# Patient Record
Sex: Male | Born: 1960
Health system: Southern US, Community
[De-identification: ages and names within clinical notes are randomized; demographics above are authoritative.]

## PROBLEM LIST (undated history)

## (undated) DIAGNOSIS — I459 Conduction disorder, unspecified: Secondary | ICD-10-CM

## (undated) DIAGNOSIS — H269 Unspecified cataract: Secondary | ICD-10-CM

## (undated) DIAGNOSIS — J4520 Mild intermittent asthma, uncomplicated: Secondary | ICD-10-CM

## (undated) DIAGNOSIS — N419 Inflammatory disease of prostate, unspecified: Secondary | ICD-10-CM

## (undated) DIAGNOSIS — R112 Nausea with vomiting, unspecified: Secondary | ICD-10-CM

## (undated) DIAGNOSIS — Z950 Presence of cardiac pacemaker: Secondary | ICD-10-CM

## (undated) DIAGNOSIS — E119 Type 2 diabetes mellitus without complications: Secondary | ICD-10-CM

## (undated) DIAGNOSIS — Z8719 Personal history of other diseases of the digestive system: Secondary | ICD-10-CM

## (undated) DIAGNOSIS — T7840XA Allergy, unspecified, initial encounter: Secondary | ICD-10-CM

## (undated) DIAGNOSIS — Z87442 Personal history of urinary calculi: Secondary | ICD-10-CM

## (undated) DIAGNOSIS — N189 Chronic kidney disease, unspecified: Secondary | ICD-10-CM

## (undated) DIAGNOSIS — N184 Chronic kidney disease, stage 4 (severe): Secondary | ICD-10-CM

## (undated) DIAGNOSIS — F209 Schizophrenia, unspecified: Secondary | ICD-10-CM

## (undated) DIAGNOSIS — R0989 Other specified symptoms and signs involving the circulatory and respiratory systems: Secondary | ICD-10-CM

## (undated) DIAGNOSIS — I2510 Atherosclerotic heart disease of native coronary artery without angina pectoris: Secondary | ICD-10-CM

## (undated) DIAGNOSIS — G629 Polyneuropathy, unspecified: Secondary | ICD-10-CM

## (undated) DIAGNOSIS — E782 Mixed hyperlipidemia: Secondary | ICD-10-CM

## (undated) DIAGNOSIS — R600 Localized edema: Secondary | ICD-10-CM

## (undated) DIAGNOSIS — F419 Anxiety disorder, unspecified: Secondary | ICD-10-CM

## (undated) DIAGNOSIS — I4892 Unspecified atrial flutter: Secondary | ICD-10-CM

## (undated) DIAGNOSIS — I219 Acute myocardial infarction, unspecified: Secondary | ICD-10-CM

## (undated) DIAGNOSIS — D649 Anemia, unspecified: Secondary | ICD-10-CM

## (undated) DIAGNOSIS — I495 Sick sinus syndrome: Secondary | ICD-10-CM

## (undated) DIAGNOSIS — I447 Left bundle-branch block, unspecified: Secondary | ICD-10-CM

## (undated) DIAGNOSIS — J449 Chronic obstructive pulmonary disease, unspecified: Secondary | ICD-10-CM

## (undated) DIAGNOSIS — F329 Major depressive disorder, single episode, unspecified: Secondary | ICD-10-CM

## (undated) DIAGNOSIS — F32A Depression, unspecified: Secondary | ICD-10-CM

## (undated) DIAGNOSIS — Z9889 Other specified postprocedural states: Secondary | ICD-10-CM

## (undated) DIAGNOSIS — K219 Gastro-esophageal reflux disease without esophagitis: Secondary | ICD-10-CM

## (undated) DIAGNOSIS — L509 Urticaria, unspecified: Secondary | ICD-10-CM

## (undated) DIAGNOSIS — J069 Acute upper respiratory infection, unspecified: Secondary | ICD-10-CM

## (undated) DIAGNOSIS — I10 Essential (primary) hypertension: Secondary | ICD-10-CM

## (undated) DIAGNOSIS — Z45018 Encounter for adjustment and management of other part of cardiac pacemaker: Secondary | ICD-10-CM

## (undated) HISTORY — DX: Unspecified atrial flutter: I48.92

## (undated) HISTORY — DX: Chronic obstructive pulmonary disease, unspecified: J44.9

## (undated) HISTORY — DX: Personal history of other diseases of the digestive system: Z87.19

## (undated) HISTORY — DX: Mixed hyperlipidemia: E78.2

## (undated) HISTORY — DX: Allergy, unspecified, initial encounter: T78.40XA

## (undated) HISTORY — PX: TONSILLECTOMY: SUR1361

## (undated) HISTORY — DX: Unspecified cataract: H26.9

## (undated) HISTORY — DX: Acute upper respiratory infection, unspecified: J06.9

## (undated) HISTORY — PX: UPPER GASTROINTESTINAL ENDOSCOPY: SHX188

## (undated) HISTORY — DX: Chronic kidney disease, stage 4 (severe): N18.4

## (undated) HISTORY — PX: COLONOSCOPY: SHX174

## (undated) HISTORY — DX: Schizophrenia, unspecified: F20.9

## (undated) HISTORY — PX: ADENOIDECTOMY: SUR15

## (undated) HISTORY — DX: Anxiety disorder, unspecified: F41.9

## (undated) HISTORY — DX: Inflammatory disease of prostate, unspecified: N41.9

## (undated) HISTORY — PX: APPENDECTOMY: SHX54

## (undated) HISTORY — DX: Other specified symptoms and signs involving the circulatory and respiratory systems: R09.89

## (undated) HISTORY — DX: Localized edema: R60.0

## (undated) HISTORY — DX: Urticaria, unspecified: L50.9

## (undated) HISTORY — DX: Mild intermittent asthma, uncomplicated: J45.20

## (undated) HISTORY — DX: Left bundle-branch block, unspecified: I44.7

## (undated) HISTORY — DX: Polyneuropathy, unspecified: G62.9

## (undated) NOTE — Telephone Encounter (Signed)
 Formatting of this note might be different from the original. Call to Clerence to let him know inhaler refill was sent in. He appreciated call, denied any questions  Electronically signed by Fizell, Allison T, RN at 10/16/2023 10:07 AM CDT

## (undated) NOTE — Telephone Encounter (Signed)
 Formatting of this note might be different from the original. Interventional Radiology Procedure Review/Workup  Procedure: Renal Biopsy  Indication: Need left renal biopsy, concerning for renal cell carcinoma   Requesting Provider: Willy Menghini, DO   Pertinent History: Hepatitis C s/p treatment with IFN 2002, chronic pancreatitis, CKD, CAD on ASA, GERD, barrett's esophagus, Asthma, Infrarenal abdominal aortic aneurysm (AAA) without rupture.   Labs: 10/18/23 - CBC and CMP   Imaging: 10/29/23 - MRI abdomen done at [facility] and is in PACS.    Blood thinners and indication: None found on chart review  Patient screening via chart review/contact patient if clarification needed Consent: Self Able to lay/positioning for procedure: Unknown  Blood Thinners: None found on chart review Sleep apnea: None found on chart review Contrast allergy: None found on chart review  Any other special considerations: Referring provider placed order Interventional Radiology Consult, writer is not sure this is correct order.   Will review with IR APP  Electronically signed by Emmaline Almarie LABOR, RN at 11/07/2023 10:04 AM CDT

## (undated) NOTE — Progress Notes (Signed)
 Formatting of this note might be different from the original. During patient report with MA, writer was told that patient had a bad experience during our last visit. Stating that he was told that the provider was happy that he had cancer or wished that he had cancer and that he should go back to [place]  to die. This was addressed in the office and patient stated that this writer was indeed the person that said those statements to him. Pt was advised that this did not happen and his recollection is not accurate. We reviewed the notes from previous visit together. He is believing someone impersonated the doctor and made inappropriate comments. It was discussed that if he is feeling uncomfortable then it would be advised that he follow up with a different physician. Pt stated that I think I might have gotten it wrong and I apologize He stated you have always been nice to me that why I was confused.   Writer raised concerns of pt mental health. Pt stated that he is planning on seeing a [facility] psychiatrist , but has not scheduled appointment. He stated that he is taking wellbutrin  and invega .  Electronically signed by Arnita Mage, DO at 12/19/2023 11:27 AM CDT

## (undated) NOTE — Anesthesia Postprocedure Evaluation (Signed)
 Formatting of this note is different from the original.                                                 Post Anesthesia Evaluation   Patient: Peter Marsh   Procedure(s): Endo - Esophagogastroduodenoscopy Endo - Colonoscopy   Anesthesia Type: general, MAC  Last Vitals:  Vitals Value Taken Time  BP 146/105 01/01/24 08:56  Temp wnl 01/01/24 08:59  Pulse 60 01/01/24 08:56  Resp 17 01/01/24 08:56  SpO2 98 % 01/01/24 08:56  Vitals shown include unfiled device data.    Anesthesia Post Evaluation: Patient location during evaluation: Endoscopy Handoff Report Given To:  Floor nurse Patient participation: patient participated (w/n) Level of consciousness: alert and awake Pain management: adequate Airway patency: patent Anesthetic complications: no PONV present: no Cardiovascular status: hemodynamically stable Respiratory status: spontaneous ventilation and nasal cannula Hydration status: euvolemic  Discharge(d) when criteria met    Electronically signed by Rulon Delon LABOR, MD at 01/01/2024  9:00 AM CDT

## (undated) NOTE — Telephone Encounter (Signed)
 Formatting of this note might be different from the original. Peter Marsh confirmed he is staying in [place] and was not sure why he has apts scheduled with an [place] cardiologist. Reviewed upcoming apt date and times with him for cardiology and nephrology. Denied any questions.  Spoke with Damian at [facility]. She is asking if Meer could be released to a [place]  [facility]. She asked if we could have has new device clinic use [facility] to update his location. She will keep him at current location until our team follows up with him.   Electronically signed by Sherral Isaiah DASEN, RN at 01/04/2024 11:03 AM CDT

## (undated) NOTE — Telephone Encounter (Signed)
 Formatting of this note is different from the original. Situation Ongoing nausea   Background AAA measuring 4.2 cm Established with [facility] for Barrett's esophagus. Has endoscopy scheduled 01/01/24  Assessment Worsening, intermittent nausea over past week, worse when he first wakes up. Feels like he has to vomit but doesn't.  Feels like he has a knot in top of stomach. Started having tan, almost white, bowel movements on Friday. Has not had these before. Using imodium for ongoing diarrhea. Had a fever of 100-100F on Friday with fatigue. No fever today. Fatigue improving from weekend  Denied chills, abd tenderness, vomiting, dark/bright red stools, weakness, dizziness, weight loss  Recommendations Offered visit today with Dr. Arnita, Unable to come sooner due to medical rides. Encouraged to call back if symptoms change or worsen before tomorrow   Future Appointments  Date Time Provider Department Center  11/27/2023 10:40 AM Arnita Mage, DO [facility]  12/19/2023  8:40 AM Arnita Mage, DO [facility]  01/08/2024 10:00 AM Arnita Mage, DO [facility]    Electronically signed by Sherral Isaiah DASEN, RN at 11/26/2023  9:44 AM CDT

## (undated) NOTE — Telephone Encounter (Signed)
 Formatting of this note might be different from the original. Chart reviewed, refilled. Electronically signed by Lucious Dawna LABOR, MD at 10/16/2023  9:40 AM CDT

## (undated) NOTE — H&P (Signed)
 Formatting of this note is different from the original. Pre-operative Evaluation Date of Evaluation: 12/19/2023  Requesting Surgeon: Dr. Rexford ref. provider found Reason for Surgery:    colonoscopy  Planned Procedure: Barrett's Esophagus  Planned Anesthesia:  unassigned Date of Surgery: 01/01/2024 Location of Surgery: [facility]   Subjective  Peter Marsh is a 89 y.o. male seen today for a pre-operative evaluation.   Peter Marsh's problem list contains:  Problem List[1] In preparation for surgery, the following problems were reviewed in further detail: Hypertension BP Readings from Last 3 Encounters:  12/19/23 134/72  11/27/23 132/70  11/20/23 120/71   Status:  Adequately controlled on current treatment for pre-op purposes   Plan:  No changes needed   Chronic Obstructive Pulmonary Disease On chronic oxygen  therapy: No  There is no [employer] on File  Status  Adequately controlled on current treatment for pre-op purposes   Plan:  No changes needed   Patient denies chest pain or shortness of breath at rest or on exertion; denies orthopnea, paroxysmal nocturnal dyspnea, or marked edema; denies fever, chills, or cough   Pre-Surgical Risk Assessment Tools  Anesthesia concerns in general:  Personal h/o problems with anesthesia:  none known   Family h/o problems with anesthesia:  none known   Intubation issues:   none known   Code Status:    Full Code  Refusal of blood products -   even if needed to save life.   no refusal    Per the Revised Cardiac Risk Index (Circ. 100:1043, 1999), the patient's risk factors for cardiac complications include: none, placing him in: RCI RISK CLASS III (2 risk factors, risk of major cardiac compl. appr. 3.6%).  Functional Capacity   Functional capacity is sufficient for elective surgery, estimated to be at least 4 METS, (e.g.: climb flight a stairs or do yard work).  Sleep Apnea - Risk Assessment tool Low risk for Obstructive Sleep Apnea   Medications:   Outpatient Medications Marked as Taking for the 12/19/23 encounter (Office Visit) with Iseghohi, Eboselumhen, DO:  ?  aspirin  81 MG EC tablet, 81 mg, Oral, Daily ?  Breztri  Aerosphere 160-9-4.8 MCG/ACT inhaler, 2 puff, Inhalation, BID ?  buPROPion  (WELLBUTRIN  SR) 150 MG 12 hr tablet, 150 mg, Oral, BID ?  cloNIDine  (CATAPRES ) 0.1 MG tablet, 0.2 mg, Oral, TID ?  ezetimibe  (ZETIA ) 10 MG tablet, 10 mg, Oral, Daily ?  gabapentin  (NEURONTIN ) 100 MG capsule, 100 mg, Oral, BID ?  gabapentin  (NEURONTIN ) 300 MG capsule, 300 mg, Oral, TID ?  hydrALAZINE  (APRESOLINE ) 50 MG tablet, 50 mg, Oral, TID ?  isosorbide  dinitrate (ISORDIL ) 30 MG tablet, 30 mg, Oral, 4 Times Daily ?  metoclopramide (Reglan) 5 MG tablet, 5 mg, Oral, TID PRN ?  metoprolol  succinate (TOPROL -XL) 100 MG 24 hr tablet, 100 mg, Oral, Daily ?  nitroGLYCERIN  (NITROSTAT ) 0.4 MG sublingual tablet, Place 0.4 mg under the tongue. ?  paliperidone  (INVEGA ) 6 MG 24 hr tablet, Take 6 mg by mouth. 1 tablet in the Am and 1 at Bedtime ?  pantoprazole  (PROTONIX ) 40 MG tablet, 40 mg, Oral, BID  Allergies  Allergen Reactions  ? Amlodipine  Edema and Unknown    Patient reported  ? Methylpyrrolidone Hives    froze the intestine  ? Oxybutynin Chloride Other (See Comments)    Bowel obstruction- frozen intestines  ? Solifenacin Succinate Other (See Comments) and Rash    Bowel obstruction- frozen intestines  Froze the intestine  ? Tamsulosin  Swelling    ankles  ?  Ace Inhibitors Other (See Comments)    Other reaction(s): CKD 4  ? Ciprofloxacin Other (See Comments) and Rash    Unknown   Felt flushed  ? Niacin  Itching, Nausea And Vomiting, Other (See Comments) and Rash    Flushing, itching, tingling   Flushing, itching, tingling  ? Nsaids Other (See Comments)    Other Reaction(s): CKD 4  ? Statins Other (See Comments)    Reports severe reaction but cannot remember exactly what it was  ? Amlodipine  Besylate Other (See Comments)    Swollen Feet   ? Angiotensin Other (See Comments)  ? Oxybutynin Other (See Comments) and Rash    unknown  bowel obst  ? Oxybutynin Chloride Er Other (See Comments)    froze the intestine  ? Solifenacin Other (See Comments)    Froze the intestine  unknown   Histories Past Medical History[1] Past Surgical History[1]  Family History[1]  Social History[1] Review of Systems  Objective  BP 134/72 (BP Location: RUE, BP Position: sitting, BP Cuff Size: Reg)   Pulse 68   Resp 16   Ht 185.4 cm (6' 1)   Wt 90.7 kg (200 lb)   BMI 26.39 kg/m   Physical Exam Vitals and nursing note reviewed.  Constitutional:      General: He is not in acute distress.    Appearance: He is well-developed.  HENT:     Head: Normocephalic and atraumatic.  Eyes:     Conjunctiva/sclera: Conjunctivae normal.  Cardiovascular:     Rate and Rhythm: Normal rate and regular rhythm.     Heart sounds: No murmur heard. Pulmonary:     Effort: Pulmonary effort is normal. No respiratory distress.     Breath sounds: Normal breath sounds.  Abdominal:     Palpations: Abdomen is soft.     Tenderness: There is no abdominal tenderness.  Musculoskeletal:        General: No swelling.     Cervical back: Neck supple.     Right lower leg: Edema (trace) present.     Left lower leg: Edema (trace) present.  Skin:    General: Skin is warm and dry.     Capillary Refill: Capillary refill takes less than 2 seconds.  Neurological:     Mental Status: He is alert.  Psychiatric:        Mood and Affect: Mood normal.   Testing Lab Results  Component Value Date   GLU 95 10/24/2023   CA 8.9 10/24/2023   NA 137 10/24/2023   K 5.5 (H) 10/24/2023   CO2 26 10/24/2023   CL 103 10/24/2023   BUN 20 10/24/2023   CREATININE 2.22 (H) 10/24/2023   ALT 16 10/24/2023   BILITOT 0.3 10/24/2023   AST 21 10/24/2023   PROT 6.8 10/24/2023   ALBUMIN 4.2 10/24/2023   ALKPHOS 93 10/24/2023   Assessment & Plan   1. Barrett's esophagus with  dysplasia   2. Essential hypertension    Peter Marsh is a 41 y.o. male with planned surgery as noted above.    He is of average preoperative risk.  In preparation for surgery the following changes/orders were made:  Medication orders today: No orders of the defined types were placed in this encounter.   Other orders (labs, imaging, referrals): Orders Placed This Encounter  Procedures  ? Comprehensive metabolic panel   Pre-operative Instructions were discussed with the patient & a written copy provided within the After Visit Summary  Medication Instructions  Pre-operative recommendations for  holding medications: Hold Aspirin  and NSAID medication (ibuprofen, Motrin, Aleve, naproxen, diclofenac, etc..) for at least 7 days prior to surgery  Resuming held medications: May resume your normal (pre-surgical) medications unless instructed otherwise    He is directed to follow pre-procedure instructions. He is instructed to notify the office if concerns of significant acute illness.  A copy of this visit note will be sent to the requesting provider:  No ref. provider found    [1] Patient Active Problem List Diagnosis  ? Acute cholecystitis  ? Acute kidney injury superimposed on chronic kidney disease  ? Allergic rhinitis due to pollen  ? Anemia  ? Aneurysm of infrarenal abdominal aorta  ? Asthma-COPD overlap syndrome  ? BPH (benign prostatic hyperplasia)  ? Barrett's esophagus  ? Asthma  ? Chronic diastolic CHF (congestive heart failure)  ? Chronic interstitial cystitis  ? Chronic kidney disease due to hypertension  ? Chronic kidney disease, stage 3b  ? Chronic pancreatitis  ? Closed fracture of triquetral bone of right wrist  ? Coronary artery disease involving native coronary artery of native heart without angina pectoris  ? Gastric ulcer, unspecified as acute or chronic, without hemorrhage or perforation  ? Gait abnormality  ? Essential hypertension  ? History of  adenomatous polyp of colon  ? Herniated lumbar intervertebral disc  ? Hepatitis C antibody positive in blood  ? GERD (gastroesophageal reflux disease)  ? Gastroparesis  ? Hypertensive urgency  ? Hyperlipidemia  ? Hypokalemia  ? Hyperkalemia  ? Hyperglycemia  ? Schizophrenia  [1] No past medical history on file.[1] Past Surgical History: Procedure Laterality Date  ? Cholecystectomy  2025  ? Coronary stent placement    ? Ir biopsy renal  11/20/2023  ? Pacemaker insertion  2018  [1] History reviewed. No pertinent family history.[1] Social History Tobacco Use  ? Smoking status: Former    Types: Cigarettes  ? Smokeless tobacco: Never  Substance Use Topics  ? Drug use: Not Currently   Electronically signed by Arnita Mage, DO at 12/19/2023 11:27 AM CDT

## (undated) NOTE — Telephone Encounter (Signed)
 Formatting of this note might be different from the original. ----- Message from Thom GAILS sent at 11/26/2023  9:12 AM CDT ----- Regarding: nausea Provider: iseghohi, RE: nausea Who Called? self  Is the patient active on MyChart?: No Patient call back number: Preferred Phone: [phone]  Pt has been nausea since last week. Not getting any better.  Please call back. Electronically signed by Sherral Isaiah DASEN, RN at 11/26/2023  9:16 AM CDT

## (undated) NOTE — Telephone Encounter (Signed)
 Formatting of this note is different from the original. Breztri  Aerosphere 160 mcg-42mcg-4.8mcg/actuation HFA aerosol inhaler INHALE 2 PUFFS BY MOUTH IN THE MORNING AND AT BEDTIME     active   Not Available Not Available Not Available  Confirmed dose with patient   [facility] Medication Refill Protocols    Patient:  Requested Prescriptions   Pending Prescriptions Disp Refills   Breztri  Aerosphere 160-9-4.8 MCG/ACT inhaler      Sig: Inhale 2 (two) puffs into the lungs 2 (two) times daily. INTO THE LUNGS   Date(s) of last Refill: 07/12/2023 with previous provider   Last Office Visit with this provider: 10/03/2023 Arnita Mage, DO  Last Preventative Wellness Exam:  10/03/23  Next Appointment with this Provider: 01/08/2024 Arnita Mage, DO  All future appointments: Future Appointments  Date Time Provider Department Center  10/29/2023  2:00 PM [facility]2 [facility]  01/08/2024 10:00 AM Iseghohi, Eboselumhen, DO [facility]   Last 3 BP:  BP Readings from Last 3 Encounters:  10/05/23 115/70  10/03/23 (!) 170/110   Last Monitor Labs for medication(s): N/A   Refill status:    Current protocol NOT met due to Rx not ordered in past by this provider. Refill order pended & routed for provider review/approval      Refill message by Allison T Fizell, RN 10/16/2023  Electronically signed by Sherral Isaiah DASEN, RN at 10/16/2023  9:25 AM CDT Electronically signed by Sherral Isaiah DASEN, RN at 10/16/2023  9:26 AM CDT

## (undated) NOTE — Telephone Encounter (Signed)
 Formatting of this note might be different from the original. ----- Message from Thom GAILS sent at 10/16/2023  9:05 AM CDT ----- Regarding: refill -urgent Provider: Iseghohi, RE: refill -urgent Who Called? self  Is the patient active on MyChart?: No Patient call back number: Preferred Phone: [phone]  Need refill urgent. Pt is all out.  Refill to [facility]: 1. Breztri  inhaler for COPD (can't find on medication list)  Please call to confirm correct spelling of inhaler, last refill by outside provider. Electronically signed by Sherral Isaiah DASEN, RN at 10/16/2023  9:19 AM CDT

## (undated) NOTE — Telephone Encounter (Signed)
 Formatting of this note might be different from the original. UPDATE: on chart review writer noted pt on ASA 81 mg daily, and per renal biopsy guidelines : 7 day HOLD is indicated as double verified with Pun,MD .  Writer reached out to [facility] [phone] to be routed to E. Iseghohi,MD to request OKAY to HOLD 7 days/doses of ASA per IR guidelines.  UPDATE: 7/3 @ 1057: Message spoke with Ashley,receptionist whom is routing a message to the RN for Iseghohi,MD to request HOLD - awaiting callback.  Also: Clinical research associate requested Izetta, IR/CT tech to change the internal Interventional Radiology Consult over to a IR renal biopsy order - completed, awaiting ASA callback hold request prior to sending to scheduling.  UPDATE: 7/3 @ 1210: message received from Allison,RN for PCP - Iseghohi,MD - OKAY to HOLD 7 day's of ASA 81 mg for left renal biopsy.  Electronically signed by Glennette Clive CROME, RN at 11/08/2023 10:58 AM CDT Electronically signed by Glennette Clive CROME, RN at 11/08/2023 11:01 AM CDT Electronically signed by Glennette Clive CROME, RN at 11/08/2023 12:29 PM CDT

## (undated) NOTE — Progress Notes (Signed)
 Formatting of this note is different from the original. Situation: Patient present for follow up blood pressure check due to as directed by provider.   Background:  Hx: BP Readings from Last 3 Encounters:  10/05/23 115/70  10/03/23 (!) 170/110   Assessment:  Vitals:   10/05/23 0902 10/05/23 0903  BP: 110/65 115/70   Patient checks blood pressure at home:  no  Readings have been: n/a Symptoms related to hypertension as reported by patient:  none  Has patient used caffeine, alcohol, or tobacco or worked out in the last 60 minutes: no Patient is agreeable to treatment plan change: yes Patient taking medications as prescribed: yes Current blood pressure medications:  -clonidine  0.1 mg tabs. 2 tabs by mouth three times daily  -hydralazine  50 mg tabs. Take 1 tab by mouth three times daily  -metoprolol  ER 100 mg tabs. Take 1 tab by mouth daily   Recommendation:  Patient was advised the following:  See TE from today. Treyvion and brother confirmed heart stent placements in 05/2022 and he stopped Plavix  and does take a baby Asprin. He will pick up baby Asprin script from pharmacy today and start taking it as he ran out of previous supply. Voiced understanding to await call from [facility] cardiology to schedule follow up  Will await call from [facility] to scheduled MRI  Reviewed Dr. Arnita placed repeat blood work- sent to lab after visit  Feeling much better today. Eating and drinking well. Encouraged to call clinic with any concerns. Denied any other questions.    Electronically signed by Sherral Isaiah DASEN, RN at 10/05/2023  9:16 AM CDT

## (undated) NOTE — Telephone Encounter (Signed)
 Formatting of this note might be different from the original. Left detailed message for [facility] reviewing provider recommendations not to release patient until patient establishes with [facility] cardiology on 10/1. Contact information provided if there were any follow up questions  Electronically signed by Fizell, Allison T, RN at 01/11/2024 11:51 AM CDT

## (undated) NOTE — Telephone Encounter (Signed)
 Formatting of this note might be different from the original. Referral to Interventional Radiology   Received request from Iseghohi,MD with the [facility] clinic for a LEFT renal biopsy in this patient with a history of suspected RCC (renal cell carcinoma).  Preliminary review of clinical history, labs, and images with Pun,MD  We will proceed with arranging: LEFT renal mass biopsy   Dx/symptoms: left renal mass  Pre/post procedure location : 10T  NPO: YES  Modality: US  in CT ROOM  If biopsy: CORE  Anticoagulation: ASA 81 mg - 7 DAY HOLD (as he takes once daily) - okay to HOLD per IR HIGH risk specific to renal biopsy guidelines -  okay per Iseghohi,MD  Additional considerations:**internal IR consult order transcribed over to renal biopsy order by KW,IR/CT tech **pt ready to schedule - he needs to HOLD 7 days of Aspiring 81 mg prior to biopsy (let IR RN Coordinator know once scheduled)  Patient will be contacted by scheduling in the next 2-4 business days to arrange procedure.  In the interim, questions can be directed to Interventional Radiology Scheduling at [phone]. Electronically signed by Glennette Clive CROME, RN at 11/08/2023 12:28 PM CDT

## (undated) NOTE — Progress Notes (Signed)
 Formatting of this note might be different from the original. Pre -Op paperwork faxed to [facility] for GI procedure on 01/01/24 [phone] Electronically signed by Timoteo Eleanor SAILOR, CMA at 12/20/2023  3:31 PM CDT

## (undated) NOTE — Progress Notes (Signed)
 Formatting of this note is different from the original. Peter Marsh was given immunization(s) per orders today. *See Immunization Documentation for full details. Peter Marsh tolerated the procedure well.  patient were instructed on monitoring for signs and symptoms of adverse effects and when to seek medical care for any adverse effects.  patient verbalized understanding.  Immunizations Administered on Date of Encounter - 02/15/2024     Name Date Dose Route   COVID-19 02/15/2024 0.5 mL Intramuscular   Influenza (FLUAD) aIIV3 02/15/2024 0.5 mL Intramuscular     Electronically signed by Tharon Evan PARAS, CMA at 02/15/2024  8:51 PM CDT

## (undated) NOTE — Progress Notes (Signed)
 Formatting of this note is different from the original. Subjective:    Peter Marsh is a 17 y.o. male here to discuss his colonoscopy results.  He was previously followed by Arnita Mage, DO.  States he is not sure why he is here today--states someone told him to come in and discuss his colonoscopy results.  He denies questions.   Colonoscopy results reviewed--repeat in 3 months recommended due to incomplete prep.  He has been called to schedule, but he declined.  States he is planning to call back to schedule.  He also had EGD for Barrett's esophagus done--biopsies negative for dysplasia.  States he has history of schizophrenia and bipolar.  He has therapist and psychiatrist locally--appt in November.  Moved from [place]  to be closer to his brother.  Healthcare maintenance:     Tetanus booster: 2012--recommend he get at the pharmacy Pneumovax: 2013 PCV 20: 2023 Shingrix: recommend he get at the pharmacy RSV: recommend he get at the pharmacy COVID vaccine: today Flu shot: today Fasting labs: see below,  Cholesterol  Date Value Ref Range Status  10/03/2023 220 (H) 0 - 200 mg/dL Final   HDL Cholesterol  Date Value Ref Range Status  10/03/2023 34 (L) >40 mg/dL Final    Comment:    RESULTS MAY BE AFFECTED BY PLUS OR MINUS 10 PERCENT IF TRIGLYCERIDE VALUE IS GREATER THAN 1199 mg/dL.   LDL, Calculated  Date Value Ref Range Status  10/03/2023 162 (H) <100 mg/dL Final    Comment:    Optimal         <100 mg/dL Near Optimal    899-870 mg/dL Borderline High 869-840 mg/dL High            839-810 mg/dL Very High       >809 mg/dL   VLDL Cholesterol  Date Value Ref Range Status  10/03/2023 24 mg/dL Final   Triglycerides  Date Value Ref Range Status  10/03/2023 118 <150 mg/dL Final   Cholesterol/HDL Ratio  Date Value Ref Range Status  10/03/2023 6.5 (H) 1.5 - 6.3 Final    Comment:    Testing performed at [facility], [address] [place], [place] [address],  unless otherwise stated in result.   Glucose  Date Value Ref Range Status  12/19/2023 100 (H) 70 - 99 mg/dL Final   PSA: no recent results available. AAA screening: CT 09/2023: Abdominal aortic aneurysm with intramural thrombus measuring up to 4.2 cm.  Seen by Vascular Surgery 11/2023--follow up in 18 months recommended.  Colon cancer screening: As above  PMH/PSH/SH/FH are reviewed and updated in Epic.  Medications/allergies are reviewed and updated in Epic.  Objective:    Vitals:   02/15/24 0837  BP: (!) 151/94  BP Location: right upper extremity  BP Position: sitting  BP Cuff Size: Regular (Adult)  Pulse: 72  Weight: 87.5 kg (193 lb)    Wt Readings from Last 3 Encounters:  02/15/24 87.5 kg (193 lb)  12/25/23 87.1 kg (192 lb 0.3 oz)  12/19/23 90.7 kg (200 lb)   General:  Alert, well appearing and in no distress.  CV: Regular rate and rhythm.  Lungs:  Clear to auscultation bilaterally.  Extremities:  No CCE.  Assessment/Plan:  1.  HCM.  COVID booster and flu shot today.  Immunizations reviewed, recommend he get Shingrix and RSV at the pharmacy.  Colonoscopy reviewed with patient, encouraged to schedule repeat due to incomplete prep--contact information provided.  MCW exam scheduled. 2.  Barrett's esophagus,  without dysplasis.  He had EGD completed with colonoscopy,no dysplasia.  He is on pantoprazole  twice daily 3.  Follow up yearly for [facility], sooner with acute concerns.  I spent a total of 35 minutes on 02/15/2024 reviewing the record, performing a face to face visit, preparing, discussing the treatment, and creating documentation of the encounter. This time was separate from that spent performing other billable services.   Electronically signed by Lucious Dawna LABOR, MD at 02/15/2024  8:51 PM CDT

## (undated) NOTE — Telephone Encounter (Signed)
 Formatting of this note might be different from the original. IR Procedure Request Ordering Provider name: Eboselumhen Iseghohi, DO Type of Procedure: Interventional Radiology Consult for renal biopsy Body Region:  DX Code with wording: N28.89  Renal Mass  Order comment:  Need left renal biopsy, concerning for renal cell carcinoma.   MRI abdomen done at [facility] 10/29/23 and is on PACS.   Please review and provide scheduling instructions.  Thank you,  Erin Electronically signed by Donney Longs, Tech at 11/07/2023  9:05 AM CDT

## (undated) NOTE — Telephone Encounter (Signed)
 Formatting of this note might be different from the original. ----- Message from Kate HERO sent at 01/02/2024  1:40 PM CDT ----- Regarding: pacemaker information Provider: Iseghohi, RE: pacemaker info Who Called? Leigh, (from clinic in [place])  Is the patient active on MyChart?: No Patient call back number: [phone]  Patient used to live in [place] and Anette said that they are getting his pacemaker information.   She said it needs to get switched to whoever here will be monitoring it.  You can call her back today, but if it's tomorrow, anyone there can help you.  Please follow up with caller, thank you Electronically signed by Fizell, Allison T, RN at 01/02/2024  1:49 PM CDT

## (undated) NOTE — Telephone Encounter (Signed)
 Formatting of this note might be different from the original. Call to [place] to confirm he is moving back to  - mentioned this at a previous visit and has a cardiology apt on 9/1 with [facility] [employer]  LVM with request to call back  Electronically signed by Sherral Isaiah DASEN, RN at 01/02/2024  1:50 PM CDT

## (undated) NOTE — Anesthesia Preprocedure Evaluation (Signed)
 Formatting of this note is different from the original. Images from the original note were not included.                        Anesthesia Preoperative Evaluation   History of Present Illness  Peter Marsh is a 72 y.o. male scheduled for Endo - Esophagogastroduodenoscopy Endo - Colonoscopy with Rico Maude MATSU, MD.    Allergy  Allergies Allergen Reactions  ? Amlodipine  Edema and Unknown    Patient reported  ? Methylpyrrolidone Hives    froze the intestine  ? Oxybutynin Chloride Other (See Comments)    Bowel obstruction- frozen intestines  ? Solifenacin Succinate Other (See Comments) and Rash    Bowel obstruction- frozen intestines  Froze the intestine  ? Tamsulosin  Swelling    ankles  ? Ace Inhibitors Other (See Comments)    Other reaction(s): CKD 4  ? Ciprofloxacin Other (See Comments) and Rash    Unknown   Felt flushed  ? Niacin  Itching, Nausea And Vomiting, Other (See Comments) and Rash    Flushing, itching, tingling   Flushing, itching, tingling  ? Nsaids Other (See Comments)    Other Reaction(s): CKD 4  ? Statins Other (See Comments)    Reports severe reaction but cannot remember exactly what it was  ? Amlodipine  Besylate Other (See Comments)    Swollen Feet  ? Angiotensin Other (See Comments)  ? Oxybutynin Other (See Comments) and Rash    unknown  bowel obst  ? Oxybutynin Chloride Er Other (See Comments)    froze the intestine  ? Solifenacin Other (See Comments)    Froze the intestine  unknown    Medications  Prior to Admission Medications         Last Dose    albuterol  108 (90 Base) MCG/ACT inhaler  --    aspirin  81 MG EC tablet  --    Take 1 (one) tablet (81 mg total) by mouth daily.    Breztri  Aerosphere 160-9-4.8 MCG/ACT inhaler  --    INHALE 2 PUFFS BY MOUTH INTO THE LUNGS TWICE DAILY    buPROPion  (WELLBUTRIN  SR) 150 MG 12 hr tablet  --    Take 1 (one) tablet (150 mg total) by mouth 2 (two) times daily.    cloNIDine  (CATAPRES ) 0.1 MG  tablet  --    Take 2 (two) tablets (0.2 mg total) by mouth 3 (three) times daily.    ezetimibe  (ZETIA ) 10 MG tablet  --    Take 1 (one) tablet (10 mg total) by mouth daily.    gabapentin  (NEURONTIN ) 100 MG capsule  --    gabapentin  (NEURONTIN ) 300 MG capsule  --    hydrALAZINE  (APRESOLINE ) 50 MG tablet  --    Take 1 (one) tablet (50 mg total) by mouth 3 (three) times daily.    isosorbide  dinitrate (ISORDIL ) 30 MG tablet  --    Take 1 (one) tablet (30 mg total) by mouth 4 (four) times daily.    metoclopramide (Reglan) 5 MG tablet  --    Take 1 (one) tablet (5 mg total) by mouth 3 (three) times daily as needed (nausea).    metoprolol  succinate (TOPROL -XL) 100 MG 24 hr tablet  --    Take 1 (one) tablet (100 mg total) by mouth daily.    nitroGLYCERIN  (NITROSTAT ) 0.4 MG sublingual tablet  --    paliperidone  (INVEGA ) 6 MG 24 hr tablet  --    pantoprazole  (PROTONIX ) 40  MG tablet  --    Take 1 (one) tablet (40 mg total) by mouth 2 (two) times daily.     Current Medications[1]   Inpatient Beta Blocker Administrations (last 72 hours)     None      Anesthesia ROS/Medical History  Past medical history was reviewed. Patient/Family Anesthetic History: No history of anesthetic complications. No family history of anesthesia complications. Pulmonary: Positive for former tobacco use. PAST MEDICAL HISTORY  Cardiovascular: CHF, NSTEMI (stent), second degree AV block, LBBB, paroxysmal atrial flutter, pacemaker (note reviewed), HTN, no recent nitroglycerin  use. Physical activity level predicts >4 MET without symptoms Pulmonary: Asthma-COPD with 1-2 x/weekly albuterol  inhaler use. No recent URI. Gastrointestinal: GERD well controlled Renal/Urological: hx kidney stones, CKD Neurological: neuropathy, stroke (2018, no residual symptoms) Endocrine: DM Hematological/Anticoagulant Therapy: Not currently taking anticoagulants or known coagulopathy  Psychiatric/Mental Health: depression Other Significant  Hx: Body mass index is 26.04 kg/m. NPO: NPO per protocol (8hrs for solids, 2 hrs for clear liquids).  Objections to transfusion: No    Surgical History  Past Surgical History: Procedure Laterality Date  ? Adenoidectomy    ? Appendectomy    ? Cholecystectomy  2025  ? Coronary stent placement    ? Ir biopsy renal  11/20/2023  ? Lumbar laminectomy    ? Pacemaker insertion  2018  ? Renal biopsy    ? Tonsillectomy      Physical Exam    Body mass index is 26.04 kg/m.  Airway Exam:  Mallampati Class is II Thyromental distance is >3 FB Neck range of motion is full  Dental Exam:        Cardiovascular Exam: Cardiovascular exam is normal. Rhythm is regular. Rate is normal.   Pulmonary Exam:  Breath sounds clear to ascultation.       NPO Status       Social History  Social History[2]    Substance and Sexual Activity  Drug Use Not Currently    Most Recent Lab Results  CBC (last 30 days)  No results for input(s): WBC, RBC, HGB, HCT, MCV, MCH, MCHC, RDW, PLT, NRBCABS in the last 720 hours.  Basic Metabolic Panel (last 30 days)  Recent Labs    12/19/23 0910  NA 138  K 4.7  CL 104  CO2 23  GLU 100*  BUN 24*  CREATININE 1.97*  CA 9.3  ANIONGAP 11  BCR 12.2  OSMOLALITY 290  EGFRNAA 37*   Type & Screen (last 72 hours)  No results for input(s): ABORH, LABANTI, ABSCREENGEL in the last 72 hours.  Pregnancy (if applicable) (last 72 hours)  No results for input(s): HCGSCREEN, URHCG, POCURHCG in the last 72 hours.  POC Glucose (last 24 hours)  No results for input(s): POCGMD, POCGLU in the last 24 hours.    Diagnostic/Imaging Results:  EKG (2025): Atrial-paced rhythm with prolonged AV conduction  Left bundle branch block  Abnormal ECG  When compared with ECG of 03-Oct-2023 15:51,  T wave inversion less evident in Lateral leads  Confirmed by January, Craig ([id number]) on 12/25/2023 11:03:29 AM      Anesthesia Plan:  ASA  Score: 3   Plan: General and MAC. Induction: Intravenous.  Informed Consent: Anesthetic plan and postop analgesia plan discussed with patient. Anesthesia options and risks explained to patient, All questions answered and Patient (or parent/guardian) appears to have understood our discussion and informed consent obtained.  Risks Discussed: Risks, benefits, and alternatives of the general anesthetic and/or monitored anesthesia care (MAC) plan were  discussed with the patient. The risks of general anesthesia and/or MAC were explained including, but not limited to - cardiopulmonary events, PONV, aspiration, dental trauma, sore throat, neuropathies and visual changes.  Medical Necessities: Patient is deemed to be ASA status 3 or greater due to following medical reasons: pacemaker. Monitored anesthesia care is being requested by Rico, MD due to following medical reasons:  Painful proceedure, poorly tolerated.      [1] No current facility-administered medications for this encounter.  [2]  Tobacco Use  ? Smoking status: Former    Types: Cigarettes  ? Smokeless tobacco: Never  Substance Use Topics  ? Alcohol use: Never  ? Drug use: Not Currently  Electronically signed by Rulon Delon LABOR, MD at 01/01/2024  7:48 AM CDT

---
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
## 1999-05-09 DIAGNOSIS — Z8711 Personal history of peptic ulcer disease: Secondary | ICD-10-CM

## 1999-05-09 HISTORY — DX: Personal history of peptic ulcer disease: Z87.11

## 2004-12-23 ENCOUNTER — Ambulatory Visit: Payer: Self-pay | Admitting: Nurse Practitioner

## 2005-01-09 ENCOUNTER — Ambulatory Visit: Payer: Self-pay | Admitting: Gastroenterology

## 2005-01-15 ENCOUNTER — Emergency Department (HOSPITAL_COMMUNITY): Admission: EM | Admit: 2005-01-15 | Discharge: 2005-01-15 | Payer: Self-pay | Admitting: Emergency Medicine

## 2005-03-01 ENCOUNTER — Ambulatory Visit: Payer: Self-pay | Admitting: Nurse Practitioner

## 2005-03-15 ENCOUNTER — Ambulatory Visit: Payer: Self-pay | Admitting: Nurse Practitioner

## 2005-04-13 ENCOUNTER — Ambulatory Visit (HOSPITAL_COMMUNITY): Admission: RE | Admit: 2005-04-13 | Discharge: 2005-04-13 | Payer: Self-pay | Admitting: *Deleted

## 2005-05-15 ENCOUNTER — Ambulatory Visit: Payer: Self-pay | Admitting: Nurse Practitioner

## 2005-05-16 ENCOUNTER — Ambulatory Visit (HOSPITAL_COMMUNITY): Admission: RE | Admit: 2005-05-16 | Discharge: 2005-05-16 | Payer: Self-pay | Admitting: Nurse Practitioner

## 2005-05-17 ENCOUNTER — Ambulatory Visit: Payer: Self-pay | Admitting: Nurse Practitioner

## 2005-09-19 ENCOUNTER — Ambulatory Visit: Payer: Self-pay | Admitting: Nurse Practitioner

## 2005-09-20 ENCOUNTER — Ambulatory Visit (HOSPITAL_COMMUNITY): Admission: RE | Admit: 2005-09-20 | Discharge: 2005-09-20 | Payer: Self-pay | Admitting: Nurse Practitioner

## 2005-10-09 ENCOUNTER — Emergency Department (HOSPITAL_COMMUNITY): Admission: EM | Admit: 2005-10-09 | Discharge: 2005-10-09 | Payer: Self-pay | Admitting: Emergency Medicine

## 2007-11-15 DIAGNOSIS — M545 Low back pain, unspecified: Secondary | ICD-10-CM | POA: Insufficient documentation

## 2008-03-06 ENCOUNTER — Emergency Department (HOSPITAL_COMMUNITY): Admission: EM | Admit: 2008-03-06 | Discharge: 2008-03-06 | Payer: Self-pay | Admitting: Emergency Medicine

## 2008-03-07 ENCOUNTER — Emergency Department (HOSPITAL_COMMUNITY): Admission: EM | Admit: 2008-03-07 | Discharge: 2008-03-07 | Payer: Self-pay | Admitting: Emergency Medicine

## 2008-08-14 DIAGNOSIS — Z87891 Personal history of nicotine dependence: Secondary | ICD-10-CM | POA: Insufficient documentation

## 2008-11-20 ENCOUNTER — Ambulatory Visit (HOSPITAL_COMMUNITY): Admission: RE | Admit: 2008-11-20 | Discharge: 2008-11-20 | Payer: Self-pay | Admitting: Gastroenterology

## 2008-11-24 DIAGNOSIS — K3184 Gastroparesis: Secondary | ICD-10-CM | POA: Insufficient documentation

## 2009-04-20 DIAGNOSIS — E785 Hyperlipidemia, unspecified: Secondary | ICD-10-CM | POA: Insufficient documentation

## 2010-01-10 ENCOUNTER — Ambulatory Visit: Payer: Self-pay | Admitting: Gastroenterology

## 2010-01-10 ENCOUNTER — Inpatient Hospital Stay (HOSPITAL_COMMUNITY)
Admission: EM | Admit: 2010-01-10 | Discharge: 2010-01-13 | Payer: Self-pay | Source: Home / Self Care | Admitting: Emergency Medicine

## 2010-07-21 LAB — BASIC METABOLIC PANEL
BUN: 3 mg/dL — ABNORMAL LOW (ref 6–23)
BUN: 4 mg/dL — ABNORMAL LOW (ref 6–23)
CO2: 22 mEq/L (ref 19–32)
Calcium: 8.4 mg/dL (ref 8.4–10.5)
Calcium: 8.5 mg/dL (ref 8.4–10.5)
Chloride: 111 mEq/L (ref 96–112)
Creatinine, Ser: 1.06 mg/dL (ref 0.4–1.5)
Creatinine, Ser: 1.21 mg/dL (ref 0.4–1.5)
Creatinine, Ser: 1.32 mg/dL (ref 0.4–1.5)
GFR calc Af Amer: >60 mL/min (ref >60)
GFR calc Af Amer: >60 mL/min (ref >60)
GFR calc Af Amer: >60 mL/min (ref >60)
GFR calc non Af Amer: >60 mL/min (ref >60)
Potassium: 4.4 mEq/L (ref 3.5–5.1)

## 2010-07-21 LAB — CK TOTAL AND CKMB (NOT AT ARMC)
CK, MB: 1 ng/mL (ref 0.3–4.0)
Total CK: 73 U/L (ref 7–232)

## 2010-07-21 LAB — COMPREHENSIVE METABOLIC PANEL
ALT: 15 U/L (ref 0–53)
Albumin: 4.2 g/dL (ref 3.5–5.2)
Alkaline Phosphatase: 65 U/L (ref 39–117)
Glucose, Bld: 97 mg/dL (ref 70–99)
Potassium: 3.5 mEq/L (ref 3.5–5.1)
Sodium: 140 mEq/L (ref 135–145)
Total Protein: 7.2 g/dL (ref 6.0–8.3)

## 2010-07-21 LAB — CBC
HCT: 38.1 % — ABNORMAL LOW (ref 39.0–52.0)
HCT: 41.7 % (ref 39.0–52.0)
Hemoglobin: 14.3 g/dL (ref 13.0–17.0)
MCV: 88.3 fL (ref 78.0–100.0)
Platelets: 158 10*3/uL (ref 150–400)
Platelets: 176 10*3/uL (ref 150–400)
RBC: 4.22 MIL/uL (ref 4.22–5.81)
RBC: 4.3 MIL/uL (ref 4.22–5.81)
RBC: 4.69 MIL/uL (ref 4.22–5.81)
RDW: 12.5 % (ref 11.5–15.5)
RDW: 12.7 % (ref 11.5–15.5)
RDW: 12.8 % (ref 11.5–15.5)
WBC: 6.2 10*3/uL (ref 4.0–10.5)
WBC: 6.6 10*3/uL (ref 4.0–10.5)
WBC: 6.7 10*3/uL (ref 4.0–10.5)
WBC: 6.9 10*3/uL (ref 4.0–10.5)

## 2010-07-21 LAB — DIFFERENTIAL
Basophils Absolute: 0 10*3/uL (ref 0.0–0.1)
Basophils Relative: 0 % (ref 0–1)
Eosinophils Absolute: 0.7 10*3/uL (ref 0.0–0.7)
Eosinophils Relative: 7 % — ABNORMAL HIGH (ref 0–5)
Lymphocytes Relative: 27 % (ref 12–46)
Monocytes Absolute: 0.7 10*3/uL (ref 0.1–1.0)
Monocytes Relative: 11 % (ref 3–12)
Neutro Abs: 3.5 10*3/uL (ref 1.7–7.7)
Neutrophils Relative %: 46 % (ref 43–77)
Neutrophils Relative %: 54 % (ref 43–77)

## 2010-07-21 LAB — HEMOGLOBIN AND HEMATOCRIT, BLOOD
HCT: 35.3 % — ABNORMAL LOW (ref 39.0–52.0)
HCT: 37.6 % — ABNORMAL LOW (ref 39.0–52.0)
Hemoglobin: 12.7 g/dL — ABNORMAL LOW (ref 13.0–17.0)
Hemoglobin: 13 g/dL (ref 13.0–17.0)
Hemoglobin: 13.6 g/dL (ref 13.0–17.0)

## 2010-07-21 LAB — ABO/RH: ABO/RH(D): A NEG

## 2010-07-21 LAB — HEMOCCULT GUIAC POC 1CARD (OFFICE): Fecal Occult Bld: NEGATIVE

## 2010-07-21 LAB — MAGNESIUM: Magnesium: 1.8 mg/dL (ref 1.5–2.5)

## 2010-07-21 LAB — TROPONIN I: Troponin I: 0.02 ng/mL (ref 0.00–0.06)

## 2011-02-07 LAB — COMPREHENSIVE METABOLIC PANEL
ALT: 18
AST: 21
Alkaline Phosphatase: 67
CO2: 24
Chloride: 106
GFR calc Af Amer: >60
GFR calc non Af Amer: >60
Sodium: 136
Total Bilirubin: 0.6

## 2011-02-07 LAB — URINALYSIS, ROUTINE W REFLEX MICROSCOPIC
Hgb urine dipstick: NEGATIVE
Nitrite: NEGATIVE
Specific Gravity, Urine: 1.008
Urobilinogen, UA: 0.2

## 2011-02-07 LAB — CBC
MCV: 90.4
RBC: 4.72
WBC: 7

## 2011-02-07 LAB — LIPASE, BLOOD: Lipase: 19

## 2011-02-07 LAB — DIFFERENTIAL
Basophils Absolute: 0
Eosinophils Absolute: 0.3
Eosinophils Relative: 5

## 2011-04-13 ENCOUNTER — Encounter (HOSPITAL_COMMUNITY): Payer: Self-pay | Admitting: Pharmacy Technician

## 2011-04-17 ENCOUNTER — Encounter (HOSPITAL_COMMUNITY): Payer: Self-pay

## 2011-04-17 ENCOUNTER — Ambulatory Visit (HOSPITAL_COMMUNITY)
Admission: RE | Admit: 2011-04-17 | Discharge: 2011-04-17 | Disposition: A | Discharge status: Home / Self Care | Payer: No Typology Code available for payment source | Source: Ambulatory Visit | Attending: Orthopedic Surgery | Admitting: Orthopedic Surgery

## 2011-04-17 ENCOUNTER — Encounter (HOSPITAL_COMMUNITY)
Admission: RE | Admit: 2011-04-17 | Discharge: 2011-04-17 | Disposition: A | Discharge status: Home / Self Care | Payer: No Typology Code available for payment source | Source: Ambulatory Visit | Attending: Orthopedic Surgery | Admitting: Orthopedic Surgery

## 2011-04-17 DIAGNOSIS — M545 Low back pain, unspecified: Secondary | ICD-10-CM | POA: Insufficient documentation

## 2011-04-17 DIAGNOSIS — M5137 Other intervertebral disc degeneration, lumbosacral region: Secondary | ICD-10-CM | POA: Insufficient documentation

## 2011-04-17 DIAGNOSIS — M51379 Other intervertebral disc degeneration, lumbosacral region without mention of lumbar back pain or lower extremity pain: Secondary | ICD-10-CM | POA: Insufficient documentation

## 2011-04-17 DIAGNOSIS — Z01812 Encounter for preprocedural laboratory examination: Secondary | ICD-10-CM | POA: Insufficient documentation

## 2011-04-17 DIAGNOSIS — Z01818 Encounter for other preprocedural examination: Secondary | ICD-10-CM | POA: Insufficient documentation

## 2011-04-17 HISTORY — DX: Chronic kidney disease, unspecified: N18.9

## 2011-04-17 HISTORY — DX: Essential (primary) hypertension: I10

## 2011-04-17 HISTORY — DX: Depression, unspecified: F32.A

## 2011-04-17 HISTORY — DX: Major depressive disorder, single episode, unspecified: F32.9

## 2011-04-17 HISTORY — DX: Gastro-esophageal reflux disease without esophagitis: K21.9

## 2011-04-17 LAB — URINALYSIS, ROUTINE W REFLEX MICROSCOPIC
Bilirubin Urine: NEGATIVE
Ketones, ur: NEGATIVE mg/dL
Nitrite: NEGATIVE
Protein, ur: NEGATIVE mg/dL
Urobilinogen, UA: 0.2 mg/dL (ref 0.0–1.0)

## 2011-04-17 LAB — CBC
HCT: 35.9 % — ABNORMAL LOW (ref 39.0–52.0)
Hemoglobin: 12.2 g/dL — ABNORMAL LOW (ref 13.0–17.0)
MCHC: 34 g/dL (ref 30.0–36.0)
MCV: 87.1 fL (ref 78.0–100.0)

## 2011-04-17 LAB — APTT: aPTT: 33 seconds (ref 24–37)

## 2011-04-17 LAB — PROTIME-INR
INR: 1 (ref 0.00–1.49)
Prothrombin Time: 13.4 seconds (ref 11.6–15.2)

## 2011-04-17 LAB — DIFFERENTIAL
Basophils Relative: 0 % (ref 0–1)
Eosinophils Absolute: 0.3 10*3/uL (ref 0.0–0.7)
Neutro Abs: 4.2 10*3/uL (ref 1.7–7.7)
Neutrophils Relative %: 60 % (ref 43–77)

## 2011-04-17 LAB — COMPREHENSIVE METABOLIC PANEL
Alkaline Phosphatase: 83 U/L (ref 39–117)
BUN: 20 mg/dL (ref 6–23)
Chloride: 101 mEq/L (ref 96–112)
Creatinine, Ser: 1.59 mg/dL — ABNORMAL HIGH (ref 0.50–1.35)
GFR calc Af Amer: 56 mL/min — ABNORMAL LOW (ref >90)
Glucose, Bld: 112 mg/dL — ABNORMAL HIGH (ref 70–99)
Potassium: 4.1 mEq/L (ref 3.5–5.1)
Total Bilirubin: 0.2 mg/dL — ABNORMAL LOW (ref 0.3–1.2)
Total Protein: 7.7 g/dL (ref 6.0–8.3)

## 2011-04-17 MED ORDER — CEFAZOLIN SODIUM 1-5 GM-% IV SOLN: 1.0000 g | INTRAVENOUS | Status: DC

## 2011-04-17 NOTE — Pre-Procedure Instructions (Signed)
Office visit note of 03/17/11 from cardiovascular on chart   Echo done 03/23/11 on chart  03/17/11 EKG on chart  Stress test 03/10/2011 on chart

## 2011-04-17 NOTE — Patient Instructions (Signed)
[place]  04/17/2011   Your procedure is scheduled on:  12/12/120830 am-1030 am  Report to [facility] at [place] AM.  Call this number if you have problems the morning of surgery: [phone]   Remember:   Do not eat food:After Midnight.  May have clear liquids:until Midnight .  Clear liquids include soda, tea, black coffee, apple or grape juice, broth.  Take these medicines the morning of surgery with A SIP OF WATER:    Do not wear jewelry,   Do not wear lotions, powders, or perfumes    Do not bring valuables to the hospital.  Contacts, dentures or bridgework may not be worn into surgery.  Leave suitcase in the car. After surgery it may be brought to your room.  For patients admitted to the hospital, checkout time is 11:00 AM the day of discharge.      Special Instructions: CHG Shower Use Special Wash: 1/2 bottle night before surgery and 1/2 bottle morning of surgery. shower chin to toes with CHG.  Wash face and private parts with  Regular soap.    Please read over the following fact sheets that you were given: MRSA Information, Blood Transfusion Fact Sheet, Incentive Spirometry Fact Sheet , coughing and deep breathing exercises, leg exercises.

## 2011-04-19 ENCOUNTER — Ambulatory Visit (HOSPITAL_COMMUNITY)
Admission: RE | Admit: 2011-04-19 | Discharge: 2011-04-21 | Disposition: A | Discharge status: Home / Self Care | Payer: No Typology Code available for payment source | Source: Ambulatory Visit | Attending: Orthopedic Surgery | Admitting: Orthopedic Surgery

## 2011-04-19 ENCOUNTER — Ambulatory Visit (HOSPITAL_COMMUNITY): Payer: No Typology Code available for payment source | Admitting: Anesthesiology

## 2011-04-19 ENCOUNTER — Encounter (HOSPITAL_COMMUNITY): Payer: Self-pay | Admitting: Anesthesiology

## 2011-04-19 ENCOUNTER — Ambulatory Visit (HOSPITAL_COMMUNITY): Payer: No Typology Code available for payment source

## 2011-04-19 ENCOUNTER — Encounter (HOSPITAL_COMMUNITY): Payer: Self-pay

## 2011-04-19 ENCOUNTER — Encounter (HOSPITAL_COMMUNITY): Payer: Self-pay | Admitting: *Deleted

## 2011-04-19 ENCOUNTER — Other Ambulatory Visit: Payer: Self-pay | Admitting: Orthopedic Surgery

## 2011-04-19 ENCOUNTER — Encounter (HOSPITAL_COMMUNITY)
Admission: RE | Disposition: A | Discharge status: Home / Self Care | Payer: Self-pay | Source: Ambulatory Visit | Attending: Orthopedic Surgery

## 2011-04-19 DIAGNOSIS — M216X9 Other acquired deformities of unspecified foot: Secondary | ICD-10-CM | POA: Insufficient documentation

## 2011-04-19 DIAGNOSIS — R110 Nausea: Secondary | ICD-10-CM | POA: Insufficient documentation

## 2011-04-19 DIAGNOSIS — Z01812 Encounter for preprocedural laboratory examination: Secondary | ICD-10-CM | POA: Insufficient documentation

## 2011-04-19 DIAGNOSIS — M5126 Other intervertebral disc displacement, lumbar region: Secondary | ICD-10-CM

## 2011-04-19 DIAGNOSIS — M48061 Spinal stenosis, lumbar region without neurogenic claudication: Secondary | ICD-10-CM | POA: Insufficient documentation

## 2011-04-19 DIAGNOSIS — M549 Dorsalgia, unspecified: Secondary | ICD-10-CM | POA: Insufficient documentation

## 2011-04-19 DIAGNOSIS — N189 Chronic kidney disease, unspecified: Secondary | ICD-10-CM | POA: Insufficient documentation

## 2011-04-19 DIAGNOSIS — I129 Hypertensive chronic kidney disease with stage 1 through stage 4 chronic kidney disease, or unspecified chronic kidney disease: Secondary | ICD-10-CM | POA: Insufficient documentation

## 2011-04-19 DIAGNOSIS — Q7649 Other congenital malformations of spine, not associated with scoliosis: Secondary | ICD-10-CM | POA: Insufficient documentation

## 2011-04-19 DIAGNOSIS — Z79899 Other long term (current) drug therapy: Secondary | ICD-10-CM | POA: Insufficient documentation

## 2011-04-19 DIAGNOSIS — K219 Gastro-esophageal reflux disease without esophagitis: Secondary | ICD-10-CM | POA: Insufficient documentation

## 2011-04-19 DIAGNOSIS — J45909 Unspecified asthma, uncomplicated: Secondary | ICD-10-CM | POA: Insufficient documentation

## 2011-04-19 HISTORY — PX: LUMBAR LAMINECTOMY/DECOMPRESSION MICRODISCECTOMY: SHX5026

## 2011-04-19 LAB — TYPE AND SCREEN
ABO/RH(D): A NEG
Antibody Screen: NEGATIVE

## 2011-04-19 SURGERY — LUMBAR LAMINECTOMY/DECOMPRESSION MICRODISCECTOMY
Anesthesia: General | Site: Back | Laterality: Left | Wound class: Clean

## 2011-04-19 MED ORDER — NIACIN ER (ANTIHYPERLIPIDEMIC) 500 MG PO TBCR
500.0000 mg | EXTENDED_RELEASE_TABLET | Freq: Every day | ORAL | Status: DC
  Filled 2011-04-19: qty 1

## 2011-04-19 MED ORDER — THROMBIN 5000 UNITS EX KIT
PACK | CUTANEOUS | Status: DC | PRN
  Administered 2011-04-19: 10000 [IU] via TOPICAL

## 2011-04-19 MED ORDER — PROMETHAZINE HCL 25 MG/ML IJ SOLN: 6.2500 mg | INTRAMUSCULAR | Status: DC | PRN

## 2011-04-19 MED ORDER — LACTATED RINGERS IV SOLN
INTRAVENOUS | Status: DC
  Administered 2011-04-19: 1000 mL via INTRAVENOUS
  Administered 2011-04-20 – 2011-04-21 (×3): via INTRAVENOUS

## 2011-04-19 MED ORDER — BUDESONIDE-FORMOTEROL FUMARATE 160-4.5 MCG/ACT IN AERO
2.0000 | INHALATION_SPRAY | Freq: Two times a day (BID) | RESPIRATORY_TRACT | Status: DC
  Administered 2011-04-19 – 2011-04-21 (×4): 2 via RESPIRATORY_TRACT
  Filled 2011-04-19 (×2): qty 6

## 2011-04-19 MED ORDER — MIDAZOLAM HCL 5 MG/5ML IJ SOLN
INTRAMUSCULAR | Status: DC | PRN
  Administered 2011-04-19: 2 mg via INTRAVENOUS

## 2011-04-19 MED ORDER — LOSARTAN POTASSIUM 50 MG PO TABS
100.0000 mg | ORAL_TABLET | Freq: Every day | ORAL | Status: DC
  Administered 2011-04-20 – 2011-04-21 (×2): 100 mg via ORAL
  Filled 2011-04-19 (×3): qty 2

## 2011-04-19 MED ORDER — ROCURONIUM BROMIDE 100 MG/10ML IV SOLN
INTRAVENOUS | Status: DC | PRN
  Administered 2011-04-19: 10 mg via INTRAVENOUS
  Administered 2011-04-19: 50 mg via INTRAVENOUS

## 2011-04-19 MED ORDER — HYDROCHLOROTHIAZIDE 25 MG PO TABS
25.0000 mg | ORAL_TABLET | Freq: Every day | ORAL | Status: DC
  Administered 2011-04-20 – 2011-04-21 (×2): 25 mg via ORAL
  Filled 2011-04-19 (×3): qty 1

## 2011-04-19 MED ORDER — MENTHOL 3 MG MT LOZG
1.0000 | LOZENGE | OROMUCOSAL | Status: DC | PRN
  Filled 2011-04-19: qty 9

## 2011-04-19 MED ORDER — ACETAMINOPHEN 650 MG RE SUPP: 650.0000 mg | RECTAL | Status: DC | PRN

## 2011-04-19 MED ORDER — HYDROMORPHONE HCL PF 1 MG/ML IJ SOLN
0.2500 mg | INTRAMUSCULAR | Status: DC | PRN
  Administered 2011-04-19 (×2): 0.5 mg via INTRAVENOUS

## 2011-04-19 MED ORDER — ACETAMINOPHEN 10 MG/ML IV SOLN
INTRAVENOUS | Status: AC | PRN
  Administered 2011-04-19: 1000 mg via INTRAVENOUS

## 2011-04-19 MED ORDER — LACTATED RINGERS IV SOLN
INTRAVENOUS | Status: DC
  Administered 2011-04-19 (×2): via INTRAVENOUS

## 2011-04-19 MED ORDER — ALBUTEROL SULFATE HFA 108 (90 BASE) MCG/ACT IN AERS
2.0000 | INHALATION_SPRAY | Freq: Four times a day (QID) | RESPIRATORY_TRACT | Status: DC | PRN
  Filled 2011-04-19: qty 6.7

## 2011-04-19 MED ORDER — SUCCINYLCHOLINE CHLORIDE 20 MG/ML IJ SOLN
INTRAMUSCULAR | Status: DC | PRN
  Administered 2011-04-19: 100 mg via INTRAVENOUS

## 2011-04-19 MED ORDER — CEFAZOLIN SODIUM-DEXTROSE 2-3 GM-% IV SOLR
2.0000 g | Freq: Once | INTRAVENOUS | Status: AC
  Administered 2011-04-19: 2 g via INTRAVENOUS

## 2011-04-19 MED ORDER — ACETAMINOPHEN 325 MG PO TABS: 650.0000 mg | ORAL_TABLET | ORAL | Status: DC | PRN

## 2011-04-19 MED ORDER — ONDANSETRON HCL 4 MG/2ML IJ SOLN
4.0000 mg | INTRAMUSCULAR | Status: DC | PRN
  Administered 2011-04-20 (×2): 4 mg via INTRAVENOUS
  Filled 2011-04-19 (×2): qty 2

## 2011-04-19 MED ORDER — BACITRACIN-NEOMYCIN-POLYMYXIN 400-5-5000 EX OINT
TOPICAL_OINTMENT | CUTANEOUS | Status: DC | PRN
  Administered 2011-04-19: 1 via TOPICAL

## 2011-04-19 MED ORDER — EPHEDRINE SULFATE 50 MG/ML IJ SOLN
INTRAMUSCULAR | Status: DC | PRN
  Administered 2011-04-19: 5 mg via INTRAVENOUS

## 2011-04-19 MED ORDER — LOSARTAN POTASSIUM-HCTZ 100-25 MG PO TABS: 1.0000 | ORAL_TABLET | ORAL | Status: DC

## 2011-04-19 MED ORDER — GLYCOPYRROLATE 0.2 MG/ML IJ SOLN
INTRAMUSCULAR | Status: AC | PRN
  Administered 2011-04-19: .8 mg via INTRAVENOUS

## 2011-04-19 MED ORDER — ALUM & MAG HYDROXIDE-SIMETH 200-200-20 MG/5ML PO SUSP
30.0000 mL | Freq: Four times a day (QID) | ORAL | Status: DC | PRN
  Administered 2011-04-20: 30 mL via ORAL
  Filled 2011-04-19: qty 30

## 2011-04-19 MED ORDER — PANTOPRAZOLE SODIUM 40 MG PO TBEC
40.0000 mg | DELAYED_RELEASE_TABLET | Freq: Every day | ORAL | Status: DC
  Administered 2011-04-20 – 2011-04-21 (×2): 40 mg via ORAL
  Filled 2011-04-19 (×3): qty 1

## 2011-04-19 MED ORDER — SUFENTANIL CITRATE 50 MCG/ML IV SOLN
INTRAVENOUS | Status: DC | PRN
  Administered 2011-04-19: 10 ug via INTRAVENOUS
  Administered 2011-04-19: 5 ug via INTRAVENOUS
  Administered 2011-04-19 (×3): 10 ug via INTRAVENOUS
  Administered 2011-04-19: 5 ug via INTRAVENOUS

## 2011-04-19 MED ORDER — LIDOCAINE HCL (CARDIAC) 20 MG/ML IV SOLN
INTRAVENOUS | Status: DC | PRN
  Administered 2011-04-19: 100 mg via INTRAVENOUS

## 2011-04-19 MED ORDER — HYDROMORPHONE HCL PF 1 MG/ML IJ SOLN
0.5000 mg | INTRAMUSCULAR | Status: DC | PRN
  Administered 2011-04-19 – 2011-04-21 (×3): 1 mg via INTRAVENOUS
  Filled 2011-04-19 (×3): qty 1

## 2011-04-19 MED ORDER — SODIUM CHLORIDE 0.9 % IJ SOLN
INTRAMUSCULAR | Status: DC | PRN
  Administered 2011-04-19: 50 mL

## 2011-04-19 MED ORDER — BUPIVACAINE LIPOSOME 1.3 % IJ SUSP
INTRAMUSCULAR | Status: DC | PRN
  Administered 2011-04-19: 20 mL

## 2011-04-19 MED ORDER — BUPIVACAINE LIPOSOME 1.3 % IJ SUSP
20.0000 mL | Freq: Once | INTRAMUSCULAR | Status: DC
  Filled 2011-04-19: qty 20

## 2011-04-19 MED ORDER — PROPOFOL 10 MG/ML IV EMUL
INTRAVENOUS | Status: DC | PRN
  Administered 2011-04-19: 200 mg via INTRAVENOUS

## 2011-04-19 MED ORDER — CEFAZOLIN SODIUM 1-5 GM-% IV SOLN
1.0000 g | Freq: Three times a day (TID) | INTRAVENOUS | Status: AC
  Administered 2011-04-19 – 2011-04-20 (×3): 1 g via INTRAVENOUS
  Filled 2011-04-19 (×4): qty 50

## 2011-04-19 MED ORDER — METHOCARBAMOL 500 MG PO TABS: 500.0000 mg | ORAL_TABLET | Freq: Four times a day (QID) | ORAL | Status: DC | PRN

## 2011-04-19 MED ORDER — METHOCARBAMOL 100 MG/ML IJ SOLN
500.0000 mg | Freq: Four times a day (QID) | INTRAVENOUS | Status: DC | PRN
  Administered 2011-04-19: 500 mg via INTRAVENOUS
  Filled 2011-04-19: qty 5

## 2011-04-19 MED ORDER — NIACIN ER 500 MG PO TBCR
500.0000 mg | EXTENDED_RELEASE_TABLET | Freq: Every day | ORAL | Status: DC
  Administered 2011-04-19 – 2011-04-20 (×2): 500 mg via ORAL
  Filled 2011-04-19 (×4): qty 1

## 2011-04-19 MED ORDER — NEOSTIGMINE METHYLSULFATE 1 MG/ML IJ SOLN
INTRAMUSCULAR | Status: DC | PRN
  Administered 2011-04-19: 5 mg via INTRAVENOUS

## 2011-04-19 MED ORDER — LABETALOL HCL 5 MG/ML IV SOLN
INTRAVENOUS | Status: DC | PRN
  Administered 2011-04-19: 5 mg via INTRAVENOUS

## 2011-04-19 MED ORDER — ONDANSETRON HCL 4 MG/2ML IJ SOLN
INTRAMUSCULAR | Status: DC | PRN
  Administered 2011-04-19: 4 mg via INTRAVENOUS

## 2011-04-19 MED ORDER — OXYCODONE-ACETAMINOPHEN 5-325 MG PO TABS
1.0000 | ORAL_TABLET | ORAL | Status: DC | PRN
  Administered 2011-04-19: 1 via ORAL
  Filled 2011-04-19: qty 1

## 2011-04-19 MED ORDER — PHENOL 1.4 % MT LIQD
1.0000 | OROMUCOSAL | Status: DC | PRN
  Filled 2011-04-19: qty 177

## 2011-04-19 MED ORDER — POVIDONE-IODINE 7.5 % EX SOLN: Freq: Once | CUTANEOUS | Status: DC

## 2011-04-19 MED ORDER — RISPERIDONE MICROSPHERES 25 MG IM SUSR: 25.0000 mg | INTRAMUSCULAR | Status: DC

## 2011-04-19 MED ORDER — SODIUM CHLORIDE 0.9 % IR SOLN
Status: DC | PRN
  Administered 2011-04-19: 10:00:00

## 2011-04-19 MED ORDER — BUPROPION HCL ER (SR) 150 MG PO TB12
150.0000 mg | ORAL_TABLET | ORAL | Status: DC
  Administered 2011-04-20 – 2011-04-21 (×2): 150 mg via ORAL
  Filled 2011-04-19 (×5): qty 1

## 2011-04-19 SURGICAL SUPPLY — 46 items
APL SKNCLS STERI-STRIP NONHPOA (GAUZE/BANDAGES/DRESSINGS) ×1
BAG SPEC THK2 15X12 ZIP CLS (MISCELLANEOUS) ×1
BAG ZIPLOCK 12X15 (MISCELLANEOUS) ×2 IMPLANT
BENZOIN TINCTURE PRP APPL 2/3 (GAUZE/BANDAGES/DRESSINGS) ×2 IMPLANT
CLEANER TIP ELECTROSURG 2X2 (MISCELLANEOUS) ×2 IMPLANT
CLOTH BEACON ORANGE TIMEOUT ST (SAFETY) ×2 IMPLANT
CONT SPECI 4OZ STER CLIK (MISCELLANEOUS) ×3 IMPLANT
DRAIN PENROSE 18X1/4 LTX STRL (WOUND CARE) IMPLANT
DRAPE LG THREE QUARTER DISP (DRAPES) ×1 IMPLANT
DRAPE MICROSCOPE LEICA (MISCELLANEOUS) ×2 IMPLANT
DRAPE POUCH INSTRU U-SHP 10X18 (DRAPES) ×2 IMPLANT
DRAPE SURG 17X11 SM STRL (DRAPES) ×2 IMPLANT
DRSG ADAPTIC 3X8 NADH LF (GAUZE/BANDAGES/DRESSINGS) ×2 IMPLANT
DRSG PAD ABDOMINAL 8X10 ST (GAUZE/BANDAGES/DRESSINGS) ×2 IMPLANT
DURAPREP 26ML APPLICATOR (WOUND CARE) ×2 IMPLANT
ELECT REM PT RETURN 9FT ADLT (ELECTROSURGICAL) ×2
ELECTRODE REM PT RTRN 9FT ADLT (ELECTROSURGICAL) ×1 IMPLANT
GAUZE SPONGE 4X4 12PLY STRL LF (GAUZE/BANDAGES/DRESSINGS) ×1 IMPLANT
GLOVE BIOGEL PI IND STRL 8.5 (GLOVE) ×1 IMPLANT
GLOVE BIOGEL PI INDICATOR 8.5 (GLOVE) ×1
GLOVE ECLIPSE 8.0 STRL XLNG CF (GLOVE) ×2 IMPLANT
GOWN PREVENTION PLUS LG XLONG (DISPOSABLE) ×4 IMPLANT
GOWN STRL REIN XL XLG (GOWN DISPOSABLE) ×4 IMPLANT
KIT BASIN OR (CUSTOM PROCEDURE TRAY) ×2 IMPLANT
KIT POSITIONING SURG ANDREWS (MISCELLANEOUS) ×2 IMPLANT
MANIFOLD NEPTUNE II (INSTRUMENTS) ×2 IMPLANT
NDL SPNL 18GX3.5 QUINCKE PK (NEEDLE) ×2 IMPLANT
NEEDLE SPNL 18GX3.5 QUINCKE PK (NEEDLE) ×6 IMPLANT
NS IRRIG 1000ML POUR BTL (IV SOLUTION) ×2 IMPLANT
PATTIES SURGICAL .5 X.5 (GAUZE/BANDAGES/DRESSINGS) IMPLANT
PATTIES SURGICAL .75X.75 (GAUZE/BANDAGES/DRESSINGS) IMPLANT
PATTIES SURGICAL 1X1 (DISPOSABLE) IMPLANT
PIN SAFETY NICK PLATE  2 MED (MISCELLANEOUS)
PIN SAFETY NICK PLATE 2 MED (MISCELLANEOUS) IMPLANT
POSITIONER SURGICAL ARM (MISCELLANEOUS) ×2 IMPLANT
SPONGE LAP 4X18 X RAY DECT (DISPOSABLE) ×1 IMPLANT
SPONGE SURGIFOAM ABS GEL 100 (HEMOSTASIS) ×2 IMPLANT
STAPLER VISISTAT 35W (STAPLE) ×1 IMPLANT
SUT VIC AB 0 CT1 27 (SUTURE) ×2
SUT VIC AB 0 CT1 27XBRD ANTBC (SUTURE) ×1 IMPLANT
SUT VIC AB 1 CT1 27 (SUTURE) ×8
SUT VIC AB 1 CT1 27XBRD ANTBC (SUTURE) ×4 IMPLANT
TAPE CLOTH SURG 6X10 WHT LF (GAUZE/BANDAGES/DRESSINGS) ×1 IMPLANT
TOWEL OR 17X26 10 PK STRL BLUE (TOWEL DISPOSABLE) ×4 IMPLANT
TRAY LAMINECTOMY (CUSTOM PROCEDURE TRAY) ×2 IMPLANT
WATER STERILE IRR 1500ML POUR (IV SOLUTION) ×2 IMPLANT

## 2011-04-19 NOTE — Anesthesia Postprocedure Evaluation (Signed)
  Anesthesia Post-op Note  Patient: Peter Marsh  Procedure(s) Performed:  LUMBAR LAMINECTOMY/DECOMPRESSION MICRODISCECTOMY - Hemi LAminectomy/Microdiscectomy Lumbar four  - Lumbar five  on the Left (X-Ray)  Patient Location: PACU  Anesthesia Type: General  Level of Consciousness: awake and alert   Airway and Oxygen Therapy: Patient Spontanous Breathing  Post-op Pain: mild  Post-op Assessment: Post-op Vital signs reviewed, Patient's Cardiovascular Status Stable, Respiratory Function Stable, Patent Airway and No signs of Nausea or vomiting  Post-op Vital Signs: stable  Complications: No apparent anesthesia complications

## 2011-04-19 NOTE — Anesthesia Preprocedure Evaluation (Addendum)
Anesthesia Evaluation  Patient identified by MRN, date of birth, ID band Patient awake    Reviewed: Allergy & Precautions, H&P , NPO status , Patient's Chart, lab work & pertinent test results  Airway Mallampati: II TM Distance: >3 FB Neck ROM: Full    Dental No notable dental hx.    Pulmonary neg pulmonary ROS, asthma ,  CXR OK clear to auscultation  Pulmonary exam normal       Cardiovascular hypertension, Pt. on medications neg cardio ROS Regular Normal    Neuro/Psych PSYCHIATRIC DISORDERS Depression Negative Neurological ROS  Negative Psych ROS   GI/Hepatic negative GI ROS, Neg liver ROS, GERD-  Medicated,  Endo/Other  Negative Endocrine ROS  Renal/GU negative Renal ROS  Genitourinary negative   Musculoskeletal negative musculoskeletal ROS (+)   Abdominal   Peds negative pediatric ROS (+)  Hematology negative hematology ROS (+)   Anesthesia Other Findings   Reproductive/Obstetrics negative OB ROS                          Anesthesia Physical Anesthesia Plan  ASA: II  Anesthesia Plan: General   Post-op Pain Management:    Induction: Intravenous  Airway Management Planned: Oral ETT  Additional Equipment:   Intra-op Plan:   Post-operative Plan: Extubation in OR  Informed Consent: I have reviewed the patients History and Physical, chart, labs and discussed the procedure including the risks, benefits and alternatives for the proposed anesthesia with the patient or authorized representative who has indicated his/her understanding and acceptance.   Dental advisory given  Plan Discussed with: CRNA  Anesthesia Plan Comments: (No asthma symptoms)        Anesthesia Quick Evaluation

## 2011-04-19 NOTE — Transfer of Care (Signed)
Immediate Anesthesia Transfer of Care Note  Patient: Peter Marsh  Procedure(s) Performed:  LUMBAR LAMINECTOMY/DECOMPRESSION MICRODISCECTOMY - Hemi LAminectomy/Microdiscectomy Lumbar four  - Lumbar five  on the Left (X-Ray)  Patient Location: PACU  Anesthesia Type: General  Level of Consciousness: awake, alert , oriented and responds to stimulation  Airway & Oxygen Therapy: Patient Spontanous Breathing and Patient connected to face mask oxygen  Post-op Assessment: Report given to PACU RN and Post -op Vital signs reviewed and stable  Post vital signs: stable  Complications: No apparent anesthesia complications

## 2011-04-19 NOTE — Brief Op Note (Signed)
04/19/2011  10:55 AM  PATIENT:  Peter Marsh  50 y.o. male  PRE-OPERATIVE DIAGNOSIS:  Herniated disc  POST-OPERATIVE DIAGNOSIS:  Herniated Disc Lumbar four lumbar five  PROCEDURE:  Procedure(s): LUMBAR LAMINECTOMY/DECOMPRESSION MICRODISCECTOMY  SURGEON:  Surgeon(s): Jerolyn Flenniken A Eithen Castiglia James P Aplington     ASSISTANTS:Dr.James Aplington   ANESTHESIA:   local and general  EBL:  Total I/O In: 1000 [I.V.:1000] Out: 50 [Blood:50]  BLOOD ADMINISTERED:none  DRAINS: none   LOCAL MEDICATIONS USED:Bubivicaine 20CC diluted with 30cc Normal Saline  SPECIMEN:  Source of Specimen:  L-4--L-5 on the left.  DISPOSITION OF SPECIMEN:  PATHOLOGY  COUNTS:  YES  TOURNIQUET:  * No tourniquets in log *  DICTATION: .Other Dictation: Dictation Number [id number]  PLAN OF CARE: Admit for overnight observation  PATIENT DISPOSITION:  PACU - hemodynamically stable.   Delay start of Pharmacological VTE agent (>24hrs) due to surgical blood loss or risk of bleeding:  {YES/NO/NOT APPLICABLE:20182

## 2011-04-19 NOTE — Progress Notes (Signed)
Report received for lunch relief.

## 2011-04-19 NOTE — Op Note (Signed)
Peter Marsh, Peter Marsh               ACCOUNT NO.:  [id number]  MEDICAL RECORD NO.:  [id number]  LOCATION:  43                         FACILITY:  [facility]  PHYSICIAN:  Kipp Brood. Daymien Goth, M.D.DATE OF BIRTH:  [birth year]  DATE OF PROCEDURE:  04/19/2011 DATE OF DISCHARGE:                              OPERATIVE REPORT   SURGEON:  Kipp Brood. Gladstone Lighter, M.D.  ASSISTANT:  Tarri Glenn, M.D.  PREOPERATIVE DIAGNOSES: 1. Lateral recess stenosis at L4-L5 on the left. 2. Large herniated disk at L4-L5 on the left. 3. Partial footdrop on the left.  POSTOPERATIVE DIAGNOSES: 1. Lateral recess stenosis at L4-L5 on the left. 2. Large herniated disk at L4-L5 on the left. 3. Partial footdrop on the left.  OPERATIONS: 1. Decompression of the lateral recess with lateral recess stenosis at     L4-L5 on the left. 2. Microdiskectomy at L4-L5 on the left.  PROCEDURE:  Under general anesthesia, routine orthopedic prep and draping of the lower back was carried out with the patient on spinal frame.  He had 2 g of IV Ancef.  The appropriate time-out was carried out before any incisions were made or needles were placed in the back. Also I marked the appropriate left side of the back in the holding area. At this time, 2 needles were placed in the back for localization purposes and x-ray was taken.  Following that, incision was made over the L4-L5, L3-L4 area.  The incision was extended distally.  Note, we went through careful detail regards to the levels because of the transitional lamina on one side.  At this time, several x-rays were taken.  We finally located the L4-L5 space.  The muscle was stripped from the lamina bilaterally for purposes of inserting the [facility] retractor.  Following that, I went down and carried out my hemilaminectomy in the usual fashion, both proximally and distally.  We rocked the microscope in and gently removed the ligamentum flavum.  Dr. Shellia Carwin was carried out assistance  in regard to suction and hemostasis and he also participated in the hemilaminectomies.  Once we were down identified the dura, we carefully protected the dura with cottonoids.  I then cauterized the lateral recess veins.  We had to go further out laterally and decompressed the lateral recess, which was extremely tight.  At that particular time, the needle was placed into the disk space we thought was L4-L5.  X-ray, radiologist verified that we were at the proper space.  At this point, we then carried out our cruciate incision in the posterior longitudinal ligament, and we carried out our microdiskectomy.  There were several large fragments of disk removed. We went out laterally as well into the foramen.  We went proximally distally, median laterally and made sure there were no other subligamentous fragments.  The root now was totally free.  We did a nice foraminotomy for the L5 root.  We went up with a hockey-stick proximally and also distally and both foramina were now wide open.  Thoroughly irrigated out the area and loosely applied some thrombin-soaked Gelfoam. At this point, during the procedure, Dr. Shellia Carwin also assisted in the retraction of the dura with the [place] retractor.  At  this particular point, we closed the wound layers in usual fashion except I left a small distal deep and proximal part of the wound open for drainage purposes, and the subcutaneous was closed with 0 Vicryl, skin with metal staples and a sterile Neosporin dressing was applied.  ESTIMATED BLOOD LOSS:  Was about 50 mL.          ______________________________ Kipp Brood. Gladstone Lighter, M.D.     RAG/MEDQ  D:  04/19/2011  T:  04/19/2011  Job:  [id number]

## 2011-04-19 NOTE — Interval H&P Note (Signed)
History and Physical Interval Note:  04/19/2011 8:08 AM  Peter Marsh  has presented today for surgery, with the diagnosis of Herniated disc  The various methods of treatment have been discussed with the patient and family. After consideration of risks, benefits and other options for treatment, the patient has consented to  Procedure(s): LUMBAR LAMINECTOMY/DECOMPRESSION MICRODISCECTOMY as a surgical intervention .  The patients' history has been reviewed, patient examined, no change in status, stable for surgery.  I have reviewed the patients' chart and labs.  Questions were answered to the patient's satisfaction.     Marshelle Bilger A

## 2011-04-19 NOTE — H&P (Signed)
Peter Marsh is an 50 y.o. male.   Chief Complaint: Back pain  HPI: Patient presents with lumbar back pain with radicular pain into the left leg and an associated foot drop on the left. Patient reported the pain after lifting a heavy piano. MRI reveals herniated L4-L5 disc on the left.  Past Medical History  Diagnosis Date  . Hypertension   . Asthma     uses inhalers   . GERD (gastroesophageal reflux disease)   . Chronic kidney disease     bladder interstial cystitis   . Depression     Past Surgical History  Procedure Date  . Appendectomy   . Tonsillectomy     History reviewed. No pertinent family history. Social History:  reports that he quit smoking about 2 years ago. He has never used smokeless tobacco. He reports that he does not drink alcohol or use illicit drugs.  Allergies:  Allergies  Allergen Reactions  . Oxybutynin Chloride (Ditropan Xl) Other (See Comments)    froze the intestine  . Vesicare (Solifenacin Succinate) Other (See Comments)    Froze the intestine     Medications Prior to Admission  Medication Dose Route Frequency Provider Last Rate Last Dose  . ceFAZolin (ANCEF) IVPB 2 g/50 mL premix  2 g Intravenous Once Wayland Baik A Artis Buechele      . lactated ringers infusion   Intravenous Continuous Shayleen Eppinger A Dionicia Cerritos      . povidone-iodine (BETADINE) 7.5 % scrub   Topical Once [facility]      . DISCONTD: ceFAZolin (ANCEF) IVPB 1 g/50 mL premix  1 g Intravenous 60 min Pre-Op Kipp Brood Merilyn Pagan       No current outpatient prescriptions on file as of 04/19/2011.    Results for orders placed during the hospital encounter of 04/19/11 (from the past 48 hour(s))  TYPE AND SCREEN     Status: Normal   Collection Time   04/19/11  6:50 AM      Component Value Range Comment   ABO/RH(D) A NEG      Antibody Screen NEG      Sample Expiration 04/22/2011      Dg Chest 2 View  04/17/2011  *RADIOLOGY REPORT*  Clinical Data: Preoperative respiratory exam.  Lumbar disc  disease.  CHEST - 2 VIEW  Comparison: 03/06/2008  Findings: Heart size and vascularity are normal and the lungs are clear. No significant osseous abnormality.  IMPRESSION: Essentially normal exam.  Original Report Authenticated By: Larey Seat, M.D.   Dg Lumbar Spine 2-3 Views  04/17/2011  *RADIOLOGY REPORT*  Clinical Data: Back pain.  LUMBAR SPINE - 2-3 VIEW  Comparison: None.  Findings: Five lumbar type vertebral bodies are assumed. There appears to be partial sacralization of L5 on the left.  Please correlate with any available cross-sectional imaging prior to surgical intervention.  There is disc space narrowing L4-5 and L5-S1. There is no subluxation.  Mild vascular calcification is present. There is no osseous destructive lesion.  IMPRESSION: Partial sacralization of L5 on the left.  I have no correlative cross sectional imaging.  Please correlate with any such studies prior to surgical intervention.  Numbering scheme used today assumes five lumbar-type vertebral bodies with ribs on the lowest thoracic vertebrae (T12).  Original Report Authenticated By: Staci Righter, M.D.    Review of Systems  Constitutional: Negative for fever.  HENT: Negative for congestion.   Eyes: Negative.   Respiratory: Positive for shortness of breath. Negative for hemoptysis.  Cardiovascular: Negative.   Gastrointestinal: Positive for heartburn.  Genitourinary: Negative.   Musculoskeletal: Positive for back pain.  Skin: Negative for rash.  Neurological: Positive for focal weakness and weakness. Negative for headaches.  Endo/Heme/Allergies: Negative.   Psychiatric/Behavioral: Positive for depression.    Blood pressure 143/102, pulse 86, temperature 98 F (36.7 C), resp. rate 20, SpO2 100.00%. Physical Exam  Constitutional: He appears well-developed.  HENT:  Head: Normocephalic.  Eyes: Pupils are equal, round, and reactive to light.  Neck: Normal range of motion.  Cardiovascular: Normal rate, regular  rhythm and normal heart sounds.   Respiratory: Effort normal. No respiratory distress. He has no wheezes. He has no rales.  GI: Soft. Bowel sounds are normal.  Musculoskeletal: Normal range of motion. He exhibits tenderness.  Neurological: He is alert.       Foot Drop on Left.  Skin: Skin is warm.  Psychiatric: He has a normal mood and affect. Judgment normal.     Assessment/Plan Herniated disc L4-L5 on the left Proceed with laminectomy/microdiscectomy at L4-L5 disc on the left.  Patient has been cleared for surgery by his cardiologist Dr. Einar Gip.  Knox Cervi A 04/19/2011, 7:45 AM

## 2011-04-20 ENCOUNTER — Encounter (HOSPITAL_COMMUNITY): Payer: Self-pay | Admitting: Orthopedic Surgery

## 2011-04-20 DIAGNOSIS — M5126 Other intervertebral disc displacement, lumbar region: Secondary | ICD-10-CM | POA: Diagnosis present

## 2011-04-20 LAB — MRSA CULTURE

## 2011-04-20 MED ORDER — METHOCARBAMOL 500 MG PO TABS: 500.0000 mg | ORAL_TABLET | Freq: Four times a day (QID) | ORAL | Status: AC | PRN

## 2011-04-20 NOTE — Progress Notes (Signed)
Physical Therapy Treatment Patient Details Name: Peter Marsh MRN: [id number] DOB: 1960-09-23 Today's Date: 04/20/2011 14:15-14:30 G  PT Assessment/Plan  PT - Assessment/Plan Comments on Treatment Session: Mobility better than this AM, but still with safety concerns for back precautions and poor exercise tolerance with increased HR Feel patient would benefit from one more session of PT for stair training in AM, OT Consult and D/C to home in AM PT Plan: Discharge plan remains appropriate;Frequency remains appropriate PT Frequency: Min 5X/week Follow Up Recommendations: Home health PT Equipment Recommended: Rolling walker with 5" wheels;3 in 1 bedside comode PT Goals  Acute Rehab PT Goals PT Goal Formulation: With patient Time For Goal Achievement: 7 days Pt will go Supine/Side to Sit: Independently PT Goal: Supine/Side to Sit - Progress: Not met Pt will go Sit to Stand: Independently PT Goal: Sit to Stand - Progress: Progressing toward goal Pt will Ambulate: >150 feet;with modified independence;with least restrictive assistive device PT Goal: Ambulate - Progress: Progressing toward goal Pt will Go Up / Down Stairs: 1-2 stairs;with min assist;with least restrictive assistive device PT Goal: Up/Down Stairs - Progress: Not met Additional Goals Additional Goal #1: pt with state 3/3 back precautions  PT Treatment Precautions/Restrictions  Precautions Precautions: Back Precaution Booklet Issued: Yes (comment) (back handout) Precaution Comments: p consistentlyt needs  reinforcement about mobility with back precautions Required Braces or Orthoses: No Restrictions Weight Bearing Restrictions: No Other Position/Activity Restrictions: pt appears anxious about potential to D/C to home today.   Mobility (including Balance) Bed Mobility Rolling Left: 4: Min assist Supine to Sit: 4: Min assist Sit to Supine - Right: 4: Min assist Sit to Supine - Right Details (indicate cue type and  reason): back precautions reinforced for sit to supin  Transfers Sit to Stand: 5: Supervision Sit to Stand Details (indicate cue type and reason): cues to push up with arms Stand to Sit: 5: Supervision Stand to Sit Details: cues to reach back with arms Ambulation/Gait Ambulation/Gait Assistance: 4: Min assist Ambulation/Gait Assistance Details (indicate cue type and reason):  pt continues to keep back in flextion and pushes down through arms onto walker.  Pt with c/o back pain.  O2 sats 90% on room air and HR 145 with ambulation Ambulation Distance (Feet): 100 Feet Assistive device: Rolling walker Gait Pattern: Step-through pattern;Trunk flexed Gait velocity: slow  Posture/Postural Control Posture/Postural Control: Postural limitations Postural Limitations: flexed Exercise  Other Exercises Other Exercises: bilateral U/E hip to hip for core activation Other Exercises: abdominal and gluteal isometrics End of Session PT - End of Session Equipment Utilized During Treatment: Gait belt Activity Tolerance: Treatment limited secondary to medical complications (Comment) (increased HR with activity) Patient left: in bed Nurse Communication: Mobility status for transfers;Mobility status for ambulation (discused D/C plan with patient) General Behavior During Session: [facility] for tasks performed Cognition: [facility] for tasks performed  Norwood Levo 04/20/2011, 2:47 PM

## 2011-04-20 NOTE — Progress Notes (Signed)
Physical Therapy Evaluation Patient Details Name: Peter Marsh MRN: [id number] DOB: [birth year] Today's Date: 04/20/2011 9:50-10:15 EVII Recommend: HHPT, RW 3in1 at Discharge Please order acute OT consult  Problem List: There is no problem list on file for this patient.   Past Medical History:  Past Medical History  Diagnosis Date  . Hypertension   . Asthma     uses inhalers   . GERD (gastroesophageal reflux disease)   . Chronic kidney disease     bladder interstial cystitis   . Depression    Past Surgical History:  Past Surgical History  Procedure Date  . Appendectomy   . Tonsillectomy     PT Assessment/Plan/Recommendation PT Assessment Clinical Impression Statement: pt with lumbar discectomy who is initiating gait and mobility slowly post op.  He appears anxious and has increased HR to 144 with ambulation.  Expect he will continue to progress and be able to D/C to home with HHPT, RW and 3in1 as he will be alone during the day while wife works. Recommend acute OT consult prior to D/C PT Recommendation/Assessment: Patient will need skilled PT in the acute care venue PT Problem List: Decreased activity tolerance;Decreased mobility;Decreased knowledge of use of DME;Decreased knowledge of precautions;Pain Barriers to Discharge: Decreased caregiver support Barriers to Discharge Comments: wife works during the day PT Therapy Diagnosis : Acute pain;Difficulty walking (Increased HR response to activity) PT Plan PT Frequency: Min 5X/week PT Treatment/Interventions: DME instruction;Gait training;Stair training;Functional mobility training;Patient/family education PT Recommendation Recommendations for Other Services: OT consult Follow Up Recommendations: Home health PT Equipment Recommended: Rolling walker with 5" wheels;3 in 1 bedside comode PT Goals  Acute Rehab PT Goals PT Goal Formulation: With patient Time For Goal Achievement: 7 days Pt will go Supine/Side to Sit:  Independently PT Goal: Supine/Side to Sit - Progress: Not met Pt will go Sit to Stand: Independently PT Goal: Sit to Stand - Progress: Not met Pt will Ambulate: >150 feet;with modified independence;with least restrictive assistive device PT Goal: Ambulate - Progress: Not met Pt will Go Up / Down Stairs: 1-2 stairs;with min assist;with least restrictive assistive device PT Goal: Up/Down Stairs - Progress: Not met Additional Goals Additional Goal #1: pt with state 3/3 back precautions  PT Evaluation Precautions/Restrictions  Precautions Precautions: Back Precaution Booklet Issued: Yes (comment) (back handout) Precaution Comments: verbally reviewed back precautions with education to do core isometrics for stabalization and be conscious of back position in mobility Required Braces or Orthoses: No Restrictions Weight Bearing Restrictions: No Prior Functioning  Home Living Lives With: Significant other Receives Help From: Family Type of Home: Apartment Home Layout: One level Home Access: Stairs to enter Entrance Stairs-Rails: Left Entrance Stairs-Number of Steps: 2 Prior Function Level of Independence: Independent with gait (pt had increased pain in left leg while walking) Cognition Cognition Arousal/Alertness: Awake/alert (pt appears anxious) Overall Cognitive Status: Appears within functional limits for tasks assessed Orientation Level: Oriented X4 Sensation/Coordination Sensation Light Touch: Appears Intact Proprioception: Appears Intact Coordination Gross Motor Movements are Fluid and Coordinated: Yes Extremity Assessment RLE Assessment RLE Assessment: Exceptions to [facility] LLE Assessment LLE Assessment: Within Functional Limits Mobility (including Balance) Bed Mobility Rolling Left: 4: Min assist Rolling Left Details (indicate cue type and reason): instruction for back precautions Supine to Sit: 4: Min assist Supine to Sit Details (indicate cue type and reason): needs  assist to raise upper body off bed Transfers Sit to Stand: 4: Min assist Sit to Stand Details (indicate cue type and reason): verbal cues to push  up with arms Stand to Sit: 4: Min assist Stand to Sit Details: cues to reach back for bed Ambulation/Gait Ambulation/Gait Assistance: 4: Min assist Ambulation/Gait Assistance Details (indicate cue type and reason): pt instructed to use RW for initial post op stability Ambulation Distance (Feet): 50 Feet Assistive device: Rolling walker Gait Pattern: Step-through pattern;Trunk flexed Gait velocity: slow Stairs: No  Posture/Postural Control Posture/Postural Control: Postural limitations Postural Limitations: pt with large post op dressing on back. keeps trunk in slight flextion Balance Balance Assessed: No Exercise  Other Exercises Other Exercises: bilateral U/E hip to hip for core activation Other Exercises: abdominal and gluteal isometrics End of Session PT - End of Session Equipment Utilized During Treatment: Gait belt (RW) Activity Tolerance:  (pt had increased HR with activity) Patient left: in chair Nurse Communication: Mobility status for transfers;Mobility status for ambulation (requested OT consult) General Behavior During Session: [facility] for tasks performed Cognition: [facility] for tasks performed  Norwood Levo 04/20/2011, 11:03 AM

## 2011-04-20 NOTE — Progress Notes (Signed)
Subjective: Pt complains of nausea and slow in urination. Pt is still having back pain.    Objective: Vital signs in last 24 hours: Temp:  [97.5 F (36.4 C)-99.5 F (37.5 C)] 98.8 F (37.1 C) (12/13 0800) Pulse Rate:  [71-145] 145  (12/13 1433) Resp:  [12-18] 12  (12/13 0800) BP: (115-131)/(80-89) 115/80 mmHg (12/13 0800) SpO2:  [90 %-100 %] 90 % (12/13 1433) Weight:  [100.426 kg (221 lb 6.4 oz)] 221 lb 6.4 oz (100.426 kg) (12/12 1520)  Intake/Output from previous day: 12/12 0701 - 12/13 0700 In: 2195 [P.O.:540; I.V.:1600; IV Piggyback:55] Out: 2315 [Urine:2265; Blood:50] Intake/Output this shift: Total I/O In: 2391 [P.O.:360; I.V.:2031] Out: 1000 [Urine:1000]  No pertinent lab values in last 24 hours.  Neurologically intact Neurovascular intact Dorsiflexion/Plantar flexion intact  Assessment/Plan: Plan to discharge in the morning.   Geniya Fulgham A 04/20/2011, 3:12 PM

## 2011-04-20 NOTE — Progress Notes (Signed)
Chart reviewed and UR completed. 

## 2011-04-20 NOTE — Discharge Summary (Signed)
Physician Discharge Summary  Patient ID: Peter Marsh MRN: [id number] DOB/AGE: 12-22-1960 50 y.o.  Admit date: 04/19/2011 Discharge date: 04/20/2011  Admission Diagnoses: Herniated lumbar disc L4-L5, left. Spinal stenosis, L4-L5 left  Discharge Diagnoses: Herniated lumbar disc L4-L5 left. Spinal stenosis L4-L5 left  Discharged Condition: good  Hospital Course: Physical therapy and ambulation. Pt complained of post-op back pain and nausea.    Significant Diagnostic Studies: X-rays in OR.   Treatments: antibiotics: Ancef and surgery: Lumbar laminectomy and microdiscectomy L4-L5 left.   Discharge Exam: Blood pressure 128/80, pulse 112, temperature 99.8 F (37.7 C), temperature source Oral, resp. rate 19, height 6\' 1"  (1.854 m), weight 100.426 kg (221 lb 6.4 oz), SpO2 94.00%. Extremities: Homans sign is negative, no sign of DVT Pulses: 2+ and symmetric Neurologic: Sensory: Pre-op foot drop is improved. Sensory normal Motor: Pre-op foot drop improved  Disposition: Discharge to home   Current Discharge Medication List    CONTINUE these medications which have NOT CHANGED   Details  acetaminophen (TYLENOL) 500 MG tablet Take 500 mg by mouth every 6 (six) hours as needed. Pain     albuterol (PROVENTIL HFA;VENTOLIN HFA) 108 (90 BASE) MCG/ACT inhaler Inhale 2 puffs into the lungs every 6 (six) hours as needed. Wheezing     budesonide-formoterol (SYMBICORT) 160-4.5 MCG/ACT inhaler Inhale 2 puffs into the lungs 2 (two) times daily.     buPROPion (WELLBUTRIN SR) 150 MG 12 hr tablet Take 150 mg by mouth every morning.     Calcium Glycerophosphate (PRELIEF) 340 (65-50) MG (CA-P) TABS Take 1 tablet by mouth daily.     esomeprazole (NEXIUM) 40 MG capsule Take 40 mg by mouth daily before breakfast.     losartan-hydrochlorothiazide (HYZAAR) 100-25 MG per tablet Take 1 tablet by mouth every morning.     niacin (NIASPAN) 500 MG CR tablet Take 500 mg by mouth at bedtime.       risperiDONE microspheres (RISPERDAL CONSTA) 25 MG injection Inject 25 mg into the muscle every 14 (fourteen) days.          Signed: Cyris Maalouf A 04/20/2011, 5:59 PM

## 2011-04-21 MED ORDER — OXYCODONE-ACETAMINOPHEN 10-650 MG PO TABS: 1.0000 | ORAL_TABLET | ORAL | Status: AC | PRN

## 2011-04-21 NOTE — Progress Notes (Signed)
Pt to be discharged home with wife.  Given prescriptions for robaxin and percocet (per Dr. Gladstone Lighter phone order), walker delivered to room. Pt verbalized understanding of home medications and discharge instructions.

## 2011-04-21 NOTE — Progress Notes (Signed)
Physical Therapy Treatment Patient Details Name: Peter Marsh MRN: [id number] DOB: May 30, 1960 Today's Date: 04/21/2011  PT Assessment/Plan Pt improving in mobility: still with Increased HR (146) with activity with sustained increased HR for several minutes after resting.  Pt will need continued PT at home to monitor exercise progression and continue reinforecement of back precautions.  Recommend:  HHPT. 3in1, RW   PT Goals  Acute Rehab PT Goals PT Goal: Supine/Side to Sit - Progress: Progressing toward goal PT Goal: Sit to Stand - Progress: Progressing toward goal PT Goal: Ambulate - Progress: Progressing toward goal PT Goal: Up/Down Stairs - Progress: Progressing toward goal  PT Treatment Precautions/Restrictions  Precautions Precautions: Back Precaution Booklet Issued: Yes (comment) (back handout) Precaution Comments: pt able to state to not raise arms overhead(no extension)  and to keep his shoulders back(no flextion) reminded about no twisting Required Braces or Orthoses: No Restrictions Weight Bearing Restrictions: No Other Position/Activity Restrictions: pt appears anxious about potential to D/C to home today.   Mobility (including Balance) Bed Mobility Rolling Left: 5: Supervision Supine to Sit: 5: Supervision Supine to Sit Details (indicate cue type and reason): supervision for back precautions Transfers Sit to Stand: 5: Supervision Stand to Sit: 5: Supervision Stand to Sit Details: verbally reviewed car transfers with attentiont to back precautions Ambulation/Gait Ambulation/Gait Assistance: 5: Supervision Ambulation/Gait Assistance Details (indicate cue type and reason): extra time, cues for posture and to stand tall, step up into walker Assistive device: Rolling walker Gait Pattern: Step-through pattern Gait velocity: better speed today Stairs: Yes Stairs Assistance: 4: Min assist Stairs Assistance Details (indicate cue type and reason): cues or sequence and  reinforcement of back precautions Stair Management Technique: One rail Right;Sideways Number of Stairs: 2  Height of Stairs: 8   Posture/Postural Control Posture/Postural Control: Postural limitations Postural Limitations: still tends to keep flexed, but better effort with extension Exercise    End of Session PT - End of Session Equipment Utilized During Treatment:  (RW) Activity Tolerance: Patient tolerated treatment well Patient left: in chair Nurse Communication:  (increased HR with activity, need for home equipment and HHPT) General Behavior During Session: [facility] for tasks performed Cognition: [facility] for tasks performed  Norwood Levo 04/21/2011, 10:23 AM

## 2011-04-21 NOTE — Progress Notes (Signed)
04/21/11, Peter Marsh RNC-MNN, BSN, CM received referral for rolling walker.  [facility] fordel[facility] rolling walker to pt.  [phone].

## 2011-04-21 NOTE — Progress Notes (Signed)
Noticed PT recommendation for 3N1 as well as HH PT.  Spoke to bedside RN and she said that she had called Doctor and he did not want 3N1 or PT for pt.

## 2011-04-21 NOTE — Progress Notes (Signed)
Subjective: Doing much better today. Dressing changed and wound looks fine.   Objective: Vital signs in last 24 hours: Temp:  [98.8 F (37.1 C)-99.8 F (37.7 C)] 99.8 F (37.7 C) (12/14 0500) Pulse Rate:  [94-145] 94  (12/14 0500) Resp:  [12-20] 20  (12/14 0500) BP: (105-128)/(73-84) 105/73 mmHg (12/14 0500) SpO2:  [90 %-96 %] 93 % (12/14 0500)  Intake/Output from previous day: 12/13 0701 - 12/14 0700 In: 4141 [P.O.:710; I.V.:3431] Out: 1500 [Urine:1500] Intake/Output this shift: Total I/O In: 1400 [I.V.:1400] Out: -   No results found for this basename: HGB:5 in the last 72 hours No results found for this basename: WBC:2,RBC:2,HCT:2,PLT:2 in the last 72 hours No results found for this basename: NA:2,K:2,CL:2,CO2:2,BUN:2,CREATININE:2,GLUCOSE:2,CALCIUM:2 in the last 72 hours No results found for this basename: LABPT:2,INR:2 in the last 72 hours  Neurologically intact Dorsiflexion/Plantar flexion intact  Assessment/Plan: Will DC today.   Peter Marsh A 04/21/2011, 6:59 AM

## 2013-06-18 ENCOUNTER — Emergency Department (HOSPITAL_COMMUNITY): Payer: Medicare Other

## 2013-06-18 ENCOUNTER — Observation Stay (HOSPITAL_COMMUNITY)
Admission: EM | Admit: 2013-06-18 | Discharge: 2013-06-18 | Disposition: A | Discharge status: Home / Self Care | Payer: Medicare Other | Attending: Emergency Medicine | Admitting: Emergency Medicine

## 2013-06-18 ENCOUNTER — Encounter (HOSPITAL_COMMUNITY): Payer: Self-pay | Admitting: Emergency Medicine

## 2013-06-18 DIAGNOSIS — F3289 Other specified depressive episodes: Secondary | ICD-10-CM | POA: Insufficient documentation

## 2013-06-18 DIAGNOSIS — N189 Chronic kidney disease, unspecified: Secondary | ICD-10-CM | POA: Insufficient documentation

## 2013-06-18 DIAGNOSIS — R1084 Generalized abdominal pain: Secondary | ICD-10-CM | POA: Insufficient documentation

## 2013-06-18 DIAGNOSIS — Z87891 Personal history of nicotine dependence: Secondary | ICD-10-CM | POA: Insufficient documentation

## 2013-06-18 DIAGNOSIS — K654 Sclerosing mesenteritis: Secondary | ICD-10-CM | POA: Insufficient documentation

## 2013-06-18 DIAGNOSIS — K921 Melena: Secondary | ICD-10-CM

## 2013-06-18 DIAGNOSIS — Z79899 Other long term (current) drug therapy: Secondary | ICD-10-CM | POA: Insufficient documentation

## 2013-06-18 DIAGNOSIS — K5900 Constipation, unspecified: Secondary | ICD-10-CM | POA: Insufficient documentation

## 2013-06-18 DIAGNOSIS — K219 Gastro-esophageal reflux disease without esophagitis: Secondary | ICD-10-CM | POA: Insufficient documentation

## 2013-06-18 DIAGNOSIS — K861 Other chronic pancreatitis: Secondary | ICD-10-CM | POA: Insufficient documentation

## 2013-06-18 DIAGNOSIS — J45909 Unspecified asthma, uncomplicated: Secondary | ICD-10-CM | POA: Insufficient documentation

## 2013-06-18 DIAGNOSIS — K625 Hemorrhage of anus and rectum: Principal | ICD-10-CM | POA: Diagnosis present

## 2013-06-18 DIAGNOSIS — F329 Major depressive disorder, single episode, unspecified: Secondary | ICD-10-CM | POA: Insufficient documentation

## 2013-06-18 DIAGNOSIS — N1330 Unspecified hydronephrosis: Secondary | ICD-10-CM | POA: Insufficient documentation

## 2013-06-18 DIAGNOSIS — R000 Tachycardia, unspecified: Secondary | ICD-10-CM | POA: Insufficient documentation

## 2013-06-18 DIAGNOSIS — I129 Hypertensive chronic kidney disease with stage 1 through stage 4 chronic kidney disease, or unspecified chronic kidney disease: Secondary | ICD-10-CM | POA: Insufficient documentation

## 2013-06-18 HISTORY — DX: Nausea with vomiting, unspecified: R11.2

## 2013-06-18 HISTORY — DX: Other specified postprocedural states: Z98.890

## 2013-06-18 LAB — COMPREHENSIVE METABOLIC PANEL
ALBUMIN: 3.6 g/dL (ref 3.5–5.2)
ALT: 16 U/L (ref 0–53)
AST: 16 U/L (ref 0–37)
Alkaline Phosphatase: 76 U/L (ref 39–117)
BILIRUBIN TOTAL: 0.2 mg/dL — AB (ref 0.3–1.2)
BUN: 21 mg/dL (ref 6–23)
CHLORIDE: 98 meq/L (ref 96–112)
CO2: 25 mEq/L (ref 19–32)
CREATININE: 1.77 mg/dL — AB (ref 0.50–1.35)
Calcium: 9.1 mg/dL (ref 8.4–10.5)
GFR, EST AFRICAN AMERICAN: 48 mL/min — AB (ref >90)
GFR, EST NON AFRICAN AMERICAN: 42 mL/min — AB (ref >90)
GLUCOSE: 120 mg/dL — AB (ref 70–99)
Potassium: 4.4 mEq/L (ref 3.7–5.3)
Sodium: 134 mEq/L — ABNORMAL LOW (ref 137–147)
Total Protein: 6.8 g/dL (ref 6.0–8.3)

## 2013-06-18 LAB — CBC
HEMATOCRIT: 35.1 % — AB (ref 39.0–52.0)
HEMOGLOBIN: 11.9 g/dL — AB (ref 13.0–17.0)
MCH: 29.1 pg (ref 26.0–34.0)
MCHC: 33.9 g/dL (ref 30.0–36.0)
MCV: 85.8 fL (ref 78.0–100.0)
Platelets: 241 10*3/uL (ref 150–400)
RBC: 4.09 MIL/uL — ABNORMAL LOW (ref 4.22–5.81)
RDW: 12.7 % (ref 11.5–15.5)
WBC: 8.2 10*3/uL (ref 4.0–10.5)

## 2013-06-18 LAB — OCCULT BLOOD, POC DEVICE: Fecal Occult Bld: POSITIVE — AB

## 2013-06-18 LAB — LIPASE, BLOOD: LIPASE: 23 U/L (ref 11–59)

## 2013-06-18 LAB — CG4 I-STAT (LACTIC ACID): Lactic Acid, Venous: 1 mmol/L (ref 0.5–2.2)

## 2013-06-18 MED ORDER — IOHEXOL 300 MG/ML  SOLN
50.0000 mL | Freq: Once | INTRAMUSCULAR | Status: AC | PRN
  Administered 2013-06-18: 50 mL via ORAL

## 2013-06-18 MED ORDER — HYDROCHLOROTHIAZIDE 25 MG PO TABS
25.0000 mg | ORAL_TABLET | Freq: Every day | ORAL | Status: DC
  Filled 2013-06-18: qty 1

## 2013-06-18 MED ORDER — LOSARTAN POTASSIUM-HCTZ 100-25 MG PO TABS: 1.0000 | ORAL_TABLET | ORAL | Status: DC

## 2013-06-18 MED ORDER — BUPROPION HCL ER (SR) 150 MG PO TB12
150.0000 mg | ORAL_TABLET | Freq: Every day | ORAL | Status: DC
  Filled 2013-06-18: qty 1

## 2013-06-18 MED ORDER — LOSARTAN POTASSIUM 50 MG PO TABS
100.0000 mg | ORAL_TABLET | Freq: Every day | ORAL | Status: DC
  Filled 2013-06-18: qty 2

## 2013-06-18 MED ORDER — SODIUM CHLORIDE 0.9 % IV SOLN
INTRAVENOUS | Status: DC
  Administered 2013-06-18: 100 mL/h via INTRAVENOUS

## 2013-06-18 MED ORDER — BUDESONIDE-FORMOTEROL FUMARATE 160-4.5 MCG/ACT IN AERO
2.0000 | INHALATION_SPRAY | Freq: Two times a day (BID) | RESPIRATORY_TRACT | Status: DC
  Filled 2013-06-18: qty 6

## 2013-06-18 MED ORDER — RISPERIDONE MICROSPHERES 25 MG IM SUSR: 25.0000 mg | INTRAMUSCULAR | Status: DC

## 2013-06-18 MED ORDER — ALBUTEROL SULFATE HFA 108 (90 BASE) MCG/ACT IN AERS: 2.0000 | INHALATION_SPRAY | Freq: Four times a day (QID) | RESPIRATORY_TRACT | Status: DC | PRN

## 2013-06-18 MED ORDER — ADULT MULTIVITAMIN W/MINERALS CH: 1.0000 | ORAL_TABLET | Freq: Every day | ORAL | Status: DC

## 2013-06-18 MED ORDER — VITAMIN D3 25 MCG (1000 UNIT) PO TABS
1000.0000 [IU] | ORAL_TABLET | Freq: Every day | ORAL | Status: DC
  Filled 2013-06-18: qty 1

## 2013-06-18 MED ORDER — PANTOPRAZOLE SODIUM 40 MG PO TBEC
40.0000 mg | DELAYED_RELEASE_TABLET | Freq: Every day | ORAL | Status: DC
  Administered 2013-06-18: 40 mg via ORAL
  Filled 2013-06-18: qty 1

## 2013-06-18 NOTE — ED Provider Notes (Signed)
CSN: [id number]     Arrival date & time 06/18/13  0847 History   First MD Initiated Contact with Patient 06/18/13 [phone]     Chief Complaint  Patient presents with  . Rectal Bleeding  . Constipation     (Consider location/radiation/quality/duration/timing/severity/associated sxs/prior Treatment) Patient is a 53 y.o. male presenting with hematochezia and constipation. The history is provided by the patient.  Rectal Bleeding Quality:  Bright red Amount:  Moderate Timing: once. Progression:  Unchanged Chronicity:  New Context: defecation   Context: not anal fissures, not anal penetration, not diarrhea and not hemorrhoids   Similar prior episodes: no   Relieved by:  Nothing Worsened by:  Nothing tried Ineffective treatments:  None tried Associated symptoms: abdominal pain and light-headedness (mild)   Associated symptoms: no dizziness, no epistaxis, no fever, no loss of consciousness and no vomiting   Constipation Associated symptoms: abdominal pain, hematochezia and nausea   Associated symptoms: no diarrhea, no fever and no vomiting     Past Medical History  Diagnosis Date  . Hypertension   . Asthma     uses inhalers   . GERD (gastroesophageal reflux disease)   . Chronic kidney disease     bladder interstial cystitis   . Depression    Past Surgical History  Procedure Laterality Date  . Appendectomy    . Tonsillectomy    . Lumbar laminectomy/decompression microdiscectomy  04/19/2011    Procedure: LUMBAR LAMINECTOMY/DECOMPRESSION MICRODISCECTOMY;  Surgeon: Tobi Bastos;  Location: [facility];  Service: Orthopedics;  Laterality: Left;  Hemi LAminectomy/Microdiscectomy Lumbar four  - Lumbar five  on the Left (X-Ray)   History reviewed. No pertinent family history. History  Substance Use Topics  . Smoking status: Former Smoker -- 30 years    Quit date: 05/08/2008  . Smokeless tobacco: Never Used  . Alcohol Use: No    Review of Systems  Constitutional: Negative for  fever.  HENT: Negative for nosebleeds.   Gastrointestinal: Positive for nausea, abdominal pain, constipation, hematochezia and anal bleeding. Negative for vomiting and diarrhea.  Neurological: Positive for light-headedness (mild). Negative for dizziness and loss of consciousness.  All other systems reviewed and are negative.      Allergies  Oxybutynin chloride and Vesicare  Home Medications   Current Outpatient Rx  Name  Route  Sig  Dispense  Refill  . albuterol (PROVENTIL HFA;VENTOLIN HFA) 108 (90 BASE) MCG/ACT inhaler   Inhalation   Inhale 2 puffs into the lungs every 6 (six) hours as needed. Wheezing          . budesonide-formoterol (SYMBICORT) 160-4.5 MCG/ACT inhaler   Inhalation   Inhale 2 puffs into the lungs 2 (two) times daily.          [name] buPROPion (WELLBUTRIN SR) 150 MG 12 hr tablet   Oral   Take 150 mg by mouth every morning.          . cholecalciferol (VITAMIN D) 1000 UNITS tablet   Oral   Take 1,000 Units by mouth daily.         [name] Dexlansoprazole (DEXILANT) 30 MG capsule   Oral   Take 30 mg by mouth daily.         [name] losartan-hydrochlorothiazide (HYZAAR) 100-25 MG per tablet   Oral   Take 1 tablet by mouth every morning.          . Multiple Vitamin (MULTIVITAMIN WITH MINERALS) TABS tablet   Oral   Take 1 tablet by mouth daily.         [name]  risperiDONE microspheres (RISPERDAL CONSTA) 25 MG injection   Intramuscular   Inject 25 mg into the muscle every 14 (fourteen) days.          . vitamin B-12 (CYANOCOBALAMIN) 100 MCG tablet   Oral   Take 100 mcg by mouth daily.          BP 141/90  Pulse 110  Temp(Src) 98 F (36.7 C) (Oral)  Resp 18  SpO2 97% Physical Exam  Nursing note and vitals reviewed. Constitutional: He is oriented to person, place, and time. He appears well-developed and well-nourished. No distress.  HENT:  Head: Normocephalic and atraumatic.  Mouth/Throat: No oropharyngeal exudate.  Eyes: EOM are normal. Pupils are  equal, round, and reactive to light.  Neck: Normal range of motion. Neck supple.  Cardiovascular: Normal rate and regular rhythm.  Exam reveals no friction rub.   No murmur heard. Pulmonary/Chest: Effort normal and breath sounds normal. No respiratory distress. He has no wheezes. He has no rales.  Abdominal: Soft. He exhibits no distension. There is tenderness (mild, tenderness). There is no rebound.  Genitourinary: Rectal exam shows no internal hemorrhoid, no mass, no tenderness and anal tone normal. Guaiac positive stool (brown stool, no gross blood).  Musculoskeletal: Normal range of motion. He exhibits no edema.  Neurological: He is alert and oriented to person, place, and time.  Skin: No rash noted. He is not diaphoretic.    ED Course  Procedures (including critical care time) Labs Review Labs Reviewed  CBC  COMPREHENSIVE METABOLIC PANEL  LIPASE, BLOOD   Imaging Review No results found.  EKG Interpretation    Date/Time:  Wednesday June 18 2013 09:11:14 EST Ventricular Rate:  100 PR Interval:  139 QRS Duration: 156 QT Interval:  371 QTC Calculation: 478 R Axis:   61 Text Interpretation:  Sinus tachycardia Left bundle branch block Similar to prior Confirmed by Mingo Amber  MD, [place] ([id number]) on 06/18/2013 9:16:58 AM            MDM   Final diagnoses:  Blood in stool    53M presents with rectal bleeding. Had one episode earlier this morning of BRBPR on the paper. No pain at that time. Patient had a painless bowel movement full of blood without pain. No hx of GI bleeding. Has a GI doctor, Dr. Benson Norway.  Here afebrile, mild tachycardia. Patient scanned with his mild diffuse abdominal pain, showed mild enteritis. Patient hemoccult positive with brown stool. Will consult for admission, patient's GI doctor paged. I saw patient after admission completed, he was still in the ED. I was unaware admission had been completed. He states resolution of his pain, no more bloody bowel  movements, he just had one with brown stool. I asked patient if he would like to go home, patient stated he would. He can f/u with his PCP and GI soon. I spoke to his GI doctor about this, they will f/u with him. I informed hospitalist, who asked patient be made AMA. I asked nurse to perform AMA paperwork and I discussed these options with the patient. He is aware of the risk. I do not think AMA paperwork was filled out. Patient looking well, stable vitals upon discharge.    Osvaldo Shipper, MD 06/19/13 [phone]

## 2013-06-18 NOTE — Progress Notes (Signed)
   CARE MANAGEMENT ED NOTE 06/18/2013  Patient:  Peter Marsh, Peter Marsh   Account Number:  [id number]  Date Initiated:  06/18/2013  Documentation initiated by:  Jackelyn Poling  Subjective/Objective Assessment:   53 yr old male medicaid of [place] pt states he has medicare coverage also and stephanie powers is pcp Pt requested d/c home     Subjective/Objective Assessment Detail:     Action/Plan:   Epic updated   Action/Plan Detail:   Anticipated DC Date:  06/18/2013     Status Recommendation to Physician:   Result of Recommendation:    Other ED [place]  Other  PCP issues  Outpatient Services - Pt will follow up    Choice offered to / List presented to:            Status of service:  Completed, signed off  ED Comments:   ED Comments Detail:

## 2013-06-18 NOTE — ED Notes (Addendum)
Pt c/o rectal bleeding and constipation x 1 day.  Pt reports that blood was bright red.  Pt sts "I sat down to use the restroom and blood poured out."  Pt c/o dizziness to NT, but did not report that to me.

## 2013-06-18 NOTE — Discharge Instructions (Signed)
Bloody Stools Bloody stools often mean that there is a problem in the digestive tract. Your caregiver may use the term "melena" to describe black, tarry, and bad smelling stools or "hematochezia" to describe red or maroon-colored stools. Blood seen in the stool can be caused by bleeding anywhere along the intestinal tract.  A black stool usually means that blood is coming from the upper part of the gastrointestinal tract (esophagus, stomach, or small bowel). Passing maroon-colored stools or bright red blood usually means that blood is coming from lower down in the large bowel or the rectum. However, sometimes massive bleeding in the stomach or small intestine can cause bright red bloody stools.  Consuming black licorice, lead, iron pills, medicines containing bismuth subsalicylate, or blueberries can also cause black stools. Your caregiver can test black stools to see if blood is present. It is important that the cause of the bleeding be found. Treatment can then be started, and the problem can be corrected. Rectal bleeding may not be serious, but you should not assume everything is okay until you know the cause.It is very important to follow up with your caregiver or a specialist in gastrointestinal problems. CAUSES  Blood in the stools can come from various underlying causes.Often, the cause is not found during your first visit. Testing is often needed to discover the cause of bleeding in the gastrointestinal tract. Causes range from simple to serious or even life-threatening.Possible causes include:  Hemorrhoids.These are veins that are full of blood (engorged) in the rectum. They cause pain, inflammation, and may bleed.  Anal fissures.These are areas of painful tearing which may bleed. They are often caused by passing hard stool.  Diverticulosis.These are pouches that form on the colon over time, with age, and may bleed significantly.  Diverticulitis.This is inflammation in areas with  diverticulosis. It can cause pain, fever, and bloody stools, although bleeding is rare.  Proctitis and colitis. These are inflamed areas of the rectum or colon. They may cause pain, fever, and bloody stools.  Polyps and cancer. Colon cancer is a leading cause of preventable cancer death.It often starts out as precancerous polyps that can be removed during a colonoscopy, preventing progression into cancer. Sometimes, polyps and cancer may cause rectal bleeding.  Gastritis and ulcers.Bleeding from the upper gastrointestinal tract (near the stomach) may travel through the intestines and produce black, sometimes tarry, often bad smelling stools. In certain cases, if the bleeding is fast enough, the stools may not be black, but red and the condition may be life-threatening. SYMPTOMS  You may have stools that are bright red and bloody, that are normal color with blood on them, or that are dark black and tarry. In some cases, you may only have blood in the toilet bowl. Any of these cases need medical care. You may also have:  Pain at the anus or anywhere in the rectum.  Lightheadedness or feeling faint.  Extreme weakness.  Nausea or vomiting.  Fever. DIAGNOSIS Your caregiver may use the following methods to find the cause of your bleeding:  Taking a medical history. Age is important. Older people tend to develop polyps and cancer more often. If there is anal pain and a hard, large stool associated with bleeding, a tear of the anus may be the cause. If blood drips into the toilet after a bowel movement, bleeding hemorrhoids may be the problem. The color and frequency of the bleeding are additional considerations. In most cases, the medical history provides clues, but seldom the final  answer.  A visual and finger (digital) exam. Your caregiver will inspect the anal area, looking for tears and hemorrhoids. A finger exam can provide information when there is tenderness or a growth inside. In men, the  prostate is also examined.  Endoscopy. Several types of small, long scopes (endoscopes) are used to view the colon.  In the office, your caregiver may use a rigid, or more commonly, a flexible viewing sigmoidoscope. This exam is called flexible sigmoidoscopy. It is performed in 5 to 10 minutes.  A more thorough exam is accomplished with a colonoscope. It allows your caregiver to view the entire 5 to 6 foot long colon. Medicine to help you relax (sedative) is usually given for this exam. Frequently, a bleeding lesion may be present beyond the reach of the sigmoidoscope. So, a colonoscopy may be the best exam to start with. Both exams are usually done on an outpatient basis. This means the patient does not stay overnight in the hospital or surgery center.  An upper endoscopy may be needed to examine your stomach. Sedation is used and a flexible endoscope is put in your mouth, down to your stomach.  A barium enema X-ray. This is an X-ray exam. It uses liquid barium inserted by enema into the rectum. This test alone may not identify an actual bleeding point. X-rays highlight abnormal shadows, such as those made by lumps (tumors), diverticuli, or colitis. TREATMENT  Treatment depends on the cause of your bleeding.   For bleeding from the stomach or colon, the caregiver doing your endoscopy or colonoscopy may be able to stop the bleeding as part of the procedure.  Inflammation or infection of the colon can be treated with medicines.  Many rectal problems can be treated with creams, suppositories, or warm baths.  Surgery is sometimes needed.  Blood transfusions are sometimes needed if you have lost a lot of blood.  For any bleeding problem, let your caregiver know if you take aspirin or other blood thinners regularly. HOME CARE INSTRUCTIONS   Take any medicines exactly as prescribed.  Keep your stools soft by eating a diet high in fiber. Prunes (1 to 3 a day) work well for many people.  Drink  enough water and fluids to keep your urine clear or pale yellow.  Take sitz baths if advised. A sitz bath is when you sit in a bathtub with warm water for 10 to 15 minutes to soak, soothe, and cleanse the rectal area.  If enemas or suppositories are advised, be sure you know how to use them. Tell your caregiver if you have problems with this.  Monitor your bowel movements to look for signs of improvement or worsening. SEEK MEDICAL CARE IF:   You do not improve in the time expected.  Your condition worsens after initial improvement.  You develop any new symptoms. SEEK IMMEDIATE MEDICAL CARE IF:   You develop severe or prolonged rectal bleeding.  You vomit blood.  You feel weak or faint.  You have a fever. MAKE SURE YOU:  Understand these instructions.  Will watch your condition.  Will get help right away if you are not doing well or get worse. Document Released: 04/14/2002 Document Revised: 07/17/2011 Document Reviewed: 09/09/2010 [facility] Patient Information 2014 [place], [place].   Emergency Department Resource Guide 1) Find a Doctor and Pay Out of Pocket Although you won't have to find out who is covered by your insurance plan, it is a good idea to ask around and get recommendations. You will  then need to call the office and see if the doctor you have chosen will accept you as a new patient and what types of options they offer for patients who are self-pay. Some doctors offer discounts or will set up payment plans for their patients who do not have insurance, but you will need to ask so you aren't surprised when you get to your appointment.  2) Contact Your Local Health Department Not all health departments have doctors that can see patients for sick visits, but many do, so it is worth a call to see if yours does. If you don't know where your local health department is, you can check in your phone book. The CDC also has a tool to help you locate your state's health department,  and many state websites also have listings of all of their local health departments.  3) Find a [facility] If your illness is not likely to be very severe or complicated, you may want to try a walk in clinic. These are popping up all over the country in pharmacies, drugstores, and shopping centers. They're usually staffed by nurse practitioners or physician assistants that have been trained to treat common illnesses and complaints. They're usually fairly quick and inexpensive. However, if you have serious medical issues or chronic medical problems, these are probably not your best option.  No Primary Care Doctor: - Call Health Connect at  [phone] - they can help you locate a primary care doctor that  accepts your insurance, provides certain services, etc. - Physician Referral Service- [phone]  Chronic Pain Problems: Organization         Address  Phone   Notes  [facility]  [phone] Patients need to be referred by their primary care doctor.   Medication Assistance: Organization         Address  Phone   Notes  [facility] Medication [facility] [address] [place], [place] [address] [phone] --Must be a resident of [facility] -- Must have NO insurance coverage whatsoever (no Medicaid/ Medicare, etc.) -- The pt. MUST have a primary care doctor that directs their care regularly and follows them in the community   MedAssist  [phone]   [employer]  [phone]    Agencies that provide inexpensive medical care: Organization         Address  Phone   Notes  [place]  [phone]   [facility]    [phone]   [facility] [place], [place] [address] [phone]   [place] [address], [place] [phone]   Planned Parenthood    [phone]   [facility]    [phone]   [place] [address] [place], [place] Phone:  [phone], Fax:  [phone] Hours of Operation:  9 am - 6 pm, M-F.  Also accepts Medicaid/Medicare and self-pay.  [facility] for [place], [address], [place] Phone: [phone], Fax: [phone]. Hours of Operation:  8:30 am - 5:30 pm, M-F.  Also accepts Medicaid and self-pay.  [facility] [address], [place] Phone: [phone]   [place], [place], [place] [phone], Ext. [phone] Mondays & Thursdays: 7-9 AM.  First 15 patients are seen on a first come, first serve  basis.    [place] Providers:  Organization         Address  Phone   Notes  [facility] [address], [place] [phone] Also accepts self-pay patients.  [facility] [id number] [place], [place]  [phone]   [place], [address], [place] [phone]   [facility] [address], [place] [phone]   Lucianne Lei [address], [place]   [phone] Only accepts [place] Access [place] patients after they have their name applied to their card.   Self-Pay (no insurance) in [facility]:  Organization         Address  Phone   Notes  Sickle Cell Patients, [facility] Agency [phone]   [facility] [place] [phone]   [facility] [place]  [place], [place], [place] [phone]   [facility]/Dr. Osei-Bonsu  [address], [place] or [address], [place] [phone] Phone number for both [place] and [place] locations is the same.  [facility]gent [facility] and [facility] [address] [place] [phone]   [facility] [address] [place] or [address] [phone]   [facility] [address], [place] [phone], phone; [phone], fax Sees patients 1st and 3rd Saturday of every month.  Must not qualify for public or private insurance (i.e. Medicaid, Medicare, [place] Health Choice, Veterans' Benefits)  Household income should be no more than 200% of the poverty level The clinic cannot treat you if you are pregnant or think you are pregnant  Sexually transmitted diseases are not treated at the clinic.    Dental Care: Organization         Address  Phone  Notes  [facility] Department of [facility] [phone] Accepts children up to age 38 who are enrolled in [place] or [place]; pregnant women with a Medicaid card; and children who have applied for Medicaid or [place] Health Choice, but were declined, whose parents can pay a reduced fee at time of service.  [facility] Department of [facility]  [address], [place] [phone] Accepts children up to age 10 who are enrolled in [place] or [place]; pregnant women with a Medicaid card; and children who have applied for Medicaid or [place] Health Choice, but were declined, whose parents can pay a reduced fee at time of service.  [facility] Adult Dental Access PROGRAM  [place] [phone] Patients are seen by appointment only. Walk-ins are not accepted. [place] will see patients 43 years of age and older. Monday - Tuesday (8am-5pm) Most Wednesdays (8:30-5pm) $30 per visit, cash only  [facility] Adult Dental Access PROGRAM  [address], [facility] [phone] Patients are seen by appointment only. Walk-ins are not accepted. [place] will see patients 52 years of age and older. One Wednesday Evening (Monthly: Volunteer Based).  $30 per visit,  cash only  [place]  [phone] for adults; Children under age 65, call Graduate Pediatric Dentistry at [phone]. Children aged 24-14, please call [phone] to request a pediatric application.  Dental services are provided in all areas of dental care including fillings, crowns and bridges, complete and partial dentures, implants, gum treatment, root canals, and extractions. Preventive care is also provided. Treatment is provided to both adults and children. Patients are selected via a lottery and there is often a waiting list.   [facility] [address] [place]  [phone] [email or web address]   [facility] [address] [place], [place] [phone], Ext. [phone] Second and Fourth Thursday of each month, opens at 6:30 AM; Clinic ends at 9 AM.  Patients are seen on a first-come first-served basis, and a limited number are seen during each clinic.   [facility]  [address] Hillard Danker [place], [place] [phone]   Eligibility Requirements You must have lived in [place], [place], or [place] counties for at least the last three months.   You cannot be eligible for state or federal sponsored [employer], including [employer], [place], or [employer].   You generally cannot be eligible for healthcare insurance through your employer.    How to apply: Eligibility screenings are held every Tuesday and Wednesday afternoon from 1:00 pm until 4:00 pm. You do not need an appointment for the interview!  [facility] [address] [place], [place]   [place]  [place] Department  [place]  [phone]    Behavioral Health Resources in the Community: Intensive Outpatient Programs Organization         Address  Phone  Notes  [place]. [address]  [place], [place] [phone]   [facility] Outpatient [address], [place], [place]   ADS: Alcohol & Drug Svcs [address] [place], [place]   [place] [address]  [place], [place] or [phone]   Substance Abuse Resources Organization         Address  Phone  Notes  Alcohol and Drug Services  [phone]   [place]  [phone]   [facility] [place]   Chinita Pester  [phone]   Residential & Outpatient Substance Abuse Program  [phone]   Psychological Services Organization         Address  Phone  Notes  [facility] [place]  [place]  [phone]   [place] [address], [place] or [phone]    Mobile Crisis Teams Organization         Address  Phone  Notes  [facility], Mobile Crisis Care Unit  [phone]   Assertive Psychotherapeutic Services  [address] [place], [place]   [place] [address] [place] [phone]    Self-Help/Support Groups Organization         Address  Phone             Notes  [place]. of [place] - variety of support groups  [place] Call for more information  Narcotics Anonymous (NA), [facility] [address], [employer] [place]  2 meetings at this location   Special educational needs teacher         Address  Phone  Notes  [facility]AP Residential Treatment [place],    [place]  [phone]   [place]  [address] [place] [id number], [place], [place]   [place],  [phone] Admissions: 8am-3pm M-F  Incentives Substance [place] [address],    [place], [place] [phone]   [facility] [address] [place], [place], [place]   [facility] [address],  [place], [place]   [facility]ight Programs - Intensive Outpatient [address], [place], [place]   [facility] ([place].,  [place], [place] [phone] or [phone]   [facility]idential Treatment Services (RTS) [address] [place], [place] Accepts Medicaid  [facility] [place] [address]  [place] [phone] Substance Abuse/Addiction Treatment   [facility] Organization         Address  Phone  Notes  [facility]  [phone]   Domenic Schwab, PhD [address] Arlis [place], [place]   [phone] or [phone]   [place], [place] [phone]   [facility] [address] [place], [place] [phone] Insurance/Medicaid/sponsorship through [facility] and Families [address], [address]                                    [place], [place] [phone] [place] [address], [place] [phone]    Dr. Adele Schilder  [phone]   [facility] Dept. 1) [address], [place] 2) [place] 3)  [place] [address], [place] [phone]  [phone]   [place] [phone] or [phone] (After Hours)

## 2013-06-18 NOTE — H&P (Addendum)
Hospitalist Admission History and Physical  Patient name: Peter Marsh Medical record number: [id number] Date of birth: 1961/04/04 Age: 53 y.o. Gender: male  Primary Care Provider: No primary provider on file.  Chief Complaint: BRBPR   History of Present Illness:This is a 53 y.o. year old male with prior hx/o GERD, HTN, CKD stage 2-3, external hemorrhoids presenting with BRBPR x2. Pt states that he noticed a large amount of blood during a bowel movement last night. The patient went to bed. Woke up this morning had another bowel movement that had a large amount of blood in the toilet. Will was bright red burn both instances. Patient denies any abdominal pain, fever, nausea prior to onset. Denies any diarrhea. Denies any NSAID or aspirin use. Does have external hemorrhoids. Denies any anal pruritus or irritation. Patient is followed by Dr. Benson Norway. Had colonoscopy about one year ago that showed benign polyps, otherwise normal per patient and wife. Pt and wife state that this has never happened before. No unintentional weight loss.   In the ER, patient noted to be hemodynamically stable. Hemoglobin at 11.9. MCV 85.8. White blood cell count 8.2. Noted creatinine 1.77. BUN within normal limits at 21. Hemoccult positive. CT of the abdomen with chronic calcific pancreatitis, right hydronephrosis with questionable UPJ obstruction, and a right inferior pole renal cysts, and questionable fibrosing mesenteritis.   Patient Active Problem List   Diagnosis Date Noted  . BRBPR (bright red blood per rectum) 06/18/2013  . Herniated lumbar intervertebral disc 04/20/2011   Past Medical History: Past Medical History  Diagnosis Date  . Hypertension   . Asthma     uses inhalers   . GERD (gastroesophageal reflux disease)   . Chronic kidney disease     bladder interstial cystitis   . Depression     Past Surgical History: Past Surgical History  Procedure Laterality Date  . Appendectomy    . Tonsillectomy     . Lumbar laminectomy/decompression microdiscectomy  04/19/2011    Procedure: LUMBAR LAMINECTOMY/DECOMPRESSION MICRODISCECTOMY;  Surgeon: Tobi Bastos;  Location: [facility];  Service: Orthopedics;  Laterality: Left;  Hemi LAminectomy/Microdiscectomy Lumbar four  - Lumbar five  on the Left (X-Ray)    Social History: History   Social History  . Marital Status: Married    Spouse Name: N/A    Number of Children: N/A  . Years of Education: N/A   Social History Main Topics  . Smoking status: Former Smoker -- 30 years    Quit date: 05/08/2008  . Smokeless tobacco: Never Used  . Alcohol Use: No  . Drug Use: No  . Sexual Activity:    Other Topics Concern  . None   Social History Narrative  . None    Family History: History reviewed. No pertinent family history.  Allergies: Allergies  Allergen Reactions  . Oxybutynin Chloride [Oxybutynin Chloride Er] Other (See Comments)    froze the intestine  . Vesicare [Solifenacin Succinate] Other (See Comments)    Froze the intestine     Current Facility-Administered Medications  Medication Dose Route Frequency Provider Last Rate Last Dose  . 0.9 %  sodium chloride infusion   Intravenous Continuous Shanda Howells, MD      . albuterol (PROVENTIL HFA;VENTOLIN HFA) 108 (90 BASE) MCG/ACT inhaler 2 puff  2 puff Inhalation Q6H PRN Shanda Howells, MD      . budesonide-formoterol [facility]) 160-4.5 MCG/ACT inhaler 2 puff  2 puff Inhalation BID Shanda Howells, MD      . Derrill Memo  ON 06/19/2013] buPROPion (WELLBUTRIN SR) 12 hr tablet 150 mg  150 mg Oral Aaron Mose, MD      . cholecalciferol (VITAMIN D) tablet 1,000 Units  1,000 Units Oral Daily Shanda Howells, MD      . Derrill Memo ON 06/19/2013] losartan-hydrochlorothiazide (HYZAAR) 100-25 MG per tablet 1 tablet  1 tablet Oral [id number] Shanda Howells, MD      . multivitamin with minerals tablet 1 tablet  1 tablet Oral Daily Shanda Howells, MD      . pantoprazole (PROTONIX) EC tablet 40 mg  40 mg Oral  Daily Shanda Howells, MD      . risperiDONE microspheres (RISPERDAL CONSTA) injection 25 mg  25 mg Intramuscular Q14 Days Shanda Howells, MD       Current Outpatient Prescriptions  Medication Sig Dispense Refill  . albuterol (PROVENTIL HFA;VENTOLIN HFA) 108 (90 BASE) MCG/ACT inhaler Inhale 2 puffs into the lungs every 6 (six) hours as needed. Wheezing       . budesonide-formoterol (SYMBICORT) 160-4.5 MCG/ACT inhaler Inhale 2 puffs into the lungs 2 (two) times daily.       [name] buPROPion (WELLBUTRIN SR) 150 MG 12 hr tablet Take 150 mg by mouth every morning.       . cholecalciferol (VITAMIN D) 1000 UNITS tablet Take 1,000 Units by mouth daily.      [name] Dexlansoprazole (DEXILANT) 30 MG capsule Take 30 mg by mouth daily.      [name] losartan-hydrochlorothiazide (HYZAAR) 100-25 MG per tablet Take 1 tablet by mouth every morning.       . Multiple Vitamin (MULTIVITAMIN WITH MINERALS) TABS tablet Take 1 tablet by mouth daily.      . risperiDONE microspheres (RISPERDAL CONSTA) 25 MG injection Inject 25 mg into the muscle every 14 (fourteen) days.       . vitamin B-12 (CYANOCOBALAMIN) 100 MCG tablet Take 100 mcg by mouth daily.       Facility-Administered Medications Ordered in Other Encounters  Medication Dose Route Frequency Provider Last Rate Last Dose  . acetaminophen (OFIRMEV) IVPB    PRN Lissa Morales, CRNA   1,000 mg at 04/19/11 0910  . ePHEDrine injection    PRN Lissa Morales, CRNA   5 mg at 04/19/11 1018  . glycopyrrolate (ROBINUL) injection    PRN Lissa Morales, CRNA   0.8 mg at 04/19/11 1037  . labetalol (NORMODYNE,TRANDATE) injection    PRN Lissa Morales, CRNA   5 mg at 04/19/11 1050  . lidocaine (cardiac) 100 mg/55ml (XYLOCAINE) 20 MG/ML injection 2%    PRN Lissa Morales, CRNA   100 mg at 04/19/11 0840  . midazolam (VERSED) 5 MG/5ML injection    PRN Lissa Morales, CRNA   2 mg at 04/19/11 [id number]  . neostigmine (PROSTIGMINE) injection   Intravenous PRN Lissa Morales, CRNA   5 mg at 04/19/11 1037  .  ondansetron (ZOFRAN) injection    PRN Lissa Morales, CRNA   4 mg at 04/19/11 1029  . propofol (DIPRIVAN) 10 MG/ML infusion    PRN Lissa Morales, CRNA   200 mg at 04/19/11 [id number]  . rocuronium (ZEMURON) injection    PRN Lissa Morales, CRNA   10 mg at 04/19/11 0948  . succinylcholine (ANECTINE) injection    PRN Lissa Morales, CRNA   100 mg at 04/19/11 0844  . SUFentanil (SUFENTA) injection    PRN Lissa Morales, CRNA   10 mcg at 04/19/11 1008   Review  Of Systems: 12 point ROS negative except as noted above in HPI.  Physical Exam: Filed Vitals:   06/18/13 1141  BP: 142/82  Pulse: 83  Temp: 98.3 F (36.8 C)  Resp: 20    General: alert and cooperative HEENT: PERRLA and extra ocular movement intact Heart: S1, S2 normal, no murmur, rub or gallop, regular rate and rhythm Lungs: clear to auscultation, no wheezes or rales and unlabored breathing Abdomen: abdomen is soft without significant tenderness, masses, organomegaly or guarding, noted external hemorrhoids noted on rectal exam.  Extremities: extremities normal, atraumatic, no cyanosis or edema Skin:no rashes, no ecchymoses Neurology: normal without focal findings  Labs and Imaging: Lab Results  Component Value Date/Time   NA 134* 06/18/2013  9:56 AM   K 4.4 06/18/2013  9:56 AM   CL 98 06/18/2013  9:56 AM   CO2 25 06/18/2013  9:56 AM   BUN 21 06/18/2013  9:56 AM   CREATININE 1.77* 06/18/2013  9:56 AM   GLUCOSE 120* 06/18/2013  9:56 AM   Lab Results  Component Value Date   WBC 8.2 06/18/2013   HGB 11.9* 06/18/2013   HCT 35.1* 06/18/2013   MCV 85.8 06/18/2013   PLT 241 06/18/2013   Ct Abdomen Pelvis Wo Contrast  06/18/2013   CLINICAL DATA:  Rectal bleeding, due these abdominal pain, nausea, constipation, history hypertension, asthma, chronic kidney disease  EXAM: CT ABDOMEN AND PELVIS WITHOUT CONTRAST  TECHNIQUE: Multidetector CT imaging of the abdomen and pelvis was performed following the standard protocol without intravenous contrast.  Patient drank dilute oral contrast for exam. Sagittal and coronal MPR images reconstructed from axial data set.  COMPARISON:  None  FINDINGS: Lung bases clear.  Tiny calcifications throughout pancreas consistent with chronic calcific pancreatitis.  High attenuation lesion at inferior pole right kidney 2.2 x 1.8 x 1.8 cm question high density cyst.  Tiny bilateral nonobstructing renal calculi.  Right hydronephrosis identified with either significant dilatation of the right renal pelvis or less likely a coexistent peripelvic right renal cyst.  No ureteral calcification or dilatation.  Unremarkable bladder.  Liver, spleen, and adrenal glands normal appearance.  Appendix not visualized.  Stomach and bowel loops normal appearance.  Minimal nonspecific stranding of the small bowel mesentery.  Scattered atherosclerotic calcifications aorta.  No mass, adenopathy, free fluid, or free air.  Bones unremarkable.  IMPRESSION: Chronic calcific pancreatitis.  Right hydronephrosis question due to UPJ obstruction, without definite ureteral calcification or dilatation identified.  Cannot exclude a right peripelvic renal cyst.  Probable high attenuation cyst at inferior pole right kidney, consider nonemergent follow-up ultrasound characterization.  Minimal nonspecific stranding of the smaller mesentery of, can be seen with fibrosing mesenteritis.   Electronically Signed   By: Lavonia Dana M.D.   On: 06/18/2013 11:53     Assessment and Plan: JAKELL DELSORDO is a 53 y.o. year old male presenting with BRBPR   GI: Broad ddx for BRBPR. Hemorrhoidal disease higher up on differential. Family did not mention any baseline diverticular disease. This was not noted on imaging. No diarrhea, fever, abd pain. Will hold on abx for now. GI consult pending. Hold NSAIDs, ASA, anticoagulation.  Noted chronic calcific pancreatitis and ? Mesenteritis. Unsure if these are related to overall presentation. LFTs WNL. Hgb stable. Will trend. No gross  blood on rectal exam today. Follow up pending GI recs.   Heme:  Hgb fairly stable in comparison to recent values. ? Anemia of renal disease. Check anemia panel to coorelate.  CV:  hemodynamically stable. Continue home regimen.  Pulm: resp status stable in setting of asthma. Continue home regimen. Renal : baseline stage 2-3 CKD. Cr at baseline. Unclear if UP obstruction is contribution to renal disease. Consult urology for possible recs.  FEN/GI: NS. PPI. NPO.  Prophylaxis: SCDs  Disposition: pending further evaluation  Code Status:Full Code        Shanda Howells MD  Pager: [phone]

## 2014-08-13 ENCOUNTER — Other Ambulatory Visit: Payer: Self-pay | Admitting: Nephrology

## 2014-08-13 DIAGNOSIS — N183 Chronic kidney disease, stage 3 unspecified: Secondary | ICD-10-CM

## 2014-08-13 DIAGNOSIS — I159 Secondary hypertension, unspecified: Secondary | ICD-10-CM

## 2014-08-18 ENCOUNTER — Ambulatory Visit
Admission: RE | Admit: 2014-08-18 | Discharge: 2014-08-18 | Disposition: A | Discharge status: Home / Self Care | Payer: Medicare Other | Source: Ambulatory Visit | Attending: Nephrology | Admitting: Nephrology

## 2014-08-18 DIAGNOSIS — N183 Chronic kidney disease, stage 3 unspecified: Secondary | ICD-10-CM

## 2014-08-18 DIAGNOSIS — I159 Secondary hypertension, unspecified: Secondary | ICD-10-CM

## 2014-09-21 ENCOUNTER — Other Ambulatory Visit: Payer: Self-pay | Admitting: Urology

## 2014-09-21 DIAGNOSIS — N2889 Other specified disorders of kidney and ureter: Secondary | ICD-10-CM

## 2014-09-25 ENCOUNTER — Ambulatory Visit
Admission: RE | Admit: 2014-09-25 | Discharge: 2014-09-25 | Disposition: A | Discharge status: Home / Self Care | Payer: Medicare Other | Source: Ambulatory Visit | Attending: Urology | Admitting: Urology

## 2014-09-25 DIAGNOSIS — N2889 Other specified disorders of kidney and ureter: Secondary | ICD-10-CM

## 2014-09-25 MED ORDER — IOPAMIDOL (ISOVUE-300) INJECTION 61%
125.0000 mL | Freq: Once | INTRAVENOUS | Status: AC | PRN
  Administered 2014-09-25: 125 mL via INTRAVENOUS

## 2015-10-12 DIAGNOSIS — F250 Schizoaffective disorder, bipolar type: Secondary | ICD-10-CM | POA: Diagnosis not present

## 2015-10-26 DIAGNOSIS — F250 Schizoaffective disorder, bipolar type: Secondary | ICD-10-CM | POA: Diagnosis not present

## 2015-11-02 DIAGNOSIS — N184 Chronic kidney disease, stage 4 (severe): Secondary | ICD-10-CM | POA: Diagnosis not present

## 2015-11-02 DIAGNOSIS — I10 Essential (primary) hypertension: Secondary | ICD-10-CM | POA: Diagnosis not present

## 2015-11-10 DIAGNOSIS — F200 Paranoid schizophrenia: Secondary | ICD-10-CM | POA: Diagnosis not present

## 2015-11-16 DIAGNOSIS — K219 Gastro-esophageal reflux disease without esophagitis: Secondary | ICD-10-CM | POA: Diagnosis not present

## 2015-11-16 DIAGNOSIS — Z1159 Encounter for screening for other viral diseases: Secondary | ICD-10-CM | POA: Diagnosis not present

## 2015-11-16 DIAGNOSIS — I10 Essential (primary) hypertension: Secondary | ICD-10-CM | POA: Diagnosis not present

## 2015-11-24 DIAGNOSIS — F200 Paranoid schizophrenia: Secondary | ICD-10-CM | POA: Diagnosis not present

## 2015-12-08 DIAGNOSIS — F250 Schizoaffective disorder, bipolar type: Secondary | ICD-10-CM | POA: Diagnosis not present

## 2015-12-22 DIAGNOSIS — F200 Paranoid schizophrenia: Secondary | ICD-10-CM | POA: Diagnosis not present

## 2016-01-06 DIAGNOSIS — N3010 Interstitial cystitis (chronic) without hematuria: Secondary | ICD-10-CM | POA: Diagnosis not present

## 2016-01-06 DIAGNOSIS — F200 Paranoid schizophrenia: Secondary | ICD-10-CM | POA: Diagnosis not present

## 2016-01-17 DIAGNOSIS — I10 Essential (primary) hypertension: Secondary | ICD-10-CM | POA: Diagnosis not present

## 2016-01-17 DIAGNOSIS — F29 Unspecified psychosis not due to a substance or known physiological condition: Secondary | ICD-10-CM | POA: Diagnosis not present

## 2016-01-17 DIAGNOSIS — K219 Gastro-esophageal reflux disease without esophagitis: Secondary | ICD-10-CM | POA: Diagnosis not present

## 2016-01-17 DIAGNOSIS — J449 Chronic obstructive pulmonary disease, unspecified: Secondary | ICD-10-CM | POA: Diagnosis not present

## 2016-01-17 DIAGNOSIS — I129 Hypertensive chronic kidney disease with stage 1 through stage 4 chronic kidney disease, or unspecified chronic kidney disease: Secondary | ICD-10-CM | POA: Diagnosis not present

## 2016-01-18 DIAGNOSIS — F200 Paranoid schizophrenia: Secondary | ICD-10-CM | POA: Diagnosis not present

## 2016-01-20 DIAGNOSIS — N184 Chronic kidney disease, stage 4 (severe): Secondary | ICD-10-CM | POA: Diagnosis not present

## 2016-01-20 DIAGNOSIS — E559 Vitamin D deficiency, unspecified: Secondary | ICD-10-CM | POA: Diagnosis not present

## 2016-01-24 DIAGNOSIS — N1330 Unspecified hydronephrosis: Secondary | ICD-10-CM | POA: Diagnosis not present

## 2016-01-24 DIAGNOSIS — N184 Chronic kidney disease, stage 4 (severe): Secondary | ICD-10-CM | POA: Diagnosis not present

## 2016-01-24 DIAGNOSIS — I10 Essential (primary) hypertension: Secondary | ICD-10-CM | POA: Diagnosis not present

## 2016-01-24 DIAGNOSIS — E559 Vitamin D deficiency, unspecified: Secondary | ICD-10-CM | POA: Diagnosis not present

## 2016-01-27 ENCOUNTER — Emergency Department (HOSPITAL_COMMUNITY)
Admission: EM | Admit: 2016-01-27 | Discharge: 2016-01-27 | Disposition: A | Discharge status: Home / Self Care | Payer: PPO | Attending: Emergency Medicine | Admitting: Emergency Medicine

## 2016-01-27 ENCOUNTER — Encounter (HOSPITAL_COMMUNITY): Payer: Self-pay

## 2016-01-27 ENCOUNTER — Emergency Department (HOSPITAL_COMMUNITY): Payer: PPO

## 2016-01-27 DIAGNOSIS — R0602 Shortness of breath: Secondary | ICD-10-CM | POA: Diagnosis not present

## 2016-01-27 DIAGNOSIS — Z79899 Other long term (current) drug therapy: Secondary | ICD-10-CM | POA: Diagnosis not present

## 2016-01-27 DIAGNOSIS — N189 Chronic kidney disease, unspecified: Secondary | ICD-10-CM | POA: Insufficient documentation

## 2016-01-27 DIAGNOSIS — Z87891 Personal history of nicotine dependence: Secondary | ICD-10-CM | POA: Insufficient documentation

## 2016-01-27 DIAGNOSIS — J45909 Unspecified asthma, uncomplicated: Secondary | ICD-10-CM | POA: Diagnosis not present

## 2016-01-27 DIAGNOSIS — R609 Edema, unspecified: Secondary | ICD-10-CM

## 2016-01-27 DIAGNOSIS — R109 Unspecified abdominal pain: Secondary | ICD-10-CM | POA: Diagnosis not present

## 2016-01-27 DIAGNOSIS — R600 Localized edema: Secondary | ICD-10-CM | POA: Diagnosis not present

## 2016-01-27 DIAGNOSIS — R079 Chest pain, unspecified: Secondary | ICD-10-CM | POA: Diagnosis not present

## 2016-01-27 DIAGNOSIS — I129 Hypertensive chronic kidney disease with stage 1 through stage 4 chronic kidney disease, or unspecified chronic kidney disease: Secondary | ICD-10-CM | POA: Insufficient documentation

## 2016-01-27 DIAGNOSIS — I447 Left bundle-branch block, unspecified: Secondary | ICD-10-CM

## 2016-01-27 LAB — URINALYSIS, ROUTINE W REFLEX MICROSCOPIC
BILIRUBIN URINE: NEGATIVE
Glucose, UA: NEGATIVE mg/dL
Hgb urine dipstick: NEGATIVE
Ketones, ur: NEGATIVE mg/dL
Leukocytes, UA: NEGATIVE
Nitrite: NEGATIVE
Protein, ur: 30 mg/dL — AB
SPECIFIC GRAVITY, URINE: 1.016 (ref 1.005–1.030)
pH: 6.5 (ref 5.0–8.0)

## 2016-01-27 LAB — COMPREHENSIVE METABOLIC PANEL
ALT: 14 U/L — ABNORMAL LOW (ref 17–63)
ANION GAP: 7 (ref 5–15)
AST: 18 U/L (ref 15–41)
Albumin: 4.3 g/dL (ref 3.5–5.0)
Alkaline Phosphatase: 75 U/L (ref 38–126)
BILIRUBIN TOTAL: 0.6 mg/dL (ref 0.3–1.2)
BUN: 16 mg/dL (ref 6–20)
CO2: 25 mmol/L (ref 22–32)
Calcium: 9.4 mg/dL (ref 8.9–10.3)
Chloride: 106 mmol/L (ref 101–111)
Creatinine, Ser: 1.97 mg/dL — ABNORMAL HIGH (ref 0.61–1.24)
GFR calc non Af Amer: 36 mL/min — ABNORMAL LOW (ref >60)
GFR, EST AFRICAN AMERICAN: 42 mL/min — AB (ref >60)
Glucose, Bld: 108 mg/dL — ABNORMAL HIGH (ref 65–99)
Potassium: 4.4 mmol/L (ref 3.5–5.1)
Sodium: 138 mmol/L (ref 135–145)
TOTAL PROTEIN: 7.6 g/dL (ref 6.5–8.1)

## 2016-01-27 LAB — URINE MICROSCOPIC-ADD ON

## 2016-01-27 LAB — CBC
HCT: 38.1 % — ABNORMAL LOW (ref 39.0–52.0)
HEMOGLOBIN: 12.8 g/dL — AB (ref 13.0–17.0)
MCH: 27.7 pg (ref 26.0–34.0)
MCHC: 33.6 g/dL (ref 30.0–36.0)
MCV: 82.5 fL (ref 78.0–100.0)
Platelets: 252 10*3/uL (ref 150–400)
RBC: 4.62 MIL/uL (ref 4.22–5.81)
RDW: 12.8 % (ref 11.5–15.5)
WBC: 8.7 10*3/uL (ref 4.0–10.5)

## 2016-01-27 LAB — LIPASE, BLOOD: Lipase: 17 U/L (ref 11–51)

## 2016-01-27 NOTE — ED Notes (Signed)
Pt stated "went to the Dr. Sadie Haber, they did an EKG & sent me here.  They also were concerned about my feet swelling.  I have Stage IV kidney disease."

## 2016-01-27 NOTE — Discharge Instructions (Signed)
Follow up with your family doc and discuss likely needing an [place] of your heart.

## 2016-01-27 NOTE — ED Triage Notes (Addendum)
Per EMS, Pt, being sent by [facility], c/o abdominal pain/distention, nausea, and BLE swelling x 4 days.  Pain score 5/10.  Pt denies having any similar symptoms previously.  Hx of Stage 4 Chronic Kidney Disease.   Pt is followed by Joelyn Oms MD at [facility].

## 2016-01-27 NOTE — ED Notes (Signed)
IV attempt x 1 without success.

## 2016-01-27 NOTE — ED Provider Notes (Signed)
[facility] DEPT Provider Note   CSN: [id number] Arrival date & time: 01/27/16  1428     History   Chief Complaint Chief Complaint  Patient presents with  . Abdominal Pain  . Nausea  . Leg Swelling    HPI Peter Marsh is a 55 y.o. male.  56 yo M with a chief complaint of bilateral lower extremity edema. This got worse since he has been taken off of his diuretic by his nephrologist. They stated that his kidney function was getting to the point where he can no longer take that medication. Patient saw his family physician today who is concerned about an EKG finding and that the swelling sent the patient here for evaluation. Patient denies chest pain has had some mild shortness of breath that is not exertional. Has some chronic distention of his abdomen over the past 3 or 4 months.   The history is provided by the patient.  Abdominal Pain   Pertinent negatives include fever, diarrhea, vomiting, headaches, arthralgias and myalgias.  Illness  This is a new problem. The current episode started more than 2 days ago. The problem occurs constantly. The problem has not changed since onset.Associated symptoms include shortness of breath (mild, non exertional). Pertinent negatives include no chest pain, no abdominal pain and no headaches. Nothing aggravates the symptoms. Nothing relieves the symptoms. He has tried nothing for the symptoms. The treatment provided no relief.    Past Medical History:  Diagnosis Date  . Asthma    uses inhalers   . Chronic kidney disease    bladder interstial cystitis   . Depression   . GERD (gastroesophageal reflux disease)   . Hypertension   . PONV (postoperative nausea and vomiting)     Patient Active Problem List   Diagnosis Date Noted  . BRBPR (bright red blood per rectum) 06/18/2013  . Herniated lumbar intervertebral disc 04/20/2011    Past Surgical History:  Procedure Laterality Date  . APPENDECTOMY    . LUMBAR LAMINECTOMY/DECOMPRESSION  MICRODISCECTOMY  04/19/2011   Procedure: LUMBAR LAMINECTOMY/DECOMPRESSION MICRODISCECTOMY;  Surgeon: Tobi Bastos;  Location: [facility];  Service: Orthopedics;  Laterality: Left;  Hemi LAminectomy/Microdiscectomy Lumbar four  - Lumbar five  on the Left (X-Ray)  . TONSILLECTOMY         Home Medications    Prior to Admission medications   Medication Sig Start Date End Date Taking? Authorizing Provider  albuterol (PROVENTIL HFA;VENTOLIN HFA) 108 (90 BASE) MCG/ACT inhaler Inhale 2 puffs into the lungs every 6 (six) hours as needed. Wheezing    Yes Historical Provider, MD  amLODipine (NORVASC) 10 MG tablet Take 10 mg by mouth every morning. 01/17/16  Yes Historical Provider, MD  budesonide-formoterol (SYMBICORT) 160-4.5 MCG/ACT inhaler Inhale 2 puffs into the lungs 2 (two) times daily.    Yes Historical Provider, MD  buPROPion (WELLBUTRIN SR) 150 MG 12 hr tablet Take 150 mg by mouth every morning.    Yes Historical Provider, MD  cholecalciferol (VITAMIN D) 1000 UNITS tablet Take 1,000 Units by mouth every morning.    Yes Historical Provider, MD  losartan-hydrochlorothiazide (HYZAAR) 100-25 MG per tablet Take 1 tablet by mouth every morning.    Yes Historical Provider, MD  metoprolol tartrate (LOPRESSOR) 25 MG tablet Take 25 mg by mouth 2 (two) times daily. 01/17/16  Yes Historical Provider, MD  montelukast (SINGULAIR) 10 MG tablet Take 10 mg by mouth at bedtime. 01/17/16  Yes Historical Provider, MD  ranitidine (ZANTAC) 150 MG tablet Take 150  mg by mouth 2 (two) times daily. 01/11/16  Yes Historical Provider, MD  risperiDONE (RISPERDAL) 1 MG tablet Take 1 mg by mouth at bedtime. 01/06/16  Yes Historical Provider, MD  risperiDONE microspheres (RISPERDAL CONSTA) 25 MG injection Inject 25 mg into the muscle every 14 (fourteen) days.    Yes Historical Provider, MD  vitamin B-12 (CYANOCOBALAMIN) 100 MCG tablet Take 100 mcg by mouth daily.   Yes Historical Provider, MD    Family History Family History    Problem Relation Age of Onset  . High blood pressure Mother   . Alzheimer's disease Father     Social History Social History  Substance Use Topics  . Smoking status: Former Smoker    Years: 30.00    Quit date: 05/08/2008  . Smokeless tobacco: Never Used  . Alcohol use No     Allergies   Methylpyrrolidone; Niacin; Ciprofloxacin; Oxybutynin chloride [oxybutynin chloride er]; and Vesicare [solifenacin succinate]   Review of Systems Review of Systems  Constitutional: Negative for chills and fever.  HENT: Negative for congestion and facial swelling.   Eyes: Negative for discharge and visual disturbance.  Respiratory: Positive for shortness of breath (mild, non exertional).   Cardiovascular: Positive for leg swelling. Negative for chest pain and palpitations.  Gastrointestinal: Negative for abdominal pain, diarrhea and vomiting.  Musculoskeletal: Negative for arthralgias and myalgias.  Skin: Negative for color change and rash.  Neurological: Negative for tremors, syncope and headaches.  Psychiatric/Behavioral: Negative for confusion and dysphoric mood.     Physical Exam Updated Vital Signs BP 129/78 (BP Location: Left Arm)   Pulse 74   Temp 98.4 F (36.9 C) (Oral)   Resp 18   SpO2 98%   Physical Exam  Constitutional: He is oriented to person, place, and time. He appears well-developed and well-nourished.  HENT:  Head: Normocephalic and atraumatic.  Eyes: EOM are normal. Pupils are equal, round, and reactive to light.  Neck: Normal range of motion. Neck supple. No JVD present.  Cardiovascular: Normal rate and regular rhythm.  Exam reveals no gallop and no friction rub.   No murmur heard. No s3, no jvd  Pulmonary/Chest: No respiratory distress. He has no wheezes.  Abdominal: He exhibits no distension. There is no rebound and no guarding.  Musculoskeletal: Normal range of motion. He exhibits edema (2+ up to the shins).  Neurological: He is alert and oriented to person,  place, and time.  Skin: No rash noted. No pallor.  Psychiatric: He has a normal mood and affect. His behavior is normal.  Nursing note and vitals reviewed.    ED Treatments / Results  Labs (all labs ordered are listed, but only abnormal results are displayed) Labs Reviewed  COMPREHENSIVE METABOLIC PANEL - Abnormal; Notable for the following:       Result Value   Glucose, Bld 108 (*)    Creatinine, Ser 1.97 (*)    ALT 14 (*)    GFR calc non Af Amer 36 (*)    GFR calc Af Amer 42 (*)    All other components within normal limits  CBC - Abnormal; Notable for the following:    Hemoglobin 12.8 (*)    HCT 38.1 (*)    All other components within normal limits  URINALYSIS, ROUTINE W REFLEX MICROSCOPIC (NOT AT [facility]) - Abnormal; Notable for the following:    Protein, ur 30 (*)    All other components within normal limits  URINE MICROSCOPIC-ADD ON - Abnormal; Notable for the following:  Squamous Epithelial / LPF 0-5 (*)    Bacteria, UA RARE (*)    All other components within normal limits  LIPASE, BLOOD    EKG  EKG Interpretation None       Radiology Dg Chest 2 View  Result Date: 01/27/2016 CLINICAL DATA:  Chest pain and bilateral lower extremity swelling for 4 days. History of chronic kidney disease. EXAM: CHEST  2 VIEW COMPARISON:  Chest x-ray 04/17/2011. FINDINGS: The cardiac silhouette, mediastinal and hilar contours are within normal limits and stable. Low lung volumes with vascular crowding and streaky basilar atelectasis but no infiltrates, edema or effusions. The bony thorax is intact. Mild degenerative changes involving the thoracic spine. Moderate air noted in the transverse colon. Colonic interposition is noted. A few air-filled small bowel loops are also noted in the upper abdomen. IMPRESSION: No acute cardiopulmonary findings.  Low lung volumes. Electronically Signed   By: Marijo Sanes M.D.   On: 01/27/2016 20:54    Procedures Procedures (including critical care  time)  Medications Ordered in ED Medications - No data to display   Initial Impression / Assessment and Plan / ED Course  I have reviewed the triage vital signs and the nursing notes.  Pertinent labs & imaging results that were available during my care of the patient were reviewed by me and considered in my medical decision making (see chart for details).  Clinical Course    55 yo M With a chief complaint of bilateral lower extremity edema. Sent from the family 74 office for concerning EKG. Will obtain an EKG, chest x-ray. Patient had basic labs performed in triage kidney function is only mildly worse from 2 years ago. No LFT elevation.   Chest x-ray clear. EKG with a left bundle branch block. No scarbossa criteria. Patient has chronic shortness of breath noted chest pain. See no reason for further workup at this time. ECG appears similar to 2015.     9:43 PM:  I have discussed the diagnosis/risks/treatment options with the patient and family and believe the pt to be eligible for discharge home to follow-up with PCP. We also discussed returning to the ED immediately if new or worsening sx occur. We discussed the sx which are most concerning (e.g., sudden worsening pain, fever, inability to tolerate by mouth) that necessitate immediate return. Medications administered to the patient during their visit and any new prescriptions provided to the patient are listed below.  Medications given during this visit Medications - No data to display   The patient appears reasonably screen and/or stabilized for discharge and I doubt any other medical condition or other [facility] requiring further screening, evaluation, or treatment in the ED at this time prior to discharge.    Final Clinical Impressions(s) / ED Diagnoses   Final diagnoses:  LBBB (left bundle branch block)  Peripheral edema    New Prescriptions Discharge Medication List as of 01/27/2016  9:07 PM       Deno Etienne,  DO 01/27/16 2144

## 2016-02-02 DIAGNOSIS — I447 Left bundle-branch block, unspecified: Secondary | ICD-10-CM | POA: Diagnosis not present

## 2016-02-02 DIAGNOSIS — I10 Essential (primary) hypertension: Secondary | ICD-10-CM | POA: Diagnosis not present

## 2016-02-07 DIAGNOSIS — F200 Paranoid schizophrenia: Secondary | ICD-10-CM | POA: Diagnosis not present

## 2016-02-09 ENCOUNTER — Encounter: Payer: Self-pay | Admitting: Internal Medicine

## 2016-02-09 ENCOUNTER — Ambulatory Visit (INDEPENDENT_AMBULATORY_CARE_PROVIDER_SITE_OTHER): Payer: PPO | Admitting: Internal Medicine

## 2016-02-09 VITALS — BP 126/80 | HR 74 | Ht 74.0 in | Wt 228.0 lb

## 2016-02-09 DIAGNOSIS — J45909 Unspecified asthma, uncomplicated: Secondary | ICD-10-CM | POA: Insufficient documentation

## 2016-02-09 DIAGNOSIS — J45991 Cough variant asthma: Secondary | ICD-10-CM

## 2016-02-09 DIAGNOSIS — R0600 Dyspnea, unspecified: Secondary | ICD-10-CM | POA: Diagnosis not present

## 2016-02-09 LAB — NITRIC OXIDE: Nitric Oxide: 6

## 2016-02-09 MED ORDER — BUDESONIDE-FORMOTEROL FUMARATE 80-4.5 MCG/ACT IN AERO: 2.0000 | INHALATION_SPRAY | Freq: Two times a day (BID) | RESPIRATORY_TRACT | 11 refills | Status: DC

## 2016-02-09 NOTE — Progress Notes (Signed)
Subjective:    Patient ID: Peter Marsh, male    DOB: 1961/01/29,     MRN: [id number]  HPI  58 yowm quit smoking 2009  healthy as child but dx as asthma around 2000 rx  symbicort but breathing gradually worse even p quit smoking and referred to pulmonary clinic 02/09/2016 by Tempie Donning PA for copd eval but only had restrictive changes on initial spirometry     02/09/2016 1st [facility] office visit/ Khyrie Masi  On symbicort 160 2bid / singulair  Chief Complaint  Patient presents with  . pulmonary consult    per Marda Stalker. pt states he was dx with copd & asthma aound 2009. pt currenly has sob with exertion, occ non prod cough, wheezing & occ chest discomfort.  dx as cri about the same time as copd  > Dr Joelyn Oms following  Doe x   [facility] = can't walk 100 yards even at a slow pace at a flat grade s stopping due to sob     In past has noted episodes  smothering supine assoc with increased leg swelling but denies now  Presently ventolin 2 x daily  Better in cooler weather, worse in hot   No  Other obvious day to day or daytime variability or assoc excess/ purulent sputum or mucus plugs or hemoptysis or cp or chest tightness, subjective wheeze or overt sinus or hb symptoms. No unusual exp hx or h/o childhood pna/ asthma or knowledge of premature birth.  Sleeping ok without nocturnal  or early am exacerbation  of respiratory  c/o's or need for noct saba. Also denies any obvious fluctuation of symptoms with weather or environmental changes or other aggravating or alleviating factors except as outlined above   Current Medications, Allergies, Complete Past Medical History, Past Surgical History, Family History, and Social History were reviewed in [employer] record.    Review of Systems  Constitutional: Positive for unexpected weight change. Negative for fever.  HENT: Negative for congestion, dental problem, ear pain, nosebleeds, postnasal drip, rhinorrhea,  sinus pressure, sneezing, sore throat and trouble swallowing.   Eyes: Negative for redness and itching.  Respiratory: Positive for cough, chest tightness, shortness of breath and wheezing.   Cardiovascular: Positive for leg swelling. Negative for palpitations.  Gastrointestinal: Positive for nausea and vomiting.  Genitourinary: Negative for dysuria.  Musculoskeletal: Negative for joint swelling.  Skin: Negative for rash.  Neurological: Negative for headaches.  Hematological: Does not bruise/bleed easily.  Psychiatric/Behavioral: Negative for dysphoric mood. The patient is not nervous/anxious.        Objective:   Physical Exam   amb obese wm nad unusual affect  Wt Readings from Last 3 Encounters:  02/09/16 228 lb (103.4 kg)  04/19/11 221 lb 6.4 oz (100.4 kg)  04/17/11 210 lb 8 oz (95.5 kg)    Vital signs reviewed    HEENT: nl dentition, turbinates, and oropharynx. Nl external ear canals without cough reflex   NECK :  without JVD/Nodes/TM/ nl carotid upstrokes bilaterally   LUNGS: no acc muscle use,  Nl contour chest with minimal insp and exp rhonchi    CV:  RRR  no s3 or murmur or increase in P2, no edema   ABD:  soft and nontender with nl inspiratory excursion in the supine position. No bruits or organomegaly, bowel sounds nl  MS:  Nl gait/ ext warm without deformities, calf tenderness, cyanosis or clubbing No obvious joint restrictions   SKIN: warm and dry without lesions  NEURO:  alert, approp, nl sensorium with  no motor deficits      I personally reviewed images and agree with radiology impression as follows:  CXR:   01/27/16 No acute cardiopulmonary findings.  Low lung volumes.  Labs ordered/ reviewed:      Chemistry      Component Value Date/Time   NA 138 01/27/2016 1625   K 4.4 01/27/2016 1625   CL 106 01/27/2016 1625   CO2 25 01/27/2016 1625   BUN 16 01/27/2016 1625   CREATININE 1.97 (H) 01/27/2016 1625      Component Value Date/Time    CALCIUM 9.4 01/27/2016 1625   ALKPHOS 75 01/27/2016 1625   AST 18 01/27/2016 1625   ALT 14 (L) 01/27/2016 1625   BILITOT 0.6 01/27/2016 1625        Lab Results  Component Value Date   WBC 8.7 01/27/2016   HGB 12.8 (L) 01/27/2016   HCT 38.1 (L) 01/27/2016   MCV 82.5 01/27/2016   PLT 252 01/27/2016          Assessment & Plan:

## 2016-02-09 NOTE — Assessment & Plan Note (Addendum)
Spirometry 02/09/2016  Restrictive only   Symptoms are markedly disproportionate to objective findings and not clear this is a lung problem but pt does appear to have difficult airway management issues. DDX of  difficult airways management almost all start with A and  include Adherence, Ace Inhibitors, Acid Reflux, Active Sinus Disease, Alpha 1 Antitripsin deficiency, Anxiety masquerading as Airways dz,  ABPA,  Allergy(esp in young), Aspiration (esp in elderly), Adverse effects of meds,  Active smokers, A bunch of PE's (a small clot burden can't cause this syndrome unless there is already severe underlying pulm or vascular dz with poor reserve) plus two Bs  = Bronchiectasis and Beta blocker use..and one C= CHF  Adherence is always the initial "prime suspect" and is a multilayered concern that requires a "trust but verify" approach in every patient - starting with knowing how to use medications, especially inhalers, correctly, keeping up with refills and understanding the fundamental difference between maintenance and prns vs those medications only taken for a very short course and then stopped and not refilled.  - see asthma re mdi misuse  ? Acid (or non-acid) GERD > always difficult to exclude as up to 75% of pts in some series report no assoc GI/ Heartburn symptoms> rec  diet restrictions/ reviewed and instructions given I   ? Allergy/asthm >  (see separate a/p)   ? Anxiety > usually at the bottom of this list of usual suspects but should be much higher on this pt's based on H and P and note already on psychotropics .    ? Chf/ vol overload : Note much better s orthopnea since peripheral edema resolved so needs to be kept euvolemic if possible given issue of CRI to balance    For now will focus on rx for possible asthma and f/u in 6 weeks  Total time devoted to counseling  = 35/51m review case with pt/wife  discussion of options/alternatives/ personally creating written instructions  in presence  of pt  then going over those specific  Instructions directly with the pt including how to use all of the meds but in particular covering each new medication in detail and the difference between the maintenance/automatic meds and the prns using an action plan format for the latter.

## 2016-02-09 NOTE — Patient Instructions (Addendum)
Plan A = Automatic = Symbicort 80 Take 2 puffs first thing in am and then another 2 puffs about 12 hours later.    Work on inhaler technique:  relax and gently blow all the way out then take a nice smooth deep breath back in, triggering the inhaler at same time you start breathing in.  Hold for up to 5 seconds if you can. Blow out thru nose. Rinse and gargle with water when done       Plan B = Backup Only use your albuterol (ventolin) as a rescue medication to be used if you can't catch your breath by resting or doing a relaxed purse lip breathing pattern.  - The less you use it, the better it will work when you need it. - Ok to use the inhaler up to 2 puffs  every 4 hours if you must but call for appointment if use goes up over your usual need - Don't leave home without it !!  (think of it like the spare tire for your car)    Please schedule a follow up office visit in 6 weeks, call sooner if needed

## 2016-02-09 NOTE — Assessment & Plan Note (Signed)
FENO 02/09/2016  =   6 p am symbicort 160 - Spirometry 02/09/2016  Restrictive only p am symb 160  02/09/2016  After extensive coaching HFA effectiveness =    75% > try symb 80 2bid    Clearly this is not copd and paradoxically may do better on a lower or no dose of symbicort so rec try the 80 2bid and f/u in 6 weeks

## 2016-02-18 DIAGNOSIS — I447 Left bundle-branch block, unspecified: Secondary | ICD-10-CM | POA: Diagnosis not present

## 2016-02-18 DIAGNOSIS — R600 Localized edema: Secondary | ICD-10-CM | POA: Diagnosis not present

## 2016-02-18 DIAGNOSIS — N184 Chronic kidney disease, stage 4 (severe): Secondary | ICD-10-CM | POA: Diagnosis not present

## 2016-02-18 DIAGNOSIS — R0602 Shortness of breath: Secondary | ICD-10-CM | POA: Diagnosis not present

## 2016-02-22 DIAGNOSIS — F250 Schizoaffective disorder, bipolar type: Secondary | ICD-10-CM | POA: Diagnosis not present

## 2016-03-13 DIAGNOSIS — I447 Left bundle-branch block, unspecified: Secondary | ICD-10-CM | POA: Diagnosis not present

## 2016-03-13 DIAGNOSIS — R0602 Shortness of breath: Secondary | ICD-10-CM | POA: Diagnosis not present

## 2016-03-22 ENCOUNTER — Ambulatory Visit: Payer: Medicare Other | Admitting: Internal Medicine

## 2016-03-22 DIAGNOSIS — F250 Schizoaffective disorder, bipolar type: Secondary | ICD-10-CM | POA: Diagnosis not present

## 2016-03-28 DIAGNOSIS — I447 Left bundle-branch block, unspecified: Secondary | ICD-10-CM | POA: Diagnosis not present

## 2016-03-28 DIAGNOSIS — R0989 Other specified symptoms and signs involving the circulatory and respiratory systems: Secondary | ICD-10-CM | POA: Diagnosis not present

## 2016-03-28 DIAGNOSIS — R0602 Shortness of breath: Secondary | ICD-10-CM | POA: Diagnosis not present

## 2016-04-05 DIAGNOSIS — N184 Chronic kidney disease, stage 4 (severe): Secondary | ICD-10-CM | POA: Diagnosis not present

## 2016-04-05 DIAGNOSIS — R0602 Shortness of breath: Secondary | ICD-10-CM | POA: Diagnosis not present

## 2016-04-05 DIAGNOSIS — I4892 Unspecified atrial flutter: Secondary | ICD-10-CM | POA: Diagnosis not present

## 2016-04-05 DIAGNOSIS — I447 Left bundle-branch block, unspecified: Secondary | ICD-10-CM | POA: Diagnosis not present

## 2016-04-07 ENCOUNTER — Ambulatory Visit: Payer: Medicare Other | Admitting: Internal Medicine

## 2016-04-07 DIAGNOSIS — I483 Typical atrial flutter: Secondary | ICD-10-CM | POA: Diagnosis not present

## 2016-04-14 ENCOUNTER — Ambulatory Visit: Payer: Medicare Other | Admitting: Internal Medicine

## 2016-04-19 DIAGNOSIS — F200 Paranoid schizophrenia: Secondary | ICD-10-CM | POA: Diagnosis not present

## 2016-04-28 ENCOUNTER — Ambulatory Visit: Payer: Medicare Other | Admitting: Internal Medicine

## 2016-05-06 DIAGNOSIS — I483 Typical atrial flutter: Secondary | ICD-10-CM | POA: Diagnosis not present

## 2016-05-08 DIAGNOSIS — I459 Conduction disorder, unspecified: Secondary | ICD-10-CM

## 2016-05-08 HISTORY — DX: Conduction disorder, unspecified: I45.9

## 2016-05-17 DIAGNOSIS — R0602 Shortness of breath: Secondary | ICD-10-CM | POA: Diagnosis not present

## 2016-05-17 DIAGNOSIS — I471 Supraventricular tachycardia: Secondary | ICD-10-CM | POA: Diagnosis not present

## 2016-05-17 DIAGNOSIS — I447 Left bundle-branch block, unspecified: Secondary | ICD-10-CM | POA: Diagnosis not present

## 2016-05-17 DIAGNOSIS — F200 Paranoid schizophrenia: Secondary | ICD-10-CM | POA: Diagnosis not present

## 2016-05-17 DIAGNOSIS — R600 Localized edema: Secondary | ICD-10-CM | POA: Diagnosis not present

## 2016-06-07 ENCOUNTER — Institutional Professional Consult (permissible substitution): Payer: Medicare Other | Admitting: Internal Medicine

## 2016-06-14 ENCOUNTER — Institutional Professional Consult (permissible substitution): Payer: Medicare Other | Admitting: Internal Medicine

## 2016-06-16 ENCOUNTER — Emergency Department (HOSPITAL_COMMUNITY)
Admission: EM | Admit: 2016-06-16 | Discharge: 2016-06-17 | Disposition: A | Discharge status: Home / Self Care | Payer: PPO | Attending: Emergency Medicine | Admitting: Emergency Medicine

## 2016-06-16 ENCOUNTER — Encounter: Payer: Self-pay | Admitting: Internal Medicine

## 2016-06-16 DIAGNOSIS — Z79899 Other long term (current) drug therapy: Secondary | ICD-10-CM | POA: Insufficient documentation

## 2016-06-16 DIAGNOSIS — I129 Hypertensive chronic kidney disease with stage 1 through stage 4 chronic kidney disease, or unspecified chronic kidney disease: Secondary | ICD-10-CM | POA: Insufficient documentation

## 2016-06-16 DIAGNOSIS — N200 Calculus of kidney: Secondary | ICD-10-CM

## 2016-06-16 DIAGNOSIS — J45909 Unspecified asthma, uncomplicated: Secondary | ICD-10-CM | POA: Insufficient documentation

## 2016-06-16 DIAGNOSIS — R319 Hematuria, unspecified: Secondary | ICD-10-CM | POA: Diagnosis not present

## 2016-06-16 DIAGNOSIS — N189 Chronic kidney disease, unspecified: Secondary | ICD-10-CM | POA: Insufficient documentation

## 2016-06-16 DIAGNOSIS — Z87891 Personal history of nicotine dependence: Secondary | ICD-10-CM | POA: Diagnosis not present

## 2016-06-16 DIAGNOSIS — N132 Hydronephrosis with renal and ureteral calculous obstruction: Secondary | ICD-10-CM | POA: Diagnosis not present

## 2016-06-16 LAB — CBC
HCT: 38.8 % — ABNORMAL LOW (ref 39.0–52.0)
Hemoglobin: 12.9 g/dL — ABNORMAL LOW (ref 13.0–17.0)
MCH: 28.4 pg (ref 26.0–34.0)
MCHC: 33.2 g/dL (ref 30.0–36.0)
MCV: 85.3 fL (ref 78.0–100.0)
Platelets: 248 10*3/uL (ref 150–400)
RBC: 4.55 MIL/uL (ref 4.22–5.81)
RDW: 13 % (ref 11.5–15.5)
WBC: 8.7 10*3/uL (ref 4.0–10.5)

## 2016-06-16 LAB — URINALYSIS, MICROSCOPIC (REFLEX): Bacteria, UA: NONE SEEN

## 2016-06-16 NOTE — ED Triage Notes (Signed)
Pt states he has chronic kidney disease and today developed hematuria.  Pt also c/o b/l flank pain x 1 week. +fever/emesis.  Pt rates pain 10/10.

## 2016-06-17 ENCOUNTER — Emergency Department (HOSPITAL_COMMUNITY): Payer: PPO

## 2016-06-17 DIAGNOSIS — R319 Hematuria, unspecified: Secondary | ICD-10-CM | POA: Diagnosis not present

## 2016-06-17 LAB — BASIC METABOLIC PANEL
Anion gap: 6 (ref 5–15)
BUN: 17 mg/dL (ref 6–20)
CALCIUM: 9.3 mg/dL (ref 8.9–10.3)
CO2: 25 mmol/L (ref 22–32)
CREATININE: 2 mg/dL — AB (ref 0.61–1.24)
Chloride: 105 mmol/L (ref 101–111)
GFR calc Af Amer: 41 mL/min — ABNORMAL LOW (ref >60)
GFR calc non Af Amer: 35 mL/min — ABNORMAL LOW (ref >60)
Glucose, Bld: 105 mg/dL — ABNORMAL HIGH (ref 65–99)
Potassium: 4.7 mmol/L (ref 3.5–5.1)
SODIUM: 136 mmol/L (ref 135–145)

## 2016-06-17 LAB — URINALYSIS, ROUTINE W REFLEX MICROSCOPIC

## 2016-06-17 MED ORDER — FENTANYL CITRATE (PF) 100 MCG/2ML IJ SOLN
25.0000 ug | Freq: Once | INTRAMUSCULAR | Status: AC
  Administered 2016-06-17: 25 ug via INTRAVENOUS
  Filled 2016-06-17: qty 2

## 2016-06-17 MED ORDER — TAMSULOSIN HCL 0.4 MG PO CAPS: 0.4000 mg | ORAL_CAPSULE | Freq: Every day | ORAL | 0 refills | Status: DC

## 2016-06-17 MED ORDER — ONDANSETRON 4 MG PO TBDP: 4.0000 mg | ORAL_TABLET | Freq: Three times a day (TID) | ORAL | 0 refills | Status: DC | PRN

## 2016-06-17 MED ORDER — OXYCODONE-ACETAMINOPHEN 5-325 MG PO TABS: 1.0000 | ORAL_TABLET | Freq: Four times a day (QID) | ORAL | 0 refills | Status: DC | PRN

## 2016-06-17 MED ORDER — ONDANSETRON HCL 4 MG/2ML IJ SOLN
4.0000 mg | Freq: Once | INTRAMUSCULAR | Status: AC
  Administered 2016-06-17: 4 mg via INTRAVENOUS
  Filled 2016-06-17: qty 2

## 2016-06-17 NOTE — ED Provider Notes (Signed)
[facility] DEPT Provider Note   CSN: [id number] Arrival date & time: 06/16/16  2044  By signing my name below, I, [name], attest that this documentation has been prepared under the direction and in the presence of non-physician practitioner, Antonietta Breach, PA-C. Electronically Signed: [name], Scribe. 06/17/2016. 12:39 AM.   History   Chief Complaint Chief Complaint  Patient presents with  . Hematuria    The history is provided by the patient. No language interpreter was used.   HPI Comments: Peter Marsh is a 56 y.o. male with a PMHx of chronic kidney disease who presents to the Emergency Department complaining of hematuria that started today. He states he noticed blood in his urine 2-3 times with clots. No modifying factors. He reports associated lightheadedness, SOB, back pain, dysuria, nausea, and vomiting. He denies any hx of kidney stones or other complaints.   PCP: Marda Stalker, PA-C Nephrologist: [facility]    Past Medical History:  Diagnosis Date  . Asthma    uses inhalers   . Chronic kidney disease    bladder interstial cystitis   . Depression   . GERD (gastroesophageal reflux disease)   . Hypertension   . PONV (postoperative nausea and vomiting)     Patient Active Problem List   Diagnosis Date Noted  . Cough variant asthma 02/09/2016  . Dyspnea 02/09/2016  . BRBPR (bright red blood per rectum) 06/18/2013  . Herniated lumbar intervertebral disc 04/20/2011    Past Surgical History:  Procedure Laterality Date  . APPENDECTOMY    . LUMBAR LAMINECTOMY/DECOMPRESSION MICRODISCECTOMY  04/19/2011   Procedure: LUMBAR LAMINECTOMY/DECOMPRESSION MICRODISCECTOMY;  Surgeon: Tobi Bastos;  Location: [facility] ORS;  Service: Orthopedics;  Laterality: Left;  Hemi LAminectomy/Microdiscectomy Lumbar four  - Lumbar five  on the Left (X-Ray)  . TONSILLECTOMY         Home Medications    Prior to Admission medications   Medication Sig Start Date End  Date Taking? Authorizing Provider  albuterol (PROVENTIL HFA;VENTOLIN HFA) 108 (90 BASE) MCG/ACT inhaler Inhale 2 puffs into the lungs every 6 (six) hours as needed. Wheezing    Yes Historical Provider, MD  budesonide-formoterol (SYMBICORT) 80-4.5 MCG/ACT inhaler Inhale 2 puffs into the lungs 2 (two) times daily. Patient taking differently: Inhale 2 puffs into the lungs 2 (two) times daily as needed (SOB, wheezing).  02/09/16  Yes Tanda Rockers, MD  buPROPion [facility] SR) 150 MG 12 hr tablet Take 150 mg by mouth every morning.    Yes Historical Provider, MD  ELIQUIS 5 MG TABS tablet Take 5 mg by mouth 2 (two) times daily. 06/01/16  Yes Historical Provider, MD  hydrALAZINE (APRESOLINE) 50 MG tablet Take 50 mg by mouth 3 (three) times daily. 04/11/16  Yes Historical Provider, MD  INVEGA SUSTENNA 156 MG/ML SUSP injection Inject 156 mg as directed every 30 (thirty) days. 05/20/16  Yes Historical Provider, MD  montelukast (SINGULAIR) 10 MG tablet Take 10 mg by mouth at bedtime. 01/17/16  Yes Historical Provider, MD  ranitidine (ZANTAC) 150 MG tablet Take 150 mg by mouth 2 (two) times daily. 01/11/16  Yes Historical Provider, MD  risperiDONE (RISPERDAL) 1 MG tablet Take 1 mg by mouth at bedtime. 01/06/16  Yes Historical Provider, MD  ondansetron (ZOFRAN ODT) 4 MG disintegrating tablet Take 1 tablet (4 mg total) by mouth every 8 (eight) hours as needed for nausea or vomiting. 06/17/16   Antonietta Breach, PA-C  oxyCODONE-acetaminophen (PERCOCET/ROXICET) 5-325 MG tablet Take 1-2 tablets by mouth every  6 (six) hours as needed for severe pain. 06/17/16   Antonietta Breach, PA-C  tamsulosin (FLOMAX) 0.4 MG CAPS capsule Take 1 capsule (0.4 mg total) by mouth daily. 06/17/16   Antonietta Breach, PA-C    Family History Family History  Problem Relation Age of Onset  . High blood pressure Mother   . Alzheimer's disease Father     Social History Social History  Substance Use Topics  . Smoking status: Former Smoker    Packs/day:  1.00    Years: 30.00    Quit date: 05/08/2008  . Smokeless tobacco: Never Used  . Alcohol use No     Allergies   Methylpyrrolidone; Niacin; Ciprofloxacin; Oxybutynin chloride [oxybutynin chloride er]; and Vesicare [solifenacin succinate]   Review of Systems Review of Systems A complete 10 system review of systems was obtained and all systems are negative except as noted in the HPI and PMH.     Physical Exam Updated Vital Signs BP 131/88   Pulse 64   Temp 97.7 F (36.5 C) (Oral)   Resp 18   Ht 6' (1.829 m)   Wt 106.6 kg   SpO2 94%   BMI 31.87 kg/m   Physical Exam  Constitutional: He is oriented to person, place, and time. He appears well-developed and well-nourished. No distress.  Patient calm, cooperative  HENT:  Head: Normocephalic and atraumatic.  Eyes: Conjunctivae and EOM are normal. No scleral icterus.  Neck: Normal range of motion.  Cardiovascular: Normal rate, regular rhythm and intact distal pulses.   Pulmonary/Chest: Effort normal. No respiratory distress. He has no wheezes.  Respirations even and unlabored. Lungs clear to auscultation bilaterally.  Abdominal:  No significant, focal tenderness to the abdomen. No rigidity or peritoneal signs.  Musculoskeletal: Normal range of motion.  Neurological: He is alert and oriented to person, place, and time. He exhibits normal muscle tone. Coordination normal.  Skin: Skin is warm and dry. No rash noted. He is not diaphoretic. No erythema. No pallor.  Psychiatric: He has a normal mood and affect. His behavior is normal.  Nursing note and vitals reviewed.    ED Treatments / Results  DIAGNOSTIC STUDIES: Oxygen Saturation is 97% on RA, normal by my interpretation.    COORDINATION OF CARE: 12:43 AM- Pt advised of plan for treatment and pt agrees.  Labs (all labs ordered are listed, but only abnormal results are displayed) Labs Reviewed  URINALYSIS, ROUTINE W REFLEX MICROSCOPIC - Abnormal; Notable for the  following:       Result Value   Color, Urine RED (*)    APPearance TURBID (*)    Glucose, UA   (*)    Value: TEST NOT REPORTED DUE TO COLOR INTERFERENCE OF URINE PIGMENT   Hgb urine dipstick   (*)    Value: TEST NOT REPORTED DUE TO COLOR INTERFERENCE OF URINE PIGMENT   Bilirubin Urine   (*)    Value: TEST NOT REPORTED DUE TO COLOR INTERFERENCE OF URINE PIGMENT   Ketones, ur   (*)    Value: TEST NOT REPORTED DUE TO COLOR INTERFERENCE OF URINE PIGMENT   Protein, ur   (*)    Value: TEST NOT REPORTED DUE TO COLOR INTERFERENCE OF URINE PIGMENT   Nitrite   (*)    Value: TEST NOT REPORTED DUE TO COLOR INTERFERENCE OF URINE PIGMENT   Leukocytes, UA   (*)    Value: TEST NOT REPORTED DUE TO COLOR INTERFERENCE OF URINE PIGMENT   All other components within normal  limits  CBC - Abnormal; Notable for the following:    Hemoglobin 12.9 (*)    HCT 38.8 (*)    All other components within normal limits  URINALYSIS, MICROSCOPIC (REFLEX) - Abnormal; Notable for the following:    Squamous Epithelial / LPF 0-5 (*)    All other components within normal limits  BASIC METABOLIC PANEL - Abnormal; Notable for the following:    Glucose, Bld 105 (*)    Creatinine, Ser 2.00 (*)    GFR calc non Af Amer 35 (*)    GFR calc Af Amer 41 (*)    All other components within normal limits  URINE CULTURE    EKG  EKG Interpretation None       Radiology Ct Renal Stone Study  Result Date: 06/17/2016 CLINICAL DATA:  Back pain and hematuria. History of chronic kidney disease. EXAM: CT ABDOMEN AND PELVIS WITHOUT CONTRAST TECHNIQUE: Multidetector CT imaging of the abdomen and pelvis was performed following the standard protocol without IV contrast. COMPARISON:  09/25/2014 FINDINGS: Lower chest: Lung bases are clear. Hepatobiliary: No focal liver abnormality is seen. No gallstones, gallbladder wall thickening, or biliary dilatation. Pancreas: Diffuse pancreatic calcification consistent with chronic pancreatitis. No  evidence of acute inflammation or ductal dilatation. Spleen: Normal in size without focal abnormality. Adrenals/Urinary Tract: No adrenal gland nodules. Prominent right renal hydronephrosis with decompressed right ureter suggesting chronic UPJ obstruction. Appearance is unchanged since prior study. Multiple stones are demonstrated in the right kidney and collecting system, including 5 mm stones 1 of the larger stones is at the ureteropelvic junction and may be contributing to the hydronephrosis but the degree of distention and stranding around the right kidney is not changed since the prior study. 3.2 cm isodense nodule arising from the lower pole of the right kidney has been present previously and was characterized on the previous contrast-enhanced scan as a Bosniak type IIF lesion. MRI in 3-6 months was recommended. Small stones in the left kidney without hydronephrosis. Small left renal cyst. Bladder is decompressed. In the right renal pelvis. Stomach/Bowel: Stomach, small bowel, and colon are not abnormally distended. No wall thickening is appreciated. Appendix is surgically absent. Vascular/Lymphatic: Aortic atherosclerosis. No enlarged abdominal or pelvic lymph nodes. Reproductive: Prostate is unremarkable. Other: No abdominal wall hernia or abnormality. No abdominopelvic ascites. Musculoskeletal: Degenerative changes in the spine. No destructive bone lesions IMPRESSION: Multiple bilateral renal stones. Right-sided hydronephrosis is unchanged since prior study suggesting chronic ureteropelvic junction obstruction. There is a stone at the ureteropelvic junction today which may contribute to the obstruction but the degree of obstruction is unchanged since prior study. 3.2 cm isodense lesion off the lower pole of the right kidney is unchanged since previous study and was previously characterized as a Bosniak type IIF lesion. MRI in 3-6 months was recommended. Pancreatic calcification consistent with chronic  pancreatitis. Electronically Signed   By: Lucienne Capers M.D.   On: 06/17/2016 02:25    Procedures Procedures (including critical care time)  Medications Ordered in ED Medications  ondansetron (ZOFRAN) injection 4 mg (4 mg Intravenous Given 06/17/16 0058)  fentaNYL (SUBLIMAZE) injection 25 mcg (25 mcg Intravenous Given 06/17/16 0058)     Initial Impression / Assessment and Plan / ED Course  I have reviewed the triage vital signs and the nursing notes.  Pertinent labs & imaging results that were available during my care of the patient were reviewed by me and considered in my medical decision making (see chart for details).  Pt has been diagnosed with a right UPJ kidney stone via CT. There is no evidence of significant hydronephrosis, serum creatine at baseline, vitals sign stable and the pt does not have irratractable vomiting. He states that his hematuria resolved on his most recent void. Calm and comfortable after medications. Pt will be discharged home with pain medications and has been advised to follow up with his urologist. Return precautions discussed and provided. Patient discharged in stable condition with no unaddressed concerns.   Final Clinical Impressions(s) / ED Diagnoses   Final diagnoses:  Kidney stone    New Prescriptions New Prescriptions   ONDANSETRON (ZOFRAN ODT) 4 MG DISINTEGRATING TABLET    Take 1 tablet (4 mg total) by mouth every 8 (eight) hours as needed for nausea or vomiting.   OXYCODONE-ACETAMINOPHEN (PERCOCET/ROXICET) 5-325 MG TABLET    Take 1-2 tablets by mouth every 6 (six) hours as needed for severe pain.   TAMSULOSIN (FLOMAX) 0.4 MG CAPS CAPSULE    Take 1 capsule (0.4 mg total) by mouth daily.   I personally performed the services described in this documentation, which was scribed in my presence. The recorded information has been reviewed and is accurate.       Antonietta Breach, PA-C 06/17/16 [id number]    April Palumbo, MD 06/17/16 [phone]

## 2016-06-18 LAB — URINE CULTURE

## 2016-06-21 ENCOUNTER — Institutional Professional Consult (permissible substitution): Payer: Medicare Other | Admitting: Internal Medicine

## 2016-06-30 DIAGNOSIS — N183 Chronic kidney disease, stage 3 (moderate): Secondary | ICD-10-CM | POA: Diagnosis not present

## 2016-07-03 DIAGNOSIS — F200 Paranoid schizophrenia: Secondary | ICD-10-CM | POA: Diagnosis not present

## 2016-07-05 DIAGNOSIS — N184 Chronic kidney disease, stage 4 (severe): Secondary | ICD-10-CM | POA: Diagnosis not present

## 2016-07-05 DIAGNOSIS — E559 Vitamin D deficiency, unspecified: Secondary | ICD-10-CM | POA: Diagnosis not present

## 2016-07-05 DIAGNOSIS — I10 Essential (primary) hypertension: Secondary | ICD-10-CM | POA: Diagnosis not present

## 2016-07-05 DIAGNOSIS — N1330 Unspecified hydronephrosis: Secondary | ICD-10-CM | POA: Diagnosis not present

## 2016-07-05 DIAGNOSIS — Z6832 Body mass index (BMI) 32.0-32.9, adult: Secondary | ICD-10-CM | POA: Diagnosis not present

## 2016-07-10 ENCOUNTER — Institutional Professional Consult (permissible substitution): Payer: PPO | Admitting: Internal Medicine

## 2016-07-20 DIAGNOSIS — K219 Gastro-esophageal reflux disease without esophagitis: Secondary | ICD-10-CM | POA: Diagnosis not present

## 2016-07-20 DIAGNOSIS — I129 Hypertensive chronic kidney disease with stage 1 through stage 4 chronic kidney disease, or unspecified chronic kidney disease: Secondary | ICD-10-CM | POA: Diagnosis not present

## 2016-07-20 DIAGNOSIS — R10817 Generalized abdominal tenderness: Secondary | ICD-10-CM | POA: Diagnosis not present

## 2016-07-20 DIAGNOSIS — R112 Nausea with vomiting, unspecified: Secondary | ICD-10-CM | POA: Diagnosis not present

## 2016-07-25 ENCOUNTER — Institutional Professional Consult (permissible substitution): Payer: PPO | Admitting: Internal Medicine

## 2016-08-01 DIAGNOSIS — F200 Paranoid schizophrenia: Secondary | ICD-10-CM | POA: Diagnosis not present

## 2016-08-07 DIAGNOSIS — K861 Other chronic pancreatitis: Secondary | ICD-10-CM | POA: Diagnosis not present

## 2016-08-07 DIAGNOSIS — K219 Gastro-esophageal reflux disease without esophagitis: Secondary | ICD-10-CM | POA: Diagnosis not present

## 2016-08-07 DIAGNOSIS — N189 Chronic kidney disease, unspecified: Secondary | ICD-10-CM | POA: Diagnosis not present

## 2016-08-07 DIAGNOSIS — F250 Schizoaffective disorder, bipolar type: Secondary | ICD-10-CM | POA: Diagnosis not present

## 2016-08-07 DIAGNOSIS — I4892 Unspecified atrial flutter: Secondary | ICD-10-CM | POA: Diagnosis not present

## 2016-08-14 ENCOUNTER — Institutional Professional Consult (permissible substitution): Payer: PPO | Admitting: Internal Medicine

## 2016-08-28 DIAGNOSIS — F200 Paranoid schizophrenia: Secondary | ICD-10-CM | POA: Diagnosis not present

## 2016-09-16 ENCOUNTER — Encounter (HOSPITAL_COMMUNITY): Payer: Self-pay | Admitting: Emergency Medicine

## 2016-09-16 ENCOUNTER — Inpatient Hospital Stay (HOSPITAL_COMMUNITY)
Admission: EM | Admit: 2016-09-16 | Discharge: 2016-09-19 | DRG: 381 | Disposition: A | Discharge status: Home / Self Care | Payer: PPO | Attending: Internal Medicine | Admitting: Internal Medicine

## 2016-09-16 ENCOUNTER — Inpatient Hospital Stay (HOSPITAL_COMMUNITY): Payer: PPO

## 2016-09-16 DIAGNOSIS — Z7901 Long term (current) use of anticoagulants: Secondary | ICD-10-CM | POA: Diagnosis not present

## 2016-09-16 DIAGNOSIS — K2210 Ulcer of esophagus without bleeding: Secondary | ICD-10-CM | POA: Diagnosis not present

## 2016-09-16 DIAGNOSIS — N184 Chronic kidney disease, stage 4 (severe): Secondary | ICD-10-CM | POA: Diagnosis present

## 2016-09-16 DIAGNOSIS — K920 Hematemesis: Secondary | ICD-10-CM

## 2016-09-16 DIAGNOSIS — K2270 Barrett's esophagus without dysplasia: Secondary | ICD-10-CM | POA: Diagnosis not present

## 2016-09-16 DIAGNOSIS — E876 Hypokalemia: Secondary | ICD-10-CM | POA: Diagnosis present

## 2016-09-16 DIAGNOSIS — K922 Gastrointestinal hemorrhage, unspecified: Secondary | ICD-10-CM | POA: Diagnosis not present

## 2016-09-16 DIAGNOSIS — Z888 Allergy status to other drugs, medicaments and biological substances status: Secondary | ICD-10-CM

## 2016-09-16 DIAGNOSIS — N1832 Chronic kidney disease, stage 3b: Secondary | ICD-10-CM | POA: Diagnosis present

## 2016-09-16 DIAGNOSIS — Z881 Allergy status to other antibiotic agents status: Secondary | ICD-10-CM

## 2016-09-16 DIAGNOSIS — J45909 Unspecified asthma, uncomplicated: Secondary | ICD-10-CM | POA: Diagnosis present

## 2016-09-16 DIAGNOSIS — I480 Paroxysmal atrial fibrillation: Secondary | ICD-10-CM | POA: Diagnosis present

## 2016-09-16 DIAGNOSIS — Z820 Family history of epilepsy and other diseases of the nervous system: Secondary | ICD-10-CM

## 2016-09-16 DIAGNOSIS — K2951 Unspecified chronic gastritis with bleeding: Secondary | ICD-10-CM | POA: Diagnosis not present

## 2016-09-16 DIAGNOSIS — K2970 Gastritis, unspecified, without bleeding: Secondary | ICD-10-CM | POA: Diagnosis not present

## 2016-09-16 DIAGNOSIS — I447 Left bundle-branch block, unspecified: Secondary | ICD-10-CM | POA: Diagnosis not present

## 2016-09-16 DIAGNOSIS — R110 Nausea: Secondary | ICD-10-CM

## 2016-09-16 DIAGNOSIS — Z8711 Personal history of peptic ulcer disease: Secondary | ICD-10-CM

## 2016-09-16 DIAGNOSIS — K228 Other specified diseases of esophagus: Secondary | ICD-10-CM | POA: Diagnosis not present

## 2016-09-16 DIAGNOSIS — B182 Chronic viral hepatitis C: Secondary | ICD-10-CM | POA: Diagnosis present

## 2016-09-16 DIAGNOSIS — D62 Acute posthemorrhagic anemia: Secondary | ICD-10-CM | POA: Diagnosis not present

## 2016-09-16 DIAGNOSIS — Z7951 Long term (current) use of inhaled steroids: Secondary | ICD-10-CM

## 2016-09-16 DIAGNOSIS — Z87891 Personal history of nicotine dependence: Secondary | ICD-10-CM

## 2016-09-16 DIAGNOSIS — R1110 Vomiting, unspecified: Secondary | ICD-10-CM

## 2016-09-16 DIAGNOSIS — N183 Chronic kidney disease, stage 3 unspecified: Secondary | ICD-10-CM | POA: Diagnosis present

## 2016-09-16 DIAGNOSIS — I4892 Unspecified atrial flutter: Secondary | ICD-10-CM | POA: Diagnosis not present

## 2016-09-16 DIAGNOSIS — I129 Hypertensive chronic kidney disease with stage 1 through stage 4 chronic kidney disease, or unspecified chronic kidney disease: Secondary | ICD-10-CM | POA: Diagnosis not present

## 2016-09-16 DIAGNOSIS — K861 Other chronic pancreatitis: Secondary | ICD-10-CM | POA: Diagnosis present

## 2016-09-16 DIAGNOSIS — F209 Schizophrenia, unspecified: Secondary | ICD-10-CM | POA: Diagnosis not present

## 2016-09-16 DIAGNOSIS — K219 Gastro-esophageal reflux disease without esophagitis: Secondary | ICD-10-CM | POA: Diagnosis present

## 2016-09-16 DIAGNOSIS — R112 Nausea with vomiting, unspecified: Secondary | ICD-10-CM | POA: Diagnosis not present

## 2016-09-16 DIAGNOSIS — J45991 Cough variant asthma: Secondary | ICD-10-CM | POA: Diagnosis not present

## 2016-09-16 DIAGNOSIS — K2211 Ulcer of esophagus with bleeding: Secondary | ICD-10-CM | POA: Diagnosis not present

## 2016-09-16 DIAGNOSIS — Z8249 Family history of ischemic heart disease and other diseases of the circulatory system: Secondary | ICD-10-CM

## 2016-09-16 HISTORY — DX: Hematemesis: K92.0

## 2016-09-16 LAB — TYPE AND SCREEN
ABO/RH(D): A NEG
ANTIBODY SCREEN: NEGATIVE

## 2016-09-16 LAB — CBC WITH DIFFERENTIAL/PLATELET
BASOS PCT: 0 %
Basophils Absolute: 0 10*3/uL (ref 0.0–0.1)
EOS ABS: 0.3 10*3/uL (ref 0.0–0.7)
Eosinophils Relative: 2 %
HEMATOCRIT: 41.6 % (ref 39.0–52.0)
Hemoglobin: 13.5 g/dL (ref 13.0–17.0)
Lymphocytes Relative: 20 %
Lymphs Abs: 2.2 10*3/uL (ref 0.7–4.0)
MCH: 27.8 pg (ref 26.0–34.0)
MCHC: 32.5 g/dL (ref 30.0–36.0)
MCV: 85.6 fL (ref 78.0–100.0)
Monocytes Absolute: 0.8 10*3/uL (ref 0.1–1.0)
Monocytes Relative: 7 %
NEUTROS ABS: 7.6 10*3/uL (ref 1.7–7.7)
NEUTROS PCT: 71 %
Platelets: 223 10*3/uL (ref 150–400)
RBC: 4.86 MIL/uL (ref 4.22–5.81)
RDW: 12.6 % (ref 11.5–15.5)
WBC: 10.8 10*3/uL — AB (ref 4.0–10.5)

## 2016-09-16 LAB — I-STAT CHEM 8, ED
BUN: 18 mg/dL (ref 6–20)
CHLORIDE: 95 mmol/L — AB (ref 101–111)
CREATININE: 2.1 mg/dL — AB (ref 0.61–1.24)
Calcium, Ion: 1.03 mmol/L — ABNORMAL LOW (ref 1.15–1.40)
Glucose, Bld: 136 mg/dL — ABNORMAL HIGH (ref 65–99)
HEMATOCRIT: 40 % (ref 39.0–52.0)
Hemoglobin: 13.6 g/dL (ref 13.0–17.0)
Potassium: 3.1 mmol/L — ABNORMAL LOW (ref 3.5–5.1)
SODIUM: 137 mmol/L (ref 135–145)
TCO2: 28 mmol/L (ref 0–100)

## 2016-09-16 LAB — MRSA PCR SCREENING: MRSA BY PCR: NEGATIVE

## 2016-09-16 LAB — HEMOGLOBIN AND HEMATOCRIT, BLOOD
HEMATOCRIT: 42.4 % (ref 39.0–52.0)
HEMATOCRIT: 36.2 % — AB (ref 39.0–52.0)
Hemoglobin: 13.4 g/dL (ref 13.0–17.0)
Hemoglobin: 11.5 g/dL — ABNORMAL LOW (ref 13.0–17.0)

## 2016-09-16 LAB — COMPREHENSIVE METABOLIC PANEL
ALBUMIN: 3.5 g/dL (ref 3.5–5.0)
ALT: 11 U/L — ABNORMAL LOW (ref 17–63)
AST: 16 U/L (ref 15–41)
Alkaline Phosphatase: 76 U/L (ref 38–126)
Anion gap: 11 (ref 5–15)
BUN: 16 mg/dL (ref 6–20)
CHLORIDE: 97 mmol/L — AB (ref 101–111)
CO2: 27 mmol/L (ref 22–32)
Calcium: 8.8 mg/dL — ABNORMAL LOW (ref 8.9–10.3)
Creatinine, Ser: 1.99 mg/dL — ABNORMAL HIGH (ref 0.61–1.24)
GFR calc Af Amer: 41 mL/min — ABNORMAL LOW (ref >60)
GFR calc non Af Amer: 36 mL/min — ABNORMAL LOW (ref >60)
GLUCOSE: 135 mg/dL — AB (ref 65–99)
Potassium: 3.1 mmol/L — ABNORMAL LOW (ref 3.5–5.1)
SODIUM: 135 mmol/L (ref 135–145)
Total Bilirubin: 0.7 mg/dL (ref 0.3–1.2)
Total Protein: 6.2 g/dL — ABNORMAL LOW (ref 6.5–8.1)

## 2016-09-16 LAB — CBG MONITORING, ED: Glucose-Capillary: 124 mg/dL — ABNORMAL HIGH (ref 65–99)

## 2016-09-16 LAB — LIPASE, BLOOD: LIPASE: 17 U/L (ref 11–51)

## 2016-09-16 LAB — HIV ANTIBODY (ROUTINE TESTING W REFLEX): HIV Screen 4th Generation wRfx: NONREACTIVE

## 2016-09-16 LAB — ABO/RH: ABO/RH(D): A NEG

## 2016-09-16 MED ORDER — ACETAMINOPHEN 650 MG RE SUPP: 650.0000 mg | Freq: Four times a day (QID) | RECTAL | Status: DC | PRN

## 2016-09-16 MED ORDER — SODIUM CHLORIDE 0.9% FLUSH
3.0000 mL | Freq: Two times a day (BID) | INTRAVENOUS | Status: DC
  Administered 2016-09-16 – 2016-09-19 (×5): 3 mL via INTRAVENOUS

## 2016-09-16 MED ORDER — SODIUM CHLORIDE 0.9 % IV SOLN
8.0000 mg/h | INTRAVENOUS | Status: DC
  Administered 2016-09-16: 8 mg/h via INTRAVENOUS
  Filled 2016-09-16 (×2): qty 80

## 2016-09-16 MED ORDER — SODIUM CHLORIDE 0.9 % IV SOLN
INTRAVENOUS | Status: AC
  Administered 2016-09-16: 07:00:00 via INTRAVENOUS

## 2016-09-16 MED ORDER — METOPROLOL TARTRATE 5 MG/5ML IV SOLN
5.0000 mg | Freq: Four times a day (QID) | INTRAVENOUS | Status: DC | PRN
  Administered 2016-09-16 – 2016-09-17 (×2): 5 mg via INTRAVENOUS
  Filled 2016-09-16: qty 5

## 2016-09-16 MED ORDER — MAGNESIUM OXIDE 400 (241.3 MG) MG PO TABS
800.0000 mg | ORAL_TABLET | Freq: Once | ORAL | Status: AC
  Administered 2016-09-16: 800 mg via ORAL
  Filled 2016-09-16: qty 2

## 2016-09-16 MED ORDER — MOMETASONE FURO-FORMOTEROL FUM 100-5 MCG/ACT IN AERO
2.0000 | INHALATION_SPRAY | Freq: Two times a day (BID) | RESPIRATORY_TRACT | Status: DC
Start: 2016-09-16 — End: 2016-09-19
  Administered 2016-09-16 – 2016-09-19 (×6): 2 via RESPIRATORY_TRACT
  Filled 2016-09-16 (×2): qty 8.8

## 2016-09-16 MED ORDER — RISPERIDONE 1 MG PO TABS
1.0000 mg | ORAL_TABLET | Freq: Every day | ORAL | Status: DC
  Administered 2016-09-16 – 2016-09-18 (×3): 1 mg via ORAL
  Filled 2016-09-16: qty 2
  Filled 2016-09-16 (×2): qty 1
  Filled 2016-09-16: qty 2
  Filled 2016-09-16: qty 1

## 2016-09-16 MED ORDER — SODIUM CHLORIDE 0.9 % IV BOLUS (SEPSIS)
1000.0000 mL | Freq: Once | INTRAVENOUS | Status: AC
  Administered 2016-09-16: 1000 mL via INTRAVENOUS

## 2016-09-16 MED ORDER — HYDRALAZINE HCL 50 MG PO TABS
50.0000 mg | ORAL_TABLET | Freq: Three times a day (TID) | ORAL | Status: DC
  Administered 2016-09-16 – 2016-09-19 (×9): 50 mg via ORAL
  Filled 2016-09-16: qty 1
  Filled 2016-09-16: qty 2
  Filled 2016-09-16 (×7): qty 1

## 2016-09-16 MED ORDER — ONDANSETRON HCL 4 MG PO TABS: 4.0000 mg | ORAL_TABLET | Freq: Four times a day (QID) | ORAL | Status: DC | PRN

## 2016-09-16 MED ORDER — MONTELUKAST SODIUM 10 MG PO TABS
10.0000 mg | ORAL_TABLET | Freq: Every day | ORAL | Status: DC
  Administered 2016-09-16 – 2016-09-18 (×3): 10 mg via ORAL
  Filled 2016-09-16 (×3): qty 1

## 2016-09-16 MED ORDER — PANTOPRAZOLE SODIUM 40 MG IV SOLR: 40.0000 mg | Freq: Two times a day (BID) | INTRAVENOUS | Status: DC

## 2016-09-16 MED ORDER — PANTOPRAZOLE SODIUM 40 MG IV SOLR
40.0000 mg | Freq: Two times a day (BID) | INTRAVENOUS | Status: DC
  Administered 2016-09-16 – 2016-09-17 (×4): 40 mg via INTRAVENOUS
  Filled 2016-09-16 (×4): qty 40

## 2016-09-16 MED ORDER — ONDANSETRON HCL 4 MG/2ML IJ SOLN
4.0000 mg | Freq: Once | INTRAMUSCULAR | Status: AC
  Administered 2016-09-16: 4 mg via INTRAVENOUS
  Filled 2016-09-16: qty 2

## 2016-09-16 MED ORDER — BUPROPION HCL ER (SR) 150 MG PO TB12
150.0000 mg | ORAL_TABLET | ORAL | Status: DC
  Administered 2016-09-16 – 2016-09-19 (×3): 150 mg via ORAL
  Filled 2016-09-16 (×4): qty 1

## 2016-09-16 MED ORDER — ONDANSETRON HCL 4 MG/2ML IJ SOLN: 4.0000 mg | Freq: Four times a day (QID) | INTRAMUSCULAR | Status: DC | PRN

## 2016-09-16 MED ORDER — TAMSULOSIN HCL 0.4 MG PO CAPS
0.4000 mg | ORAL_CAPSULE | Freq: Every day | ORAL | Status: DC
  Administered 2016-09-16 – 2016-09-19 (×3): 0.4 mg via ORAL
  Filled 2016-09-16 (×3): qty 1

## 2016-09-16 MED ORDER — OXYCODONE-ACETAMINOPHEN 5-325 MG PO TABS: 1.0000 | ORAL_TABLET | Freq: Four times a day (QID) | ORAL | Status: DC | PRN

## 2016-09-16 MED ORDER — ACETAMINOPHEN 325 MG PO TABS: 650.0000 mg | ORAL_TABLET | Freq: Four times a day (QID) | ORAL | Status: DC | PRN

## 2016-09-16 MED ORDER — ALBUTEROL SULFATE (2.5 MG/3ML) 0.083% IN NEBU: 2.5000 mg | INHALATION_SOLUTION | Freq: Four times a day (QID) | RESPIRATORY_TRACT | Status: DC | PRN

## 2016-09-16 MED ORDER — POTASSIUM CHLORIDE CRYS ER 20 MEQ PO TBCR
40.0000 meq | EXTENDED_RELEASE_TABLET | Freq: Once | ORAL | Status: AC
  Administered 2016-09-16: 40 meq via ORAL
  Filled 2016-09-16: qty 2

## 2016-09-16 MED ORDER — PANTOPRAZOLE SODIUM 40 MG IV SOLR
40.0000 mg | Freq: Two times a day (BID) | INTRAVENOUS | Status: DC
  Administered 2016-09-16: 40 mg via INTRAVENOUS
  Filled 2016-09-16: qty 40

## 2016-09-16 MED ORDER — METOPROLOL TARTRATE 5 MG/5ML IV SOLN
INTRAVENOUS | Status: AC
  Filled 2016-09-16: qty 5

## 2016-09-16 NOTE — H&P (Signed)
History and Physical    Peter Marsh EVO:[id number] DOB: 10/03/1960 DOA: 09/16/2016  PCP: Marda Stalker, PA-C   Patient coming from: Home  Chief Complaint: Coffee-ground emesis   HPI: Peter Marsh is a 55 y.o. male with medical history significant for schizophrenia, chronic kidney disease stage 3-4, asthma, paroxysmal atrial flutter on Eliquis, hypertension, GERD, remote history of peptic ulcer disease, now presenting to the emergency department for evaluation of abdominal pain, nausea, vomiting, and coffee ground emesis. Patient reports that he been in his usual state of health until 09/13/2016 when he developed the insidious onset of periumbilical abdominal pain and nausea with vomiting. He describes the vomitus is having a coffee-ground appearance. He reports that his stools have been dark, but not particularly blacker greasy, and there has been no hematochezia or bright red blood per rectum. There has been no bright red blood in his vomitus. He denies fevers or chills, denies chest pain or palpitations, and denies dyspnea or cough. Patient called EMS for transport to the hospital and 300 mL of normal saline was administered en route. Patient denies use of alcohol or NSAIDs, endorses continued use of Eliquis.   ED Course: Upon arrival to the ED, patient is found to be afebrile, saturating well on room air, mildly tachycardic, and with stable blood pressure. Chemistry panels notable for a potassium of 3.1 and serum creatinine 1.99 which appears consistent with his baseline. CBC is notable for a slight leukocytosis to 10,800 but normal H&H. Patient was given a liter of normal saline and 40 mEq oral potassium. He is also given a dose of Zofran and 40 mg IV Protonix. Tachycardia resolved with the IV fluid and he remained hemodynamically stable. There had been 2 episodes of unwitnessed vomiting in the emergency department that the patient reported to have coffee-ground appearance. He will be  admitted to stepdown unit for ongoing evaluation and management of abdominal pain with nausea and coffee-ground emesis concerning for upper GI bleeding.  Review of Systems:  All other systems reviewed and apart from HPI, are negative.  Past Medical History:  Diagnosis Date  . Asthma    uses inhalers   . Bilateral carotid bruits   . Chronic kidney disease    bladder interstial cystitis   . Chronic kidney disease (CKD), stage IV (severe) ([name])    follow[facility] by Dr. Jo[facility]ms at [facility]  . Depression   . GERD (gastroesophageal reflux disease)   . History of stomach ulcers 2001  . Hypertension   . LBBB (left bundle branch block)   . Lower extremity edema   . Mild intermittent asthma without complication   . Mixed hyperlipidemia   . Paroxysmal atrial flutter ([place])   . PONV (postoperative nausea and vomiting)   . Schizophrenia [facility])     Past Surgical History:  Procedure Laterality Date  . APPENDECTOMY    . LUMBAR LAMINECTOMY/DECOMPRESSION MICRODISCECTOMY  04/19/2011   Procedure: LUMBAR LAMINECTOMY/DECOMPRESSION MICRODISCECTOMY;  Surgeon: Tobi Bastos;  Location: [facility] ORS;  Service: Orthopedics;  Laterality: Left;  Hemi LAminectomy/Microdiscectomy Lumbar four  - Lumbar five  on the Left (X-Ray)  . TONSILLECTOMY       reports that he quit smoking about 8 years ago. He has a 30.00 pack-year smoking history. He has never used smokeless tobacco. He reports that he does not drink alcohol or use drugs.  Allergies  Allergen Reactions  . Methylpyrrolidone Hives    froze the intestine  . Niacin Other (See Comments)  Flushing, itching, tingling   . Ciprofloxacin Other (See Comments)    Felt flushed  . Oxybutynin Chloride [Oxybutynin Chloride Er] Other (See Comments)    froze the intestine  . Vesicare [Solifenacin Succinate] Other (See Comments)    Froze the intestine     Family History  Problem Relation Age of Onset  . High blood pressure Mother   . Alzheimer's disease  Father      Prior to Admission medications   Medication Sig Start Date End Date Taking? Authorizing Provider  albuterol (PROVENTIL HFA;VENTOLIN HFA) 108 (90 BASE) MCG/ACT inhaler Inhale 2 puffs into the lungs every 6 (six) hours as needed. Wheezing     [provider]  budesonide-formoterol (SYMBICORT) 80-4.5 MCG/ACT inhaler Inhale 2 puffs into the lungs 2 (two) times daily. Patient taking differently: Inhale 2 puffs into the lungs 2 (two) times daily as needed (SOB, wheezing).  02/09/16   Tanda Rockers, MD  buPROPion (WELLBUTRIN SR) 150 MG 12 hr tablet Take 150 mg by mouth every morning.     [provider]  ELIQUIS 5 MG TABS tablet Take 5 mg by mouth 2 (two) times daily. 06/01/16   [provider]  hydrALAZINE (APRESOLINE) 50 MG tablet Take 50 mg by mouth 3 (three) times daily. 04/11/16   [provider]  INVEGA SUSTENNA 156 MG/ML SUSP injection Inject 156 mg as directed every 30 (thirty) days. 05/20/16   [provider]  montelukast (SINGULAIR) 10 MG tablet Take 10 mg by mouth at bedtime. 01/17/16   [provider]  ondansetron (ZOFRAN ODT) 4 MG disintegrating tablet Take 1 tablet (4 mg total) by mouth every 8 (eight) hours as needed for nausea or vomiting. 06/17/16   Antonietta Breach, PA-C  oxyCODONE-acetaminophen (PERCOCET/ROXICET) 5-325 MG tablet Take 1-2 tablets by mouth every 6 (six) hours as needed for severe pain. 06/17/16   Antonietta Breach, PA-C  ranitidine (ZANTAC) 150 MG tablet Take 150 mg by mouth 2 (two) times daily. 01/11/16   [provider]  risperiDONE (RISPERDAL) 1 MG tablet Take 1 mg by mouth at bedtime. 01/06/16   [provider]  tamsulosin (FLOMAX) 0.4 MG CAPS capsule Take 1 capsule (0.4 mg total) by mouth daily. 06/17/16   Antonietta Breach, PA-C    Physical Exam: Vitals:   09/16/16 0445 09/16/16 0500 09/16/16 0515 09/16/16 0530  BP: 137/90 (!) 141/90 (!) 134/91 (!) 146/91  Pulse: 95 95 87 96  Resp: (!) 22 18 18 15    Temp:      TempSrc:      SpO2: 93% 93% 92%       Constitutional: No respiratory distress, appears uncomfortable, pale.  Eyes: PERTLA, lids and conjunctivae normal ENMT: Mucous membranes are moist. Posterior pharynx clear of any exudate or lesions.   Neck: normal, supple, no masses, no thyromegaly Respiratory: clear to auscultation bilaterally, no wheezing, no crackles. Normal respiratory effort.  Cardiovascular: Rate ~100 and regular. No extremity edema. No significant JVD. Abdomen: No distension, soft, tender periumbilically and in epigastrium, no masses palpated, no rebound pain or guarding. Bowel sounds active.  Musculoskeletal: no clubbing / cyanosis. No joint deformity upper and lower extremities.  Skin: no significant rashes, lesions, ulcers. Warm, dry, well-perfused. Pale.  Neurologic: CN 2-12 grossly intact. Sensation intact, DTR normal. Strength 5/5 in all 4 limbs.  Psychiatric: Alert and oriented x 3. Blunted affect. Pleasant and cooperative.    Labs on Admission: I have personally reviewed following labs and imaging studies  CBC:  Recent  Labs Lab 09/16/16 0339 09/16/16 0353  WBC 10.8*  --   NEUTROABS 7.6  --   HGB 13.5 13.6  HCT 41.6 40.0  MCV 85.6  --   PLT 223  --    Basic Metabolic Panel:  Recent Labs Lab 09/16/16 0339 09/16/16 0353  NA 135 137  K 3.1* 3.1*  CL 97* 95*  CO2 27  --   GLUCOSE 135* 136*  BUN 16 18  CREATININE 1.99* 2.10*  CALCIUM 8.8*  --    GFR: CrCl cannot be calculated (Unknown ideal weight.). Liver Function Tests:  Recent Labs Lab 09/16/16 0339  AST 16  ALT 11*  ALKPHOS 76  BILITOT 0.7  PROT 6.2*  ALBUMIN 3.5    Recent Labs Lab 09/16/16 0339  LIPASE 17   No results for input(s): AMMONIA in the last 168 hours. Coagulation Profile: No results for input(s): INR, PROTIME in the last 168 hours. Cardiac Enzymes: No results for input(s): CKTOTAL, CKMB, CKMBINDEX, TROPONINI in the last 168 hours. BNP (last 3  results) No results for input(s): PROBNP in the last 8760 hours. HbA1C: No results for input(s): HGBA1C in the last 72 hours. CBG: No results for input(s): GLUCAP in the last 168 hours. Lipid Profile: No results for input(s): CHOL, HDL, LDLCALC, TRIG, CHOLHDL, LDLDIRECT in the last 72 hours. Thyroid Function Tests: No results for input(s): TSH, T4TOTAL, FREET4, T3FREE, THYROIDAB in the last 72 hours. Anemia Panel: No results for input(s): VITAMINB12, FOLATE, FERRITIN, TIBC, IRON, RETICCTPCT in the last 72 hours. Urine analysis:    Component Value Date/Time   COLORURINE RED (A) 06/16/2016 2130   APPEARANCEUR TURBID (A) 06/16/2016 2130   LABSPEC  06/16/2016 2130    TEST NOT REPORTED DUE TO COLOR INTERFERENCE OF URINE PIGMENT   PHURINE  06/16/2016 2130    TEST NOT REPORTED DUE TO COLOR INTERFERENCE OF URINE PIGMENT   GLUCOSEU (A) 06/16/2016 2130    TEST NOT REPORTED DUE TO COLOR INTERFERENCE OF URINE PIGMENT   HGBUR (A) 06/16/2016 2130    TEST NOT REPORTED DUE TO COLOR INTERFERENCE OF URINE PIGMENT   BILIRUBINUR (A) 06/16/2016 2130    TEST NOT REPORTED DUE TO COLOR INTERFERENCE OF URINE PIGMENT   KETONESUR (A) 06/16/2016 2130    TEST NOT REPORTED DUE TO COLOR INTERFERENCE OF URINE PIGMENT   PROTEINUR (A) 06/16/2016 2130    TEST NOT REPORTED DUE TO COLOR INTERFERENCE OF URINE PIGMENT   UROBILINOGEN 0.2 04/17/2011 1030   NITRITE (A) 06/16/2016 2130    TEST NOT REPORTED DUE TO COLOR INTERFERENCE OF URINE PIGMENT   LEUKOCYTESUR (A) 06/16/2016 2130    TEST NOT REPORTED DUE TO COLOR INTERFERENCE OF URINE PIGMENT   Sepsis Labs: @LABRCNTIP (procalcitonin:4,lacticidven:4) )No results found for this or any previous visit (from the past 240 hour(s)).   Radiological Exams on Admission: No results found.  EKG: Not performed; sinus rhythm with rate 105 noted on cardiac monitor.  Assessment/Plan  1. Upper GI bleed - Pt presents with 2 days of abdominal pain, nausea, and coffee-ground  emesis; reports a remote hx of PUD; takes H2-blocker daily; denies EtOH or NSAID use; is anticoagulated with Eliquis  - Hgb is 13.5 on admission; he was mildly tachycardic prior to fluid bolus; never hypotensive  - Type and screen was performed and 40 mg IV Protonix given in ED  - Plan to monitor in SDU initially, start Protonix infusion, hold Eliquis, follow serial H&H    2. Paroxysmal atrial flutter  - In  a sinus rhythm on presentation  - CHADS-VASc at least 1 (HTN)  - He is anticoagulated with Eliquis; this is held on admission  - He is monitored on telemetry  3. CKD stage III  - SCr is 1.99 on admission, consistent with baseline  - Avoid nephrotoxins where possible, renally-dose medications   4. Asthma  - Mild intermittent, stable  - Continue prn albuterol   5. Schizophrenia  - Appears to be stable  - Continue Risperdal, Wellbutrin    6. Hypertension - BP at goal  - Continue hydralazine as tolerated   7. Hypokalemia  - Serum potassium is 3.1 on admission, likely secondary to GI-losses  - He was treated with oral magnesium and 40 mEq PO potassium in ED  - Continue telemetry monitoring and repeat chem panel    DVT prophylaxis: SCD's Code Status: Full  Family Communication: Wife updated at bedside Disposition Plan: Admit to SDU Consults called: None Admission status: Inpatient    Vianne Bulls, MD [facility] Pager [phone]  If 7PM-7AM, please contact night-coverage [email or web address] Password [facility]  09/16/2016, 6:05 AM

## 2016-09-16 NOTE — ED Triage Notes (Signed)
Per EMS pt has been vomiting coffee-ground emesis since yesterday.  Upon arrival BP was 162/104 with pt lying down. Pt sat up and became diaphoretic and pulse was barely palpable.  Pt does have a significant GI history.  300 NS given en route.

## 2016-09-16 NOTE — ED Notes (Signed)
Diet ordered 

## 2016-09-16 NOTE — ED Provider Notes (Signed)
[facility] DEPT Provider Note   CSN: [id number] Arrival date & time: 09/16/16  0335  By signing my name below, I, [name], attest that this documentation has been prepared under the direction and in the presence of Deno Etienne, DO. Electronically Signed: [name], Scribe. 09/16/2016. 3:39 AM.  History   Chief Complaint Chief Complaint  Patient presents with  . GI Bleeding   The history is provided by the patient and the EMS personnel. No language interpreter was used.   HPI Comments: Peter Marsh is a 56 y.o. male who presents to the Emergency Department complaining of intermittent vomiting that started yesterday. He came to the ED via EMS. He states his emesis contained coffee-ground looking material. No modifying factors. He reports associated melana, generalized weakness, dizziness, and RLQ abdominal pain. Denies any other complaints at this time.    PCP: Marda Stalker, PA-C  Past Medical History:  Diagnosis Date  . Asthma    uses inhalers   . Bilateral carotid bruits   . Chronic kidney disease    bladder interstial cystitis   . Chronic kidney disease (CKD), stage IV (severe) ([name])    [facility] Dr[facility] at [facility]  . Depression   . GERD (gastroesophageal reflux disease)   . History of stomach ulcers 2001  . Hypertension   . LBBB (left bundle branch block)   . Lower extremity edema   . Mild intermittent asthma without complication   . Mixed hyperlipidemia   . Paroxysmal atrial flutter ([place])   . PONV (postoperative nausea and vomiting)   . Schizophrenia [facility])     Patient Active Problem List   Diagnosis Date Noted  . Cough variant asthma 02/09/2016  . Dyspnea 02/09/2016  . BRBPR (bright red blood per rectum) 06/18/2013  . Herniated lumbar intervertebral disc 04/20/2011    Past Surgical History:  Procedure Laterality Date  . APPENDECTOMY    . LUMBAR LAMINECTOMY/DECOMPRESSION MICRODISCECTOMY  04/19/2011   Procedure: LUMBAR  LAMINECTOMY/DECOMPRESSION MICRODISCECTOMY;  Surgeon: Tobi Bastos;  Location: [facility] ORS;  Service: Orthopedics;  Laterality: Left;  Hemi LAminectomy/Microdiscectomy Lumbar four  - Lumbar five  on the Left (X-Ray)  . TONSILLECTOMY         Home Medications    Prior to Admission medications   Medication Sig Start Date End Date Taking? Authorizing Provider  albuterol (PROVENTIL HFA;VENTOLIN HFA) 108 (90 BASE) MCG/ACT inhaler Inhale 2 puffs into the lungs every 6 (six) hours as needed. Wheezing     [provider]  budesonide-formoterol (SYMBICORT) 80-4.5 MCG/ACT inhaler Inhale 2 puffs into the lungs 2 (two) times daily. Patient taking differently: Inhale 2 puffs into the lungs 2 (two) times daily as needed (SOB, wheezing).  02/09/16   Tanda Rockers, MD  buPROPion (WELLBUTRIN SR) 150 MG 12 hr tablet Take 150 mg by mouth every morning.     [provider]  ELIQUIS 5 MG TABS tablet Take 5 mg by mouth 2 (two) times daily. 06/01/16   [provider]  hydrALAZINE (APRESOLINE) 50 MG tablet Take 50 mg by mouth 3 (three) times daily. 04/11/16   [provider]  INVEGA SUSTENNA 156 MG/ML SUSP injection Inject 156 mg as directed every 30 (thirty) days. 05/20/16   [provider]  montelukast (SINGULAIR) 10 MG tablet Take 10 mg by mouth at bedtime. 01/17/16   [provider]  ondansetron (ZOFRAN ODT) 4 MG disintegrating tablet Take 1 tablet (4 mg total) by mouth every 8 (eight) hours as  needed for nausea or vomiting. 06/17/16   Antonietta Breach, PA-C  oxyCODONE-acetaminophen (PERCOCET/ROXICET) 5-325 MG tablet Take 1-2 tablets by mouth every 6 (six) hours as needed for severe pain. 06/17/16   Antonietta Breach, PA-C  ranitidine (ZANTAC) 150 MG tablet Take 150 mg by mouth 2 (two) times daily. 01/11/16   [provider]  risperiDONE (RISPERDAL) 1 MG tablet Take 1 mg by mouth at bedtime. 01/06/16   [provider]  tamsulosin (FLOMAX) 0.4 MG CAPS capsule  Take 1 capsule (0.4 mg total) by mouth daily. 06/17/16   Antonietta Breach, PA-C    Family History Family History  Problem Relation Age of Onset  . High blood pressure Mother   . Alzheimer's disease Father     Social History Social History  Substance Use Topics  . Smoking status: Former Smoker    Packs/day: 1.00    Years: 30.00    Quit date: 05/08/2008  . Smokeless tobacco: Never Used  . Alcohol use No     Allergies   Methylpyrrolidone; Niacin; Ciprofloxacin; Oxybutynin chloride [oxybutynin chloride er]; and Vesicare [solifenacin succinate]   Review of Systems Review of Systems  Constitutional: Negative for chills and fever.  HENT: Negative for congestion and facial swelling.   Eyes: Negative for discharge and visual disturbance.  Respiratory: Negative for shortness of breath.   Cardiovascular: Negative for chest pain and palpitations.  Gastrointestinal: Positive for abdominal pain (RLQ) and blood in stool. Negative for diarrhea and vomiting.  Musculoskeletal: Negative for arthralgias and myalgias.  Skin: Negative for color change and rash.  Neurological: Positive for dizziness and weakness (Generalized). Negative for tremors, syncope and headaches.  Psychiatric/Behavioral: Negative for confusion and dysphoric mood.     Physical Exam Updated Vital Signs BP (!) 128/95   Pulse (!) 101   Temp 98.8 F (37.1 C) (Oral)   Resp (!) 23   SpO2 97%   Physical Exam  Constitutional: He is oriented to person, place, and time. He appears well-developed and well-nourished.  Obese.   HENT:  Head: Normocephalic and atraumatic.  Eyes: EOM are normal. Pupils are equal, round, and reactive to light.  Neck: Normal range of motion. Neck supple. No JVD present.  Cardiovascular: Normal rate and regular rhythm.  Exam reveals no gallop and no friction rub.   No murmur heard. Pulmonary/Chest: No respiratory distress. He has no wheezes.  Abdominal: He exhibits no distension and no mass. There  is no tenderness. There is no rebound and no guarding.  No focal abdominal tenderness.   Musculoskeletal: Normal range of motion.  Neurological: He is alert and oriented to person, place, and time.  Skin: No rash noted. No pallor.  Psychiatric: He has a normal mood and affect. His behavior is normal.  Nursing note and vitals reviewed.    ED Treatments / Results  DIAGNOSTIC STUDIES: Oxygen Saturation is 97% on RA, normal by my interpretation.    COORDINATION OF CARE: 3:43 AM- Pt advised of plan for treatment and pt agrees.  Labs (all labs ordered are listed, but only abnormal results are displayed) Labs Reviewed  CBC WITH DIFFERENTIAL/PLATELET - Abnormal; Notable for the following:       Result Value   WBC 10.8 (*)    All other components within normal limits  COMPREHENSIVE METABOLIC PANEL - Abnormal; Notable for the following:    Potassium 3.1 (*)    Chloride 97 (*)    Glucose, Bld 135 (*)    Creatinine, Ser 1.99 (*)  Calcium 8.8 (*)    Total Protein 6.2 (*)    ALT 11 (*)    GFR calc non Af Amer 36 (*)    GFR calc Af Amer 41 (*)    All other components within normal limits  I-STAT CHEM 8, ED - Abnormal; Notable for the following:    Potassium 3.1 (*)    Chloride 95 (*)    Creatinine, Ser 2.10 (*)    Glucose, Bld 136 (*)    Calcium, Ion 1.03 (*)    All other components within normal limits  LIPASE, BLOOD  OCCULT BLOOD GASTRIC / DUODENUM (SPECIMEN CUP)  TYPE AND SCREEN  ABO/RH    EKG  EKG Interpretation None       Radiology No results found.  Procedures Procedures (including critical care time)  Medications Ordered in ED Medications  ondansetron (ZOFRAN) injection 4 mg (not administered)  pantoprazole (PROTONIX) injection 40 mg (not administered)  sodium chloride 0.9 % bolus 1,000 mL (0 mLs Intravenous Stopped 09/16/16 0449)  ondansetron (ZOFRAN) injection 4 mg (4 mg Intravenous Given 09/16/16 0346)  potassium chloride SA (K-DUR,KLOR-CON) CR tablet 40  mEq (40 mEq Oral Given 09/16/16 0542)  magnesium oxide (MAG-OX) tablet 800 mg (800 mg Oral Given 09/16/16 0541)     Initial Impression / Assessment and Plan / ED Course  I have reviewed the triage vital signs and the nursing notes.  Pertinent labs & imaging results that were available during my care of the patient were reviewed by me and considered in my medical decision making (see chart for details).     56 yo M With a chief complaint of multiple episodes of coffee-ground emesis. Starting this afternoon. Patient has had at least 10 of them. Had a couple more while in the ED.  patient was recently diagnosed with hepatitis C as well as chronic pancreatitis. No noted varices were noted on his last CAT scan. As the patient continues to have symptoms we'll place in the hospital.  The patients results and plan were reviewed and discussed.   Any x-rays performed were independently reviewed by myself.   Differential diagnosis were considered with the presenting HPI.  Medications  ondansetron (ZOFRAN) injection 4 mg (not administered)  pantoprazole (PROTONIX) injection 40 mg (not administered)  sodium chloride 0.9 % bolus 1,000 mL (0 mLs Intravenous Stopped 09/16/16 0449)  ondansetron (ZOFRAN) injection 4 mg (4 mg Intravenous Given 09/16/16 0346)  potassium chloride SA (K-DUR,KLOR-CON) CR tablet 40 mEq (40 mEq Oral Given 09/16/16 0542)  magnesium oxide (MAG-OX) tablet 800 mg (800 mg Oral Given 09/16/16 0541)    Vitals:   09/16/16 0445 09/16/16 0500 09/16/16 0515 09/16/16 0530  BP: 137/90 (!) 141/90 (!) 134/91 (!) 146/91  Pulse: 95 95 87 96  Resp: (!) 22 18 18 15   Temp:      TempSrc:      SpO2: 93% 93% 92%     Final diagnoses:  Upper GI bleed    Admission/ observation were discussed with the admitting physician, patient and/or family and they are comfortable with the plan.    Final Clinical Impressions(s) / ED Diagnoses   Final diagnoses:  Upper GI bleed    New Prescriptions New  Prescriptions   No medications on file   I personally performed the services described in this documentation, which was scribed in my presence. The recorded information has been reviewed and is accurate.      Deno Etienne, DO 09/16/16 [phone]

## 2016-09-16 NOTE — ED Notes (Signed)
Placed patient back on the monitor after he returned from the bathroom.

## 2016-09-16 NOTE — Progress Notes (Signed)
Patient ID: Peter Marsh, male   DOB: 09/05/1960, 56 y.o.   MRN: [id number]  PROGRESS NOTE    Peter Marsh  OYD:[id number] DOB: [birth year] DOA: 09/16/2016 PCP: Marda Stalker, PA-C   Brief Narrative:  56 y.o. male with medical history significant for schizophrenia, chronic kidney disease stage 3-4, asthma, paroxysmal atrial flutter on Eliquis, hypertension, GERD, remote history of peptic ulcer disease, presented to the emergency department for evaluation of abdominal pain, nausea, vomiting, and coffee ground emesis. He was started on intravenous Protonix.  Assessment & Plan:   Principal Problem:   Coffee ground emesis Active Problems:   Cough variant asthma   CKD (chronic kidney disease), stage III   Schizophrenia ([name])   Paroxysmal atrial fibrillation ([name])   Upper GI bleed  1. Probable Upper GI bleed: - Spoke to Dr. Alessandra Bevels from GI who will see the patient in consultation - Continue Protonix. Monitor H&H. - hold Eliquis   2. Paroxysmal atrial flutter  - Monitor heart rate -Hold Eliquis for now  3. CKD stage III  - Stable - Monitor creatinine - Avoid nephrotoxins where possible, renally-dose medications   4. Asthma  - Mild intermittent, stable  - Continue prn albuterol   5. Schizophrenia  - Appears to be stable  - Continue Risperdal, Wellbutrin    6. Hypertension - BP at goal  - Continue hydralazine as tolerated   7. Hypokalemia  - Repeat a.m. labs    DVT prophylaxis: SCDs Code Status:  Full Family Communication: None at bedside Disposition Plan: Home in 2-3 days  Consultants: GI  Procedures: None  Antimicrobials: None Subjective: Patient seen and examined at bedside. He denies any current nausea, vomiting.  Objective: Vitals:   09/16/16 1400 09/16/16 1412 09/16/16 1415 09/16/16 1500  BP: (!) 156/91  (!) 148/99 (!) 144/97  Pulse:  (!) 108 (!) 104 (!) 119  Resp:  20 16 19   Temp:    98.2 F (36.8 C)  TempSrc:    Oral  SpO2:   97% 97% 96%  Weight:    101.1 kg (222 lb 14.4 oz)  Height:    6' (1.829 m)    Intake/Output Summary (Last 24 hours) at 09/16/16 1528 Last data filed at 09/16/16 1500  Gross per 24 hour  Intake           169.17 ml  Output                0 ml  Net           169.17 ml   Filed Weights   09/16/16 1500  Weight: 101.1 kg (222 lb 14.4 oz)    Examination:  General exam:Alert, awake. Appears comfortable  Respiratory system: Bilateral decreased breath sound at bases  Cardiovascular system: S1-S2 positive, tachycardic  Gastrointestinal system: Soft, slightly distended, mild epigastric tenderness, bowel sounds positive  Extremities: No cyanosis, clubbing, edema     Data Reviewed: I have personally reviewed following labs and imaging studies  CBC:  Recent Labs Lab 09/16/16 0339 09/16/16 0353 09/16/16 1147  WBC 10.8*  --   --   NEUTROABS 7.6  --   --   HGB 13.5 13.6 13.4  HCT 41.6 40.0 42.4  MCV 85.6  --   --   PLT 223  --   --    Basic Metabolic Panel:  Recent Labs Lab 09/16/16 0339 09/16/16 0353  NA 135 137  K 3.1* 3.1*  CL 97* 95*  CO2 27  --  GLUCOSE 135* 136*  BUN 16 18  CREATININE 1.99* 2.10*  CALCIUM 8.8*  --    GFR: Estimated Creatinine Clearance: 47.8 mL/min (A) (by C-G formula based on SCr of 2.1 mg/dL (H)). Liver Function Tests:  Recent Labs Lab 09/16/16 0339  AST 16  ALT 11*  ALKPHOS 76  BILITOT 0.7  PROT 6.2*  ALBUMIN 3.5    Recent Labs Lab 09/16/16 0339  LIPASE 17   No results for input(s): AMMONIA in the last 168 hours. Coagulation Profile: No results for input(s): INR, PROTIME in the last 168 hours. Cardiac Enzymes: No results for input(s): CKTOTAL, CKMB, CKMBINDEX, TROPONINI in the last 168 hours. BNP (last 3 results) No results for input(s): PROBNP in the last 8760 hours. HbA1C: No results for input(s): HGBA1C in the last 72 hours. CBG:  Recent Labs Lab 09/16/16 0830  GLUCAP 124*   Lipid Profile: No results for  input(s): CHOL, HDL, LDLCALC, TRIG, CHOLHDL, LDLDIRECT in the last 72 hours. Thyroid Function Tests: No results for input(s): TSH, T4TOTAL, FREET4, T3FREE, THYROIDAB in the last 72 hours. Anemia Panel: No results for input(s): VITAMINB12, FOLATE, FERRITIN, TIBC, IRON, RETICCTPCT in the last 72 hours. Sepsis Labs: No results for input(s): PROCALCITON, LATICACIDVEN in the last 168 hours.  No results found for this or any previous visit (from the past 240 hour(s)).       Radiology Studies: US Abdomen Limited Ruq  Result Date: 09/16/2016 CLINICAL DATA:  Vomiting and nausea. EXAM: US ABDOMEN LIMITED - RIGHT UPPER QUADRANT COMPARISON:  CT scan June 17, 2016 FINDINGS: Gallbladder: No gallstones or wall thickening visualized. No sonographic Murphy sign noted by sonographer. Common bile duct: Diameter: 3.2 mm. The common bile duct was difficult to visualize well however. Liver: The left hepatic lobe was not seen due to shadowing bowel gas. Other: The patient's known prominent right renal pelvis is identified, measuring up to 6.2 cm. IMPRESSION: 1. Limited study. No abnormality identified in the gallbladder, common bile duct, or liver. 2. Large cystic mass in the right kidney appears to correlate with the dilated right renal pelvis seen in February of 2018. Electronically Signed   By: Dorise Bullion III M.D   On: 09/16/2016 13:54        Scheduled Meds: . buPROPion  150 mg Oral BH-q7a  . hydrALAZINE  50 mg Oral TID  . mometasone-formoterol  2 puff Inhalation BID  . montelukast  10 mg Oral QHS  . pantoprazole (PROTONIX) IV  40 mg Intravenous Q12H  . risperiDONE  1 mg Oral QHS  . sodium chloride flush  3 mL Intravenous Q12H  . tamsulosin  0.4 mg Oral Daily   Continuous Infusions: . sodium chloride 100 mL/hr at 09/16/16 0729     LOS: 0 days        Aline August, MD [facility] Pager [phone]  If 7PM-7AM, please contact night-coverage [email or web address] Password  [facility] 09/16/2016, 3:28 PM

## 2016-09-16 NOTE — ED Notes (Signed)
Hospitalist at bedside at this time 

## 2016-09-16 NOTE — Consult Note (Signed)
Referring Provider: TH/  Primary Care Physician:  Marda Stalker, PA-C Primary Gastroenterologist:  Dr. Alessandra Bevels  Reason for Consultation:  GI bleed, nausea and vomiting  HPI: Peter Marsh is a 56 y.o. male came into the hospital for further evaluation of nausea and vomiting. Patient mentioned some coffee-ground material in the vomiting. GI is consulted for further evaluation. Patient was recently seen by me in the office on 08/09/2016 for evaluation of chronic pancreatitis along with nausea and vomiting... Patient with history of chronic pancreatitis dated back to 2016. Peter Marsh Patient was also recently diagnosed with atrial flutter and currently on Eliquis.  Patient was prescribed Zenpep for chronic pancreatitis. Patient was also offered PPI for acid reflux which patient declined secondary to concern for chronic kidney disease. According to patient he had some improvement with Zenpep but continued to have nausea and vomiting. Patient with on and off nausea since last 2 months but according to him started having worsening symptoms around 2 days ago. Denied any black stool or bright red blood per rectum. Complaining of generalized abdominal discomfort but no specific pain. No bowel movement today. Denied any fever or chills.    Had EGD and colonoscopy around 5 years ago by Dr. Carol Ada which was unremarkable per patient. No reports available to review. Had borderline gastric empty study in 2010.   Past Medical History:  Diagnosis Date  . Asthma    uses inhalers   . Bilateral carotid bruits   . Chronic kidney disease    bladder interstial cystitis   . Chronic kidney disease (CKD), stage IV (severe) ([name])    follow[facility] by [facility]. Joelyn Oms at [facility]  . Depression   . GERD (gastroesophageal reflux disease)   . History of stomach ulcers 2001  . Hypertension   . LBBB (left bundle branch block)   . Lower extremity edema   . Mild intermittent asthma without complication   . Mixed  hyperlipidemia   . Paroxysmal atrial flutter ([place])   . PONV (postoperative nausea and vomiting)   . Schizophrenia [facility])     Past Surgical History:  Procedure Laterality Date  . APPENDECTOMY    . LUMBAR LAMINECTOMY/DECOMPRESSION MICRODISCECTOMY  04/19/2011   Procedure: LUMBAR LAMINECTOMY/DECOMPRESSION MICRODISCECTOMY;  Surgeon: Tobi Bastos;  Location: [facility];  Service: Orthopedics;  Laterality: Left;  Hemi LAminectomy/Microdiscectomy Lumbar four  - Lumbar five  on the Left (X-Ray)  . TONSILLECTOMY      Prior to Admission medications   Medication Sig Start Date End Date Taking? Authorizing Provider  albuterol (PROVENTIL HFA;VENTOLIN HFA) 108 (90 BASE) MCG/ACT inhaler Inhale 2 puffs into the lungs every 6 (six) hours as needed for wheezing or shortness of breath.    Yes [provider]  budesonide-formoterol (SYMBICORT) 80-4.5 MCG/ACT inhaler Inhale 2 puffs into the lungs 2 (two) times daily. Patient taking differently: Inhale 2 puffs into the lungs 2 (two) times daily as needed (SOB, wheezing).  02/09/16  Yes Tanda Rockers, MD  buPROPion [facility] SR) 150 MG 12 hr tablet Take 150 mg by mouth every morning.    Yes [provider]  cholecalciferol (VITAMIN D) 1000 units tablet Take 1,000 Units by mouth daily.   Yes [provider]  ELIQUIS 5 MG TABS tablet Take 5 mg by mouth 2 (two) times daily. 06/01/16  Yes [provider]  hydrALAZINE (APRESOLINE) 50 MG tablet Take 50 mg by mouth 3 (three) times daily. 04/11/16  Yes [provider]  INVEGA SUSTENNA 156 MG/ML SUSP  injection Inject 156 mg as directed every 30 (thirty) days. 05/20/16  Yes [provider]  montelukast (SINGULAIR) 10 MG tablet Take 10 mg by mouth at bedtime. 01/17/16  Yes [provider]  ondansetron (ZOFRAN ODT) 4 MG disintegrating tablet Take 1 tablet (4 mg total) by mouth every 8 (eight) hours as needed for nausea or vomiting. 06/17/16  Yes Antonietta Breach, PA-C   oxyCODONE-acetaminophen (PERCOCET/ROXICET) 5-325 MG tablet Take 1-2 tablets by mouth every 6 (six) hours as needed for severe pain. 06/17/16  Yes Antonietta Breach, PA-C  ranitidine (ZANTAC) 150 MG tablet Take 150 mg by mouth 2 (two) times daily. 01/11/16  Yes [provider]  risperiDONE (RISPERDAL) 1 MG tablet Take 1 mg by mouth at bedtime. 01/06/16  Yes [provider]  tamsulosin (FLOMAX) 0.4 MG CAPS capsule Take 1 capsule (0.4 mg total) by mouth daily. 06/17/16  Yes Antonietta Breach, PA-C    Scheduled Meds: . buPROPion  150 mg Oral BH-q7a  . hydrALAZINE  50 mg Oral TID  . mometasone-formoterol  2 puff Inhalation BID  . montelukast  10 mg Oral QHS  . [START ON 09/19/2016] pantoprazole  40 mg Intravenous Q12H  . risperiDONE  1 mg Oral QHS  . sodium chloride flush  3 mL Intravenous Q12H  . tamsulosin  0.4 mg Oral Daily   Continuous Infusions: . sodium chloride 100 mL/hr at 09/16/16 0729  . pantoprozole (PROTONIX) infusion 8 mg/hr (09/16/16 0729)   PRN Meds:.acetaminophen **OR** acetaminophen, albuterol, ondansetron **OR** ondansetron (ZOFRAN) IV, oxyCODONE-acetaminophen  Allergies as of 09/16/2016 - Review Complete 09/16/2016  Allergen Reaction Noted  . Methylpyrrolidone Hives 07/21/2014  . Niacin Other (See Comments) 08/30/2012  . Ciprofloxacin Other (See Comments) 10/26/2010  . Oxybutynin chloride [oxybutynin chloride er] Other (See Comments) 04/13/2011  . Vesicare [solifenacin succinate] Other (See Comments) 04/13/2011    Family History  Problem Relation Age of Onset  . High blood pressure Mother   . Alzheimer's disease Father     Social History   Social History  . Marital status: Married    Spouse name: N/A  . Number of children: N/A  . Years of education: N/A   Occupational History  . Not on file.   Social History Main Topics  . Smoking status: Former Smoker    Packs/day: 1.00    Years: 30.00    Quit date: 05/08/2008  . Smokeless tobacco: Never Used  .  Alcohol use No  . Drug use: No  . Sexual activity: No   Other Topics Concern  . Not on file   Social History Narrative  . No narrative on file    Review of Systems:  Review of Systems  Constitutional: Positive for malaise/fatigue. Negative for chills and fever.  HENT: Negative for congestion, ear discharge, ear pain and nosebleeds.   Eyes: Negative for blurred vision and double vision.  Respiratory: Negative for cough, sputum production and shortness of breath.   Cardiovascular: Negative for chest pain, palpitations and orthopnea.  Gastrointestinal: Positive for abdominal pain, heartburn, nausea and vomiting. Negative for blood in stool.  Genitourinary: Negative for dysuria and urgency.  Musculoskeletal: Positive for joint pain and myalgias.  Skin: Negative for itching and rash.  Neurological: Negative for seizures and loss of consciousness.  Endo/Heme/Allergies: Does not bruise/bleed easily.  Psychiatric/Behavioral: Negative for hallucinations and suicidal ideas.    Physical Exam: Vital signs: Vitals:   09/16/16 1130 09/16/16 1145  BP:    Pulse: (!) 104 (!) 107  Resp: Peter Marsh)  22 (!) 26  Temp:       General:   Alert,  Well-developed, well-nourished, pleasant and cooperative in NAD HEENT : Extraocular movement intact. Normocephalic/atraumatic. No oral lesions. No scleral icterus. Lungs:  Clear throughout to auscultation.   No wheezes, crackles, or rhonchi. No acute distress. Heart:  Regular rate and rhythm; no murmurs, clicks, rubs,  or gallops. Abdomen: Mildly distended without any tenderness. No peritoneal signs. Bowel sounds present. Not able to palpate hepatosplenomegaly. Psych : Mood and affect normal. Alert and oriented 3 LE: no edema . Pulses present Skin : No ulcer on a visible skin. No rash noted. Rectal:  Deferred  GI:  Lab Results:  Recent Labs  09/16/16 0339 09/16/16 0353 09/16/16 1147  WBC 10.8*  --   --   HGB 13.5 13.6 13.4  HCT 41.6 40.0 42.4  PLT  223  --   --    BMET  Recent Labs  09/16/16 0339 09/16/16 0353  NA 135 137  K 3.1* 3.1*  CL 97* 95*  CO2 27  --   GLUCOSE 135* 136*  BUN 16 18  CREATININE 1.99* 2.10*  CALCIUM 8.8*  --    LFT  Recent Labs  09/16/16 0339  PROT 6.2*  ALBUMIN 3.5  AST 16  ALT 11*  ALKPHOS 76  BILITOT 0.7   PT/INR No results for input(s): LABPROT, INR in the last 72 hours.   Studies/Results: No results found.  Impression/Plan: ?? Coffee-ground emesis. Patient with normal hemoglobin. Normal BUN despite of having chronic kidney disease.  - Acute on chronic nausea and vomiting. ?? Gastroparesis. / ?? Multifactorial - Chronic pancreatitis based on CT scan of February 2018. - Paroxysmal atrial fibrillation. On Eliquis .  Last dose yesterday. - History of schizophrenia  Recommendations ------------------------- - I do not think patient has any evidence of active bleeding at this time. Small amount of darker stuff in the vomiting could be old food or minimal gastritis. Patient with recurrent nausea and vomiting. CT scan in February 2018 showed no acute changes in the GI system except for changes consistent with chronic pancreatitis. Patient has a normal CBC. Normal LFTs. Normal BUN. - We'll DC Protonix drip. Start IV twice a day PPI. Ultrasound right upper quadrant for further evaluation of recurrent nausea and vomiting. He may need endoscopic evaluation for ongoing nausea and vomiting probably on Monday or Tuesday(last dose of Eliquis  was yesterday,it may need 2-3 days for clearance given his underlying chronic kidney disease.) Consider repeating gastric emptying study as he had a borderline gastric emptying  in 2010. GI will follow    LOS: 0 days   Otis Brace  MD, FACP 09/16/2016, 12:28 PM  Pager [phone] If no answer or after 5 PM call [phone]

## 2016-09-16 NOTE — Progress Notes (Signed)
Patients HR sustaining in the 120-130s.  Dr. Starla Link text paged, received an order Metoprolol 5mg .

## 2016-09-17 LAB — HEMOGLOBIN AND HEMATOCRIT, BLOOD
HCT: 34.5 % — ABNORMAL LOW (ref 39.0–52.0)
HCT: 33.3 % — ABNORMAL LOW (ref 39.0–52.0)
HCT: 33.5 % — ABNORMAL LOW (ref 39.0–52.0)
Hemoglobin: 11.2 g/dL — ABNORMAL LOW (ref 13.0–17.0)
Hemoglobin: 10.8 g/dL — ABNORMAL LOW (ref 13.0–17.0)
Hemoglobin: 10.9 g/dL — ABNORMAL LOW (ref 13.0–17.0)

## 2016-09-17 LAB — CBC WITH DIFFERENTIAL/PLATELET
BASOS ABS: 0 10*3/uL (ref 0.0–0.1)
Basophils Relative: 0 %
EOS PCT: 5 %
Eosinophils Absolute: 0.3 10*3/uL (ref 0.0–0.7)
HCT: 34.8 % — ABNORMAL LOW (ref 39.0–52.0)
Hemoglobin: 11.2 g/dL — ABNORMAL LOW (ref 13.0–17.0)
LYMPHS PCT: 26 %
Lymphs Abs: 1.8 10*3/uL (ref 0.7–4.0)
MCH: 28 pg (ref 26.0–34.0)
MCHC: 32.2 g/dL (ref 30.0–36.0)
MCV: 87 fL (ref 78.0–100.0)
MONO ABS: 0.8 10*3/uL (ref 0.1–1.0)
MONOS PCT: 11 %
Neutro Abs: 4 10*3/uL (ref 1.7–7.7)
Neutrophils Relative %: 58 %
PLATELETS: 195 10*3/uL (ref 150–400)
RBC: 4 MIL/uL — ABNORMAL LOW (ref 4.22–5.81)
RDW: 12.9 % (ref 11.5–15.5)
WBC: 6.8 10*3/uL (ref 4.0–10.5)

## 2016-09-17 LAB — BASIC METABOLIC PANEL WITH GFR
Anion gap: 8 (ref 5–15)
BUN: 14 mg/dL (ref 6–20)
CO2: 25 mmol/L (ref 22–32)
Calcium: 8.5 mg/dL — ABNORMAL LOW (ref 8.9–10.3)
Chloride: 102 mmol/L (ref 101–111)
Creatinine, Ser: 1.87 mg/dL — ABNORMAL HIGH (ref 0.61–1.24)
GFR calc Af Amer: 44 mL/min — ABNORMAL LOW (ref >60)
GFR calc non Af Amer: 38 mL/min — ABNORMAL LOW (ref >60)
Glucose, Bld: 103 mg/dL — ABNORMAL HIGH (ref 65–99)
Potassium: 3.8 mmol/L (ref 3.5–5.1)
Sodium: 135 mmol/L (ref 135–145)

## 2016-09-17 LAB — GLUCOSE, CAPILLARY: Glucose-Capillary: 97 mg/dL (ref 65–99)

## 2016-09-17 MED ORDER — METOPROLOL TARTRATE 25 MG PO TABS
25.0000 mg | ORAL_TABLET | Freq: Two times a day (BID) | ORAL | Status: DC
  Administered 2016-09-17 – 2016-09-19 (×4): 25 mg via ORAL
  Filled 2016-09-17 (×4): qty 1

## 2016-09-17 MED ORDER — SODIUM CHLORIDE 0.9 % IV SOLN: INTRAVENOUS | Status: DC

## 2016-09-17 NOTE — Progress Notes (Signed)
[facility] Progress Note  HUTCH RHETT 56 y.o. August 07, 1960  CC:  GI bleed, nausea, vomiting   Subjective: No further episodes of bleeding. No bowel movement today. Nausea and vomiting improving. Tolerating diet.  ROS : Denied chest pain or shortness of breath.   Objective: Vital signs in last 24 hours: Vitals:   09/17/16 0309 09/17/16 0817  BP: (!) 152/84 (!) 142/94  Pulse: (!) 104   Resp: (!) 21   Temp: 98.2 F (36.8 C) 98.4 F (36.9 C)    Physical Exam:  General:   Alert,  Well-developed, well-nourished, pleasant and cooperative in NAD HEENT : Extraocular movement intact. Normocephalic/atraumatic. No oral lesions. No scleral icterus. Lungs:  Clear throughout to auscultation.   No wheezes, crackles, or rhonchi. No acute distress. Heart:  Regular rate and rhythm; no murmurs, clicks, rubs,  or gallops. Abdomen: Mildly distended without any tenderness. No peritoneal signs. Bowel sounds present. Not able to palpate hepatosplenomegaly. Psych : Mood and affect normal. Alert and oriented 3 LE: no edema . Pulses present   Lab Results:  Recent Labs  09/16/16 0339 09/16/16 0353 09/17/16 0151  NA 135 137 135  K 3.1* 3.1* 3.8  CL 97* 95* 102  CO2 27  --  25  GLUCOSE 135* 136* 103*  BUN 16 18 14   CREATININE 1.99* 2.10* 1.87*  CALCIUM 8.8*  --  8.5*    Recent Labs  09/16/16 0339  AST 16  ALT 11*  ALKPHOS 76  BILITOT 0.7  PROT 6.2*  ALBUMIN 3.5    Recent Labs  09/16/16 0339  09/16/16 1806 09/17/16 0151  WBC 10.8*  --   --  6.8  NEUTROABS 7.6  --   --  4.0  HGB 13.5  < > 11.5* 11.2*  11.2*  HCT 41.6  < > 36.2* 34.8*  34.5*  MCV 85.6  --   --  87.0  PLT 223  --   --  195  < > = values in this interval not displayed. No results for input(s): LABPROT, INR in the last 72 hours.    Assessment/Plan:  Coffee-ground emesis With some drop in hemoglobin. Normal BUN despite of having chronic kidney disease.  - Acute on chronic nausea and  vomiting. ?? Gastroparesis. / ?? Multifactorial - Chronic pancreatitis based on CT scan of February 2018. - Paroxysmal atrial fibrillation. On Eliquis .  Last dose 09/15/2016  - History of schizophrenia  Recommendations ------------------------- - Patient with small amount of coffee-ground emesis  with recurrent nausea and vomiting. Drop in hemoglobin noted , part of it could be dilutional effect. Continue IV twice a day PPI. Ultrasound right upper quadrant essentially normal.  - EGD tomorrow for further evaluation. Last dose of Eliquis  on 09/15/2016. Risk benefits alternatives discussed with the patient. Verbalized understanding.  - Consider repeating gastric emptying study for recurrent nausea and vomiting as he had a borderline gastric emptying  in 2010. -  GI will follow   Otis Brace MD, FACP 09/17/2016, 10:44 AM  Pager [phone]  If no answer or after 5 PM call [phone]

## 2016-09-17 NOTE — Progress Notes (Signed)
Patient ID: Peter Marsh, male   DOB: Sep 14, 1960, 56 y.o.   MRN: [id number]  PROGRESS NOTE    Peter Marsh  EHU:[id number] DOB: 1960-05-29 DOA: 09/16/2016 PCP: Marda Stalker, PA-C   Brief Narrative:  56 y.o.malewith medical history significant for schizophrenia, chronic kidney disease stage 3-4, asthma, paroxysmal atrial flutter on Eliquis, hypertension, GERD, remote history of peptic ulcer disease, presented to the emergency department for evaluation of abdominal pain, nausea, vomiting, and coffee ground emesis. He was started on intravenous Protonix. GI is following.   Assessment & Plan:   Principal Problem:   Coffee ground emesis Active Problems:   Cough variant asthma   CKD (chronic kidney disease), stage III   Schizophrenia ([name])   Paroxysmal atrial fibrillation ([name])   Upper GI bleed  1. Probable Upper GI bleed: - No further episodes of hematemesis. Hemoglobin stable. Continue with Protonix. - Probable plan for EGD tomorrow by GI -  Monitor H&H. - hold Eliquis   2. Paroxysmal atrial flutter  - Start oral metoprolol for heart rate controlled -Hold Eliquis for now  3. CKD stage III  - Stable - Monitor creatinine - Avoid nephrotoxins where possible, renally-dose medications   4. Asthma  - Mild intermittent, stable  - Continue prn albuterol   5. Schizophrenia  - Appears to be stable  - Continue Risperdal, Wellbutrin   6. Hypertension - BP at goal  - Continue hydralazine as tolerated   7. Hypokalemia  - Improved    DVT prophylaxis: SCDs Code Status:  Full Family Communication: None at bedside Disposition Plan: Home in 2-3 days  Consultants: GI  Procedures: None  Antimicrobials: None   Subjective: Patient seen and examined at bedside. He denies any current nausea or vomiting  Objective: Vitals:   09/16/16 2353 09/17/16 0309 09/17/16 0730 09/17/16 0817  BP: 132/85 (!) 152/84  (!) 142/94  Pulse: (!) 107 (!) 104    Resp: 18  (!) 21    Temp: 99.1 F (37.3 C) 98.2 F (36.8 C)  98.4 F (36.9 C)  TempSrc: Oral Oral  Oral  SpO2: 94% 94% 92%   Weight:  102.1 kg (225 lb)    Height:        Intake/Output Summary (Last 24 hours) at 09/17/16 1129 Last data filed at 09/17/16 0946  Gross per 24 hour  Intake          1525.84 ml  Output              950 ml  Net           575.84 ml   Filed Weights   09/16/16 1500 09/17/16 0309  Weight: 101.1 kg (222 lb 14.4 oz) 102.1 kg (225 lb)    Examination:  General exam: Appears calm and comfortable  Respiratory system: Bilateral decreased breath sound at bases Cardiovascular system:S1-S2 positive, intermittently tachycardic  Gastrointestinal system: Abdomen is nondistended, soft and nontender. No organomegaly or masses felt. Normal bowel sounds heard. Extremities: No cyanosis, clubbing, edema    Data Reviewed: I have personally reviewed following labs and imaging studies  CBC:  Recent Labs Lab 09/16/16 0339 09/16/16 0353 09/16/16 1147 09/16/16 1806 09/17/16 0151  WBC 10.8*  --   --   --  6.8  NEUTROABS 7.6  --   --   --  4.0  HGB 13.5 13.6 13.4 11.5* 11.2*  11.2*  HCT 41.6 40.0 42.4 36.2* 34.8*  34.5*  MCV 85.6  --   --   --  87.0  PLT 223  --   --   --  951   Basic Metabolic Panel:  Recent Labs Lab 09/16/16 0339 09/16/16 0353 09/17/16 0151  NA 135 137 135  K 3.1* 3.1* 3.8  CL 97* 95* 102  CO2 27  --  25  GLUCOSE 135* 136* 103*  BUN 16 18 14   CREATININE 1.99* 2.10* 1.87*  CALCIUM 8.8*  --  8.5*   GFR: Estimated Creatinine Clearance: 53.9 mL/min (A) (by C-G formula based on SCr of 1.87 mg/dL (H)). Liver Function Tests:  Recent Labs Lab 09/16/16 0339  AST 16  ALT 11*  ALKPHOS 76  BILITOT 0.7  PROT 6.2*  ALBUMIN 3.5    Recent Labs Lab 09/16/16 0339  LIPASE 17   No results for input(s): AMMONIA in the last 168 hours. Coagulation Profile: No results for input(s): INR, PROTIME in the last 168 hours. Cardiac Enzymes: No results  for input(s): CKTOTAL, CKMB, CKMBINDEX, TROPONINI in the last 168 hours. BNP (last 3 results) No results for input(s): PROBNP in the last 8760 hours. HbA1C: No results for input(s): HGBA1C in the last 72 hours. CBG:  Recent Labs Lab 09/16/16 0830 09/17/16 0847  GLUCAP 124* 97   Lipid Profile: No results for input(s): CHOL, HDL, LDLCALC, TRIG, CHOLHDL, LDLDIRECT in the last 72 hours. Thyroid Function Tests: No results for input(s): TSH, T4TOTAL, FREET4, T3FREE, THYROIDAB in the last 72 hours. Anemia Panel: No results for input(s): VITAMINB12, FOLATE, FERRITIN, TIBC, IRON, RETICCTPCT in the last 72 hours. Sepsis Labs: No results for input(s): PROCALCITON, LATICACIDVEN in the last 168 hours.  Recent Results (from the past 240 hour(s))  MRSA PCR Screening     Status: None   Collection Time: 09/16/16  3:06 PM  Result Value Ref Range Status   MRSA by PCR NEGATIVE NEGATIVE Final    Comment:        The GeneXpert MRSA Assay (FDA approved for NASAL specimens only), is one component of a comprehensive MRSA colonization surveillance program. It is not intended to diagnose MRSA infection nor to guide or monitor treatment for MRSA infections.          Radiology Studies: US Abdomen Limited Ruq  Result Date: 09/16/2016 CLINICAL DATA:  Vomiting and nausea. EXAM: US ABDOMEN LIMITED - RIGHT UPPER QUADRANT COMPARISON:  CT scan June 17, 2016 FINDINGS: Gallbladder: No gallstones or wall thickening visualized. No sonographic Murphy sign noted by sonographer. Common bile duct: Diameter: 3.2 mm. The common bile duct was difficult to visualize well however. Liver: The left hepatic lobe was not seen due to shadowing bowel gas. Other: The patient's known prominent right renal pelvis is identified, measuring up to 6.2 cm. IMPRESSION: 1. Limited study. No abnormality identified in the gallbladder, common bile duct, or liver. 2. Large cystic mass in the right kidney appears to correlate with the  dilated right renal pelvis seen in February of 2018. Electronically Signed   By: Dorise Bullion III M.D   On: 09/16/2016 13:54        Scheduled Meds: . buPROPion  150 mg Oral BH-q7a  . hydrALAZINE  50 mg Oral TID  . mometasone-formoterol  2 puff Inhalation BID  . montelukast  10 mg Oral QHS  . pantoprazole (PROTONIX) IV  40 mg Intravenous Q12H  . risperiDONE  1 mg Oral QHS  . sodium chloride flush  3 mL Intravenous Q12H  . tamsulosin  0.4 mg Oral Daily   Continuous Infusions:   LOS: 1 day  Aline August, MD [facility] Pager [phone]  If 7PM-7AM, please contact night-coverage [email or web address] Password [facility] 09/17/2016, 11:29 AM

## 2016-09-18 ENCOUNTER — Inpatient Hospital Stay (HOSPITAL_COMMUNITY): Payer: PPO | Admitting: Certified Registered Nurse Anesthetist

## 2016-09-18 ENCOUNTER — Encounter (HOSPITAL_COMMUNITY): Payer: Self-pay | Admitting: Anesthesiology

## 2016-09-18 ENCOUNTER — Encounter (HOSPITAL_COMMUNITY)
Admission: EM | Disposition: A | Discharge status: Home / Self Care | Payer: Self-pay | Source: Home / Self Care | Attending: Internal Medicine

## 2016-09-18 HISTORY — PX: ESOPHAGOGASTRODUODENOSCOPY (EGD) WITH PROPOFOL: SHX5813

## 2016-09-18 LAB — CBC WITH DIFFERENTIAL/PLATELET
Basophils Absolute: 0 10*3/uL (ref 0.0–0.1)
Basophils Relative: 0 %
EOS ABS: 0.5 10*3/uL (ref 0.0–0.7)
Eosinophils Relative: 6 %
HEMATOCRIT: 33.6 % — AB (ref 39.0–52.0)
HEMOGLOBIN: 10.9 g/dL — AB (ref 13.0–17.0)
LYMPHS ABS: 1.9 10*3/uL (ref 0.7–4.0)
Lymphocytes Relative: 25 %
MCH: 28.2 pg (ref 26.0–34.0)
MCHC: 32.4 g/dL (ref 30.0–36.0)
MCV: 86.8 fL (ref 78.0–100.0)
MONO ABS: 0.6 10*3/uL (ref 0.1–1.0)
Monocytes Relative: 8 %
NEUTROS PCT: 61 %
Neutro Abs: 4.6 10*3/uL (ref 1.7–7.7)
Platelets: 208 10*3/uL (ref 150–400)
RBC: 3.87 MIL/uL — ABNORMAL LOW (ref 4.22–5.81)
RDW: 12.9 % (ref 11.5–15.5)
WBC: 7.6 10*3/uL (ref 4.0–10.5)

## 2016-09-18 LAB — BASIC METABOLIC PANEL
Anion gap: 7 (ref 5–15)
BUN: 11 mg/dL (ref 6–20)
CALCIUM: 8.8 mg/dL — AB (ref 8.9–10.3)
CHLORIDE: 102 mmol/L (ref 101–111)
CO2: 27 mmol/L (ref 22–32)
Creatinine, Ser: 1.77 mg/dL — ABNORMAL HIGH (ref 0.61–1.24)
GFR calc Af Amer: 47 mL/min — ABNORMAL LOW (ref >60)
GFR calc non Af Amer: 41 mL/min — ABNORMAL LOW (ref >60)
Glucose, Bld: 99 mg/dL (ref 65–99)
Potassium: 3.9 mmol/L (ref 3.5–5.1)
SODIUM: 136 mmol/L (ref 135–145)

## 2016-09-18 LAB — GLUCOSE, CAPILLARY: Glucose-Capillary: 96 mg/dL (ref 65–99)

## 2016-09-18 SURGERY — ESOPHAGOGASTRODUODENOSCOPY (EGD) WITH PROPOFOL
Anesthesia: Monitor Anesthesia Care

## 2016-09-18 MED ORDER — ONDANSETRON HCL 4 MG/2ML IJ SOLN: 4.0000 mg | Freq: Once | INTRAMUSCULAR | Status: DC | PRN

## 2016-09-18 MED ORDER — LACTATED RINGERS IV SOLN
INTRAVENOUS | Status: DC
  Administered 2016-09-18 – 2016-09-19 (×3): via INTRAVENOUS

## 2016-09-18 MED ORDER — SUCRALFATE 1 G PO TABS: 1.0000 g | ORAL_TABLET | Freq: Two times a day (BID) | ORAL | Status: DC

## 2016-09-18 MED ORDER — PANTOPRAZOLE SODIUM 40 MG IV SOLR
40.0000 mg | Freq: Two times a day (BID) | INTRAVENOUS | Status: DC
  Administered 2016-09-18: 40 mg via INTRAVENOUS
  Filled 2016-09-18: qty 40

## 2016-09-18 MED ORDER — SUCRALFATE 1 G PO TABS
1.0000 g | ORAL_TABLET | Freq: Two times a day (BID) | ORAL | Status: DC
  Administered 2016-09-18 – 2016-09-19 (×3): 1 g via ORAL
  Filled 2016-09-18 (×4): qty 1

## 2016-09-18 MED ORDER — PANTOPRAZOLE SODIUM 40 MG PO TBEC
40.0000 mg | DELAYED_RELEASE_TABLET | Freq: Two times a day (BID) | ORAL | Status: DC
  Administered 2016-09-18: 40 mg via ORAL
  Filled 2016-09-18 (×2): qty 1

## 2016-09-18 MED ORDER — HYDROMORPHONE HCL 1 MG/ML IJ SOLN: 0.2500 mg | INTRAMUSCULAR | Status: DC | PRN

## 2016-09-18 MED ORDER — PROPOFOL 10 MG/ML IV BOLUS
INTRAVENOUS | Status: DC | PRN
  Administered 2016-09-18: 20 mg via INTRAVENOUS

## 2016-09-18 MED ORDER — MEPERIDINE HCL 25 MG/ML IJ SOLN: 6.2500 mg | INTRAMUSCULAR | Status: DC | PRN

## 2016-09-18 MED ORDER — PROPOFOL 500 MG/50ML IV EMUL
INTRAVENOUS | Status: DC | PRN
  Administered 2016-09-18: 100 ug/kg/min via INTRAVENOUS

## 2016-09-18 NOTE — Transfer of Care (Signed)
Immediate Anesthesia Transfer of Care Note  Patient: Peter Marsh  Procedure(s) Performed: Procedure(s): ESOPHAGOGASTRODUODENOSCOPY (EGD) WITH PROPOFOL (N/A)  Patient Location: Endoscopy Unit  Anesthesia Type:MAC  Level of Consciousness: awake, alert  and oriented  Airway & Oxygen Therapy: Patient Spontanous Breathing and Patient connected to nasal cannula oxygen  Post-op Assessment: Report given to RN and Post -op Vital signs reviewed and stable  Post vital signs: Reviewed and stable  Last Vitals:  Vitals:   09/18/16 0758 09/18/16 0947  BP: (!) 155/94 (!) 188/98  Pulse: 73   Resp: 19 16  Temp: 36.9 C 37.2 C    Last Pain:  Vitals:   09/18/16 0947  TempSrc: Oral  PainSc:          Complications: No apparent anesthesia complications

## 2016-09-18 NOTE — Evaluation (Signed)
Physical Therapy Evaluation & Discharge Patient Details Name: Peter Marsh MRN: [id number] DOB: 14-Jun-1960 Today's Date: 09/18/2016   History of Present Illness  56 y.o. male with medical history significant for schizophrenia, chronic kidney disease stage 3-4, asthma, paroxysmal atrial flutter on Eliquis, hypertension, GERD, remote history of peptic ulcer disease, presented to the emergency department for evaluation of abdominal pain, nausea, vomiting, and coffee ground emesis.  Clinical Impression  Patient presents close to functional baseline  Without current need for skilled PT.  Will sign off.    Follow Up Recommendations No PT follow up    Equipment Recommendations  None recommended by PT    Recommendations for Other Services       Precautions / Restrictions Precautions Precautions: None      Mobility  Bed Mobility Overal bed mobility: Modified Independent                Transfers Overall transfer level: Modified independent                  Ambulation/Gait Ambulation/Gait assistance: Independent Ambulation Distance (Feet): 400 Feet Assistive device: None Gait Pattern/deviations: Step-through pattern;Wide base of support     General Gait Details: heavy steps, but no LOB even with head turns to look at me during ambulation  Stairs            Wheelchair Mobility    Modified Rankin (Stroke Patients Only)       Balance Overall balance assessment: Independent                                           Pertinent Vitals/Pain Pain Assessment: No/denies pain    Home Living Family/patient expects to be discharged to:: Private residence Living Arrangements: Spouse/significant other Available Help at Discharge: Family Type of Home: Apartment       Home Layout: One level Home Equipment: None      Prior Function Level of Independence: Independent               Hand Dominance        Extremity/Trunk  Assessment        Lower Extremity Assessment Lower Extremity Assessment: Overall WFL for tasks assessed       Communication   Communication: No difficulties  Cognition Arousal/Alertness: Awake/alert Behavior During Therapy: WFL for tasks assessed/performed Overall Cognitive Status: Within Functional Limits for tasks assessed                                        General Comments      Exercises     Assessment/Plan    PT Assessment Patent does not need any further PT services  PT Problem List         PT Treatment Interventions      PT Goals (Current goals can be found in the Care Plan section)  Acute Rehab PT Goals PT Goal Formulation: All assessment and education complete, DC therapy    Frequency     Barriers to discharge        Co-evaluation               AM-PAC PT "6 Clicks" Daily Activity  Outcome Measure Difficulty turning over in bed (including adjusting bedclothes, sheets and blankets)?: None Difficulty moving from lying  on back to sitting on the side of the bed? : None Difficulty sitting down on and standing up from a chair with arms (e.g., wheelchair, bedside commode, etc,.)?: None Help needed moving to and from a bed to chair (including a wheelchair)?: None Help needed walking in hospital room?: None Help needed climbing 3-5 steps with a railing? : A Little 6 Click Score: 23    End of Session   Activity Tolerance: Patient tolerated treatment well Patient left: in chair;with call bell/phone within reach;with family/visitor present        Time: 1300-1318 PT Time Calculation (min) (ACUTE ONLY): 18 min   Charges:   PT Evaluation $PT Eval Low Complexity: 1 Procedure     PT G CodesMagda Kiel, PT [phone] 09/18/2016   Reginia Naas 09/18/2016, 2:55 PM

## 2016-09-18 NOTE — Progress Notes (Signed)
Patient to transfer to [phone] report given to receiving nurse Abbie, all questions answered at this time.  Pt. VSS with no s/s of distress noted.  Patient stable for transfer.

## 2016-09-18 NOTE — Care Management Note (Signed)
Case Management Note  Patient Details  Name: Peter Marsh MRN: [id number] Date of Birth: Aug 14, 1960  Subjective/Objective:   From home , presents with  Coffee ground emesis, has ckd, schizophrenia, pafib, cought variant asthma.  Per pt eval no pt f/u needed.               Action/Plan: NCM will follow for dc needs.   Expected Discharge Date:                  Expected Discharge Plan:  Home/Self Care  In-House Referral:     Discharge planning Services  CM Consult  Post Acute Care Choice:    Choice offered to:     DME Arranged:    DME Agency:     HH Arranged:    HH Agency:     Status of Service:  In process, will continue to follow  If discussed at Long Length of Stay Meetings, dates discussed:    Additional Comments:  Zenon Mayo, RN 09/18/2016, 3:21 PM

## 2016-09-18 NOTE — Progress Notes (Signed)
PT Cancellation Note  Patient Details Name: Peter Marsh MRN: [id number] DOB: [birth year]   Cancelled Treatment:    Reason Eval/Treat Not Completed: Patient at procedure or test/unavailable; patient out of room for EGD.  Will attempt later as time permits.   Reginia Naas 09/18/2016, 10:36 AM  Magda Kiel, [place] 09/18/2016

## 2016-09-18 NOTE — Anesthesia Preprocedure Evaluation (Addendum)
Anesthesia Evaluation  Patient identified by MRN, date of birth, ID band Patient awake    Reviewed: Allergy & Precautions, NPO status , Patient's Chart, lab work & pertinent test results  History of Anesthesia Complications (+) PONV and history of anesthetic complications  Airway Mallampati: I  TM Distance: >3 FB Neck ROM: Full    Dental   Pulmonary asthma , former smoker,    Pulmonary exam normal        Cardiovascular hypertension, Pt. on medications Normal cardiovascular exam+ dysrhythmias Atrial Fibrillation      Neuro/Psych PSYCHIATRIC DISORDERS Depression Schizophrenia negative neurological ROS     GI/Hepatic Neg liver ROS, GERD  Medicated and Controlled,  Endo/Other  negative endocrine ROS  Renal/GU CRFRenal disease  negative genitourinary   Musculoskeletal negative musculoskeletal ROS (+)   Abdominal   Peds negative pediatric ROS (+)  Hematology negative hematology ROS (+)   Anesthesia Other Findings Day of surgery medications reviewed with the patient.  Reproductive/Obstetrics negative OB ROS                            Lab Results  Component Value Date   WBC 7.6 09/18/2016   HGB 10.9 (L) 09/18/2016   HCT 33.6 (L) 09/18/2016   MCV 86.8 09/18/2016   PLT 208 09/18/2016   Lab Results  Component Value Date   CREATININE 1.77 (H) 09/18/2016   BUN 11 09/18/2016   NA 136 09/18/2016   K 3.9 09/18/2016   CL 102 09/18/2016   CO2 27 09/18/2016   Lab Results  Component Value Date   INR 1.00 04/17/2011   INR 0.94 01/10/2010   05/2016 EKG: normal sinus rhythm, LBBB.    Anesthesia Physical Anesthesia Plan  ASA: III  Anesthesia Plan: MAC   Post-op Pain Management:    Induction: Intravenous  Airway Management Planned: Simple Face Mask  Additional Equipment:   Intra-op Plan:   Post-operative Plan:   Informed Consent: I have reviewed the patients History and Physical,  chart, labs and discussed the procedure including the risks, benefits and alternatives for the proposed anesthesia with the patient or authorized representative who has indicated his/her understanding and acceptance.     Plan Discussed with: CRNA and Surgeon  Anesthesia Plan Comments:        Anesthesia Quick Evaluation

## 2016-09-18 NOTE — Anesthesia Postprocedure Evaluation (Signed)
Anesthesia Post Note  Patient: Peter Marsh  Procedure(s) Performed: Procedure(s) (LRB): ESOPHAGOGASTRODUODENOSCOPY (EGD) WITH PROPOFOL (N/A)  Anesthesia Type: MAC       Last Vitals:  Vitals:   09/18/16 1045 09/18/16 1106  BP: (!) 165/77 (!) 177/85  Pulse: 72 77  Resp: 19 13  Temp:  36.6 C    Last Pain:  Vitals:   09/18/16 1106  TempSrc: Oral  PainSc:                  Maggie Dworkin DAVID

## 2016-09-18 NOTE — Interval H&P Note (Signed)
History and Physical Interval Note: EGD being performed for coffee-ground emesis, mild drop in hemoglobin and a normal BUN. Eliquis on hold since 09/15/16.  09/18/2016 10:11 AM  Peter Marsh  has presented today for surgery, with the diagnosis of Coffee-ground emesis  The various methods of treatment have been discussed with the patient and family. After consideration of risks, benefits and other options for treatment, the patient has consented to  Procedure(s): ESOPHAGOGASTRODUODENOSCOPY (EGD) WITH PROPOFOL (N/A) as a surgical intervention .  The patient's history has been reviewed, patient examined, no change in status, stable for surgery.  I have reviewed the patient's chart and labs.  Questions were answered to the patient's satisfaction.     Ronnette Juniper

## 2016-09-18 NOTE — Op Note (Signed)
[facility] Patient Name: Peter Marsh Procedure Date : 09/18/2016 MRN: [id number] Attending MD: Ronnette Juniper , MD Date of Birth: 1961-01-25 CSN: [id number] Age: 56 Admit Type: Inpatient Procedure:                Upper GI endoscopy Indications:              Epigastric abdominal pain, Coffee-ground emesis,                            Nausea with vomiting Providers:                Ronnette Juniper, MD, Kingsley Plan, RN, Cletis Athens,                            Technician Referring MD:              Medicines:                Monitored Anesthesia Care Complications:            No immediate complications. Estimated Blood Loss:     Estimated blood loss: none. Procedure:                Pre-Anesthesia Assessment:                           - Prior to the procedure, a History and Physical                            was performed, and patient medications and                            allergies were reviewed. The patient's tolerance of                            previous anesthesia was also reviewed. The risks                            and benefits of the procedure and the sedation                            options and risks were discussed with the patient.                            All questions were answered, and informed consent                            was obtained. Prior Anticoagulants: The patient has                            taken Eliquis (apixaban), last dose was 2 days                            prior to procedure. ASA Grade Assessment: III - A  patient with severe systemic disease. After                            reviewing the risks and benefits, the patient was                            deemed in satisfactory condition to undergo the                            procedure.                           After obtaining informed consent, the endoscope was                            passed under direct vision. Throughout the                             procedure, the patient's blood pressure, pulse, and                            oxygen saturations were monitored continuously. The                            [id number]) scope was introduced through the                            mouth, and advanced to the second part of duodenum.                            The upper GI endoscopy was accomplished without                            difficulty. The patient tolerated the procedure                            well. Scope In: Scope Out: Findings:      Multiple, superficial esophageal ulcers with clean base, with no       bleeding and no stigmata of recent bleeding were found 25 to 35 cm from       the incisors. The largest lesion was 20 mm in largest dimension.       Biopsies were taken with a cold forceps for histology.      The Z-line was irregular and nodular and was found 35 cm from the       incisors.      The entire examined stomach was normal. Biopsies were taken with a cold       forceps for Helicobacter pylori testing.      The cardia and gastric fundus were normal on retroflexion.      The ampulla, duodenal bulb, first portion of the duodenum and second       portion of the duodenum were normal. Impression:               - Non-bleeding esophageal ulcers. Biopsied.                           -  Z-line irregular, 35 cm from the incisors.                           - Normal stomach. Biopsied.                           - Normal ampulla, duodenal bulb, first portion of                            the duodenum and second portion of the duodenum. Moderate Sedation:      Moderate (conscious) sedation was personally administered by an       anesthesia professional. The following parameters were monitored: oxygen       saturation, heart rate, blood pressure, and response to care. Recommendation:           - Mechanical soft diet.                           - Use Protonix (pantoprazole) 40 mg PO BID for 8                             weeks.                           - Use sucralfate tablets 1 gram PO BID for 2 weeks.                           - Resume Eliquis (apixaban) at prior dose tomorrow.                            Refer to primary physician for further adjustment                            of therapy.                           - Await pathology results.                           - Repeat upper endoscopy in 8 weeks to check                            healing. Procedure Code(s):        --- Professional ---                           [phone], Esophagogastroduodenoscopy, flexible,                            transoral; with biopsy, single or multiple Diagnosis Code(s):        --- Professional ---                           K22.10, Ulcer of esophagus without bleeding  K22.8, Other specified diseases of esophagus                           R10.13, Epigastric pain                           K92.0, Hematemesis                           R11.2, Nausea with vomiting, unspecified CPT copyright 2016 [employer]. All rights reserved. The codes documented in this report are preliminary and upon coder review may  be revised to meet current compliance requirements. Ronnette Juniper, MD 09/18/2016 10:37:30 AM This report has been signed electronically. Number of Addenda: 0

## 2016-09-18 NOTE — Brief Op Note (Signed)
09/16/2016 - 09/18/2016  10:37 AM  PATIENT:  Peter Marsh  56 y.o. male  PRE-OPERATIVE DIAGNOSIS:  Coffee-ground emesis  POST-OPERATIVE DIAGNOSIS:  esophageal ulcers, biopsies taken also biopsies og gastric antrum for H. Pylori  PROCEDURE:  Procedure(s): ESOPHAGOGASTRODUODENOSCOPY (EGD) WITH PROPOFOL (N/A)  SURGEON:  Surgeon(s) and Role:    Ronnette Juniper, MD - Primary  PHYSICIAN ASSISTANT:   ASSISTANTS: none   ANESTHESIA:   general  EBL:  Total I/O In: 150 [I.V.:150] Out: -   BLOOD ADMINISTERED:none  DRAINS: none   LOCAL MEDICATIONS USED:  NONE  SPECIMEN:  Biopsy / Limited Resection  DISPOSITION OF SPECIMEN:  PATHOLOGY  COUNTS:  YES  TOURNIQUET:  * No tourniquets in log *  DICTATION: .Dragon Dictation  PLAN OF CARE: Admit to inpatient   PATIENT DISPOSITION:  PACU - hemodynamically stable.   Delay start of Pharmacological VTE agent (>24hrs) due to surgical blood loss or risk of bleeding: yes

## 2016-09-18 NOTE — Progress Notes (Addendum)
Patient ID: Peter Marsh, male   DOB: 1960-05-11, 56 y.o.   MRN: [id number]  PROGRESS NOTE    ASCHER SCHROEPFER  OEU:[id number] DOB: 09-28-1960 DOA: 09/16/2016 PCP: Marda Stalker, PA-C   Brief Narrative:  56 y.o.malewith medical history significant for schizophrenia, chronic kidney disease stage 3-4, asthma, paroxysmal atrial flutter on Eliquis, hypertension, GERD, remote history of peptic ulcer disease, presented to the emergency department for evaluation of abdominal pain, nausea, vomiting, and coffee ground emesis. He was started on intravenous Protonix. He is planned for upper GI endoscopy today.    Assessment & Plan:   Principal Problem:   Coffee ground emesis Active Problems:   Cough variant asthma   CKD (chronic kidney disease), stage III   Schizophrenia ([name])   Paroxysmal atrial fibrillation ([name])   Upper GI bleed     1. Acute blood loss anemia duet to Probable Upper GI bleed: - No further episodes of hematemesis. Hemoglobin stable. Continue with Protonix. - EGD planned for today by GI -  Monitor H&H. -hold Eliquis   2. Paroxysmal atrial flutter  - Heart rate controlled. Continue with metoprolol -Hold Eliquis for now  3. CKD stage III  - Stable - Monitor creatinine - Avoid nephrotoxins where possible, renally-dose medications   4. Asthma  - Mild intermittent, stable  - Continue prn albuterol   5. Schizophrenia  - Appears to be stable  - Continue Risperdal, Wellbutrin   6. Hypertension - BP at goal  - Continue hydralazine as tolerated   7. Hypokalemia  - Improved    DVT prophylaxis:SCDs Code Status:Full Family Communication:None at bedside Disposition Plan:Home in 1-2 days  Consultants:GI  Procedures:None  Antimicrobials: None  Subjective: Patient seen and examined at bedside. She denies any overnight fever, nausea, vomiting.  Objective: Vitals:   09/18/16 1035 09/18/16 1045 09/18/16 1106 09/18/16 1112  BP:  (!) 125/54 (!) 165/77 (!) 177/85   Pulse: 71 72 77 74  Resp: 17 19 13 18   Temp:   97.9 F (36.6 C)   TempSrc:   Oral   SpO2: 98% 98% 99% 97%  Weight:      Height:        Intake/Output Summary (Last 24 hours) at 09/18/16 1209 Last data filed at 09/18/16 1027  Gross per 24 hour  Intake              150 ml  Output              550 ml  Net             -400 ml   Filed Weights   09/16/16 1500 09/17/16 0309 09/18/16 0947  Weight: 101.1 kg (222 lb 14.4 oz) 102.1 kg (225 lb) 102.1 kg (225 lb)    Examination:  General exam: Appears calm and comfortable  Respiratory system: Bilateral decreased breath sound at bases Cardiovascular system: S1 & S2 heard,Rate controlled  Gastrointestinal system: Abdomen is nondistended, soft and nontender. Normal bowel sounds heard. Extremities: No cyanosis, clubbing, edema   Data Reviewed: I have personally reviewed following labs and imaging studies  CBC:  Recent Labs Lab 09/16/16 0339  09/16/16 1806 09/17/16 0151 09/17/16 1050 09/17/16 1749 09/18/16 0227  WBC 10.8*  --   --  6.8  --   --  7.6  NEUTROABS 7.6  --   --  4.0  --   --  4.6  HGB 13.5  < > 11.5* 11.2*  11.2* 10.8* 10.9* 10.9*  HCT 41.6  < >  36.2* 34.8*  34.5* 33.3* 33.5* 33.6*  MCV 85.6  --   --  87.0  --   --  86.8  PLT 223  --   --  195  --   --  208  < > = values in this interval not displayed. Basic Metabolic Panel:  Recent Labs Lab 09/16/16 0339 09/16/16 0353 09/17/16 0151 09/18/16 0227  NA 135 137 135 136  K 3.1* 3.1* 3.8 3.9  CL 97* 95* 102 102  CO2 27  --  25 27  GLUCOSE 135* 136* 103* 99  BUN 16 18 14 11   CREATININE 1.99* 2.10* 1.87* 1.77*  CALCIUM 8.8*  --  8.5* 8.8*   GFR: Estimated Creatinine Clearance: 56.9 mL/min (A) (by C-G formula based on SCr of 1.77 mg/dL (H)). Liver Function Tests:  Recent Labs Lab 09/16/16 0339  AST 16  ALT 11*  ALKPHOS 76  BILITOT 0.7  PROT 6.2*  ALBUMIN 3.5    Recent Labs Lab 09/16/16 0339  LIPASE 17   No  results for input(s): AMMONIA in the last 168 hours. Coagulation Profile: No results for input(s): INR, PROTIME in the last 168 hours. Cardiac Enzymes: No results for input(s): CKTOTAL, CKMB, CKMBINDEX, TROPONINI in the last 168 hours. BNP (last 3 results) No results for input(s): PROBNP in the last 8760 hours. HbA1C: No results for input(s): HGBA1C in the last 72 hours. CBG:  Recent Labs Lab 09/16/16 0830 09/17/16 0847 09/18/16 0757  GLUCAP 124* 97 96   Lipid Profile: No results for input(s): CHOL, HDL, LDLCALC, TRIG, CHOLHDL, LDLDIRECT in the last 72 hours. Thyroid Function Tests: No results for input(s): TSH, T4TOTAL, FREET4, T3FREE, THYROIDAB in the last 72 hours. Anemia Panel: No results for input(s): VITAMINB12, FOLATE, FERRITIN, TIBC, IRON, RETICCTPCT in the last 72 hours. Sepsis Labs: No results for input(s): PROCALCITON, LATICACIDVEN in the last 168 hours.  Recent Results (from the past 240 hour(s))  MRSA PCR Screening     Status: None   Collection Time: 09/16/16  3:06 PM  Result Value Ref Range Status   MRSA by PCR NEGATIVE NEGATIVE Final    Comment:        The GeneXpert MRSA Assay (FDA approved for NASAL specimens only), is one component of a comprehensive MRSA colonization surveillance program. It is not intended to diagnose MRSA infection nor to guide or monitor treatment for MRSA infections.          Radiology Studies: US Abdomen Limited Ruq  Result Date: 09/16/2016 CLINICAL DATA:  Vomiting and nausea. EXAM: US ABDOMEN LIMITED - RIGHT UPPER QUADRANT COMPARISON:  CT scan June 17, 2016 FINDINGS: Gallbladder: No gallstones or wall thickening visualized. No sonographic Murphy sign noted by sonographer. Common bile duct: Diameter: 3.2 mm. The common bile duct was difficult to visualize well however. Liver: The left hepatic lobe was not seen due to shadowing bowel gas. Other: The patient's known prominent right renal pelvis is identified, measuring up  to 6.2 cm. IMPRESSION: 1. Limited study. No abnormality identified in the gallbladder, common bile duct, or liver. 2. Large cystic mass in the right kidney appears to correlate with the dilated right renal pelvis seen in February of 2018. Electronically Signed   By: Dorise Bullion III M.D   On: 09/16/2016 13:54        Scheduled Meds: . buPROPion  150 mg Oral [id number]  . hydrALAZINE  50 mg Oral TID  . metoprolol tartrate  25 mg Oral BID  . mometasone-formoterol  2 puff Inhalation BID  . montelukast  10 mg Oral QHS  . pantoprazole (PROTONIX) IV  40 mg Intravenous Q12H  . risperiDONE  1 mg Oral QHS  . sodium chloride flush  3 mL Intravenous Q12H  . tamsulosin  0.4 mg Oral Daily   Continuous Infusions: . lactated ringers 10 mL/hr at 09/18/16 0950     LOS: 2 days        Aline August, MD [facility] Pager [phone]  If 7PM-7AM, please contact night-coverage [email or web address] Password [facility] 09/18/2016, 12:09 PM

## 2016-09-18 NOTE — H&P (Signed)
Peter Marsh is an 56 y.o. male.   Chief Complaint: Coffee-ground emesis, nausea, vomiting and epigastric pain  HPI: 56 year old male, [facility] last dose on 09/15/2016, presents with coffee-ground emesi,s is being scheduled for a diagnostic EGD today.  Prior endoscopy. Past Medical History:  Diagnosis Date  . Asthma    uses inhalers   . Bilateral carotid bruits   . Chronic kidney disease    bladder interstial cystitis   . Chronic kidney disease (CKD), stage IV (severe) ([name])    follow[facility] at [facility]  . Depression   . GERD (gastroesophageal reflux disease)   . History of stomach ulcers 2001  . Hypertension   . LBBB (left bundle branch block)   . Lower extremity edema   . Mild intermittent asthma without complication   . Mixed hyperlipidemia   . Paroxysmal atrial flutter ([facility])   . PONV (postoperative nausea and vomiting)   . Schizophrenia [facility])     Past Surgical History:  Procedure Laterality Date  . APPENDECTOMY    . LUMBAR LAMINECTOMY/DECOMPRESSION MICRODISCECTOMY  04/19/2011   Procedure: LUMBAR LAMINECTOMY/DECOMPRESSION MICRODISCECTOMY;  Surgeon: Tobi Bastos;  Location: [facility];  Service: Orthopedics;  Laterality: Left;  Hemi LAminectomy/Microdiscectomy Lumbar four  - Lumbar five  on the Left (X-Ray)  . TONSILLECTOMY      Family History  Problem Relation Age of Onset  . High blood pressure Mother   . Alzheimer's disease Father    Social History:  reports that he quit smoking about 8 years ago. He has a 30.00 pack-year smoking history. He has never used smokeless tobacco. He reports that he does not drink alcohol or use drugs.  Allergies:  Allergies  Allergen Reactions  . Methylpyrrolidone Hives    froze the intestine  . Niacin Other (See Comments)    Flushing, itching, tingling   . Ciprofloxacin Other (See Comments)    Felt flushed  . Oxybutynin Chloride [Oxybutynin Chloride Er] Other (See Comments)    froze the intestine  . Vesicare  [Solifenacin Succinate] Other (See Comments)    Froze the intestine     Medications Prior to Admission  Medication Sig Dispense Refill  . albuterol (PROVENTIL HFA;VENTOLIN HFA) 108 (90 BASE) MCG/ACT inhaler Inhale 2 puffs into the lungs every 6 (six) hours as needed for wheezing or shortness of breath.     . budesonide-formoterol (SYMBICORT) 80-4.5 MCG/ACT inhaler Inhale 2 puffs into the lungs 2 (two) times daily. (Patient taking differently: Inhale 2 puffs into the lungs 2 (two) times daily as needed (SOB, wheezing). ) 1 Inhaler 11  . buPROPion (WELLBUTRIN SR) 150 MG 12 hr tablet Take 150 mg by mouth every morning.     . cholecalciferol (VITAMIN D) 1000 units tablet Take 1,000 Units by mouth daily.    [name] [facility] 5 MG TABS tablet Take 5 mg by mouth 2 (two) times daily.    . hydrALAZINE (APRESOLINE) 50 MG tablet Take 50 mg by mouth 3 (three) times daily.    Lorayne Bender SUSTENNA 156 MG/ML SUSP injection Inject 156 mg as directed every 30 (thirty) days.    . montelukast (SINGULAIR) 10 MG tablet Take 10 mg by mouth at bedtime.    . ondansetron (ZOFRAN ODT) 4 MG disintegrating tablet Take 1 tablet (4 mg total) by mouth every 8 (eight) hours as needed for nausea or vomiting. 10 tablet 0  . oxyCODONE-acetaminophen (PERCOCET/ROXICET) 5-325 MG tablet Take 1-2 tablets by mouth every 6 (six) hours as needed for  severe pain. 10 tablet 0  . ranitidine (ZANTAC) 150 MG tablet Take 150 mg by mouth 2 (two) times daily.    . risperiDONE (RISPERDAL) 1 MG tablet Take 1 mg by mouth at bedtime.    . tamsulosin (FLOMAX) 0.4 MG CAPS capsule Take 1 capsule (0.4 mg total) by mouth daily. 5 capsule 0    Results for orders placed or performed during the hospital encounter of 09/16/16 (from the past 48 hour(s))  Hemoglobin and hematocrit, blood     Status: None   Collection Time: 09/16/16 11:47 AM  Result Value Ref Range   Hemoglobin 13.4 13.0 - 17.0 g/dL   HCT 42.4 39.0 - 52.0 %  MRSA PCR Screening     Status: None    Collection Time: 09/16/16  3:06 PM  Result Value Ref Range   MRSA by PCR NEGATIVE NEGATIVE    Comment:        The GeneXpert MRSA Assay (FDA approved for NASAL specimens only), is one component of a comprehensive MRSA colonization surveillance program. It is not intended to diagnose MRSA infection nor to guide or monitor treatment for MRSA infections.   Hemoglobin and hematocrit, blood     Status: Abnormal   Collection Time: 09/16/16  6:06 PM  Result Value Ref Range   Hemoglobin 11.5 (L) 13.0 - 17.0 g/dL   HCT 36.2 (L) 39.0 - 52.0 %  Hemoglobin and hematocrit, blood     Status: Abnormal   Collection Time: 09/17/16  1:51 AM  Result Value Ref Range   Hemoglobin 11.2 (L) 13.0 - 17.0 g/dL   HCT 34.5 (L) 39.0 - 52.0 %  CBC with Differential/Platelet     Status: Abnormal   Collection Time: 09/17/16  1:51 AM  Result Value Ref Range   WBC 6.8 4.0 - 10.5 K/uL   RBC 4.00 (L) 4.22 - 5.81 MIL/uL   Hemoglobin 11.2 (L) 13.0 - 17.0 g/dL   HCT 34.8 (L) 39.0 - 52.0 %   MCV 87.0 78.0 - 100.0 fL   MCH 28.0 26.0 - 34.0 pg   MCHC 32.2 30.0 - 36.0 g/dL   RDW 12.9 11.5 - 15.5 %   Platelets 195 150 - 400 K/uL   Neutrophils Relative % 58 %   Neutro Abs 4.0 1.7 - 7.7 K/uL   Lymphocytes Relative 26 %   Lymphs Abs 1.8 0.7 - 4.0 K/uL   Monocytes Relative 11 %   Monocytes Absolute 0.8 0.1 - 1.0 K/uL   Eosinophils Relative 5 %   Eosinophils Absolute 0.3 0.0 - 0.7 K/uL   Basophils Relative 0 %   Basophils Absolute 0.0 0.0 - 0.1 K/uL  Basic metabolic panel     Status: Abnormal   Collection Time: 09/17/16  1:51 AM  Result Value Ref Range   Sodium 135 135 - 145 mmol/L   Potassium 3.8 3.5 - 5.1 mmol/L    Comment: DELTA CHECK NOTED   Chloride 102 101 - 111 mmol/L   CO2 25 22 - 32 mmol/L   Glucose, Bld 103 (H) 65 - 99 mg/dL   BUN 14 6 - 20 mg/dL   Creatinine, Ser 1.87 (H) 0.61 - 1.24 mg/dL   Calcium 8.5 (L) 8.9 - 10.3 mg/dL   GFR calc non Af Amer 38 (L) >60 mL/min   GFR calc Af Amer 44 (L) >60  mL/min    Comment: (NOTE) The eGFR has been calculated using the CKD EPI equation. This calculation has not been validated in all  clinical situations. eGFR's persistently <60 mL/min signify possible Chronic Kidney Disease.    Anion gap 8 5 - 15  Glucose, capillary     Status: None   Collection Time: 09/17/16  8:47 AM  Result Value Ref Range   Glucose-Capillary 97 65 - 99 mg/dL   Comment 1 Notify RN   Hemoglobin and hematocrit, blood     Status: Abnormal   Collection Time: 09/17/16 10:50 AM  Result Value Ref Range   Hemoglobin 10.8 (L) 13.0 - 17.0 g/dL   HCT 33.3 (L) 39.0 - 52.0 %  Hemoglobin and hematocrit, blood     Status: Abnormal   Collection Time: 09/17/16  5:49 PM  Result Value Ref Range   Hemoglobin 10.9 (L) 13.0 - 17.0 g/dL   HCT 33.5 (L) 39.0 - 52.0 %  CBC with Differential/Platelet     Status: Abnormal   Collection Time: 09/18/16  2:27 AM  Result Value Ref Range   WBC 7.6 4.0 - 10.5 K/uL   RBC 3.87 (L) 4.22 - 5.81 MIL/uL   Hemoglobin 10.9 (L) 13.0 - 17.0 g/dL   HCT 33.6 (L) 39.0 - 52.0 %   MCV 86.8 78.0 - 100.0 fL   MCH 28.2 26.0 - 34.0 pg   MCHC 32.4 30.0 - 36.0 g/dL   RDW 12.9 11.5 - 15.5 %   Platelets 208 150 - 400 K/uL   Neutrophils Relative % 61 %   Neutro Abs 4.6 1.7 - 7.7 K/uL   Lymphocytes Relative 25 %   Lymphs Abs 1.9 0.7 - 4.0 K/uL   Monocytes Relative 8 %   Monocytes Absolute 0.6 0.1 - 1.0 K/uL   Eosinophils Relative 6 %   Eosinophils Absolute 0.5 0.0 - 0.7 K/uL   Basophils Relative 0 %   Basophils Absolute 0.0 0.0 - 0.1 K/uL  Basic metabolic panel     Status: Abnormal   Collection Time: 09/18/16  2:27 AM  Result Value Ref Range   Sodium 136 135 - 145 mmol/L   Potassium 3.9 3.5 - 5.1 mmol/L   Chloride 102 101 - 111 mmol/L   CO2 27 22 - 32 mmol/L   Glucose, Bld 99 65 - 99 mg/dL   BUN 11 6 - 20 mg/dL   Creatinine, Ser 1.77 (H) 0.61 - 1.24 mg/dL   Calcium 8.8 (L) 8.9 - 10.3 mg/dL   GFR calc non Af Amer 41 (L) >60 mL/min   GFR calc Af Amer 47  (L) >60 mL/min    Comment: (NOTE) The eGFR has been calculated using the CKD EPI equation. This calculation has not been validated in all clinical situations. eGFR's persistently <60 mL/min signify possible Chronic Kidney Disease.    Anion gap 7 5 - 15  Glucose, capillary     Status: None   Collection Time: 09/18/16  7:57 AM  Result Value Ref Range   Glucose-Capillary 96 65 - 99 mg/dL   Comment 1 Notify RN    US Abdomen Limited Ruq  Result Date: 09/16/2016 CLINICAL DATA:  Vomiting and nausea. EXAM: US ABDOMEN LIMITED - RIGHT UPPER QUADRANT COMPARISON:  CT scan June 17, 2016 FINDINGS: Gallbladder: No gallstones or wall thickening visualized. No sonographic Murphy sign noted by sonographer. Common bile duct: Diameter: 3.2 mm. The common bile duct was difficult to visualize well however. Liver: The left hepatic lobe was not seen due to shadowing bowel gas. Other: The patient's known prominent right renal pelvis is identified, measuring up to 6.2 cm. IMPRESSION: 1. Limited  study. No abnormality identified in the gallbladder, common bile duct, or liver. 2. Large cystic mass in the right kidney appears to correlate with the dilated right renal pelvis seen in February of 2018. Electronically Signed   By: Dorise Bullion III M.D   On: 09/16/2016 13:54    ROS  Blood pressure (!) 188/98, pulse 73, temperature 99 F (37.2 C), temperature source Oral, resp. rate 16, height 6' (1.829 m), weight 102.1 kg (225 lb), SpO2 96 %. Physical Exam   Assessment/Plan Coffee-ground emesis Plan EGD today.  Ronnette Juniper, MD 09/18/2016, 10:13 AM

## 2016-09-18 NOTE — H&P (View-Only) (Signed)
[facility] Progress Note  Peter [facility] 56 y.o. 1961-01-12  CC:  GI bleed, nausea, vomiting   Subjective: No further episodes of bleeding. No bowel movement today. Nausea and vomiting improving. Tolerating diet.  ROS : Denied chest pain or shortness of breath.   Objective: Vital signs in last 24 hours: Vitals:   09/17/16 0309 09/17/16 0817  BP: (!) 152/84 (!) 142/94  Pulse: (!) 104   Resp: (!) 21   Temp: 98.2 F (36.8 C) 98.4 F (36.9 C)    Physical Exam:  General:   Alert,  Well-developed, well-nourished, pleasant and cooperative in NAD HEENT : Extraocular movement intact. Normocephalic/atraumatic. No oral lesions. No scleral icterus. Lungs:  Clear throughout to auscultation.   No wheezes, crackles, or rhonchi. No acute distress. Heart:  Regular rate and rhythm; no murmurs, clicks, rubs,  or gallops. Abdomen: Mildly distended without any tenderness. No peritoneal signs. Bowel sounds present. Not able to palpate hepatosplenomegaly. Psych : Mood and affect normal. Alert and oriented 3 LE: no edema . Pulses present   Lab Results:  Recent Labs  09/16/16 0339 09/16/16 0353 09/17/16 0151  NA 135 137 135  K 3.1* 3.1* 3.8  CL 97* 95* 102  CO2 27  --  25  GLUCOSE 135* 136* 103*  BUN 16 18 14   CREATININE 1.99* 2.10* 1.87*  CALCIUM 8.8*  --  8.5*    Recent Labs  09/16/16 0339  AST 16  ALT 11*  ALKPHOS 76  BILITOT 0.7  PROT 6.2*  ALBUMIN 3.5    Recent Labs  09/16/16 0339  09/16/16 1806 09/17/16 0151  WBC 10.8*  --   --  6.8  NEUTROABS 7.6  --   --  4.0  HGB 13.5  < > 11.5* 11.2*  11.2*  HCT 41.6  < > 36.2* 34.8*  34.5*  MCV 85.6  --   --  87.0  PLT 223  --   --  195  < > = values in this interval not displayed. No results for input(s): LABPROT, INR in the last 72 hours.    Assessment/Plan:  Coffee-ground emesis With some drop in hemoglobin. Normal BUN despite of having chronic kidney disease.  - Acute on chronic nausea and  vomiting. ?? Gastroparesis. / ?? Multifactorial - Chronic pancreatitis based on CT scan of February 2018. - Paroxysmal atrial fibrillation. On Eliquis .  Last dose 09/15/2016  - History of schizophrenia  Recommendations ------------------------- - Patient with small amount of coffee-ground emesis  with recurrent nausea and vomiting. Drop in hemoglobin noted , part of it could be dilutional effect. Continue IV twice a day PPI. Ultrasound right upper quadrant essentially normal.  - EGD tomorrow for further evaluation. Last dose of Eliquis  on 09/15/2016. Risk benefits alternatives discussed with the patient. Verbalized understanding.  - Consider repeating gastric emptying study for recurrent nausea and vomiting as he had a borderline gastric emptying  in 2010. -  GI will follow   Otis Brace MD, FACP 09/17/2016, 10:44 AM  Pager [phone]  If no answer or after 5 PM call [phone]

## 2016-09-18 NOTE — Op Note (Signed)
EGD showed multiple esophageal ulcers starting from 25 cm to 35 cm from insertion. Biopsies have been taken from esophageal ulcers as well as antrum to rule out H. Pylori. Recommend Protonix 40 mg twice a day for the next 8 weeks. Recommend sucralfate 1 tab by mouth twice a day for the next 2 weeks. Advised repeat endoscopy in 8 weeks to check for healing. We will follow up pathology as an outpatient. Ok to restart Eliquis in am. Will start the patient on mechanical soft regular diet.  Ronnette Juniper, MD [phone]

## 2016-09-19 ENCOUNTER — Encounter (HOSPITAL_COMMUNITY): Payer: Self-pay | Admitting: Gastroenterology

## 2016-09-19 LAB — CBC WITH DIFFERENTIAL/PLATELET
BASOS ABS: 0 10*3/uL (ref 0.0–0.1)
Basophils Relative: 0 %
EOS ABS: 0.3 10*3/uL (ref 0.0–0.7)
Eosinophils Relative: 5 %
HEMATOCRIT: 33.1 % — AB (ref 39.0–52.0)
HEMOGLOBIN: 10.5 g/dL — AB (ref 13.0–17.0)
Lymphocytes Relative: 20 %
Lymphs Abs: 1.4 10*3/uL (ref 0.7–4.0)
MCH: 27.3 pg (ref 26.0–34.0)
MCHC: 31.7 g/dL (ref 30.0–36.0)
MCV: 86 fL (ref 78.0–100.0)
MONO ABS: 0.7 10*3/uL (ref 0.1–1.0)
Monocytes Relative: 10 %
NEUTROS ABS: 4.6 10*3/uL (ref 1.7–7.7)
NEUTROS PCT: 65 %
Platelets: 220 10*3/uL (ref 150–400)
RBC: 3.85 MIL/uL — ABNORMAL LOW (ref 4.22–5.81)
RDW: 12.6 % (ref 11.5–15.5)
WBC: 7 10*3/uL (ref 4.0–10.5)

## 2016-09-19 LAB — BASIC METABOLIC PANEL
ANION GAP: 6 (ref 5–15)
BUN: 11 mg/dL (ref 6–20)
CALCIUM: 8.5 mg/dL — AB (ref 8.9–10.3)
CO2: 24 mmol/L (ref 22–32)
CREATININE: 1.68 mg/dL — AB (ref 0.61–1.24)
Chloride: 106 mmol/L (ref 101–111)
GFR, EST AFRICAN AMERICAN: 51 mL/min — AB (ref >60)
GFR, EST NON AFRICAN AMERICAN: 44 mL/min — AB (ref >60)
Glucose, Bld: 108 mg/dL — ABNORMAL HIGH (ref 65–99)
Potassium: 3.9 mmol/L (ref 3.5–5.1)
Sodium: 136 mmol/L (ref 135–145)

## 2016-09-19 LAB — GLUCOSE, CAPILLARY: Glucose-Capillary: 112 mg/dL — ABNORMAL HIGH (ref 65–99)

## 2016-09-19 MED ORDER — SUCRALFATE 1 G PO TABS: 1.0000 g | ORAL_TABLET | Freq: Two times a day (BID) | ORAL | 0 refills | Status: DC

## 2016-09-19 MED ORDER — PANTOPRAZOLE SODIUM 40 MG PO TBEC
40.0000 mg | DELAYED_RELEASE_TABLET | Freq: Two times a day (BID) | ORAL | Status: DC
  Administered 2016-09-19: 40 mg via ORAL
  Filled 2016-09-19: qty 1

## 2016-09-19 MED ORDER — PANTOPRAZOLE SODIUM 40 MG PO TBEC: 40.0000 mg | DELAYED_RELEASE_TABLET | Freq: Two times a day (BID) | ORAL | 0 refills | Status: DC

## 2016-09-19 MED ORDER — METOPROLOL TARTRATE 25 MG PO TABS: 25.0000 mg | ORAL_TABLET | Freq: Two times a day (BID) | ORAL | 0 refills | Status: DC

## 2016-09-19 MED ORDER — APIXABAN 5 MG PO TABS
5.0000 mg | ORAL_TABLET | Freq: Two times a day (BID) | ORAL | Status: DC
  Administered 2016-09-19: 5 mg via ORAL
  Filled 2016-09-19: qty 1

## 2016-09-19 NOTE — Discharge Instructions (Signed)
Comply with medications and followup

## 2016-09-19 NOTE — Care Management Note (Signed)
Case Management Note  Patient Details  Name: Peter [facility] MRN: [id number] Date of Birth: 1960/08/09  Subjective/Objective:       CM following for progression and d/c planning.              Action/Plan: 09/19/2016 No d/c needs identified.   Expected Discharge Date:  09/19/16               Expected Discharge Plan:  Home/Self Care  In-House Referral:     Discharge planning Services  CM Consult  Post Acute Care Choice:  NA Choice offered to:  NA  DME Arranged:    DME Agency:     HH Arranged:    HH Agency:  NA  Status of Service:  Completed, signed off  If discussed at [place] of Stay Meetings, dates discussed:    Additional Comments:  Adron Bene, RN 09/19/2016, 2:33 PM

## 2016-09-19 NOTE — Discharge Summary (Signed)
Pt given discharge instructions, prescriptions, and follow up info. Denies questions. Wife to drive pt home.

## 2016-09-19 NOTE — Discharge Summary (Signed)
Physician Discharge Summary  Peter Marsh WGN:[id number] DOB: 04-22-1961 DOA: 09/16/2016  PCP: Marda Stalker, PA-C  Admit date: 09/16/2016 Discharge date: 09/19/2016  Admitted From: Home Disposition:  Home  Recommendations for Outpatient Follow-up:  1. Follow up with PCP in 1-2 weeks 2. Please obtain BMP/CBC in one week 3. Follow-up with Dr. Therisa Doyne in 8 weeks' time  4. Resume Eliquis  Home Health: No Equipment/Devices: None  Discharge Condition: Stable  CODE STATUS: Full Diet recommendation: Heart Healthy   Brief/Interim Summary: 56 y.o.malewith medical history significant for schizophrenia, chronic kidney disease stage 3-4, asthma, paroxysmal atrial flutter on Eliquis, hypertension, GERD, remote history of peptic ulcer disease, presented to the emergency department for evaluation of abdominal pain, nausea, vomiting, and coffee ground emesis. He was started on intravenous Protonix. He had EGD done yesterday and was found to have multiple esophageal ulcers. He was placed on oral Protonix and oral sucralfate. Eliquis has been resumed from this morning as per GI recommendations.  Discharge Diagnoses:  Principal Problem:   Coffee ground emesis Active Problems:   Cough variant asthma   CKD (chronic kidney disease), stage III   Schizophrenia ([name])   Paroxysmal atrial fibrillation ([name])   Upper GI bleed  1. Acute blood loss anemia due to Probable Upper GI bleed from esophageal ulcers: - No further episodes of hematemesis. Hemoglobin stable.  - EGD showed multiple esophageal ulcers.  - Continue with Protonix 40 mg by mouth twice a day for 8 weeksand sucralfate 1 tab by mouth twice a day for 2 weeks. Restart Eliquis as per GI recommendations. Follow up with GI in 8 weeks' time for need for repeat EGD. - Discharge patient home  2. Paroxysmal atrial flutter  - Heart rate better controlled with metoprolol. Continue with metoprolol -Restart Eliquis  3. CKD stage III  -  Stable - Monitor creatinine as an outpatient -  4. Asthma  - Mild intermittent, stable   5. Schizophrenia  - Appears to be stable  - Continue Risperdal, Wellbutrin   6. Hypertension - Continue metoprolol  7. Hypokalemia  - Improved  Discharge Instructions  Discharge Instructions    Call MD for:  extreme fatigue    Complete by:  As directed    Call MD for:  persistant dizziness or light-headedness    Complete by:  As directed    Call MD for:  persistant nausea and vomiting    Complete by:  As directed    Call MD for:  severe uncontrolled pain    Complete by:  As directed    Call MD for:  temperature >100.4    Complete by:  As directed    Diet - low sodium heart healthy    Complete by:  As directed    Increase activity slowly    Complete by:  As directed      Allergies as of 09/19/2016      Reactions   Methylpyrrolidone Hives   froze the intestine   Niacin Other (See Comments)   Flushing, itching, tingling    Ciprofloxacin Other (See Comments)   Felt flushed   Oxybutynin Chloride [oxybutynin Chloride Er] Other (See Comments)   froze the intestine   Vesicare [solifenacin Succinate] Other (See Comments)   Froze the intestine      Medication List    STOP taking these medications   ranitidine 150 MG tablet Commonly known as:  ZANTAC     TAKE these medications   albuterol 108 (90 Base) MCG/ACT inhaler Commonly  known as:  PROVENTIL HFA;VENTOLIN HFA Inhale 2 puffs into the lungs every 6 (six) hours as needed for wheezing or shortness of breath.   budesonide-formoterol 80-4.5 MCG/ACT inhaler Commonly known as:  SYMBICORT Inhale 2 puffs into the lungs 2 (two) times daily. What changed:  when to take this  reasons to take this   buPROPion 150 MG 12 hr tablet Commonly known as:  WELLBUTRIN SR Take 150 mg by mouth every morning.   cholecalciferol 1000 units tablet Commonly known as:  VITAMIN D Take 1,000 Units by mouth daily.   ELIQUIS 5 MG Tabs  tablet Generic drug:  apixaban Take 5 mg by mouth 2 (two) times daily.   hydrALAZINE 50 MG tablet Commonly known as:  APRESOLINE Take 50 mg by mouth 3 (three) times daily.   INVEGA SUSTENNA 156 MG/ML Susp injection Generic drug:  paliperidone Inject 156 mg as directed every 30 (thirty) days.   metoprolol tartrate 25 MG tablet Commonly known as:  LOPRESSOR Take 1 tablet (25 mg total) by mouth 2 (two) times daily.   montelukast 10 MG tablet Commonly known as:  SINGULAIR Take 10 mg by mouth at bedtime.   ondansetron 4 MG disintegrating tablet Commonly known as:  ZOFRAN ODT Take 1 tablet (4 mg total) by mouth every 8 (eight) hours as needed for nausea or vomiting.   oxyCODONE-acetaminophen 5-325 MG tablet Commonly known as:  PERCOCET/ROXICET Take 1-2 tablets by mouth every 6 (six) hours as needed for severe pain.   pantoprazole 40 MG tablet Commonly known as:  PROTONIX Take 1 tablet (40 mg total) by mouth 2 (two) times daily.   risperiDONE 1 MG tablet Commonly known as:  RISPERDAL Take 1 mg by mouth at bedtime.   sucralfate 1 g tablet Commonly known as:  CARAFATE Take 1 tablet (1 g total) by mouth 2 (two) times daily.   tamsulosin 0.4 MG Caps capsule Commonly known as:  FLOMAX Take 1 capsule (0.4 mg total) by mouth daily.      Follow-up Information    Marda Stalker, PA-C Follow up in 1 week(s).   Specialty:  Family Medicine Contact information: [place] [address] [phone]        Ronnette Juniper, MD Follow up in 8 week(s).   Specialty:  Gastroenterology Contact information: [address] [place] [address] [phone]          Allergies  Allergen Reactions  . Methylpyrrolidone Hives    froze the intestine  . Niacin Other (See Comments)    Flushing, itching, tingling   . Ciprofloxacin Other (See Comments)    Felt flushed  . Oxybutynin Chloride [Oxybutynin Chloride Er] Other (See Comments)    froze the  intestine  . Vesicare [Solifenacin Succinate] Other (See Comments)    Froze the intestine     Consultations:  GI   Procedures/Studies: US Abdomen Limited Ruq  Result Date: 09/16/2016 CLINICAL DATA:  Vomiting and nausea. EXAM: US ABDOMEN LIMITED - RIGHT UPPER QUADRANT COMPARISON:  CT scan June 17, 2016 FINDINGS: Gallbladder: No gallstones or wall thickening visualized. No sonographic Murphy sign noted by sonographer. Common bile duct: Diameter: 3.2 mm. The common bile duct was difficult to visualize well however. Liver: The left hepatic lobe was not seen due to shadowing bowel gas. Other: The patient's known prominent right renal pelvis is identified, measuring up to 6.2 cm. IMPRESSION: 1. Limited study. No abnormality identified in the gallbladder, common bile duct, or liver. 2. Large  cystic mass in the right kidney appears to correlate with the dilated right renal pelvis seen in February of 2018. Electronically Signed   By: Dorise Bullion III M.D   On: 09/16/2016 13:54    EGD 09/18/16: Multiple esophageal ulcers; biopsy pending   Subjective: Patient seen and examined at bedside. He denies any overnight fever, nausea, vomiting. He wants to go home.  Discharge Exam: Vitals:   09/19/16 0900 09/19/16 0934  BP: (!) 155/83 (!) 155/83  Pulse: 96 96  Resp:    Temp: 97.8 F (36.6 C)    Vitals:   09/19/16 0427 09/19/16 0717 09/19/16 0900 09/19/16 0934  BP: (!) 152/90  (!) 155/83 (!) 155/83  Pulse: 72  96 96  Resp: 18     Temp: 98.3 F (36.8 C)  97.8 F (36.6 C)   TempSrc: Oral  Oral   SpO2: 97% 96% 99%   Weight:      Height:        General: Pt is alert, awake, not in acute distress Cardiovascular: Rate controlled, S1/S2 +, Respiratory: Bilaterally decreased breath sounds at bases  Abdominal: Soft, NT, ND, bowel sounds + Extremities: no edema, no cyanosis    The results of significant diagnostics from this hospitalization (including imaging, microbiology, ancillary and  laboratory) are listed below for reference.     Microbiology: Recent Results (from the past 240 hour(s))  MRSA PCR Screening     Status: None   Collection Time: 09/16/16  3:06 PM  Result Value Ref Range Status   MRSA by PCR NEGATIVE NEGATIVE Final    Comment:        The GeneXpert MRSA Assay (FDA approved for NASAL specimens only), is one component of a comprehensive MRSA colonization surveillance program. It is not intended to diagnose MRSA infection nor to guide or monitor treatment for MRSA infections.      Labs: BNP (last 3 results) No results for input(s): BNP in the last 8760 hours. Basic Metabolic Panel:  Recent Labs Lab 09/16/16 0339 09/16/16 0353 09/17/16 0151 09/18/16 0227 09/19/16 0620  NA 135 137 135 136 136  K 3.1* 3.1* 3.8 3.9 3.9  CL 97* 95* 102 102 106  CO2 27  --  25 27 24   GLUCOSE 135* 136* 103* 99 108*  BUN 16 18 14 11 11   CREATININE 1.99* 2.10* 1.87* 1.77* 1.68*  CALCIUM 8.8*  --  8.5* 8.8* 8.5*   Liver Function Tests:  Recent Labs Lab 09/16/16 0339  AST 16  ALT 11*  ALKPHOS 76  BILITOT 0.7  PROT 6.2*  ALBUMIN 3.5    Recent Labs Lab 09/16/16 0339  LIPASE 17   No results for input(s): AMMONIA in the last 168 hours. CBC:  Recent Labs Lab 09/16/16 0339  09/17/16 0151 09/17/16 1050 09/17/16 1749 09/18/16 0227 09/19/16 0620  WBC 10.8*  --  6.8  --   --  7.6 7.0  NEUTROABS 7.6  --  4.0  --   --  4.6 4.6  HGB 13.5  < > 11.2*  11.2* 10.8* 10.9* 10.9* 10.5*  HCT 41.6  < > 34.8*  34.5* 33.3* 33.5* 33.6* 33.1*  MCV 85.6  --  87.0  --   --  86.8 86.0  PLT 223  --  195  --   --  208 220  < > = values in this interval not displayed. Cardiac Enzymes: No results for input(s): CKTOTAL, CKMB, CKMBINDEX, TROPONINI in the last 168 hours. BNP: Invalid input(s):  POCBNP CBG:  Recent Labs Lab 09/16/16 0830 09/17/16 0847 09/18/16 0757 09/19/16 0740  GLUCAP 124* 97 96 112*   D-Dimer No results for input(s): DDIMER in the last 72  hours. Hgb A1c No results for input(s): HGBA1C in the last 72 hours. Lipid Profile No results for input(s): CHOL, HDL, LDLCALC, TRIG, CHOLHDL, LDLDIRECT in the last 72 hours. Thyroid function studies No results for input(s): TSH, T4TOTAL, T3FREE, THYROIDAB in the last 72 hours.  Invalid input(s): FREET3 Anemia work up No results for input(s): VITAMINB12, FOLATE, FERRITIN, TIBC, IRON, RETICCTPCT in the last 72 hours. Urinalysis    Component Value Date/Time   COLORURINE RED (A) 06/16/2016 2130   APPEARANCEUR TURBID (A) 06/16/2016 2130   LABSPEC  06/16/2016 2130    TEST NOT REPORTED DUE TO COLOR INTERFERENCE OF URINE PIGMENT   PHURINE  06/16/2016 2130    TEST NOT REPORTED DUE TO COLOR INTERFERENCE OF URINE PIGMENT   GLUCOSEU (A) 06/16/2016 2130    TEST NOT REPORTED DUE TO COLOR INTERFERENCE OF URINE PIGMENT   HGBUR (A) 06/16/2016 2130    TEST NOT REPORTED DUE TO COLOR INTERFERENCE OF URINE PIGMENT   BILIRUBINUR (A) 06/16/2016 2130    TEST NOT REPORTED DUE TO COLOR INTERFERENCE OF URINE PIGMENT   KETONESUR (A) 06/16/2016 2130    TEST NOT REPORTED DUE TO COLOR INTERFERENCE OF URINE PIGMENT   PROTEINUR (A) 06/16/2016 2130    TEST NOT REPORTED DUE TO COLOR INTERFERENCE OF URINE PIGMENT   UROBILINOGEN 0.2 04/17/2011 1030   NITRITE (A) 06/16/2016 2130    TEST NOT REPORTED DUE TO COLOR INTERFERENCE OF URINE PIGMENT   LEUKOCYTESUR (A) 06/16/2016 2130    TEST NOT REPORTED DUE TO COLOR INTERFERENCE OF URINE PIGMENT   Sepsis Labs Invalid input(s): PROCALCITONIN,  WBC,  LACTICIDVEN Microbiology Recent Results (from the past 240 hour(s))  MRSA PCR Screening     Status: None   Collection Time: 09/16/16  3:06 PM  Result Value Ref Range Status   MRSA by PCR NEGATIVE NEGATIVE Final    Comment:        The GeneXpert MRSA Assay (FDA approved for NASAL specimens only), is one component of a comprehensive MRSA colonization surveillance program. It is not intended to diagnose MRSA infection  nor to guide or monitor treatment for MRSA infections.      Time coordinating discharge: 35 minutes  SIGNED:   Aline August, MD  [facility] 09/19/2016, 10:46 AM Pager: [phone]  If 7PM-7AM, please contact night-coverage [email or web address] Password [email or web address]

## 2016-09-25 DIAGNOSIS — F200 Paranoid schizophrenia: Secondary | ICD-10-CM | POA: Diagnosis not present

## 2016-09-28 DIAGNOSIS — R251 Tremor, unspecified: Secondary | ICD-10-CM | POA: Diagnosis not present

## 2016-09-28 DIAGNOSIS — E538 Deficiency of other specified B group vitamins: Secondary | ICD-10-CM | POA: Diagnosis not present

## 2016-09-28 DIAGNOSIS — I129 Hypertensive chronic kidney disease with stage 1 through stage 4 chronic kidney disease, or unspecified chronic kidney disease: Secondary | ICD-10-CM | POA: Diagnosis not present

## 2016-09-28 DIAGNOSIS — K922 Gastrointestinal hemorrhage, unspecified: Secondary | ICD-10-CM | POA: Diagnosis not present

## 2016-09-28 DIAGNOSIS — I10 Essential (primary) hypertension: Secondary | ICD-10-CM | POA: Diagnosis not present

## 2016-10-23 DIAGNOSIS — F200 Paranoid schizophrenia: Secondary | ICD-10-CM | POA: Diagnosis not present

## 2016-11-17 NOTE — Addendum Note (Signed)
Addendum  created 11/17/16 1712 by Lillia Abed, MD   Sign clinical note

## 2016-11-17 NOTE — Anesthesia Postprocedure Evaluation (Signed)
Anesthesia Post Note  Patient: Peter Marsh  Procedure(s) Performed: Procedure(s) (LRB): ESOPHAGOGASTRODUODENOSCOPY (EGD) WITH PROPOFOL (N/A)     Anesthesia Post Evaluation  Last Vitals:  Vitals:   09/19/16 0900 09/19/16 0934  BP: (!) 155/83 (!) 155/83  Pulse: 96 96  Resp:    Temp: 36.6 C     Last Pain:  Vitals:   09/19/16 0900  TempSrc: Oral  PainSc:                  Chlora Mcbain DAVID

## 2016-11-27 DIAGNOSIS — F200 Paranoid schizophrenia: Secondary | ICD-10-CM | POA: Diagnosis not present

## 2016-12-15 DIAGNOSIS — F250 Schizoaffective disorder, bipolar type: Secondary | ICD-10-CM | POA: Diagnosis not present

## 2016-12-28 DIAGNOSIS — N184 Chronic kidney disease, stage 4 (severe): Secondary | ICD-10-CM | POA: Diagnosis not present

## 2017-01-10 DIAGNOSIS — Z6832 Body mass index (BMI) 32.0-32.9, adult: Secondary | ICD-10-CM | POA: Diagnosis not present

## 2017-01-10 DIAGNOSIS — N184 Chronic kidney disease, stage 4 (severe): Secondary | ICD-10-CM | POA: Diagnosis not present

## 2017-01-10 DIAGNOSIS — F209 Schizophrenia, unspecified: Secondary | ICD-10-CM | POA: Diagnosis not present

## 2017-01-10 DIAGNOSIS — N1330 Unspecified hydronephrosis: Secondary | ICD-10-CM | POA: Diagnosis not present

## 2017-01-10 DIAGNOSIS — E559 Vitamin D deficiency, unspecified: Secondary | ICD-10-CM | POA: Diagnosis not present

## 2017-01-10 DIAGNOSIS — I10 Essential (primary) hypertension: Secondary | ICD-10-CM | POA: Diagnosis not present

## 2017-01-18 DIAGNOSIS — I4892 Unspecified atrial flutter: Secondary | ICD-10-CM | POA: Diagnosis not present

## 2017-01-18 DIAGNOSIS — I10 Essential (primary) hypertension: Secondary | ICD-10-CM | POA: Diagnosis not present

## 2017-01-18 DIAGNOSIS — K2270 Barrett's esophagus without dysplasia: Secondary | ICD-10-CM | POA: Diagnosis not present

## 2017-01-18 DIAGNOSIS — Z8719 Personal history of other diseases of the digestive system: Secondary | ICD-10-CM | POA: Diagnosis not present

## 2017-01-18 DIAGNOSIS — R710 Precipitous drop in hematocrit: Secondary | ICD-10-CM | POA: Diagnosis not present

## 2017-01-18 DIAGNOSIS — K861 Other chronic pancreatitis: Secondary | ICD-10-CM | POA: Diagnosis not present

## 2017-01-25 DIAGNOSIS — I471 Supraventricular tachycardia: Secondary | ICD-10-CM | POA: Diagnosis not present

## 2017-01-25 DIAGNOSIS — I130 Hypertensive heart and chronic kidney disease with heart failure and stage 1 through stage 4 chronic kidney disease, or unspecified chronic kidney disease: Secondary | ICD-10-CM | POA: Diagnosis not present

## 2017-01-25 DIAGNOSIS — N183 Chronic kidney disease, stage 3 (moderate): Secondary | ICD-10-CM | POA: Diagnosis not present

## 2017-01-25 DIAGNOSIS — I483 Typical atrial flutter: Secondary | ICD-10-CM | POA: Diagnosis not present

## 2017-01-25 DIAGNOSIS — R0602 Shortness of breath: Secondary | ICD-10-CM | POA: Diagnosis not present

## 2017-01-30 DIAGNOSIS — I10 Essential (primary) hypertension: Secondary | ICD-10-CM | POA: Diagnosis not present

## 2017-02-07 ENCOUNTER — Encounter: Payer: Self-pay | Admitting: Internal Medicine

## 2017-02-07 ENCOUNTER — Ambulatory Visit (INDEPENDENT_AMBULATORY_CARE_PROVIDER_SITE_OTHER): Payer: PPO | Admitting: Internal Medicine

## 2017-02-07 VITALS — BP 110/82 | HR 98 | Ht 72.0 in | Wt 233.4 lb

## 2017-02-07 DIAGNOSIS — I4892 Unspecified atrial flutter: Secondary | ICD-10-CM

## 2017-02-07 NOTE — Progress Notes (Signed)
HPI [name] is referred today by Dr. Einar Gip for evaluation of atrial flutter. He is a pleasant 56 yo man with preserved LV function, obesity, HTN, and h/o GI bleeding. He was found by Dr. Einar Gip to have atrial flutter. He has chronic LBBB but no syncope. He has been placed on systemic anti-coagulation with an [place]. He has not had angina and stress testing is unrevealing. It appears that he may have had atrial flutter around the time of stress testing. No other ECGs available other than those that demonstrate NSR with LBBB. He is referred for additional evaluation.  Allergies  Allergen Reactions  . Methylpyrrolidone Hives    froze the intestine  . Niacin Other (See Comments)    Flushing, itching, tingling   . Ciprofloxacin Other (See Comments)    Felt flushed  . Oxybutynin Chloride [Oxybutynin Chloride Er] Other (See Comments)    froze the intestine  . Vesicare [Solifenacin Succinate] Other (See Comments)    Froze the intestine      Current Outpatient Prescriptions  Medication Sig Dispense Refill  . albuterol (PROVENTIL HFA;VENTOLIN HFA) 108 (90 BASE) MCG/ACT inhaler Inhale 2 puffs into the lungs every 6 (six) hours as needed for wheezing or shortness of breath.     . budesonide-formoterol (SYMBICORT) 80-4.5 MCG/ACT inhaler Inhale 2 puffs into the lungs 2 (two) times daily. (Patient taking differently: Inhale 2 puffs into the lungs 2 (two) times daily as needed (SOB, wheezing). ) 1 Inhaler 11  . buPROPion (WELLBUTRIN SR) 150 MG 12 hr tablet Take 150 mg by mouth every morning.     . cholecalciferol (VITAMIN D) 1000 units tablet Take 1,000 Units by mouth daily.    [name] ELIQUIS 5 MG TABS tablet Take 5 mg by mouth 2 (two) times daily.    . hydrALAZINE (APRESOLINE) 50 MG tablet Take 50 mg by mouth 3 (three) times daily.    Lorayne Bender SUSTENNA 156 MG/ML SUSP injection Inject 156 mg as directed every 30 (thirty) days.    . metoprolol tartrate (LOPRESSOR) 25 MG tablet Take 1 tablet (25 mg  total) by mouth 2 (two) times daily. 60 tablet 0  . montelukast (SINGULAIR) 10 MG tablet Take 10 mg by mouth at bedtime.    . ondansetron (ZOFRAN ODT) 4 MG disintegrating tablet Take 1 tablet (4 mg total) by mouth every 8 (eight) hours as needed for nausea or vomiting. 10 tablet 0  . oxyCODONE-acetaminophen (PERCOCET/ROXICET) 5-325 MG tablet Take 1-2 tablets by mouth every 6 (six) hours as needed for severe pain. 10 tablet 0  . pantoprazole (PROTONIX) 40 MG tablet Take 1 tablet (40 mg total) by mouth 2 (two) times daily. 60 tablet 0  . risperiDONE (RISPERDAL) 1 MG tablet Take 1 mg by mouth at bedtime.    . sucralfate (CARAFATE) 1 g tablet Take 1 tablet (1 g total) by mouth 2 (two) times daily. 30 tablet 0  . tamsulosin (FLOMAX) 0.4 MG CAPS capsule Take 1 capsule (0.4 mg total) by mouth daily. 5 capsule 0   No current facility-administered medications for this visit.    Facility-Administered Medications Ordered in Other Visits  Medication Dose Route Frequency Provider Last Rate Last Dose  . acetaminophen (OFIRMEV) IVPB    PRN Lissa Morales, CRNA   1,000 mg at 04/19/11 0910  . ePHEDrine injection    PRN Lissa Morales, CRNA   5 mg at 04/19/11 1018  . glycopyrrolate (ROBINUL) injection    PRN Enrigue Catena  E, CRNA   0.8 mg at 04/19/11 1037  . SUFentanil (SUFENTA) injection    PRN Lissa Morales, CRNA   10 mcg at 04/19/11 1008     Past Medical History:  Diagnosis Date  . Asthma    uses inhalers   . Bilateral carotid bruits   . Chronic kidney disease    bladder interstial cystitis   . Chronic kidney disease (CKD), stage IV (severe) ([name])    follow[facility] by Dr. Joelyn [facility]  . Depression   . GERD (gastroesophageal reflux disease)   . History of stomach ulcers 2001  . Hypertension   . LBBB (left bundle branch block)   . Lower extremity edema   . Mild intermittent asthma without complication   . Mixed hyperlipidemia   . Paroxysmal atrial flutter ([place])   . PONV (postoperative  nausea and vomiting)   . Schizophrenia ([place])     ROS:   All systems reviewed and negative except as noted in the HPI.   Past Surgical History:  Procedure Laterality Date  . APPENDECTOMY    . ESOPHAGOGASTRODUODENOSCOPY (EGD) WITH PROPOFOL N/A 09/18/2016   Procedure: ESOPHAGOGASTRODUODENOSCOPY (EGD) WITH PROPOFOL;  Surgeon: Ronnette Juniper, MD;  Location: [facility];  Service: Gastroenterology;  Laterality: N/A;  . LUMBAR LAMINECTOMY/DECOMPRESSION MICRODISCECTOMY  04/19/2011   Procedure: LUMBAR LAMINECTOMY/DECOMPRESSION MICRODISCECTOMY;  Surgeon: Tobi Bastos;  Location: [facility];  Service: Orthopedics;  Laterality: Left;  Hemi LAminectomy/Microdiscectomy Lumbar four  - Lumbar five  on the Left (X-Ray)  . TONSILLECTOMY       Family History  Problem Relation Age of Onset  . High blood pressure Mother   . Alzheimer's disease Father      Social History   Social History  . Marital status: Married    Spouse name: N/A  . Number of children: N/A  . Years of education: N/A   Occupational History  . Not on file.   Social History Main Topics  . Smoking status: Former Smoker    Packs/day: 1.00    Years: 30.00    Quit date: 05/08/2008  . Smokeless tobacco: Never Used  . Alcohol use No  . Drug use: No  . Sexual activity: No   Other Topics Concern  . Not on file   Social History Narrative  . No narrative on file     BP 110/82   Pulse 98   Ht 6' (1.829 m)   Wt 233 lb 6.4 oz (105.9 kg)   SpO2 97%   BMI 31.65 kg/m   Physical Exam:  Well appearing NAD HEENT: Unremarkable Neck:  No JVD, no thyromegally Lymphatics:  No adenopathy Back:  No CVA tenderness Lungs:  Clear HEART:  Regular rate rhythm, no murmurs, no rubs, no clicks Abd:  soft, positive bowel sounds, no organomegally, no rebound, no guarding Ext:  2 plus pulses, no edema, no cyanosis, no clubbing Skin:  No rashes no nodules Neuro:  CN II through XII intact, motor grossly intact  EKG - reviewed - nsr  with LBBB  DEVICE  Normal device function.  See PaceArt for details.   Assess/Plan: 1. Atrial flutter - I have only one ECG with what may be atrial flutter. I am not sure of diagnosis. I will attempt to corroborate the diagnosis of atrial flutter with Dr. Einar Gip. He is on Eliquis and would like to avoid this medication long term if possible. I have discussed the indications, risks/benefits/goals/expectations of EP study and catheter ablation with the patient  and he wishes to proceed. We will attempt to obtain any additional ECG which demonstrate his atrial flutter. 2. LBBB - he denies a h/o syncope. Watchful waiting. 3. HTN - his blood pressure controlled. No change in meds. He is encouraged to maintain a low sodium diet.  Mikle Bosworth.D.

## 2017-02-07 NOTE — Patient Instructions (Addendum)
Medication Instructions:  Your physician recommends that you continue on your current medications as directed. Please refer to the Current Medication list given to you today.  Labwork: None ordered.  Testing/Procedures: None ordered.  Follow-Up:  We will call you to set up an ablation when we have documentation of your atrial flutter.  The following are the available dates for an ablation: October 8, 10, 22, 29 November 12, 28  [place], [place] [phone]  Any Other Special Instructions Will Be Listed Below (If Applicable).   Cardiac Ablation Cardiac ablation is a procedure to disable (ablate) a small amount of heart tissue in very specific places. The heart has many electrical connections. Sometimes these connections are abnormal and can cause the heart to beat very fast or irregularly. Ablating some of the problem areas can improve the heart rhythm or return it to normal. Ablation may be done for people who:  Have Wolff-Parkinson-White syndrome.  Have fast heart rhythms (tachycardia).  Have taken medicines for an abnormal heart rhythm (arrhythmia) that were not effective or caused side effects.  Have a high-risk heartbeat that may be life-threatening.  During the procedure, a small incision is made in the neck or the groin, and a long, thin, flexible tube (catheter) is inserted into the incision and moved to the heart. Small devices (electrodes) on the tip of the catheter will send out electrical currents. A type of X-ray (fluoroscopy) will be used to help guide the catheter and to provide images of the heart. Tell a health care provider about:  Any allergies you have.  All medicines you are taking, including vitamins, herbs, eye drops, creams, and over-the-counter medicines.  Any problems you or family members have had with anesthetic medicines.  Any blood disorders you have.  Any surgeries you have had.  Any medical conditions you have, such as kidney  failure.  Whether you are pregnant or may be pregnant. What are the risks? Generally, this is a safe procedure. However, problems may occur, including:  Infection.  Bruising and bleeding at the catheter insertion site.  Bleeding into the chest, especially into the sac that surrounds the heart. This is a serious complication.  Stroke or blood clots.  Damage to other structures or organs.  Allergic reaction to medicines or dyes.  Need for a permanent pacemaker if the normal electrical system is damaged. A pacemaker is a small computer that sends electrical signals to the heart and helps your heart beat normally.  The procedure not being fully effective. This may not be recognized until months later. Repeat ablation procedures are sometimes required.  What happens before the procedure?  Follow instructions from your health care provider about eating or drinking restrictions.  Ask your health care provider about: ? Changing or stopping your regular medicines. This is especially important if you are taking diabetes medicines or blood thinners. ? Taking medicines such as aspirin and ibuprofen. These medicines can thin your blood. Do not take these medicines before your procedure if your health care provider instructs you not to.  Plan to have someone take you home from the hospital or clinic.  If you will be going home right after the procedure, plan to have someone with you for 24 hours. What happens during the procedure?  To lower your risk of infection: ? Your health care team will wash or sanitize their hands. ? Your skin will be washed with soap. ? Hair may be removed from the incision area.  An IV tube will be  inserted into one of your veins.  You will be given a medicine to help you relax (sedative).  The skin on your neck or groin will be numbed.  An incision will be made in your neck or your groin.  A needle will be inserted through the incision and into a large  vein in your neck or groin.  A catheter will be inserted into the needle and moved to your heart.  Dye may be injected through the catheter to help your surgeon see the area of the heart that needs treatment.  Electrical currents will be sent from the catheter to ablate heart tissue in desired areas. There are three types of energy that may be used to ablate heart tissue: ? Heat (radiofrequency energy). ? Laser energy. ? Extreme cold (cryoablation).  When the necessary tissue has been ablated, the catheter will be removed.  Pressure will be held on the catheter insertion area to prevent excessive bleeding.  A bandage (dressing) will be placed over the catheter insertion area. The procedure may vary among health care providers and hospitals. What happens after the procedure?  Your blood pressure, heart rate, breathing rate, and blood oxygen level will be monitored until the medicines you were given have worn off.  Your catheter insertion area will be monitored for bleeding. You will need to lie still for a few hours to ensure that you do not bleed from the catheter insertion area.  Do not drive for 24 hours or as long as directed by your health care provider. Summary  Cardiac ablation is a procedure to disable (ablate) a small amount of heart tissue in very specific places. Ablating some of the problem areas can improve the heart rhythm or return it to normal.  During the procedure, electrical currents will be sent from the catheter to ablate heart tissue in desired areas. This information is not intended to replace advice given to you by your health care provider. Make sure you discuss any questions you have with your health care provider. Document Released: 09/10/2008 Document Revised: 03/13/2016 Document Reviewed: 03/13/2016 Elsevier Interactive Patient Education  [employer].     If you need a refill on your cardiac medications before your next appointment, please call  your pharmacy.

## 2017-02-09 ENCOUNTER — Telehealth: Payer: Self-pay | Admitting: Internal Medicine

## 2017-02-09 DIAGNOSIS — Z23 Encounter for immunization: Secondary | ICD-10-CM | POA: Diagnosis not present

## 2017-02-09 DIAGNOSIS — N184 Chronic kidney disease, stage 4 (severe): Secondary | ICD-10-CM | POA: Diagnosis not present

## 2017-02-09 NOTE — Telephone Encounter (Signed)
Will forward to Dr. Tanna Furry nurse.

## 2017-02-09 NOTE — Telephone Encounter (Signed)
°  New Prob  Has chosen 02/14/17 for ablation. Requesting follow up to confirm scheduling.

## 2017-02-12 DIAGNOSIS — N1330 Unspecified hydronephrosis: Secondary | ICD-10-CM | POA: Diagnosis not present

## 2017-02-12 DIAGNOSIS — N184 Chronic kidney disease, stage 4 (severe): Secondary | ICD-10-CM | POA: Diagnosis not present

## 2017-02-12 DIAGNOSIS — Z6832 Body mass index (BMI) 32.0-32.9, adult: Secondary | ICD-10-CM | POA: Diagnosis not present

## 2017-02-12 DIAGNOSIS — E559 Vitamin D deficiency, unspecified: Secondary | ICD-10-CM | POA: Diagnosis not present

## 2017-02-12 DIAGNOSIS — I10 Essential (primary) hypertension: Secondary | ICD-10-CM | POA: Diagnosis not present

## 2017-02-12 NOTE — Telephone Encounter (Signed)
Call left for Pt's wife.  Notified wife that this office requested additional information from Dr. Irven Shelling office on Thursday 02/08/2017.  Have still not received documentation needed for procedure.  Will contact Dr. Einar Gip office again today when office open.  At this time cannot move forward with procedure until receive documentation from [facility] office.

## 2017-02-13 NOTE — Telephone Encounter (Signed)
Per Dr. Einar Gip office, all Pt clinical documentation was submitted for Dr. Tanna Furry review.  Suggested Dr. Lovena Le make outreach to Dr. Einar Gip for further clinical concerns.  Notified Dr. Lovena Le, will await feedback.

## 2017-02-13 NOTE — Telephone Encounter (Signed)
Follow up    Pt wife is calling to follow up on this. Please call.

## 2017-02-16 ENCOUNTER — Ambulatory Visit (INDEPENDENT_AMBULATORY_CARE_PROVIDER_SITE_OTHER): Payer: PPO | Admitting: Internal Medicine

## 2017-02-16 ENCOUNTER — Encounter: Payer: Self-pay | Admitting: Internal Medicine

## 2017-02-16 VITALS — BP 158/112 | HR 78 | Ht 72.0 in | Wt 240.4 lb

## 2017-02-16 DIAGNOSIS — I472 Ventricular tachycardia: Secondary | ICD-10-CM

## 2017-02-16 DIAGNOSIS — I4892 Unspecified atrial flutter: Secondary | ICD-10-CM | POA: Diagnosis not present

## 2017-02-16 NOTE — Patient Instructions (Addendum)
Medication Instructions:  Your physician recommends that you continue on your current medications as directed. Please refer to the Current Medication list given to you today.  Labwork: You will get bloodwork today:  CBC and BMP.  Testing/Procedures: Your physician has recommended that you have an ablation. Catheter ablation is a medical procedure used to treat some cardiac arrhythmias (irregular heartbeats). During catheter ablation, a long, thin, flexible tube is put into a blood vessel in your groin (upper thigh), or neck. This tube is called an ablation catheter. It is then guided to your heart through the blood vessel. Radio frequency waves destroy small areas of heart tissue where abnormal heartbeats may cause an arrhythmia to start. Please see the instruction sheet given to you today.   Follow-Up: Your physician wants you to follow-up in: 4 weeks with Dr. Lovena Le.    Any Other Special Instructions Will Be Listed Below (If Applicable).  Please arrive at the [facility] main entrance of [place] hospital at:  02/26/2017 @ 10:00 am. Do not eat or drink after midnight prior to procedure Do not take any medications the morning of the procedure: Hold your metoprolol for one day prior to your procedure (last dose will be Saturday evening).  Hold your Eliquis the day of your procedure. Use the CHG scrub as directed and given you today.   Plan for one night stay You will need someone to drive you home at discharge  Call me if you have any additional questions:  Sonia Baller [phone]    If you need a refill on your cardiac medications before your next appointment, please call your pharmacy.

## 2017-02-16 NOTE — Progress Notes (Signed)
HPI [name] returns today to discuss catheter ablation of Atrial flutter. He is a pleasant 56 yo man with a h/o palpitations and possible atrial flutter who has been placed on anti-coagulation and who I saw 10 days ago. In the interim, he has had no palpitations but notes sob. He has preserved LV function by echo and no evidence of CAD. He does have chronic LBBB. I have reviewed his ECG again today and suspect that his diagnosis is not Atrial fluttter but BBRVT. No recent syncope but he did have an episode of syncope over a year ago while driving.  Allergies  Allergen Reactions  . Methylpyrrolidone Hives    froze the intestine  . Niacin Other (See Comments)    Flushing, itching, tingling   . Ciprofloxacin Other (See Comments)    Felt flushed  . Oxybutynin Chloride [Oxybutynin Chloride Er] Other (See Comments)    froze the intestine  . Vesicare [Solifenacin Succinate] Other (See Comments)    Froze the intestine      Current Outpatient Prescriptions  Medication Sig Dispense Refill  . albuterol (PROVENTIL HFA;VENTOLIN HFA) 108 (90 BASE) MCG/ACT inhaler Inhale 2 puffs into the lungs every 6 (six) hours as needed for wheezing or shortness of breath.     . budesonide-formoterol (SYMBICORT) 80-4.5 MCG/ACT inhaler Inhale 2 puffs into the lungs 2 (two) times daily. (Patient taking differently: Inhale 2 puffs into the lungs 2 (two) times daily as needed (SOB, wheezing). ) 1 Inhaler 11  . buPROPion (WELLBUTRIN SR) 150 MG 12 hr tablet Take 150 mg by mouth every morning.     . cholecalciferol (VITAMIN D) 1000 units tablet Take 1,000 Units by mouth daily.    [name] ELIQUIS 5 MG TABS tablet Take 5 mg by mouth 2 (two) times daily.    Lorayne Bender SUSTENNA 156 MG/ML SUSP injection Inject 156 mg as directed every 30 (thirty) days.    . metoprolol tartrate (LOPRESSOR) 25 MG tablet Take 1 tablet (25 mg total) by mouth 2 (two) times daily. 60 tablet 0  . montelukast (SINGULAIR) 10 MG tablet Take 10 mg by  mouth at bedtime.    . ondansetron (ZOFRAN ODT) 4 MG disintegrating tablet Take 1 tablet (4 mg total) by mouth every 8 (eight) hours as needed for nausea or vomiting. 10 tablet 0  . pantoprazole (PROTONIX) 40 MG tablet Take 1 tablet (40 mg total) by mouth 2 (two) times daily. 60 tablet 0  . hydrALAZINE (APRESOLINE) 50 MG tablet Take 50 mg by mouth 3 (three) times daily.     No current facility-administered medications for this visit.    Facility-Administered Medications Ordered in Other Visits  Medication Dose Route Frequency Provider Last Rate Last Dose  . acetaminophen (OFIRMEV) IVPB    PRN Lissa Morales, CRNA   1,000 mg at 04/19/11 0910  . ePHEDrine injection    PRN Lissa Morales, CRNA   5 mg at 04/19/11 1018  . glycopyrrolate (ROBINUL) injection    PRN Lissa Morales, CRNA   0.8 mg at 04/19/11 1037  . SUFentanil (SUFENTA) injection    PRN Lissa Morales, CRNA   10 mcg at 04/19/11 1008     Past Medical History:  Diagnosis Date  . Asthma    uses inhalers   . Bilateral carotid bruits   . Chronic kidney disease    bladder interstial cystitis   . Chronic kidney disease (CKD), stage IV (severe) ([name])    [place]  [facility] [place]. Jo[facility] [facility] [place]  . Depression   . GERD (gastroesophageal reflux disease)   . History of stomach ulcers 2001  . Hypertension   . LBBB (left bundle branch block)   . Lower extremity edema   . Mild intermittent asthma without complication   . Mixed hyperlipidemia   . Paroxysmal atrial flutter ([place])   . PONV (postoperative nausea and vomiting)   . Schizophrenia ([place])     ROS:   All systems reviewed and negative except as noted in the HPI.   Past Surgical History:  Procedure Laterality Date  . APPENDECTOMY    . ESOPHAGOGASTRODUODENOSCOPY (EGD) WITH PROPOFOL N/A 09/18/2016   Procedure: ESOPHAGOGASTRODUODENOSCOPY (EGD) WITH PROPOFOL;  Surgeon: Ronnette Juniper, MD;  Location: [place];  Service: Gastroenterology;  Laterality: N/A;  . LUMBAR  LAMINECTOMY/DECOMPRESSION MICRODISCECTOMY  04/19/2011   Procedure: LUMBAR LAMINECTOMY/DECOMPRESSION MICRODISCECTOMY;  Surgeon: Tobi Bastos;  Location: [facility];  Service: Orthopedics;  Laterality: Left;  Hemi LAminectomy/Microdiscectomy Lumbar four  - Lumbar five  on the Left (X-Ray)  . TONSILLECTOMY       Family History  Problem Relation Age of Onset  . High blood pressure Mother   . Alzheimer's disease Father      Social History   Social History  . Marital status: Married    Spouse name: N/A  . Number of children: N/A  . Years of education: N/A   Occupational History  . Not on file.   Social History Main Topics  . Smoking status: Former Smoker    Packs/day: 1.00    Years: 30.00    Quit date: 05/08/2008  . Smokeless tobacco: Never Used  . Alcohol use No  . Drug use: No  . Sexual activity: No   Other Topics Concern  . Not on file   Social History Narrative  . No narrative on file     BP (!) 158/112   Pulse 78   Ht 6' (1.829 m)   Wt 240 lb 6.4 oz (109 kg)   SpO2 98%   BMI 32.60 kg/m   Physical Exam:  Well appearing 56 yo man NAD HEENT: Unremarkable Neck:  No JVD, no thyromegally Lymphatics:  No adenopathy Back:  No CVA tenderness Lungs:  Clear HEART:  Regular rate rhythm, no murmurs, no rubs, no clicks Abd:  soft, positive bowel sounds, no organomegally, no rebound, no guarding Ext:  2 plus pulses, no edema, no cyanosis, no clubbing Skin:  No rashes no nodules Neuro:  CN II through XII intact, motor grossly intact  EKG - NSR with LBBB   Assess/Plan: 1. 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 I have discussed this with the patient and his wife. I have offered him cessation of his anti-coagulation and watchful waiting vs EP study and catheter ablation if his VT can be induced. If his VT is inducible it is likely to be associated with CHB during a successful ablation and  would require PPM insertion. I have reviewed this with the patient and he would like to plan to proceed with ablation if inducible.  2. Syncope - the mechanism is unclear. I have discussed placement of an ILR if he does not end up with PPM. 3. Sob - I suspect that this is multifactorial. We will work on this as time goes on. Might end up needing to repeat his echo.  4. HTN - his blood pressure is  elevated today. He will reduce his salt intake.  Asti Mackley,M.D.  Mikle Bosworth.D.

## 2017-02-16 NOTE — Telephone Encounter (Signed)
Pt wife previously called and appt scheduled for today @ 1:45 pm.  Closing thread.

## 2017-02-20 DIAGNOSIS — F250 Schizoaffective disorder, bipolar type: Secondary | ICD-10-CM | POA: Diagnosis not present

## 2017-02-20 LAB — CBC
Hematocrit: 37.5 % (ref 37.5–51.0)
Hemoglobin: 12.6 g/dL — ABNORMAL LOW (ref 13.0–17.7)
MCH: 28.4 pg (ref 26.6–33.0)
MCHC: 33.6 g/dL (ref 31.5–35.7)
MCV: 85 fL (ref 79–97)
Platelets: 254 10*3/uL (ref 150–379)
RBC: 4.43 x10E6/uL (ref 4.14–5.80)
RDW: 14.3 % (ref 12.3–15.4)
WBC: 8.3 10*3/uL (ref 3.4–10.8)

## 2017-02-20 LAB — BASIC METABOLIC PANEL
BUN / CREAT RATIO: 9 (ref 9–20)
BUN: 15 mg/dL (ref 6–24)
CHLORIDE: 105 mmol/L (ref 96–106)
CO2: 21 mmol/L (ref 20–29)
Calcium: 9 mg/dL (ref 8.7–10.2)
Creatinine, Ser: 1.64 mg/dL — ABNORMAL HIGH (ref 0.76–1.27)
GFR calc Af Amer: 53 mL/min/{1.73_m2} — ABNORMAL LOW (ref >59)
GFR calc non Af Amer: 45 mL/min/{1.73_m2} — ABNORMAL LOW (ref >59)
Glucose: 91 mg/dL (ref 65–99)
POTASSIUM: 4.8 mmol/L (ref 3.5–5.2)
Sodium: 140 mmol/L (ref 134–144)

## 2017-02-26 ENCOUNTER — Encounter (HOSPITAL_COMMUNITY)
Admission: RE | Disposition: A | Discharge status: Home / Self Care | Payer: Self-pay | Source: Ambulatory Visit | Attending: Internal Medicine

## 2017-02-26 ENCOUNTER — Ambulatory Visit (HOSPITAL_COMMUNITY)
Admission: RE | Admit: 2017-02-26 | Discharge: 2017-02-27 | Disposition: A | Discharge status: Home / Self Care | Payer: PPO | Source: Ambulatory Visit | Attending: Internal Medicine | Admitting: Internal Medicine

## 2017-02-26 DIAGNOSIS — J45909 Unspecified asthma, uncomplicated: Secondary | ICD-10-CM | POA: Insufficient documentation

## 2017-02-26 DIAGNOSIS — F209 Schizophrenia, unspecified: Secondary | ICD-10-CM | POA: Diagnosis not present

## 2017-02-26 DIAGNOSIS — Z87891 Personal history of nicotine dependence: Secondary | ICD-10-CM | POA: Insufficient documentation

## 2017-02-26 DIAGNOSIS — Z950 Presence of cardiac pacemaker: Secondary | ICD-10-CM

## 2017-02-26 DIAGNOSIS — Z7901 Long term (current) use of anticoagulants: Secondary | ICD-10-CM | POA: Diagnosis not present

## 2017-02-26 DIAGNOSIS — Z8719 Personal history of other diseases of the digestive system: Secondary | ICD-10-CM | POA: Diagnosis not present

## 2017-02-26 DIAGNOSIS — I472 Ventricular tachycardia, unspecified: Secondary | ICD-10-CM

## 2017-02-26 DIAGNOSIS — F329 Major depressive disorder, single episode, unspecified: Secondary | ICD-10-CM | POA: Diagnosis not present

## 2017-02-26 DIAGNOSIS — N184 Chronic kidney disease, stage 4 (severe): Secondary | ICD-10-CM | POA: Diagnosis not present

## 2017-02-26 DIAGNOSIS — K219 Gastro-esophageal reflux disease without esophagitis: Secondary | ICD-10-CM | POA: Diagnosis not present

## 2017-02-26 DIAGNOSIS — R55 Syncope and collapse: Secondary | ICD-10-CM | POA: Insufficient documentation

## 2017-02-26 DIAGNOSIS — Z7951 Long term (current) use of inhaled steroids: Secondary | ICD-10-CM | POA: Insufficient documentation

## 2017-02-26 DIAGNOSIS — I129 Hypertensive chronic kidney disease with stage 1 through stage 4 chronic kidney disease, or unspecified chronic kidney disease: Secondary | ICD-10-CM | POA: Insufficient documentation

## 2017-02-26 DIAGNOSIS — I447 Left bundle-branch block, unspecified: Secondary | ICD-10-CM | POA: Diagnosis not present

## 2017-02-26 DIAGNOSIS — E782 Mixed hyperlipidemia: Secondary | ICD-10-CM | POA: Diagnosis not present

## 2017-02-26 HISTORY — PX: ELECTROPHYSIOLOGY STUDY: EP1205

## 2017-02-26 HISTORY — DX: Presence of cardiac pacemaker: Z95.0

## 2017-02-26 HISTORY — PX: PACEMAKER IMPLANT: EP1218

## 2017-02-26 LAB — SURGICAL PCR SCREEN
MRSA, PCR: NEGATIVE
STAPHYLOCOCCUS AUREUS: NEGATIVE

## 2017-02-26 SURGERY — ELECTROPHYSIOLOGY STUDY

## 2017-02-26 MED ORDER — SODIUM CHLORIDE 0.9 % IV SOLN: INTRAVENOUS | Status: DC

## 2017-02-26 MED ORDER — MONTELUKAST SODIUM 10 MG PO TABS
10.0000 mg | ORAL_TABLET | Freq: Every day | ORAL | Status: DC
  Administered 2017-02-26: 10 mg via ORAL
  Filled 2017-02-26: qty 1

## 2017-02-26 MED ORDER — MORPHINE SULFATE (PF) 2 MG/ML IV SOLN
2.0000 mg | Freq: Once | INTRAVENOUS | Status: AC
  Administered 2017-02-26: 2 mg via INTRAVENOUS
  Filled 2017-02-26: qty 1

## 2017-02-26 MED ORDER — MIDAZOLAM HCL 5 MG/5ML IJ SOLN
INTRAMUSCULAR | Status: DC | PRN
  Administered 2017-02-26 (×6): 1 mg via INTRAVENOUS
  Administered 2017-02-26: 2 mg via INTRAVENOUS
  Administered 2017-02-26 (×3): 1 mg via INTRAVENOUS

## 2017-02-26 MED ORDER — MIDAZOLAM HCL 5 MG/5ML IJ SOLN
INTRAMUSCULAR | Status: AC
  Filled 2017-02-26: qty 5

## 2017-02-26 MED ORDER — SODIUM CHLORIDE 0.9 % IR SOLN
80.0000 mg | Status: AC
  Administered 2017-02-26: 80 mg

## 2017-02-26 MED ORDER — HEPARIN (PORCINE) IN NACL 2-0.9 UNIT/ML-% IJ SOLN
INTRAMUSCULAR | Status: AC
  Filled 2017-02-26: qty 500

## 2017-02-26 MED ORDER — ISOPROTERENOL HCL 0.2 MG/ML IJ SOLN
INTRAMUSCULAR | Status: AC
  Filled 2017-02-26: qty 5

## 2017-02-26 MED ORDER — BUPIVACAINE HCL (PF) 0.25 % IJ SOLN
INTRAMUSCULAR | Status: AC
  Filled 2017-02-26: qty 60

## 2017-02-26 MED ORDER — CEFAZOLIN SODIUM-DEXTROSE 1-4 GM/50ML-% IV SOLN
1.0000 g | Freq: Four times a day (QID) | INTRAVENOUS | Status: AC
  Administered 2017-02-26 – 2017-02-27 (×3): 1 g via INTRAVENOUS
  Filled 2017-02-26 (×3): qty 50

## 2017-02-26 MED ORDER — PALIPERIDONE ER 3 MG PO TB24
6.0000 mg | ORAL_TABLET | Freq: Every day | ORAL | Status: DC
  Administered 2017-02-26: 6 mg via ORAL
  Filled 2017-02-26: qty 2

## 2017-02-26 MED ORDER — ONDANSETRON HCL 4 MG/2ML IJ SOLN: 4.0000 mg | Freq: Four times a day (QID) | INTRAMUSCULAR | Status: DC | PRN

## 2017-02-26 MED ORDER — CHLORHEXIDINE GLUCONATE 4 % EX LIQD: 60.0000 mL | Freq: Once | CUTANEOUS | Status: DC

## 2017-02-26 MED ORDER — VITAMIN D 1000 UNITS PO TABS
1000.0000 [IU] | ORAL_TABLET | Freq: Every day | ORAL | Status: DC
  Administered 2017-02-26 – 2017-02-27 (×2): 1000 [IU] via ORAL
  Filled 2017-02-26 (×2): qty 1

## 2017-02-26 MED ORDER — ALBUTEROL SULFATE HFA 108 (90 BASE) MCG/ACT IN AERS: 2.0000 | INHALATION_SPRAY | Freq: Four times a day (QID) | RESPIRATORY_TRACT | Status: DC | PRN

## 2017-02-26 MED ORDER — SODIUM CHLORIDE 0.9 % IV SOLN
INTRAVENOUS | Status: DC | PRN
  Administered 2017-02-26: .5 ug/min via INTRAVENOUS

## 2017-02-26 MED ORDER — BUPROPION HCL ER (SR) 150 MG PO TB12
150.0000 mg | ORAL_TABLET | Freq: Two times a day (BID) | ORAL | Status: DC
  Administered 2017-02-26 – 2017-02-27 (×2): 150 mg via ORAL
  Filled 2017-02-26 (×3): qty 1

## 2017-02-26 MED ORDER — METOPROLOL TARTRATE 25 MG PO TABS
25.0000 mg | ORAL_TABLET | Freq: Two times a day (BID) | ORAL | Status: DC
  Administered 2017-02-26 – 2017-02-27 (×3): 25 mg via ORAL
  Filled 2017-02-26 (×3): qty 1

## 2017-02-26 MED ORDER — SODIUM CHLORIDE 0.9 % IR SOLN
Status: AC
  Filled 2017-02-26: qty 2

## 2017-02-26 MED ORDER — CEFAZOLIN SODIUM-DEXTROSE 2-4 GM/100ML-% IV SOLN
2.0000 g | INTRAVENOUS | Status: AC
  Administered 2017-02-26: 2 g via INTRAVENOUS

## 2017-02-26 MED ORDER — FENTANYL CITRATE (PF) 100 MCG/2ML IJ SOLN
INTRAMUSCULAR | Status: AC
  Filled 2017-02-26: qty 2

## 2017-02-26 MED ORDER — FENTANYL CITRATE (PF) 100 MCG/2ML IJ SOLN
INTRAMUSCULAR | Status: DC | PRN
  Administered 2017-02-26 (×4): 12.5 ug via INTRAVENOUS
  Administered 2017-02-26: 25 ug via INTRAVENOUS
  Administered 2017-02-26 (×5): 12.5 ug via INTRAVENOUS

## 2017-02-26 MED ORDER — ALBUTEROL SULFATE (2.5 MG/3ML) 0.083% IN NEBU: 2.5000 mg | INHALATION_SOLUTION | Freq: Four times a day (QID) | RESPIRATORY_TRACT | Status: DC | PRN

## 2017-02-26 MED ORDER — BUPIVACAINE HCL (PF) 0.25 % IJ SOLN
INTRAMUSCULAR | Status: DC | PRN
  Administered 2017-02-26: 60 mL
  Administered 2017-02-26: 35 mL

## 2017-02-26 MED ORDER — MUPIROCIN 2 % EX OINT
TOPICAL_OINTMENT | CUTANEOUS | Status: AC
  Filled 2017-02-26: qty 22

## 2017-02-26 MED ORDER — ACETAMINOPHEN 325 MG PO TABS
325.0000 mg | ORAL_TABLET | ORAL | Status: DC | PRN
  Administered 2017-02-26: 650 mg via ORAL
  Filled 2017-02-26: qty 2

## 2017-02-26 MED ORDER — HEPARIN (PORCINE) IN NACL 2-0.9 UNIT/ML-% IJ SOLN
INTRAMUSCULAR | Status: AC | PRN
  Administered 2017-02-26: 500 mL

## 2017-02-26 MED ORDER — CEFAZOLIN SODIUM-DEXTROSE 2-4 GM/100ML-% IV SOLN
INTRAVENOUS | Status: AC
  Filled 2017-02-26: qty 100

## 2017-02-26 MED ORDER — HYDRALAZINE HCL 50 MG PO TABS
50.0000 mg | ORAL_TABLET | Freq: Three times a day (TID) | ORAL | Status: DC
  Administered 2017-02-26 – 2017-02-27 (×3): 50 mg via ORAL
  Filled 2017-02-26 (×4): qty 1

## 2017-02-26 SURGICAL SUPPLY — 21 items
BAG SNAP BAND KOVER 36X36 (MISCELLANEOUS) ×1 IMPLANT
CATH HEX JOSEPH 2-5-2 65CM 6F (CATHETERS) ×1 IMPLANT
CATH JOSEPHSON QUAD-ALLRED 6FR (CATHETERS) ×2 IMPLANT
CATH RIGHTSITE C315HIS02 (CATHETERS) ×1 IMPLANT
HOVERMATT SINGLE USE (MISCELLANEOUS) ×1 IMPLANT
IPG PACE AZUR XT DR MRI W1DR01 (Pacemaker) IMPLANT
LEAD CAPSURE NOVUS 5076-52CM (Lead) ×1 IMPLANT
LEAD SELECT SECURE 3830 383069 (Lead) IMPLANT
PACE AZURE XT DR MRI W1DR01 (Pacemaker) ×2 IMPLANT
PACK EP LATEX FREE (CUSTOM PROCEDURE TRAY) ×2
PACK EP LF (CUSTOM PROCEDURE TRAY) IMPLANT
PAD DEFIB LIFELINK (PAD) ×1 IMPLANT
PATCH CARTO3 (PAD) ×1 IMPLANT
SELECT SECURE 3830 383069 (Lead) ×2 IMPLANT
SHEATH CLASSIC 7F (SHEATH) ×2 IMPLANT
SHEATH PINNACLE 6F 10CM (SHEATH) ×2 IMPLANT
SHEATH PINNACLE 7F 10CM (SHEATH) ×1 IMPLANT
SHEATH PINNACLE 8F 10CM (SHEATH) ×1 IMPLANT
SHIELD RADPAD SCOOP 12X17 (MISCELLANEOUS) ×1 IMPLANT
SLITTER 6232ADJ (MISCELLANEOUS) ×1 IMPLANT
WIRE HI TORQ VERSACORE-J 145CM (WIRE) ×1 IMPLANT

## 2017-02-26 NOTE — Progress Notes (Signed)
Site area: rt ij venous sheath Site Prior to Removal:  Level 0 Pressure Applied For: 10 minutes Manual:   yes Patient Status During Pull:  stable Post Pull Site:  Level  0 Post Pull Instructions Given:  yes Post Pull Pulses Present:  na Dressing Applied:   Gauze and tegadem Bedrest begins @  Comments:

## 2017-02-26 NOTE — Progress Notes (Signed)
Orthopedic Tech Progress Note Patient Details:  Peter Marsh 06/03/1960 [id number]  Ortho Devices Type of Ortho Device: Arm sling Ortho Device/Splint Location: Pt has arm sling on left arm at this time.     Kristopher Oppenheim 02/26/2017, 5:55 PM

## 2017-02-26 NOTE — Interval H&P Note (Signed)
History and Physical Interval Note:  02/26/2017 11:09 AM  Peter Marsh  has presented today for surgery, with the diagnosis of aflutter vs Vtach  The various methods of treatment have been discussed with the patient and family. After consideration of risks, benefits and other options for treatment, the patient has consented to  Procedure(s): ELECTROPHYSIOLOGY STUDY (N/A) as a surgical intervention .  The patient's history has been reviewed, patient examined, no change in status, stable for surgery.  I have reviewed the patient's chart and labs.  Questions were answered to the patient's satisfaction.  He will undergo wide QRS tachycardia ablation and will possibly need PPM if he has bundle branch reentrant VT.    Cristopher Peru

## 2017-02-26 NOTE — H&P (View-Only) (Signed)
HPI [name] returns today to discuss catheter ablation of Atrial flutter. He is a pleasant 56 yo man with a h/o palpitations and possible atrial flutter who has been placed on anti-coagulation and who I saw 10 days ago. In the interim, he has had no palpitations but notes sob. He has preserved LV function by echo and no evidence of CAD. He does have chronic LBBB. I have reviewed his ECG again today and suspect that his diagnosis is not Atrial fluttter but BBRVT. No recent syncope but he did have an episode of syncope over a year ago while driving.  Allergies  Allergen Reactions  . Methylpyrrolidone Hives    froze the intestine  . Niacin Other (See Comments)    Flushing, itching, tingling   . Ciprofloxacin Other (See Comments)    Felt flushed  . Oxybutynin Chloride [Oxybutynin Chloride Er] Other (See Comments)    froze the intestine  . Vesicare [Solifenacin Succinate] Other (See Comments)    Froze the intestine      Current Outpatient Prescriptions  Medication Sig Dispense Refill  . albuterol (PROVENTIL HFA;VENTOLIN HFA) 108 (90 BASE) MCG/ACT inhaler Inhale 2 puffs into the lungs every 6 (six) hours as needed for wheezing or shortness of breath.     . budesonide-formoterol (SYMBICORT) 80-4.5 MCG/ACT inhaler Inhale 2 puffs into the lungs 2 (two) times daily. (Patient taking differently: Inhale 2 puffs into the lungs 2 (two) times daily as needed (SOB, wheezing). ) 1 Inhaler 11  . buPROPion (WELLBUTRIN SR) 150 MG 12 hr tablet Take 150 mg by mouth every morning.     . cholecalciferol (VITAMIN D) 1000 units tablet Take 1,000 Units by mouth daily.    [name] ELIQUIS 5 MG TABS tablet Take 5 mg by mouth 2 (two) times daily.    Lorayne Bender SUSTENNA 156 MG/ML SUSP injection Inject 156 mg as directed every 30 (thirty) days.    . metoprolol tartrate (LOPRESSOR) 25 MG tablet Take 1 tablet (25 mg total) by mouth 2 (two) times daily. 60 tablet 0  . montelukast (SINGULAIR) 10 MG tablet Take 10 mg by  mouth at bedtime.    . ondansetron (ZOFRAN ODT) 4 MG disintegrating tablet Take 1 tablet (4 mg total) by mouth every 8 (eight) hours as needed for nausea or vomiting. 10 tablet 0  . pantoprazole (PROTONIX) 40 MG tablet Take 1 tablet (40 mg total) by mouth 2 (two) times daily. 60 tablet 0  . hydrALAZINE (APRESOLINE) 50 MG tablet Take 50 mg by mouth 3 (three) times daily.     No current facility-administered medications for this visit.    Facility-Administered Medications Ordered in Other Visits  Medication Dose Route Frequency Provider Last Rate Last Dose  . acetaminophen (OFIRMEV) IVPB    PRN Lissa Morales, CRNA   1,000 mg at 04/19/11 0910  . ePHEDrine injection    PRN Lissa Morales, CRNA   5 mg at 04/19/11 1018  . glycopyrrolate (ROBINUL) injection    PRN Lissa Morales, CRNA   0.8 mg at 04/19/11 1037  . SUFentanil (SUFENTA) injection    PRN Lissa Morales, CRNA   10 mcg at 04/19/11 1008     Past Medical History:  Diagnosis Date  . Asthma    uses inhalers   . Bilateral carotid bruits   . Chronic kidney disease    bladder interstial cystitis   . Chronic kidney disease (CKD), stage IV (severe) ([name])    followed  by Dr. Joelyn Oms at [facility]  . Depression   . GERD (gastroesophageal reflux disease)   . History of stomach ulcers 2001  . Hypertension   . LBBB (left bundle branch block)   . Lower extremity edema   . Mild intermittent asthma without complication   . Mixed hyperlipidemia   . Paroxysmal atrial flutter ([place])   . PONV (postoperative nausea and vomiting)   . Schizophrenia ([place])     ROS:   All systems reviewed and negative except as noted in the HPI.   Past Surgical History:  Procedure Laterality Date  . APPENDECTOMY    . ESOPHAGOGASTRODUODENOSCOPY (EGD) WITH PROPOFOL N/A 09/18/2016   Procedure: ESOPHAGOGASTRODUODENOSCOPY (EGD) WITH PROPOFOL;  Surgeon: Ronnette Juniper, MD;  Location: [place];  Service: Gastroenterology;  Laterality: N/A;  . LUMBAR  LAMINECTOMY/DECOMPRESSION MICRODISCECTOMY  04/19/2011   Procedure: LUMBAR LAMINECTOMY/DECOMPRESSION MICRODISCECTOMY;  Surgeon: Tobi Bastos;  Location: [facility];  Service: Orthopedics;  Laterality: Left;  Hemi LAminectomy/Microdiscectomy Lumbar four  - Lumbar five  on the Left (X-Ray)  . TONSILLECTOMY       Family History  Problem Relation Age of Onset  . High blood pressure Mother   . Alzheimer's disease Father      Social History   Social History  . Marital status: Married    Spouse name: N/A  . Number of children: N/A  . Years of education: N/A   Occupational History  . Not on file.   Social History Main Topics  . Smoking status: Former Smoker    Packs/day: 1.00    Years: 30.00    Quit date: 05/08/2008  . Smokeless tobacco: Never Used  . Alcohol use No  . Drug use: No  . Sexual activity: No   Other Topics Concern  . Not on file   Social History Narrative  . No narrative on file     BP (!) 158/112   Pulse 78   Ht 6' (1.829 m)   Wt 240 lb 6.4 oz (109 kg)   SpO2 98%   BMI 32.60 kg/m   Physical Exam:  Well appearing 56 yo man NAD HEENT: Unremarkable Neck:  No JVD, no thyromegally Lymphatics:  No adenopathy Back:  No CVA tenderness Lungs:  Clear HEART:  Regular rate rhythm, no murmurs, no rubs, no clicks Abd:  soft, positive bowel sounds, no organomegally, no rebound, no guarding Ext:  2 plus pulses, no edema, no cyanosis, no clubbing Skin:  No rashes no nodules Neuro:  CN II through XII intact, motor grossly intact  EKG - NSR with LBBB   Assess/Plan: 1. 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 I have discussed this with the patient and his wife. I have offered him cessation of his anti-coagulation and watchful waiting vs EP study and catheter ablation if his VT can be induced. If his VT is inducible it is likely to be associated with CHB during a successful ablation and  would require PPM insertion. I have reviewed this with the patient and he would like to plan to proceed with ablation if inducible.  2. Syncope - the mechanism is unclear. I have discussed placement of an ILR if he does not end up with PPM. 3. Sob - I suspect that this is multifactorial. We will work on this as time goes on. Might end up needing to repeat his echo.  4. HTN - his blood pressure is  elevated today. He will reduce his salt intake.  Gregg Taylor,M.D.  Mikle Bosworth.D.

## 2017-02-26 NOTE — Progress Notes (Signed)
Site area: 3 rt fv sheaths Site Prior to Removal:  Level 0 Pressure Applied For:  15 minutes Manual:   yes Patient Status During Pull:  stable Post Pull Site:  Level 0 Post Pull Instructions Given:  yes Post Pull Pulses Present:  palpable Dressing Applied:  Gauze and tegaderm Bedrest begins @ 2330 Comments: IV saline locked

## 2017-02-27 ENCOUNTER — Ambulatory Visit (HOSPITAL_COMMUNITY): Payer: PPO

## 2017-02-27 ENCOUNTER — Encounter (HOSPITAL_COMMUNITY): Payer: Self-pay | Admitting: Internal Medicine

## 2017-02-27 DIAGNOSIS — Z7901 Long term (current) use of anticoagulants: Secondary | ICD-10-CM | POA: Diagnosis not present

## 2017-02-27 DIAGNOSIS — K219 Gastro-esophageal reflux disease without esophagitis: Secondary | ICD-10-CM | POA: Diagnosis not present

## 2017-02-27 DIAGNOSIS — Z7951 Long term (current) use of inhaled steroids: Secondary | ICD-10-CM | POA: Diagnosis not present

## 2017-02-27 DIAGNOSIS — E782 Mixed hyperlipidemia: Secondary | ICD-10-CM | POA: Diagnosis not present

## 2017-02-27 DIAGNOSIS — R55 Syncope and collapse: Secondary | ICD-10-CM | POA: Diagnosis not present

## 2017-02-27 DIAGNOSIS — I447 Left bundle-branch block, unspecified: Secondary | ICD-10-CM | POA: Diagnosis not present

## 2017-02-27 DIAGNOSIS — Z950 Presence of cardiac pacemaker: Secondary | ICD-10-CM | POA: Diagnosis not present

## 2017-02-27 DIAGNOSIS — Z8719 Personal history of other diseases of the digestive system: Secondary | ICD-10-CM | POA: Diagnosis not present

## 2017-02-27 DIAGNOSIS — J9811 Atelectasis: Secondary | ICD-10-CM | POA: Diagnosis not present

## 2017-02-27 DIAGNOSIS — J45909 Unspecified asthma, uncomplicated: Secondary | ICD-10-CM | POA: Diagnosis not present

## 2017-02-27 DIAGNOSIS — F209 Schizophrenia, unspecified: Secondary | ICD-10-CM | POA: Diagnosis not present

## 2017-02-27 DIAGNOSIS — I129 Hypertensive chronic kidney disease with stage 1 through stage 4 chronic kidney disease, or unspecified chronic kidney disease: Secondary | ICD-10-CM | POA: Diagnosis not present

## 2017-02-27 DIAGNOSIS — F329 Major depressive disorder, single episode, unspecified: Secondary | ICD-10-CM | POA: Diagnosis not present

## 2017-02-27 DIAGNOSIS — N184 Chronic kidney disease, stage 4 (severe): Secondary | ICD-10-CM | POA: Diagnosis not present

## 2017-02-27 MED ORDER — DIPHENHYDRAMINE HCL 25 MG PO CAPS
25.0000 mg | ORAL_CAPSULE | Freq: Every evening | ORAL | Status: DC | PRN
  Administered 2017-02-27: 25 mg via ORAL
  Filled 2017-02-27: qty 1

## 2017-02-27 MED ORDER — METOPROLOL TARTRATE 50 MG PO TABS: 50.0000 mg | ORAL_TABLET | Freq: Two times a day (BID) | ORAL | 6 refills | Status: DC

## 2017-02-27 NOTE — Discharge Instructions (Signed)
EPS procedure sites care (neck and right groin) No lifting over 5 lbs for 1 week. No sexual or vigorous activity for 1 week. Keep procedure site clean & dry. If you notice increased pain, swelling, bleeding or pus, call/return!  No soaking baths/hot tubs/pools for 1 week.        Supplemental Discharge Instructions for  Pacemaker/Defibrillator Patients  Activity No heavy lifting or vigorous activity with your left/right arm for 6 to 8 weeks.  Do not raise your left/right arm above your head for one week.  Gradually raise your affected arm as drawn below.             03/03/17                   03/04/17                  03/05/17                 03/06/17 __  NO DRIVING for 1 week  ; you may begin driving on  60/10/93   .  WOUND CARE - Keep the wound area clean and dry.  Do not get this area wet for one week. No showers for one week; you may shower on  03/06/17   . - The tape/steri-strips on your wound will fall off; do not pull them off.  No bandage is needed on the site.  DO  NOT apply any creams, oils, or ointments to the wound area. - If you notice any drainage or discharge from the wound, any swelling or bruising at the site, or you develop a fever > 101? F after you are discharged home, call the office at once.  Special Instructions - You are still able to use cellular telephones; use the ear opposite the side where you have your pacemaker/defibrillator.  Avoid carrying your cellular phone near your device. - When traveling through airports, show security personnel your identification card to avoid being screened in the metal detectors.  Ask the security personnel to use the hand wand. - Avoid arc welding equipment, MRI testing (magnetic resonance imaging), TENS units (transcutaneous nerve stimulators).  Call the office for questions about other devices. - Avoid electrical appliances that are in poor condition or are not properly grounded. - Microwave ovens are safe to be near or to  operate.

## 2017-02-27 NOTE — Discharge Summary (Signed)
ELECTROPHYSIOLOGY PROCEDURE DISCHARGE SUMMARY    Patient ID: Peter Marsh,  MRN: [id number], DOB/AGE: 58/05/1960 58 y.o.  Admit date: 02/26/2017 Discharge date: 02/27/2017  Primary Care Physician: Marda Stalker, PA-C Primary Cardiologist: Dr. Einar Gip Electrophysiologist: Dr. Lovena Le  Primary Discharge Diagnosis:  1. Syncope 2. Severe conduction system disease  Secondary Discharge Diagnosis:  1. HTN  Allergies  Allergen Reactions  . Methylpyrrolidone Hives    froze the intestine  . Niacin Other (See Comments)    Flushing, itching, tingling   . Ciprofloxacin Other (See Comments)    Felt flushed  . Oxybutynin Chloride [Oxybutynin Chloride Er] Other (See Comments)    froze the intestine  . Vesicare [Solifenacin Succinate] Other (See Comments)    Froze the intestine      Procedures This Admission:  1. 02/26/17 EPS, Dr. Lovena Le 1. EP study with isoproterenol demonstrating no inducible SVT, VT, atrial fibrillation, or atrial flutter, despite documented wide QRS tachycardia 2. Severe conduction system disease with left bundle branch block and an HV interval of 70 ms 3. History of syncope, unexplained, with baseline ECG and Baseline intervals suggestive of Stokes Adams attacks. 2.  Implantation of a MST dual chamber PPM on 02/26/17 by Dr Cheral Almas.  The patient received a Medtronic model 5076 active fixation pacing lead, serial number [id number], Medtronic RV/His bundle lead, #[id number], The Medtronic dual-chamber pacemaker, serial number [id number] There were no immediate post procedure complications. CXR on demonstrated no pneumothorax status post device implantation.   Brief HPI: Peter Marsh is a 56 y.o. male was referred to electrophysiology in the outpatient setting for evaluation of WCT and syncope. Known LBBB and prior hx of AFlutter  Dr. Lovena Le discussed EPS procedure, risks/beneofts as well as possibly need for PPM or loop pending the findings, the patient  wanted to proceed.  Hospital Course:  The patient was admitted and underwent EPS which disclosed no inducible arrhythmias, though severe conduction system disease and with syncope suggestove of stoke adams attacks, PPM was placed with details as outlined above.  The patient was monitored on telemetry overnight which demonstrated SR 70's.  Left chest was without hematoma or ecchymosis.  All EPS procedure sites (R IJ and R groin) were stable without bleeding or hematoma.  The device was interrogated and found to be functioning normally.  CXR was obtained and demonstrated no pneumothorax status post device implantation.  Wound care, arm mobility, and restrictions were reviewed with the patient.  There were no inducible arrhythmias  Including AFib or flutter, will stop his eliquis.  Will increase his lopressor for better BP control in d/w Dr. Einar Gip, and patient will follow up with him.  The patient was examined by Dr. Lovena Le and considered stable for discharge to home.    Physical Exam: Vitals:   02/26/17 2200 02/27/17 0018 02/27/17 0539 02/27/17 0742  BP: (!) 178/97 (!) 174/89 (!) 162/91 (!) 181/98  Pulse: 79 75 74 75  Resp: 18 (!) 22 18 18   Temp: 98.4 F (36.9 C) 98.1 F (36.7 C) 97.9 F (36.6 C) 97.9 F (36.6 C)  TempSrc: Oral Oral Oral Oral  SpO2: 97% 99% 96% 97%  Weight:   235 lb 9.6 oz (106.9 kg)   Height:        GEN- The patient is well appearing, alert and oriented x 3 today.   HEENT: normocephalic, atraumatic; sclera clear, conjunctiva pink; hearing intact; oropharynx clear; neck supple, no JVP Lungs- CTA b/l, normal work of breathing.  No wheezes, rales,  rhonchi Heart-  RRR, no murmurs, rubs or gallops, PMI not laterally displaced GI- soft, non-tender, non-distended Extremities- no clubbing, cyanosis, or edema; R IJ and R groin procedure sites are soft, non-tender, no hematoma no bleeding MS- no significant deformity or atrophy Skin- warm and dry, no rash or lesion, left chest  without hematoma/ecchymosis Psych- euthymic mood, full affect Neuro- no gross deficits   Labs:   Lab Results  Component Value Date   WBC 8.3 02/16/2017   HGB 12.6 (L) 02/16/2017   HCT 37.5 02/16/2017   MCV 85 02/16/2017   PLT 254 02/16/2017   No results for input(s): NA, K, CL, CO2, BUN, CREATININE, CALCIUM, PROT, BILITOT, ALKPHOS, ALT, AST, GLUCOSE in the last 168 hours.  Invalid input(s): LABALBU  Discharge Medications:  Allergies as of 02/27/2017      Reactions   Methylpyrrolidone Hives   froze the intestine   Niacin Other (See Comments)   Flushing, itching, tingling    Ciprofloxacin Other (See Comments)   Felt flushed   Oxybutynin Chloride [oxybutynin Chloride Er] Other (See Comments)   froze the intestine   Vesicare [solifenacin Succinate] Other (See Comments)   Froze the intestine      Medication List    STOP taking these medications   ELIQUIS 5 MG Tabs tablet Generic drug:  apixaban     TAKE these medications   albuterol 108 (90 Base) MCG/ACT inhaler Commonly known as:  PROVENTIL HFA;VENTOLIN HFA Inhale 2 puffs into the lungs every 6 (six) hours as needed for wheezing or shortness of breath.   budesonide-formoterol 80-4.5 MCG/ACT inhaler Commonly known as:  SYMBICORT Inhale 2 puffs into the lungs 2 (two) times daily.   buPROPion 150 MG 12 hr tablet Commonly known as:  WELLBUTRIN SR Take 150 mg by mouth 2 (two) times daily.   cholecalciferol 1000 units tablet Commonly known as:  VITAMIN D Take 1,000 Units by mouth daily.   hydrALAZINE 50 MG tablet Commonly known as:  APRESOLINE Take 50 mg by mouth 3 (three) times daily.   metoprolol tartrate 50 MG tablet Commonly known as:  LOPRESSOR Take 1 tablet (50 mg total) by mouth 2 (two) times daily. What changed:  medication strength  how much to take   montelukast 10 MG tablet Commonly known as:  SINGULAIR Take 10 mg by mouth at bedtime.   paliperidone 6 MG 24 hr tablet Commonly known as:   INVEGA Take 6 mg by mouth at bedtime.   pantoprazole 40 MG tablet Commonly known as:  PROTONIX Take 1 tablet (40 mg total) by mouth 2 (two) times daily. What changed:  when to take this       Disposition: Home Discharge Instructions    Diet - low sodium heart healthy    Complete by:  As directed    Increase activity slowly    Complete by:  As directed      Follow-up Information    [facility] Follow up on 03/13/2017.   Specialty:  Cardiology Why:  2;00PM, wound check visit Contact information: [address] [place]       Evans Lance, MD Follow up on 05/31/2017.   Specialty:  Cardiology Why:  9:30AM Contact information: [address] [place] [address] [phone]        Adrian Prows, MD Follow up.   Specialty:  Cardiology Why:  please call the office to make follow up appointment in  the next 3 weeks Contact information: [address] [place] [address] [phone]           Duration of Discharge Encounter: Greater than 30 minutes including physician time.  Venetia Night, PA-C 02/27/2017 8:55 AM  EP Attending  Patient seen and examined. PPM interogation under my direction demonstrates normal DDD PM function and CXR shows normal lead position with no pneumothorax. [place] for DC home. I think the risk of systemic anti-coagulation with no inducible atrial flutter (or fib) greater than any benefit as we have no clear cut evidence of atrial fib or flutter. For that reason will hold off on Eliquis. Usual device followup.  Mikle Bosworth.D.

## 2017-03-01 ENCOUNTER — Other Ambulatory Visit: Payer: Self-pay | Admitting: Internal Medicine

## 2017-03-13 ENCOUNTER — Ambulatory Visit: Payer: PPO

## 2017-03-14 ENCOUNTER — Ambulatory Visit (INDEPENDENT_AMBULATORY_CARE_PROVIDER_SITE_OTHER): Payer: PPO | Admitting: *Deleted

## 2017-03-14 DIAGNOSIS — R55 Syncope and collapse: Secondary | ICD-10-CM | POA: Diagnosis not present

## 2017-03-14 LAB — CUP PACEART INCLINIC DEVICE CHECK
Brady Statistic RA Percent Paced: 8.1 %
Brady Statistic RV Percent Paced: 3.4 %
Date Time Interrogation Session: 20181107104735
Implantable Lead Implant Date: 20181022
Implantable Lead Location: 753859
Implantable Lead Model: 5076
Lead Channel Impedance Value: 532 Ohm
Lead Channel Pacing Threshold Amplitude: 0.75 V
Lead Channel Sensing Intrinsic Amplitude: 10.6 mV
Lead Channel Sensing Intrinsic Amplitude: 4.8 mV
MDC IDC LEAD IMPLANT DT: 20181022
MDC IDC LEAD LOCATION: 753860
MDC IDC MSMT LEADCHNL RA PACING THRESHOLD PULSEWIDTH: 0.4 ms
MDC IDC MSMT LEADCHNL RV IMPEDANCE VALUE: 437 Ohm
MDC IDC MSMT LEADCHNL RV PACING THRESHOLD AMPLITUDE: 0.5 V
MDC IDC MSMT LEADCHNL RV PACING THRESHOLD PULSEWIDTH: 1 ms
MDC IDC PG IMPLANT DT: 20181022

## 2017-03-14 NOTE — Progress Notes (Signed)
Wound check appointment. Steri-strips removed. Wound without redness or edema. Incision edges approximated, wound well healed. Normal device function. Thresholds, sensing, and impedances consistent with implant measurements. Device programmed at 3.5V for extra safety margin until 3 month visit. 12 EKG lead appears Septal capture from 5.0V@1 .63ms until LOC at 0.25V. Histogram distribution appropriate for patient and level of activity. No mode switches or high ventricular rates noted. Patient educated about wound care, arm mobility, lifting restrictions. ROV 05/31/2017 w/ GT

## 2017-03-28 ENCOUNTER — Ambulatory Visit: Payer: PPO | Admitting: Internal Medicine

## 2017-04-10 DIAGNOSIS — Z950 Presence of cardiac pacemaker: Secondary | ICD-10-CM | POA: Diagnosis not present

## 2017-04-10 DIAGNOSIS — I4439 Other atrioventricular block: Secondary | ICD-10-CM | POA: Diagnosis not present

## 2017-04-10 DIAGNOSIS — I130 Hypertensive heart and chronic kidney disease with heart failure and stage 1 through stage 4 chronic kidney disease, or unspecified chronic kidney disease: Secondary | ICD-10-CM | POA: Diagnosis not present

## 2017-04-10 DIAGNOSIS — N183 Chronic kidney disease, stage 3 (moderate): Secondary | ICD-10-CM | POA: Diagnosis not present

## 2017-05-15 DIAGNOSIS — N184 Chronic kidney disease, stage 4 (severe): Secondary | ICD-10-CM | POA: Diagnosis not present

## 2017-05-15 DIAGNOSIS — Z6832 Body mass index (BMI) 32.0-32.9, adult: Secondary | ICD-10-CM | POA: Diagnosis not present

## 2017-05-15 DIAGNOSIS — E559 Vitamin D deficiency, unspecified: Secondary | ICD-10-CM | POA: Diagnosis not present

## 2017-05-15 DIAGNOSIS — N1330 Unspecified hydronephrosis: Secondary | ICD-10-CM | POA: Diagnosis not present

## 2017-05-15 DIAGNOSIS — I129 Hypertensive chronic kidney disease with stage 1 through stage 4 chronic kidney disease, or unspecified chronic kidney disease: Secondary | ICD-10-CM | POA: Diagnosis not present

## 2017-05-23 DIAGNOSIS — F250 Schizoaffective disorder, bipolar type: Secondary | ICD-10-CM | POA: Diagnosis not present

## 2017-05-31 ENCOUNTER — Encounter: Payer: PPO | Admitting: Internal Medicine

## 2017-07-20 ENCOUNTER — Ambulatory Visit (INDEPENDENT_AMBULATORY_CARE_PROVIDER_SITE_OTHER): Payer: Self-pay | Admitting: *Deleted

## 2017-07-20 DIAGNOSIS — R197 Diarrhea, unspecified: Secondary | ICD-10-CM | POA: Diagnosis not present

## 2017-07-20 DIAGNOSIS — R112 Nausea with vomiting, unspecified: Secondary | ICD-10-CM | POA: Diagnosis not present

## 2017-07-20 DIAGNOSIS — K259 Gastric ulcer, unspecified as acute or chronic, without hemorrhage or perforation: Secondary | ICD-10-CM | POA: Diagnosis not present

## 2017-07-20 DIAGNOSIS — I4892 Unspecified atrial flutter: Secondary | ICD-10-CM

## 2017-07-20 NOTE — Progress Notes (Signed)
Remote pacemaker transmission.   

## 2017-07-24 ENCOUNTER — Encounter: Payer: Self-pay | Admitting: Cardiology

## 2017-07-31 LAB — CUP PACEART REMOTE DEVICE CHECK
Battery Remaining Longevity: 157 mo
Battery Voltage: 3.17 V
Brady Statistic AP VS Percent: 2.53 %
Brady Statistic RA Percent Paced: 3.53 %
Date Time Interrogation Session: 20190315121718
Implantable Lead Implant Date: 20181022
Implantable Lead Implant Date: 20181022
Implantable Lead Location: 753859
Implantable Lead Model: 3830
Implantable Pulse Generator Implant Date: 20181022
Lead Channel Pacing Threshold Amplitude: 0.5 V
Lead Channel Pacing Threshold Pulse Width: 0.4 ms
Lead Channel Sensing Intrinsic Amplitude: 4.25 mV
Lead Channel Sensing Intrinsic Amplitude: 4.25 mV
Lead Channel Sensing Intrinsic Amplitude: 9.5 mV
Lead Channel Setting Pacing Amplitude: 3.5 V
MDC IDC LEAD LOCATION: 753860
MDC IDC MSMT LEADCHNL RA IMPEDANCE VALUE: 323 Ohm
MDC IDC MSMT LEADCHNL RA IMPEDANCE VALUE: 361 Ohm
MDC IDC MSMT LEADCHNL RV IMPEDANCE VALUE: 342 Ohm
MDC IDC MSMT LEADCHNL RV IMPEDANCE VALUE: 475 Ohm
MDC IDC MSMT LEADCHNL RV PACING THRESHOLD AMPLITUDE: 1 V
MDC IDC MSMT LEADCHNL RV PACING THRESHOLD PULSEWIDTH: 0.4 ms
MDC IDC MSMT LEADCHNL RV SENSING INTR AMPL: 9.5 mV
MDC IDC SET LEADCHNL RA PACING AMPLITUDE: 1.5 V
MDC IDC SET LEADCHNL RV PACING PULSEWIDTH: 1 ms
MDC IDC SET LEADCHNL RV SENSING SENSITIVITY: 1.2 mV
MDC IDC STAT BRADY AP VP PERCENT: 1.01 %
MDC IDC STAT BRADY AS VP PERCENT: 0.04 %
MDC IDC STAT BRADY AS VS PERCENT: 96.42 %
MDC IDC STAT BRADY RV PERCENT PACED: 1.06 %

## 2017-08-22 ENCOUNTER — Other Ambulatory Visit: Payer: Self-pay | Admitting: Physician Assistant

## 2017-09-05 DIAGNOSIS — K219 Gastro-esophageal reflux disease without esophagitis: Secondary | ICD-10-CM | POA: Diagnosis not present

## 2017-09-05 DIAGNOSIS — K861 Other chronic pancreatitis: Secondary | ICD-10-CM | POA: Diagnosis not present

## 2017-09-05 DIAGNOSIS — Z8719 Personal history of other diseases of the digestive system: Secondary | ICD-10-CM | POA: Diagnosis not present

## 2017-09-05 DIAGNOSIS — K2270 Barrett's esophagus without dysplasia: Secondary | ICD-10-CM | POA: Diagnosis not present

## 2017-09-05 DIAGNOSIS — Z86010 Personal history of colonic polyps: Secondary | ICD-10-CM | POA: Diagnosis not present

## 2017-09-05 DIAGNOSIS — I4892 Unspecified atrial flutter: Secondary | ICD-10-CM | POA: Diagnosis not present

## 2017-10-15 ENCOUNTER — Emergency Department (HOSPITAL_COMMUNITY): Payer: PPO

## 2017-10-15 ENCOUNTER — Other Ambulatory Visit: Payer: Self-pay | Admitting: Nephrology

## 2017-10-15 ENCOUNTER — Other Ambulatory Visit: Payer: Self-pay

## 2017-10-15 ENCOUNTER — Emergency Department (HOSPITAL_BASED_OUTPATIENT_CLINIC_OR_DEPARTMENT_OTHER)
Admit: 2017-10-15 | Discharge: 2017-10-15 | Disposition: A | Discharge status: Home / Self Care | Payer: PPO | Attending: Emergency Medicine | Admitting: Emergency Medicine

## 2017-10-15 ENCOUNTER — Emergency Department (HOSPITAL_COMMUNITY)
Admission: EM | Admit: 2017-10-15 | Discharge: 2017-10-15 | Disposition: A | Discharge status: Home / Self Care | Payer: PPO | Attending: Emergency Medicine | Admitting: Emergency Medicine

## 2017-10-15 ENCOUNTER — Encounter (HOSPITAL_COMMUNITY): Payer: Self-pay | Admitting: Emergency Medicine

## 2017-10-15 ENCOUNTER — Other Ambulatory Visit: Payer: PPO

## 2017-10-15 DIAGNOSIS — Z950 Presence of cardiac pacemaker: Secondary | ICD-10-CM | POA: Diagnosis not present

## 2017-10-15 DIAGNOSIS — R51 Headache: Secondary | ICD-10-CM | POA: Diagnosis not present

## 2017-10-15 DIAGNOSIS — M79672 Pain in left foot: Secondary | ICD-10-CM | POA: Diagnosis not present

## 2017-10-15 DIAGNOSIS — M79662 Pain in left lower leg: Secondary | ICD-10-CM | POA: Insufficient documentation

## 2017-10-15 DIAGNOSIS — N184 Chronic kidney disease, stage 4 (severe): Secondary | ICD-10-CM | POA: Insufficient documentation

## 2017-10-15 DIAGNOSIS — R5383 Other fatigue: Secondary | ICD-10-CM | POA: Insufficient documentation

## 2017-10-15 DIAGNOSIS — M79671 Pain in right foot: Secondary | ICD-10-CM | POA: Insufficient documentation

## 2017-10-15 DIAGNOSIS — R05 Cough: Secondary | ICD-10-CM | POA: Insufficient documentation

## 2017-10-15 DIAGNOSIS — I10 Essential (primary) hypertension: Secondary | ICD-10-CM

## 2017-10-15 DIAGNOSIS — M79661 Pain in right lower leg: Secondary | ICD-10-CM | POA: Diagnosis not present

## 2017-10-15 DIAGNOSIS — R600 Localized edema: Secondary | ICD-10-CM | POA: Diagnosis not present

## 2017-10-15 DIAGNOSIS — Z79899 Other long term (current) drug therapy: Secondary | ICD-10-CM | POA: Diagnosis not present

## 2017-10-15 DIAGNOSIS — R0789 Other chest pain: Secondary | ICD-10-CM | POA: Diagnosis not present

## 2017-10-15 DIAGNOSIS — I129 Hypertensive chronic kidney disease with stage 1 through stage 4 chronic kidney disease, or unspecified chronic kidney disease: Secondary | ICD-10-CM | POA: Insufficient documentation

## 2017-10-15 DIAGNOSIS — R0602 Shortness of breath: Secondary | ICD-10-CM | POA: Insufficient documentation

## 2017-10-15 DIAGNOSIS — R609 Edema, unspecified: Secondary | ICD-10-CM

## 2017-10-15 DIAGNOSIS — J45909 Unspecified asthma, uncomplicated: Secondary | ICD-10-CM | POA: Diagnosis not present

## 2017-10-15 DIAGNOSIS — Z87891 Personal history of nicotine dependence: Secondary | ICD-10-CM | POA: Insufficient documentation

## 2017-10-15 LAB — BASIC METABOLIC PANEL
Anion gap: 10 (ref 5–15)
BUN: 11 mg/dL (ref 6–20)
CHLORIDE: 104 mmol/L (ref 101–111)
CO2: 24 mmol/L (ref 22–32)
Calcium: 9 mg/dL (ref 8.9–10.3)
Creatinine, Ser: 1.76 mg/dL — ABNORMAL HIGH (ref 0.61–1.24)
GFR calc Af Amer: 47 mL/min — ABNORMAL LOW (ref >60)
GFR calc non Af Amer: 41 mL/min — ABNORMAL LOW (ref >60)
Glucose, Bld: 104 mg/dL — ABNORMAL HIGH (ref 65–99)
POTASSIUM: 3.7 mmol/L (ref 3.5–5.1)
SODIUM: 138 mmol/L (ref 135–145)

## 2017-10-15 LAB — CBC WITH DIFFERENTIAL/PLATELET
Basophils Absolute: 0 10*3/uL (ref 0.0–0.1)
Basophils Relative: 0 %
EOS ABS: 0.6 10*3/uL (ref 0.0–0.7)
Eosinophils Relative: 6 %
HEMATOCRIT: 39.3 % (ref 39.0–52.0)
HEMOGLOBIN: 12.9 g/dL — AB (ref 13.0–17.0)
LYMPHS ABS: 2.3 10*3/uL (ref 0.7–4.0)
Lymphocytes Relative: 26 %
MCH: 28.5 pg (ref 26.0–34.0)
MCHC: 32.8 g/dL (ref 30.0–36.0)
MCV: 86.9 fL (ref 78.0–100.0)
Monocytes Absolute: 0.7 10*3/uL (ref 0.1–1.0)
Monocytes Relative: 8 %
NEUTROS ABS: 5.3 10*3/uL (ref 1.7–7.7)
NEUTROS PCT: 60 %
Platelets: 300 10*3/uL (ref 150–400)
RBC: 4.52 MIL/uL (ref 4.22–5.81)
RDW: 12.9 % (ref 11.5–15.5)
WBC: 8.9 10*3/uL (ref 4.0–10.5)

## 2017-10-15 LAB — CBG MONITORING, ED: Glucose-Capillary: 96 mg/dL (ref 65–99)

## 2017-10-15 LAB — D-DIMER, QUANTITATIVE (NOT AT ARMC): D DIMER QUANT: 1.36 ug{FEU}/mL — AB (ref 0.00–0.50)

## 2017-10-15 LAB — I-STAT TROPONIN, ED
TROPONIN I, POC: 0 ng/mL (ref 0.00–0.08)
TROPONIN I, POC: 0.01 ng/mL (ref 0.00–0.08)

## 2017-10-15 LAB — BRAIN NATRIURETIC PEPTIDE: B Natriuretic Peptide: 81.2 pg/mL (ref 0.0–100.0)

## 2017-10-15 MED ORDER — IOPAMIDOL (ISOVUE-370) INJECTION 76%
INTRAVENOUS | Status: AC
  Filled 2017-10-15: qty 100

## 2017-10-15 MED ORDER — IOPAMIDOL (ISOVUE-370) INJECTION 76%
100.0000 mL | Freq: Once | INTRAVENOUS | Status: AC | PRN
  Administered 2017-10-15: 80 mL via INTRAVENOUS

## 2017-10-15 MED ORDER — ASPIRIN 81 MG PO CHEW
324.0000 mg | CHEWABLE_TABLET | Freq: Once | ORAL | Status: AC
  Administered 2017-10-15: 324 mg via ORAL
  Filled 2017-10-15: qty 4

## 2017-10-15 MED ORDER — HYDRALAZINE HCL 20 MG/ML IJ SOLN
5.0000 mg | Freq: Once | INTRAMUSCULAR | Status: AC
  Administered 2017-10-15: 5 mg via INTRAVENOUS
  Filled 2017-10-15: qty 1

## 2017-10-15 MED ORDER — METOPROLOL TARTRATE 5 MG/5ML IV SOLN
5.0000 mg | Freq: Once | INTRAVENOUS | Status: AC
  Administered 2017-10-15: 5 mg via INTRAVENOUS
  Filled 2017-10-15: qty 5

## 2017-10-15 MED ORDER — METOPROLOL TARTRATE 25 MG PO TABS
50.0000 mg | ORAL_TABLET | Freq: Once | ORAL | Status: AC
  Administered 2017-10-15: 50 mg via ORAL
  Filled 2017-10-15: qty 2

## 2017-10-15 NOTE — ED Notes (Signed)
Pt ambulated in hallway, tolerating well. Oxygen stats from 94-97%.

## 2017-10-15 NOTE — ED Triage Notes (Signed)
Pt verbalizes hypertension; reading 201/105.

## 2017-10-15 NOTE — Progress Notes (Signed)
Bilateral lower extremity venous duplex completed. There is no obvious evidence of DVT, superficial thrombosis, or Baker's cyst. Peter Marsh,RVS 10/15/2017 8:51 pm

## 2017-10-15 NOTE — Discharge Instructions (Signed)
Your work up today was reassuring.  The cause of your symptoms is unclear and may be multifactorial.   I spoke to Dr. Virgina Jock he recommends increasing dose of hydralazine to 75 mg three times a day.  Please follow up with cardiology as soon as able.

## 2017-10-15 NOTE — ED Provider Notes (Signed)
[place] DEPT Provider Note   CSN: [id number] Arrival date & time: 10/15/17  1314     History   Chief Complaint Chief Complaint  Patient presents with  . Hypertension    HPI Peter Marsh is a 57 y.o. male with history of hypertension, asthma, renal insufficiency, wide QRS tachycardia status post pacemaker is here for elevated blood pressure for the last month.  Reports baseline blood pressure usually 140/80s however today 185/104, 194/106, 201/105.  Called nephrologist at [facility] Dr. Madelon Lips  who advised he come to the ER.  He has been compliant with metoprolol and hydralazine and took them this morning.  In the last month he has had associated headache, intermittent chest pain, shortness of breath, fatigue, cough and increased leg swelling.  Headache is described as mild to moderate, intermittent, global relieved temporarily with Tylenol, he had a brief episode of blurred vision lasting 1 to 2 hours while driving several days ago and this resolved on its own.  His chest pain is described as central, sharp, intermittent usually notices it when he is laying down but not on exertion, nonradiating, nonpleuritic.  Endorses exertional shortness of breath with walking approximately 20 feet, rest makes it better.  Has had bilateral leg swelling for several weeks left greater than right however recently this has become worse typically at night and better with elevation, he does have mild soreness to bilateral feet and calves.  Has noticed his intermittently waking up in the morning coughing and feeling like he is choking.  Quit tobacco in 2010.  Denies EtOH use.  Uncle died from heart attack at age 27s.  HPI  Past Medical History:  Diagnosis Date  . Asthma    uses inhalers   . Bilateral carotid bruits   . Chronic kidney disease    bladder interstial cystitis   . Chronic kidney disease (CKD), stage IV (severe) ([name])    follow[facility] by Dr. Joelyn Oms at  [facility]  . Depression   . GERD (gastroesophageal reflux disease)   . History of stomach ulcers 2001  . Hypertension   . LBBB (left bundle branch block)   . Lower extremity edema   . Mild intermittent asthma without complication   . Mixed hyperlipidemia   . Paroxysmal atrial flutter ([place])   . PONV (postoperative nausea and vomiting)   . Schizophrenia [facility])     Patient Active Problem List   Diagnosis Date Noted  . Wide QRS ventricular tachycardia ([place]) 02/26/2017  . Coffee ground emesis 09/16/2016  . CKD (chronic kidney disease), stage III ([place]) 09/16/2016  . Schizophrenia ([place]) 09/16/2016  . Paroxysmal atrial fibrillation ([place]) 09/16/2016  . Upper GI bleed 09/16/2016  . Cough variant asthma 02/09/2016  . Dyspnea 02/09/2016  . BRBPR (bright red blood per rectum) 06/18/2013  . Herniated lumbar intervertebral disc 04/20/2011    Past Surgical History:  Procedure Laterality Date  . APPENDECTOMY    . ELECTROPHYSIOLOGY STUDY N/A 02/26/2017   Procedure: ELECTROPHYSIOLOGY STUDY;  Surgeon: Evans Lance, MD;  Location: [place] [facility];  Service: Cardiovascular;  Laterality: N/A;  . ESOPHAGOGASTRODUODENOSCOPY (EGD) WITH PROPOFOL N/A 09/18/2016   Procedure: ESOPHAGOGASTRODUODENOSCOPY (EGD) WITH PROPOFOL;  Surgeon: Ronnette Juniper, MD;  Location: [place];  Service: Gastroenterology;  Laterality: N/A;  . LUMBAR LAMINECTOMY/DECOMPRESSION MICRODISCECTOMY  04/19/2011   Procedure: LUMBAR LAMINECTOMY/DECOMPRESSION MICRODISCECTOMY;  Surgeon: Tobi Bastos;  Location: [facility];  Service: Orthopedics;  Laterality: Left;  Hemi LAminectomy/Microdiscectomy Lumbar four  - Lumbar  five  on the Left (X-Ray)  . PACEMAKER IMPLANT N/A 02/26/2017   Procedure: PACEMAKER IMPLANT;  Surgeon: Evans Lance, MD;  Location: [place] [facility];  Service: Cardiovascular;  Laterality: N/A;  . TONSILLECTOMY          Home Medications    Prior to Admission medications   Medication Sig Start Date End  Date Taking? Authorizing Provider  albuterol (PROVENTIL HFA;VENTOLIN HFA) 108 (90 BASE) MCG/ACT inhaler Inhale 2 puffs into the lungs every 6 (six) hours as needed for wheezing or shortness of breath.    Yes [provider]  buPROPion (WELLBUTRIN SR) 150 MG 12 hr tablet Take 150 mg by mouth 2 (two) times daily.    Yes [provider]  hydrALAZINE (APRESOLINE) 50 MG tablet Take 50 mg by mouth 3 (three) times daily. 04/11/16  Yes [provider]  metoprolol tartrate (LOPRESSOR) 50 MG tablet TAKE 1 TABLET(50 MG) BY MOUTH TWICE DAILY 08/22/17  Yes Baldwin Jamaica, PA-C  montelukast (SINGULAIR) 10 MG tablet Take 10 mg by mouth at bedtime. 01/17/16  Yes [provider]  paliperidone (INVEGA) 6 MG 24 hr tablet Take 6 mg by mouth at bedtime.   Yes [provider]  pantoprazole (PROTONIX) 40 MG tablet Take 1 tablet (40 mg total) by mouth 2 (two) times daily. 09/19/16  Yes Aline August, MD  sucralfate (CARAFATE) 1 g tablet TK 1 T PO QID OES 07/20/17  Yes [provider]  budesonide-formoterol (SYMBICORT) 80-4.5 MCG/ACT inhaler Inhale 2 puffs into the lungs 2 (two) times daily. Patient not taking: Reported on 10/15/2017 02/09/16   Tanda Rockers, MD    Family History Family History  Problem Relation Age of Onset  . High blood pressure Mother   . Alzheimer's disease Father     Social History Social History   Tobacco Use  . Smoking status: Former Smoker    Packs/day: 1.00    Years: 30.00    Pack years: 30.00    Last attempt to quit: 05/08/2008    Years since quitting: 9.4  . Smokeless tobacco: Never Used  Substance Use Topics  . Alcohol use: No  . Drug use: No     Allergies   Methylpyrrolidone; Niacin; Ciprofloxacin; Norvasc [amlodipine besylate]; Oxybutynin chloride [oxybutynin chloride er]; and Vesicare [solifenacin succinate]   Review of Systems Review of Systems  Constitutional: Positive for fatigue.  Eyes: Positive for visual  disturbance (resolved).  Respiratory: Positive for cough and shortness of breath.   Cardiovascular: Positive for chest pain and leg swelling.  Neurological: Positive for headaches.     Physical Exam Updated Vital Signs BP (!) 129/98 (BP Location: Right Arm)   Pulse 62   Temp 98.3 F (36.8 C) (Oral)   Resp 14   SpO2 96%   Physical Exam  Constitutional: He is oriented to person, place, and time. He appears well-developed and well-nourished.  No distress. Non toxic.   HENT:  Head: Normocephalic and atraumatic.  Nose: Nose normal.  Moist mucous membranes. Oropharynx and tonsils normal.   Eyes: Pupils are equal, round, and reactive to light. EOM are normal.  Neck: Neck supple.  Cardiovascular: Normal rate and regular rhythm.  RRR.  2+ DP, radial and femoral pulses bilaterally Trace LE edema from ankles to mid tib/fib slightly L>R.  Bilateral calf tenderness.  Pulmonary/Chest: Effort normal. No respiratory distress. He has wheezes.  Faint wheezing to lower lobes posteriorly. No crackles. No orthopnea with HOB flat   Abdominal: Soft.  Bowel sounds are normal. There is no tenderness.  No obvious distention.   Neurological: He is alert and oriented to person, place, and time.  Alert and oriented to self, place, time and event.  Speech is fluent without obvious dysarthria or dysphasia. Strength 5/5 with hand grip and ankle F/E.   Sensation to light touch intact in hands and feet. No truncal sway. No pronator drift. No leg drop.  Normal finger-to-nose and finger tapping.  CN I and VIII not tested. CN II-XII grossly intact bilaterally.   Skin: Skin is warm and dry. Capillary refill takes less than 2 seconds.  Psychiatric: He has a normal mood and affect. His behavior is normal. Judgment and thought content normal.     ED Treatments / Results  Labs (all labs ordered are listed, but only abnormal results are displayed) Labs Reviewed  BASIC METABOLIC PANEL - Abnormal; Notable for  the following components:      Result Value   Glucose, Bld 104 (*)    Creatinine, Ser 1.76 (*)    GFR calc non Af Amer 41 (*)    GFR calc Af Amer 47 (*)    All other components within normal limits  CBC WITH DIFFERENTIAL/PLATELET - Abnormal; Notable for the following components:   Hemoglobin 12.9 (*)    All other components within normal limits  D-DIMER, QUANTITATIVE (NOT AT [facility]) - Abnormal; Notable for the following components:   D-Dimer, Quant 1.36 (*)    All other components within normal limits  BRAIN NATRIURETIC PEPTIDE  CBG MONITORING, ED  I-STAT TROPONIN, ED  I-STAT TROPONIN, ED  I-STAT TROPONIN, ED    EKG None  Radiology Dg Chest 2 View  Result Date: 10/15/2017 CLINICAL DATA:  Shortness of breath for 1 month. EXAM: CHEST - 2 VIEW COMPARISON:  02/27/2017 FINDINGS: The cardiac silhouette, mediastinal and hilar contours are within normal limits and stable. The pacer wires are stable. Slightly low lung volumes with vascular crowding and streaky atelectasis but no infiltrates, edema or effusions. No pneumothorax. The bony thorax is intact. IMPRESSION: No acute cardiopulmonary findings. Electronically Signed   By: Marijo Sanes M.D.   On: 10/15/2017 16:45   Ct Head Wo Contrast  Result Date: 10/15/2017 CLINICAL DATA:  Hypertension and headache five months.  Refractory. EXAM: CT HEAD WITHOUT CONTRAST TECHNIQUE: Contiguous axial images were obtained from the base of the skull through the vertex without intravenous contrast. COMPARISON:  MRI 04/13/2005 FINDINGS: Brain: Ventricles and cisterns are within normal. Prominent CSF spaces for patient of this age most notable over the frontal region, although stable. Suggestion of mild chronic ischemic microvascular disease. No mass, mass effect, shift of midline structures or acute hemorrhage. No evidence of acute infarction. Vascular: No hyperdense vessel or unexpected calcification. Skull: Normal. Negative for fracture or focal lesion.  Sinuses/Orbits: No acute finding. Other: None. IMPRESSION: No acute findings. Stable atrophic changes most prominent over the frontal lobes. Mild chronic ischemic microvascular disease. Electronically Signed   By: Marin Olp M.D.   On: 10/15/2017 17:36   Ct Angio Chest Pe W And/or Wo Contrast  Result Date: 10/15/2017 CLINICAL DATA:  Shortness of breath and possible PE. Positive D-dimer. EXAM: CT ANGIOGRAPHY CHEST WITH CONTRAST TECHNIQUE: Multidetector CT imaging of the chest was performed using the standard protocol during bolus administration of intravenous contrast. Multiplanar CT image reconstructions and MIPs were obtained to evaluate the vascular anatomy. CONTRAST:  55mL ISOVUE-370 IOPAMIDOL (ISOVUE-370) INJECTION 76% COMPARISON:  None. FINDINGS: Cardiovascular: --Pulmonary arteries: Contrast injection  is sufficient to demonstrate satisfactory opacification of the pulmonary arteries to the segmental level. There is no pulmonary embolus. The main pulmonary artery is within normal limits for size. --Aorta: Limited opacification of the aorta due to bolus timing optimization for the pulmonary arteries. Conventional 3 vessel aortic branching pattern. The aortic course and caliber are normal. There is mild aortic atherosclerosis. --Heart: Normal size. No pericardial effusion. Mediastinum/Nodes: No mediastinal, hilar or axillary lymphadenopathy. The visualized thyroid and thoracic esophageal course are unremarkable. Lungs/Pleura: Biapical scarring. No pleural effusion or pneumothorax. Upper Abdomen: Contrast bolus timing is not optimized for evaluation of the abdominal organs. Numerous pancreatic parenchymal calcifications. Musculoskeletal: No chest wall abnormality. No acute or significant osseous findings. Review of the MIP images confirms the above findings. IMPRESSION: 1. No pulmonary embolus or other acute thoracic abnormality. 2. Numerous pancreatic parenchymal calcifications. This may indicate chronic  pancreatitis. No acute inflammation. Aortic Atherosclerosis (ICD10-I70.0). Electronically Signed   By: Ulyses Jarred M.D.   On: 10/15/2017 18:24    Procedures Procedures (including critical care time)  Medications Ordered in ED Medications  iopamidol (ISOVUE-370) 76 % injection (has no administration in time range)  aspirin chewable tablet 324 mg (324 mg Oral Given 10/15/17 1652)  hydrALAZINE (APRESOLINE) injection 5 mg (5 mg Intravenous Given 10/15/17 1755)  iopamidol (ISOVUE-370) 76 % injection 100 mL (80 mLs Intravenous Contrast Given 10/15/17 1742)  metoprolol tartrate (LOPRESSOR) tablet 50 mg (50 mg Oral Given 10/15/17 1844)  metoprolol tartrate (LOPRESSOR) injection 5 mg (5 mg Intravenous Given 10/15/17 1844)  hydrALAZINE (APRESOLINE) injection 5 mg (5 mg Intravenous Given 10/15/17 1845)     Initial Impression / Assessment and Plan / ED Course  I have reviewed the triage vital signs and the nursing notes.  Pertinent labs & imaging results that were available during my care of the patient were reviewed by me and considered in my medical decision making (see chart for details).  Clinical Course as of Oct 16 2314  Mon Oct 15, 2017  1700 Creatinine(!): 1.76 [CG]  1701 GFR, Est Non African American(!): 41 [CG]  1731 D-Dimer, Quant(!): 1.36 [CG]  1831 IMPRESSION: 1. No pulmonary embolus or other acute thoracic abnormality. 2. Numerous pancreatic parenchymal calcifications. This may indicate chronic pancreatitis. No acute inflammation.    CT Angio Chest PE W and/or Wo Contrast [CG]  1831 IMPRESSION: No acute findings.  Stable atrophic changes most prominent over the frontal lobes. Mild chronic ischemic microvascular disease.    CT Head Wo Contrast [CG]  2038 Vascular ultrasound in room    [CG]  2048 Vascular ultrasounds negative for DVT   [CG]  2134 Spoke to DR [facility] (cardiology) who has reviewed patient's chart. Pt had stress test 2018 that was normal, last echo ef 60%.  He is comfortable with patient going home from cardiac/CP.  Consider increasing hydralazine 75 mg TID.  Cardiology office will f/u with pt to be seen this week.    [CG]  2314 BP(!): 129/98 [CG]    Clinical Course User Index [CG] Kinnie Feil, PA-C   57 year old here with gradually worsening blood pressure readings despite compliance with antihypertensives.  Reports associated intermittent chest pain, shortness of breath, leg swelling, fatigue, PND, headache and one episode of vision changes over the last 1 month.  On exam he has trace pitting edema slightly worse on the left, otherwise reassuring exam.  Concern for hypertensive emergency, new onset CHF, ACS.  Given asymmetric lower extremity edema DVT/PE also on differential.  Will obtain  screening labs, IV hydralazine and reassess.  2050: Labs, imaging, EKG reviewed and remarkable for creatinine 1.76, GFR 41 not too different from his baseline.  D-dimer 1.36, CT angio and bilateral vascular US negative for DVT or PE.  Troponin 0 0.00 > 0.01. BNP WNL. CXR w/o edema, infiltrate. His BP has been persistently elevated but slightly better than initial.  He ambulated with normal SpO2.  Given age, risk, cardiac risk factors and reported symptoms in the last month, will consult cardiology for recommendations regarding disposition.  Final Clinical Impressions(s) / ED Diagnoses   2315: spoke to cardiology who deems pt appropriate for discharge with close f/u this week given recent work up as documented above, recommending hydralazine dose adjustment. Pt's BP now WNL. Will discharge. Pt aware he needs to confirm cardiology f/u within 2-3 days. Strict return precautions given.  Final diagnoses:  Elevated blood pressure reading in office with diagnosis of hypertension  Atypical chest pain    ED Discharge Orders    None       Arlean Hopping 10/15/17 2316    Milton Ferguson, MD 10/16/17 1110

## 2017-10-18 DIAGNOSIS — Z950 Presence of cardiac pacemaker: Secondary | ICD-10-CM | POA: Diagnosis not present

## 2017-10-18 DIAGNOSIS — I130 Hypertensive heart and chronic kidney disease with heart failure and stage 1 through stage 4 chronic kidney disease, or unspecified chronic kidney disease: Secondary | ICD-10-CM | POA: Diagnosis not present

## 2017-10-18 DIAGNOSIS — I10 Essential (primary) hypertension: Secondary | ICD-10-CM | POA: Diagnosis not present

## 2017-10-18 DIAGNOSIS — I4439 Other atrioventricular block: Secondary | ICD-10-CM | POA: Diagnosis not present

## 2017-10-22 ENCOUNTER — Ambulatory Visit (INDEPENDENT_AMBULATORY_CARE_PROVIDER_SITE_OTHER): Payer: PPO | Admitting: *Deleted

## 2017-10-22 DIAGNOSIS — R55 Syncope and collapse: Secondary | ICD-10-CM

## 2017-10-22 NOTE — Progress Notes (Signed)
Remote pacemaker transmission.   

## 2017-10-23 DIAGNOSIS — J449 Chronic obstructive pulmonary disease, unspecified: Secondary | ICD-10-CM | POA: Diagnosis not present

## 2017-10-23 DIAGNOSIS — E559 Vitamin D deficiency, unspecified: Secondary | ICD-10-CM | POA: Diagnosis not present

## 2017-10-23 DIAGNOSIS — I4892 Unspecified atrial flutter: Secondary | ICD-10-CM | POA: Diagnosis not present

## 2017-10-23 DIAGNOSIS — K2270 Barrett's esophagus without dysplasia: Secondary | ICD-10-CM | POA: Diagnosis not present

## 2017-10-23 DIAGNOSIS — F29 Unspecified psychosis not due to a substance or known physiological condition: Secondary | ICD-10-CM | POA: Diagnosis not present

## 2017-10-23 DIAGNOSIS — E538 Deficiency of other specified B group vitamins: Secondary | ICD-10-CM | POA: Diagnosis not present

## 2017-10-23 DIAGNOSIS — I129 Hypertensive chronic kidney disease with stage 1 through stage 4 chronic kidney disease, or unspecified chronic kidney disease: Secondary | ICD-10-CM | POA: Diagnosis not present

## 2017-10-23 LAB — CUP PACEART REMOTE DEVICE CHECK
Brady Statistic AP VP Percent: 29.06 %
Brady Statistic AP VS Percent: 10.05 %
Brady Statistic AS VS Percent: 60.87 %
Brady Statistic RV Percent Paced: 29.09 %
Implantable Lead Implant Date: 20181022
Implantable Lead Implant Date: 20181022
Implantable Lead Location: 753860
Implantable Pulse Generator Implant Date: 20181022
Lead Channel Impedance Value: 304 Ohm
Lead Channel Impedance Value: 323 Ohm
Lead Channel Impedance Value: 380 Ohm
Lead Channel Impedance Value: 437 Ohm
Lead Channel Pacing Threshold Amplitude: 1.125 V
Lead Channel Sensing Intrinsic Amplitude: 12.875 mV
Lead Channel Sensing Intrinsic Amplitude: 12.875 mV
Lead Channel Setting Pacing Amplitude: 1.5 V
Lead Channel Setting Pacing Amplitude: 3.5 V
Lead Channel Setting Sensing Sensitivity: 1.2 mV
MDC IDC LEAD LOCATION: 753859
MDC IDC MSMT BATTERY REMAINING LONGEVITY: 140 mo
MDC IDC MSMT BATTERY VOLTAGE: 3.11 V
MDC IDC MSMT LEADCHNL RA PACING THRESHOLD AMPLITUDE: 0.5 V
MDC IDC MSMT LEADCHNL RA PACING THRESHOLD PULSEWIDTH: 0.4 ms
MDC IDC MSMT LEADCHNL RA SENSING INTR AMPL: 4.375 mV
MDC IDC MSMT LEADCHNL RA SENSING INTR AMPL: 4.375 mV
MDC IDC MSMT LEADCHNL RV PACING THRESHOLD PULSEWIDTH: 0.4 ms
MDC IDC SESS DTM: 20190614201507
MDC IDC SET LEADCHNL RV PACING PULSEWIDTH: 1 ms
MDC IDC STAT BRADY AS VP PERCENT: 0.02 %
MDC IDC STAT BRADY RA PERCENT PACED: 39.06 %

## 2017-10-30 DIAGNOSIS — N183 Chronic kidney disease, stage 3 (moderate): Secondary | ICD-10-CM | POA: Diagnosis not present

## 2017-10-30 DIAGNOSIS — N1330 Unspecified hydronephrosis: Secondary | ICD-10-CM | POA: Diagnosis not present

## 2017-10-30 DIAGNOSIS — I129 Hypertensive chronic kidney disease with stage 1 through stage 4 chronic kidney disease, or unspecified chronic kidney disease: Secondary | ICD-10-CM | POA: Diagnosis not present

## 2017-10-30 DIAGNOSIS — N184 Chronic kidney disease, stage 4 (severe): Secondary | ICD-10-CM | POA: Diagnosis not present

## 2017-10-30 DIAGNOSIS — E785 Hyperlipidemia, unspecified: Secondary | ICD-10-CM | POA: Diagnosis not present

## 2017-11-07 DIAGNOSIS — Z45018 Encounter for adjustment and management of other part of cardiac pacemaker: Secondary | ICD-10-CM | POA: Diagnosis not present

## 2017-11-07 DIAGNOSIS — Z950 Presence of cardiac pacemaker: Secondary | ICD-10-CM | POA: Diagnosis not present

## 2017-11-07 DIAGNOSIS — I130 Hypertensive heart and chronic kidney disease with heart failure and stage 1 through stage 4 chronic kidney disease, or unspecified chronic kidney disease: Secondary | ICD-10-CM | POA: Diagnosis not present

## 2017-11-07 DIAGNOSIS — I4439 Other atrioventricular block: Secondary | ICD-10-CM | POA: Diagnosis not present

## 2017-11-14 DIAGNOSIS — H5213 Myopia, bilateral: Secondary | ICD-10-CM | POA: Diagnosis not present

## 2017-11-14 DIAGNOSIS — H35033 Hypertensive retinopathy, bilateral: Secondary | ICD-10-CM | POA: Diagnosis not present

## 2017-11-19 DIAGNOSIS — F250 Schizoaffective disorder, bipolar type: Secondary | ICD-10-CM | POA: Diagnosis not present

## 2017-11-22 DIAGNOSIS — I10 Essential (primary) hypertension: Secondary | ICD-10-CM | POA: Diagnosis not present

## 2018-01-09 DIAGNOSIS — I129 Hypertensive chronic kidney disease with stage 1 through stage 4 chronic kidney disease, or unspecified chronic kidney disease: Secondary | ICD-10-CM | POA: Diagnosis not present

## 2018-01-09 DIAGNOSIS — E785 Hyperlipidemia, unspecified: Secondary | ICD-10-CM | POA: Diagnosis not present

## 2018-01-09 DIAGNOSIS — N1330 Unspecified hydronephrosis: Secondary | ICD-10-CM | POA: Diagnosis not present

## 2018-01-09 DIAGNOSIS — N183 Chronic kidney disease, stage 3 (moderate): Secondary | ICD-10-CM | POA: Diagnosis not present

## 2018-01-16 DIAGNOSIS — N183 Chronic kidney disease, stage 3 (moderate): Secondary | ICD-10-CM | POA: Diagnosis not present

## 2018-01-16 DIAGNOSIS — I10 Essential (primary) hypertension: Secondary | ICD-10-CM | POA: Diagnosis not present

## 2018-01-21 ENCOUNTER — Ambulatory Visit: Payer: PPO | Admitting: *Deleted

## 2018-01-21 DIAGNOSIS — R55 Syncope and collapse: Secondary | ICD-10-CM

## 2018-01-21 NOTE — Progress Notes (Addendum)
error 

## 2018-02-06 DIAGNOSIS — I4439 Other atrioventricular block: Secondary | ICD-10-CM | POA: Diagnosis not present

## 2018-02-06 DIAGNOSIS — I130 Hypertensive heart and chronic kidney disease with heart failure and stage 1 through stage 4 chronic kidney disease, or unspecified chronic kidney disease: Secondary | ICD-10-CM | POA: Diagnosis not present

## 2018-02-06 DIAGNOSIS — R0602 Shortness of breath: Secondary | ICD-10-CM | POA: Diagnosis not present

## 2018-02-06 DIAGNOSIS — Z950 Presence of cardiac pacemaker: Secondary | ICD-10-CM | POA: Diagnosis not present

## 2018-02-12 DIAGNOSIS — Z950 Presence of cardiac pacemaker: Secondary | ICD-10-CM | POA: Diagnosis not present

## 2018-02-12 DIAGNOSIS — Z45018 Encounter for adjustment and management of other part of cardiac pacemaker: Secondary | ICD-10-CM | POA: Diagnosis not present

## 2018-02-12 DIAGNOSIS — I4439 Other atrioventricular block: Secondary | ICD-10-CM | POA: Diagnosis not present

## 2018-04-24 DIAGNOSIS — D631 Anemia in chronic kidney disease: Secondary | ICD-10-CM | POA: Diagnosis not present

## 2018-04-24 DIAGNOSIS — I129 Hypertensive chronic kidney disease with stage 1 through stage 4 chronic kidney disease, or unspecified chronic kidney disease: Secondary | ICD-10-CM | POA: Diagnosis not present

## 2018-04-24 DIAGNOSIS — N189 Chronic kidney disease, unspecified: Secondary | ICD-10-CM | POA: Diagnosis not present

## 2018-04-24 DIAGNOSIS — E559 Vitamin D deficiency, unspecified: Secondary | ICD-10-CM | POA: Diagnosis not present

## 2018-04-24 DIAGNOSIS — N1330 Unspecified hydronephrosis: Secondary | ICD-10-CM | POA: Diagnosis not present

## 2018-04-24 DIAGNOSIS — N183 Chronic kidney disease, stage 3 (moderate): Secondary | ICD-10-CM | POA: Diagnosis not present

## 2018-04-24 DIAGNOSIS — I10 Essential (primary) hypertension: Secondary | ICD-10-CM | POA: Diagnosis not present

## 2018-04-29 ENCOUNTER — Other Ambulatory Visit: Payer: Self-pay | Admitting: Nephrology

## 2018-04-29 DIAGNOSIS — N183 Chronic kidney disease, stage 3 unspecified: Secondary | ICD-10-CM

## 2018-04-29 DIAGNOSIS — N1330 Unspecified hydronephrosis: Secondary | ICD-10-CM

## 2018-04-29 DIAGNOSIS — N189 Chronic kidney disease, unspecified: Secondary | ICD-10-CM

## 2018-04-29 DIAGNOSIS — I10 Essential (primary) hypertension: Secondary | ICD-10-CM

## 2018-04-29 DIAGNOSIS — D631 Anemia in chronic kidney disease: Secondary | ICD-10-CM

## 2018-04-29 DIAGNOSIS — E559 Vitamin D deficiency, unspecified: Secondary | ICD-10-CM

## 2018-04-30 ENCOUNTER — Ambulatory Visit: Payer: PPO | Admitting: Internal Medicine

## 2018-05-06 ENCOUNTER — Ambulatory Visit: Payer: PPO | Admitting: Internal Medicine

## 2018-05-09 DIAGNOSIS — J449 Chronic obstructive pulmonary disease, unspecified: Secondary | ICD-10-CM | POA: Diagnosis not present

## 2018-05-09 DIAGNOSIS — F29 Unspecified psychosis not due to a substance or known physiological condition: Secondary | ICD-10-CM | POA: Diagnosis not present

## 2018-05-09 DIAGNOSIS — I129 Hypertensive chronic kidney disease with stage 1 through stage 4 chronic kidney disease, or unspecified chronic kidney disease: Secondary | ICD-10-CM | POA: Diagnosis not present

## 2018-05-09 DIAGNOSIS — E559 Vitamin D deficiency, unspecified: Secondary | ICD-10-CM | POA: Diagnosis not present

## 2018-05-09 DIAGNOSIS — E538 Deficiency of other specified B group vitamins: Secondary | ICD-10-CM | POA: Diagnosis not present

## 2018-05-09 DIAGNOSIS — I4892 Unspecified atrial flutter: Secondary | ICD-10-CM | POA: Diagnosis not present

## 2018-05-09 DIAGNOSIS — K2270 Barrett's esophagus without dysplasia: Secondary | ICD-10-CM | POA: Diagnosis not present

## 2018-05-13 ENCOUNTER — Ambulatory Visit
Admission: RE | Admit: 2018-05-13 | Discharge: 2018-05-13 | Disposition: A | Discharge status: Home / Self Care | Payer: PPO | Source: Ambulatory Visit | Attending: Nephrology | Admitting: Nephrology

## 2018-05-13 ENCOUNTER — Ambulatory Visit: Payer: PPO | Admitting: Internal Medicine

## 2018-05-13 DIAGNOSIS — N1330 Unspecified hydronephrosis: Secondary | ICD-10-CM

## 2018-05-13 DIAGNOSIS — I10 Essential (primary) hypertension: Secondary | ICD-10-CM

## 2018-05-13 DIAGNOSIS — N189 Chronic kidney disease, unspecified: Secondary | ICD-10-CM | POA: Diagnosis not present

## 2018-05-13 DIAGNOSIS — N183 Chronic kidney disease, stage 3 unspecified: Secondary | ICD-10-CM

## 2018-05-13 DIAGNOSIS — E559 Vitamin D deficiency, unspecified: Secondary | ICD-10-CM

## 2018-05-13 DIAGNOSIS — D631 Anemia in chronic kidney disease: Secondary | ICD-10-CM

## 2018-05-14 DIAGNOSIS — Z950 Presence of cardiac pacemaker: Secondary | ICD-10-CM | POA: Diagnosis not present

## 2018-05-14 DIAGNOSIS — Z45018 Encounter for adjustment and management of other part of cardiac pacemaker: Secondary | ICD-10-CM | POA: Diagnosis not present

## 2018-05-14 DIAGNOSIS — I4439 Other atrioventricular block: Secondary | ICD-10-CM | POA: Diagnosis not present

## 2018-05-21 ENCOUNTER — Other Ambulatory Visit: Payer: Self-pay | Admitting: Nephrology

## 2018-05-21 DIAGNOSIS — N281 Cyst of kidney, acquired: Secondary | ICD-10-CM

## 2018-05-22 ENCOUNTER — Telehealth: Payer: Self-pay | Admitting: Internal Medicine

## 2018-05-22 DIAGNOSIS — F200 Paranoid schizophrenia: Secondary | ICD-10-CM | POA: Diagnosis not present

## 2018-05-22 NOTE — Telephone Encounter (Addendum)
Patient does have an MRI-conditional system (pacemaker and leads). Patient will need to ensure he has his [employer] ID card. Discussed with industry--MRI with MRI-conditional device can typically be performed at any local hospital.  LMOV[facility] for [facility] requesting call back to the DC. Gave direct number for return call.

## 2018-05-22 NOTE — Telephone Encounter (Signed)
  Matt @ [facility] needs to know if Mr Guin is okay to have an MRI with the pacemaker that he has and what location should he go to have it

## 2018-05-23 NOTE — Telephone Encounter (Signed)
Spoke w/ Matt and informed him that pt has and MRI safe device. Informed him that pt will need to bring his [employer] ID w/ him to the appt. Matt verbalized understanding.

## 2018-05-28 ENCOUNTER — Telehealth: Payer: Self-pay | Admitting: Cardiology

## 2018-05-28 NOTE — Telephone Encounter (Signed)
Patient called requesting his device information. He is going for an MRI and the facility doing the MRI needed the information. Provided the patient w/ the information and the number to [employer] so he can order a pt ID card. Pt verbalized understanding.

## 2018-05-29 ENCOUNTER — Other Ambulatory Visit (HOSPITAL_COMMUNITY): Payer: Self-pay | Admitting: Nephrology

## 2018-05-29 DIAGNOSIS — N281 Cyst of kidney, acquired: Secondary | ICD-10-CM

## 2018-06-12 ENCOUNTER — Ambulatory Visit (HOSPITAL_COMMUNITY)
Admission: RE | Admit: 2018-06-12 | Discharge: 2018-06-12 | Disposition: A | Discharge status: Home / Self Care | Payer: PPO | Source: Ambulatory Visit | Attending: Nephrology | Admitting: Nephrology

## 2018-06-12 DIAGNOSIS — N289 Disorder of kidney and ureter, unspecified: Secondary | ICD-10-CM | POA: Diagnosis not present

## 2018-06-12 DIAGNOSIS — N281 Cyst of kidney, acquired: Secondary | ICD-10-CM | POA: Diagnosis not present

## 2018-06-24 ENCOUNTER — Encounter (HOSPITAL_COMMUNITY): Payer: Self-pay | Admitting: Emergency Medicine

## 2018-06-24 ENCOUNTER — Other Ambulatory Visit: Payer: Self-pay | Admitting: Cardiology

## 2018-06-24 ENCOUNTER — Emergency Department (HOSPITAL_COMMUNITY): Payer: PPO

## 2018-06-24 ENCOUNTER — Other Ambulatory Visit: Payer: Self-pay

## 2018-06-24 ENCOUNTER — Observation Stay (HOSPITAL_COMMUNITY)
Admission: EM | Admit: 2018-06-24 | Discharge: 2018-06-25 | Disposition: A | Discharge status: Home / Self Care | Payer: PPO | Attending: Internal Medicine | Admitting: Internal Medicine

## 2018-06-24 DIAGNOSIS — F209 Schizophrenia, unspecified: Secondary | ICD-10-CM | POA: Insufficient documentation

## 2018-06-24 DIAGNOSIS — K219 Gastro-esophageal reflux disease without esophagitis: Secondary | ICD-10-CM | POA: Insufficient documentation

## 2018-06-24 DIAGNOSIS — R5381 Other malaise: Secondary | ICD-10-CM | POA: Insufficient documentation

## 2018-06-24 DIAGNOSIS — R509 Fever, unspecified: Secondary | ICD-10-CM | POA: Diagnosis not present

## 2018-06-24 DIAGNOSIS — Z23 Encounter for immunization: Secondary | ICD-10-CM | POA: Diagnosis not present

## 2018-06-24 DIAGNOSIS — R0789 Other chest pain: Principal | ICD-10-CM | POA: Insufficient documentation

## 2018-06-24 DIAGNOSIS — J45991 Cough variant asthma: Secondary | ICD-10-CM

## 2018-06-24 DIAGNOSIS — R000 Tachycardia, unspecified: Secondary | ICD-10-CM | POA: Diagnosis not present

## 2018-06-24 DIAGNOSIS — R079 Chest pain, unspecified: Secondary | ICD-10-CM

## 2018-06-24 DIAGNOSIS — I10 Essential (primary) hypertension: Secondary | ICD-10-CM

## 2018-06-24 DIAGNOSIS — Z950 Presence of cardiac pacemaker: Secondary | ICD-10-CM | POA: Insufficient documentation

## 2018-06-24 DIAGNOSIS — R51 Headache: Secondary | ICD-10-CM | POA: Diagnosis not present

## 2018-06-24 DIAGNOSIS — I209 Angina pectoris, unspecified: Secondary | ICD-10-CM

## 2018-06-24 DIAGNOSIS — N1832 Chronic kidney disease, stage 3b: Secondary | ICD-10-CM | POA: Diagnosis present

## 2018-06-24 DIAGNOSIS — I4892 Unspecified atrial flutter: Secondary | ICD-10-CM | POA: Insufficient documentation

## 2018-06-24 DIAGNOSIS — R0600 Dyspnea, unspecified: Secondary | ICD-10-CM | POA: Insufficient documentation

## 2018-06-24 DIAGNOSIS — N183 Chronic kidney disease, stage 3 unspecified: Secondary | ICD-10-CM | POA: Diagnosis present

## 2018-06-24 DIAGNOSIS — I129 Hypertensive chronic kidney disease with stage 1 through stage 4 chronic kidney disease, or unspecified chronic kidney disease: Secondary | ICD-10-CM

## 2018-06-24 DIAGNOSIS — I480 Paroxysmal atrial fibrillation: Secondary | ICD-10-CM

## 2018-06-24 DIAGNOSIS — R0609 Other forms of dyspnea: Secondary | ICD-10-CM | POA: Diagnosis not present

## 2018-06-24 DIAGNOSIS — R61 Generalized hyperhidrosis: Secondary | ICD-10-CM | POA: Insufficient documentation

## 2018-06-24 DIAGNOSIS — J45909 Unspecified asthma, uncomplicated: Secondary | ICD-10-CM | POA: Diagnosis not present

## 2018-06-24 DIAGNOSIS — Z6832 Body mass index (BMI) 32.0-32.9, adult: Secondary | ICD-10-CM | POA: Diagnosis not present

## 2018-06-24 DIAGNOSIS — Z888 Allergy status to other drugs, medicaments and biological substances status: Secondary | ICD-10-CM | POA: Insufficient documentation

## 2018-06-24 DIAGNOSIS — Z7951 Long term (current) use of inhaled steroids: Secondary | ICD-10-CM | POA: Insufficient documentation

## 2018-06-24 DIAGNOSIS — Z87891 Personal history of nicotine dependence: Secondary | ICD-10-CM | POA: Insufficient documentation

## 2018-06-24 DIAGNOSIS — N184 Chronic kidney disease, stage 4 (severe): Secondary | ICD-10-CM | POA: Insufficient documentation

## 2018-06-24 DIAGNOSIS — Z79899 Other long term (current) drug therapy: Secondary | ICD-10-CM | POA: Insufficient documentation

## 2018-06-24 DIAGNOSIS — Z8249 Family history of ischemic heart disease and other diseases of the circulatory system: Secondary | ICD-10-CM | POA: Insufficient documentation

## 2018-06-24 DIAGNOSIS — R05 Cough: Secondary | ICD-10-CM | POA: Diagnosis not present

## 2018-06-24 DIAGNOSIS — I081 Rheumatic disorders of both mitral and tricuspid valves: Secondary | ICD-10-CM | POA: Diagnosis not present

## 2018-06-24 DIAGNOSIS — R42 Dizziness and giddiness: Secondary | ICD-10-CM | POA: Insufficient documentation

## 2018-06-24 DIAGNOSIS — Z881 Allergy status to other antibiotic agents status: Secondary | ICD-10-CM | POA: Insufficient documentation

## 2018-06-24 DIAGNOSIS — R0602 Shortness of breath: Secondary | ICD-10-CM | POA: Diagnosis not present

## 2018-06-24 DIAGNOSIS — I441 Atrioventricular block, second degree: Secondary | ICD-10-CM | POA: Insufficient documentation

## 2018-06-24 DIAGNOSIS — R809 Proteinuria, unspecified: Secondary | ICD-10-CM | POA: Insufficient documentation

## 2018-06-24 DIAGNOSIS — I447 Left bundle-branch block, unspecified: Secondary | ICD-10-CM | POA: Insufficient documentation

## 2018-06-24 DIAGNOSIS — E669 Obesity, unspecified: Secondary | ICD-10-CM | POA: Insufficient documentation

## 2018-06-24 DIAGNOSIS — M7989 Other specified soft tissue disorders: Secondary | ICD-10-CM | POA: Diagnosis not present

## 2018-06-24 DIAGNOSIS — I472 Ventricular tachycardia: Secondary | ICD-10-CM

## 2018-06-24 DIAGNOSIS — I208 Other forms of angina pectoris: Secondary | ICD-10-CM | POA: Diagnosis not present

## 2018-06-24 DIAGNOSIS — F329 Major depressive disorder, single episode, unspecified: Secondary | ICD-10-CM | POA: Insufficient documentation

## 2018-06-24 HISTORY — DX: Conduction disorder, unspecified: I45.9

## 2018-06-24 HISTORY — DX: Chest pain, unspecified: R07.9

## 2018-06-24 LAB — CBC
HCT: 43.1 % (ref 39.0–52.0)
HCT: 40.5 % (ref 39.0–52.0)
Hemoglobin: 13.3 g/dL (ref 13.0–17.0)
Hemoglobin: 12.5 g/dL — ABNORMAL LOW (ref 13.0–17.0)
MCH: 27.7 pg (ref 26.0–34.0)
MCH: 28.5 pg (ref 26.0–34.0)
MCHC: 30.9 g/dL (ref 30.0–36.0)
MCHC: 30.9 g/dL (ref 30.0–36.0)
MCV: 89.6 fL (ref 80.0–100.0)
MCV: 92.5 fL (ref 80.0–100.0)
Platelets: 242 10*3/uL (ref 150–400)
Platelets: 218 10*3/uL (ref 150–400)
RBC: 4.81 MIL/uL (ref 4.22–5.81)
RBC: 4.38 MIL/uL (ref 4.22–5.81)
RDW: 12.8 % (ref 11.5–15.5)
RDW: 12.9 % (ref 11.5–15.5)
WBC: 7.9 10*3/uL (ref 4.0–10.5)
WBC: 6.9 10*3/uL (ref 4.0–10.5)
nRBC: 0 % (ref 0.0–0.2)
nRBC: 0 % (ref 0.0–0.2)

## 2018-06-24 LAB — URINALYSIS, ROUTINE W REFLEX MICROSCOPIC
Bilirubin Urine: NEGATIVE
Glucose, UA: NEGATIVE mg/dL
Hgb urine dipstick: NEGATIVE
Ketones, ur: NEGATIVE mg/dL
LEUKOCYTE UA: NEGATIVE
Nitrite: NEGATIVE
Protein, ur: 100 mg/dL — AB
Specific Gravity, Urine: 1.018 (ref 1.005–1.030)
pH: 6 (ref 5.0–8.0)

## 2018-06-24 LAB — LIPID PANEL
Cholesterol: 168 mg/dL (ref 0–200)
HDL: 23 mg/dL — ABNORMAL LOW (ref >40)
LDL Cholesterol: 114 mg/dL — ABNORMAL HIGH (ref 0–99)
Total CHOL/HDL Ratio: 7.3 RATIO
Triglycerides: 157 mg/dL — ABNORMAL HIGH (ref <150)
VLDL: 31 mg/dL (ref 0–40)

## 2018-06-24 LAB — COMPREHENSIVE METABOLIC PANEL
ALT: 18 U/L (ref 0–44)
AST: 19 U/L (ref 15–41)
Albumin: 4.2 g/dL (ref 3.5–5.0)
Alkaline Phosphatase: 72 U/L (ref 38–126)
Anion gap: 7 (ref 5–15)
BUN: 18 mg/dL (ref 6–20)
CALCIUM: 9 mg/dL (ref 8.9–10.3)
CHLORIDE: 106 mmol/L (ref 98–111)
CO2: 24 mmol/L (ref 22–32)
Creatinine, Ser: 1.82 mg/dL — ABNORMAL HIGH (ref 0.61–1.24)
GFR calc Af Amer: 46 mL/min — ABNORMAL LOW (ref >60)
GFR calc non Af Amer: 40 mL/min — ABNORMAL LOW (ref >60)
Glucose, Bld: 111 mg/dL — ABNORMAL HIGH (ref 70–99)
Potassium: 4.7 mmol/L (ref 3.5–5.1)
Sodium: 137 mmol/L (ref 135–145)
Total Bilirubin: 0.3 mg/dL (ref 0.3–1.2)
Total Protein: 7.4 g/dL (ref 6.5–8.1)

## 2018-06-24 LAB — I-STAT TROPONIN, ED: Troponin i, poc: 0 ng/mL (ref 0.00–0.08)

## 2018-06-24 LAB — TSH: TSH: 1.673 u[IU]/mL (ref 0.350–4.500)

## 2018-06-24 LAB — CREATININE, SERUM
Creatinine, Ser: 1.8 mg/dL — ABNORMAL HIGH (ref 0.61–1.24)
GFR calc Af Amer: 47 mL/min — ABNORMAL LOW (ref >60)
GFR calc non Af Amer: 40 mL/min — ABNORMAL LOW (ref >60)

## 2018-06-24 LAB — BRAIN NATRIURETIC PEPTIDE: B Natriuretic Peptide: 89.4 pg/mL (ref 0.0–100.0)

## 2018-06-24 LAB — TROPONIN I: Troponin I: <0.03 ng/mL (ref <0.03)

## 2018-06-24 LAB — LIPASE, BLOOD: LIPASE: 32 U/L (ref 11–51)

## 2018-06-24 MED ORDER — SODIUM CHLORIDE 0.9% FLUSH: 3.0000 mL | Freq: Once | INTRAVENOUS | Status: DC

## 2018-06-24 MED ORDER — PALIPERIDONE ER 6 MG PO TB24
6.0000 mg | ORAL_TABLET | Freq: Every day | ORAL | Status: DC
  Administered 2018-06-24: 6 mg via ORAL
  Filled 2018-06-24: qty 1

## 2018-06-24 MED ORDER — FLUTICASONE FUROATE-VILANTEROL 100-25 MCG/INH IN AEPB
1.0000 | INHALATION_SPRAY | Freq: Every day | RESPIRATORY_TRACT | Status: DC
  Administered 2018-06-24 – 2018-06-25 (×2): 1 via RESPIRATORY_TRACT
  Filled 2018-06-24: qty 28

## 2018-06-24 MED ORDER — BUPROPION HCL ER (SR) 150 MG PO TB12
150.0000 mg | ORAL_TABLET | Freq: Two times a day (BID) | ORAL | Status: DC
  Administered 2018-06-24 – 2018-06-25 (×3): 150 mg via ORAL
  Filled 2018-06-24 (×3): qty 1

## 2018-06-24 MED ORDER — CLONIDINE HCL 0.2 MG PO TABS
0.2000 mg | ORAL_TABLET | Freq: Two times a day (BID) | ORAL | Status: DC
  Administered 2018-06-24 – 2018-06-25 (×2): 0.2 mg via ORAL
  Filled 2018-06-24 (×2): qty 1

## 2018-06-24 MED ORDER — INFLUENZA VAC SPLIT QUAD 0.5 ML IM SUSY
0.5000 mL | PREFILLED_SYRINGE | INTRAMUSCULAR | Status: AC
  Administered 2018-06-25: 0.5 mL via INTRAMUSCULAR
  Filled 2018-06-24: qty 0.5

## 2018-06-24 MED ORDER — SUCRALFATE 1 G PO TABS
1.0000 g | ORAL_TABLET | Freq: Four times a day (QID) | ORAL | Status: DC
  Administered 2018-06-24 – 2018-06-25 (×5): 1 g via ORAL
  Filled 2018-06-24 (×5): qty 1

## 2018-06-24 MED ORDER — ONDANSETRON HCL 4 MG/2ML IJ SOLN: 4.0000 mg | Freq: Four times a day (QID) | INTRAMUSCULAR | Status: DC | PRN

## 2018-06-24 MED ORDER — ONDANSETRON HCL 4 MG PO TABS: 4.0000 mg | ORAL_TABLET | Freq: Four times a day (QID) | ORAL | Status: DC | PRN

## 2018-06-24 MED ORDER — BENAZEPRIL HCL 10 MG PO TABS
10.0000 mg | ORAL_TABLET | Freq: Every day | ORAL | Status: DC
  Administered 2018-06-24 – 2018-06-25 (×2): 10 mg via ORAL
  Filled 2018-06-24: qty 1
  Filled 2018-06-24 (×2): qty 0.5

## 2018-06-24 MED ORDER — ALBUTEROL SULFATE (2.5 MG/3ML) 0.083% IN NEBU: 2.5000 mg | INHALATION_SOLUTION | RESPIRATORY_TRACT | Status: DC | PRN

## 2018-06-24 MED ORDER — PANTOPRAZOLE SODIUM 40 MG PO TBEC
40.0000 mg | DELAYED_RELEASE_TABLET | Freq: Two times a day (BID) | ORAL | Status: DC
  Administered 2018-06-24 – 2018-06-25 (×3): 40 mg via ORAL
  Filled 2018-06-24 (×3): qty 1

## 2018-06-24 MED ORDER — POLYETHYLENE GLYCOL 3350 17 G PO PACK
17.0000 g | PACK | Freq: Every day | ORAL | Status: DC
  Administered 2018-06-24: 17 g via ORAL
  Filled 2018-06-24 (×3): qty 1

## 2018-06-24 MED ORDER — ONDANSETRON 4 MG PO TBDP: 4.0000 mg | ORAL_TABLET | Freq: Once | ORAL | Status: DC | PRN

## 2018-06-24 MED ORDER — OXYCODONE HCL 5 MG PO TABS: 5.0000 mg | ORAL_TABLET | ORAL | Status: DC | PRN

## 2018-06-24 MED ORDER — ENOXAPARIN SODIUM 40 MG/0.4ML ~~LOC~~ SOLN
40.0000 mg | SUBCUTANEOUS | Status: DC
  Administered 2018-06-24: 40 mg via SUBCUTANEOUS
  Filled 2018-06-24 (×2): qty 0.4

## 2018-06-24 MED ORDER — ALBUTEROL SULFATE (2.5 MG/3ML) 0.083% IN NEBU
2.5000 mg | INHALATION_SOLUTION | Freq: Every day | RESPIRATORY_TRACT | Status: DC
  Administered 2018-06-25: 2.5 mg via RESPIRATORY_TRACT
  Filled 2018-06-24: qty 3

## 2018-06-24 MED ORDER — ACETAMINOPHEN 650 MG RE SUPP: 650.0000 mg | Freq: Four times a day (QID) | RECTAL | Status: DC | PRN

## 2018-06-24 MED ORDER — MONTELUKAST SODIUM 10 MG PO TABS
10.0000 mg | ORAL_TABLET | Freq: Every day | ORAL | Status: DC
  Administered 2018-06-24: 10 mg via ORAL
  Filled 2018-06-24: qty 1

## 2018-06-24 MED ORDER — HYDRALAZINE HCL 50 MG PO TABS
50.0000 mg | ORAL_TABLET | Freq: Three times a day (TID) | ORAL | Status: DC
  Administered 2018-06-24 – 2018-06-25 (×4): 50 mg via ORAL
  Filled 2018-06-24 (×4): qty 1

## 2018-06-24 MED ORDER — ALBUTEROL SULFATE (2.5 MG/3ML) 0.083% IN NEBU
2.5000 mg | INHALATION_SOLUTION | Freq: Four times a day (QID) | RESPIRATORY_TRACT | Status: DC
  Administered 2018-06-24: 2.5 mg via RESPIRATORY_TRACT
  Filled 2018-06-24: qty 3

## 2018-06-24 MED ORDER — ASPIRIN 81 MG PO CHEW
324.0000 mg | CHEWABLE_TABLET | Freq: Once | ORAL | Status: AC
  Administered 2018-06-24: 324 mg via ORAL
  Filled 2018-06-24: qty 4

## 2018-06-24 MED ORDER — METOPROLOL TARTRATE 50 MG PO TABS
100.0000 mg | ORAL_TABLET | Freq: Two times a day (BID) | ORAL | Status: DC
  Administered 2018-06-24 – 2018-06-25 (×3): 100 mg via ORAL
  Filled 2018-06-24 (×3): qty 2

## 2018-06-24 MED ORDER — ACETAMINOPHEN 325 MG PO TABS: 650.0000 mg | ORAL_TABLET | Freq: Four times a day (QID) | ORAL | Status: DC | PRN

## 2018-06-24 NOTE — ED Provider Notes (Signed)
TIME SEEN: 4:45 AM  CHIEF COMPLAINT: Chest pain, nausea, dizziness, shortness of breath  HPI: Patient is a 58 year old male with history of chronic kidney disease, hypertension, obesity, paroxysmal atrial flutter, [employer] dual-chamber pacemaker secondary to unexplained syncope, left bundle branch block, with intracardiac ECG evidence of severe conduction system disease who presents to the emergency department with complaints of chest discomfort diffusely across his chest but more on the right side of his chest that he describes as a pressure, shortness of breath, dizziness, palpitations where he feels his heart is beating fast, nausea and sweats.  States these have been ongoing since the 12th but progressively worsening.  Was initially very hypertensive in the emergency department but this is improving.  Reports compliance with his blood pressure medications.  Currently asymptomatic.  ROS: See HPI Constitutional: no fever  Eyes: no drainage  ENT: no runny nose   Cardiovascular:   chest pain  Resp:  SOB  GI: no vomiting GU: no dysuria Integumentary: no rash  Allergy: no hives  Musculoskeletal: no leg swelling  Neurological: no slurred speech ROS otherwise negative  PAST MEDICAL HISTORY/PAST SURGICAL HISTORY:  Past Medical History:  Diagnosis Date  . Asthma    uses inhalers   . Bilateral carotid bruits   . Chronic kidney disease    bladder interstial cystitis   . Chronic kidney disease (CKD), stage IV (severe) ([name])    [facility]  . Depression   . GERD (gastroesophageal reflux disease)   . History of stomach ulcers 2001  . Hypertension   . LBBB (left bundle branch block)   . Lower extremity edema   . Mild intermittent asthma without complication   . Mixed hyperlipidemia   . Paroxysmal atrial flutter ([place])   . PONV (postoperative nausea and vomiting)   . Schizophrenia ([place])     MEDICATIONS:  Prior to Admission medications   Medication Sig  Start Date End Date Taking? Authorizing Provider  albuterol (PROVENTIL HFA;VENTOLIN HFA) 108 (90 BASE) MCG/ACT inhaler Inhale 2 puffs into the lungs every 6 (six) hours as needed for wheezing or shortness of breath.     [provider]  budesonide-formoterol (SYMBICORT) 80-4.5 MCG/ACT inhaler Inhale 2 puffs into the lungs 2 (two) times daily. Patient not taking: Reported on 10/15/2017 02/09/16   Tanda Rockers, MD  buPROPion [facility] SR) 150 MG 12 hr tablet Take 150 mg by mouth 2 (two) times daily.     [provider]  hydrALAZINE (APRESOLINE) 50 MG tablet Take 50 mg by mouth 3 (three) times daily. 04/11/16   [provider]  metoprolol tartrate (LOPRESSOR) 50 MG tablet TAKE 1 TABLET(50 MG) BY MOUTH TWICE DAILY 08/22/17   Baldwin Jamaica, PA-C  montelukast (SINGULAIR) 10 MG tablet Take 10 mg by mouth at bedtime. 01/17/16   [provider]  paliperidone (INVEGA) 6 MG 24 hr tablet Take 6 mg by mouth at bedtime.    [provider]  pantoprazole (PROTONIX) 40 MG tablet Take 1 tablet (40 mg total) by mouth 2 (two) times daily. 09/19/16   Aline August, MD  sucralfate (CARAFATE) 1 g tablet TK 1 T PO QID OES 07/20/17   [provider]    ALLERGIES:  Allergies  Allergen Reactions  . Methylpyrrolidone Hives    froze the intestine  . Niacin Other (See Comments) and Nausea And Vomiting    Flushing, itching, tingling   . Norvasc [Amlodipine Besylate]     Swollen Feet  .  Oxybutynin Chloride [Oxybutynin Chloride Er] Other (See Comments)    froze the intestine  . Vesicare [Solifenacin Succinate] Other (See Comments)    Froze the intestine   . Ciprofloxacin Other (See Comments) and Rash    Felt flushed Unknown   . Oxybutynin Rash    unknown  . Solifenacin Rash    unknown    SOCIAL HISTORY:  Social History   Tobacco Use  . Smoking status: Former Smoker    Packs/day: 1.00    Years: 30.00    Pack years: 30.00    Last attempt to quit:  05/08/2008    Years since quitting: 10.1  . Smokeless tobacco: Never Used  Substance Use Topics  . Alcohol use: No    FAMILY HISTORY: Family History  Problem Relation Age of Onset  . High blood pressure Mother   . Alzheimer's disease Father     EXAM: BP (!) 208/105 (BP Location: Right Arm)   Pulse 63   Temp (!) 97.5 F (36.4 C) (Oral)   Resp 20   Ht 6' (1.829 m)   Wt 113.4 kg   SpO2 99%   BMI 33.91 kg/m  CONSTITUTIONAL: Alert and oriented and responds appropriately to questions.  Chronically ill-appearing, obese HEAD: Normocephalic EYES: Conjunctivae clear, pupils appear equal, EOMI ENT: normal nose; moist mucous membranes NECK: Supple, no meningismus, no nuchal rigidity, no LAD  CARD: RRR; S1 and S2 appreciated; no murmurs, no clicks, no rubs, no gallops RESP: Normal chest excursion without splinting or tachypnea; breath sounds clear and equal bilaterally; no wheezes, no rhonchi, no rales, no hypoxia or respiratory distress, speaking full sentences ABD/GI: Normal bowel sounds; non-distended; soft, non-tender, no rebound, no guarding, no peritoneal signs, no hepatosplenomegaly BACK:  The back appears normal and is non-tender to palpation, there is no CVA tenderness EXT: Normal ROM in all joints; non-tender to palpation; no edema; normal capillary refill; no cyanosis, no calf tenderness or swelling    SKIN: Normal color for age and race; warm; no rash NEURO: Moves all extremities equally PSYCH: The patient's mood and manner are appropriate. Grooming and personal hygiene are appropriate.  MEDICAL DECISION MAKING: Patient here with complaints of chest pain, shortness of breath, nausea, dizziness, diaphoresis.  Has multiple risk factors for ACS including obesity, hypertension, previous tobacco use.  Will obtain cardiac labs.  EKG shows paced rhythm.  Will interrogate pacemaker.  Anticipate admission.  Currently asymptomatic.  Will give aspirin.  ED PROGRESS:   5:10 AM  D/w  [place] from [employer].  No events since 06/12/18.  Battery life normal.   5:45 AM  Pt's blood pressure continues to improve.  Now in the 150s/80s.  Still asymptomatic.  Troponin negative.  Chest x-ray shows no acute abnormality.  Will discuss with medicine for admission for chest pain rule out.   6:19 AM Discussed patient's case with hospitalist, Dr. Hal Hope.  I have recommended admission and patient (and family if present) agree with this plan. Admitting physician will place admission orders.   I reviewed all nursing notes, vitals, pertinent previous records, EKGs, lab and urine results, imaging (as available).     EKG Interpretation  Date/Time:  Monday June 24 2018 04:31:24 EST Ventricular Rate:  65 PR Interval:    QRS Duration: 170 QT Interval:  449 QTC Calculation: 467 R Axis:   75 Text Interpretation:  Atrial-paced rhythm No significant change since last tracing Confirmed by Whitnee Orzel, Cyril Mourning [phone]) on 06/24/2018 4:41:18 AM  Janissa Bertram, Delice Bison, DO 06/24/18 [phone]

## 2018-06-24 NOTE — H&P (Signed)
History and Physical    Peter Marsh DOB: 1961/04/17 DOA: 06/24/2018  PCP: Marda Stalker, PA-C  Patient coming from: home  I have personally briefly reviewed patient's old medical records in [place]  Chief Complaint: lightheadedness, dyspnea, cp, diaphoresis  HPI: Peter Marsh is Peter Marsh 58 y.o. male with medical history significant of asthma, chronic kidney disease, GERD, hypertension, syncope status post pacemaker placement, left bundle branch block, Peter Marsh flutter presenting with about 1 week of lightheadedness, shortness of breath, sweating and exertional chest pain.    Patient notes that his symptoms started about 1 week ago.  He states that he was given an MRI and his pacemaker was "disconnected".  He notes that his symptoms started with abdominal comfort that he describes as being all over.  He describes it as achy, coming and going.  He notes that he has Peter Marsh history of interstitial cystitis.  He typically bloats when he eats.  He subsequently developed lightheadedness, he describes his feeling like he was going to pass out at times.  In addition he noticed sweating.  He describes night sweats for the past few nights.  He notes that he is noticed some exertional chest pain.  The first time was when he was walking across the parking lot.  He describes this as right-sided chest tightness.  The next time that he noticed this was when he was in the parking garage, again describes this as right-sided chest tightness.  He notes subjective fevers, chills, cough.  He notes shortness of breath in the morning and at night as well as with exertion.  He notes some swelling in his legs as well.  He notes that he has had frontal headaches as well.  He denies smoking or drinking.  He denies Peter Marsh history of heart attack or stroke.  He has Peter Marsh family history of heart attack in his uncle.    ED Course: Labs, EKG, CXR.  D/w [place] from [place] who noted no events.  Admitted for CP r/o.    Review of Systems: As per HPI otherwise 10 point review of systems negative.   Past Medical History:  Diagnosis Date  . Asthma    uses inhalers   . Bilateral carotid bruits   . Chronic kidney disease    bladder interstial cystitis   . Chronic kidney disease (CKD), stage IV (severe) ([name])    followed by Dr. Joelyn Oms at [facility]  . Depression   . GERD (gastroesophageal reflux disease)   . History of stomach ulcers 2001  . Hypertension   . LBBB (left bundle branch block)   . Lower extremity edema   . Mild intermittent asthma without complication   . Mixed hyperlipidemia   . Paroxysmal atrial flutter ([place])   . PONV (postoperative nausea and vomiting)   . Schizophrenia [facility])     Past Surgical History:  Procedure Laterality Date  . APPENDECTOMY    . ELECTROPHYSIOLOGY STUDY N/Nylani Michetti 02/26/2017   Procedure: ELECTROPHYSIOLOGY STUDY;  Surgeon: Evans Lance, MD;  Location: [place] [facility];  Service: Cardiovascular;  Laterality: N/Jamis Kryder;  . ESOPHAGOGASTRODUODENOSCOPY (EGD) WITH PROPOFOL N/Zoeya Gramajo 09/18/2016   Procedure: ESOPHAGOGASTRODUODENOSCOPY (EGD) WITH PROPOFOL;  Surgeon: Ronnette Juniper, MD;  Location: [place];  Service: Gastroenterology;  Laterality: N/Aowyn Rozeboom;  . LUMBAR LAMINECTOMY/DECOMPRESSION MICRODISCECTOMY  04/19/2011   Procedure: LUMBAR LAMINECTOMY/DECOMPRESSION MICRODISCECTOMY;  Surgeon: Tobi Bastos;  Location: [facility];  Service: Orthopedics;  Laterality: Left;  Hemi LAminectomy/Microdiscectomy Lumbar four  - Lumbar five  on  the Left (X-Ray)  . PACEMAKER IMPLANT N/Jhene Westmoreland 02/26/2017   Procedure: PACEMAKER IMPLANT;  Surgeon: Evans Lance, MD;  Location: [place] [facility];  Service: Cardiovascular;  Laterality: N/Meggen Spaziani;  . TONSILLECTOMY       reports that he quit smoking about 10 years ago. He has Peter Marsh 30.00 pack-year smoking history. He has never used smokeless tobacco. He reports that he does not drink alcohol or use drugs.  Allergies  Allergen Reactions  . Methylpyrrolidone Hives     froze the intestine  . Niacin Other (See Comments) and Nausea And Vomiting    Flushing, itching, tingling   . Norvasc [Amlodipine Besylate]     Swollen Feet  . Oxybutynin Chloride [Oxybutynin Chloride Er] Other (See Comments)    froze the intestine  . Vesicare [Solifenacin Succinate] Other (See Comments)    Froze the intestine   . Ciprofloxacin Other (See Comments) and Rash    Felt flushed Unknown   . Oxybutynin Rash    unknown  . Solifenacin Rash    unknown    Family History  Problem Relation Age of Onset  . High blood pressure Mother   . Alzheimer's disease Father    Prior to Admission medications   Medication Sig Start Date End Date Taking? Authorizing Provider  acetaminophen (TYLENOL) 325 MG tablet Take 650 mg by mouth every 6 (six) hours as needed for mild pain or headache.   Yes [provider]  albuterol (PROVENTIL HFA;VENTOLIN HFA) 108 (90 BASE) MCG/ACT inhaler Inhale 2 puffs into the lungs every 6 (six) hours as needed for wheezing or shortness of breath.    Yes [provider]  benazepril (LOTENSIN) 10 MG tablet Take 10 mg by mouth daily. 04/24/18  Yes [provider]  budesonide-formoterol (SYMBICORT) 80-4.5 MCG/ACT inhaler Inhale 2 puffs into the lungs 2 (two) times daily. 02/09/16  Yes Tanda Rockers, MD  buPROPion [facility] SR) 150 MG 12 hr tablet Take 150 mg by mouth 2 (two) times daily.    Yes [provider]  hydrALAZINE (APRESOLINE) 50 MG tablet Take 50 mg by mouth 3 (three) times daily. 04/11/16  Yes [provider]  metoprolol tartrate (LOPRESSOR) 100 MG tablet Take 100 mg by mouth 2 (two) times daily. 04/25/18  Yes [provider]  montelukast (SINGULAIR) 10 MG tablet Take 10 mg by mouth at bedtime. 01/17/16  Yes [provider]  paliperidone (INVEGA) 6 MG 24 hr tablet Take 6 mg by mouth at bedtime.   Yes [provider]  pantoprazole (PROTONIX) 40 MG tablet Take 1 tablet (40 mg total)  by mouth 2 (two) times daily. 09/19/16  Yes Aline August, MD  sucralfate (CARAFATE) 1 g tablet Take 1 g by mouth 4 (four) times daily.  07/20/17  Yes [provider]    Physical Exam: Vitals:   06/24/18 0630 06/24/18 0700 06/24/18 0730 06/24/18 0836  BP: (!) 164/104 (!) 169/83 (!) 149/90 (!) 169/91  Pulse: 60 (!) 58 (!) 58 60  Resp: 16 17 15 15   Temp:    97.8 F (36.6 C)  TempSrc:    Oral  SpO2: 97% 97% 98% 98%  Weight:      Height:        Constitutional: NAD, calm, comfortable Vitals:   06/24/18 0630 06/24/18 0700 06/24/18 0730 06/24/18 0836  BP: (!) 164/104 (!) 169/83 (!) 149/90 (!) 169/91  Pulse: 60 (!) 58 (!) 58 60  Resp: 16 17 15 15   Temp:  97.8 F (36.6 C)  TempSrc:    Oral  SpO2: 97% 97% 98% 98%  Weight:      Height:       Eyes: PERRL, lids and conjunctivae normal ENMT: Mucous membranes are moist. Posterior pharynx clear of any exudate or lesions.Normal dentition.  Neck: normal, supple, no masses, no thyromegaly Respiratory: clear to auscultation bilaterally, no wheezing, no crackles. Normal respiratory effort. No accessory muscle use.  Cardiovascular: Regular rate and rhythm, no murmurs / rubs / gallops. Trace edema. Abdomen: protuberant abdomen, mild diffuse TTP. Musculoskeletal: no clubbing / cyanosis. No joint deformity upper and lower extremities. Good ROM, no contractures. Normal muscle tone.  Skin: no rashes, lesions, ulcers. No induration Neurologic: CN 2-12 grossly intact. Sensation intact. Psychiatric: Normal judgment and insight. Alert and oriented x 3. Normal mood.   Labs on Admission: I have personally reviewed following labs and imaging studies  CBC: Recent Labs  Lab 06/24/18 0438  WBC 7.9  HGB 13.3  HCT 43.1  MCV 89.6  PLT 161   Basic Metabolic Panel: Recent Labs  Lab 06/24/18 0438  NA 137  K 4.7  CL 106  CO2 24  GLUCOSE 111*  BUN 18  CREATININE 1.82*  CALCIUM 9.0   GFR: Estimated Creatinine Clearance: 56.8 mL/min  (Lyam Provencio) (by C-G formula based on SCr of 1.82 mg/dL (H)). Liver Function Tests: Recent Labs  Lab 06/24/18 0438  AST 19  ALT 18  ALKPHOS 72  BILITOT 0.3  PROT 7.4  ALBUMIN 4.2   Recent Labs  Lab 06/24/18 0438  LIPASE 32   No results for input(s): AMMONIA in the last 168 hours. Coagulation Profile: No results for input(s): INR, PROTIME in the last 168 hours. Cardiac Enzymes: Recent Labs  Lab 06/24/18 0825  TROPONINI <0.03   BNP (last 3 results) No results for input(s): PROBNP in the last 8760 hours. HbA1C: No results for input(s): HGBA1C in the last 72 hours. CBG: No results for input(s): GLUCAP in the last 168 hours. Lipid Profile: No results for input(s): CHOL, HDL, LDLCALC, TRIG, CHOLHDL, LDLDIRECT in the last 72 hours. Thyroid Function Tests: No results for input(s): TSH, T4TOTAL, FREET4, T3FREE, THYROIDAB in the last 72 hours. Anemia Panel: No results for input(s): VITAMINB12, FOLATE, FERRITIN, TIBC, IRON, RETICCTPCT in the last 72 hours. Urine analysis:    Component Value Date/Time   COLORURINE YELLOW 06/24/2018 [place] 06/24/2018 0439   LABSPEC 1.018 06/24/2018 0439   PHURINE 6.0 06/24/2018 0439   GLUCOSEU NEGATIVE 06/24/2018 0439   HGBUR NEGATIVE 06/24/2018 0439   BILIRUBINUR NEGATIVE 06/24/2018 0439   KETONESUR NEGATIVE 06/24/2018 0439   PROTEINUR 100 (Kitzia Camus) 06/24/2018 0439   UROBILINOGEN 0.2 04/17/2011 1030   NITRITE NEGATIVE 06/24/2018 0439   LEUKOCYTESUR NEGATIVE 06/24/2018 0439    Radiological Exams on Admission: Dg Chest 2 View  Result Date: 06/24/2018 CLINICAL DATA:  Shortness of breath EXAM: CHEST - 2 VIEW COMPARISON:  10/15/2017 FINDINGS: Dual-chamber pacer leads from the left in unremarkable position. Low volume chest. There is no edema, consolidation, effusion, or pneumothorax. Normal heart size and mediastinal contours accounting for mediastinal fat. Artifact from EKG leads. IMPRESSION: No acute finding. Electronically Signed   By:  Monte Fantasia M.D.   On: 06/24/2018 06:01    EKG: Independently reviewed. Atrial paced, LBBB appears similar to priors  Assessment/Plan Active Problems:   Chest pain  Atypical Chest Pain: he describes exertional right sided chest tightness which started about 1 week ago after his MRI  when pacemaker was switched to MRI mode for his abdominal MRI.  Per EDP note, no events since 06/12/18 (I confirmed this with [employer] rep as well).  Describes diaphoresis as well as lightheadedness and exertional dyspnea since that time.  Admit for CP r/o.   Have consulted cardiology, appreciate recs S/p ASA A1c, lipids Trend troponins  Exertional Dyspnea: as noted above, all sx started after MRI and when pacemaker was switched to MRI mode.  Describes this as occurring in AM or when sleeping.  Follow BNP.  CXR without acute findings.  He has some wheezing on exam and hx of asthma.   Continue to monitor, follow BNP Pt does have some wheezing on exam, will schedule nebs.  Not c/w exacerbation, will hold off on steroids.  Lightheadedness:  Follow troponins.  Follow on telemetry.  Sx since MRI as noted above.      Appreciate cards assistance  General Malaise: pan positive review of systems (tension type HA, night sweats, subjective fevers, chills, etc), suspect related to symptoms above.  Will continue to monitor.  Labs and w/u so far unremarkable.  History of syncope  S/p Pacemaker Placement  Hx LBBB and Romario Tith flutter: s/p pacemaker placement by Dr. Lovena Le on 02/2017 for hx of syncope, concern for stokes adams attacks. Cardiology c/s as noted above  Chronic Kidney Disease stage III: baseline appears to be ~1.7.  Close to baseline at ~1.8 today.  Has proteinuria on UA.  Follow UP/C.  Abdominal Discomfort  Hx Interstitial Cystitis: mild generalized abdominal discomfort on exam.  Labs unremarkable.  He does have hx of IC that he thinks may be contributing, continue to monitor.   Asthma: has some mild wheezing  on exam.  continue albuterol and symbicort.  Continue singulair.   Hx Schizophrenia  Depression: wellbutrin, invega  Hypertension: Initially significantly hypertensive in ED, now better, but still hypertensive.  continue metoprolol, hydralazine, benazepril  GERD: continue PPI and carafate  DVT prophylaxis: lovenox  Code Status: full  Family Communication: wife at bedside  Disposition Plan: pending   Consults called: cardiology Admission status: observation    Fayrene Helper MD [facility] Pager AMION  If 7PM-7AM, please contact night-coverage [email or web address] Password [email or web address]  06/24/2018, 9:08 AM

## 2018-06-24 NOTE — Plan of Care (Signed)

## 2018-06-24 NOTE — ED Triage Notes (Signed)
Patient complaining of nausea and abdominal pain. The patient states this started after they cut his defibrillator. Patient is having night sweats and sometimes trouble walking.

## 2018-06-24 NOTE — ED Notes (Signed)
ED TO INPATIENT HANDOFF REPORT  Name/Age/Gender Peter Marsh 58 y.o. male  Code Status Code Status History    Date Active Date Inactive Code Status Order ID Comments User Context   02/26/2017 1751 02/27/2017 1338 Full Code [id number]  Evans Lance, MD Inpatient   09/16/2016 0605 09/19/2016 1426 Full Code [id number]  Vianne Bulls, MD ED   06/18/2013 1251 06/18/2013 1816 Full Code [id number]  Shanda Howells, MD ED   04/19/2011 1518 04/21/2011 2207 Full Code [id number]  Macabuag, Lanny Hurst, RN Inpatient      Home/SNF/Other Home  Chief Complaint Chest Pain   Level of Care/Admitting Diagnosis ED Disposition    ED Disposition Condition [name]  Level of Care: Telemetry [5]  Admit to tele based on following criteria: Monitor for Ischemic changes  Diagnosis: Chest pain [710626]  Admitting Physician: Rise Patience [phone]  Attending Physician: Rise Patience [phone]  PT Class (Do Not Modify): Observation [104]  PT Acc Code (Do Not Modify): Observation [10022]       Medical History Past Medical History:  Diagnosis Date  . Asthma    uses inhalers   . Bilateral carotid bruits   . Chronic kidney disease    bladder interstial cystitis   . Chronic kidney disease (CKD), stage IV (severe) ([name])    [facility] at [facility]  . Depression   . GERD (gastroesophageal reflux disease)   . History of stomach ulcers 2001  . Hypertension   . LBBB (left bundle branch block)   . Lower extremity edema   . Mild intermittent asthma without complication   . Mixed hyperlipidemia   . Paroxysmal atrial flutter ([place])   . PONV (postoperative nausea and vomiting)   . Schizophrenia ([place])     Allergies Allergies  Allergen Reactions  . Methylpyrrolidone Hives    froze the intestine  . Niacin Other (See Comments) and Nausea And Vomiting    Flushing, itching, tingling   . Norvasc [Amlodipine  Besylate]     Swollen Feet  . Oxybutynin Chloride [Oxybutynin Chloride Er] Other (See Comments)    froze the intestine  . Vesicare [Solifenacin Succinate] Other (See Comments)    Froze the intestine   . Ciprofloxacin Other (See Comments) and Rash    Felt flushed Unknown   . Oxybutynin Rash    unknown  . Solifenacin Rash    unknown    IV Location/Drains/Wounds Patient Lines/Drains/Airways Status   Active Line/Drains/Airways    Name:   Placement date:   Placement time:   Site:   Days:   Peripheral IV 06/24/18 Right Antecubital   06/24/18    0631    Antecubital   less than 1   Incision 04/19/11 Back Other (Comment)   04/19/11    1049     2623   Incision (Closed) 02/27/17 Chest Left   02/27/17    0815     482          Labs/Imaging Results for orders placed or performed during the hospital encounter of 06/24/18 (from the past 48 hour(s))  Lipase, blood     Status: None   Collection Time: 06/24/18  4:38 AM  Result Value Ref Range   Lipase 32 11 - 51 U/L    Comment: Performed at [facility], [place] [address], [place], [place] [address]  Comprehensive metabolic panel     Status: Abnormal  Collection Time: 06/24/18  4:38 AM  Result Value Ref Range   Sodium 137 135 - 145 mmol/L   Potassium 4.7 3.5 - 5.1 mmol/L   Chloride 106 98 - 111 mmol/L   CO2 24 22 - 32 mmol/L   Glucose, Bld 111 (H) 70 - 99 mg/dL   BUN 18 6 - 20 mg/dL   Creatinine, Ser 1.82 (H) 0.61 - 1.24 mg/dL   Calcium 9.0 8.9 - 10.3 mg/dL   Total Protein 7.4 6.5 - 8.1 g/dL   Albumin 4.2 3.5 - 5.0 g/dL   AST 19 15 - 41 U/L   ALT 18 0 - 44 U/L   Alkaline Phosphatase 72 38 - 126 U/L   Total Bilirubin 0.3 0.3 - 1.2 mg/dL   GFR calc non Af Amer 40 (L) >60 mL/min   GFR calc Af Amer 46 (L) >60 mL/min   Anion gap 7 5 - 15    Comment: Performed at [facility], [place] [address], [place], [place] [address]  CBC     Status: None   Collection Time: 06/24/18  4:38 AM  Result Value Ref  Range   WBC 7.9 4.0 - 10.5 K/uL   RBC 4.81 4.22 - 5.81 MIL/uL   Hemoglobin 13.3 13.0 - 17.0 g/dL   HCT 43.1 39.0 - 52.0 %   MCV 89.6 80.0 - 100.0 fL   MCH 27.7 26.0 - 34.0 pg   MCHC 30.9 30.0 - 36.0 g/dL   RDW 12.8 11.5 - 15.5 %   Platelets 242 150 - 400 K/uL   nRBC 0.0 0.0 - 0.2 %    Comment: Performed at [facility], [place] [address], [place], [place] [address]  Urinalysis, Routine w reflex microscopic     Status: Abnormal   Collection Time: 06/24/18  4:39 AM  Result Value Ref Range   Color, Urine YELLOW YELLOW   APPearance CLEAR CLEAR   Specific Gravity, Urine 1.018 1.005 - 1.030   pH 6.0 5.0 - 8.0   Glucose, UA NEGATIVE NEGATIVE mg/dL   Hgb urine dipstick NEGATIVE NEGATIVE   Bilirubin Urine NEGATIVE NEGATIVE   Ketones, ur NEGATIVE NEGATIVE mg/dL   Protein, ur 100 (A) NEGATIVE mg/dL   Nitrite NEGATIVE NEGATIVE   Leukocytes,Ua NEGATIVE NEGATIVE   RBC / HPF 0-5 0 - 5 RBC/hpf   WBC, UA 6-10 0 - 5 WBC/hpf   Bacteria, UA RARE (A) NONE SEEN   Squamous Epithelial / LPF 0-5 0 - 5   Mucus PRESENT     Comment: Performed at [facility], [place] [address], [place], [place] [address]  I-Stat Troponin, ED (not at [facility])     Status: None   Collection Time: 06/24/18  5:03 AM  Result Value Ref Range   Troponin i, poc 0.00 0.00 - 0.08 ng/mL   Comment 3            Comment: Due to the release kinetics of cTnI, a negative result within the first hours of the onset of symptoms does not rule out myocardial infarction with certainty. If myocardial infarction is still suspected, repeat the test at appropriate intervals.    Dg Chest 2 View  Result Date: 06/24/2018 CLINICAL DATA:  Shortness of breath EXAM: CHEST - 2 VIEW COMPARISON:  10/15/2017 FINDINGS: Dual-chamber pacer leads from the left in unremarkable position. Low volume chest. There is no edema, consolidation, effusion, or pneumothorax. Normal heart size and mediastinal contours accounting for  mediastinal fat. Artifact  from EKG leads. IMPRESSION: No acute finding. Electronically Signed   By: Monte Fantasia M.D.   On: 06/24/2018 06:01    Pending Labs Unresulted Labs (From admission, onward)    Start     Ordered   06/24/18 0723  Troponin I - Now Then Q6H  Now then every 6 hours,   R     06/24/18 0722          Vitals/Pain Today's Vitals   06/24/18 0600 06/24/18 0630 06/24/18 0700 06/24/18 0730  BP: (!) 164/97 (!) 164/104 (!) 169/83 (!) 149/90  Pulse: (!) 59 60 (!) 58 (!) 58  Resp: 19 16 17 15   Temp:      TempSrc:      SpO2: 97% 97% 97% 98%  Weight:      Height:      PainSc:        Isolation Precautions No active isolations  Medications Medications  sodium chloride flush (NS) 0.9 % injection 3 mL (0 mLs Intravenous Hold 06/24/18 0754)  ondansetron (ZOFRAN-ODT) disintegrating tablet 4 mg (has no administration in time range)  aspirin chewable tablet 324 mg (324 mg Oral Given 06/24/18 0510)    Mobility walks

## 2018-06-24 NOTE — Consult Note (Signed)
CARDIOLOGY CONSULT NOTE  Patient ID: Peter Marsh MRN: [id number] DOB/AGE: 09/15/1960 58 y.o.  Admit date: 06/24/2018 Referring Physician  Fayrene Helper, MD Primary Physician:  Marda Stalker, PA-C Reason for Consultation  Chest pain  HPI: Peter Marsh  is a 58 y.o. male  With   h/o schizophrenia, hypertension, LBBB, chronic stage 3-4 CKD, bronchial asthma, h/o GI bleed in may of 2018 while in the hospital had atrial flutter with 2:1 conduction with RVR, again during a negative nuclear stress test in Nov 2017 and has had documented atrial fibrillation/flutter with 2:1 conduction.  Echocardiogram revealed normal LVEF with mild diastolic dysfunction and negative ischemia by stress. He has had recurrent episodes of syncope and underwent EP evaluation and there was no inducible arrhythmias however due to high grade conduction system disease and a permanent Medtronic pacemaker was implanted using His bundle on 02/26/2017 by Dr. Crissie Sickles and anticoagulation was discontinued.  Patient admitted to the hospital with abdominal discomfort, fatigue and marked diaphoresis when he woke up around 1:00 in the afternoon and presented to the emergency room with feverish feeling and overall feeling generalized weakness.  States that symptoms started after recent MRI on 06/12/2018 performed for evaluation of renal mass.  The mass was felt to be benign hemorrhagic cyst on the right and left upper pole 11 mm mass was felt to be indeterminate and recommended six-month follow-up.  He also admits to having an episode of exertional chest pain 2 days ago, his daughter has given birth to a boy and he had gone to see the baby, he had to walk a long distance and stated that he felt chest tightness and had to go sit down.  States that he has not had any recurrence of chest pain since then.  Past Medical History:  Diagnosis Date  . Asthma    uses inhalers   . Bilateral carotid bruits   . Cardiac conduction  disorder 2018   s/p MDT PPM  . Chronic kidney disease    bladder interstial cystitis   . Chronic kidney disease (CKD), stage IV (severe) ([name])    follow[facility] by Dr. Joelyn [facility]  . Depression   . GERD (gastroesophageal reflux disease)   . History of stomach ulcers 2001  . Hypertension   . LBBB (left bundle branch block)   . Lower extremity edema   . Mild intermittent asthma without complication   . Mixed hyperlipidemia   . Paroxysmal atrial flutter ([place])   . PONV (postoperative nausea and vomiting)   . Schizophrenia [facility])      Past Surgical History:  Procedure Laterality Date  . APPENDECTOMY    . ELECTROPHYSIOLOGY STUDY N/A 02/26/2017   Procedure: ELECTROPHYSIOLOGY STUDY;  Surgeon: Evans Lance, MD;  Location: [place] [facility];  Service: Cardiovascular;  Laterality: N/A;  . ESOPHAGOGASTRODUODENOSCOPY (EGD) WITH PROPOFOL N/A 09/18/2016   Procedure: ESOPHAGOGASTRODUODENOSCOPY (EGD) WITH PROPOFOL;  Surgeon: Ronnette Juniper, MD;  Location: [place];  Service: Gastroenterology;  Laterality: N/A;  . LUMBAR LAMINECTOMY/DECOMPRESSION MICRODISCECTOMY  04/19/2011   Procedure: LUMBAR LAMINECTOMY/DECOMPRESSION MICRODISCECTOMY;  Surgeon: Tobi Bastos;  Location: [facility];  Service: Orthopedics;  Laterality: Left;  Hemi LAminectomy/Microdiscectomy Lumbar four  - Lumbar five  on the Left (X-Ray)  . PACEMAKER IMPLANT N/A 02/26/2017   Procedure: PACEMAKER IMPLANT;  Surgeon: Evans Lance, MD;  Location: [place] [facility];  Service: Cardiovascular;  Laterality: N/A;  . TONSILLECTOMY       Family History  Problem Relation  Age of Onset  . High blood pressure Mother   . Alzheimer's disease Father      Social History: Social History   Socioeconomic History  . Marital status: Married    Spouse name: Not on file  . Number of children: Not on file  . Years of education: Not on file  . Highest education level: Not on file  Occupational History  . Not on file  Social Needs   . Financial resource strain: Not on file  . Food insecurity:    Worry: Not on file    Inability: Not on file  . Transportation needs:    Medical: Not on file    Non-medical: Not on file  Tobacco Use  . Smoking status: Former Smoker    Packs/day: 1.00    Years: 30.00    Pack years: 30.00    Last attempt to quit: 05/08/2008    Years since quitting: 10.1  . Smokeless tobacco: Never Used  Substance and Sexual Activity  . Alcohol use: No  . Drug use: No  . Sexual activity: Never  Lifestyle  . Physical activity:    Days per week: Not on file    Minutes per session: Not on file  . Stress: Not on file  Relationships  . Social connections:    Talks on phone: Not on file    Gets together: Not on file    Attends religious service: Not on file    Active member of club or organization: Not on file    Attends meetings of clubs or organizations: Not on file    Relationship status: Not on file  . Intimate partner violence:    Fear of current or ex partner: Not on file    Emotionally abused: Not on file    Physically abused: Not on file    Forced sexual activity: Not on file  Other Topics Concern  . Not on file  Social History Narrative  . Not on file     Medications Prior to Admission  Medication Sig Dispense Refill Last Dose  . acetaminophen (TYLENOL) 325 MG tablet Take 650 mg by mouth every 6 (six) hours as needed for mild pain or headache.   06/23/2018 at Unknown time  . albuterol (PROVENTIL HFA;VENTOLIN HFA) 108 (90 BASE) MCG/ACT inhaler Inhale 2 puffs into the lungs every 6 (six) hours as needed for wheezing or shortness of breath.    06/23/2018 at Unknown time  . benazepril (LOTENSIN) 10 MG tablet Take 10 mg by mouth daily.   06/23/2018 at Unknown time  . budesonide-formoterol (SYMBICORT) 80-4.5 MCG/ACT inhaler Inhale 2 puffs into the lungs 2 (two) times daily. 1 Inhaler 11 06/23/2018 at Unknown time  . buPROPion (WELLBUTRIN SR) 150 MG 12 hr tablet Take 150 mg by mouth 2 (two)  times daily.    06/23/2018 at Unknown time  . hydrALAZINE (APRESOLINE) 50 MG tablet Take 50 mg by mouth 3 (three) times daily.   06/23/2018 at Unknown time  . metoprolol tartrate (LOPRESSOR) 100 MG tablet Take 100 mg by mouth 2 (two) times daily.   06/23/2018 at 8-9pm  . montelukast (SINGULAIR) 10 MG tablet Take 10 mg by mouth at bedtime.   06/23/2018 at Unknown time  . paliperidone (INVEGA) 6 MG 24 hr tablet Take 6 mg by mouth at bedtime.   06/23/2018 at Unknown time  . pantoprazole (PROTONIX) 40 MG tablet Take 1 tablet (40 mg total) by mouth 2 (two) times daily. 60 tablet 0 06/23/2018  at Unknown time  . sucralfate (CARAFATE) 1 g tablet Take 1 g by mouth 4 (four) times daily.   0 06/23/2018 at Unknown time    Review of Systems  Constitutional: Negative for malaise/fatigue and weight loss.  Respiratory: Positive for shortness of breath. Negative for cough and hemoptysis.   Cardiovascular: Positive for chest pain. Negative for palpitations, claudication and leg swelling.  Gastrointestinal: Positive for abdominal pain. Negative for blood in stool, constipation, heartburn and vomiting.  Genitourinary: Negative for dysuria. Flank pain: chronic.  Musculoskeletal: Negative for joint pain and myalgias.  Neurological: Positive for dizziness. Negative for focal weakness and headaches.  Endo/Heme/Allergies: Does not bruise/bleed easily.  Psychiatric/Behavioral: Negative for depression. The patient is not nervous/anxious.   All other systems reviewed and are negative.   Physical Exam: Blood pressure (!) 145/78, pulse 75, temperature 98 F (36.7 C), temperature source Oral, resp. rate 16, height 6\' 1"  (1.854 m), weight 115.3 kg, SpO2 98 %.  Physical Exam  Constitutional: He appears well-developed and well-nourished. No distress.  HENT:  Head: Atraumatic.  Eyes: Conjunctivae are normal.  Neck: Neck supple. No JVD present. No thyromegaly present.  Cardiovascular: Normal rate, regular rhythm, normal heart  sounds and intact distal pulses. Exam reveals no gallop.  No murmur heard. Pulses:      Carotid pulses are 2+ on the right side with bruit and 2+ on the left side with bruit.      Radial pulses are 2+ on the right side and 2+ on the left side.       Femoral pulses are 1+ on the right side and 1+ on the left side.      Popliteal pulses are 2+ on the right side and 2+ on the left side.       Dorsalis pedis pulses are 0 on the right side and 0 on the left side.       Posterior tibial pulses are 2+ on the right side and 2+ on the left side.  Femoral and popliteal pulse difficult to feel due to patient's bodily habitus.  Pulmonary/Chest: Effort normal and breath sounds normal.  Pacemaker pocket in the left infraclavicular region noted.  Abdominal: Soft. Bowel sounds are normal.  Musculoskeletal: Normal range of motion.        General: No edema.  Neurological: He is alert.  Skin: Skin is warm and dry.  Psychiatric: He has a normal mood and affect.    Labs:  BNP (last 3 results) Recent Labs    10/15/17 1550 06/24/18 0826  BNP 81.2 89.4    CMP Latest Ref Rng & Units 06/24/2018 06/24/2018 10/15/2017  Glucose 70 - 99 mg/dL - 111(H) 104(H)  BUN 6 - 20 mg/dL - 18 11  Creatinine 0.61 - 1.24 mg/dL 1.80(H) 1.82(H) 1.76(H)  Sodium 135 - 145 mmol/L - 137 138  Potassium 3.5 - 5.1 mmol/L - 4.7 3.7  Chloride 98 - 111 mmol/L - 106 104  CO2 22 - 32 mmol/L - 24 24  Calcium 8.9 - 10.3 mg/dL - 9.0 9.0  Total Protein 6.5 - 8.1 g/dL - 7.4 -  Total Bilirubin 0.3 - 1.2 mg/dL - 0.3 -  Alkaline Phos 38 - 126 U/L - 72 -  AST 15 - 41 U/L - 19 -  ALT 0 - 44 U/L - 18 -     CBC Latest Ref Rng & Units 06/24/2018 06/24/2018 10/15/2017  WBC 4.0 - 10.5 K/uL 6.9 7.9 8.9  Hemoglobin 13.0 - 17.0 g/dL 12.5(L)  13.3 12.9(L)  Hematocrit 39.0 - 52.0 % 40.5 43.1 39.3  Platelets 150 - 400 K/uL 218 242 300      Lipid Panel     Component Value Date/Time   CHOL 168 06/24/2018 0826   TRIG 157 (H) 06/24/2018 0826    HDL 23 (L) 06/24/2018 0826   CHOLHDL 7.3 06/24/2018 0826   VLDL 31 06/24/2018 0826   LDLCALC 114 (H) 06/24/2018 0826     BNP (last 3 results) Recent Labs    10/15/17 1550 06/24/18 0826  BNP 81.2 89.4     HEMOGLOBIN A1C No results found for: HGBA1C, MPG  BMP Latest Ref Rng & Units 06/24/2018 06/24/2018 10/15/2017  Glucose 70 - 99 mg/dL - 111(H) 104(H)  BUN 6 - 20 mg/dL - 18 11  Creatinine 0.61 - 1.24 mg/dL 1.80(H) 1.82(H) 1.76(H)  BUN/Creat Ratio 9 - 20 - - -  Sodium 135 - 145 mmol/L - 137 138  Potassium 3.5 - 5.1 mmol/L - 4.7 3.7  Chloride 98 - 111 mmol/L - 106 104  CO2 22 - 32 mmol/L - 24 24  Calcium 8.9 - 10.3 mg/dL - 9.0 9.0     TSH Recent Labs    06/24/18 0924  TSH 1.673     Radiology: Dg Chest 2 View  Result Date: 06/24/2018 CLINICAL DATA:  Shortness of breath EXAM: CHEST - 2 VIEW COMPARISON:  10/15/2017 FINDINGS: Dual-chamber pacer leads from the left in unremarkable position. Low volume chest. There is no edema, consolidation, effusion, or pneumothorax. Normal heart size and mediastinal contours accounting for mediastinal fat. Artifact from EKG leads. IMPRESSION: No acute finding. Electronically Signed   By: Monte Fantasia M.D.   On: 06/24/2018 06:01    Scheduled Meds: . [START ON 06/25/2018] albuterol  2.5 mg Nebulization Daily  . benazepril  10 mg Oral Daily  . buPROPion  150 mg Oral BID  . enoxaparin (LOVENOX) injection  40 mg Subcutaneous Q24H  . fluticasone furoate-vilanterol  1 puff Inhalation Daily  . hydrALAZINE  50 mg Oral TID  . [START ON 06/25/2018] Influenza vac split quadrivalent PF  0.5 mL Intramuscular Tomorrow-1000  . metoprolol tartrate  100 mg Oral BID  . montelukast  10 mg Oral QHS  . paliperidone  6 mg Oral QHS  . pantoprazole  40 mg Oral BID  . polyethylene glycol  17 g Oral Daily  . sodium chloride flush  3 mL Intravenous Once  . sucralfate  1 g Oral QID   Continuous Infusions: PRN Meds:.acetaminophen **OR** acetaminophen,  albuterol, ondansetron **OR** ondansetron (ZOFRAN) IV, ondansetron, oxyCODONE  CARDIAC STUDIES:  EKG 06/24/2018: Normal sinus rhythm at rate of 65 bpm, left bundle branch block.  No further analysis.  Echocardiogram [03/28/2016]:  1. Left ventricle cavity is normal in size. Mild concentric hypertrophy of the left ventricle. Borderline normal LV syst. function. Visual EF is approx.50%. Abnormal septal wall motion due to left bundle branch block. Doppler evidence of grade I (impaired) diastolic dysfunction. 2. Mild (Grade I) mitral regurgitation. 3. Mild tricuspid regurgitation. No evidence of pulmonary hypertension.  Nuclear stress test [03/13/2016]: Lexiscan sestamibi stress test 03/13/2016: 1. The resting electrocardiogram demonstrated sinus tachycardia/Atrial tachycaardia 152/min with underlying LBBB. Stress EKG is non-diagnostic for ischemia as it a pharmacologic stress using Lexiscan. Patient developed wide-complex tachycardia, what appears to be probably A. Flutter with 2:1 conduction at the rate of 156 bpm with Lexiscan infusion without additional ST-T wave changes of ischemia. 2. The perfusion imaging studies demonstrate anterior and anteroseptal  wall thickening without demonstrable ischemia or scar. LVEF calculated by QGS was 52%. This is a low risk study.   ASSESSMENT AND PLAN:  1.  Exertional chest pain suggestive of chronic stable angina pectoris.  Cardiac markers are negative for myocardial injury. Lexiscan stress test in 2017 was negative for myocardial ischemia and low risk.  EKG uni-nterpretable due to presence of LBBB. 2.  High degree AV block S/P Medtronic permanent pacemaker implantation, dual-chamber. Interrogation of the pacemaker remotely reveals a paced 35%, V paced 25%, normal thresholds.  No atrial fibrillation. 4. Permanent dual chamber pacemaker in situ: MRI safe cardiac pacemaker in situ (Z95.0) 02/26/2017 Underluying LBBB and severe conduction system disease  with EP study S/ P Medtronic [id number] Azure XT DR MRI Dual Chamber (Pacemaker) QA#[id number] using His Bundle Lead by Cristopher Peru, MD  Unscheduled remote pacemaker transmission 06/24/2018: Normal thresholds, a paced 35%, V paced 25%, no mode switches.  No atrial fibrillation.  Normal pacemaker function. Scheduled Remote Pacemaker transmission 10/22/2017 Normal pacemaker function. AP<45% and VP 22%. No high rate episodes. Battery life > 5 years. 3.  Moderate obesity.  4. Hypertension with hypertensive heart disease and stage III renal disease, stable renal function. BP uncontrolled  Recommendation: Patient symptoms of exertional chest pain appears to be more consistent with chronic stable angina pectoris.  This can be worked up in the outpatient basis. Blood pressure is uncontrolled, I will add Catapres 0.2 mg twice daily. Pacemaker function is normal.  We will continue remote monitoring.  Pacemaker is also MRI safe.  Do not suspect any of his symptoms of abdominal discomfort, feverish feeling, generalized weakness is related to his pacemaker interrogation and/or function change. I have again discussed with him regarding making lifestyle changes, avoidance of excessive calories and salt intake was discussed.  I will set up an office visit with me to be seen in the elective fashion, I will also set up a outpatient [place] stress test.  Thank you for the consult.  Adrian Prows, MD, [facility] 06/24/2018, 7:52 PM [facility] [place] Pager: [phone] Office: [phone] If no answer Cell [phone]

## 2018-06-24 NOTE — ED Notes (Signed)
Medtronic pacemaker interrogated. Paperwork received and medtronic on phone with EDP.

## 2018-06-25 DIAGNOSIS — J45991 Cough variant asthma: Secondary | ICD-10-CM

## 2018-06-25 DIAGNOSIS — N183 Chronic kidney disease, stage 3 (moderate): Secondary | ICD-10-CM

## 2018-06-25 DIAGNOSIS — I208 Other forms of angina pectoris: Secondary | ICD-10-CM

## 2018-06-25 DIAGNOSIS — R079 Chest pain, unspecified: Secondary | ICD-10-CM | POA: Diagnosis not present

## 2018-06-25 LAB — CBC
HCT: 37.4 % — ABNORMAL LOW (ref 39.0–52.0)
Hemoglobin: 11.7 g/dL — ABNORMAL LOW (ref 13.0–17.0)
MCH: 28.4 pg (ref 26.0–34.0)
MCHC: 31.3 g/dL (ref 30.0–36.0)
MCV: 90.8 fL (ref 80.0–100.0)
Platelets: 208 10*3/uL (ref 150–400)
RBC: 4.12 MIL/uL — ABNORMAL LOW (ref 4.22–5.81)
RDW: 12.9 % (ref 11.5–15.5)
WBC: 8.1 10*3/uL (ref 4.0–10.5)
nRBC: 0 % (ref 0.0–0.2)

## 2018-06-25 LAB — COMPREHENSIVE METABOLIC PANEL
ALT: 15 U/L (ref 0–44)
AST: 15 U/L (ref 15–41)
Albumin: 3.4 g/dL — ABNORMAL LOW (ref 3.5–5.0)
Alkaline Phosphatase: 59 U/L (ref 38–126)
Anion gap: 7 (ref 5–15)
BUN: 20 mg/dL (ref 6–20)
CO2: 21 mmol/L — ABNORMAL LOW (ref 22–32)
Calcium: 8.8 mg/dL — ABNORMAL LOW (ref 8.9–10.3)
Chloride: 108 mmol/L (ref 98–111)
Creatinine, Ser: 1.91 mg/dL — ABNORMAL HIGH (ref 0.61–1.24)
GFR calc non Af Amer: 38 mL/min — ABNORMAL LOW (ref >60)
GFR, EST AFRICAN AMERICAN: 43 mL/min — AB (ref >60)
Glucose, Bld: 108 mg/dL — ABNORMAL HIGH (ref 70–99)
Potassium: 4.9 mmol/L (ref 3.5–5.1)
Sodium: 136 mmol/L (ref 135–145)
Total Bilirubin: 0.5 mg/dL (ref 0.3–1.2)
Total Protein: 6.2 g/dL — ABNORMAL LOW (ref 6.5–8.1)

## 2018-06-25 LAB — HEMOGLOBIN A1C
Hgb A1c MFr Bld: 5.6 % (ref 4.8–5.6)
Mean Plasma Glucose: 114 mg/dL

## 2018-06-25 LAB — HIV ANTIBODY (ROUTINE TESTING W REFLEX): HIV Screen 4th Generation wRfx: NONREACTIVE

## 2018-06-25 MED ORDER — CLONIDINE HCL 0.1 MG PO TABS: 0.1000 mg | ORAL_TABLET | Freq: Two times a day (BID) | ORAL | 1 refills | Status: DC

## 2018-06-25 NOTE — Discharge Summary (Signed)
Physician Discharge Summary  Peter Marsh FFM:[id number] DOB: 05/18/1960 DOA: 06/24/2018  PCP: Marda Stalker, PA-C  Admit date: 06/24/2018 Discharge date: 06/25/2018  Admitted From: Home Disposition: Home  Recommendations for Outpatient Follow-up:  1. Follow up with PCP in 1-2 weeks 2. Please obtain BMP/CBC in one week 3. Please follow up with Dr. Einar Gip  Home Health: None Equipment/Devices: None  Discharge Condition stable  CODE STATUS full code Diet recommendation cardiac  Brief/Interim Summary: 58 y.o. male with medical history significant of asthma, chronic kidney disease, GERD, hypertension, syncope status post pacemaker placement, left bundle branch block, a flutter presenting with about 1 week of lightheadedness, shortness of breath, sweating and exertional chest pain.    Patient notes that his symptoms started about 1 week ago.  He states that he was given an MRI and his pacemaker was "disconnected".  He notes that his symptoms started with abdominal comfort that he describes as being all over.  He describes it as achy, coming and going.  He notes that he has a history of interstitial cystitis.  He typically bloats when he eats.  He subsequently developed lightheadedness, he describes his feeling like he was going to pass out at times.  In addition he noticed sweating.  He describes night sweats for the past few nights.  He notes that he is noticed some exertional chest pain.  The first time was when he was walking across the parking lot.  He describes this as right-sided chest tightness.  The next time that he noticed this was when he was in the parking garage, again describes this as right-sided chest tightness.  He notes subjective fevers, chills, cough.  He notes shortness of breath in the morning and at night as well as with exertion.  He notes some swelling in his legs as well.  He notes that he has had frontal headaches as well.  He denies smoking or drinking.  He denies a  history of heart attack or stroke.  He has a family history of heart attack in his uncle.    Discharge Diagnoses:  Principal Problem:   Chest pain Active Problems:   Cough variant asthma   Dyspnea   CKD (chronic kidney disease), stage III ([name])   Schizophrenia ([name])   Essential hypertension   Pacemaker  #1 chronic stable angina/status post permanent pacemaker dual-chamber secondary to high degree AV block/MRI safe cardiac pacemaker  #2 hypertension uncontrolled continue Lopressor, hydralazine, Lotensin.  Dr. Einar Gip has added Catapres due to uncontrolled blood pressure.  On the day of discharge his blood pressure is stable continue all the 4 medications for now follow-up with Dr. Einar Gip and PCP.  #3 CKD stage III stable  #4 history of asthma continue Symbicort and albuterol  #5 history of schizophrenia and depression continue in [place] and Wellbutrin   Estimated body mass index is 32.9 kg/m as calculated from the following:   Height as of this encounter: 6\' 1"  (1.854 m).   Weight as of this encounter: 113.1 kg.  Discharge Instructions  Discharge Instructions    Call MD for:  persistant dizziness or light-headedness   Complete by:  As directed    Call MD for:  persistant nausea and vomiting   Complete by:  As directed    Call MD for:  severe uncontrolled pain   Complete by:  As directed    Call MD for:  temperature >100.4   Complete by:  As directed    Diet - low sodium heart  healthy   Complete by:  As directed    Increase activity slowly   Complete by:  As directed      Allergies as of 06/25/2018      Reactions   Methylpyrrolidone Hives   froze the intestine   Niacin Other (See Comments), Nausea And Vomiting   Flushing, itching, tingling    Norvasc [amlodipine Besylate]    Swollen Feet   Oxybutynin Chloride [oxybutynin Chloride Er] Other (See Comments)   froze the intestine   Vesicare [solifenacin Succinate] Other (See Comments)   Froze the intestine    Ciprofloxacin Other (See Comments), Rash   Felt flushed Unknown    Oxybutynin Rash   unknown   Solifenacin Rash   unknown      Medication List    TAKE these medications   acetaminophen 325 MG tablet Commonly known as:  TYLENOL Take 650 mg by mouth every 6 (six) hours as needed for mild pain or headache.   albuterol 108 (90 Base) MCG/ACT inhaler Commonly known as:  PROVENTIL HFA;VENTOLIN HFA Inhale 2 puffs into the lungs every 6 (six) hours as needed for wheezing or shortness of breath.   benazepril 10 MG tablet Commonly known as:  LOTENSIN Take 10 mg by mouth daily.   budesonide-formoterol 80-4.5 MCG/ACT inhaler Commonly known as:  SYMBICORT Inhale 2 puffs into the lungs 2 (two) times daily.   buPROPion 150 MG 12 hr tablet Commonly known as:  WELLBUTRIN SR Take 150 mg by mouth 2 (two) times daily.   cloNIDine 0.1 MG tablet Commonly known as:  CATAPRES Take 1 tablet (0.1 mg total) by mouth 2 (two) times daily.   hydrALAZINE 50 MG tablet Commonly known as:  APRESOLINE Take 50 mg by mouth 3 (three) times daily.   metoprolol tartrate 100 MG tablet Commonly known as:  LOPRESSOR Take 100 mg by mouth 2 (two) times daily.   montelukast 10 MG tablet Commonly known as:  SINGULAIR Take 10 mg by mouth at bedtime.   paliperidone 6 MG 24 hr tablet Commonly known as:  INVEGA Take 6 mg by mouth at bedtime.   pantoprazole 40 MG tablet Commonly known as:  PROTONIX Take 1 tablet (40 mg total) by mouth 2 (two) times daily.   sucralfate 1 g tablet Commonly known as:  CARAFATE Take 1 g by mouth 4 (four) times daily.      Follow-up Information    Marda Stalker, PA-C Follow up.   Specialty:  Family Medicine Contact information: [place] [address] [phone]        Evans Lance, MD .   Specialty:  Cardiology Contact information: [phone] [place] [address] [phone]        Adrian Prows, MD .    Specialty:  Cardiology Contact information: [address] [place] [address] [phone]          Allergies  Allergen Reactions  . Methylpyrrolidone Hives    froze the intestine  . Niacin Other (See Comments) and Nausea And Vomiting    Flushing, itching, tingling   . Norvasc [Amlodipine Besylate]     Swollen Feet  . Oxybutynin Chloride [Oxybutynin Chloride Er] Other (See Comments)    froze the intestine  . Vesicare [Solifenacin Succinate] Other (See Comments)    Froze the intestine   . Ciprofloxacin Other (See Comments) and Rash    Felt flushed Unknown   . Oxybutynin Rash    unknown  .  Solifenacin Rash    unknown    Consultations: Cardiology Dr. Einar Gip  Procedures/Studies: Dg Chest 2 View  Result Date: 06/24/2018 CLINICAL DATA:  Shortness of breath EXAM: CHEST - 2 VIEW COMPARISON:  10/15/2017 FINDINGS: Dual-chamber pacer leads from the left in unremarkable position. Low volume chest. There is no edema, consolidation, effusion, or pneumothorax. Normal heart size and mediastinal contours accounting for mediastinal fat. Artifact from EKG leads. IMPRESSION: No acute finding. Electronically Signed   By: Monte Fantasia M.D.   On: 06/24/2018 06:01   Mr Abdomen Wo Contrast  Result Date: 06/12/2018 CLINICAL DATA:  Followup renal lesions. EXAM: MRI ABDOMEN WITHOUT CONTRAST TECHNIQUE: Multiplanar multisequence MR imaging was performed without the administration of intravenous contrast. COMPARISON:  Renal ultrasound 05/13/2018 and prior CT scan 06/17/2016. FINDINGS: Examination is quite limited due to respiratory motion and lack of IV contrast. Lower chest: The lung bases are grossly clear. No pericardial effusion or pleural effusion. Hepatobiliary: No obvious hepatic lesions or intrahepatic biliary dilatation. The gallbladder is contracted. No common bile duct dilatation. Pancreas: No mass, inflammation or ductal dilatation. Moderate atrophy and changes of chronic  calcific pancreatitis noted on the CT scan. Spleen:  Normal size.  No focal lesions. Adrenals/Urinary Tract:  The adrenal glands are normal. Right kidney: Chronic right UPJ obstruction with marked dilatation of the collecting system and extrarenal pelvis. There also small parenchymal renal cysts. Projecting off the lower pole region of the right kidney is a hemorrhagic cyst. This demonstrates low T2 signal intensity and high T1 signal intensity. It measures 2.7 cm. Left kidney: The lesion in question on the left kidney projecting off the upper pole region is a simple cyst measuring a maximum of 3 cm. No worrisome MR imaging features without contrast. Projecting off the upper pole region of the left kidney posteriorly is a small indeterminate lesion measuring approximately 11 mm. It is adjacent to a small simple cyst. This has dark T2 signal intensity but no obvious increased T1 signal intensity to suggest hemorrhage. Stomach/Bowel: Visualized portions within the abdomen are unremarkable. Vascular/Lymphatic: No pathologically enlarged lymph nodes identified. No abdominal aortic aneurysm demonstrated. Other:  No ascites or abdominal wall hernia. Musculoskeletal: No significant bony findings. IMPRESSION: 1. Limited examination due to respiratory motion and lack of IV contrast 2. The lesion in question on the ultrasound involving the upper pole region of the left kidney appears to be a simple cyst by MRI. 3. Lower pole right renal lesion has MR imaging features of a benign hemorrhagic cyst. 4. 11 mm upper pole left renal lesion posteriorly is indeterminate. Recommend continued ultrasound surveillance. Repeat ultrasound examination in 6 months is suggested patient is not a good candidate for follow-up MRI. 5. Chronic right UPJ obstruction. Electronically Signed   By: Marijo Sanes M.D.   On: 06/12/2018 20:59   (Echo, Carotid, EGD, Colonoscopy, ERCP)    Subjective: Patient resting in bed in no acute distress denies  any further chest pain abdominal pain nausea vomiting.  Discharge Exam: Vitals:   06/24/18 2022 06/25/18 0448  BP:  112/67  Pulse:  64  Resp:  15  Temp: 98.1 F (36.7 C) 98.1 F (36.7 C)  SpO2:  97%   Vitals:   06/24/18 2018 06/24/18 2022 06/25/18 0448 06/25/18 0500  BP: (!) 154/77  112/67   Pulse: 68  64   Resp: 16  15   Temp:  98.1 F (36.7 C) 98.1 F (36.7 C)   TempSrc:  Oral Oral   SpO2: 98%  97%   Weight:    113.1 kg  Height:        General: Pt is alert, awake, not in acute distress Cardiovascular: RRR, S1/S2 +, no rubs, no gallops Respiratory: CTA bilaterally, no wheezing, no rhonchi Abdominal: Soft, NT, ND, bowel sounds + Extremities: no edema, no cyanosis    The results of significant diagnostics from this hospitalization (including imaging, microbiology, ancillary and laboratory) are listed below for reference.     Microbiology: No results found for this or any previous visit (from the past 240 hour(s)).   Labs: BNP (last 3 results) Recent Labs    10/15/17 1550 06/24/18 0826  BNP 81.2 16.3   Basic Metabolic Panel: Recent Labs  Lab 06/24/18 0438 06/24/18 0826 06/25/18 0354  NA 137  --  136  K 4.7  --  4.9  CL 106  --  108  CO2 24  --  21*  GLUCOSE 111*  --  108*  BUN 18  --  20  CREATININE 1.82* 1.80* 1.91*  CALCIUM 9.0  --  8.8*   Liver Function Tests: Recent Labs  Lab 06/24/18 0438 06/25/18 0354  AST 19 15  ALT 18 15  ALKPHOS 72 59  BILITOT 0.3 0.5  PROT 7.4 6.2*  ALBUMIN 4.2 3.4*   Recent Labs  Lab 06/24/18 0438  LIPASE 32   No results for input(s): AMMONIA in the last 168 hours. CBC: Recent Labs  Lab 06/24/18 0438 06/24/18 0826 06/25/18 0354  WBC 7.9 6.9 8.1  HGB 13.3 12.5* 11.7*  HCT 43.1 40.5 37.4*  MCV 89.6 92.5 90.8  PLT 242 218 208   Cardiac Enzymes: Recent Labs  Lab 06/24/18 0825 06/24/18 1826  TROPONINI <0.03 <0.03   BNP: Invalid input(s): POCBNP CBG: No results for input(s): GLUCAP in the last  168 hours. D-Dimer No results for input(s): DDIMER in the last 72 hours. Hgb A1c Recent Labs    06/24/18 0438  HGBA1C 5.6   Lipid Profile Recent Labs    06/24/18 0826  CHOL 168  HDL 23*  LDLCALC 114*  TRIG 157*  CHOLHDL 7.3   Thyroid function studies Recent Labs    06/24/18 0924  TSH 1.673   Anemia work up No results for input(s): VITAMINB12, FOLATE, FERRITIN, TIBC, IRON, RETICCTPCT in the last 72 hours. Urinalysis    Component Value Date/Time   COLORURINE YELLOW 06/24/2018 0439   APPEARANCEUR CLEAR 06/24/2018 0439   LABSPEC 1.018 06/24/2018 0439   PHURINE 6.0 06/24/2018 0439   GLUCOSEU NEGATIVE 06/24/2018 0439   HGBUR NEGATIVE 06/24/2018 0439   BILIRUBINUR NEGATIVE 06/24/2018 0439   KETONESUR NEGATIVE 06/24/2018 0439   PROTEINUR 100 (A) 06/24/2018 0439   UROBILINOGEN 0.2 04/17/2011 1030   NITRITE NEGATIVE 06/24/2018 0439   LEUKOCYTESUR NEGATIVE 06/24/2018 0439   Sepsis Labs Invalid input(s): PROCALCITONIN,  WBC,  LACTICIDVEN Microbiology No results found for this or any previous visit (from the past 240 hour(s)).   Time coordinating discharge: 34  minutes  SIGNED:   Georgette Shell, MD  [facility] 06/25/2018, 7:45 AM Pager   If 7PM-7AM, please contact night-coverage [email or web address] Password [email or web address]

## 2018-07-01 ENCOUNTER — Other Ambulatory Visit: Payer: Self-pay

## 2018-07-02 DIAGNOSIS — R079 Chest pain, unspecified: Secondary | ICD-10-CM | POA: Diagnosis not present

## 2018-07-02 DIAGNOSIS — I4892 Unspecified atrial flutter: Secondary | ICD-10-CM | POA: Diagnosis not present

## 2018-07-02 DIAGNOSIS — I129 Hypertensive chronic kidney disease with stage 1 through stage 4 chronic kidney disease, or unspecified chronic kidney disease: Secondary | ICD-10-CM | POA: Diagnosis not present

## 2018-07-08 ENCOUNTER — Other Ambulatory Visit: Payer: Self-pay

## 2018-07-09 ENCOUNTER — Telehealth: Payer: Self-pay

## 2018-07-22 ENCOUNTER — Other Ambulatory Visit: Payer: Self-pay

## 2018-07-23 ENCOUNTER — Other Ambulatory Visit: Payer: PPO

## 2018-07-29 ENCOUNTER — Other Ambulatory Visit: Payer: Self-pay

## 2018-08-06 ENCOUNTER — Other Ambulatory Visit: Payer: PPO

## 2018-08-08 ENCOUNTER — Ambulatory Visit: Payer: PPO | Admitting: Cardiology

## 2018-08-13 DIAGNOSIS — Z950 Presence of cardiac pacemaker: Secondary | ICD-10-CM | POA: Diagnosis not present

## 2018-08-13 DIAGNOSIS — I4439 Other atrioventricular block: Secondary | ICD-10-CM | POA: Diagnosis not present

## 2018-08-13 DIAGNOSIS — Z45018 Encounter for adjustment and management of other part of cardiac pacemaker: Secondary | ICD-10-CM | POA: Diagnosis not present

## 2018-08-19 ENCOUNTER — Other Ambulatory Visit: Payer: Self-pay | Admitting: Cardiology

## 2018-10-14 ENCOUNTER — Other Ambulatory Visit: Payer: Self-pay

## 2018-10-14 MED ORDER — CLONIDINE HCL 0.1 MG PO TABS: 0.1000 mg | ORAL_TABLET | Freq: Two times a day (BID) | ORAL | 0 refills | Status: DC

## 2018-11-05 ENCOUNTER — Emergency Department (HOSPITAL_COMMUNITY): Payer: PPO

## 2018-11-05 ENCOUNTER — Other Ambulatory Visit: Payer: Self-pay

## 2018-11-05 ENCOUNTER — Emergency Department (HOSPITAL_COMMUNITY)
Admission: EM | Admit: 2018-11-05 | Discharge: 2018-11-05 | Disposition: A | Discharge status: Home / Self Care | Payer: PPO | Attending: Emergency Medicine | Admitting: Emergency Medicine

## 2018-11-05 ENCOUNTER — Encounter (HOSPITAL_COMMUNITY): Payer: Self-pay

## 2018-11-05 DIAGNOSIS — W010XXA Fall on same level from slipping, tripping and stumbling without subsequent striking against object, initial encounter: Secondary | ICD-10-CM | POA: Diagnosis not present

## 2018-11-05 DIAGNOSIS — N183 Chronic kidney disease, stage 3 (moderate): Secondary | ICD-10-CM | POA: Insufficient documentation

## 2018-11-05 DIAGNOSIS — I129 Hypertensive chronic kidney disease with stage 1 through stage 4 chronic kidney disease, or unspecified chronic kidney disease: Secondary | ICD-10-CM | POA: Diagnosis not present

## 2018-11-05 DIAGNOSIS — Y92481 Parking lot as the place of occurrence of the external cause: Secondary | ICD-10-CM | POA: Insufficient documentation

## 2018-11-05 DIAGNOSIS — Y9301 Activity, walking, marching and hiking: Secondary | ICD-10-CM | POA: Diagnosis not present

## 2018-11-05 DIAGNOSIS — J45909 Unspecified asthma, uncomplicated: Secondary | ICD-10-CM | POA: Insufficient documentation

## 2018-11-05 DIAGNOSIS — S62111A Displaced fracture of triquetrum [cuneiform] bone, right wrist, initial encounter for closed fracture: Secondary | ICD-10-CM | POA: Diagnosis not present

## 2018-11-05 DIAGNOSIS — Z950 Presence of cardiac pacemaker: Secondary | ICD-10-CM | POA: Diagnosis not present

## 2018-11-05 DIAGNOSIS — M79641 Pain in right hand: Secondary | ICD-10-CM | POA: Diagnosis not present

## 2018-11-05 DIAGNOSIS — Z79899 Other long term (current) drug therapy: Secondary | ICD-10-CM | POA: Diagnosis not present

## 2018-11-05 DIAGNOSIS — Y999 Unspecified external cause status: Secondary | ICD-10-CM | POA: Insufficient documentation

## 2018-11-05 DIAGNOSIS — S6991XA Unspecified injury of right wrist, hand and finger(s), initial encounter: Secondary | ICD-10-CM | POA: Diagnosis present

## 2018-11-05 DIAGNOSIS — Z87891 Personal history of nicotine dependence: Secondary | ICD-10-CM | POA: Insufficient documentation

## 2018-11-05 MED ORDER — ACETAMINOPHEN 325 MG PO TABS
650.0000 mg | ORAL_TABLET | Freq: Once | ORAL | Status: AC
  Administered 2018-11-05: 650 mg via ORAL
  Filled 2018-11-05: qty 2

## 2018-11-05 MED ORDER — HYDRALAZINE HCL 50 MG PO TABS
50.0000 mg | ORAL_TABLET | Freq: Once | ORAL | Status: AC
  Administered 2018-11-05: 50 mg via ORAL
  Filled 2018-11-05: qty 1

## 2018-11-05 MED ORDER — BENAZEPRIL HCL 10 MG PO TABS
10.0000 mg | ORAL_TABLET | Freq: Once | ORAL | Status: AC
  Administered 2018-11-05: 10 mg via ORAL
  Filled 2018-11-05: qty 1

## 2018-11-05 MED ORDER — METOPROLOL TARTRATE 25 MG PO TABS
100.0000 mg | ORAL_TABLET | Freq: Once | ORAL | Status: AC
  Administered 2018-11-05: 100 mg via ORAL
  Filled 2018-11-05: qty 4

## 2018-11-05 MED ORDER — CLONIDINE HCL 0.1 MG PO TABS
0.1000 mg | ORAL_TABLET | Freq: Once | ORAL | Status: AC
  Administered 2018-11-05: 0.1 mg via ORAL
  Filled 2018-11-05: qty 1

## 2018-11-05 NOTE — ED Provider Notes (Signed)
[place] [facility] DEPT Provider Note   CSN: [id number] Arrival date & time: 11/05/18  2751    History   Chief Complaint Chief Complaint  Patient presents with  . Fall  . Wrist Pain    HPI Peter Marsh is a 58 y.o. male.     HPI  58 year old male presents with fall.  He states he was leaving a store yesterday evening around 5 PM and lost his step while walking over a curb and fell.  He thinks he lost consciousness for about 5 seconds after the fall.  He denies any current headache and has not had any vomiting.  No weakness or numbness.  No presyncope symptoms.  He is complaining of right ulnar wrist pain.  No numbness.  He transiently had some left thumb pain but this went away after ice.  He also has knee abrasions and had pain but states that this is not painful currently.  He has been ambulatory.  He is noted to have high blood pressure on triage vital signs and states he did not take his blood pressure medicines this morning. No blood thinners.  Past Medical History:  Diagnosis Date  . Asthma    uses inhalers   . Bilateral carotid bruits   . Cardiac conduction disorder 2018   s/p MDT PPM  . Chronic kidney disease    bladder interstial cystitis   . Chronic kidney disease (CKD), stage IV (severe) ([name])    [facility] Jo[facility]  . Depression   . GERD (gastroesophageal reflux disease)   . History of stomach ulcers 2001  . Hypertension   . LBBB (left bundle branch block)   . Lower extremity edema   . Mild intermittent asthma without complication   . Mixed hyperlipidemia   . Paroxysmal atrial flutter ([place])   . PONV (postoperative nausea and vomiting)   . Schizophrenia [facility])     Patient Active Problem List   Diagnosis Date Noted  . Chest pain 06/24/2018  . Essential hypertension 06/24/2018  . Pacemaker 06/24/2018  . Wide QRS ventricular tachycardia ([place]) 02/26/2017  . Coffee ground emesis 09/16/2016  . CKD (chronic  kidney disease), stage III ([place]) 09/16/2016  . Schizophrenia ([place]) 09/16/2016  . Paroxysmal atrial fibrillation ([place]) 09/16/2016  . Upper GI bleed 09/16/2016  . Cough variant asthma 02/09/2016  . Dyspnea 02/09/2016  . BRBPR (bright red blood per rectum) 06/18/2013  . Herniated lumbar intervertebral disc 04/20/2011    Past Surgical History:  Procedure Laterality Date  . APPENDECTOMY    . ELECTROPHYSIOLOGY STUDY N/A 02/26/2017   Procedure: ELECTROPHYSIOLOGY STUDY;  Surgeon: Evans Lance, MD;  Location: [place] CV LAB;  Service: Cardiovascular;  Laterality: N/A;  . ESOPHAGOGASTRODUODENOSCOPY (EGD) WITH PROPOFOL N/A 09/18/2016   Procedure: ESOPHAGOGASTRODUODENOSCOPY (EGD) WITH PROPOFOL;  Surgeon: Ronnette Juniper, MD;  Location: [facility];  Service: Gastroenterology;  Laterality: N/A;  . LUMBAR LAMINECTOMY/DECOMPRESSION MICRODISCECTOMY  04/19/2011   Procedure: LUMBAR LAMINECTOMY/DECOMPRESSION MICRODISCECTOMY;  Surgeon: Tobi Bastos;  Location: [facility];  Service: Orthopedics;  Laterality: Left;  Hemi LAminectomy/Microdiscectomy Lumbar four  - Lumbar five  on the Left (X-Ray)  . PACEMAKER IMPLANT N/A 02/26/2017   Procedure: PACEMAKER IMPLANT;  Surgeon: Evans Lance, MD;  Location: [place] CV LAB;  Service: Cardiovascular;  Laterality: N/A;  . TONSILLECTOMY          Home Medications    Prior to Admission medications   Medication Sig Start Date End Date Taking?  Authorizing Provider  acetaminophen (TYLENOL) 325 MG tablet Take 650 mg by mouth every 6 (six) hours as needed for mild pain or headache.   Yes [provider]  albuterol (PROVENTIL HFA;VENTOLIN HFA) 108 (90 BASE) MCG/ACT inhaler Inhale 2 puffs into the lungs every 6 (six) hours as needed for wheezing or shortness of breath.    Yes [provider]  benazepril (LOTENSIN) 10 MG tablet Take 10 mg by mouth daily. 04/24/18  Yes [provider]  budesonide-formoterol (SYMBICORT) 80-4.5 MCG/ACT  inhaler Inhale 2 puffs into the lungs 2 (two) times daily. 02/09/16  Yes Tanda Rockers, MD  buPROPion [facility] SR) 150 MG 12 hr tablet Take 150 mg by mouth 2 (two) times daily.    Yes [provider]  cloNIDine (CATAPRES) 0.1 MG tablet Take 1 tablet (0.1 mg total) by mouth 2 (two) times daily. 10/14/18 10/14/19 Yes Adrian Prows, MD  hydrALAZINE (APRESOLINE) 50 MG tablet Take 50 mg by mouth 3 (three) times daily. 04/11/16  Yes [provider]  metoprolol tartrate (LOPRESSOR) 100 MG tablet TAKE 1 TABLET BY MOUTH TWICE DAILY Patient taking differently: Take 100 mg by mouth 2 (two) times daily.  08/20/18  Yes Adrian Prows, MD  montelukast (SINGULAIR) 10 MG tablet Take 10 mg by mouth at bedtime. 01/17/16  Yes [provider]  paliperidone (INVEGA) 6 MG 24 hr tablet Take 6 mg by mouth at bedtime.   Yes [provider]  pantoprazole (PROTONIX) 40 MG tablet Take 1 tablet (40 mg total) by mouth 2 (two) times daily. 09/19/16  Yes Aline August, MD  sucralfate (CARAFATE) 1 g tablet Take 1 g by mouth 4 (four) times daily.  07/20/17  Yes [provider]    Family History Family History  Problem Relation Age of Onset  . High blood pressure Mother   . Alzheimer's disease Father     Social History Social History   Tobacco Use  . Smoking status: Former Smoker    Packs/day: 1.00    Years: 30.00    Pack years: 30.00    Quit date: 05/08/2008    Years since quitting: 10.5  . Smokeless tobacco: Never Used  Substance Use Topics  . Alcohol use: No  . Drug use: No     Allergies   Methylpyrrolidone, Niacin, Norvasc [amlodipine besylate], Oxybutynin chloride [oxybutynin chloride er], Vesicare [solifenacin succinate], Ciprofloxacin, Oxybutynin, and Solifenacin   Review of Systems Review of Systems  Eyes: Negative for visual disturbance.  Cardiovascular: Negative for chest pain.  Gastrointestinal: Negative for vomiting.  Musculoskeletal: Positive for arthralgias and  joint swelling.  Skin: Positive for wound (abrasion).  Neurological: Negative for dizziness, weakness, numbness and headaches.  All other systems reviewed and are negative.    Physical Exam Updated Vital Signs BP (!) 181/110   Pulse (!) 59   Temp 98.1 F (36.7 C) (Oral)   Resp 20   Ht 6' (1.829 m)   Wt 122.5 kg   SpO2 99%   BMI 36.62 kg/m   Physical Exam Vitals signs and nursing note reviewed.  Constitutional:      General: He is not in acute distress.    Appearance: He is well-developed. He is not ill-appearing or diaphoretic.  HENT:     Head: Normocephalic and atraumatic.     Comments: No obvious scalp/head injury    Right Ear: External ear normal. No hemotympanum.     Left Ear: External ear normal. No hemotympanum.     Nose: Nose  normal.  Eyes:     General:        Right eye: No discharge.        Left eye: No discharge.     Extraocular Movements: Extraocular movements intact.     Pupils: Pupils are equal, round, and reactive to light.  Neck:     Musculoskeletal: Neck supple.  Cardiovascular:     Rate and Rhythm: Normal rate and regular rhythm.     Pulses:          Radial pulses are 2+ on the right side and 2+ on the left side.     Heart sounds: Normal heart sounds.  Pulmonary:     Effort: Pulmonary effort is normal.     Breath sounds: Normal breath sounds.  Abdominal:     Palpations: Abdomen is soft.     Tenderness: There is no abdominal tenderness.  Musculoskeletal:     Right wrist: He exhibits decreased range of motion, tenderness and swelling.     Left wrist: He exhibits normal range of motion and no tenderness.     Left hip: He exhibits normal range of motion.     Left knee: He exhibits normal range of motion. No tenderness found.       Arms:     Right hand: He exhibits tenderness. He exhibits no swelling.     Left hand: He exhibits no tenderness and no swelling.       Hands:     Left lower leg: He exhibits no tenderness.       Legs:     Comments:  Normal radial, ulnar and median nerve testing in right hand Normal strength/sensation  Skin:    General: Skin is warm and dry.  Neurological:     Mental Status: He is alert.     Comments: CN 3-12 grossly intact. 5/5 strength in all 4 extremities. Grossly normal sensation. Normal finger to nose.   Psychiatric:        Mood and Affect: Mood is not anxious.      ED Treatments / Results  Labs (all labs ordered are listed, but only abnormal results are displayed) Labs Reviewed - No data to display  EKG None  Radiology Dg Wrist Complete Right  Result Date: 11/05/2018 CLINICAL DATA:  Right wrist and hand pain secondary to a fall off of a curb. EXAM: RIGHT WRIST - COMPLETE 3+ VIEW COMPARISON:  None. FINDINGS: There is a prominent dorsal avulsion fracture of the triquetrum seen on the lateral view. No dislocation. The other bones of the wrist appear intact. IMPRESSION: Fracture of the triquetrum. Electronically Signed   By: Lorriane Shire M.D.   On: 11/05/2018 09:40   Dg Hand Complete Right  Result Date: 11/05/2018 CLINICAL DATA:  Right hand pain secondary to a fall off of a curb. EXAM: RIGHT HAND - COMPLETE 3+ VIEW COMPARISON:  None. FINDINGS: There are avulsion fractures from the dorsal aspect of the triquetrum. The other bones of the hand and wrist are intact. IMPRESSION: Fracture of the triquetrum. Electronically Signed   By: Lorriane Shire M.D.   On: 11/05/2018 09:41    Procedures Procedures (including critical care time)  Medications Ordered in ED Medications  acetaminophen (TYLENOL) tablet 650 mg (650 mg Oral Given 11/05/18 0937)  benazepril (LOTENSIN) tablet 10 mg (10 mg Oral Given 11/05/18 0950)  cloNIDine (CATAPRES) tablet 0.1 mg (0.1 mg Oral Given 11/05/18 0937)  hydrALAZINE (APRESOLINE) tablet 50 mg (50 mg Oral Given 11/05/18 0951)  metoprolol tartrate (LOPRESSOR) tablet 100 mg (100 mg Oral Given 11/05/18 0936)     Initial Impression / Assessment and Plan / ED Course  I have  reviewed the triage vital signs and the nursing notes.  Pertinent labs & imaging results that were available during my care of the patient were reviewed by me and considered in my medical decision making (see chart for details).        Patient with mechanical fall. Xray shows triquetrum fracture. Will place in splint and refer to hand surgery. He declines anything stronger than tylenol. He hit his head with brief LOC over 12 hours ago. Neurologically intact, no headache, vomiting, etc. Through shared decision making we decided not to get head CT and I think head bleed/skull fracture is unlikely. BP better after his po meds.  Final Clinical Impressions(s) / ED Diagnoses   Final diagnoses:  Closed displaced fracture of triquetrum of right wrist, initial encounter    ED Discharge Orders    None       Sherwood Gambler, MD 11/05/18 [phone]

## 2018-11-05 NOTE — ED Triage Notes (Signed)
Patient reports that he fell off of a curb and now c/o right wrist pain. Patient states he had LOC. Patient denies taking a blood thinner.

## 2018-11-05 NOTE — ED Notes (Signed)
Pt d/c home per MD order. Discharge summary reviewed, pt verbalizes understanding. Ambulatory off unit. No s/s of acute distress noted, reports wife is discharge ride home.

## 2018-11-05 NOTE — ED Notes (Signed)
EDP at bedside  

## 2018-11-11 ENCOUNTER — Ambulatory Visit: Payer: PPO

## 2018-11-11 DIAGNOSIS — M25531 Pain in right wrist: Secondary | ICD-10-CM | POA: Diagnosis not present

## 2018-11-11 DIAGNOSIS — S62111A Displaced fracture of triquetrum [cuneiform] bone, right wrist, initial encounter for closed fracture: Secondary | ICD-10-CM | POA: Diagnosis not present

## 2018-11-12 DIAGNOSIS — Z45018 Encounter for adjustment and management of other part of cardiac pacemaker: Secondary | ICD-10-CM | POA: Diagnosis not present

## 2018-11-12 DIAGNOSIS — I4439 Other atrioventricular block: Secondary | ICD-10-CM | POA: Diagnosis not present

## 2018-11-12 DIAGNOSIS — Z950 Presence of cardiac pacemaker: Secondary | ICD-10-CM | POA: Diagnosis not present

## 2018-11-14 DIAGNOSIS — K861 Other chronic pancreatitis: Secondary | ICD-10-CM | POA: Diagnosis not present

## 2018-11-14 DIAGNOSIS — Z86010 Personal history of colonic polyps: Secondary | ICD-10-CM | POA: Diagnosis not present

## 2018-11-14 DIAGNOSIS — I4892 Unspecified atrial flutter: Secondary | ICD-10-CM | POA: Diagnosis not present

## 2018-11-14 DIAGNOSIS — Z8719 Personal history of other diseases of the digestive system: Secondary | ICD-10-CM | POA: Diagnosis not present

## 2018-11-14 DIAGNOSIS — K2270 Barrett's esophagus without dysplasia: Secondary | ICD-10-CM | POA: Diagnosis not present

## 2018-11-18 DIAGNOSIS — F250 Schizoaffective disorder, bipolar type: Secondary | ICD-10-CM | POA: Diagnosis not present

## 2018-11-19 ENCOUNTER — Inpatient Hospital Stay (HOSPITAL_COMMUNITY)
Admission: EM | Admit: 2018-11-19 | Discharge: 2018-11-21 | DRG: 078 | Disposition: A | Discharge status: Home Health | Payer: PPO | Attending: Internal Medicine | Admitting: Internal Medicine

## 2018-11-19 ENCOUNTER — Emergency Department (HOSPITAL_COMMUNITY): Payer: PPO

## 2018-11-19 ENCOUNTER — Observation Stay (HOSPITAL_COMMUNITY): Payer: PPO

## 2018-11-19 DIAGNOSIS — Z79899 Other long term (current) drug therapy: Secondary | ICD-10-CM | POA: Diagnosis not present

## 2018-11-19 DIAGNOSIS — Z6830 Body mass index (BMI) 30.0-30.9, adult: Secondary | ICD-10-CM | POA: Diagnosis not present

## 2018-11-19 DIAGNOSIS — Z1159 Encounter for screening for other viral diseases: Secondary | ICD-10-CM

## 2018-11-19 DIAGNOSIS — R4182 Altered mental status, unspecified: Secondary | ICD-10-CM | POA: Diagnosis present

## 2018-11-19 DIAGNOSIS — R404 Transient alteration of awareness: Secondary | ICD-10-CM | POA: Diagnosis not present

## 2018-11-19 DIAGNOSIS — J4520 Mild intermittent asthma, uncomplicated: Secondary | ICD-10-CM | POA: Diagnosis present

## 2018-11-19 DIAGNOSIS — I674 Hypertensive encephalopathy: Secondary | ICD-10-CM | POA: Diagnosis present

## 2018-11-19 DIAGNOSIS — F329 Major depressive disorder, single episode, unspecified: Secondary | ICD-10-CM | POA: Diagnosis present

## 2018-11-19 DIAGNOSIS — K219 Gastro-esophageal reflux disease without esophagitis: Secondary | ICD-10-CM

## 2018-11-19 DIAGNOSIS — I161 Hypertensive emergency: Secondary | ICD-10-CM | POA: Diagnosis present

## 2018-11-19 DIAGNOSIS — R000 Tachycardia, unspecified: Secondary | ICD-10-CM | POA: Diagnosis not present

## 2018-11-19 DIAGNOSIS — G9341 Metabolic encephalopathy: Secondary | ICD-10-CM | POA: Diagnosis not present

## 2018-11-19 DIAGNOSIS — Z7951 Long term (current) use of inhaled steroids: Secondary | ICD-10-CM

## 2018-11-19 DIAGNOSIS — F209 Schizophrenia, unspecified: Secondary | ICD-10-CM | POA: Diagnosis present

## 2018-11-19 DIAGNOSIS — R4702 Dysphasia: Secondary | ICD-10-CM | POA: Diagnosis not present

## 2018-11-19 DIAGNOSIS — Z950 Presence of cardiac pacemaker: Secondary | ICD-10-CM | POA: Diagnosis not present

## 2018-11-19 DIAGNOSIS — F29 Unspecified psychosis not due to a substance or known physiological condition: Secondary | ICD-10-CM | POA: Diagnosis not present

## 2018-11-19 DIAGNOSIS — N183 Chronic kidney disease, stage 3 unspecified: Secondary | ICD-10-CM | POA: Diagnosis present

## 2018-11-19 DIAGNOSIS — N1832 Chronic kidney disease, stage 3b: Secondary | ICD-10-CM | POA: Diagnosis present

## 2018-11-19 DIAGNOSIS — R569 Unspecified convulsions: Secondary | ICD-10-CM | POA: Diagnosis not present

## 2018-11-19 DIAGNOSIS — Z03818 Encounter for observation for suspected exposure to other biological agents ruled out: Secondary | ICD-10-CM | POA: Diagnosis not present

## 2018-11-19 DIAGNOSIS — I129 Hypertensive chronic kidney disease with stage 1 through stage 4 chronic kidney disease, or unspecified chronic kidney disease: Secondary | ICD-10-CM | POA: Diagnosis present

## 2018-11-19 DIAGNOSIS — R479 Unspecified speech disturbances: Secondary | ICD-10-CM | POA: Diagnosis not present

## 2018-11-19 DIAGNOSIS — I480 Paroxysmal atrial fibrillation: Secondary | ICD-10-CM | POA: Diagnosis present

## 2018-11-19 DIAGNOSIS — E876 Hypokalemia: Secondary | ICD-10-CM | POA: Diagnosis present

## 2018-11-19 DIAGNOSIS — G9340 Encephalopathy, unspecified: Secondary | ICD-10-CM | POA: Diagnosis present

## 2018-11-19 DIAGNOSIS — I10 Essential (primary) hypertension: Secondary | ICD-10-CM | POA: Diagnosis present

## 2018-11-19 DIAGNOSIS — R4701 Aphasia: Secondary | ICD-10-CM

## 2018-11-19 DIAGNOSIS — I639 Cerebral infarction, unspecified: Secondary | ICD-10-CM | POA: Diagnosis not present

## 2018-11-19 DIAGNOSIS — E669 Obesity, unspecified: Secondary | ICD-10-CM | POA: Diagnosis present

## 2018-11-19 DIAGNOSIS — E782 Mixed hyperlipidemia: Secondary | ICD-10-CM | POA: Diagnosis present

## 2018-11-19 DIAGNOSIS — Z87891 Personal history of nicotine dependence: Secondary | ICD-10-CM | POA: Diagnosis not present

## 2018-11-19 DIAGNOSIS — J45909 Unspecified asthma, uncomplicated: Secondary | ICD-10-CM

## 2018-11-19 DIAGNOSIS — R451 Restlessness and agitation: Secondary | ICD-10-CM | POA: Diagnosis not present

## 2018-11-19 DIAGNOSIS — I447 Left bundle-branch block, unspecified: Secondary | ICD-10-CM | POA: Diagnosis not present

## 2018-11-19 LAB — DIFFERENTIAL
Abs Immature Granulocytes: 0.02 10*3/uL (ref 0.00–0.07)
Basophils Absolute: 0 10*3/uL (ref 0.0–0.1)
Basophils Relative: 0 %
Eosinophils Absolute: 0.2 10*3/uL (ref 0.0–0.5)
Eosinophils Relative: 2 %
Immature Granulocytes: 0 %
Lymphocytes Relative: 28 %
Lymphs Abs: 2.4 10*3/uL (ref 0.7–4.0)
Monocytes Absolute: 1.1 10*3/uL — ABNORMAL HIGH (ref 0.1–1.0)
Monocytes Relative: 13 %
Neutro Abs: 4.9 10*3/uL (ref 1.7–7.7)
Neutrophils Relative %: 57 %

## 2018-11-19 LAB — COMPREHENSIVE METABOLIC PANEL
ALT: 17 U/L (ref 0–44)
AST: 22 U/L (ref 15–41)
Albumin: 4.1 g/dL (ref 3.5–5.0)
Alkaline Phosphatase: 59 U/L (ref 38–126)
Anion gap: 12 (ref 5–15)
BUN: 15 mg/dL (ref 6–20)
CO2: 21 mmol/L — ABNORMAL LOW (ref 22–32)
Calcium: 9.7 mg/dL (ref 8.9–10.3)
Chloride: 108 mmol/L (ref 98–111)
Creatinine, Ser: 2.08 mg/dL — ABNORMAL HIGH (ref 0.61–1.24)
GFR calc Af Amer: 39 mL/min — ABNORMAL LOW (ref >60)
GFR calc non Af Amer: 34 mL/min — ABNORMAL LOW (ref >60)
Glucose, Bld: 103 mg/dL — ABNORMAL HIGH (ref 70–99)
Potassium: 3.4 mmol/L — ABNORMAL LOW (ref 3.5–5.1)
Sodium: 141 mmol/L (ref 135–145)
Total Bilirubin: 1.2 mg/dL (ref 0.3–1.2)
Total Protein: 7.2 g/dL (ref 6.5–8.1)

## 2018-11-19 LAB — CBC
HCT: 45.7 % (ref 39.0–52.0)
Hemoglobin: 14.5 g/dL (ref 13.0–17.0)
MCH: 27.9 pg (ref 26.0–34.0)
MCHC: 31.7 g/dL (ref 30.0–36.0)
MCV: 87.9 fL (ref 80.0–100.0)
Platelets: 234 10*3/uL (ref 150–400)
RBC: 5.2 MIL/uL (ref 4.22–5.81)
RDW: 12.9 % (ref 11.5–15.5)
WBC: 8.6 10*3/uL (ref 4.0–10.5)
nRBC: 0 % (ref 0.0–0.2)

## 2018-11-19 LAB — I-STAT CHEM 8, ED
BUN: 17 mg/dL (ref 6–20)
Calcium, Ion: 1.14 mmol/L — ABNORMAL LOW (ref 1.15–1.40)
Chloride: 107 mmol/L (ref 98–111)
Creatinine, Ser: 2 mg/dL — ABNORMAL HIGH (ref 0.61–1.24)
Glucose, Bld: 102 mg/dL — ABNORMAL HIGH (ref 70–99)
HCT: 44 % (ref 39.0–52.0)
Hemoglobin: 15 g/dL (ref 13.0–17.0)
Potassium: 3.4 mmol/L — ABNORMAL LOW (ref 3.5–5.1)
Sodium: 142 mmol/L (ref 135–145)
TCO2: 24 mmol/L (ref 22–32)

## 2018-11-19 LAB — PROTIME-INR
INR: 1.1 (ref 0.8–1.2)
Prothrombin Time: 14.4 seconds (ref 11.4–15.2)

## 2018-11-19 LAB — ETHANOL: Alcohol, Ethyl (B): <10 mg/dL (ref <10)

## 2018-11-19 LAB — APTT: aPTT: 29 seconds (ref 24–36)

## 2018-11-19 MED ORDER — ZIPRASIDONE MESYLATE 20 MG IM SOLR
10.0000 mg | Freq: Once | INTRAMUSCULAR | Status: AC
  Administered 2018-11-19: 10 mg via INTRAMUSCULAR
  Filled 2018-11-19: qty 20

## 2018-11-19 MED ORDER — STERILE WATER FOR INJECTION IJ SOLN
INTRAMUSCULAR | Status: AC
  Filled 2018-11-19: qty 10

## 2018-11-19 MED ORDER — SODIUM CHLORIDE 0.9 % IV SOLN
1000.0000 mL | INTRAVENOUS | Status: DC
  Administered 2018-11-19: 1000 mL via INTRAVENOUS

## 2018-11-19 MED ORDER — IOHEXOL 350 MG/ML SOLN
100.0000 mL | Freq: Once | INTRAVENOUS | Status: AC | PRN
  Administered 2018-11-19: 100 mL via INTRAVENOUS

## 2018-11-19 MED ORDER — SODIUM CHLORIDE 0.9 % IV BOLUS (SEPSIS)
500.0000 mL | Freq: Once | INTRAVENOUS | Status: AC
  Administered 2018-11-19: 500 mL via INTRAVENOUS

## 2018-11-19 MED ORDER — LABETALOL HCL 5 MG/ML IV SOLN
10.0000 mg | Freq: Once | INTRAVENOUS | Status: AC
  Administered 2018-11-19: 10 mg via INTRAVENOUS
  Filled 2018-11-19: qty 4

## 2018-11-19 NOTE — H&P (Signed)
History and Physical    Peter Marsh BHA:[id number] DOB: 1961-02-22 DOA: 11/19/2018  PCP: Marda Stalker, PA-C  Patient coming from: Home via EMS  I have personally briefly reviewed patient's old medical records in [place]  Chief Complaint: Altered mental status  HPI: Peter Marsh is a 58 y.o. male with medical history significant for conduction system disease s/p PPM not on anticoagulation, CKD stage III, hypertension, hyperlipidemia, LBBB, GERD, esophageal ulcer, asthma, and schizophrenia who presents the ED for evaluation of agitation and aphasia.  Patient states he was in his usual state of health until 11/19/2018 when he had sudden onset of agitation, dysarthria, and reported expressive aphasia.  Patient also reports feeling like he was having tremors all over his body.  He was experiencing palpitations without chest pain.  He was having intermittent dyspnea.  He reports recent dark urine with dysuria.  He reports drenching sweats every night.  Patient states he was previously taking aspirin however it was discontinued due to bleeding from his stomach 2 years ago.  He denies any recent bleeding.  EMS were called and per ED documentation patient was noted to have BP 200/116 on arrival with O2 saturation 97% on room air.  ED Course:  Initial vitals showed BP 170/92, pulse 89, RR 22, SPO2 95% on room air.  Labs are notable for sodium 141, potassium 3.4, bicarb 21, BUN 15, creatinine 2.08, GFR 34, AST 22, ALT 17, alk phos 59, T bili 1.2, WBC 8.6, hemoglobin 14.5, platelets 234,000.  Serum ethanol level is undetectable.  Patient initially arrived to ED as a code stroke.  Neurology were consulted and evaluated the patient.  CT head without contrast was negative for acute intracranial abnormality.  CTA head/neck and CT cerebral perfusion scan with contrast were obtained and were negative for large vessel occlusion without core infarct or ischemic penumbra.  After imaging  findings resulted, it was felt that patient symptoms were most consistent with acute psychosis or atypical presentation of receptive aphasia.  Patient was given 500 mLs normal saline bolus followed by maintenance IV fluids.  He was given IV labetalol 10 mg once with recommended acute BP management for goal SBP less than 160.  Due to significant agitation patient was given IV Geodon 10 mg once.  The hospitalist service was consulted to admit for further evaluation and management.   Review of Systems: All systems reviewed and are negative except as documented in history of present illness above.   Past Medical History:  Diagnosis Date   Asthma    uses inhalers    Bilateral carotid bruits    Cardiac conduction disorder 2018   s/p MDT PPM   Chronic kidney disease    bladder interstial cystitis    Chronic kidney disease (CKD), stage IV (severe) ([name])    followed by Dr. Joelyn Oms at [facility]   Depression    GERD (gastroesophageal reflux disease)    History of stomach ulcers 2001   Hypertension    LBBB (left bundle branch block)    Lower extremity edema    Mild intermittent asthma without complication    Mixed hyperlipidemia    Paroxysmal atrial flutter ([name])    PONV (postoperative nausea and vomiting)    Schizophrenia ([place])     Past Surgical History:  Procedure Laterality Date   APPENDECTOMY     ELECTROPHYSIOLOGY STUDY N/A 02/26/2017   Procedure: ELECTROPHYSIOLOGY STUDY;  Surgeon: Evans Lance, MD;  Location: [place] [facility];  Service:  Cardiovascular;  Laterality: N/A;   ESOPHAGOGASTRODUODENOSCOPY (EGD) WITH PROPOFOL N/A 09/18/2016   Procedure: ESOPHAGOGASTRODUODENOSCOPY (EGD) WITH PROPOFOL;  Surgeon: Ronnette Juniper, MD;  Location: [facility];  Service: Gastroenterology;  Laterality: N/A;   LUMBAR LAMINECTOMY/DECOMPRESSION MICRODISCECTOMY  04/19/2011   Procedure: LUMBAR LAMINECTOMY/DECOMPRESSION MICRODISCECTOMY;  Surgeon: Tobi Bastos;  Location: [facility];  Service: Orthopedics;  Laterality: Left;  Hemi LAminectomy/Microdiscectomy Lumbar four  - Lumbar five  on the Left (X-Ray)   PACEMAKER IMPLANT N/A 02/26/2017   Procedure: PACEMAKER IMPLANT;  Surgeon: Evans Lance, MD;  Location: [place] [facility];  Service: Cardiovascular;  Laterality: N/A;   TONSILLECTOMY      Social History:  reports that he quit smoking about 10 years ago. He has a 30.00 pack-year smoking history. He has never used smokeless tobacco. He reports that he does not drink alcohol or use drugs.  Allergies  Allergen Reactions   Methylpyrrolidone Hives    froze the intestine   Niacin Other (See Comments) and Nausea And Vomiting    Flushing, itching, tingling    Norvasc [Amlodipine Besylate]     Swollen Feet   Oxybutynin Chloride [Oxybutynin Chloride Er] Other (See Comments)    froze the intestine   Vesicare [Solifenacin Succinate] Other (See Comments)    Froze the intestine    Ciprofloxacin Other (See Comments) and Rash    Felt flushed Unknown    Oxybutynin Rash    unknown   Solifenacin Rash    unknown    Family History  Problem Relation Age of Onset   High blood pressure Mother    Alzheimer's disease Father      Prior to Admission medications   Medication Sig Start Date End Date Taking? Authorizing Provider  acetaminophen (TYLENOL) 325 MG tablet Take 650 mg by mouth every 6 (six) hours as needed for mild pain or headache.    [provider]  albuterol (PROVENTIL HFA;VENTOLIN HFA) 108 (90 BASE) MCG/ACT inhaler Inhale 2 puffs into the lungs every 6 (six) hours as needed for wheezing or shortness of breath.     [provider]  benazepril (LOTENSIN) 10 MG tablet Take 10 mg by mouth daily. 04/24/18   [provider]  budesonide-formoterol (SYMBICORT) 80-4.5 MCG/ACT inhaler Inhale 2 puffs into the lungs 2 (two) times daily. 02/09/16   Tanda Rockers, MD  buPROPion (WELLBUTRIN SR) 150 MG 12 hr tablet Take 150 mg by  mouth 2 (two) times daily.     [provider]  cloNIDine (CATAPRES) 0.1 MG tablet Take 1 tablet (0.1 mg total) by mouth 2 (two) times daily. 10/14/18 10/14/19  Adrian Prows, MD  hydrALAZINE (APRESOLINE) 50 MG tablet Take 50 mg by mouth 3 (three) times daily. 04/11/16   [provider]  metoprolol tartrate (LOPRESSOR) 100 MG tablet TAKE 1 TABLET BY MOUTH TWICE DAILY Patient taking differently: Take 100 mg by mouth 2 (two) times daily.  08/20/18   Adrian Prows, MD  montelukast (SINGULAIR) 10 MG tablet Take 10 mg by mouth at bedtime. 01/17/16   [provider]  paliperidone (INVEGA) 6 MG 24 hr tablet Take 6 mg by mouth at bedtime.    [provider]  pantoprazole (PROTONIX) 40 MG tablet Take 1 tablet (40 mg total) by mouth 2 (two) times daily. 09/19/16   Aline August, MD  sucralfate (CARAFATE) 1 g tablet Take 1 g by mouth 4 (four) times daily.  07/20/17   [provider]    Physical Exam: Vitals:  11/19/18 2145 11/19/18 2200 11/19/18 2215 11/19/18 2230  BP: (!) 183/89 (!) 170/92 (!) 168/115 (!) 183/89  Pulse: 94 89 91 90  Resp: (!) 27 (!) 22 20   SpO2: 94% 95% 94% 96%  Weight:      Height:        Constitutional: Obese man resting supine in bed, NAD, calm, comfortable Eyes: PERRL, lids and conjunctivae normal ENMT: Mucous membranes are dry. Posterior pharynx clear of any exudate or lesions.Normal dentition.  Neck: normal, supple, no masses. Respiratory: clear to auscultation bilaterally, no wheezing, no crackles. Normal respiratory effort. No accessory muscle use.  Cardiovascular: Regular rate and rhythm, no murmurs / rubs / gallops. No extremity edema. 2+ pedal pulses.  PPM in place left chest wall. Abdomen: Mild generalized tenderness, no masses palpated. No hepatosplenomegaly. Bowel sounds positive.  Musculoskeletal: no clubbing / cyanosis. No joint deformity upper and lower extremities. Good ROM, no contractures. Normal muscle tone.  Skin: no rashes,  lesions, ulcers. No induration Neurologic: CN 2-12 grossly intact. Sensation intact, Strength 5/5 in all 4.  No dysmetria.  Speech intact. Psychiatric:  Alert and oriented x 3.  Occasional tangential speech.    Labs on Admission: I have personally reviewed following labs and imaging studies  CBC: Recent Labs  Lab 11/19/18 2107 11/19/18 2112  WBC 8.6  --   NEUTROABS 4.9  --   HGB 14.5 15.0  HCT 45.7 44.0  MCV 87.9  --   PLT 234  --    Basic Metabolic Panel: Recent Labs  Lab 11/19/18 2107 11/19/18 2112  NA 141 142  K 3.4* 3.4*  CL 108 107  CO2 21*  --   GLUCOSE 103* 102*  BUN 15 17  CREATININE 2.08* 2.00*  CALCIUM 9.7  --    GFR: Estimated Creatinine Clearance: 49.5 mL/min (A) (by C-G formula based on SCr of 2 mg/dL (H)). Liver Function Tests: Recent Labs  Lab 11/19/18 2107  AST 22  ALT 17  ALKPHOS 59  BILITOT 1.2  PROT 7.2  ALBUMIN 4.1   No results for input(s): LIPASE, AMYLASE in the last 168 hours. No results for input(s): AMMONIA in the last 168 hours. Coagulation Profile: Recent Labs  Lab 11/19/18 2107  INR 1.1   Cardiac Enzymes: No results for input(s): CKTOTAL, CKMB, CKMBINDEX, TROPONINI in the last 168 hours. BNP (last 3 results) No results for input(s): PROBNP in the last 8760 hours. HbA1C: No results for input(s): HGBA1C in the last 72 hours. CBG: No results for input(s): GLUCAP in the last 168 hours. Lipid Profile: No results for input(s): CHOL, HDL, LDLCALC, TRIG, CHOLHDL, LDLDIRECT in the last 72 hours. Thyroid Function Tests: No results for input(s): TSH, T4TOTAL, FREET4, T3FREE, THYROIDAB in the last 72 hours. Anemia Panel: No results for input(s): VITAMINB12, FOLATE, FERRITIN, TIBC, IRON, RETICCTPCT in the last 72 hours. Urine analysis:    Component Value Date/Time   COLORURINE YELLOW 06/24/2018 0439   APPEARANCEUR CLEAR 06/24/2018 0439   LABSPEC 1.018 06/24/2018 0439   PHURINE 6.0 06/24/2018 0439   GLUCOSEU NEGATIVE 06/24/2018  0439   HGBUR NEGATIVE 06/24/2018 0439   BILIRUBINUR NEGATIVE 06/24/2018 0439   KETONESUR NEGATIVE 06/24/2018 0439   PROTEINUR 100 (A) 06/24/2018 0439   UROBILINOGEN 0.2 04/17/2011 1030   NITRITE NEGATIVE 06/24/2018 0439   LEUKOCYTESUR NEGATIVE 06/24/2018 0439    Radiological Exams on Admission: Ct Code Stroke Cta Head W/wo Contrast  Result Date: 11/19/2018 CLINICAL DATA:  58 year old male code stroke presentation. Abnormal speech.  EXAM: CT ANGIOGRAPHY HEAD AND NECK CT PERFUSION BRAIN TECHNIQUE: Multidetector CT imaging of the head and neck was performed using the standard protocol during bolus administration of intravenous contrast. Multiplanar CT image reconstructions and MIPs were obtained to evaluate the vascular anatomy. Carotid stenosis measurements (when applicable) are obtained utilizing NASCET criteria, using the distal internal carotid diameter as the denominator. Multiphase CT imaging of the brain was performed following IV bolus contrast injection. Subsequent parametric perfusion maps were calculated using RAPID software. CONTRAST:  155m OMNIPAQUE IOHEXOL 350 MG/ML SOLN COMPARISON:  Head CT without contrast 2113 hours today. CT head without contrast 10/15/2017. Report of brain MRI 04/13/2005. FINDINGS: CT Brain Perfusion Findings: ASPECTS: 10 at 2113 hours today. CBF (<30%) Volume: None Perfusion (Tmax>6.0s) volume: None hypoperfusion index not applicable Mismatch Volume: None Infarction Location:Not applicable CTA NECK Skeleton: Absent dentition. Mild for age cervical spine degeneration. No acute osseous abnormality identified. Upper chest: Upper lobe emphysema. Left chest cardiac pacemaker type device. Other neck: Negative.  No lymphadenopathy. Aortic arch: Mild to moderate soft and calcified arch atherosclerosis. Three vessel arch configuration. Right carotid system: Minimal calcified plaque at the right ICA origin with no stenosis. Left carotid system: Soft plaque in the anterior left  CCA at the level of the larynx without stenosis (series 5, image 135). Minimal plaque at the left carotid bifurcation without stenosis. Vertebral arteries: Mild if any plaque in the proximal right subclavian artery. Normal right vertebral artery origin. The right vertebral is non dominant and diminutive but patent to the skull base without stenosis. Mild calcified plaque in the proximal left subclavian artery without stenosis. Normal left vertebral artery origin. Dominant left vertebral artery is patent to the skull base. Portion of the proximal vertebral is obscured by venous contrast but no left vertebral stenosis is identified. CTA HEAD Posterior circulation: Dominant distal left vertebral artery. Normal PICA origins and vertebrobasilar junction. Patent basilar artery without stenosis. Normal SCA and right PCA origin. Fetal type left PCA origin. Right posterior communicating artery is present. Bilateral PCA branches are within normal limits. Anterior circulation: Both ICA siphons are patent. On the left there is minimal calcified plaque with no stenosis. Normal left ophthalmic and posterior communicating artery origins. On the right there is no plaque or stenosis. Normal right ophthalmic and posterior communicating artery origins. Normal MCA and ACA origins. The right A1 is dominant, the left is diminutive. Anterior communicating artery and visible ACA branches are within normal limits. Both MCA M1 segments and bifurcations are patent without stenosis. No left or right MCA branch occlusion is identified. Asymmetric branching pattern noted, but no asymmetric paucity of branches identified. Venous sinuses: Early contrast timing, venous sinuses not well evaluated. Anatomic variants: Dominant left and diminutive right vertebral arteries. Fetal type left PCA origin. Dominant right and diminutive left ACA A1 segments. Review of the MIP images confirms the above findings IMPRESSION: 1. Negative for large vessel  occlusion, and CTP detects no core infarct or ischemic penumbra. This was discussed by telephone with Dr. EKerney Elbeon 11/19/2018 at 2150 hours. 2. Aortic Atherosclerosis (ICD10-I70.0), but mild atherosclerosis in the head and neck with no arterial stenosis identified. 3.  Emphysema (ICD10-J43.9). Electronically Signed   By: HGenevie AnnM.D.   On: 11/19/2018 21:53   Ct Code Stroke Cta Neck W/wo Contrast  Result Date: 11/19/2018 CLINICAL DATA:  58year old male code stroke presentation. Abnormal speech. EXAM: CT ANGIOGRAPHY HEAD AND NECK CT PERFUSION BRAIN TECHNIQUE: Multidetector CT imaging of the head and neck was  performed using the standard protocol during bolus administration of intravenous contrast. Multiplanar CT image reconstructions and MIPs were obtained to evaluate the vascular anatomy. Carotid stenosis measurements (when applicable) are obtained utilizing NASCET criteria, using the distal internal carotid diameter as the denominator. Multiphase CT imaging of the brain was performed following IV bolus contrast injection. Subsequent parametric perfusion maps were calculated using RAPID software. CONTRAST:  154m OMNIPAQUE IOHEXOL 350 MG/ML SOLN COMPARISON:  Head CT without contrast 2113 hours today. CT head without contrast 10/15/2017. Report of brain MRI 04/13/2005. FINDINGS: CT Brain Perfusion Findings: ASPECTS: 10 at 2113 hours today. CBF (<30%) Volume: None Perfusion (Tmax>6.0s) volume: None hypoperfusion index not applicable Mismatch Volume: None Infarction Location:Not applicable CTA NECK Skeleton: Absent dentition. Mild for age cervical spine degeneration. No acute osseous abnormality identified. Upper chest: Upper lobe emphysema. Left chest cardiac pacemaker type device. Other neck: Negative.  No lymphadenopathy. Aortic arch: Mild to moderate soft and calcified arch atherosclerosis. Three vessel arch configuration. Right carotid system: Minimal calcified plaque at the right ICA origin with no  stenosis. Left carotid system: Soft plaque in the anterior left CCA at the level of the larynx without stenosis (series 5, image 135). Minimal plaque at the left carotid bifurcation without stenosis. Vertebral arteries: Mild if any plaque in the proximal right subclavian artery. Normal right vertebral artery origin. The right vertebral is non dominant and diminutive but patent to the skull base without stenosis. Mild calcified plaque in the proximal left subclavian artery without stenosis. Normal left vertebral artery origin. Dominant left vertebral artery is patent to the skull base. Portion of the proximal vertebral is obscured by venous contrast but no left vertebral stenosis is identified. CTA HEAD Posterior circulation: Dominant distal left vertebral artery. Normal PICA origins and vertebrobasilar junction. Patent basilar artery without stenosis. Normal SCA and right PCA origin. Fetal type left PCA origin. Right posterior communicating artery is present. Bilateral PCA branches are within normal limits. Anterior circulation: Both ICA siphons are patent. On the left there is minimal calcified plaque with no stenosis. Normal left ophthalmic and posterior communicating artery origins. On the right there is no plaque or stenosis. Normal right ophthalmic and posterior communicating artery origins. Normal MCA and ACA origins. The right A1 is dominant, the left is diminutive. Anterior communicating artery and visible ACA branches are within normal limits. Both MCA M1 segments and bifurcations are patent without stenosis. No left or right MCA branch occlusion is identified. Asymmetric branching pattern noted, but no asymmetric paucity of branches identified. Venous sinuses: Early contrast timing, venous sinuses not well evaluated. Anatomic variants: Dominant left and diminutive right vertebral arteries. Fetal type left PCA origin. Dominant right and diminutive left ACA A1 segments. Review of the MIP images confirms the  above findings IMPRESSION: 1. Negative for large vessel occlusion, and CTP detects no core infarct or ischemic penumbra. This was discussed by telephone with Dr. EKerney Elbeon 11/19/2018 at 2150 hours. 2. Aortic Atherosclerosis (ICD10-I70.0), but mild atherosclerosis in the head and neck with no arterial stenosis identified. 3.  Emphysema (ICD10-J43.9). Electronically Signed   By: HGenevie AnnM.D.   On: 11/19/2018 21:53   Ct Code Stroke Cta Cerebral Perfusion W/wo Contrast  Result Date: 11/19/2018 CLINICAL DATA:  58year old male code stroke presentation. Abnormal speech. EXAM: CT ANGIOGRAPHY HEAD AND NECK CT PERFUSION BRAIN TECHNIQUE: Multidetector CT imaging of the head and neck was performed using the standard protocol during bolus administration of intravenous contrast. Multiplanar CT image reconstructions and MIPs were  obtained to evaluate the vascular anatomy. Carotid stenosis measurements (when applicable) are obtained utilizing NASCET criteria, using the distal internal carotid diameter as the denominator. Multiphase CT imaging of the brain was performed following IV bolus contrast injection. Subsequent parametric perfusion maps were calculated using RAPID software. CONTRAST:  168m OMNIPAQUE IOHEXOL 350 MG/ML SOLN COMPARISON:  Head CT without contrast 2113 hours today. CT head without contrast 10/15/2017. Report of brain MRI 04/13/2005. FINDINGS: CT Brain Perfusion Findings: ASPECTS: 10 at 2113 hours today. CBF (<30%) Volume: None Perfusion (Tmax>6.0s) volume: None hypoperfusion index not applicable Mismatch Volume: None Infarction Location:Not applicable CTA NECK Skeleton: Absent dentition. Mild for age cervical spine degeneration. No acute osseous abnormality identified. Upper chest: Upper lobe emphysema. Left chest cardiac pacemaker type device. Other neck: Negative.  No lymphadenopathy. Aortic arch: Mild to moderate soft and calcified arch atherosclerosis. Three vessel arch configuration. Right carotid  system: Minimal calcified plaque at the right ICA origin with no stenosis. Left carotid system: Soft plaque in the anterior left CCA at the level of the larynx without stenosis (series 5, image 135). Minimal plaque at the left carotid bifurcation without stenosis. Vertebral arteries: Mild if any plaque in the proximal right subclavian artery. Normal right vertebral artery origin. The right vertebral is non dominant and diminutive but patent to the skull base without stenosis. Mild calcified plaque in the proximal left subclavian artery without stenosis. Normal left vertebral artery origin. Dominant left vertebral artery is patent to the skull base. Portion of the proximal vertebral is obscured by venous contrast but no left vertebral stenosis is identified. CTA HEAD Posterior circulation: Dominant distal left vertebral artery. Normal PICA origins and vertebrobasilar junction. Patent basilar artery without stenosis. Normal SCA and right PCA origin. Fetal type left PCA origin. Right posterior communicating artery is present. Bilateral PCA branches are within normal limits. Anterior circulation: Both ICA siphons are patent. On the left there is minimal calcified plaque with no stenosis. Normal left ophthalmic and posterior communicating artery origins. On the right there is no plaque or stenosis. Normal right ophthalmic and posterior communicating artery origins. Normal MCA and ACA origins. The right A1 is dominant, the left is diminutive. Anterior communicating artery and visible ACA branches are within normal limits. Both MCA M1 segments and bifurcations are patent without stenosis. No left or right MCA branch occlusion is identified. Asymmetric branching pattern noted, but no asymmetric paucity of branches identified. Venous sinuses: Early contrast timing, venous sinuses not well evaluated. Anatomic variants: Dominant left and diminutive right vertebral arteries. Fetal type left PCA origin. Dominant right and  diminutive left ACA A1 segments. Review of the MIP images confirms the above findings IMPRESSION: 1. Negative for large vessel occlusion, and CTP detects no core infarct or ischemic penumbra. This was discussed by telephone with Dr. EKerney Elbeon 11/19/2018 at 2150 hours. 2. Aortic Atherosclerosis (ICD10-I70.0), but mild atherosclerosis in the head and neck with no arterial stenosis identified. 3.  Emphysema (ICD10-J43.9). Electronically Signed   By: HGenevie AnnM.D.   On: 11/19/2018 21:53   Ct Head Code Stroke Wo Contrast  Result Date: 11/19/2018 CLINICAL DATA:  Code stroke. Altered level of consciousness. Abnormal speech. EXAM: CT HEAD WITHOUT CONTRAST TECHNIQUE: Contiguous axial images were obtained from the base of the skull through the vertex without intravenous contrast. COMPARISON:  10/15/2017 FINDINGS: Brain: Enlargement of the ventricles and sulci as well as prominent extra-axial CSF spaces over both cerebral convexities and along the interhemispheric fissure in the frontal region are unchanged  and compatible with age advanced cerebral atrophy. Coexistent small subdural hygromas are not excluded. No acute infarct, acute intracranial hemorrhage, mass, or midline shift is identified. Periventricular white matter hypodensities are similar to the prior study and nonspecific but compatible with mild chronic small vessel ischemic disease. Vascular: No hyperdense vessel. Skull: No fracture or focal osseous lesion. Sinuses/Orbits: Paranasal sinuses and mastoid air cells are clear. Unremarkable orbits. Other: None. ASPECTS [facility] Stroke Program Early CT Score) - Ganglionic level infarction (caudate, lentiform nuclei, internal capsule, insula, M1-M3 cortex): 7 - Supraganglionic infarction (M4-M6 cortex): 3 Total score (0-10 with 10 being normal): 10 IMPRESSION: 1. No evidence of acute intracranial abnormality. 2. ASPECTS is 10. 3. Age advanced cerebral atrophy and mild chronic small vessel ischemic disease. These  results were communicated to Dr. Cheral Marker at 9:22 pm on 11/19/2018 by text page via the [facility] messaging system. Electronically Signed   By: Logan Bores M.D.   On: 11/19/2018 21:22    EKG: Independently reviewed.  Sinus tachycardia, LBBB.  LBBB seen on prior EKG, tachycardia is new.  Assessment/Plan Principal Problem:   Encephalopathy Active Problems:   CKD (chronic kidney disease), stage III ([name])   Schizophrenia ([name])   Paroxysmal atrial fibrillation ([name])   Essential hypertension  Peter Marsh is a 58 y.o. male with medical history significant for conduction system disease s/p PPM not on anticoagulation, CKD stage III, hypertension, hyperlipidemia, LBBB, GERD, esophageal ulcer, asthma, and schizophrenia who is admitted with acute encephalopathy, agitation, speech abnormality.  Acute encephalopathy: Patient presenting with reported altered mental status, agitation, and speech abnormality without dysarthria.  At the time of my evaluation patient is alert and oriented without focal neurological deficits and appears near his baseline.  He has some tangential speech but is otherwise coherent.  Symptoms may also have been related to significant hypertension.  Chest x-ray without any sign of pneumonia.  Urinalysis and UDS are pending. -CTA head/neck with CTP negative for large vessel occlusion or perfusion deficit -Symptomatically improved with BP control and IV Geodon given in ED -Will resume home BP and mood stabilizing meds -Admit to telemetry, frequent neurochecks -Follow-up urinalysis and UDS -EEG ordered  Hypertension: Significantly hypertensive on ED arrival.  Improved with IV labetalol.  Patient is also improved with BP control. -Resume home clonidine 0.1 mg twice daily, hydralazine 50 mg 3 times daily, Lopressor 100 mg twice daily  History of esophageal ulcer/upper GI bleed: Last EGD 09/18/2016 by Dr. Therisa Doyne showed esophageal ulcer without active bleeding. -Continue Protonix and  Carafate  Hypokalemia: Mild, supplementing.  CKD stage III: GFR slightly decreased from baseline.  Continue IV fluids post contrast study and recheck in a.m.  Conduction system disease status post PPM: EP study in October 2018 suggestive of severe conduction system disease without evidence of inducible atrial flutter or atrial fibrillation.  At that time it was determined that systemic anticoagulation risk is greater than benefit due to no inducible A. fib/flutter.  Asthma: Clear lung fields, saturating well on room air.  Continue Singulair, Dulera, and as needed albuterol.  Depression: Continue bupropion.  Schizophrenia: Concern for acute psychosis on ED arrival.  Mentation mood stabilized by the time of admission evaluation.  Patient denies any current hallucinations although does have some tangential speech. -Continue paliperidone -Consider psychiatry consultation   DVT prophylaxis: Subcutaneous Lovenox Code Status: Full code, confirmed with patient Family Communication: None present on admission Disposition Plan: Pending clinical progress Consults called: Neurology Admission status: Observation   Zada Finders MD [facility]  If 7PM-7AM, please contact night-coverage [email or web address]  11/19/2018, 11:51 PM

## 2018-11-19 NOTE — ED Triage Notes (Signed)
Pt bib ems from home after wife called out for sudden onset agitation and difficulty getting his words out. LKW 1930 today. Pt not answering questions and not making sense on arrival. 203/130, HR 108 LBBB, 95% RA. 18G LAC.
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
## 2018-11-19 NOTE — Consult Note (Addendum)
Referring Physician: Dr. Tomi Bamberger    Chief Complaint: Agitation with AMS and nonsensical speech  HPI: Peter Marsh is an 58 y.o. male with a history of atrial fibrillation, not on a blood thinner or ASA per Epic, who presents with acute onset of confusion and garbled speech. His PMHx also includes CKD, HTN, pacemaker, HLD and schizophrenia. He was at home, became agitated per wife and then speech acutely became garbled. No lateralized weakness noted by EMS.   LKN was 7:30 PM. BP 200/116 in the field. O2 saturation 97% on RA. Covid screen was negative.   STAT head CT in the ED reveals diffuse cortical atrophy with no hemorrhage or other acute abnormality noted.   After CT, BP remained elevated at 206/111.   eGFR based on Cr is 35.   LSN: 7:30 PM tPA Given: No: No evidence for stroke on CT perfusion and several other differential diagnostic possibilities for his presentation.   Past Medical History:  Diagnosis Date  . Asthma    uses inhalers   . Bilateral carotid bruits   . Cardiac conduction disorder 2018   s/p MDT PPM  . Chronic kidney disease    bladder interstial cystitis   . Chronic kidney disease (CKD), stage IV (severe) ([name])    follow[facility] by Dr. Joelyn [facility] at [facility]  . Depression   . GERD (gastroesophageal reflux disease)   . History of stomach ulcers 2001  . Hypertension   . LBBB (left bundle branch block)   . Lower extremity edema   . Mild intermittent asthma without complication   . Mixed hyperlipidemia   . Paroxysmal atrial flutter ([place])   . PONV (postoperative nausea and vomiting)   . Schizophrenia [facility])     Past Surgical History:  Procedure Laterality Date  . APPENDECTOMY    . ELECTROPHYSIOLOGY STUDY N/A 02/26/2017   Procedure: ELECTROPHYSIOLOGY STUDY;  Surgeon: Evans Lance, MD;  Location: [place] [facility];  Service: Cardiovascular;  Laterality: N/A;  . ESOPHAGOGASTRODUODENOSCOPY (EGD) WITH PROPOFOL N/A 09/18/2016   Procedure:  ESOPHAGOGASTRODUODENOSCOPY (EGD) WITH PROPOFOL;  Surgeon: Ronnette Juniper, MD;  Location: [place];  Service: Gastroenterology;  Laterality: N/A;  . LUMBAR LAMINECTOMY/DECOMPRESSION MICRODISCECTOMY  04/19/2011   Procedure: LUMBAR LAMINECTOMY/DECOMPRESSION MICRODISCECTOMY;  Surgeon: Tobi Bastos;  Location: [facility];  Service: Orthopedics;  Laterality: Left;  Hemi LAminectomy/Microdiscectomy Lumbar four  - Lumbar five  on the Left (X-Ray)  . PACEMAKER IMPLANT N/A 02/26/2017   Procedure: PACEMAKER IMPLANT;  Surgeon: Evans Lance, MD;  Location: [place] [facility];  Service: Cardiovascular;  Laterality: N/A;  . TONSILLECTOMY      Family History  Problem Relation Age of Onset  . High blood pressure Mother   . Alzheimer's disease Father    Social History:  reports that he quit smoking about 10 years ago. He has a 30.00 pack-year smoking history. He has never used smokeless tobacco. He reports that he does not drink alcohol or use drugs.  Allergies:  Allergies  Allergen Reactions  . Methylpyrrolidone Hives    froze the intestine  . Niacin Other (See Comments) and Nausea And Vomiting    Flushing, itching, tingling   . Norvasc [Amlodipine Besylate]     Swollen Feet  . Oxybutynin Chloride [Oxybutynin Chloride Er] Other (See Comments)    froze the intestine  . Vesicare [Solifenacin Succinate] Other (See Comments)    Froze the intestine   . Ciprofloxacin Other (See Comments) and Rash    Felt flushed Unknown   .  Oxybutynin Rash    unknown  . Solifenacin Rash    unknown    Home Medications:   ROS: Unable to obtain due to AMS.   Physical Examination: Height 6' (1.829 m), weight 103.6 kg.  HEENT: [place]/AT Lungs: Respirations unlabored Ext: Mild pretibial pitting edema  Neurologic Examination: Mental Status: Awake and agitated with frequent perseverative fluent exclamations. Does not answer Statements bear no relationship to current situation or to questions. Does not follow  commands except when asked to stick out tongue. Will not attend to examiner or make eye contact. Does not fixate visually on visual stimuli but will close eyes to penlight. No hemineglect.  Cranial Nerves: II:  PERRL. Inconsistently blinks to threat.  III,IV, VI: Eyes conjugately at midline. No nystagmus.  V,VII: No facial droop. Will react to brow ridge pressure bilaterally  VIII: Unable to formally assess hearing IX,X: Not following commands for assessment of palate XI: Head at midline XII: midline tongue extension  Motor/Sensory: Does not follow commands for limb movement.  Moves all 4 extremities equally to noxious stimuli with brisk movements.  Deep Tendon Reflexes:  1+ bilateral brachioradialis, biceps and patellae.  Plantars: Right: downgoing   Left: downgoing Cerebellar/Gait: Unable to test.    Assessment: 58 y.o. male presenting with agitation, AMS and abnormal speech 1. Exam without lateralized motor or sensory deficit and no neglect or facial droop. Speech is fluent but bears no relationship to external environment. Articulation is normal with no dysarthria. Overall exam findings most consistent with acute psychosis. An atypical presentation of a receptive aphasia is also possible.  2. Stroke Risk Factors - HTN, HLD, atrial fibrillation 3. Not on a blood thinner or antiplatelet agent despite history of atrial fibrillation 4. Severe HTN. Acute hypertensive encephalopathy is on the DDx.  5. Schizophrenia.   Recommendations: 1. STAT CTA of head/neck with CTP to assess for possible left MCA occlusion and/or left temporal lobe hypoperfusion was negative for LVO or perfusion deficit. Benefits of diagnostic information obtained to rule in or rule out acute stroke outweigh overall risks despite low eGFR of 35.  2. Bolus IVF after CTA and start on continuous IVF for renal protection. Monitor telemetry, pulse ox, respirations and lung sounds.  3. Acute BP management given that CTP/CTA  shows no stroke. Goal would be to lower SBP to less than 160.  4. Most likely should be started on ASA and PCP consulted to determine if he is able to be anticoagulated.  5. Frequent neuro checks.  6. EEG.  7. Toxic/metabolic work up.  8. Pacemaker is MRI compatible. Will also obtain MRI brain.    '@Electronically'  signed: Dr. Kerney Elbe  11/19/2018, 9:10 PM

## 2018-11-19 NOTE — ED Notes (Signed)
Please call pt's wife to give her an update. [phone].

## 2018-11-20 ENCOUNTER — Other Ambulatory Visit: Payer: Self-pay

## 2018-11-20 ENCOUNTER — Observation Stay (HOSPITAL_COMMUNITY): Payer: PPO

## 2018-11-20 ENCOUNTER — Encounter (HOSPITAL_COMMUNITY): Payer: Self-pay

## 2018-11-20 DIAGNOSIS — Z950 Presence of cardiac pacemaker: Secondary | ICD-10-CM | POA: Diagnosis not present

## 2018-11-20 DIAGNOSIS — Z79899 Other long term (current) drug therapy: Secondary | ICD-10-CM | POA: Diagnosis not present

## 2018-11-20 DIAGNOSIS — Z6830 Body mass index (BMI) 30.0-30.9, adult: Secondary | ICD-10-CM | POA: Diagnosis not present

## 2018-11-20 DIAGNOSIS — F329 Major depressive disorder, single episode, unspecified: Secondary | ICD-10-CM | POA: Diagnosis present

## 2018-11-20 DIAGNOSIS — E669 Obesity, unspecified: Secondary | ICD-10-CM | POA: Diagnosis present

## 2018-11-20 DIAGNOSIS — R4701 Aphasia: Secondary | ICD-10-CM | POA: Diagnosis not present

## 2018-11-20 DIAGNOSIS — Z7951 Long term (current) use of inhaled steroids: Secondary | ICD-10-CM | POA: Diagnosis not present

## 2018-11-20 DIAGNOSIS — R4182 Altered mental status, unspecified: Secondary | ICD-10-CM | POA: Diagnosis present

## 2018-11-20 DIAGNOSIS — I129 Hypertensive chronic kidney disease with stage 1 through stage 4 chronic kidney disease, or unspecified chronic kidney disease: Secondary | ICD-10-CM | POA: Diagnosis present

## 2018-11-20 DIAGNOSIS — Z1159 Encounter for screening for other viral diseases: Secondary | ICD-10-CM | POA: Diagnosis not present

## 2018-11-20 DIAGNOSIS — Z87891 Personal history of nicotine dependence: Secondary | ICD-10-CM | POA: Diagnosis not present

## 2018-11-20 DIAGNOSIS — I674 Hypertensive encephalopathy: Secondary | ICD-10-CM | POA: Diagnosis present

## 2018-11-20 DIAGNOSIS — J4520 Mild intermittent asthma, uncomplicated: Secondary | ICD-10-CM | POA: Diagnosis present

## 2018-11-20 DIAGNOSIS — F209 Schizophrenia, unspecified: Secondary | ICD-10-CM | POA: Diagnosis present

## 2018-11-20 DIAGNOSIS — E876 Hypokalemia: Secondary | ICD-10-CM | POA: Diagnosis present

## 2018-11-20 DIAGNOSIS — I161 Hypertensive emergency: Secondary | ICD-10-CM | POA: Diagnosis present

## 2018-11-20 DIAGNOSIS — G9340 Encephalopathy, unspecified: Secondary | ICD-10-CM | POA: Diagnosis not present

## 2018-11-20 DIAGNOSIS — R479 Unspecified speech disturbances: Secondary | ICD-10-CM | POA: Diagnosis not present

## 2018-11-20 DIAGNOSIS — E782 Mixed hyperlipidemia: Secondary | ICD-10-CM | POA: Diagnosis present

## 2018-11-20 DIAGNOSIS — K219 Gastro-esophageal reflux disease without esophagitis: Secondary | ICD-10-CM | POA: Diagnosis present

## 2018-11-20 DIAGNOSIS — I480 Paroxysmal atrial fibrillation: Secondary | ICD-10-CM | POA: Diagnosis present

## 2018-11-20 LAB — RAPID URINE DRUG SCREEN, HOSP PERFORMED
Amphetamines: NOT DETECTED
Barbiturates: NOT DETECTED
Benzodiazepines: NOT DETECTED
Cocaine: NOT DETECTED
Opiates: NOT DETECTED
Tetrahydrocannabinol: POSITIVE — AB

## 2018-11-20 LAB — URINALYSIS, ROUTINE W REFLEX MICROSCOPIC
Bacteria, UA: NONE SEEN
Bilirubin Urine: NEGATIVE
Glucose, UA: NEGATIVE mg/dL
Hgb urine dipstick: NEGATIVE
Ketones, ur: NEGATIVE mg/dL
Leukocytes,Ua: NEGATIVE
Nitrite: NEGATIVE
Protein, ur: 100 mg/dL — AB
Specific Gravity, Urine: >1.046 — ABNORMAL HIGH (ref 1.005–1.030)
pH: 5 (ref 5.0–8.0)

## 2018-11-20 LAB — BASIC METABOLIC PANEL
Anion gap: 10 (ref 5–15)
BUN: 14 mg/dL (ref 6–20)
CO2: 21 mmol/L — ABNORMAL LOW (ref 22–32)
Calcium: 8.8 mg/dL — ABNORMAL LOW (ref 8.9–10.3)
Chloride: 111 mmol/L (ref 98–111)
Creatinine, Ser: 1.85 mg/dL — ABNORMAL HIGH (ref 0.61–1.24)
GFR calc Af Amer: 45 mL/min — ABNORMAL LOW (ref >60)
GFR calc non Af Amer: 39 mL/min — ABNORMAL LOW (ref >60)
Glucose, Bld: 110 mg/dL — ABNORMAL HIGH (ref 70–99)
Potassium: 3.7 mmol/L (ref 3.5–5.1)
Sodium: 142 mmol/L (ref 135–145)

## 2018-11-20 LAB — CBC
HCT: 42.4 % (ref 39.0–52.0)
Hemoglobin: 13 g/dL (ref 13.0–17.0)
MCH: 27.5 pg (ref 26.0–34.0)
MCHC: 30.7 g/dL (ref 30.0–36.0)
MCV: 89.6 fL (ref 80.0–100.0)
Platelets: 201 10*3/uL (ref 150–400)
RBC: 4.73 MIL/uL (ref 4.22–5.81)
RDW: 12.8 % (ref 11.5–15.5)
WBC: 6.6 10*3/uL (ref 4.0–10.5)
nRBC: 0 % (ref 0.0–0.2)

## 2018-11-20 LAB — LIPID PANEL
Cholesterol: 169 mg/dL (ref 0–200)
HDL: 25 mg/dL — ABNORMAL LOW (ref >40)
LDL Cholesterol: 125 mg/dL — ABNORMAL HIGH (ref 0–99)
Total CHOL/HDL Ratio: 6.8 RATIO
Triglycerides: 96 mg/dL (ref <150)
VLDL: 19 mg/dL (ref 0–40)

## 2018-11-20 LAB — HEMOGLOBIN A1C
Hgb A1c MFr Bld: 5.7 % — ABNORMAL HIGH (ref 4.8–5.6)
Mean Plasma Glucose: 116.89 mg/dL

## 2018-11-20 LAB — SARS CORONAVIRUS 2 BY RT PCR (HOSPITAL ORDER, PERFORMED IN ~~LOC~~ HOSPITAL LAB): SARS Coronavirus 2: NEGATIVE

## 2018-11-20 MED ORDER — ENOXAPARIN SODIUM 40 MG/0.4ML ~~LOC~~ SOLN
40.0000 mg | SUBCUTANEOUS | Status: DC
  Administered 2018-11-20 – 2018-11-21 (×2): 40 mg via SUBCUTANEOUS
  Filled 2018-11-20 (×3): qty 0.4

## 2018-11-20 MED ORDER — METOPROLOL TARTRATE 25 MG PO TABS
100.0000 mg | ORAL_TABLET | Freq: Two times a day (BID) | ORAL | Status: DC
  Administered 2018-11-20 – 2018-11-21 (×4): 100 mg via ORAL
  Filled 2018-11-20 (×4): qty 4

## 2018-11-20 MED ORDER — ACETAMINOPHEN 160 MG/5ML PO SOLN: 650.0000 mg | ORAL | Status: DC | PRN

## 2018-11-20 MED ORDER — MOMETASONE FURO-FORMOTEROL FUM 100-5 MCG/ACT IN AERO
2.0000 | INHALATION_SPRAY | Freq: Two times a day (BID) | RESPIRATORY_TRACT | Status: DC
  Administered 2018-11-20 – 2018-11-21 (×3): 2 via RESPIRATORY_TRACT
  Filled 2018-11-20: qty 8.8

## 2018-11-20 MED ORDER — HYDRALAZINE HCL 50 MG PO TABS
50.0000 mg | ORAL_TABLET | Freq: Three times a day (TID) | ORAL | Status: DC
  Administered 2018-11-20 – 2018-11-21 (×4): 50 mg via ORAL
  Filled 2018-11-20 (×5): qty 1

## 2018-11-20 MED ORDER — PALIPERIDONE ER 6 MG PO TB24
6.0000 mg | ORAL_TABLET | Freq: Every day | ORAL | Status: DC
  Administered 2018-11-20 (×2): 6 mg via ORAL
  Filled 2018-11-20 (×3): qty 1

## 2018-11-20 MED ORDER — SENNOSIDES-DOCUSATE SODIUM 8.6-50 MG PO TABS
1.0000 | ORAL_TABLET | Freq: Every day | ORAL | Status: DC
  Administered 2018-11-20 (×2): 1 via ORAL
  Filled 2018-11-20 (×2): qty 1

## 2018-11-20 MED ORDER — STROKE: EARLY STAGES OF RECOVERY BOOK
Freq: Once | Status: AC
  Administered 2018-11-20: 1
  Filled 2018-11-20: qty 1

## 2018-11-20 MED ORDER — ALBUTEROL SULFATE (2.5 MG/3ML) 0.083% IN NEBU: 2.5000 mL | INHALATION_SOLUTION | Freq: Four times a day (QID) | RESPIRATORY_TRACT | Status: DC | PRN

## 2018-11-20 MED ORDER — ACETAMINOPHEN 325 MG PO TABS: 650.0000 mg | ORAL_TABLET | ORAL | Status: DC | PRN

## 2018-11-20 MED ORDER — ACETAMINOPHEN 650 MG RE SUPP: 650.0000 mg | RECTAL | Status: DC | PRN

## 2018-11-20 MED ORDER — ACETAMINOPHEN 325 MG PO TABS: 650.0000 mg | ORAL_TABLET | Freq: Four times a day (QID) | ORAL | Status: DC | PRN

## 2018-11-20 MED ORDER — PANTOPRAZOLE SODIUM 40 MG PO TBEC
40.0000 mg | DELAYED_RELEASE_TABLET | Freq: Two times a day (BID) | ORAL | Status: DC
  Administered 2018-11-20 – 2018-11-21 (×4): 40 mg via ORAL
  Filled 2018-11-20 (×4): qty 1

## 2018-11-20 MED ORDER — SUCRALFATE 1 G PO TABS
1.0000 g | ORAL_TABLET | Freq: Four times a day (QID) | ORAL | Status: DC
  Administered 2018-11-20 – 2018-11-21 (×5): 1 g via ORAL
  Filled 2018-11-20 (×6): qty 1

## 2018-11-20 MED ORDER — MONTELUKAST SODIUM 10 MG PO TABS
10.0000 mg | ORAL_TABLET | Freq: Every day | ORAL | Status: DC
  Administered 2018-11-20 (×2): 10 mg via ORAL
  Filled 2018-11-20 (×2): qty 1

## 2018-11-20 MED ORDER — CLONIDINE HCL 0.1 MG PO TABS
0.1000 mg | ORAL_TABLET | Freq: Two times a day (BID) | ORAL | Status: DC
  Administered 2018-11-20 – 2018-11-21 (×4): 0.1 mg via ORAL
  Filled 2018-11-20 (×4): qty 1

## 2018-11-20 MED ORDER — POTASSIUM CHLORIDE 20 MEQ/15ML (10%) PO SOLN
40.0000 meq | Freq: Once | ORAL | Status: AC
  Administered 2018-11-20: 40 meq via ORAL
  Filled 2018-11-20: qty 30

## 2018-11-20 MED ORDER — BUPROPION HCL ER (SR) 150 MG PO TB12
150.0000 mg | ORAL_TABLET | Freq: Two times a day (BID) | ORAL | Status: DC
  Administered 2018-11-20 – 2018-11-21 (×4): 150 mg via ORAL
  Filled 2018-11-20 (×4): qty 1

## 2018-11-20 MED ORDER — SODIUM CHLORIDE 0.9 % IV SOLN
1000.0000 mL | INTRAVENOUS | Status: AC
  Administered 2018-11-20: 1000 mL via INTRAVENOUS

## 2018-11-20 NOTE — Evaluation (Signed)
Occupational Therapy Evaluation Patient Details Name: Peter Marsh MRN: [id number] DOB: [birth year] Today's Date: 11/20/2018    History of Present Illness Patient is a 58 y/o male presenting to the ED on 11/19/2018 with AMS. Past medical history significant for conduction system disease s/p PPM not on anticoagulation, CKD stage III, hypertension, hyperlipidemia, LBBB, GERD, esophageal ulcer, asthma, and schizophrenia. CT head without contrast was negative for acute intracranial abnormality. Admitted for continued work-up.   Clinical Impression   Pt admitted with above and presents to OT with impairments affecting ability to complete ADL at [facility].  Pt completed bathing and dressing at sit > stand level from EOB with close supervision and min cues for safety when attempting to doff pants in standing.  Pt demonstrating increased difficulty with mobility, potentially exacerbated by tangential speech as pt easily internally distracted.  Pt will benefit from OT acutely to improve ability to complete ADLs at PLOF and decrease burden of care, will most likely not require f/u at d/c.    Follow Up Recommendations  Supervision/Assistance - 24 hour    Equipment Recommendations  Tub/shower bench       Precautions / Restrictions Precautions Precautions: Fall Restrictions Weight Bearing Restrictions: No      Mobility Bed Mobility Overal bed mobility: Modified Independent                Transfers Overall transfer level: Needs assistance Equipment used: None Transfers: Sit to/from Stand Sit to Stand: Min guard         General transfer comment: close min guard for safety and immediate standing balance    Balance Overall balance assessment: Mild deficits observed, not formally tested                                         ADL either performed or assessed with clinical judgement   ADL Overall ADL's : Needs assistance/impaired     Grooming: Set up;Wash/dry  face;Wash/dry hands;Sitting   Upper Body Bathing: Supervision/ safety;Set up;Sitting   Lower Body Bathing: Supervison/ safety;Set up;Sit to/from stand   Upper Body Dressing : Supervision/safety;Set up;Sitting   Lower Body Dressing: Minimal assistance;Sit to/from stand Lower Body Dressing Details (indicate cue type and reason): attempted doffing underwear in standing, requiring cues to sit for safety Toilet Transfer: Minimal assistance;+2 for safety/equipment           Functional mobility during ADLs: Minimal assistance;+2 for safety/equipment General ADL Comments: Pt able to complete sit <> stand, dynamic standing, and functional transfers without AD with min assist.  However pt reporting increased shakiness in legs with "freezing" episode when walking to room doorway.     Vision Baseline Vision/History: Wears glasses Wears Glasses: At all times Patient Visual Report: No change from baseline Vision Assessment?: No apparent visual deficits            Pertinent Vitals/Pain Pain Assessment: No/denies pain     Hand Dominance Right   Extremity/Trunk Assessment Upper Extremity Assessment Upper Extremity Assessment: Overall WFL for tasks assessed   Lower Extremity Assessment Lower Extremity Assessment: Overall WFL for tasks assessed   Cervical / Trunk Assessment Cervical / Trunk Assessment: Normal   Communication Communication Communication: No difficulties   Cognition Arousal/Alertness: Awake/alert Behavior During Therapy: WFL for tasks assessed/performed Overall Cognitive Status: No family/caregiver present to determine baseline cognitive functioning  General Comments: Pt able to recall 3/3 words with immediate recall.  Only able to recall 1/3 with cue after ~5 mins              Home Living Family/patient expects to be discharged to:: Private residence Living Arrangements: Spouse/significant other Available Help at  Discharge: Family;Available 24 hours/day Type of Home: Apartment Home Access: Level entry     Home Layout: One level     Bathroom Shower/Tub: Teacher, early years/pre: Standard     Home Equipment: Grab bars - tub/shower   Additional Comments: prescription glasses      Prior Functioning/Environment Level of Independence: Independent                 OT Problem List: Decreased strength;Decreased activity tolerance;Impaired balance (sitting and/or standing);Decreased knowledge of use of DME or AE      OT Treatment/Interventions: Self-care/ADL training;Therapeutic exercise;DME and/or AE instruction;Energy conservation;Therapeutic activities;Patient/family education;Balance training    OT Goals(Current goals can be found in the care plan section) Acute Rehab OT Goals Patient Stated Goal: get home to wife OT Goal Formulation: With patient Time For Goal Achievement: 12/04/18 Potential to Achieve Goals: Good  OT Frequency: Min 2X/week           Co-evaluation PT/OT/SLP Co-Evaluation/Treatment: Yes Reason for Co-Treatment: Necessary to address cognition/behavior during functional activity;For patient/therapist safety;To address functional/ADL transfers PT goals addressed during session: Mobility/safety with mobility;Balance;Strengthening/ROM OT goals addressed during session: ADL's and self-care;Strengthening/ROM      AM-PAC OT "6 Clicks" Daily Activity     Outcome Measure Help from another person eating meals?: None Help from another person taking care of personal grooming?: A Little Help from another person toileting, which includes using toliet, bedpan, or urinal?: A Little Help from another person bathing (including washing, rinsing, drying)?: A Little Help from another person to put on and taking off regular upper body clothing?: A Little Help from another person to put on and taking off regular lower body clothing?: A Little 6 Click Score: 19   End of  Session Equipment Utilized During Treatment: Gait belt Nurse Communication: Mobility status  Activity Tolerance: Patient tolerated treatment well Patient left: in chair;with chair alarm set;with call bell/phone within reach  OT Visit Diagnosis: Unsteadiness on feet (R26.81);Muscle weakness (generalized) (M62.81);Other abnormalities of gait and mobility (R26.89)                Time: 7741-4239 OT Time Calculation (min): 28 min Charges:  OT General Charges $OT Visit: 1 Visit OT Evaluation $OT Eval Moderate Complexity: [place], [place], [place] 11/20/2018, 2:06 PM

## 2018-11-20 NOTE — ED Notes (Signed)
Attempted to call report; advised that room/pt has not been approved by the charge nurse yet. Due to time room being assigned as ready (25 minutes), reminded the nurse to please call bed control to let them know whether or not bed is approved.

## 2018-11-20 NOTE — Progress Notes (Signed)
Pt admitted to 3W29. Walked to bed from stretcher without difficulty. A&O X 4, no c/o pain or discomfort. Welcomed and oriented to unit. Will continue to monitor.

## 2018-11-20 NOTE — Evaluation (Signed)
SLP Cancellation Note  Patient Details Name: Peter Marsh MRN: [id number] DOB: 1960-06-01   Cancelled treatment:       Reason Eval/Treat Not Completed: Other (comment)(2nd attempt to see pt, he is with PT at this time, will continue efforts)  Luanna Salk, [facility] SLP Acute Rehab Services Pager [phone] Office [phone]  Peter Marsh 11/20/2018, 10:45 AM

## 2018-11-20 NOTE — Progress Notes (Addendum)
Subjective: Patient reports that he is doing a lot better today. He reports that yesterday he had gone to a picnic in the morning, he was feeling fine, he had not taken his blood pressure medications that day. When he returned home he was sitting in a chair when he started having lower extremity shaking, started sweating and feeling hot, and reports that he was confused. He states that he did not have any tongue biting, LOC, or urinary incontinence.  He states that he doesn't often miss his medications.   Objective: Current vital signs: BP (!) 143/89 (BP Location: Right Arm)   Pulse (!) 59   Temp 98.3 F (36.8 C) (Axillary)   Resp 18   Ht 6\' 1"  (1.854 m)   Wt 103.7 kg   SpO2 94%   BMI 30.16 kg/m  Vital signs in last 24 hours: Temp:  [98.1 F (36.7 C)-98.3 F (36.8 C)] 98.3 F (36.8 C) (07/15 0738) Pulse Rate:  [58-126] 59 (07/15 0738) Resp:  [14-27] 18 (07/15 0738) BP: (106-206)/(72-115) 143/89 (07/15 0738) SpO2:  [93 %-98 %] 94 % (07/15 0738) Weight:  [103.6 kg-103.7 kg] 103.7 kg (07/15 0137)  Intake/Output from previous day: No intake/output data recorded. Intake/Output this shift: No intake/output data recorded. Nutritional status:  Diet Order            Diet regular Room service appropriate? Yes; Fluid consistency: Thin  Diet effective now              Neurologic Exam: General: NAD Mental Status: Alert, oriented x3, appropriate conversation, speech fluent, no evidence of aphasia, able to repeat sentences, following commands, able to add quarter, nickle, and dime, and how many quarters in 1 dollar and 2.75$ Cranial Nerves: II:  Visual fields intact, PERRLA  III,IV, VI: ptosis not present, extra-ocular motions intact bilaterally V,VII: smile symmetric, facial light touch sensation normal bilaterally VIII: hearing normal bilaterally IX,X: uvula rises symmetrically XI: bilateral shoulder shrug XII: midline tongue extension without atrophy or  fasciculations  Motor: Right : Upper extremity   5/5    Left:     Upper extremity   5/5  Lower extremity   5/5     Lower extremity   5/5 Tone and bulk:Mildly increased tone in bilateral upper extremities,  no atrophy noted Sensory: Sensation to light touch intact throughout, bilaterally Deep Tendon Reflexes:  Right: Upper Extremity   Left: Upper extremity   biceps (C-5 to C-6) 2/4   biceps (C-5 to C-6) 2/4 Brachioradialis (C6) 2/4  Brachioradialis (C6) 2/4  Lower Extremity Lower Extremity  quadriceps (L-2 to L-4) 2/4   quadriceps (L-2 to L-4) 2/4 Achilles (S1) 2/4   Achilles (S1) 2/4  Plantars: Right: upgoing   Left: upgoing Cerebellar: normal finger-to-nose,  normal heel-to-shin test Gait: Not tested   Lab Results: Results for orders placed or performed during the hospital encounter of 11/19/18 (from the past 48 hour(s))  Ethanol     Status: None   Collection Time: 11/19/18  9:07 PM  Result Value Ref Range   Alcohol, Ethyl (B) <10 <10 mg/dL    Comment: (NOTE) Lowest detectable limit for serum alcohol is 10 mg/dL. For medical purposes only. Performed at [facility], [place] [address], [place], [place] [address]   Protime-INR     Status: None   Collection Time: 11/19/18  9:07 PM  Result Value Ref Range   Prothrombin Time 14.4 11.4 - 15.2 seconds   INR 1.1 0.8 - 1.2  Comment: (NOTE) INR goal varies based on device and disease states. Performed at [facility], [place] [address], [place], [place] [address]   APTT     Status: None   Collection Time: 11/19/18  9:07 PM  Result Value Ref Range   aPTT 29 24 - 36 seconds    Comment: Performed at [facility] [address], [place] [address]  CBC     Status: None   Collection Time: 11/19/18  9:07 PM  Result Value Ref Range   WBC 8.6 4.0 - 10.5 K/uL   RBC 5.20 4.22 - 5.81 MIL/uL   Hemoglobin 14.5 13.0 - 17.0 g/dL   HCT 45.7 39.0 - 52.0 %   MCV 87.9 80.0 - 100.0 fL   MCH 27.9 26.0 - 34.0 pg    MCHC 31.7 30.0 - 36.0 g/dL   RDW 12.9 11.5 - 15.5 %   Platelets 234 150 - 400 K/uL   nRBC 0.0 0.0 - 0.2 %    Comment: Performed at [facility], [place] [address], [place], [place] [address]  Differential     Status: Abnormal   Collection Time: 11/19/18  9:07 PM  Result Value Ref Range   Neutrophils Relative % 57 %   Neutro Abs 4.9 1.7 - 7.7 K/uL   Lymphocytes Relative 28 %   Lymphs Abs 2.4 0.7 - 4.0 K/uL   Monocytes Relative 13 %   Monocytes Absolute 1.1 (H) 0.1 - 1.0 K/uL   Eosinophils Relative 2 %   Eosinophils Absolute 0.2 0.0 - 0.5 K/uL   Basophils Relative 0 %   Basophils Absolute 0.0 0.0 - 0.1 K/uL   Immature Granulocytes 0 %   Abs Immature Granulocytes 0.02 0.00 - 0.07 K/uL    Comment: Performed at [facility] [address] [place], [place] [address]  Comprehensive metabolic panel     Status: Abnormal   Collection Time: 11/19/18  9:07 PM  Result Value Ref Range   Sodium 141 135 - 145 mmol/L   Potassium 3.4 (L) 3.5 - 5.1 mmol/L   Chloride 108 98 - 111 mmol/L   CO2 21 (L) 22 - 32 mmol/L   Glucose, Bld 103 (H) 70 - 99 mg/dL   BUN 15 6 - 20 mg/dL   Creatinine, Ser 2.08 (H) 0.61 - 1.24 mg/dL   Calcium 9.7 8.9 - 10.3 mg/dL   Total Protein 7.2 6.5 - 8.1 g/dL   Albumin 4.1 3.5 - 5.0 g/dL   AST 22 15 - 41 U/L   ALT 17 0 - 44 U/L   Alkaline Phosphatase 59 38 - 126 U/L   Total Bilirubin 1.2 0.3 - 1.2 mg/dL   GFR calc non Af Amer 34 (L) >60 mL/min   GFR calc Af Amer 39 (L) >60 mL/min   Anion gap 12 5 - 15    Comment: Performed at [facility] [address] [place], [place] [address]  I-stat chem 8, ED     Status: Abnormal   Collection Time: 11/19/18  9:12 PM  Result Value Ref Range   Sodium 142 135 - 145 mmol/L   Potassium 3.4 (L) 3.5 - 5.1 mmol/L   Chloride 107 98 - 111 mmol/L   BUN 17 6 - 20 mg/dL   Creatinine, Ser 2.00 (H) 0.61 - 1.24 mg/dL   Glucose, Bld 102 (H) 70 - 99 mg/dL   Calcium, Ion 1.14 (L) 1.15 - 1.40 mmol/L  TCO2 24 22 - 32 mmol/L    Hemoglobin 15.0 13.0 - 17.0 g/dL   HCT 44.0 39.0 - 52.0 %  SARS Coronavirus 2 (CEPHEID - Performed in [facility] hospital lab), Hosp Order     Status: None   Collection Time: 11/20/18 12:01 AM   Specimen: Nasopharyngeal Swab  Result Value Ref Range   SARS Coronavirus 2 NEGATIVE NEGATIVE    Comment: (NOTE) If result is NEGATIVE SARS-CoV-2 target nucleic acids are NOT DETECTED. The SARS-CoV-2 RNA is generally detectable in upper and lower  respiratory specimens during the acute phase of infection. The lowest  concentration of SARS-CoV-2 viral copies this assay can detect is 250  copies / mL. A negative result does not preclude SARS-CoV-2 infection  and should not be used as the sole basis for treatment or other  patient management decisions.  A negative result may occur with  improper specimen collection / handling, submission of specimen other  than nasopharyngeal swab, presence of viral mutation(s) within the  areas targeted by this assay, and inadequate number of viral copies  (<250 copies / mL). A negative result must be combined with clinical  observations, patient history, and epidemiological information. If result is POSITIVE SARS-CoV-2 target nucleic acids are DETECTED. The SARS-CoV-2 RNA is generally detectable in upper and lower  respiratory specimens dur ing the acute phase of infection.  Positive  results are indicative of active infection with SARS-CoV-2.  Clinical  correlation with patient history and other diagnostic information is  necessary to determine patient infection status.  Positive results do  not rule out bacterial infection or co-infection with other viruses. If result is PRESUMPTIVE POSTIVE SARS-CoV-2 nucleic acids MAY BE PRESENT.   A presumptive positive result was obtained on the submitted specimen  and confirmed on repeat testing.  While 2019 novel coronavirus  (SARS-CoV-2) nucleic acids may be present in the submitted sample  additional confirmatory  testing may be necessary for epidemiological  and / or clinical management purposes  to differentiate between  SARS-CoV-2 and other Sarbecovirus currently known to infect humans.  If clinically indicated additional testing with an alternate test  methodology [id number]) is advised. The SARS-CoV-2 RNA is generally  detectable in upper and lower respiratory sp ecimens during the acute  phase of infection. The expected result is Negative. Fact Sheet for Patients:  StrictlyIdeas.no Fact Sheet for Healthcare Providers: BankingDealers.co.za This test is not yet approved or cleared by the [place] FDA and has been authorized for detection and/or diagnosis of SARS-CoV-2 by FDA under an Emergency Use Authorization (EUA).  This EUA will remain in effect (meaning this test can be used) for the duration of the COVID-19 declaration under Section 564(b)(1) of the Act, 21 U.S.C. section 360bbb-3(b)(1), unless the authorization is terminated or revoked sooner. Performed at [facility], [place] [address], [place], [place] [address]   Hemoglobin A1c     Status: Abnormal   Collection Time: 11/20/18  4:32 AM  Result Value Ref Range   Hgb A1c MFr Bld 5.7 (H) 4.8 - 5.6 %    Comment: (NOTE) Pre diabetes:          5.7%-6.4% Diabetes:              >6.4% Glycemic control for   <7.0% adults with diabetes    Mean Plasma Glucose 116.89 mg/dL    Comment: Performed at [facility] [address] [place], [place] [address]  Lipid panel     Status:  Abnormal   Collection Time: 11/20/18  4:32 AM  Result Value Ref Range   Cholesterol 169 0 - 200 mg/dL   Triglycerides 96 <150 mg/dL   HDL 25 (L) >40 mg/dL   Total CHOL/HDL Ratio 6.8 RATIO   VLDL 19 0 - 40 mg/dL   LDL Cholesterol 125 (H) 0 - 99 mg/dL    Comment:        Total Cholesterol/HDL:CHD Risk Coronary Heart Disease Risk Table                     Men   Women  1/2 Average Risk   3.4   3.3   Average Risk       5.0   4.4  2 X Average Risk   9.6   7.1  3 X Average Risk  23.4   11.0        Use the calculated Patient Ratio above and the CHD Risk Table to determine the patient's CHD Risk.        ATP III CLASSIFICATION (LDL):  <100     mg/dL   Optimal  100-129  mg/dL   Near or Above                    Optimal  130-159  mg/dL   Borderline  160-189  mg/dL   High  >190     mg/dL   Very High Performed at [facility] [address] [place], [place] [address]   CBC     Status: None   Collection Time: 11/20/18  4:32 AM  Result Value Ref Range   WBC 6.6 4.0 - 10.5 K/uL   RBC 4.73 4.22 - 5.81 MIL/uL   Hemoglobin 13.0 13.0 - 17.0 g/dL   HCT 42.4 39.0 - 52.0 %   MCV 89.6 80.0 - 100.0 fL   MCH 27.5 26.0 - 34.0 pg   MCHC 30.7 30.0 - 36.0 g/dL   RDW 12.8 11.5 - 15.5 %   Platelets 201 150 - 400 K/uL   nRBC 0.0 0.0 - 0.2 %    Comment: Performed at [facility], [place] [address] [place], [place] [address]  Basic metabolic panel     Status: Abnormal   Collection Time: 11/20/18  4:32 AM  Result Value Ref Range   Sodium 142 135 - 145 mmol/L   Potassium 3.7 3.5 - 5.1 mmol/L   Chloride 111 98 - 111 mmol/L   CO2 21 (L) 22 - 32 mmol/L   Glucose, Bld 110 (H) 70 - 99 mg/dL   BUN 14 6 - 20 mg/dL   Creatinine, Ser 1.85 (H) 0.61 - 1.24 mg/dL   Calcium 8.8 (L) 8.9 - 10.3 mg/dL   GFR calc non Af Amer 39 (L) >60 mL/min   GFR calc Af Amer 45 (L) >60 mL/min   Anion gap 10 5 - 15    Comment: Performed at [facility] [address], [place], [place] [address]    Recent Results (from the past 240 hour(s))  SARS Coronavirus 2 (CEPHEID - Performed in [facility] hospital lab), Hosp Order     Status: None   Collection Time: 11/20/18 12:01 AM   Specimen: Nasopharyngeal Swab  Result Value Ref Range Status   SARS Coronavirus 2 NEGATIVE NEGATIVE Final    Comment: (NOTE) If result is NEGATIVE SARS-CoV-2 target nucleic acids are NOT DETECTED. The SARS-CoV-2 RNA is generally  detectable in upper and  lower  respiratory specimens during the acute phase of infection. The lowest  concentration of SARS-CoV-2 viral copies this assay can detect is 250  copies / mL. A negative result does not preclude SARS-CoV-2 infection  and should not be used as the sole basis for treatment or other  patient management decisions.  A negative result may occur with  improper specimen collection / handling, submission of specimen other  than nasopharyngeal swab, presence of viral mutation(s) within the  areas targeted by this assay, and inadequate number of viral copies  (<250 copies / mL). A negative result must be combined with clinical  observations, patient history, and epidemiological information. If result is POSITIVE SARS-CoV-2 target nucleic acids are DETECTED. The SARS-CoV-2 RNA is generally detectable in upper and lower  respiratory specimens dur ing the acute phase of infection.  Positive  results are indicative of active infection with SARS-CoV-2.  Clinical  correlation with patient history and other diagnostic information is  necessary to determine patient infection status.  Positive results do  not rule out bacterial infection or co-infection with other viruses. If result is PRESUMPTIVE POSTIVE SARS-CoV-2 nucleic acids MAY BE PRESENT.   A presumptive positive result was obtained on the submitted specimen  and confirmed on repeat testing.  While 2019 novel coronavirus  (SARS-CoV-2) nucleic acids may be present in the submitted sample  additional confirmatory testing may be necessary for epidemiological  and / or clinical management purposes  to differentiate between  SARS-CoV-2 and other Sarbecovirus currently known to infect humans.  If clinically indicated additional testing with an alternate test  methodology [id number]) is advised. The SARS-CoV-2 RNA is generally  detectable in upper and lower respiratory sp ecimens during the acute  phase of infection. The  expected result is Negative. Fact Sheet for Patients:  StrictlyIdeas.no Fact Sheet for Healthcare Providers: BankingDealers.co.za This test is not yet approved or cleared by the [place] FDA and has been authorized for detection and/or diagnosis of SARS-CoV-2 by FDA under an Emergency Use Authorization (EUA).  This EUA will remain in effect (meaning this test can be used) for the duration of the COVID-19 declaration under Section 564(b)(1) of the Act, 21 U.S.C. section 360bbb-3(b)(1), unless the authorization is terminated or revoked sooner. Performed at [facility], [place] [address], [place], [place] [address]     Lipid Panel Recent Labs    11/20/18 0432  CHOL 169  TRIG 96  HDL 25*  CHOLHDL 6.8  VLDL 19  LDLCALC 125*    Studies/Results: Ct Code Stroke Cta Head W/wo Contrast  Result Date: 11/19/2018 CLINICAL DATA:  58 year old male code stroke presentation. Abnormal speech. EXAM: CT ANGIOGRAPHY HEAD AND NECK CT PERFUSION BRAIN TECHNIQUE: Multidetector CT imaging of the head and neck was performed using the standard protocol during bolus administration of intravenous contrast. Multiplanar CT image reconstructions and MIPs were obtained to evaluate the vascular anatomy. Carotid stenosis measurements (when applicable) are obtained utilizing NASCET criteria, using the distal internal carotid diameter as the denominator. Multiphase CT imaging of the brain was performed following IV bolus contrast injection. Subsequent parametric perfusion maps were calculated using RAPID software. CONTRAST:  185mL OMNIPAQUE IOHEXOL 350 MG/ML SOLN COMPARISON:  Head CT without contrast 2113 hours today. CT head without contrast 10/15/2017. Report of brain MRI 04/13/2005. FINDINGS: CT Brain Perfusion Findings: ASPECTS: 10 at 2113 hours today. CBF (<30%) Volume: None Perfusion (Tmax>6.0s) volume: None hypoperfusion index not applicable Mismatch Volume:  None Infarction Location:Not applicable CTA NECK Skeleton: Absent  dentition. Mild for age cervical spine degeneration. No acute osseous abnormality identified. Upper chest: Upper lobe emphysema. Left chest cardiac pacemaker type device. Other neck: Negative.  No lymphadenopathy. Aortic arch: Mild to moderate soft and calcified arch atherosclerosis. Three vessel arch configuration. Right carotid system: Minimal calcified plaque at the right ICA origin with no stenosis. Left carotid system: Soft plaque in the anterior left CCA at the level of the larynx without stenosis (series 5, image 135). Minimal plaque at the left carotid bifurcation without stenosis. Vertebral arteries: Mild if any plaque in the proximal right subclavian artery. Normal right vertebral artery origin. The right vertebral is non dominant and diminutive but patent to the skull base without stenosis. Mild calcified plaque in the proximal left subclavian artery without stenosis. Normal left vertebral artery origin. Dominant left vertebral artery is patent to the skull base. Portion of the proximal vertebral is obscured by venous contrast but no left vertebral stenosis is identified. CTA HEAD Posterior circulation: Dominant distal left vertebral artery. Normal PICA origins and vertebrobasilar junction. Patent basilar artery without stenosis. Normal SCA and right PCA origin. Fetal type left PCA origin. Right posterior communicating artery is present. Bilateral PCA branches are within normal limits. Anterior circulation: Both ICA siphons are patent. On the left there is minimal calcified plaque with no stenosis. Normal left ophthalmic and posterior communicating artery origins. On the right there is no plaque or stenosis. Normal right ophthalmic and posterior communicating artery origins. Normal MCA and ACA origins. The right A1 is dominant, the left is diminutive. Anterior communicating artery and visible ACA branches are within normal limits. Both MCA  M1 segments and bifurcations are patent without stenosis. No left or right MCA branch occlusion is identified. Asymmetric branching pattern noted, but no asymmetric paucity of branches identified. Venous sinuses: Early contrast timing, venous sinuses not well evaluated. Anatomic variants: Dominant left and diminutive right vertebral arteries. Fetal type left PCA origin. Dominant right and diminutive left ACA A1 segments. Review of the MIP images confirms the above findings IMPRESSION: 1. Negative for large vessel occlusion, and CTP detects no core infarct or ischemic penumbra. This was discussed by telephone with Dr. Kerney Elbe on 11/19/2018 at 2150 hours. 2. Aortic Atherosclerosis (ICD10-I70.0), but mild atherosclerosis in the head and neck with no arterial stenosis identified. 3.  Emphysema (ICD10-J43.9). Electronically Signed   By: Genevie Ann M.D.   On: 11/19/2018 21:53   Ct Code Stroke Cta Neck W/wo Contrast  Result Date: 11/19/2018 CLINICAL DATA:  58 year old male code stroke presentation. Abnormal speech. EXAM: CT ANGIOGRAPHY HEAD AND NECK CT PERFUSION BRAIN TECHNIQUE: Multidetector CT imaging of the head and neck was performed using the standard protocol during bolus administration of intravenous contrast. Multiplanar CT image reconstructions and MIPs were obtained to evaluate the vascular anatomy. Carotid stenosis measurements (when applicable) are obtained utilizing NASCET criteria, using the distal internal carotid diameter as the denominator. Multiphase CT imaging of the brain was performed following IV bolus contrast injection. Subsequent parametric perfusion maps were calculated using RAPID software. CONTRAST:  160mL OMNIPAQUE IOHEXOL 350 MG/ML SOLN COMPARISON:  Head CT without contrast 2113 hours today. CT head without contrast 10/15/2017. Report of brain MRI 04/13/2005. FINDINGS: CT Brain Perfusion Findings: ASPECTS: 10 at 2113 hours today. CBF (<30%) Volume: None Perfusion (Tmax>6.0s) volume: None  hypoperfusion index not applicable Mismatch Volume: None Infarction Location:Not applicable CTA NECK Skeleton: Absent dentition. Mild for age cervical spine degeneration. No acute osseous abnormality identified. Upper chest: Upper lobe emphysema. Left chest  cardiac pacemaker type device. Other neck: Negative.  No lymphadenopathy. Aortic arch: Mild to moderate soft and calcified arch atherosclerosis. Three vessel arch configuration. Right carotid system: Minimal calcified plaque at the right ICA origin with no stenosis. Left carotid system: Soft plaque in the anterior left CCA at the level of the larynx without stenosis (series 5, image 135). Minimal plaque at the left carotid bifurcation without stenosis. Vertebral arteries: Mild if any plaque in the proximal right subclavian artery. Normal right vertebral artery origin. The right vertebral is non dominant and diminutive but patent to the skull base without stenosis. Mild calcified plaque in the proximal left subclavian artery without stenosis. Normal left vertebral artery origin. Dominant left vertebral artery is patent to the skull base. Portion of the proximal vertebral is obscured by venous contrast but no left vertebral stenosis is identified. CTA HEAD Posterior circulation: Dominant distal left vertebral artery. Normal PICA origins and vertebrobasilar junction. Patent basilar artery without stenosis. Normal SCA and right PCA origin. Fetal type left PCA origin. Right posterior communicating artery is present. Bilateral PCA branches are within normal limits. Anterior circulation: Both ICA siphons are patent. On the left there is minimal calcified plaque with no stenosis. Normal left ophthalmic and posterior communicating artery origins. On the right there is no plaque or stenosis. Normal right ophthalmic and posterior communicating artery origins. Normal MCA and ACA origins. The right A1 is dominant, the left is diminutive. Anterior communicating artery and  visible ACA branches are within normal limits. Both MCA M1 segments and bifurcations are patent without stenosis. No left or right MCA branch occlusion is identified. Asymmetric branching pattern noted, but no asymmetric paucity of branches identified. Venous sinuses: Early contrast timing, venous sinuses not well evaluated. Anatomic variants: Dominant left and diminutive right vertebral arteries. Fetal type left PCA origin. Dominant right and diminutive left ACA A1 segments. Review of the MIP images confirms the above findings IMPRESSION: 1. Negative for large vessel occlusion, and CTP detects no core infarct or ischemic penumbra. This was discussed by telephone with Dr. Kerney Elbe on 11/19/2018 at 2150 hours. 2. Aortic Atherosclerosis (ICD10-I70.0), but mild atherosclerosis in the head and neck with no arterial stenosis identified. 3.  Emphysema (ICD10-J43.9). Electronically Signed   By: Genevie Ann M.D.   On: 11/19/2018 21:53   Ct Code Stroke Cta Cerebral Perfusion W/wo Contrast  Result Date: 11/19/2018 CLINICAL DATA:  58 year old male code stroke presentation. Abnormal speech. EXAM: CT ANGIOGRAPHY HEAD AND NECK CT PERFUSION BRAIN TECHNIQUE: Multidetector CT imaging of the head and neck was performed using the standard protocol during bolus administration of intravenous contrast. Multiplanar CT image reconstructions and MIPs were obtained to evaluate the vascular anatomy. Carotid stenosis measurements (when applicable) are obtained utilizing NASCET criteria, using the distal internal carotid diameter as the denominator. Multiphase CT imaging of the brain was performed following IV bolus contrast injection. Subsequent parametric perfusion maps were calculated using RAPID software. CONTRAST:  148mL OMNIPAQUE IOHEXOL 350 MG/ML SOLN COMPARISON:  Head CT without contrast 2113 hours today. CT head without contrast 10/15/2017. Report of brain MRI 04/13/2005. FINDINGS: CT Brain Perfusion Findings: ASPECTS: 10 at 2113  hours today. CBF (<30%) Volume: None Perfusion (Tmax>6.0s) volume: None hypoperfusion index not applicable Mismatch Volume: None Infarction Location:Not applicable CTA NECK Skeleton: Absent dentition. Mild for age cervical spine degeneration. No acute osseous abnormality identified. Upper chest: Upper lobe emphysema. Left chest cardiac pacemaker type device. Other neck: Negative.  No lymphadenopathy. Aortic arch: Mild to moderate soft and calcified  arch atherosclerosis. Three vessel arch configuration. Right carotid system: Minimal calcified plaque at the right ICA origin with no stenosis. Left carotid system: Soft plaque in the anterior left CCA at the level of the larynx without stenosis (series 5, image 135). Minimal plaque at the left carotid bifurcation without stenosis. Vertebral arteries: Mild if any plaque in the proximal right subclavian artery. Normal right vertebral artery origin. The right vertebral is non dominant and diminutive but patent to the skull base without stenosis. Mild calcified plaque in the proximal left subclavian artery without stenosis. Normal left vertebral artery origin. Dominant left vertebral artery is patent to the skull base. Portion of the proximal vertebral is obscured by venous contrast but no left vertebral stenosis is identified. CTA HEAD Posterior circulation: Dominant distal left vertebral artery. Normal PICA origins and vertebrobasilar junction. Patent basilar artery without stenosis. Normal SCA and right PCA origin. Fetal type left PCA origin. Right posterior communicating artery is present. Bilateral PCA branches are within normal limits. Anterior circulation: Both ICA siphons are patent. On the left there is minimal calcified plaque with no stenosis. Normal left ophthalmic and posterior communicating artery origins. On the right there is no plaque or stenosis. Normal right ophthalmic and posterior communicating artery origins. Normal MCA and ACA origins. The right A1 is  dominant, the left is diminutive. Anterior communicating artery and visible ACA branches are within normal limits. Both MCA M1 segments and bifurcations are patent without stenosis. No left or right MCA branch occlusion is identified. Asymmetric branching pattern noted, but no asymmetric paucity of branches identified. Venous sinuses: Early contrast timing, venous sinuses not well evaluated. Anatomic variants: Dominant left and diminutive right vertebral arteries. Fetal type left PCA origin. Dominant right and diminutive left ACA A1 segments. Review of the MIP images confirms the above findings IMPRESSION: 1. Negative for large vessel occlusion, and CTP detects no core infarct or ischemic penumbra. This was discussed by telephone with Dr. Kerney Elbe on 11/19/2018 at 2150 hours. 2. Aortic Atherosclerosis (ICD10-I70.0), but mild atherosclerosis in the head and neck with no arterial stenosis identified. 3.  Emphysema (ICD10-J43.9). Electronically Signed   By: Genevie Ann M.D.   On: 11/19/2018 21:53   Dg Chest Port 1 View  Result Date: 11/19/2018 CLINICAL DATA:  Sudden onset of agitation EXAM: PORTABLE CHEST 1 VIEW COMPARISON:  06/24/2018 FINDINGS: Left-sided pacing device as before. Mild cardiomegaly. No consolidation or effusion. Aortic atherosclerosis. No pneumothorax. IMPRESSION: No active disease.  Cardiomegaly Electronically Signed   By: Donavan Foil M.D.   On: 11/19/2018 23:59   Ct Head Code Stroke Wo Contrast  Result Date: 11/19/2018 CLINICAL DATA:  Code stroke. Altered level of consciousness. Abnormal speech. EXAM: CT HEAD WITHOUT CONTRAST TECHNIQUE: Contiguous axial images were obtained from the base of the skull through the vertex without intravenous contrast. COMPARISON:  10/15/2017 FINDINGS: Brain: Enlargement of the ventricles and sulci as well as prominent extra-axial CSF spaces over both cerebral convexities and along the interhemispheric fissure in the frontal region are unchanged and compatible  with age advanced cerebral atrophy. Coexistent small subdural hygromas are not excluded. No acute infarct, acute intracranial hemorrhage, mass, or midline shift is identified. Periventricular white matter hypodensities are similar to the prior study and nonspecific but compatible with mild chronic small vessel ischemic disease. Vascular: No hyperdense vessel. Skull: No fracture or focal osseous lesion. Sinuses/Orbits: Paranasal sinuses and mastoid air cells are clear. Unremarkable orbits. Other: None. ASPECTS [facility] Stroke Program Early CT Score) - Ganglionic level infarction (  caudate, lentiform nuclei, internal capsule, insula, M1-M3 cortex): 7 - Supraganglionic infarction (M4-M6 cortex): 3 Total score (0-10 with 10 being normal): 10 IMPRESSION: 1. No evidence of acute intracranial abnormality. 2. ASPECTS is 10. 3. Age advanced cerebral atrophy and mild chronic small vessel ischemic disease. These results were communicated to Dr. Cheral Marker at 9:22 pm on 11/19/2018 by text page via the [facility] messaging system. Electronically Signed   By: Logan Bores M.D.   On: 11/19/2018 21:22    Medications:  Prior to Admission:  Medications Prior to Admission  Medication Sig Dispense Refill Last Dose  . acetaminophen (TYLENOL) 325 MG tablet Take 650 mg by mouth every 6 (six) hours as needed for mild pain or headache.     . albuterol (PROVENTIL HFA;VENTOLIN HFA) 108 (90 BASE) MCG/ACT inhaler Inhale 2 puffs into the lungs every 6 (six) hours as needed for wheezing or shortness of breath.      . benazepril (LOTENSIN) 10 MG tablet Take 10 mg by mouth daily.     . budesonide-formoterol (SYMBICORT) 80-4.5 MCG/ACT inhaler Inhale 2 puffs into the lungs 2 (two) times daily. 1 Inhaler 11   . buPROPion (WELLBUTRIN SR) 150 MG 12 hr tablet Take 150 mg by mouth 2 (two) times daily.      . cloNIDine (CATAPRES) 0.1 MG tablet Take 1 tablet (0.1 mg total) by mouth 2 (two) times daily. 180 tablet 0   . hydrALAZINE (APRESOLINE) 50 MG  tablet Take 50 mg by mouth 3 (three) times daily.     . metoprolol tartrate (LOPRESSOR) 100 MG tablet TAKE 1 TABLET BY MOUTH TWICE DAILY (Patient taking differently: Take 100 mg by mouth 2 (two) times daily. ) 180 tablet 1   . montelukast (SINGULAIR) 10 MG tablet Take 10 mg by mouth at bedtime.     . paliperidone (INVEGA) 6 MG 24 hr tablet Take 6 mg by mouth at bedtime.     . pantoprazole (PROTONIX) 40 MG tablet Take 1 tablet (40 mg total) by mouth 2 (two) times daily. 60 tablet 0   . sucralfate (CARAFATE) 1 g tablet Take 1 g by mouth 4 (four) times daily.   0    Scheduled: . buPROPion  150 mg Oral BID  . cloNIDine  0.1 mg Oral BID  . enoxaparin (LOVENOX) injection  40 mg Subcutaneous Q24H  . hydrALAZINE  50 mg Oral TID  . metoprolol tartrate  100 mg Oral BID  . mometasone-formoterol  2 puff Inhalation BID  . montelukast  10 mg Oral QHS  . paliperidone  6 mg Oral QHS  . pantoprazole  40 mg Oral BID  . senna-docusate  1 tablet Oral QHS  . sucralfate  1 g Oral QID   Continuous:  [id number] **OR** acetaminophen (TYLENOL) oral liquid 160 mg/5 mL **OR** acetaminophen, albuterol  Assessment/Plan: This is a 58 year old male with a history of A fib (not on anticoagulation due to GI bleed), CKD, HTN, HLD, pacemake, and schizophrenia who presented with agitation, worsening confusion, and lower extremity shaking. He stated that he did not take his BP medications yesterday. He was noted to be hypertensive up to 809 systolic. CT head showed diffuse cortical atrophy with no hemorrhage or acute findings. CBC and BMP relatively unremarkable. Today patient is much more alert and oriented, able to answer why he was here, what happened, and perform executive functions. He did not appear dysarthric today and no focal neurological deficits noted. BP have been better controlled today, most recent  BP was 143/89. CBC and BMP relatively unremarkable. EEG showed a normal EEG, awake and asleep, no focal  lateralizing or epileptiform features It's possible that this could be just related to his hypertension causing hypertensive urgency 2/2 medication non-complaince however will need to further evaluate for other causes such as seizures or new onset CVA.   Plan: -Continue BP control, goal SBP <160 -Frequent neuro checks -Will f/u on MRI brain -Will need outpatient neurology follow up -Further recommendations per attending   LOS: 0 days   Asencion Noble, M.D. PGY2 11/20/2018 9:13 AM  Attending addendum Patient seen and examined Imaging reviewed independently. MRI of the brain negative for acute stroke or bleed. Examination did not reveal any evidence of aphasia or dysarthria.  He is oriented x3. No motor or sensory deficits  MRI of the brain negative for acute process EEG-normal  Most likely hypertensive emergency causing altered mental status.  Recommendations - Blood pressure control as you would for hypertensive emergency. - Follow-up outpatient with primary care-history of A. fib, consider anticoagulation for long-term stroke prevention. -Please recall neurology as needed.  -- Amie Portland, MD [facility] Pager: [phone] If 7pm to 7am, please call on call as listed on AMION.

## 2018-11-20 NOTE — ED Notes (Signed)
ED TO INPATIENT HANDOFF REPORT  ED Nurse Name and Phone #:  Otila Kluver @ [phone]  S Name/Age/Gender Peter Marsh 58 y.o. male Room/Bed: TRAAC/TRAAC  Code Status   Code Status: Prior  Home/SNF/Other Home Patient oriented to: self, place, time and situation Is this baseline? Yes   Triage Complete: Triage complete  Chief Complaint CODE STROKE    Triage Note Pt bib ems from home after wife called out for sudden onset agitation and difficulty getting his words out. LKW 1930 today. Pt not answering questions and not making sense on arrival. 203/130, HR 108 LBBB, 95% RA. 18G LAC.    Allergies Allergies  Allergen Reactions  . Methylpyrrolidone Hives    froze the intestine  . Niacin Other (See Comments) and Nausea And Vomiting    Flushing, itching, tingling   . Norvasc [Amlodipine Besylate]     Swollen Feet  . Oxybutynin Chloride [Oxybutynin Chloride Er] Other (See Comments)    froze the intestine  . Vesicare [Solifenacin Succinate] Other (See Comments)    Froze the intestine   . Ciprofloxacin Other (See Comments) and Rash    Felt flushed Unknown   . Oxybutynin Rash    unknown  . Solifenacin Rash    unknown    Level of Care/Admitting Diagnosis ED Disposition    ED Disposition Condition [facility] Area: [facility] [[id number]]  Level of Care: Telemetry Medical [104]  I expect the patient will be discharged within 24 hours: No (not a candidate for 5C-Observation unit)  Covid Evaluation: Asymptomatic Screening Protocol (No Symptoms)  Diagnosis: Encephalopathy [127517]  Admitting Physician: Lenore Cordia [[id number]]  Attending Physician: Lenore Cordia [[id number]]  PT Class (Do Not Modify): Observation [104]  PT Acc Code (Do Not Modify): Observation [10022]       B Medical/Surgery History Past Medical History:  Diagnosis Date  . Asthma    uses inhalers   . Bilateral carotid bruits   . Cardiac conduction disorder 2018   s/p MDT PPM   . Chronic kidney disease    bladder interstial cystitis   . Chronic kidney disease (CKD), stage IV (severe) ([name])    followed by Dr. Joelyn Oms at [facility]  . Depression   . GERD (gastroesophageal reflux disease)   . History of stomach ulcers 2001  . Hypertension   . LBBB (left bundle branch block)   . Lower extremity edema   . Mild intermittent asthma without complication   . Mixed hyperlipidemia   . Paroxysmal atrial flutter ([place])   . PONV (postoperative nausea and vomiting)   . Schizophrenia [facility])    Past Surgical History:  Procedure Laterality Date  . APPENDECTOMY    . ELECTROPHYSIOLOGY STUDY N/A 02/26/2017   Procedure: ELECTROPHYSIOLOGY STUDY;  Surgeon: Evans Lance, MD;  Location: [place] [facility];  Service: Cardiovascular;  Laterality: N/A;  . ESOPHAGOGASTRODUODENOSCOPY (EGD) WITH PROPOFOL N/A 09/18/2016   Procedure: ESOPHAGOGASTRODUODENOSCOPY (EGD) WITH PROPOFOL;  Surgeon: Ronnette Juniper, MD;  Location: [place];  Service: Gastroenterology;  Laterality: N/A;  . LUMBAR LAMINECTOMY/DECOMPRESSION MICRODISCECTOMY  04/19/2011   Procedure: LUMBAR LAMINECTOMY/DECOMPRESSION MICRODISCECTOMY;  Surgeon: Tobi Bastos;  Location: [facility];  Service: Orthopedics;  Laterality: Left;  Hemi LAminectomy/Microdiscectomy Lumbar four  - Lumbar five  on the Left (X-Ray)  . PACEMAKER IMPLANT N/A 02/26/2017   Procedure: PACEMAKER IMPLANT;  Surgeon: Evans Lance, MD;  Location: [place] [facility];  Service: Cardiovascular;  Laterality: N/A;  . TONSILLECTOMY  A IV Location/Drains/Wounds Patient Lines/Drains/Airways Status   Active Line/Drains/Airways    Name:   Placement date:   Placement time:   Site:   Days:   Peripheral IV 11/19/18 Left Antecubital   11/19/18    2117    Antecubital   1   Incision 04/19/11 Back Other (Comment)   04/19/11    1049     2772   Incision (Closed) 02/27/17 Chest Left   02/27/17    0815     631          Intake/Output Last 24 hours No intake  or output data in the 24 hours ending 11/20/18 0014  Labs/Imaging Results for orders placed or performed during the hospital encounter of 11/19/18 (from the past 48 hour(s))  Ethanol     Status: None   Collection Time: 11/19/18  9:07 PM  Result Value Ref Range   Alcohol, Ethyl (B) <10 <10 mg/dL    Comment: (NOTE) Lowest detectable limit for serum alcohol is 10 mg/dL. For medical purposes only. Performed at [facility], [place] [address] [place], [place] [address]   Protime-INR     Status: None   Collection Time: 11/19/18  9:07 PM  Result Value Ref Range   Prothrombin Time 14.4 11.4 - 15.2 seconds   INR 1.1 0.8 - 1.2    Comment: (NOTE) INR goal varies based on device and disease states. Performed at [facility], [place] [address] [place], [place] [address]   APTT     Status: None   Collection Time: 11/19/18  9:07 PM  Result Value Ref Range   aPTT 29 24 - 36 seconds    Comment: Performed at [facility] [address], [place] [address]  CBC     Status: None   Collection Time: 11/19/18  9:07 PM  Result Value Ref Range   WBC 8.6 4.0 - 10.5 K/uL   RBC 5.20 4.22 - 5.81 MIL/uL   Hemoglobin 14.5 13.0 - 17.0 g/dL   HCT 45.7 39.0 - 52.0 %   MCV 87.9 80.0 - 100.0 fL   MCH 27.9 26.0 - 34.0 pg   MCHC 31.7 30.0 - 36.0 g/dL   RDW 12.9 11.5 - 15.5 %   Platelets 234 150 - 400 K/uL   nRBC 0.0 0.0 - 0.2 %    Comment: Performed at [facility], [place] [address], [place], [place] [address]  Differential     Status: Abnormal   Collection Time: 11/19/18  9:07 PM  Result Value Ref Range   Neutrophils Relative % 57 %   Neutro Abs 4.9 1.7 - 7.7 K/uL   Lymphocytes Relative 28 %   Lymphs Abs 2.4 0.7 - 4.0 K/uL   Monocytes Relative 13 %   Monocytes Absolute 1.1 (H) 0.1 - 1.0 K/uL   Eosinophils Relative 2 %   Eosinophils Absolute 0.2 0.0 - 0.5 K/uL   Basophils Relative 0 %   Basophils Absolute 0.0 0.0 - 0.1 K/uL   Immature Granulocytes 0 %   Abs Immature  Granulocytes 0.02 0.00 - 0.07 K/uL    Comment: Performed at [facility] [address], [place],  [address]  Comprehensive metabolic panel     Status: Abnormal   Collection Time: 11/19/18  9:07 PM  Result Value Ref Range   Sodium 141 135 - 145 mmol/L   Potassium 3.4 (L) 3.5 - 5.1 mmol/L   Chloride 108 98 -  111 mmol/L   CO2 21 (L) 22 - 32 mmol/L   Glucose, Bld 103 (H) 70 - 99 mg/dL   BUN 15 6 - 20 mg/dL   Creatinine, Ser 2.08 (H) 0.61 - 1.24 mg/dL   Calcium 9.7 8.9 - 10.3 mg/dL   Total Protein 7.2 6.5 - 8.1 g/dL   Albumin 4.1 3.5 - 5.0 g/dL   AST 22 15 - 41 U/L   ALT 17 0 - 44 U/L   Alkaline Phosphatase 59 38 - 126 U/L   Total Bilirubin 1.2 0.3 - 1.2 mg/dL   GFR calc non Af Amer 34 (L) >60 mL/min   GFR calc Af Amer 39 (L) >60 mL/min   Anion gap 12 5 - 15    Comment: Performed at [facility] [address] [place], [place] [address]  I-stat chem 8, ED     Status: Abnormal   Collection Time: 11/19/18  9:12 PM  Result Value Ref Range   Sodium 142 135 - 145 mmol/L   Potassium 3.4 (L) 3.5 - 5.1 mmol/L   Chloride 107 98 - 111 mmol/L   BUN 17 6 - 20 mg/dL   Creatinine, Ser 2.00 (H) 0.61 - 1.24 mg/dL   Glucose, Bld 102 (H) 70 - 99 mg/dL   Calcium, Ion 1.14 (L) 1.15 - 1.40 mmol/L   TCO2 24 22 - 32 mmol/L   Hemoglobin 15.0 13.0 - 17.0 g/dL   HCT 44.0 39.0 - 52.0 %   Ct Code Stroke Cta Head W/wo Contrast  Result Date: 11/19/2018 CLINICAL DATA:  58 year old male code stroke presentation. Abnormal speech. EXAM: CT ANGIOGRAPHY HEAD AND NECK CT PERFUSION BRAIN TECHNIQUE: Multidetector CT imaging of the head and neck was performed using the standard protocol during bolus administration of intravenous contrast. Multiplanar CT image reconstructions and MIPs were obtained to evaluate the vascular anatomy. Carotid stenosis measurements (when applicable) are obtained utilizing NASCET criteria, using the distal internal carotid diameter as the denominator. Multiphase CT  imaging of the brain was performed following IV bolus contrast injection. Subsequent parametric perfusion maps were calculated using RAPID software. CONTRAST:  154mL OMNIPAQUE IOHEXOL 350 MG/ML SOLN COMPARISON:  Head CT without contrast 2113 hours today. CT head without contrast 10/15/2017. Report of brain MRI 04/13/2005. FINDINGS: CT Brain Perfusion Findings: ASPECTS: 10 at 2113 hours today. CBF (<30%) Volume: None Perfusion (Tmax>6.0s) volume: None hypoperfusion index not applicable Mismatch Volume: None Infarction Location:Not applicable CTA NECK Skeleton: Absent dentition. Mild for age cervical spine degeneration. No acute osseous abnormality identified. Upper chest: Upper lobe emphysema. Left chest cardiac pacemaker type device. Other neck: Negative.  No lymphadenopathy. Aortic arch: Mild to moderate soft and calcified arch atherosclerosis. Three vessel arch configuration. Right carotid system: Minimal calcified plaque at the right ICA origin with no stenosis. Left carotid system: Soft plaque in the anterior left CCA at the level of the larynx without stenosis (series 5, image 135). Minimal plaque at the left carotid bifurcation without stenosis. Vertebral arteries: Mild if any plaque in the proximal right subclavian artery. Normal right vertebral artery origin. The right vertebral is non dominant and diminutive but patent to the skull base without stenosis. Mild calcified plaque in the proximal left subclavian artery without stenosis. Normal left vertebral artery origin. Dominant left vertebral artery is patent to the skull base. Portion of the proximal vertebral is obscured by venous contrast but no left vertebral stenosis is identified. CTA HEAD Posterior circulation: Dominant distal left vertebral artery. Normal PICA origins and  vertebrobasilar junction. Patent basilar artery without stenosis. Normal SCA and right PCA origin. Fetal type left PCA origin. Right posterior communicating artery is present.  Bilateral PCA branches are within normal limits. Anterior circulation: Both ICA siphons are patent. On the left there is minimal calcified plaque with no stenosis. Normal left ophthalmic and posterior communicating artery origins. On the right there is no plaque or stenosis. Normal right ophthalmic and posterior communicating artery origins. Normal MCA and ACA origins. The right A1 is dominant, the left is diminutive. Anterior communicating artery and visible ACA branches are within normal limits. Both MCA M1 segments and bifurcations are patent without stenosis. No left or right MCA branch occlusion is identified. Asymmetric branching pattern noted, but no asymmetric paucity of branches identified. Venous sinuses: Early contrast timing, venous sinuses not well evaluated. Anatomic variants: Dominant left and diminutive right vertebral arteries. Fetal type left PCA origin. Dominant right and diminutive left ACA A1 segments. Review of the MIP images confirms the above findings IMPRESSION: 1. Negative for large vessel occlusion, and CTP detects no core infarct or ischemic penumbra. This was discussed by telephone with Dr. Kerney Elbe on 11/19/2018 at 2150 hours. 2. Aortic Atherosclerosis (ICD10-I70.0), but mild atherosclerosis in the head and neck with no arterial stenosis identified. 3.  Emphysema (ICD10-J43.9). Electronically Signed   By: Genevie Ann M.D.   On: 11/19/2018 21:53   Ct Code Stroke Cta Neck W/wo Contrast  Result Date: 11/19/2018 CLINICAL DATA:  58 year old male code stroke presentation. Abnormal speech. EXAM: CT ANGIOGRAPHY HEAD AND NECK CT PERFUSION BRAIN TECHNIQUE: Multidetector CT imaging of the head and neck was performed using the standard protocol during bolus administration of intravenous contrast. Multiplanar CT image reconstructions and MIPs were obtained to evaluate the vascular anatomy. Carotid stenosis measurements (when applicable) are obtained utilizing NASCET criteria, using the distal  internal carotid diameter as the denominator. Multiphase CT imaging of the brain was performed following IV bolus contrast injection. Subsequent parametric perfusion maps were calculated using RAPID software. CONTRAST:  12mL OMNIPAQUE IOHEXOL 350 MG/ML SOLN COMPARISON:  Head CT without contrast 2113 hours today. CT head without contrast 10/15/2017. Report of brain MRI 04/13/2005. FINDINGS: CT Brain Perfusion Findings: ASPECTS: 10 at 2113 hours today. CBF (<30%) Volume: None Perfusion (Tmax>6.0s) volume: None hypoperfusion index not applicable Mismatch Volume: None Infarction Location:Not applicable CTA NECK Skeleton: Absent dentition. Mild for age cervical spine degeneration. No acute osseous abnormality identified. Upper chest: Upper lobe emphysema. Left chest cardiac pacemaker type device. Other neck: Negative.  No lymphadenopathy. Aortic arch: Mild to moderate soft and calcified arch atherosclerosis. Three vessel arch configuration. Right carotid system: Minimal calcified plaque at the right ICA origin with no stenosis. Left carotid system: Soft plaque in the anterior left CCA at the level of the larynx without stenosis (series 5, image 135). Minimal plaque at the left carotid bifurcation without stenosis. Vertebral arteries: Mild if any plaque in the proximal right subclavian artery. Normal right vertebral artery origin. The right vertebral is non dominant and diminutive but patent to the skull base without stenosis. Mild calcified plaque in the proximal left subclavian artery without stenosis. Normal left vertebral artery origin. Dominant left vertebral artery is patent to the skull base. Portion of the proximal vertebral is obscured by venous contrast but no left vertebral stenosis is identified. CTA HEAD Posterior circulation: Dominant distal left vertebral artery. Normal PICA origins and vertebrobasilar junction. Patent basilar artery without stenosis. Normal SCA and right PCA origin. Fetal type left PCA  origin.  Right posterior communicating artery is present. Bilateral PCA branches are within normal limits. Anterior circulation: Both ICA siphons are patent. On the left there is minimal calcified plaque with no stenosis. Normal left ophthalmic and posterior communicating artery origins. On the right there is no plaque or stenosis. Normal right ophthalmic and posterior communicating artery origins. Normal MCA and ACA origins. The right A1 is dominant, the left is diminutive. Anterior communicating artery and visible ACA branches are within normal limits. Both MCA M1 segments and bifurcations are patent without stenosis. No left or right MCA branch occlusion is identified. Asymmetric branching pattern noted, but no asymmetric paucity of branches identified. Venous sinuses: Early contrast timing, venous sinuses not well evaluated. Anatomic variants: Dominant left and diminutive right vertebral arteries. Fetal type left PCA origin. Dominant right and diminutive left ACA A1 segments. Review of the MIP images confirms the above findings IMPRESSION: 1. Negative for large vessel occlusion, and CTP detects no core infarct or ischemic penumbra. This was discussed by telephone with Dr. Kerney Elbe on 11/19/2018 at 2150 hours. 2. Aortic Atherosclerosis (ICD10-I70.0), but mild atherosclerosis in the head and neck with no arterial stenosis identified. 3.  Emphysema (ICD10-J43.9). Electronically Signed   By: Genevie Ann M.D.   On: 11/19/2018 21:53   Ct Code Stroke Cta Cerebral Perfusion W/wo Contrast  Result Date: 11/19/2018 CLINICAL DATA:  58 year old male code stroke presentation. Abnormal speech. EXAM: CT ANGIOGRAPHY HEAD AND NECK CT PERFUSION BRAIN TECHNIQUE: Multidetector CT imaging of the head and neck was performed using the standard protocol during bolus administration of intravenous contrast. Multiplanar CT image reconstructions and MIPs were obtained to evaluate the vascular anatomy. Carotid stenosis measurements (when  applicable) are obtained utilizing NASCET criteria, using the distal internal carotid diameter as the denominator. Multiphase CT imaging of the brain was performed following IV bolus contrast injection. Subsequent parametric perfusion maps were calculated using RAPID software. CONTRAST:  168mL OMNIPAQUE IOHEXOL 350 MG/ML SOLN COMPARISON:  Head CT without contrast 2113 hours today. CT head without contrast 10/15/2017. Report of brain MRI 04/13/2005. FINDINGS: CT Brain Perfusion Findings: ASPECTS: 10 at 2113 hours today. CBF (<30%) Volume: None Perfusion (Tmax>6.0s) volume: None hypoperfusion index not applicable Mismatch Volume: None Infarction Location:Not applicable CTA NECK Skeleton: Absent dentition. Mild for age cervical spine degeneration. No acute osseous abnormality identified. Upper chest: Upper lobe emphysema. Left chest cardiac pacemaker type device. Other neck: Negative.  No lymphadenopathy. Aortic arch: Mild to moderate soft and calcified arch atherosclerosis. Three vessel arch configuration. Right carotid system: Minimal calcified plaque at the right ICA origin with no stenosis. Left carotid system: Soft plaque in the anterior left CCA at the level of the larynx without stenosis (series 5, image 135). Minimal plaque at the left carotid bifurcation without stenosis. Vertebral arteries: Mild if any plaque in the proximal right subclavian artery. Normal right vertebral artery origin. The right vertebral is non dominant and diminutive but patent to the skull base without stenosis. Mild calcified plaque in the proximal left subclavian artery without stenosis. Normal left vertebral artery origin. Dominant left vertebral artery is patent to the skull base. Portion of the proximal vertebral is obscured by venous contrast but no left vertebral stenosis is identified. CTA HEAD Posterior circulation: Dominant distal left vertebral artery. Normal PICA origins and vertebrobasilar junction. Patent basilar artery  without stenosis. Normal SCA and right PCA origin. Fetal type left PCA origin. Right posterior communicating artery is present. Bilateral PCA branches are within normal limits. Anterior circulation: Both ICA siphons  are patent. On the left there is minimal calcified plaque with no stenosis. Normal left ophthalmic and posterior communicating artery origins. On the right there is no plaque or stenosis. Normal right ophthalmic and posterior communicating artery origins. Normal MCA and ACA origins. The right A1 is dominant, the left is diminutive. Anterior communicating artery and visible ACA branches are within normal limits. Both MCA M1 segments and bifurcations are patent without stenosis. No left or right MCA branch occlusion is identified. Asymmetric branching pattern noted, but no asymmetric paucity of branches identified. Venous sinuses: Early contrast timing, venous sinuses not well evaluated. Anatomic variants: Dominant left and diminutive right vertebral arteries. Fetal type left PCA origin. Dominant right and diminutive left ACA A1 segments. Review of the MIP images confirms the above findings IMPRESSION: 1. Negative for large vessel occlusion, and CTP detects no core infarct or ischemic penumbra. This was discussed by telephone with Dr. Kerney Elbe on 11/19/2018 at 2150 hours. 2. Aortic Atherosclerosis (ICD10-I70.0), but mild atherosclerosis in the head and neck with no arterial stenosis identified. 3.  Emphysema (ICD10-J43.9). Electronically Signed   By: Genevie Ann M.D.   On: 11/19/2018 21:53   Dg Chest Port 1 View  Result Date: 11/19/2018 CLINICAL DATA:  Sudden onset of agitation EXAM: PORTABLE CHEST 1 VIEW COMPARISON:  06/24/2018 FINDINGS: Left-sided pacing device as before. Mild cardiomegaly. No consolidation or effusion. Aortic atherosclerosis. No pneumothorax. IMPRESSION: No active disease.  Cardiomegaly Electronically Signed   By: Donavan Foil M.D.   On: 11/19/2018 23:59   Ct Head Code Stroke Wo  Contrast  Result Date: 11/19/2018 CLINICAL DATA:  Code stroke. Altered level of consciousness. Abnormal speech. EXAM: CT HEAD WITHOUT CONTRAST TECHNIQUE: Contiguous axial images were obtained from the base of the skull through the vertex without intravenous contrast. COMPARISON:  10/15/2017 FINDINGS: Brain: Enlargement of the ventricles and sulci as well as prominent extra-axial CSF spaces over both cerebral convexities and along the interhemispheric fissure in the frontal region are unchanged and compatible with age advanced cerebral atrophy. Coexistent small subdural hygromas are not excluded. No acute infarct, acute intracranial hemorrhage, mass, or midline shift is identified. Periventricular white matter hypodensities are similar to the prior study and nonspecific but compatible with mild chronic small vessel ischemic disease. Vascular: No hyperdense vessel. Skull: No fracture or focal osseous lesion. Sinuses/Orbits: Paranasal sinuses and mastoid air cells are clear. Unremarkable orbits. Other: None. ASPECTS [facility] Stroke Program Early CT Score) - Ganglionic level infarction (caudate, lentiform nuclei, internal capsule, insula, M1-M3 cortex): 7 - Supraganglionic infarction (M4-M6 cortex): 3 Total score (0-10 with 10 being normal): 10 IMPRESSION: 1. No evidence of acute intracranial abnormality. 2. ASPECTS is 10. 3. Age advanced cerebral atrophy and mild chronic small vessel ischemic disease. These results were communicated to Dr. Cheral Marker at 9:22 pm on 11/19/2018 by text page via the [facility] messaging system. Electronically Signed   By: Logan Bores M.D.   On: 11/19/2018 21:22    Pending Labs Unresulted Labs (From admission, onward)    Start     Ordered   11/19/18 2315  SARS Coronavirus 2 (CEPHEID - Performed in [place] hospital lab), [place]  (Asymptomatic Patients Labs)  Once,   STAT    Question:  Rule Out  Answer:  Yes   11/19/18 2314   11/19/18 2104  Urine rapid drug screen (hosp performed)   ONCE - STAT,   STAT     11/19/18 2104   11/19/18 2104  Urinalysis, Routine w reflex microscopic  ONCE - STAT,   STAT     11/19/18 2104          Vitals/Pain Today's Vitals   11/19/18 2145 11/19/18 2200 11/19/18 2215 11/19/18 2230  BP: (!) 183/89 (!) 170/92 (!) 168/115 (!) 183/89  Pulse: 94 89 91 90  Resp: (!) 27 (!) 22 20   SpO2: 94% 95% 94% 96%  Weight:      Height:        Isolation Precautions No active isolations  Medications Medications  sodium chloride 0.9 % bolus 500 mL (0 mLs Intravenous Stopped 11/19/18 2257)    Followed by  0.9 %  sodium chloride infusion (1,000 mLs Intravenous New Bag/Given 11/19/18 2257)  sterile water (preservative free) injection (has no administration in time range)  iohexol (OMNIPAQUE) 350 MG/ML injection 100 mL (100 mLs Intravenous Contrast Given 11/19/18 2135)  labetalol (NORMODYNE) injection 10 mg (10 mg Intravenous Given 11/19/18 2231)  ziprasidone (GEODON) injection 10 mg (10 mg Intramuscular Given 11/19/18 2234)    Mobility walks     Focused Assessments Cardiac Assessment Handoff:  Cardiac Rhythm: Atrial paced Lab Results  Component Value Date   CKTOTAL 73 01/10/2010   CKMB 1.0 01/10/2010   TROPONINI <0.03 06/24/2018   Lab Results  Component Value Date   DDIMER 1.36 (H) 10/15/2017   Does the Patient currently have chest pain? No  , Neuro Assessment Handoff:  Swallow screen pass? Yes  Cardiac Rhythm: Atrial paced NIH Stroke Scale ( + Modified Stroke Scale Criteria)  Interval: Initial Level of Consciousness (1a.)   : Alert, keenly responsive LOC Questions (1b. )   +: Answers neither question correctly LOC Commands (1c. )   + : Performs neither task correctly Best Gaze (2. )  +: Normal Visual (3. )  +: No visual loss Facial Palsy (4. )    : Normal symmetrical movements Motor Arm, Left (5a. )   +: No drift Motor Arm, Right (5b. )   +: No drift Motor Leg, Left (6a. )   +: No drift Motor Leg, Right (6b. )   +: No  drift Limb Ataxia (7. ): Absent Sensory (8. )   +: Normal, no sensory loss Best Language (9. )   +: Severe aphasia Dysarthria (10. ): Normal Extinction/Inattention (11.)   +: No Abnormality Modified SS Total  +: 6 Complete NIHSS TOTAL: 6     Neuro Assessment: Exceptions to [facility] Neuro Checks:   Initial (11/19/18 2150)  Last Documented NIHSS Modified Score: 6 (11/19/18 2150) Has TPA been given? No If patient is a Neuro Trauma and patient is going to OR before floor call report to [place] nurse: [phone] or [phone]     R Recommendations: See Admitting Provider Note  Report given to:   Additional Notes:

## 2018-11-20 NOTE — Procedures (Signed)
ELECTROENCEPHALOGRAM REPORT   Patient: Peter Marsh       Room #: 0T62U EEG No. ID: [id number] Age: 58 y.o.        Sex: male Referring Physician: Eliseo Squires Report Date:  11/20/2018        Interpreting Physician: Alexis Goodell  History: Peter Marsh is an 58 y.o. male with altered mental status  Medications:  Wellbutrin, Clonidine, Apresoline, Dulera, Invega  Conditions of Recording:  This is a 21 channel routine scalp EEG performed with bipolar and monopolar montages arranged in accordance to the international 10/20 system of electrode placement. One channel was dedicated to EKG recording.  The patient is in the awake, drowsy and asleep states.  Description:  The waking background activity consists of a low voltage, symmetrical, fairly well organized, 9 Hz alpha activity, seen from the parieto-occipital and posterior temporal regions.  Low voltage fast activity, poorly organized, is seen anteriorly and is at times superimposed on more posterior regions.  A mixture of theta and alpha rhythms are seen from the central and temporal regions. The patient drowses with slowing to irregular, low voltage theta and beta activity.   The patient goes in to a light sleep briefly with symmetrical sleep spindles, vertex central sharp transients and irregular slow activity.  No epileptiform activity is noted.   Hyperventilation and intermittent photic stimulation were not performed.  IMPRESSION: Normal electroencephalogram, awake and asleep. There are no focal lateralizing or epileptiform features.   Alexis Goodell, MD Neurology [phone] 11/20/2018, 11:17 AM

## 2018-11-20 NOTE — ED Notes (Signed)
Patient aware that we need urine specimen.

## 2018-11-20 NOTE — Progress Notes (Signed)
Progress Note    Peter Marsh  VHQ:[id number] DOB: July 05, 1960  DOA: 11/19/2018 PCP: Marda Stalker, PA-C    Brief Narrative:     Medical records reviewed and are as summarized below:  Peter Marsh is an 58 y.o. male with medical history significant for conduction system disease s/p PPM not on anticoagulation, CKD stage III, hypertension, hyperlipidemia, LBBB, GERD, esophageal ulcer, asthma, and schizophrenia who is admitted with acute encephalopathy, agitation, speech abnormality.  Assessment/Plan:   Principal Problem:   Encephalopathy Active Problems:   Asthma   CKD (chronic kidney disease), stage III ([name])   Schizophrenia ([name])   Paroxysmal atrial fibrillation ([name])   Essential hypertension   GERD (gastroesophageal reflux disease)  Acute encephalopathy: Patient presented with reported altered mental status, agitation, and speech abnormality without dysarthria.  -CTA head/neck with CTP negative for large vessel occlusion or perfusion deficit -MRI brain pending -Symptomatically improved with BP control and IV Geodon given in ED -awaiturinalysis and UDS -EEG normal PT eval pending  Hypertension: Significantly hypertensive on ED arrival.  Improved with IV labetalol.  Patient is also improved with BP control. -Resume home clonidine 0.1 mg twice daily, hydralazine 50 mg 3 times daily, Lopressor 100 mg twice daily (BP lower end of normal on this regimen-- ? Compliance-- had not taken on day of admission)  History of esophageal ulcer/upper GI bleed: Last EGD 09/18/2016 by Dr. Therisa Doyne showed esophageal ulcer without active bleeding. -Continue Protonix and Carafate  Hypokalemia: Mild, supplementing.  CKD stage III: GFR slightly decreased from baseline.  Continue IV fluids post contrast study and recheck in a.m.  Conduction system disease status post PPM: EP study in October 2018 suggestive of severe conduction system disease without evidence of inducible  atrial flutter or atrial fibrillation.  At that time it was determined that systemic anticoagulation risk is greater than benefit due to no inducible A. fib/flutter.  Asthma: Continue Singulair, Dulera, and as needed albuterol.  Depression: Continue bupropion.  Schizophrenia: Concern for acute psychosis on ED arrival.  Mentation mood stabilized by the time of admission evaluation.   S/p geodon  obesity Body mass index is 30.16 kg/m.   Family Communication/Anticipated D/C date and plan/Code Status   DVT prophylaxis: Lovenox ordered. Code Status: Full Code.  Family Communication:  Disposition Plan: pending MRI and PT Eval   Medical Consultants:    Neurology   Anti-Infectives:    None  Subjective:   No chest pain, no SOB   Objective:    Vitals:   11/20/18 0139 11/20/18 0339 11/20/18 0539 11/20/18 0738  BP: (!) 154/93 130/83 106/73 (!) 143/89  Pulse: 72 (!) 59 (!) 58 (!) 59  Resp: 20 14 17 18   Temp: 98.1 F (36.7 C) 98.2 F (36.8 C) 98.3 F (36.8 C) 98.3 F (36.8 C)  TempSrc: Oral Oral Oral Axillary  SpO2: 97% 96% 94% 94%  Weight:      Height:       No intake or output data in the 24 hours ending 11/20/18 1055 Filed Weights   11/19/18 2100 11/20/18 0137  Weight: 103.6 kg 103.7 kg    Exam: In bed, occasional word finding difficulty Alert and oriented rrr No increased work of breathing Pleasant and cooperative  Data Reviewed:   I have personally reviewed following labs and imaging studies:  Labs: Labs show the following:   Basic Metabolic Panel: Recent Labs  Lab 11/19/18 2107 11/19/18 2112 11/20/18 0432  NA 141 142 142  K 3.4* 3.4*  3.7  CL 108 107 111  CO2 21*  --  21*  GLUCOSE 103* 102* 110*  BUN 15 17 14   CREATININE 2.08* 2.00* 1.85*  CALCIUM 9.7  --  8.8*   GFR Estimated Creatinine Clearance: 54.4 mL/min (A) (by C-G formula based on SCr of 1.85 mg/dL (H)). Liver Function Tests: Recent Labs  Lab 11/19/18 2107  AST 22    ALT 17  ALKPHOS 59  BILITOT 1.2  PROT 7.2  ALBUMIN 4.1   No results for input(s): LIPASE, AMYLASE in the last 168 hours. No results for input(s): AMMONIA in the last 168 hours. Coagulation profile Recent Labs  Lab 11/19/18 2107  INR 1.1    CBC: Recent Labs  Lab 11/19/18 2107 11/19/18 2112 11/20/18 0432  WBC 8.6  --  6.6  NEUTROABS 4.9  --   --   HGB 14.5 15.0 13.0  HCT 45.7 44.0 42.4  MCV 87.9  --  89.6  PLT 234  --  201   Cardiac Enzymes: No results for input(s): CKTOTAL, CKMB, CKMBINDEX, TROPONINI in the last 168 hours. BNP (last 3 results) No results for input(s): PROBNP in the last 8760 hours. CBG: No results for input(s): GLUCAP in the last 168 hours. D-Dimer: No results for input(s): DDIMER in the last 72 hours. Hgb A1c: Recent Labs    11/20/18 0432  HGBA1C 5.7*   Lipid Profile: Recent Labs    11/20/18 0432  CHOL 169  HDL 25*  LDLCALC 125*  TRIG 96  CHOLHDL 6.8   Thyroid function studies: No results for input(s): TSH, T4TOTAL, T3FREE, THYROIDAB in the last 72 hours.  Invalid input(s): FREET3 Anemia work up: No results for input(s): VITAMINB12, FOLATE, FERRITIN, TIBC, IRON, RETICCTPCT in the last 72 hours. Sepsis Labs: Recent Labs  Lab 11/19/18 2107 11/20/18 0432  WBC 8.6 6.6    Microbiology Recent Results (from the past 240 hour(s))  SARS Coronavirus 2 (CEPHEID - Performed in [place] hospital lab), Hosp Order     Status: None   Collection Time: 11/20/18 12:01 AM   Specimen: Nasopharyngeal Swab  Result Value Ref Range Status   SARS Coronavirus 2 NEGATIVE NEGATIVE Final    Comment: (NOTE) If result is NEGATIVE SARS-CoV-2 target nucleic acids are NOT DETECTED. The SARS-CoV-2 RNA is generally detectable in upper and lower  respiratory specimens during the acute phase of infection. The lowest  concentration of SARS-CoV-2 viral copies this assay can detect is 250  copies / mL. A negative result does not preclude SARS-CoV-2 infection   and should not be used as the sole basis for treatment or other  patient management decisions.  A negative result may occur with  improper specimen collection / handling, submission of specimen other  than nasopharyngeal swab, presence of viral mutation(s) within the  areas targeted by this assay, and inadequate number of viral copies  (<250 copies / mL). A negative result must be combined with clinical  observations, patient history, and epidemiological information. If result is POSITIVE SARS-CoV-2 target nucleic acids are DETECTED. The SARS-CoV-2 RNA is generally detectable in upper and lower  respiratory specimens dur ing the acute phase of infection.  Positive  results are indicative of active infection with SARS-CoV-2.  Clinical  correlation with patient history and other diagnostic information is  necessary to determine patient infection status.  Positive results do  not rule out bacterial infection or co-infection with other viruses. If result is PRESUMPTIVE POSTIVE SARS-CoV-2 nucleic acids MAY BE PRESENT.  A presumptive positive result was obtained on the submitted specimen  and confirmed on repeat testing.  While 2019 novel coronavirus  (SARS-CoV-2) nucleic acids may be present in the submitted sample  additional confirmatory testing may be necessary for epidemiological  and / or clinical management purposes  to differentiate between  SARS-CoV-2 and other Sarbecovirus currently known to infect humans.  If clinically indicated additional testing with an alternate test  methodology [id number]) is advised. The SARS-CoV-2 RNA is generally  detectable in upper and lower respiratory sp ecimens during the acute  phase of infection. The expected result is Negative. Fact Sheet for Patients:  StrictlyIdeas.no Fact Sheet for Healthcare Providers: BankingDealers.co.za This test is not yet approved or cleared by the [place] FDA  and has been authorized for detection and/or diagnosis of SARS-CoV-2 by FDA under an Emergency Use Authorization (EUA).  This EUA will remain in effect (meaning this test can be used) for the duration of the COVID-19 declaration under Section 564(b)(1) of the Act, 21 U.S.C. section 360bbb-3(b)(1), unless the authorization is terminated or revoked sooner. Performed at [facility], [place] [address] [place], [place] [address]     Procedures and diagnostic studies:  Ct Code Stroke Cta Head W/wo Contrast  Result Date: 11/19/2018 CLINICAL DATA:  58 year old male code stroke presentation. Abnormal speech. EXAM: CT ANGIOGRAPHY HEAD AND NECK CT PERFUSION BRAIN TECHNIQUE: Multidetector CT imaging of the head and neck was performed using the standard protocol during bolus administration of intravenous contrast. Multiplanar CT image reconstructions and MIPs were obtained to evaluate the vascular anatomy. Carotid stenosis measurements (when applicable) are obtained utilizing NASCET criteria, using the distal internal carotid diameter as the denominator. Multiphase CT imaging of the brain was performed following IV bolus contrast injection. Subsequent parametric perfusion maps were calculated using RAPID software. CONTRAST:  16mL OMNIPAQUE IOHEXOL 350 MG/ML SOLN COMPARISON:  Head CT without contrast 2113 hours today. CT head without contrast 10/15/2017. Report of brain MRI 04/13/2005. FINDINGS: CT Brain Perfusion Findings: ASPECTS: 10 at 2113 hours today. CBF (<30%) Volume: None Perfusion (Tmax>6.0s) volume: None hypoperfusion index not applicable Mismatch Volume: None Infarction Location:Not applicable CTA NECK Skeleton: Absent dentition. Mild for age cervical spine degeneration. No acute osseous abnormality identified. Upper chest: Upper lobe emphysema. Left chest cardiac pacemaker type device. Other neck: Negative.  No lymphadenopathy. Aortic arch: Mild to moderate soft and calcified arch  atherosclerosis. Three vessel arch configuration. Right carotid system: Minimal calcified plaque at the right ICA origin with no stenosis. Left carotid system: Soft plaque in the anterior left CCA at the level of the larynx without stenosis (series 5, image 135). Minimal plaque at the left carotid bifurcation without stenosis. Vertebral arteries: Mild if any plaque in the proximal right subclavian artery. Normal right vertebral artery origin. The right vertebral is non dominant and diminutive but patent to the skull base without stenosis. Mild calcified plaque in the proximal left subclavian artery without stenosis. Normal left vertebral artery origin. Dominant left vertebral artery is patent to the skull base. Portion of the proximal vertebral is obscured by venous contrast but no left vertebral stenosis is identified. CTA HEAD Posterior circulation: Dominant distal left vertebral artery. Normal PICA origins and vertebrobasilar junction. Patent basilar artery without stenosis. Normal SCA and right PCA origin. Fetal type left PCA origin. Right posterior communicating artery is present. Bilateral PCA branches are within normal limits. Anterior circulation: Both ICA siphons are patent. On the left there is minimal calcified plaque with no stenosis. Normal  left ophthalmic and posterior communicating artery origins. On the right there is no plaque or stenosis. Normal right ophthalmic and posterior communicating artery origins. Normal MCA and ACA origins. The right A1 is dominant, the left is diminutive. Anterior communicating artery and visible ACA branches are within normal limits. Both MCA M1 segments and bifurcations are patent without stenosis. No left or right MCA branch occlusion is identified. Asymmetric branching pattern noted, but no asymmetric paucity of branches identified. Venous sinuses: Early contrast timing, venous sinuses not well evaluated. Anatomic variants: Dominant left and diminutive right vertebral  arteries. Fetal type left PCA origin. Dominant right and diminutive left ACA A1 segments. Review of the MIP images confirms the above findings IMPRESSION: 1. Negative for large vessel occlusion, and CTP detects no core infarct or ischemic penumbra. This was discussed by telephone with Dr. Kerney Elbe on 11/19/2018 at 2150 hours. 2. Aortic Atherosclerosis (ICD10-I70.0), but mild atherosclerosis in the head and neck with no arterial stenosis identified. 3.  Emphysema (ICD10-J43.9). Electronically Signed   By: Genevie Ann M.D.   On: 11/19/2018 21:53   Ct Code Stroke Cta Neck W/wo Contrast  Result Date: 11/19/2018 CLINICAL DATA:  59 year old male code stroke presentation. Abnormal speech. EXAM: CT ANGIOGRAPHY HEAD AND NECK CT PERFUSION BRAIN TECHNIQUE: Multidetector CT imaging of the head and neck was performed using the standard protocol during bolus administration of intravenous contrast. Multiplanar CT image reconstructions and MIPs were obtained to evaluate the vascular anatomy. Carotid stenosis measurements (when applicable) are obtained utilizing NASCET criteria, using the distal internal carotid diameter as the denominator. Multiphase CT imaging of the brain was performed following IV bolus contrast injection. Subsequent parametric perfusion maps were calculated using RAPID software. CONTRAST:  163mL OMNIPAQUE IOHEXOL 350 MG/ML SOLN COMPARISON:  Head CT without contrast 2113 hours today. CT head without contrast 10/15/2017. Report of brain MRI 04/13/2005. FINDINGS: CT Brain Perfusion Findings: ASPECTS: 10 at 2113 hours today. CBF (<30%) Volume: None Perfusion (Tmax>6.0s) volume: None hypoperfusion index not applicable Mismatch Volume: None Infarction Location:Not applicable CTA NECK Skeleton: Absent dentition. Mild for age cervical spine degeneration. No acute osseous abnormality identified. Upper chest: Upper lobe emphysema. Left chest cardiac pacemaker type device. Other neck: Negative.  No lymphadenopathy.  Aortic arch: Mild to moderate soft and calcified arch atherosclerosis. Three vessel arch configuration. Right carotid system: Minimal calcified plaque at the right ICA origin with no stenosis. Left carotid system: Soft plaque in the anterior left CCA at the level of the larynx without stenosis (series 5, image 135). Minimal plaque at the left carotid bifurcation without stenosis. Vertebral arteries: Mild if any plaque in the proximal right subclavian artery. Normal right vertebral artery origin. The right vertebral is non dominant and diminutive but patent to the skull base without stenosis. Mild calcified plaque in the proximal left subclavian artery without stenosis. Normal left vertebral artery origin. Dominant left vertebral artery is patent to the skull base. Portion of the proximal vertebral is obscured by venous contrast but no left vertebral stenosis is identified. CTA HEAD Posterior circulation: Dominant distal left vertebral artery. Normal PICA origins and vertebrobasilar junction. Patent basilar artery without stenosis. Normal SCA and right PCA origin. Fetal type left PCA origin. Right posterior communicating artery is present. Bilateral PCA branches are within normal limits. Anterior circulation: Both ICA siphons are patent. On the left there is minimal calcified plaque with no stenosis. Normal left ophthalmic and posterior communicating artery origins. On the right there is no plaque or stenosis. Normal right ophthalmic  and posterior communicating artery origins. Normal MCA and ACA origins. The right A1 is dominant, the left is diminutive. Anterior communicating artery and visible ACA branches are within normal limits. Both MCA M1 segments and bifurcations are patent without stenosis. No left or right MCA branch occlusion is identified. Asymmetric branching pattern noted, but no asymmetric paucity of branches identified. Venous sinuses: Early contrast timing, venous sinuses not well evaluated. Anatomic  variants: Dominant left and diminutive right vertebral arteries. Fetal type left PCA origin. Dominant right and diminutive left ACA A1 segments. Review of the MIP images confirms the above findings IMPRESSION: 1. Negative for large vessel occlusion, and CTP detects no core infarct or ischemic penumbra. This was discussed by telephone with Dr. Kerney Elbe on 11/19/2018 at 2150 hours. 2. Aortic Atherosclerosis (ICD10-I70.0), but mild atherosclerosis in the head and neck with no arterial stenosis identified. 3.  Emphysema (ICD10-J43.9). Electronically Signed   By: Genevie Ann M.D.   On: 11/19/2018 21:53   Ct Code Stroke Cta Cerebral Perfusion W/wo Contrast  Result Date: 11/19/2018 CLINICAL DATA:  58 year old male code stroke presentation. Abnormal speech. EXAM: CT ANGIOGRAPHY HEAD AND NECK CT PERFUSION BRAIN TECHNIQUE: Multidetector CT imaging of the head and neck was performed using the standard protocol during bolus administration of intravenous contrast. Multiplanar CT image reconstructions and MIPs were obtained to evaluate the vascular anatomy. Carotid stenosis measurements (when applicable) are obtained utilizing NASCET criteria, using the distal internal carotid diameter as the denominator. Multiphase CT imaging of the brain was performed following IV bolus contrast injection. Subsequent parametric perfusion maps were calculated using RAPID software. CONTRAST:  173mL OMNIPAQUE IOHEXOL 350 MG/ML SOLN COMPARISON:  Head CT without contrast 2113 hours today. CT head without contrast 10/15/2017. Report of brain MRI 04/13/2005. FINDINGS: CT Brain Perfusion Findings: ASPECTS: 10 at 2113 hours today. CBF (<30%) Volume: None Perfusion (Tmax>6.0s) volume: None hypoperfusion index not applicable Mismatch Volume: None Infarction Location:Not applicable CTA NECK Skeleton: Absent dentition. Mild for age cervical spine degeneration. No acute osseous abnormality identified. Upper chest: Upper lobe emphysema. Left chest cardiac  pacemaker type device. Other neck: Negative.  No lymphadenopathy. Aortic arch: Mild to moderate soft and calcified arch atherosclerosis. Three vessel arch configuration. Right carotid system: Minimal calcified plaque at the right ICA origin with no stenosis. Left carotid system: Soft plaque in the anterior left CCA at the level of the larynx without stenosis (series 5, image 135). Minimal plaque at the left carotid bifurcation without stenosis. Vertebral arteries: Mild if any plaque in the proximal right subclavian artery. Normal right vertebral artery origin. The right vertebral is non dominant and diminutive but patent to the skull base without stenosis. Mild calcified plaque in the proximal left subclavian artery without stenosis. Normal left vertebral artery origin. Dominant left vertebral artery is patent to the skull base. Portion of the proximal vertebral is obscured by venous contrast but no left vertebral stenosis is identified. CTA HEAD Posterior circulation: Dominant distal left vertebral artery. Normal PICA origins and vertebrobasilar junction. Patent basilar artery without stenosis. Normal SCA and right PCA origin. Fetal type left PCA origin. Right posterior communicating artery is present. Bilateral PCA branches are within normal limits. Anterior circulation: Both ICA siphons are patent. On the left there is minimal calcified plaque with no stenosis. Normal left ophthalmic and posterior communicating artery origins. On the right there is no plaque or stenosis. Normal right ophthalmic and posterior communicating artery origins. Normal MCA and ACA origins. The right A1 is dominant, the left is  diminutive. Anterior communicating artery and visible ACA branches are within normal limits. Both MCA M1 segments and bifurcations are patent without stenosis. No left or right MCA branch occlusion is identified. Asymmetric branching pattern noted, but no asymmetric paucity of branches identified. Venous sinuses:  Early contrast timing, venous sinuses not well evaluated. Anatomic variants: Dominant left and diminutive right vertebral arteries. Fetal type left PCA origin. Dominant right and diminutive left ACA A1 segments. Review of the MIP images confirms the above findings IMPRESSION: 1. Negative for large vessel occlusion, and CTP detects no core infarct or ischemic penumbra. This was discussed by telephone with Dr. Kerney Elbe on 11/19/2018 at 2150 hours. 2. Aortic Atherosclerosis (ICD10-I70.0), but mild atherosclerosis in the head and neck with no arterial stenosis identified. 3.  Emphysema (ICD10-J43.9). Electronically Signed   By: Genevie Ann M.D.   On: 11/19/2018 21:53   Dg Chest Port 1 View  Result Date: 11/19/2018 CLINICAL DATA:  Sudden onset of agitation EXAM: PORTABLE CHEST 1 VIEW COMPARISON:  06/24/2018 FINDINGS: Left-sided pacing device as before. Mild cardiomegaly. No consolidation or effusion. Aortic atherosclerosis. No pneumothorax. IMPRESSION: No active disease.  Cardiomegaly Electronically Signed   By: Donavan Foil M.D.   On: 11/19/2018 23:59   Ct Head Code Stroke Wo Contrast  Result Date: 11/19/2018 CLINICAL DATA:  Code stroke. Altered level of consciousness. Abnormal speech. EXAM: CT HEAD WITHOUT CONTRAST TECHNIQUE: Contiguous axial images were obtained from the base of the skull through the vertex without intravenous contrast. COMPARISON:  10/15/2017 FINDINGS: Brain: Enlargement of the ventricles and sulci as well as prominent extra-axial CSF spaces over both cerebral convexities and along the interhemispheric fissure in the frontal region are unchanged and compatible with age advanced cerebral atrophy. Coexistent small subdural hygromas are not excluded. No acute infarct, acute intracranial hemorrhage, mass, or midline shift is identified. Periventricular white matter hypodensities are similar to the prior study and nonspecific but compatible with mild chronic small vessel ischemic disease.  Vascular: No hyperdense vessel. Skull: No fracture or focal osseous lesion. Sinuses/Orbits: Paranasal sinuses and mastoid air cells are clear. Unremarkable orbits. Other: None. ASPECTS [facility] Stroke Program Early CT Score) - Ganglionic level infarction (caudate, lentiform nuclei, internal capsule, insula, M1-M3 cortex): 7 - Supraganglionic infarction (M4-M6 cortex): 3 Total score (0-10 with 10 being normal): 10 IMPRESSION: 1. No evidence of acute intracranial abnormality. 2. ASPECTS is 10. 3. Age advanced cerebral atrophy and mild chronic small vessel ischemic disease. These results were communicated to Dr. Cheral Marker at 9:22 pm on 11/19/2018 by text page via the [facility] messaging system. Electronically Signed   By: Logan Bores M.D.   On: 11/19/2018 21:22    Medications:    buPROPion  150 mg Oral BID   cloNIDine  0.1 mg Oral BID   enoxaparin (LOVENOX) injection  40 mg Subcutaneous Q24H   hydrALAZINE  50 mg Oral TID   metoprolol tartrate  100 mg Oral BID   mometasone-formoterol  2 puff Inhalation BID   montelukast  10 mg Oral QHS   paliperidone  6 mg Oral QHS   pantoprazole  40 mg Oral BID   senna-docusate  1 tablet Oral QHS   sucralfate  1 g Oral QID   Continuous Infusions:   LOS: 0 days   Geradine Girt  [facility]   How to contact the [facility] Attending or Consulting provider [place] or covering provider during after hours [place], for this patient?  1. Check the care team in [facility]  and look for a) attending/consulting [place] provider listed and b) the [facility] team listed 2. Log into [email or web address] and use [place]'s universal password to access. If you do not have the password, please contact the hospital operator. 3. Locate the [facility] provider you are looking for under [facility] and page to a number that you can be directly reached. 4. If you still have difficulty reaching the provider, please page the [facility] (Director on Call) for the Hospitalists listed on amion for  assistance.  11/20/2018, 10:55 AM

## 2018-11-20 NOTE — Progress Notes (Signed)
EEG complete - results pending 

## 2018-11-20 NOTE — Evaluation (Signed)
Speech Language Pathology Evaluation Patient Details Name: Peter Marsh MRN: [id number] DOB: December 20, 1960 Today's Date: 11/20/2018 Time: 4098-1191 SLP Time Calculation (min) (ACUTE ONLY): 20 min  Problem List:  Patient Active Problem List   Diagnosis Date Noted  . Encephalopathy 11/19/2018  . GERD (gastroesophageal reflux disease) 11/19/2018  . Chest pain 06/24/2018  . Essential hypertension 06/24/2018  . Wide QRS ventricular tachycardia ([place]) 02/26/2017  . 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 Coffee ground emesis 09/16/2016  . CKD (chronic kidney disease), stage III ([place]) 09/16/2016  . Schizophrenia ([place]) 09/16/2016  . Paroxysmal atrial fibrillation ([place]) 09/16/2016  . Upper GI bleed 09/16/2016  . Asthma 02/09/2016  . Dyspnea 02/09/2016  . BRBPR (bright red blood per rectum) 06/18/2013  . Herniated lumbar intervertebral disc 04/20/2011   Past Medical History:  Past Medical History:  Diagnosis Date  . Asthma    uses inhalers   . Bilateral carotid bruits   . Cardiac conduction disorder 2018   s/p MDT PPM  . Chronic kidney disease    bladder interstial cystitis   . Chronic kidney disease (CKD), stage IV (severe) ([name])    followed by Dr. Joelyn Oms at [facility]  . Depression   . GERD (gastroesophageal reflux disease)   . History of stomach ulcers 2001  . Hypertension   . LBBB (left bundle branch block)   . Lower extremity edema   . Mild intermittent asthma without complication   . Mixed hyperlipidemia   . Paroxysmal atrial flutter ([place])   . PONV (postoperative nausea and vomiting)   . Schizophrenia [facility])    Past Surgical History:  Past Surgical History:  Procedure Laterality Date  . APPENDECTOMY    . ELECTROPHYSIOLOGY STUDY N/A 02/26/2017   Procedure: ELECTROPHYSIOLOGY STUDY;  Surgeon: Evans Lance, MD;  Location: [place] [facility];  Service: Cardiovascular;  Laterality: N/A;  . ESOPHAGOGASTRODUODENOSCOPY  (EGD) WITH PROPOFOL N/A 09/18/2016   Procedure: ESOPHAGOGASTRODUODENOSCOPY (EGD) WITH PROPOFOL;  Surgeon: Ronnette Juniper, MD;  Location: [place];  Service: Gastroenterology;  Laterality: N/A;  . LUMBAR LAMINECTOMY/DECOMPRESSION MICRODISCECTOMY  04/19/2011   Procedure: LUMBAR LAMINECTOMY/DECOMPRESSION MICRODISCECTOMY;  Surgeon: Tobi Bastos;  Location: [facility];  Service: Orthopedics;  Laterality: Left;  Hemi LAminectomy/Microdiscectomy Lumbar four  - Lumbar five  on the Left (X-Ray)  . PACEMAKER IMPLANT N/A 02/26/2017   Procedure: PACEMAKER IMPLANT;  Surgeon: Evans Lance, MD;  Location: [place] [facility];  Service: Cardiovascular;  Laterality: N/A;  . TONSILLECTOMY     HPI:  Pt is a 58 y.o. male with medical history significant for conduction system disease s/p PPM not on anticoagulation, CKD stage III, hypertension, hyperlipidemia, LBBB, GERD, esophageal ulcer, asthma, and schizophrenia who presented to the ED for evaluation of agitation and aphasia. MRI of the brain was negative for acute changes.    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 He denied any baseline deficits in speech or language and indicated that his language difficulty has resolved. However, per the pt, he has been having increased memory difficulty for the past three months and he has been compensating for this by writing information down. He expressed that his father had Alzheimer's disease and that it "runs in the family". He expressed that his uncles and father either died or were diagnosed with Alzheimer's at or around 60 years old and, considering  his age, he has attributed his memory difficulty to his possibly having Alzheimer's disease. His speech and language skills are currently within normal limits and his cognitive-linguistic skills were functional but some difficulty with memory was noted. Further skilled SLP  services are not clinically indicated at this time and the pt has agreed to follow up with his PCP regarding his memory difficulty. Pt and nursing were educated regarding results and recommendations; both parties verbalized understanding as well as agreement with plan of care.    SLP Assessment  SLP Recommendation/Assessment: Patient does not need any further Speech Lanaguage Pathology Services SLP Visit Diagnosis: Cognitive communication deficit (R41.841)    Follow Up Recommendations  None    Frequency and Duration           SLP Evaluation Cognition  Overall Cognitive Status: History of cognitive impairments - at baseline Arousal/Alertness: Awake/alert Orientation Level: Oriented to place;Oriented to situation;Disoriented to time(Oriented to month, day, year not date (stated the 17th)) Attention: Focused;Sustained Focused Attention: Appears intact Sustained Attention: Appears intact Memory: Impaired Memory Impairment: Storage deficit;Retrieval deficit;Decreased recall of new information(Immediate: 3/3; delayed: 0/3; with cues: 3/3) Awareness: Appears intact Problem Solving: Appears intact       Comprehension  Auditory Comprehension Overall Auditory Comprehension: Appears within functional limits for tasks assessed Yes/No Questions: Within Functional Limits Basic Immediate Environment Questions: (5/5) Complex Questions: (4/5) Paragraph Comprehension (via yes/no questions): (3/4) Commands: Within Functional Limits Two Step Basic Commands: (4/4) Multistep Basic Commands: (4/4) Visual Recognition/Discrimination Discrimination: Within Function Limits Reading Comprehension Reading Status: Within funtional limits    Expression Expression Primary Mode of Expression: Verbal Verbal Expression Overall Verbal Expression: Appears within functional limits for tasks assessed Initiation: No impairment Automatic Speech: Counting;Day of week;Month of year(WNL) Level of  Generative/Spontaneous Verbalization: Conversation Repetition: No impairment(4/4) Naming: No impairment Responsive: (5/5) Confrontation: Within functional limits(10/10) Convergent: (Sentence completion: 5/5) Pragmatics: No impairment Written Expression Dominant Hand: Right   Oral / Motor  Oral Motor/Sensory Function Overall Oral Motor/Sensory Function: Within functional limits Motor Speech Overall Motor Speech: Appears within functional limits for tasks assessed Respiration: Within functional limits Phonation: Normal Resonance: Within functional limits Articulation: Within functional limitis Intelligibility: Intelligible Motor Speech Errors: Not applicable    Dariush Mcnellis I. Hardin Negus, [place], [place] Office number [phone] Pager [place] 11/20/2018, 3:08 PM

## 2018-11-20 NOTE — Evaluation (Signed)
Physical Therapy Evaluation Patient Details Name: Peter Marsh MRN: [id number] DOB: 18-Mar-1961 Today's Date: 11/20/2018   History of Present Illness  Patient is a 58 y/o male presenting to the ED on 11/19/2018 with AMS. Past medical history significant for conduction system disease s/p PPM not on anticoagulation, CKD stage III, hypertension, hyperlipidemia, LBBB, GERD, esophageal ulcer, asthma, and schizophrenia. CT head without contrast was negative for acute intracranial abnormality. Admitted for continued work-up.    Clinical Impression  Patient admitted with the above listed diagnosis. Patient reports independence with mobility and ADLs prior to admission. Patient today requiring min guard for safety and balance with mobility. Often tangential with speech, however A&Ox4. Will currently recommend HHPT at discharge to ensure safety with mobility. PT to follow acutely.      Follow Up Recommendations Home health PT;Supervision/Assistance - 24 hour    Equipment Recommendations  None recommended by PT    Recommendations for Other Services       Precautions / Restrictions Precautions Precautions: Fall Restrictions Weight Bearing Restrictions: No      Mobility  Bed Mobility Overal bed mobility: Modified Independent                Transfers Overall transfer level: Needs assistance Equipment used: None Transfers: Sit to/from Stand Sit to Stand: Min guard         General transfer comment: close min guard for safety and immediate standing balance  Ambulation/Gait Ambulation/Gait assistance: Min assist;Min guard Gait Distance (Feet): 30 Feet Assistive device: None Gait Pattern/deviations: Step-through pattern;Decreased stride length;Drifts right/left Gait velocity: decreased   General Gait Details: close min guard to light Min A for safety and stability; states he did not feel "normal" compared to baseline but unable to describe in what manner  Stairs            Wheelchair Mobility    Modified Rankin (Stroke Patients Only)       Balance Overall balance assessment: Mild deficits observed, not formally tested                                           Pertinent Vitals/Pain Pain Assessment: No/denies pain    Home Living Family/patient expects to be discharged to:: Private residence Living Arrangements: Spouse/significant other Available Help at Discharge: Family;Available 24 hours/day Type of Home: Apartment Home Access: Level entry     Home Layout: One level Home Equipment: Grab bars - tub/shower Additional Comments: prescription glasses    Prior Function Level of Independence: Independent               Hand Dominance        Extremity/Trunk Assessment   Upper Extremity Assessment Upper Extremity Assessment: Defer to OT evaluation    Lower Extremity Assessment Lower Extremity Assessment: Overall WFL for tasks assessed    Cervical / Trunk Assessment Cervical / Trunk Assessment: Normal  Communication   Communication: No difficulties  Cognition Arousal/Alertness: Awake/alert Behavior During Therapy: WFL for tasks assessed/performed Overall Cognitive Status: Within Functional Limits for tasks assessed                                        General Comments      Exercises     Assessment/Plan    PT Assessment Patient needs continued PT  services  PT Problem List Decreased strength;Decreased activity tolerance;Decreased balance;Decreased mobility;Decreased knowledge of use of DME;Decreased safety awareness       PT Treatment Interventions DME instruction;Gait training;Stair training;Functional mobility training;Therapeutic activities;Therapeutic exercise;Balance training;Neuromuscular re-education;Patient/family education    PT Goals (Current goals can be found in the Care Plan section)  Acute Rehab PT Goals Patient Stated Goal: get home to wife PT Goal Formulation: With  patient Time For Goal Achievement: 12/04/18 Potential to Achieve Goals: Good    Frequency Min 3X/week   Barriers to discharge        Co-evaluation PT/OT/SLP Co-Evaluation/Treatment: Yes Reason for Co-Treatment: Necessary to address cognition/behavior during functional activity;For patient/therapist safety;To address functional/ADL transfers PT goals addressed during session: Mobility/safety with mobility;Balance;Strengthening/ROM         AM-PAC PT "6 Clicks" Mobility  Outcome Measure Help needed turning from your back to your side while in a flat bed without using bedrails?: None Help needed moving from lying on your back to sitting on the side of a flat bed without using bedrails?: None Help needed moving to and from a bed to a chair (including a wheelchair)?: A Little Help needed standing up from a chair using your arms (e.g., wheelchair or bedside chair)?: A Little Help needed to walk in hospital room?: A Little Help needed climbing 3-5 steps with a railing? : A Lot 6 Click Score: 19    End of Session Equipment Utilized During Treatment: Gait belt Activity Tolerance: Patient tolerated treatment well Patient left: in chair;with call bell/phone within reach;with chair alarm set Nurse Communication: Mobility status PT Visit Diagnosis: Unsteadiness on feet (R26.81);Other abnormalities of gait and mobility (R26.89);Muscle weakness (generalized) (M62.81)    Time: 6767-2094 PT Time Calculation (min) (ACUTE ONLY): 28 min   Charges:   PT Evaluation $PT Eval Moderate Complexity: 1 Mod          Lanney Gins, PT, DPT Supplemental Physical Therapist 11/20/18 1:44 PM Pager: [phone] Office: [phone]

## 2018-11-21 MED ORDER — CLONIDINE HCL 0.2 MG PO TABS: 0.2000 mg | ORAL_TABLET | Freq: Two times a day (BID) | ORAL | 0 refills | Status: DC

## 2018-11-21 MED ORDER — CLONIDINE HCL 0.1 MG PO TABS: 0.2000 mg | ORAL_TABLET | Freq: Two times a day (BID) | ORAL | Status: DC

## 2018-11-21 NOTE — Progress Notes (Signed)
Physical Therapy Treatment Patient Details Name: Peter Marsh MRN: [id number] DOB: 02-27-1961 Today's Date: 11/21/2018    History of Present Illness Patient is a 58 y/o male presenting to the ED on 11/19/2018 with AMS. Past medical history significant for conduction system disease s/p PPM not on anticoagulation, CKD stage III, hypertension, hyperlipidemia, LBBB, GERD, esophageal ulcer, asthma, and schizophrenia. CT head without contrast was negative for acute intracranial abnormality. Admitted for continued work-up.    PT Comments    Patient received in supine - upset that he has not discharged yet. PT reassuring patient that nursing staff was actively working on this. Patient performing dynamic balance with supervision for safety. Ambulating In hallway with min guard/supervision for safety. Spoke to wife who will be available to assist as needed. Will continue to follow in the even patient does not d/c. D/c recs remain appropriate.     Follow Up Recommendations  Home health PT;Supervision/Assistance - 24 hour     Equipment Recommendations  None recommended by PT    Recommendations for Other Services       Precautions / Restrictions Precautions Precautions: Fall Restrictions Weight Bearing Restrictions: No    Mobility  Bed Mobility Overal bed mobility: Modified Independent                Transfers Overall transfer level: Needs assistance Equipment used: None Transfers: Sit to/from Stand Sit to Stand: Supervision         General transfer comment: for safety  Ambulation/Gait Ambulation/Gait assistance: Min guard Gait Distance (Feet): 120 Feet Assistive device: None Gait Pattern/deviations: Step-through pattern;Decreased stride length;Drifts right/left;Trunk flexed Gait velocity: decreased   General Gait Details: steady pace of gait; min guard for safety; no issues with obstacle navigation or turning   Stairs             Wheelchair Mobility     Modified Rankin (Stroke Patients Only)       Balance Overall balance assessment: Mild deficits observed, not formally tested                                          Cognition Arousal/Alertness: Awake/alert Behavior During Therapy: WFL for tasks assessed/performed Overall Cognitive Status: History of cognitive impairments - at baseline                                        Exercises      General Comments        Pertinent Vitals/Pain Pain Assessment: No/denies pain    Home Living                      Prior Function            PT Goals (current goals can now be found in the care plan section) Acute Rehab PT Goals Patient Stated Goal: get home to wife PT Goal Formulation: With patient Time For Goal Achievement: 12/04/18 Potential to Achieve Goals: Good Progress towards PT goals: Progressing toward goals    Frequency    Min 3X/week      PT Plan Current plan remains appropriate    Co-evaluation              AM-PAC PT "6 Clicks" Mobility   Outcome Measure  Help needed turning from your  back to your side while in a flat bed without using bedrails?: None Help needed moving from lying on your back to sitting on the side of a flat bed without using bedrails?: None Help needed moving to and from a bed to a chair (including a wheelchair)?: A Little Help needed standing up from a chair using your arms (e.g., wheelchair or bedside chair)?: A Little Help needed to walk in hospital room?: A Little Help needed climbing 3-5 steps with a railing? : A Lot 6 Click Score: 19    End of Session Equipment Utilized During Treatment: Gait belt Activity Tolerance: Patient tolerated treatment well Patient left: in chair;with call bell/phone within reach;with chair alarm set Nurse Communication: Mobility status PT Visit Diagnosis: Unsteadiness on feet (R26.81);Other abnormalities of gait and mobility (R26.89);Muscle weakness  (generalized) (M62.81)     Time: 7943-2761 PT Time Calculation (min) (ACUTE ONLY): 26 min  Charges:  $Gait Training: 8-22 mins $Therapeutic Activity: 8-22 mins                      Lanney Gins, PT, DPT Supplemental Physical Therapist 11/21/18 12:01 PM Pager: [phone] Office: [phone]

## 2018-11-21 NOTE — TOC Transition Note (Signed)
Transition of Care [facility]) - CM/SW Discharge Note   Patient Details  Name: Peter Marsh MRN: [id number] Date of Birth: 1961/01/26  [facility] of [facility] CM/SW Contact:  Geralynn Ochs, LCSW Phone Number: 11/21/2018, 11:55 AM   Clinical Narrative:   Patient discharging home, already has tub bench at home that he uses. No other needs identified.    Final next level of care: [place] Barriers to Discharge: Barriers Resolved   Patient Goals and CMS Choice Patient states their goals for this hospitalization and ongoing recovery are:: get back home CMS Medicare.gov Compare Post Acute Care list provided to:: Patient Choice offered to / list presented to : Patient  Discharge Placement                       Discharge Plan and Services     Post Acute Care Choice: Home Health                    HH Arranged: PT, OT [facility] Agency: [facility] Date [place]: 11/20/18 Time HH Agency Contacted: [id number] Representative spoke with at [place]: [place] ([place]) Interventions     Readmission Risk Interventions No flowsheet data found.

## 2018-11-21 NOTE — Discharge Summary (Signed)
Physician Discharge Summary  Peter Marsh YQM:[id number] DOB: June 15, 1960 DOA: 11/19/2018  PCP: Marda Stalker, PA-C  Admit date: 11/19/2018 Discharge date: 11/21/2018  Admitted From: Home Disposition:  Home  Discharge Condition:Stable CODE STATUS:FULL Diet recommendation: Heart Healthy  Brief/Interim Summary:  Patient is a 58 y.o. male with medical history significant forconduction system diseases/p PPMnot on anticoagulation, CKD stage III, hypertension, hyperlipidemia, LBBB, GERD,esophageal ulcer, asthma, and schizophrenia whois admitted with acute encephalopathy, agitation, speech abnormality.  Stroke was suspected on admission.  MRI of the brain did not show any stroke.  Neurology was consulted and signed off.  Currently is mental status is at baseline, alert and oriented.  He is hemodynamically stable for discharge to home today.  PT recommended home health for him.  Following problems were addressed during his hospitalization:  Acute encephalopathy: Patient presented with reported altered mental status, agitation, and speech abnormality without dysarthria.  -CTA head/neck with CTP negative for large vessel occlusion or perfusion deficit -MRI brain did not show stroke -Symptomatically improved with BP control and IV Geodon given in ED -EEG normal -PT eval pending done and recommended home health  Hypertension: Significantly hypertensive on ED arrival. Improved with IV labetalol.  -Resume home clonidine 0.1 mg twice daily, hydralazine 50 mg 3 times daily, Lopressor 100 mg twice daily .Dose of clonidine increased to 0.2 mg twice a day.  History of esophageal ulcer/upper GI bleed: Last EGD 09/18/2016 by Dr. Therisa Doyne showed esophageal ulcer without active bleeding. -Continue Protonix and Carafate  Hypokalemia: Mild, supplemented.  CKD stage III: Currently kidney function near baseline.  Check BMP in a week.  Conduction system disease status post PPM: EP study in  October 2018 suggestive of severe conduction system disease without evidence of inducible atrial flutter or atrial fibrillation. At that time it was determined that systemic anticoagulation risk is greater than benefit due to no inducible A. fib/flutter.  Asthma: Continue Singulair, Dulera, and as needed albuterol.  Depression: Continue bupropion.  Schizophrenia: Concern for acute psychosis on ED arrival. Mentation mood stabilized by the time of admission evaluation.  S/p geodon  obesity Body mass index is 30.16 kg/m.    Discharge Diagnoses:  Principal Problem:   Encephalopathy Active Problems:   Asthma   CKD (chronic kidney disease), stage III ([name])   Schizophrenia ([name])   Paroxysmal atrial fibrillation ([name])   Essential hypertension   GERD (gastroesophageal reflux disease)    Discharge Instructions  Discharge Instructions    Diet - low sodium heart healthy   Complete by: As directed    Discharge instructions   Complete by: As directed    1)Please follow up with your PCP in a week.  Do a BMP test during the follow-up. 2)Take prescribed medications as instructed.  Monitor your blood pressure at home.   Increase activity slowly   Complete by: As directed      Allergies as of 11/21/2018      Reactions   Methylpyrrolidone Hives   froze the intestine   Niacin Other (See Comments), Nausea And Vomiting   Flushing, itching, tingling    Norvasc [amlodipine Besylate] Other (See Comments)   Swollen Feet   Oxybutynin Chloride [oxybutynin Chloride Er] Other (See Comments)   froze the intestine   Vesicare [solifenacin Succinate] Other (See Comments)   Froze the intestine   Ciprofloxacin Rash, Other (See Comments)   Felt flushed   Oxybutynin Rash   Solifenacin Rash      Medication List    TAKE these medications  acetaminophen 325 MG tablet Commonly known as: TYLENOL Take 650 mg by mouth every 6 (six) hours as needed for mild pain or headache.   albuterol  108 (90 Base) MCG/ACT inhaler Commonly known as: VENTOLIN HFA Inhale 2 puffs into the lungs every 6 (six) hours as needed for wheezing or shortness of breath.   benazepril 10 MG tablet Commonly known as: LOTENSIN Take 10 mg by mouth daily.   budesonide-formoterol 80-4.5 MCG/ACT inhaler Commonly known as: Symbicort Inhale 2 puffs into the lungs 2 (two) times daily.   buPROPion 150 MG 12 hr tablet Commonly known as: WELLBUTRIN SR Take 150 mg by mouth 2 (two) times daily.   cloNIDine 0.2 MG tablet Commonly known as: Catapres Take 1 tablet (0.2 mg total) by mouth 2 (two) times daily. What changed:   medication strength  how much to take   hydrALAZINE 50 MG tablet Commonly known as: APRESOLINE Take 50 mg by mouth 3 (three) times daily.   metoprolol tartrate 100 MG tablet Commonly known as: LOPRESSOR TAKE 1 TABLET BY MOUTH TWICE DAILY   montelukast 10 MG tablet Commonly known as: SINGULAIR Take 10 mg by mouth at bedtime.   paliperidone 6 MG 24 hr tablet Commonly known as: INVEGA Take 6 mg by mouth at bedtime.   pantoprazole 40 MG tablet Commonly known as: PROTONIX Take 1 tablet (40 mg total) by mouth 2 (two) times daily.   sucralfate 1 g tablet Commonly known as: CARAFATE Take 1 g by mouth 4 (four) times daily.            Durable Medical Equipment  (From admission, onward)         Start     Ordered   11/21/18 1058  For home use only DME Tub bench  Once     11/21/18 1057   11/21/18 1058  For home use only DME Shower stool  Once     11/21/18 1057         Follow-up Information    [facility] Follow up.   Specialty: Home Health Services Why: Representative from [facility] will contact you via telephone to schedule the start of home health services. Contact information: [address] [place] [address] [phone]        Marda Stalker, PA-C. Schedule an appointment as soon as possible for a visit in 1 week(s).   Specialty:  Family Medicine Contact information: [place] [address] [phone]          Allergies  Allergen Reactions  . Methylpyrrolidone Hives    froze the intestine  . Niacin Other (See Comments) and Nausea And Vomiting    Flushing, itching, tingling   . Norvasc [Amlodipine Besylate] Other (See Comments)    Swollen Feet  . Oxybutynin Chloride [Oxybutynin Chloride Er] Other (See Comments)    froze the intestine  . Vesicare [Solifenacin Succinate] Other (See Comments)    Froze the intestine   . Ciprofloxacin Rash and Other (See Comments)    Felt flushed   . Oxybutynin Rash  . Solifenacin Rash    Consultations:  Neurology   Procedures/Studies: Ct Code Stroke Cta Head W/wo Contrast  Result Date: 11/19/2018 CLINICAL DATA:  58 year old male code stroke presentation. Abnormal speech. EXAM: CT ANGIOGRAPHY HEAD AND NECK CT PERFUSION BRAIN TECHNIQUE: Multidetector CT imaging of the head and neck was performed using the standard protocol during bolus administration of intravenous contrast. Multiplanar CT image reconstructions and MIPs were  obtained to evaluate the vascular anatomy. Carotid stenosis measurements (when applicable) are obtained utilizing NASCET criteria, using the distal internal carotid diameter as the denominator. Multiphase CT imaging of the brain was performed following IV bolus contrast injection. Subsequent parametric perfusion maps were calculated using RAPID software. CONTRAST:  165mL OMNIPAQUE IOHEXOL 350 MG/ML SOLN COMPARISON:  Head CT without contrast 2113 hours today. CT head without contrast 10/15/2017. Report of brain MRI 04/13/2005. FINDINGS: CT Brain Perfusion Findings: ASPECTS: 10 at 2113 hours today. CBF (<30%) Volume: None Perfusion (Tmax>6.0s) volume: None hypoperfusion index not applicable Mismatch Volume: None Infarction Location:Not applicable CTA NECK Skeleton: Absent dentition. Mild for age cervical spine degeneration. No acute osseous  abnormality identified. Upper chest: Upper lobe emphysema. Left chest cardiac pacemaker type device. Other neck: Negative.  No lymphadenopathy. Aortic arch: Mild to moderate soft and calcified arch atherosclerosis. Three vessel arch configuration. Right carotid system: Minimal calcified plaque at the right ICA origin with no stenosis. Left carotid system: Soft plaque in the anterior left CCA at the level of the larynx without stenosis (series 5, image 135). Minimal plaque at the left carotid bifurcation without stenosis. Vertebral arteries: Mild if any plaque in the proximal right subclavian artery. Normal right vertebral artery origin. The right vertebral is non dominant and diminutive but patent to the skull base without stenosis. Mild calcified plaque in the proximal left subclavian artery without stenosis. Normal left vertebral artery origin. Dominant left vertebral artery is patent to the skull base. Portion of the proximal vertebral is obscured by venous contrast but no left vertebral stenosis is identified. CTA HEAD Posterior circulation: Dominant distal left vertebral artery. Normal PICA origins and vertebrobasilar junction. Patent basilar artery without stenosis. Normal SCA and right PCA origin. Fetal type left PCA origin. Right posterior communicating artery is present. Bilateral PCA branches are within normal limits. Anterior circulation: Both ICA siphons are patent. On the left there is minimal calcified plaque with no stenosis. Normal left ophthalmic and posterior communicating artery origins. On the right there is no plaque or stenosis. Normal right ophthalmic and posterior communicating artery origins. Normal MCA and ACA origins. The right A1 is dominant, the left is diminutive. Anterior communicating artery and visible ACA branches are within normal limits. Both MCA M1 segments and bifurcations are patent without stenosis. No left or right MCA branch occlusion is identified. Asymmetric branching  pattern noted, but no asymmetric paucity of branches identified. Venous sinuses: Early contrast timing, venous sinuses not well evaluated. Anatomic variants: Dominant left and diminutive right vertebral arteries. Fetal type left PCA origin. Dominant right and diminutive left ACA A1 segments. Review of the MIP images confirms the above findings IMPRESSION: 1. Negative for large vessel occlusion, and CTP detects no core infarct or ischemic penumbra. This was discussed by telephone with Dr. Kerney Elbe on 11/19/2018 at 2150 hours. 2. Aortic Atherosclerosis (ICD10-I70.0), but mild atherosclerosis in the head and neck with no arterial stenosis identified. 3.  Emphysema (ICD10-J43.9). Electronically Signed   By: Genevie Ann M.D.   On: 11/19/2018 21:53   Dg Wrist Complete Right  Result Date: 11/05/2018 CLINICAL DATA:  Right wrist and hand pain secondary to a fall off of a curb. EXAM: RIGHT WRIST - COMPLETE 3+ VIEW COMPARISON:  None. FINDINGS: There is a prominent dorsal avulsion fracture of the triquetrum seen on the lateral view. No dislocation. The other bones of the wrist appear intact. IMPRESSION: Fracture of the triquetrum. Electronically Signed   By: Lorriane Shire M.D.   On:  11/05/2018 09:40   Ct Code Stroke Cta Neck W/wo Contrast  Result Date: 11/19/2018 CLINICAL DATA:  58 year old male code stroke presentation. Abnormal speech. EXAM: CT ANGIOGRAPHY HEAD AND NECK CT PERFUSION BRAIN TECHNIQUE: Multidetector CT imaging of the head and neck was performed using the standard protocol during bolus administration of intravenous contrast. Multiplanar CT image reconstructions and MIPs were obtained to evaluate the vascular anatomy. Carotid stenosis measurements (when applicable) are obtained utilizing NASCET criteria, using the distal internal carotid diameter as the denominator. Multiphase CT imaging of the brain was performed following IV bolus contrast injection. Subsequent parametric perfusion maps were calculated  using RAPID software. CONTRAST:  17mL OMNIPAQUE IOHEXOL 350 MG/ML SOLN COMPARISON:  Head CT without contrast 2113 hours today. CT head without contrast 10/15/2017. Report of brain MRI 04/13/2005. FINDINGS: CT Brain Perfusion Findings: ASPECTS: 10 at 2113 hours today. CBF (<30%) Volume: None Perfusion (Tmax>6.0s) volume: None hypoperfusion index not applicable Mismatch Volume: None Infarction Location:Not applicable CTA NECK Skeleton: Absent dentition. Mild for age cervical spine degeneration. No acute osseous abnormality identified. Upper chest: Upper lobe emphysema. Left chest cardiac pacemaker type device. Other neck: Negative.  No lymphadenopathy. Aortic arch: Mild to moderate soft and calcified arch atherosclerosis. Three vessel arch configuration. Right carotid system: Minimal calcified plaque at the right ICA origin with no stenosis. Left carotid system: Soft plaque in the anterior left CCA at the level of the larynx without stenosis (series 5, image 135). Minimal plaque at the left carotid bifurcation without stenosis. Vertebral arteries: Mild if any plaque in the proximal right subclavian artery. Normal right vertebral artery origin. The right vertebral is non dominant and diminutive but patent to the skull base without stenosis. Mild calcified plaque in the proximal left subclavian artery without stenosis. Normal left vertebral artery origin. Dominant left vertebral artery is patent to the skull base. Portion of the proximal vertebral is obscured by venous contrast but no left vertebral stenosis is identified. CTA HEAD Posterior circulation: Dominant distal left vertebral artery. Normal PICA origins and vertebrobasilar junction. Patent basilar artery without stenosis. Normal SCA and right PCA origin. Fetal type left PCA origin. Right posterior communicating artery is present. Bilateral PCA branches are within normal limits. Anterior circulation: Both ICA siphons are patent. On the left there is minimal  calcified plaque with no stenosis. Normal left ophthalmic and posterior communicating artery origins. On the right there is no plaque or stenosis. Normal right ophthalmic and posterior communicating artery origins. Normal MCA and ACA origins. The right A1 is dominant, the left is diminutive. Anterior communicating artery and visible ACA branches are within normal limits. Both MCA M1 segments and bifurcations are patent without stenosis. No left or right MCA branch occlusion is identified. Asymmetric branching pattern noted, but no asymmetric paucity of branches identified. Venous sinuses: Early contrast timing, venous sinuses not well evaluated. Anatomic variants: Dominant left and diminutive right vertebral arteries. Fetal type left PCA origin. Dominant right and diminutive left ACA A1 segments. Review of the MIP images confirms the above findings IMPRESSION: 1. Negative for large vessel occlusion, and CTP detects no core infarct or ischemic penumbra. This was discussed by telephone with Dr. Kerney Elbe on 11/19/2018 at 2150 hours. 2. Aortic Atherosclerosis (ICD10-I70.0), but mild atherosclerosis in the head and neck with no arterial stenosis identified. 3.  Emphysema (ICD10-J43.9). Electronically Signed   By: Genevie Ann M.D.   On: 11/19/2018 21:53   Mr Brain Wo Contrast  Result Date: 11/20/2018 CLINICAL DATA:  Acute presentation with altered  mental status and speech disturbance. EXAM: MRI HEAD WITHOUT CONTRAST TECHNIQUE: Multiplanar, multiecho pulse sequences of the brain and surrounding structures were obtained without intravenous contrast. COMPARISON:  Multiple CT studies done yesterday. FINDINGS: Brain: Diffusion imaging does not show any acute or subacute infarction. There are mild chronic small-vessel ischemic changes of the pons. No focal cerebellar insult. Cerebral hemispheres show generalized atrophy with frontal and temporal predominance. There are mild chronic small-vessel ischemic changes of the deep  white matter. No cortical or large vessel territory infarction. No mass lesion, hemorrhage, hydrocephalus or extra-axial collection. Vascular: Major vessels at the base of the brain show flow. Skull and upper cervical spine: Negative Sinuses/Orbits: Clear/normal Other: None IMPRESSION: No acute or reversible finding. Advanced generalized brain atrophy with frontal and temporal predominance. Mild chronic small-vessel ischemic change of the cerebral hemispheric white matter Electronically Signed   By: Nelson Chimes M.D.   On: 11/20/2018 13:36   Ct Code Stroke Cta Cerebral Perfusion W/wo Contrast  Result Date: 11/19/2018 CLINICAL DATA:  58 year old male code stroke presentation. Abnormal speech. EXAM: CT ANGIOGRAPHY HEAD AND NECK CT PERFUSION BRAIN TECHNIQUE: Multidetector CT imaging of the head and neck was performed using the standard protocol during bolus administration of intravenous contrast. Multiplanar CT image reconstructions and MIPs were obtained to evaluate the vascular anatomy. Carotid stenosis measurements (when applicable) are obtained utilizing NASCET criteria, using the distal internal carotid diameter as the denominator. Multiphase CT imaging of the brain was performed following IV bolus contrast injection. Subsequent parametric perfusion maps were calculated using RAPID software. CONTRAST:  165mL OMNIPAQUE IOHEXOL 350 MG/ML SOLN COMPARISON:  Head CT without contrast 2113 hours today. CT head without contrast 10/15/2017. Report of brain MRI 04/13/2005. FINDINGS: CT Brain Perfusion Findings: ASPECTS: 10 at 2113 hours today. CBF (<30%) Volume: None Perfusion (Tmax>6.0s) volume: None hypoperfusion index not applicable Mismatch Volume: None Infarction Location:Not applicable CTA NECK Skeleton: Absent dentition. Mild for age cervical spine degeneration. No acute osseous abnormality identified. Upper chest: Upper lobe emphysema. Left chest cardiac pacemaker type device. Other neck: Negative.  No  lymphadenopathy. Aortic arch: Mild to moderate soft and calcified arch atherosclerosis. Three vessel arch configuration. Right carotid system: Minimal calcified plaque at the right ICA origin with no stenosis. Left carotid system: Soft plaque in the anterior left CCA at the level of the larynx without stenosis (series 5, image 135). Minimal plaque at the left carotid bifurcation without stenosis. Vertebral arteries: Mild if any plaque in the proximal right subclavian artery. Normal right vertebral artery origin. The right vertebral is non dominant and diminutive but patent to the skull base without stenosis. Mild calcified plaque in the proximal left subclavian artery without stenosis. Normal left vertebral artery origin. Dominant left vertebral artery is patent to the skull base. Portion of the proximal vertebral is obscured by venous contrast but no left vertebral stenosis is identified. CTA HEAD Posterior circulation: Dominant distal left vertebral artery. Normal PICA origins and vertebrobasilar junction. Patent basilar artery without stenosis. Normal SCA and right PCA origin. Fetal type left PCA origin. Right posterior communicating artery is present. Bilateral PCA branches are within normal limits. Anterior circulation: Both ICA siphons are patent. On the left there is minimal calcified plaque with no stenosis. Normal left ophthalmic and posterior communicating artery origins. On the right there is no plaque or stenosis. Normal right ophthalmic and posterior communicating artery origins. Normal MCA and ACA origins. The right A1 is dominant, the left is diminutive. Anterior communicating artery and visible ACA branches are  within normal limits. Both MCA M1 segments and bifurcations are patent without stenosis. No left or right MCA branch occlusion is identified. Asymmetric branching pattern noted, but no asymmetric paucity of branches identified. Venous sinuses: Early contrast timing, venous sinuses not well  evaluated. Anatomic variants: Dominant left and diminutive right vertebral arteries. Fetal type left PCA origin. Dominant right and diminutive left ACA A1 segments. Review of the MIP images confirms the above findings IMPRESSION: 1. Negative for large vessel occlusion, and CTP detects no core infarct or ischemic penumbra. This was discussed by telephone with Dr. Kerney Elbe on 11/19/2018 at 2150 hours. 2. Aortic Atherosclerosis (ICD10-I70.0), but mild atherosclerosis in the head and neck with no arterial stenosis identified. 3.  Emphysema (ICD10-J43.9). Electronically Signed   By: Genevie Ann M.D.   On: 11/19/2018 21:53   Dg Chest Port 1 View  Result Date: 11/19/2018 CLINICAL DATA:  Sudden onset of agitation EXAM: PORTABLE CHEST 1 VIEW COMPARISON:  06/24/2018 FINDINGS: Left-sided pacing device as before. Mild cardiomegaly. No consolidation or effusion. Aortic atherosclerosis. No pneumothorax. IMPRESSION: No active disease.  Cardiomegaly Electronically Signed   By: Donavan Foil M.D.   On: 11/19/2018 23:59   Dg Hand Complete Right  Result Date: 11/05/2018 CLINICAL DATA:  Right hand pain secondary to a fall off of a curb. EXAM: RIGHT HAND - COMPLETE 3+ VIEW COMPARISON:  None. FINDINGS: There are avulsion fractures from the dorsal aspect of the triquetrum. The other bones of the hand and wrist are intact. IMPRESSION: Fracture of the triquetrum. Electronically Signed   By: Lorriane Shire M.D.   On: 11/05/2018 09:41   Ct Head Code Stroke Wo Contrast  Result Date: 11/19/2018 CLINICAL DATA:  Code stroke. Altered level of consciousness. Abnormal speech. EXAM: CT HEAD WITHOUT CONTRAST TECHNIQUE: Contiguous axial images were obtained from the base of the skull through the vertex without intravenous contrast. COMPARISON:  10/15/2017 FINDINGS: Brain: Enlargement of the ventricles and sulci as well as prominent extra-axial CSF spaces over both cerebral convexities and along the interhemispheric fissure in the frontal  region are unchanged and compatible with age advanced cerebral atrophy. Coexistent small subdural hygromas are not excluded. No acute infarct, acute intracranial hemorrhage, mass, or midline shift is identified. Periventricular white matter hypodensities are similar to the prior study and nonspecific but compatible with mild chronic small vessel ischemic disease. Vascular: No hyperdense vessel. Skull: No fracture or focal osseous lesion. Sinuses/Orbits: Paranasal sinuses and mastoid air cells are clear. Unremarkable orbits. Other: None. ASPECTS [facility] Stroke Program Early CT Score) - Ganglionic level infarction (caudate, lentiform nuclei, internal capsule, insula, M1-M3 cortex): 7 - Supraganglionic infarction (M4-M6 cortex): 3 Total score (0-10 with 10 being normal): 10 IMPRESSION: 1. No evidence of acute intracranial abnormality. 2. ASPECTS is 10. 3. Age advanced cerebral atrophy and mild chronic small vessel ischemic disease. These results were communicated to Dr. Cheral Marker at 9:22 pm on 11/19/2018 by text page via the [facility] messaging system. Electronically Signed   By: Logan Bores M.D.   On: 11/19/2018 21:22       Subjective:  Patient seen and examined the bedside this morning.  Currently hemodynamically stable, mildly hypertensive.  His mental status is at baseline.  Alert and oriented.  Stable for discharge to home.  Discharge Exam: Vitals:   11/21/18 0414 11/21/18 0900  BP: (!) 162/84 (!) 172/90  Pulse: (!) 59 (!) 59  Resp: 15 19  Temp: 98 F (36.7 C) 98.6 F (37 C)  SpO2: 95% 96%  Vitals:   11/21/18 0100 11/21/18 0200 11/21/18 0414 11/21/18 0900  BP:   (!) 162/84 (!) 172/90  Pulse: (!) 59 (!) 59 (!) 59 (!) 59  Resp: 18 11 15 19   Temp:   98 F (36.7 C) 98.6 F (37 C)  TempSrc:   Oral Axillary  SpO2: 95% 96% 95% 96%  Weight:      Height:        General: Pt is alert, awake, not in acute distress Cardiovascular: RRR, S1/S2 +, no rubs, no gallops Respiratory: CTA bilaterally,  no wheezing, no rhonchi Abdominal: Soft, NT, ND, bowel sounds + Extremities: no edema, no cyanosis    The results of significant diagnostics from this hospitalization (including imaging, microbiology, ancillary and laboratory) are listed below for reference.     Microbiology: Recent Results (from the past 240 hour(s))  SARS Coronavirus 2 (CEPHEID - Performed in [place] hospital lab), Hosp Order     Status: None   Collection Time: 11/20/18 12:01 AM   Specimen: Nasopharyngeal Swab  Result Value Ref Range Status   SARS Coronavirus 2 NEGATIVE NEGATIVE Final    Comment: (NOTE) If result is NEGATIVE SARS-CoV-2 target nucleic acids are NOT DETECTED. The SARS-CoV-2 RNA is generally detectable in upper and lower  respiratory specimens during the acute phase of infection. The lowest  concentration of SARS-CoV-2 viral copies this assay can detect is 250  copies / mL. A negative result does not preclude SARS-CoV-2 infection  and should not be used as the sole basis for treatment or other  patient management decisions.  A negative result may occur with  improper specimen collection / handling, submission of specimen other  than nasopharyngeal swab, presence of viral mutation(s) within the  areas targeted by this assay, and inadequate number of viral copies  (<250 copies / mL). A negative result must be combined with clinical  observations, patient history, and epidemiological information. If result is POSITIVE SARS-CoV-2 target nucleic acids are DETECTED. The SARS-CoV-2 RNA is generally detectable in upper and lower  respiratory specimens dur ing the acute phase of infection.  Positive  results are indicative of active infection with SARS-CoV-2.  Clinical  correlation with patient history and other diagnostic information is  necessary to determine patient infection status.  Positive results do  not rule out bacterial infection or co-infection with other viruses. If result is PRESUMPTIVE  POSTIVE SARS-CoV-2 nucleic acids MAY BE PRESENT.   A presumptive positive result was obtained on the submitted specimen  and confirmed on repeat testing.  While 2019 novel coronavirus  (SARS-CoV-2) nucleic acids may be present in the submitted sample  additional confirmatory testing may be necessary for epidemiological  and / or clinical management purposes  to differentiate between  SARS-CoV-2 and other Sarbecovirus currently known to infect humans.  If clinically indicated additional testing with an alternate test  methodology [phone]) is advised. The SARS-CoV-2 RNA is generally  detectable in upper and lower respiratory sp ecimens during the acute  phase of infection. The expected result is Negative. Fact Sheet for Patients:  StrictlyIdeas.no Fact Sheet for Healthcare Providers: BankingDealers.co.za This test is not yet approved or cleared by the [place] FDA and has been authorized for detection and/or diagnosis of SARS-CoV-2 by FDA under an Emergency Use Authorization (EUA).  This EUA will remain in effect (meaning this test can be used) for the duration of the COVID-19 declaration under Section 564(b)(1) of the Act, 21 U.S.C. section 360bbb-3(b)(1), unless the authorization is terminated or  revoked sooner. Performed at [facility], [facility] [address] [place], [place] [address]      Labs: BNP (last 3 results) Recent Labs    06/24/18 0826  BNP 25.8   Basic Metabolic Panel: Recent Labs  Lab 11/19/18 2107 11/19/18 2112 11/20/18 0432  NA 141 142 142  K 3.4* 3.4* 3.7  CL 108 107 111  CO2 21*  --  21*  GLUCOSE 103* 102* 110*  BUN 15 17 14   CREATININE 2.08* 2.00* 1.85*  CALCIUM 9.7  --  8.8*   Liver Function Tests: Recent Labs  Lab 11/19/18 2107  AST 22  ALT 17  ALKPHOS 59  BILITOT 1.2  PROT 7.2  ALBUMIN 4.1   No results for input(s): LIPASE, AMYLASE in the last 168 hours. No results for input(s):  AMMONIA in the last 168 hours. CBC: Recent Labs  Lab 11/19/18 2107 11/19/18 2112 11/20/18 0432  WBC 8.6  --  6.6  NEUTROABS 4.9  --   --   HGB 14.5 15.0 13.0  HCT 45.7 44.0 42.4  MCV 87.9  --  89.6  PLT 234  --  201   Cardiac Enzymes: No results for input(s): CKTOTAL, CKMB, CKMBINDEX, TROPONINI in the last 168 hours. BNP: Invalid input(s): POCBNP CBG: No results for input(s): GLUCAP in the last 168 hours. D-Dimer No results for input(s): DDIMER in the last 72 hours. Hgb A1c Recent Labs    11/20/18 0432  HGBA1C 5.7*   Lipid Profile Recent Labs    11/20/18 0432  CHOL 169  HDL 25*  LDLCALC 125*  TRIG 96  CHOLHDL 6.8   Thyroid function studies No results for input(s): TSH, T4TOTAL, T3FREE, THYROIDAB in the last 72 hours.  Invalid input(s): FREET3 Anemia work up No results for input(s): VITAMINB12, FOLATE, FERRITIN, TIBC, IRON, RETICCTPCT in the last 72 hours. Urinalysis    Component Value Date/Time   COLORURINE YELLOW 11/20/2018 1400   APPEARANCEUR CLEAR 11/20/2018 1400   LABSPEC >1.046 (H) 11/20/2018 1400   PHURINE 5.0 11/20/2018 1400   GLUCOSEU NEGATIVE 11/20/2018 1400   HGBUR NEGATIVE 11/20/2018 1400   BILIRUBINUR NEGATIVE 11/20/2018 1400   KETONESUR NEGATIVE 11/20/2018 1400   PROTEINUR 100 (A) 11/20/2018 1400   UROBILINOGEN 0.2 04/17/2011 1030   NITRITE NEGATIVE 11/20/2018 1400   LEUKOCYTESUR NEGATIVE 11/20/2018 1400   Sepsis Labs Invalid input(s): PROCALCITONIN,  WBC,  LACTICIDVEN Microbiology Recent Results (from the past 240 hour(s))  SARS Coronavirus 2 (CEPHEID - Performed in [place] hospital lab), Hosp Order     Status: None   Collection Time: 11/20/18 12:01 AM   Specimen: Nasopharyngeal Swab  Result Value Ref Range Status   SARS Coronavirus 2 NEGATIVE NEGATIVE Final    Comment: (NOTE) If result is NEGATIVE SARS-CoV-2 target nucleic acids are NOT DETECTED. The SARS-CoV-2 RNA is generally detectable in upper and lower  respiratory  specimens during the acute phase of infection. The lowest  concentration of SARS-CoV-2 viral copies this assay can detect is 250  copies / mL. A negative result does not preclude SARS-CoV-2 infection  and should not be used as the sole basis for treatment or other  patient management decisions.  A negative result may occur with  improper specimen collection / handling, submission of specimen other  than nasopharyngeal swab, presence of viral mutation(s) within the  areas targeted by this assay, and inadequate number of viral copies  (<250 copies / mL). A negative result must be combined with clinical  observations, patient  history, and epidemiological information. If result is POSITIVE SARS-CoV-2 target nucleic acids are DETECTED. The SARS-CoV-2 RNA is generally detectable in upper and lower  respiratory specimens dur ing the acute phase of infection.  Positive  results are indicative of active infection with SARS-CoV-2.  Clinical  correlation with patient history and other diagnostic information is  necessary to determine patient infection status.  Positive results do  not rule out bacterial infection or co-infection with other viruses. If result is PRESUMPTIVE POSTIVE SARS-CoV-2 nucleic acids MAY BE PRESENT.   A presumptive positive result was obtained on the submitted specimen  and confirmed on repeat testing.  While 2019 novel coronavirus  (SARS-CoV-2) nucleic acids may be present in the submitted sample  additional confirmatory testing may be necessary for epidemiological  and / or clinical management purposes  to differentiate between  SARS-CoV-2 and other Sarbecovirus currently known to infect humans.  If clinically indicated additional testing with an alternate test  methodology [phone]) is advised. The SARS-CoV-2 RNA is generally  detectable in upper and lower respiratory sp ecimens during the acute  phase of infection. The expected result is Negative. Fact Sheet for  Patients:  StrictlyIdeas.no Fact Sheet for Healthcare Providers: BankingDealers.co.za This test is not yet approved or cleared by the [place] FDA and has been authorized for detection and/or diagnosis of SARS-CoV-2 by FDA under an Emergency Use Authorization (EUA).  This EUA will remain in effect (meaning this test can be used) for the duration of the COVID-19 declaration under Section 564(b)(1) of the Act, 21 U.S.C. section 360bbb-3(b)(1), unless the authorization is terminated or revoked sooner. Performed at [facility], [place] [address] [place], [place] [address]     Please note: You were cared for by a hospitalist during your hospital stay. Once you are discharged, your primary care physician will handle any further medical issues. Please note that NO REFILLS for any discharge medications will be authorized once you are discharged, as it is imperative that you return to your primary care physician (or establish a relationship with a primary care physician if you do not have one) for your post hospital discharge needs so that they can reassess your need for medications and monitor your lab values.    Time coordinating discharge: 40 minutes  SIGNED:   Shelly Coss, MD  [facility] 11/21/2018, 11:00 AM Pager [phone]  If 7PM-7AM, please contact night-coverage [email or web address] Password [email or web address]

## 2018-11-21 NOTE — TOC Initial Note (Signed)
Transition of Care [facility]) - Initial/Assessment Note    Patient Details  Name: Peter Marsh MRN: [id number] Date of Birth: October 07, 1960  [facility] of [facility] CM/SW Contact:    Geralynn Ochs, LCSW Phone Number: 11/21/2018, 10:20 AM  Clinical Narrative:   CSW met with patient to discuss discharge plans and recommendations for home health. Patient agreeable to home health. Patient set up with [facility]. Has a rolling walker at home and spouse available to assist 24/7.                Expected Discharge Plan: [place] Barriers to Discharge: Continued Medical Work up   Patient Goals and CMS Choice Patient states their goals for this hospitalization and ongoing recovery are:: get back home CMS Medicare.gov Compare Post Acute Care list provided to:: Patient Choice offered to / list presented to : Patient  Expected Discharge Plan and Services Expected Discharge Plan: [place] Choice: [place] arrangements for the past 2 months: Single Family Home                           HH Arranged: PT, OT HH Agency: [facility] Date [facility] Agency Contacted: 11/20/18 Time HH Agency Contacted: [phone] Representative spoke with at [place]: Dorian Pod  Prior Living Arrangements/Services Living arrangements for the past 2 months: [place] Lives with:: Spouse Patient language and need for interpreter reviewed:: No Do you feel safe going back to the place where you live?: Yes      Need for Family Participation in Patient Care: No (Comment) Care giver support system in place?: Yes (comment) Current home services: DME Criminal Activity/Legal Involvement Pertinent to Current Situation/Hospitalization: No - Comment as needed  Activities of Daily Living Home Assistive Devices/Equipment: None ADL Screening (condition at time of admission) Patient's cognitive ability adequate to safely complete daily activities?:  Yes Is the patient deaf or have difficulty hearing?: No Does the patient have difficulty seeing, even when wearing glasses/contacts?: No Does the patient have difficulty concentrating, remembering, or making decisions?: No Patient able to express need for assistance with ADLs?: Yes Does the patient have difficulty dressing or bathing?: No Independently performs ADLs?: Yes (appropriate for developmental age) Does the patient have difficulty walking or climbing stairs?: Yes Weakness of Legs: Both Weakness of Arms/Hands: None  Permission Sought/Granted                  Emotional Assessment Appearance:: Appears stated age Attitude/Demeanor/Rapport: Engaged Affect (typically observed): Pleasant Orientation: : Oriented to Self, Oriented to Place, Oriented to  Time, Oriented to Situation Alcohol / Substance Use: Not Applicable Psych Involvement: No (comment)  Admission diagnosis:  Encephalopathy [G93.40] Psychosis, unspecified psychosis type ([place]) [V95] Acute metabolic encephalopathy [G38.75] Patient Active Problem List   Diagnosis Date Noted  . Encephalopathy 11/19/2018  . GERD (gastroesophageal reflux disease) 11/19/2018  . Chest pain 06/24/2018  . Essential hypertension 06/24/2018  . Wide QRS ventricular tachycardia ([place]) 02/26/2017  . 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 Coffee ground emesis 09/16/2016  . CKD (chronic kidney disease), stage III ([place]) 09/16/2016  . Schizophrenia ([place]) 09/16/2016  . Paroxysmal atrial fibrillation ([place]) 09/16/2016  . Upper GI bleed 09/16/2016  . Asthma 02/09/2016  . Dyspnea 02/09/2016  . BRBPR (bright red blood per rectum) 06/18/2013  .  Herniated lumbar intervertebral disc 04/20/2011   PCP:  Marda Stalker, PA-C Pharmacy:   [facility] [place], [place] AT [address] [place] [address] Phone: [phone]  Fax: [phone]     Social Determinants of Health (SDOH) Interventions    Readmission Risk Interventions No flowsheet data found.

## 2018-11-21 NOTE — Plan of Care (Signed)
Adequate for discharge.

## 2018-12-05 DIAGNOSIS — D631 Anemia in chronic kidney disease: Secondary | ICD-10-CM | POA: Diagnosis not present

## 2018-12-05 DIAGNOSIS — N183 Chronic kidney disease, stage 3 (moderate): Secondary | ICD-10-CM | POA: Diagnosis not present

## 2018-12-05 DIAGNOSIS — I129 Hypertensive chronic kidney disease with stage 1 through stage 4 chronic kidney disease, or unspecified chronic kidney disease: Secondary | ICD-10-CM | POA: Diagnosis not present

## 2018-12-05 DIAGNOSIS — F209 Schizophrenia, unspecified: Secondary | ICD-10-CM | POA: Diagnosis not present

## 2018-12-05 DIAGNOSIS — N1330 Unspecified hydronephrosis: Secondary | ICD-10-CM | POA: Diagnosis not present

## 2018-12-05 DIAGNOSIS — N281 Cyst of kidney, acquired: Secondary | ICD-10-CM | POA: Diagnosis not present

## 2018-12-10 ENCOUNTER — Other Ambulatory Visit: Payer: Self-pay | Admitting: Nephrology

## 2018-12-10 DIAGNOSIS — N1832 Chronic kidney disease, stage 3b: Secondary | ICD-10-CM

## 2018-12-10 DIAGNOSIS — N281 Cyst of kidney, acquired: Secondary | ICD-10-CM

## 2018-12-25 ENCOUNTER — Ambulatory Visit
Admission: RE | Admit: 2018-12-25 | Discharge: 2018-12-25 | Disposition: A | Discharge status: Home / Self Care | Payer: PPO | Source: Ambulatory Visit | Attending: Nephrology | Admitting: Nephrology

## 2018-12-25 DIAGNOSIS — N1832 Chronic kidney disease, stage 3b: Secondary | ICD-10-CM

## 2018-12-25 DIAGNOSIS — N189 Chronic kidney disease, unspecified: Secondary | ICD-10-CM | POA: Diagnosis not present

## 2018-12-25 DIAGNOSIS — N281 Cyst of kidney, acquired: Secondary | ICD-10-CM

## 2018-12-31 ENCOUNTER — Other Ambulatory Visit: Payer: PPO

## 2019-01-10 ENCOUNTER — Other Ambulatory Visit (HOSPITAL_COMMUNITY): Payer: Self-pay | Admitting: Urology

## 2019-01-10 DIAGNOSIS — N131 Hydronephrosis with ureteral stricture, not elsewhere classified: Secondary | ICD-10-CM | POA: Diagnosis not present

## 2019-01-10 DIAGNOSIS — N270 Small kidney, unilateral: Secondary | ICD-10-CM | POA: Diagnosis not present

## 2019-01-10 DIAGNOSIS — D49512 Neoplasm of unspecified behavior of left kidney: Secondary | ICD-10-CM | POA: Diagnosis not present

## 2019-01-10 DIAGNOSIS — N281 Cyst of kidney, acquired: Secondary | ICD-10-CM | POA: Diagnosis not present

## 2019-01-17 ENCOUNTER — Encounter (HOSPITAL_COMMUNITY)
Admission: RE | Admit: 2019-01-17 | Discharge: 2019-01-17 | Disposition: A | Discharge status: Home / Self Care | Payer: PPO | Source: Ambulatory Visit | Attending: Urology | Admitting: Urology

## 2019-01-17 ENCOUNTER — Other Ambulatory Visit: Payer: Self-pay

## 2019-01-17 DIAGNOSIS — N131 Hydronephrosis with ureteral stricture, not elsewhere classified: Secondary | ICD-10-CM | POA: Insufficient documentation

## 2019-01-17 DIAGNOSIS — N1330 Unspecified hydronephrosis: Secondary | ICD-10-CM | POA: Diagnosis not present

## 2019-01-17 MED ORDER — FUROSEMIDE 10 MG/ML IJ SOLN
INTRAMUSCULAR | Status: AC
  Administered 2019-01-17: 52 mg via INTRAVENOUS
  Filled 2019-01-17: qty 8

## 2019-01-17 MED ORDER — TECHNETIUM TC 99M MERTIATIDE
5.4000 | Freq: Once | INTRAVENOUS | Status: AC
  Administered 2019-01-17: 5.4 via INTRAVENOUS

## 2019-01-17 MED ORDER — FUROSEMIDE 10 MG/ML IJ SOLN
52.0000 mg | Freq: Once | INTRAMUSCULAR | Status: AC
  Administered 2019-01-17: 10:00:00 52 mg via INTRAVENOUS

## 2019-02-03 ENCOUNTER — Other Ambulatory Visit: Payer: Self-pay

## 2019-02-03 DIAGNOSIS — N189 Chronic kidney disease, unspecified: Secondary | ICD-10-CM | POA: Diagnosis not present

## 2019-02-03 DIAGNOSIS — I129 Hypertensive chronic kidney disease with stage 1 through stage 4 chronic kidney disease, or unspecified chronic kidney disease: Secondary | ICD-10-CM | POA: Diagnosis not present

## 2019-02-03 DIAGNOSIS — J449 Chronic obstructive pulmonary disease, unspecified: Secondary | ICD-10-CM | POA: Diagnosis not present

## 2019-02-03 DIAGNOSIS — I10 Essential (primary) hypertension: Secondary | ICD-10-CM | POA: Diagnosis not present

## 2019-02-03 MED ORDER — METOPROLOL TARTRATE 100 MG PO TABS: 100.0000 mg | ORAL_TABLET | Freq: Two times a day (BID) | ORAL | 0 refills | Status: DC

## 2019-02-10 ENCOUNTER — Other Ambulatory Visit: Payer: Self-pay

## 2019-02-10 MED ORDER — BENAZEPRIL HCL 10 MG PO TABS: 10.0000 mg | ORAL_TABLET | Freq: Every day | ORAL | 1 refills | Status: DC

## 2019-02-10 MED ORDER — HYDRALAZINE HCL 50 MG PO TABS: 50.0000 mg | ORAL_TABLET | Freq: Three times a day (TID) | ORAL | 2 refills | Status: DC

## 2019-02-11 DIAGNOSIS — I4439 Other atrioventricular block: Secondary | ICD-10-CM | POA: Diagnosis not present

## 2019-02-11 DIAGNOSIS — Z45018 Encounter for adjustment and management of other part of cardiac pacemaker: Secondary | ICD-10-CM | POA: Diagnosis not present

## 2019-02-11 DIAGNOSIS — Z950 Presence of cardiac pacemaker: Secondary | ICD-10-CM | POA: Diagnosis not present

## 2019-02-13 ENCOUNTER — Encounter: Payer: Self-pay | Admitting: Cardiology

## 2019-02-13 DIAGNOSIS — I495 Sick sinus syndrome: Secondary | ICD-10-CM

## 2019-02-13 DIAGNOSIS — Z45018 Encounter for adjustment and management of other part of cardiac pacemaker: Secondary | ICD-10-CM

## 2019-02-13 HISTORY — DX: Encounter for adjustment and management of other part of cardiac pacemaker: Z45.018

## 2019-02-13 HISTORY — DX: Sick sinus syndrome: I49.5

## 2019-02-27 ENCOUNTER — Other Ambulatory Visit: Payer: PPO

## 2019-03-05 ENCOUNTER — Ambulatory Visit: Payer: PPO | Admitting: Cardiology

## 2019-03-05 DIAGNOSIS — I10 Essential (primary) hypertension: Secondary | ICD-10-CM | POA: Diagnosis not present

## 2019-03-05 DIAGNOSIS — I129 Hypertensive chronic kidney disease with stage 1 through stage 4 chronic kidney disease, or unspecified chronic kidney disease: Secondary | ICD-10-CM | POA: Diagnosis not present

## 2019-03-05 DIAGNOSIS — N189 Chronic kidney disease, unspecified: Secondary | ICD-10-CM | POA: Diagnosis not present

## 2019-03-05 DIAGNOSIS — J449 Chronic obstructive pulmonary disease, unspecified: Secondary | ICD-10-CM | POA: Diagnosis not present

## 2019-03-18 DIAGNOSIS — I4892 Unspecified atrial flutter: Secondary | ICD-10-CM | POA: Insufficient documentation

## 2019-03-18 NOTE — Progress Notes (Signed)
Primary Physician/Referring:  Marda Stalker, PA-C  Patient ID: Peter Marsh, male    DOB: 09-27-1960, 58 y.o.   MRN: [id number]  Chief Complaint  Patient presents with  . Chest Pain    f/u  . Hypertension   HPI:    Peter Marsh  is a 58 y.o. Caucasian male with  h/o schizophrenia, hypertension, chronic stage 3 CKD, bronchial asthma, h/o GI bleed in may of 2018 while in the hospital had atrial flutter with 2:1 conduction with RVR, again during a negative nuclear stress test in Nov 2017. He has had recurrent episodes of syncope and underwent EP evaluation and there was no inducible arrhythmias however due to high grade conduction system disease and a permanent Medtronic pacemaker was implanted using His bundle on 02/26/2017 by Dr. Crissie Sickles and anticoagulation was discontinued.  He was admitted to the hospital on 11/19/2018 and discharged 3 days later with acute mental status changes and agitation felt to be due to hypertensive urgency, neurologic workup including CT of head and neck and MRI of the brain did not show any stroke or significant vascular disease.  Clonidine dose was increased to 0.2 mg b.i.d.  He now presents here to me for one year follow-up.  In the interim he was also found to have high-grade right hydronephrosis on 12/25/2018, underwent laser procedure (details not available) and told to have resolution of hydronephrosis Mayme Genta, MD).  He is here on an annual visit and also c/o chest pain.  On questioning states he hurts "all over" and all the times. He sometimes wakes up with chest pain and abdominal pain. No specifics to his chest pain and no relation to exertion. Also has chronic dyspnea on exertion which he states is stable. Denies claudication. States he is taking all his meds and as he was feeling "fuzzy", as per his wife's instruction, did not take BP medications today.   Past Medical History:  Diagnosis Date  . Asthma    uses inhalers   .  Bilateral carotid bruits   . Cardiac conduction disorder 2018   s/p MDT PPM  . Chronic kidney disease    bladder interstial cystitis   . Chronic kidney disease (CKD), stage IV (severe) ([name])    followed by Dr. Joelyn Oms at [facility]  . Depression   . Encounter for care of pacemaker 02/13/2019  . GERD (gastroesophageal reflux disease)   . History of stomach ulcers 2001  . Hypertension   . LBBB (left bundle branch block)   . Lower extremity edema   . Mild intermittent asthma without complication   . Mixed hyperlipidemia   . Pacemaker: Medtronic Azure XT DR MRI [id number] PPM -  BUNDLE OF HIS pacing  02/26/2017   Scheduled Remote pacemaker check  11/12/2018:  There were 24 Fast AV episodes:  EGMs show SVTs. Episodes lasted < 2 minutes. Health trends do not demonstrate significant abnormality. Battery longevity is 9.4 - 10.3 years. RA pacing is 47.1 %, RV pacing is 40.4 %.  Clinic check 11/07/17.   . Paroxysmal atrial flutter ([place])   . PONV (postoperative nausea and vomiting)   . Schizophrenia ([place])   . Sinus node dysfunction ([place]) 02/13/2019   Past Surgical History:  Procedure Laterality Date  . APPENDECTOMY    . ELECTROPHYSIOLOGY STUDY N/A 02/26/2017   Procedure: ELECTROPHYSIOLOGY STUDY;  Surgeon: Evans Lance, MD;  Location: [place] [facility];  Service: Cardiovascular;  Laterality: N/A;  . ESOPHAGOGASTRODUODENOSCOPY (EGD) WITH PROPOFOL N/A  09/18/2016   Procedure: ESOPHAGOGASTRODUODENOSCOPY (EGD) WITH PROPOFOL;  Surgeon: Ronnette Juniper, MD;  Location: [place];  Service: Gastroenterology;  Laterality: N/A;  . LUMBAR LAMINECTOMY/DECOMPRESSION MICRODISCECTOMY  04/19/2011   Procedure: LUMBAR LAMINECTOMY/DECOMPRESSION MICRODISCECTOMY;  Surgeon: Tobi Bastos;  Location: [facility];  Service: Orthopedics;  Laterality: Left;  Hemi LAminectomy/Microdiscectomy Lumbar four  - Lumbar five  on the Left (X-Ray)  . PACEMAKER IMPLANT N/A 02/26/2017   Procedure: PACEMAKER IMPLANT;  Surgeon: Evans Lance, MD;  Location: [place];  Service: Cardiovascular;  Laterality: N/A;  . TONSILLECTOMY     Social History   Socioeconomic History  . Marital status: Married    Spouse name: Not on file  . Number of children: 1  . Years of education: Not on file  . Highest education level: Not on file  Occupational History  . Not on file  Social Needs  . Financial resource strain: Not on file  . Food insecurity    Worry: Not on file    Inability: Not on file  . Transportation needs    Medical: Not on file    Non-medical: Not on file  Tobacco Use  . Smoking status: Former Smoker    Packs/day: 1.00    Years: 30.00    Pack years: 30.00    Quit date: 05/08/2008    Years since quitting: 10.8  . Smokeless tobacco: Never Used  Substance and Sexual Activity  . Alcohol use: No  . Drug use: No  . Sexual activity: Never  Lifestyle  . Physical activity    Days per week: Not on file    Minutes per session: Not on file  . Stress: Not on file  Relationships  . Social Herbalist on phone: Not on file    Gets together: Not on file    Attends religious service: Not on file    Active member of club or organization: Not on file    Attends meetings of clubs or organizations: Not on file    Relationship status: Not on file  . Intimate partner violence    Fear of current or ex partner: Not on file    Emotionally abused: Not on file    Physically abused: Not on file    Forced sexual activity: Not on file  Other Topics Concern  . Not on file  Social History Narrative  . Not on file   ROS  Review of Systems  Constitution: Negative for chills, decreased appetite, malaise/fatigue and weight gain.  Cardiovascular: Positive for chest pain and dyspnea on exertion. Negative for leg swelling and syncope.  Endocrine: Negative for cold intolerance.  Hematologic/Lymphatic: Does not bruise/bleed easily.  Musculoskeletal: Negative for joint swelling.  Gastrointestinal: Positive for  abdominal pain. Negative for anorexia, change in bowel habit, hematochezia and melena.  Neurological: Negative for headaches and light-headedness.  Psychiatric/Behavioral: Positive for depression. Negative for substance abuse. The patient has insomnia.   All other systems reviewed and are negative.  Objective   Vitals with BMI 03/19/2019 11/21/2018 11/21/2018  Height 6\' 1"  - -  Weight 234 lbs - -  BMI 27.06 - -  Systolic 237 628 315  Diastolic 176 90 84  Pulse 64 59 59    Physical Exam  Constitutional:  He is well-built and mildly obese in no acute distress.  HENT:  Head: Atraumatic.  Eyes: Conjunctivae are normal.  Neck: Neck supple. No JVD present. No thyromegaly present.  Cardiovascular: Normal rate and regular  rhythm. Exam reveals no gallop.  No murmur heard. Pulses:      Carotid pulses are on the right side with bruit and on the left side with bruit.      Popliteal pulses are 2+ on the right side and 2+ on the left side.       Dorsalis pedis pulses are 2+ on the right side and 2+ on the left side.       Posterior tibial pulses are 0 on the right side and 0 on the left side.  S1 is normal,paradoxically split S2,  No leg edema, no JVD.  Pulmonary/Chest: Effort normal and breath sounds normal.  Abdominal: Soft. Bowel sounds are normal.  Musculoskeletal: Normal range of motion.  Neurological: He is alert.  Skin: Skin is warm and dry.  Psychiatric: He has a normal mood and affect.   Laboratory examination:    Recent Labs    06/25/18 0354 11/19/18 2107 11/19/18 2112 11/20/18 0432  NA 136 141 142 142  K 4.9 3.4* 3.4* 3.7  CL 108 108 107 111  CO2 21* 21*  --  21*  GLUCOSE 108* 103* 102* 110*  BUN 20 15 17 14   CREATININE 1.91* 2.08* 2.00* 1.85*  CALCIUM 8.8* 9.7  --  8.8*  GFRNONAA 38* 34*  --  39*  GFRAA 43* 39*  --  45*   CrCl cannot be calculated (Patient's most recent lab result is older than the maximum 21 days allowed.).  CMP Latest Ref Rng & Units 11/20/2018  11/19/2018 11/19/2018  Glucose 70 - 99 mg/dL 110(H) 102(H) 103(H)  BUN 6 - 20 mg/dL 14 17 15   Creatinine 0.61 - 1.24 mg/dL 1.85(H) 2.00(H) 2.08(H)  Sodium 135 - 145 mmol/L 142 142 141  Potassium 3.5 - 5.1 mmol/L 3.7 3.4(L) 3.4(L)  Chloride 98 - 111 mmol/L 111 107 108  CO2 22 - 32 mmol/L 21(L) - 21(L)  Calcium 8.9 - 10.3 mg/dL 8.8(L) - 9.7  Total Protein 6.5 - 8.1 g/dL - - 7.2  Total Bilirubin 0.3 - 1.2 mg/dL - - 1.2  Alkaline Phos 38 - 126 U/L - - 59  AST 15 - 41 U/L - - 22  ALT 0 - 44 U/L - - 17   CBC Latest Ref Rng & Units 11/20/2018 11/19/2018 11/19/2018  WBC 4.0 - 10.5 K/uL 6.6 - 8.6  Hemoglobin 13.0 - 17.0 g/dL 13.0 15.0 14.5  Hematocrit 39.0 - 52.0 % 42.4 44.0 45.7  Platelets 150 - 400 K/uL 201 - 234   Lipid Panel     Component Value Date/Time   CHOL 169 11/20/2018 0432   TRIG 96 11/20/2018 0432   HDL 25 (L) 11/20/2018 0432   CHOLHDL 6.8 11/20/2018 0432   VLDL 19 11/20/2018 0432   LDLCALC 125 (H) 11/20/2018 0432   HEMOGLOBIN A1C Lab Results  Component Value Date   HGBA1C 5.7 (H) 11/20/2018   MPG 116.89 11/20/2018   TSH Recent Labs    06/24/18 0924  TSH 1.673   Medications and allergies   Allergies  Allergen Reactions  . Methylpyrrolidone Hives    froze the intestine  . Niacin Other (See Comments) and Nausea And Vomiting    Flushing, itching, tingling   . Norvasc [Amlodipine Besylate] Other (See Comments)    Swollen Feet  . Oxybutynin Chloride [Oxybutynin Chloride Er] Other (See Comments)    froze the intestine  . Vesicare [Solifenacin Succinate] Other (See Comments)    Froze the intestine   . Ciprofloxacin Rash  and Other (See Comments)    Felt flushed   . Oxybutynin Rash  . Solifenacin Rash     Current Outpatient Medications  Medication Instructions  . acetaminophen (TYLENOL) 650 mg, Oral, Every 6 hours PRN  . albuterol (PROVENTIL HFA;VENTOLIN HFA) 108 (90 BASE) MCG/ACT inhaler 2 puffs, Inhalation, Every 6 hours PRN  . benazepril (LOTENSIN) 10 mg,  Oral, Daily  . budesonide-formoterol (SYMBICORT) 80-4.5 MCG/ACT inhaler 2 puffs, Inhalation, 2 times daily  . buPROPion (WELLBUTRIN SR) 150 mg, Oral, 2 times daily  . cloNIDine (CATAPRES) 0.2 mg, Oral, 2 times daily  . hydrALAZINE (APRESOLINE) 50 mg, Oral, 3 times daily  . metoprolol tartrate (LOPRESSOR) 100 mg, Oral, 2 times daily  . montelukast (SINGULAIR) 10 mg, Oral, Daily at bedtime  . paliperidone (INVEGA) 6 mg, Oral, Daily at bedtime  . pantoprazole (PROTONIX) 40 mg, Oral, 2 times daily  . sucralfate (CARAFATE) 1 g, Oral, 4 times daily    Radiology:  No results found. Cardiac Studies:   Echocardiogram [03/28/2016]: 1. Left ventricle cavity is normal in size. Mild concentric hypertrophy of the left ventricle. Borderline normal LV syst. function. Visual EF is approx.50%. Abnormal septal wall motion due to left bundle branch block. Doppler evidence of grade I (impaired) diastolic dysfunction. 2. Mild (Grade I) mitral regurgitation. 3. Mild tricuspid regurgitation. No evidence of pulmonary Hypertension.   Lexiscan sestamibi stress test 03/13/2016: 1. The resting electrocardiogram demonstrated sinus tachycardia/Atrial tachycaardia 152/min with underlying LBBB. Stress EKG is non-diagnostic for ischemia as it a pharmacologic stress using Lexiscan. Patient developed wide-complex tachycardia, what appears to be probably A. Flutter with 2:1 conduction at the rate of 156 bpm with Lexiscan infusion without additional ST-T wave changes of ischemia. 2. The perfusion imaging studies demonstrate anterior and anteroseptal wall thickening without demonstrable ischemia or scar. LVEF calculated by QGS was 52%. This is a low risk study.  Carotid Doppler [03/28/2016]: No hemodynamically significant arterial disease in the internal carotid artery bilaterally. Carotid vessels are tortuous and may be the source of bruit. No significant plaque burden. Antegrade vertebral artery flow.  Assessment      ICD-10-CM   1. Dyspnea on exertion  R06.00   2. Resistant hypertension  I10 EKG 12-Lead  3. Second degree Mobitz II AV block  I44.1   4. LBBB (left bundle branch block)  I44.7   5. Pacemaker: Medtronic Azure XT DR MRI [id number]- PPM -  BUNDLE OF HIS pacing   Z95.0     EKG 03/19/2019: Normal sinus rhythm at rate of 61 bpm, left bundle branch block.  No further analysis. No significant change from prior EKG.  Scheduled Remote pacemaker check  10.6.20:  There were 4 SVTs detected lasting up to 66min 55 secs in duration with avg A/V rates up to 162bpm. Health trends do not demonstrate significant abnormality. Battery longevity is 8.8-9.7 years. RA pacing is 59.6 %, RV pacing is 52.3%.  Recommendations:  No orders of the defined types were placed in this encounter.   Kendra Opitz with h/o schizophrenia, hypertension, chronic stage 3-4 CKD, bronchial asthma, h/o GI bleed in May of 2018 and also had atrial flutter at that time, patient also has paroxysmal atrial fibrillation. He has permanent pacemaker implantation done on 02/26/2017 for high degree AV block and recurrent syncopey Dr. Cristopher Peru and anticoagulation was discontinued.  Patient continues to have elevated blood pressure but he did not take his medications today, but notices elevated blood pressure even at home.  He has not had renal  artery duplex, we will set it up.  I also added isosorbide dinitrate 30 mg p.o. t.i.d.  Patient appears to be compliant with his medications in spite of his schizophrenia he has been losing weight and is trying his best to take care of himself.  The diffuse generalized aches and pains that he complaints of does not seem to be anginal equivalent.  He has had a nuclear stress test that is negative recently. With regards to pacemaker, functioning normally and not pacemaker dependant.  His physical examination does reveal peripheral arterial disease with markedly reduced pedal pulses but he has no symptoms of  claudication, chest pain symptoms do not appear to be anginal.  He is on appropriate medical therapy, lipids are not well controlled I will discuss this on his next office visit as I do not want to over burden him with multiple medications in the office underlying mental issues as well.  I want to see him back in one month.  Adrian Prows, MD, [facility] 03/19/2019, 11:16 AM [facility]. [place] Pager: [phone] Office: [phone] If no answer Cell [phone]

## 2019-03-19 ENCOUNTER — Telehealth (HOSPITAL_COMMUNITY): Payer: Self-pay | Admitting: *Deleted

## 2019-03-19 ENCOUNTER — Telehealth: Payer: Self-pay

## 2019-03-19 ENCOUNTER — Encounter: Payer: Self-pay | Admitting: Cardiology

## 2019-03-19 ENCOUNTER — Ambulatory Visit (INDEPENDENT_AMBULATORY_CARE_PROVIDER_SITE_OTHER): Payer: PPO | Admitting: Cardiology

## 2019-03-19 ENCOUNTER — Other Ambulatory Visit: Payer: Self-pay | Admitting: Cardiology

## 2019-03-19 ENCOUNTER — Other Ambulatory Visit: Payer: Self-pay

## 2019-03-19 VITALS — BP 170/100 | HR 64 | Temp 96.8°F | Ht 73.0 in | Wt 234.0 lb

## 2019-03-19 DIAGNOSIS — R0600 Dyspnea, unspecified: Secondary | ICD-10-CM

## 2019-03-19 DIAGNOSIS — Z950 Presence of cardiac pacemaker: Secondary | ICD-10-CM

## 2019-03-19 DIAGNOSIS — I447 Left bundle-branch block, unspecified: Secondary | ICD-10-CM

## 2019-03-19 DIAGNOSIS — I1A0 Resistant hypertension: Secondary | ICD-10-CM

## 2019-03-19 DIAGNOSIS — I10 Essential (primary) hypertension: Secondary | ICD-10-CM

## 2019-03-19 DIAGNOSIS — R0609 Other forms of dyspnea: Secondary | ICD-10-CM

## 2019-03-19 DIAGNOSIS — I441 Atrioventricular block, second degree: Secondary | ICD-10-CM

## 2019-03-19 MED ORDER — ISOSORBIDE DINITRATE 30 MG PO TABS: 30.0000 mg | ORAL_TABLET | Freq: Four times a day (QID) | ORAL | 3 refills | Status: DC

## 2019-03-19 NOTE — Telephone Encounter (Signed)
Attempting to reach pt to schedule exam requested by Dr. Einar Gip

## 2019-03-24 ENCOUNTER — Telehealth (HOSPITAL_COMMUNITY): Payer: Self-pay

## 2019-03-24 NOTE — Telephone Encounter (Signed)

## 2019-03-25 ENCOUNTER — Inpatient Hospital Stay (HOSPITAL_COMMUNITY): Admission: RE | Admit: 2019-03-25 | Payer: PPO | Source: Ambulatory Visit

## 2019-04-06 ENCOUNTER — Encounter: Payer: Self-pay | Admitting: Cardiology

## 2019-04-06 DIAGNOSIS — I441 Atrioventricular block, second degree: Secondary | ICD-10-CM

## 2019-04-06 HISTORY — DX: Atrioventricular block, second degree: I44.1

## 2019-04-09 DIAGNOSIS — N1330 Unspecified hydronephrosis: Secondary | ICD-10-CM | POA: Diagnosis not present

## 2019-04-09 DIAGNOSIS — I129 Hypertensive chronic kidney disease with stage 1 through stage 4 chronic kidney disease, or unspecified chronic kidney disease: Secondary | ICD-10-CM | POA: Diagnosis not present

## 2019-04-09 DIAGNOSIS — I10 Essential (primary) hypertension: Secondary | ICD-10-CM | POA: Diagnosis not present

## 2019-04-09 DIAGNOSIS — N1832 Chronic kidney disease, stage 3b: Secondary | ICD-10-CM | POA: Diagnosis not present

## 2019-04-11 ENCOUNTER — Other Ambulatory Visit: Payer: Self-pay

## 2019-04-11 ENCOUNTER — Ambulatory Visit (HOSPITAL_COMMUNITY)
Admission: RE | Admit: 2019-04-11 | Discharge: 2019-04-11 | Disposition: A | Discharge status: Home / Self Care | Payer: PPO | Source: Ambulatory Visit | Attending: Cardiology | Admitting: Cardiology

## 2019-04-11 DIAGNOSIS — I10 Essential (primary) hypertension: Secondary | ICD-10-CM

## 2019-04-15 ENCOUNTER — Other Ambulatory Visit: Payer: Self-pay

## 2019-04-15 DIAGNOSIS — I129 Hypertensive chronic kidney disease with stage 1 through stage 4 chronic kidney disease, or unspecified chronic kidney disease: Secondary | ICD-10-CM | POA: Diagnosis not present

## 2019-04-15 DIAGNOSIS — J449 Chronic obstructive pulmonary disease, unspecified: Secondary | ICD-10-CM | POA: Diagnosis not present

## 2019-04-15 DIAGNOSIS — N189 Chronic kidney disease, unspecified: Secondary | ICD-10-CM | POA: Diagnosis not present

## 2019-04-15 DIAGNOSIS — I10 Essential (primary) hypertension: Secondary | ICD-10-CM | POA: Diagnosis not present

## 2019-04-15 MED ORDER — CLONIDINE HCL 0.2 MG PO TABS: 0.2000 mg | ORAL_TABLET | Freq: Two times a day (BID) | ORAL | 2 refills | Status: DC

## 2019-04-15 MED ORDER — BENAZEPRIL HCL 10 MG PO TABS: 10.0000 mg | ORAL_TABLET | Freq: Every day | ORAL | 1 refills | Status: DC

## 2019-04-16 ENCOUNTER — Ambulatory Visit: Payer: PPO | Admitting: Cardiology

## 2019-04-18 ENCOUNTER — Ambulatory Visit: Payer: PPO | Admitting: Cardiology

## 2019-04-18 ENCOUNTER — Telehealth: Payer: Self-pay

## 2019-04-18 NOTE — Telephone Encounter (Signed)
Patient was scheduled to have ICD check today 04/18/19 but is very sick and will call back to r/s

## 2019-04-21 ENCOUNTER — Telehealth: Payer: Self-pay

## 2019-04-21 NOTE — Telephone Encounter (Signed)
Pt called stating that he received results from [facility] US on the portal but didn't really understand. He just read something about a blockage. Is this something that can wait until his upcoming appt on 12/23 or please advise. He wants to speak with you.//ah

## 2019-04-21 NOTE — Telephone Encounter (Signed)
Pt aware.//ah

## 2019-04-21 NOTE — Telephone Encounter (Signed)
Safe to wait

## 2019-04-23 ENCOUNTER — Other Ambulatory Visit: Payer: Self-pay

## 2019-04-23 ENCOUNTER — Emergency Department (HOSPITAL_COMMUNITY)
Admission: EM | Admit: 2019-04-23 | Discharge: 2019-04-23 | Discharge status: Against Medical Advice | Payer: PPO | Attending: Emergency Medicine | Admitting: Emergency Medicine

## 2019-04-23 ENCOUNTER — Encounter (HOSPITAL_COMMUNITY): Payer: Self-pay | Admitting: Emergency Medicine

## 2019-04-23 ENCOUNTER — Emergency Department (HOSPITAL_COMMUNITY)
Admission: EM | Admit: 2019-04-23 | Discharge: 2019-04-23 | Disposition: A | Discharge status: Home / Self Care | Payer: PPO | Source: Home / Self Care | Attending: Emergency Medicine | Admitting: Emergency Medicine

## 2019-04-23 DIAGNOSIS — R197 Diarrhea, unspecified: Secondary | ICD-10-CM | POA: Insufficient documentation

## 2019-04-23 DIAGNOSIS — Z5321 Procedure and treatment not carried out due to patient leaving prior to being seen by health care provider: Secondary | ICD-10-CM | POA: Diagnosis not present

## 2019-04-23 DIAGNOSIS — R112 Nausea with vomiting, unspecified: Secondary | ICD-10-CM

## 2019-04-23 DIAGNOSIS — K921 Melena: Secondary | ICD-10-CM | POA: Diagnosis present

## 2019-04-23 DIAGNOSIS — I129 Hypertensive chronic kidney disease with stage 1 through stage 4 chronic kidney disease, or unspecified chronic kidney disease: Secondary | ICD-10-CM | POA: Insufficient documentation

## 2019-04-23 DIAGNOSIS — Z87891 Personal history of nicotine dependence: Secondary | ICD-10-CM | POA: Insufficient documentation

## 2019-04-23 DIAGNOSIS — N184 Chronic kidney disease, stage 4 (severe): Secondary | ICD-10-CM | POA: Insufficient documentation

## 2019-04-23 DIAGNOSIS — Z951 Presence of aortocoronary bypass graft: Secondary | ICD-10-CM | POA: Insufficient documentation

## 2019-04-23 DIAGNOSIS — Z79899 Other long term (current) drug therapy: Secondary | ICD-10-CM | POA: Insufficient documentation

## 2019-04-23 LAB — CBC WITH DIFFERENTIAL/PLATELET
Abs Immature Granulocytes: 0.08 10*3/uL — ABNORMAL HIGH (ref 0.00–0.07)
Basophils Absolute: 0.1 10*3/uL (ref 0.0–0.1)
Basophils Relative: 1 %
Eosinophils Absolute: 1.2 10*3/uL — ABNORMAL HIGH (ref 0.0–0.5)
Eosinophils Relative: 12 %
HCT: 45.1 % (ref 39.0–52.0)
Hemoglobin: 14.5 g/dL (ref 13.0–17.0)
Immature Granulocytes: 1 %
Lymphocytes Relative: 20 %
Lymphs Abs: 2 10*3/uL (ref 0.7–4.0)
MCH: 28.8 pg (ref 26.0–34.0)
MCHC: 32.2 g/dL (ref 30.0–36.0)
MCV: 89.5 fL (ref 80.0–100.0)
Monocytes Absolute: 0.9 10*3/uL (ref 0.1–1.0)
Monocytes Relative: 9 %
Neutro Abs: 5.8 10*3/uL (ref 1.7–7.7)
Neutrophils Relative %: 57 %
Platelets: 259 10*3/uL (ref 150–400)
RBC: 5.04 MIL/uL (ref 4.22–5.81)
RDW: 12.5 % (ref 11.5–15.5)
WBC: 10 10*3/uL (ref 4.0–10.5)
nRBC: 0 % (ref 0.0–0.2)

## 2019-04-23 LAB — CBC
HCT: 43.2 % (ref 39.0–52.0)
Hemoglobin: 13.7 g/dL (ref 13.0–17.0)
MCH: 28.9 pg (ref 26.0–34.0)
MCHC: 31.7 g/dL (ref 30.0–36.0)
MCV: 91.1 fL (ref 80.0–100.0)
Platelets: 239 10*3/uL (ref 150–400)
RBC: 4.74 MIL/uL (ref 4.22–5.81)
RDW: 12.5 % (ref 11.5–15.5)
WBC: 10.3 10*3/uL (ref 4.0–10.5)
nRBC: 0 % (ref 0.0–0.2)

## 2019-04-23 LAB — COMPREHENSIVE METABOLIC PANEL
ALT: 14 U/L (ref 0–44)
AST: 18 U/L (ref 15–41)
Albumin: 4 g/dL (ref 3.5–5.0)
Alkaline Phosphatase: 72 U/L (ref 38–126)
Anion gap: 7 (ref 5–15)
BUN: 20 mg/dL (ref 6–20)
CO2: 28 mmol/L (ref 22–32)
Calcium: 9.3 mg/dL (ref 8.9–10.3)
Chloride: 104 mmol/L (ref 98–111)
Creatinine, Ser: 2.38 mg/dL — ABNORMAL HIGH (ref 0.61–1.24)
GFR calc Af Amer: 33 mL/min — ABNORMAL LOW (ref >60)
GFR calc non Af Amer: 29 mL/min — ABNORMAL LOW (ref >60)
Glucose, Bld: 100 mg/dL — ABNORMAL HIGH (ref 70–99)
Potassium: 4.5 mmol/L (ref 3.5–5.1)
Sodium: 139 mmol/L (ref 135–145)
Total Bilirubin: 0.4 mg/dL (ref 0.3–1.2)
Total Protein: 7.2 g/dL (ref 6.5–8.1)

## 2019-04-23 LAB — TYPE AND SCREEN
ABO/RH(D): A NEG
Antibody Screen: NEGATIVE

## 2019-04-23 LAB — BASIC METABOLIC PANEL
Anion gap: 10 (ref 5–15)
BUN: 21 mg/dL — ABNORMAL HIGH (ref 6–20)
CO2: 25 mmol/L (ref 22–32)
Calcium: 9.3 mg/dL (ref 8.9–10.3)
Chloride: 102 mmol/L (ref 98–111)
Creatinine, Ser: 2.32 mg/dL — ABNORMAL HIGH (ref 0.61–1.24)
GFR calc Af Amer: 34 mL/min — ABNORMAL LOW (ref >60)
GFR calc non Af Amer: 29 mL/min — ABNORMAL LOW (ref >60)
Glucose, Bld: 91 mg/dL (ref 70–99)
Potassium: 4.7 mmol/L (ref 3.5–5.1)
Sodium: 137 mmol/L (ref 135–145)

## 2019-04-23 LAB — HEPATIC FUNCTION PANEL
ALT: 13 U/L (ref 0–44)
AST: 19 U/L (ref 15–41)
Albumin: 4 g/dL (ref 3.5–5.0)
Alkaline Phosphatase: 71 U/L (ref 38–126)
Bilirubin, Direct: <0.1 mg/dL (ref 0.0–0.2)
Total Bilirubin: 0.9 mg/dL (ref 0.3–1.2)
Total Protein: 7.6 g/dL (ref 6.5–8.1)

## 2019-04-23 LAB — POC OCCULT BLOOD, ED: Fecal Occult Bld: NEGATIVE

## 2019-04-23 LAB — TROPONIN I (HIGH SENSITIVITY): Troponin I (High Sensitivity): 13 ng/L (ref <18)

## 2019-04-23 MED ORDER — SUCRALFATE 1 GM/10ML PO SUSP: 1.0000 g | Freq: Three times a day (TID) | ORAL | 0 refills | Status: DC

## 2019-04-23 MED ORDER — PANTOPRAZOLE SODIUM 40 MG PO TBEC: 40.0000 mg | DELAYED_RELEASE_TABLET | Freq: Every day | ORAL | 0 refills | Status: DC

## 2019-04-23 NOTE — ED Notes (Signed)
Pt stated he was leaving. 

## 2019-04-23 NOTE — ED Provider Notes (Addendum)
[facility] EMERGENCY DEPARTMENT Provider Note   CSN: [id number] Arrival date & time: 04/23/19  5277     History Chief Complaint  Patient presents with  . Emesis  . GI Bleeding    Peter Marsh is a 58 y.o. male with medical history significant for conduction system disease s/p PPM not on anticoagulation, CKD stage III, hypertension, hyperlipidemia, LBBB, GERD, esophageal ulcer, asthma, and schizophrenia presents to emergency department today with chief complaint of emesis and diarrhea x 3 days. He is endorsing dark black loose stool and dark red emesis. He has had 4 episodes of diarrhea and emesis in the last 24 hours. He has not tried any medications for his symptoms prior to arrival. He denies any alcohol consumption.  Also denies fever, chills, congestion, shortness of breath, chest pain, abdominal pain, lower extremity edema.  He presented to the ED earlier this morning at 1am and ultimately left after having labs drawn because he did not want to wait any longer. Chart review shows hemoglobin of 13.7.  He is followed by [facility]. He had upper endoscopy in 2018 that showed esophageal ulcers and biopsies were taken from gastric antrum for H. Pylori.   Past Medical History:  Diagnosis Date  . Asthma    uses inhalers   . Bilateral carotid bruits   . Cardiac conduction disorder 2018   s/p MDT PPM  . Chronic kidney disease    bladder interstial cystitis   . Chronic kidney disease (CKD), stage IV (severe) ([name])    followed by Dr. Joelyn Oms at [facility]  . Depression   . Encounter for care of pacemaker 02/13/2019  . GERD (gastroesophageal reflux disease)   . History of stomach ulcers 2001  . Hypertension   . LBBB (left bundle branch block)   . Lower extremity edema   . Mild intermittent asthma without complication   . Mixed hyperlipidemia   . Mobitz type 2 second degree AV block 04/06/2019  . Pacemaker: Medtronic Azure XT DR MRI [id number] PPM -  BUNDLE OF  HIS pacing  02/26/2017   Scheduled Remote pacemaker check  11/12/2018:  There were 24 Fast AV episodes:  EGMs show SVTs. Episodes lasted < 2 minutes. Health trends do not demonstrate significant abnormality. Battery longevity is 9.4 - 10.3 years. RA pacing is 47.1 %, RV pacing is 40.4 %.  Clinic check 11/07/17.   . Paroxysmal atrial flutter ([place])   . PONV (postoperative nausea and vomiting)   . Schizophrenia ([place])   . Sinus node dysfunction ([place]) 02/13/2019    Patient Active Problem List   Diagnosis Date Noted  . Mobitz type 2 second degree AV block 04/06/2019  . Encounter for care of pacemaker 02/13/2019  . Sinus node dysfunction ([employer]) 02/13/2019  . Encephalopathy 11/19/2018  . GERD (gastroesophageal reflux disease) 11/19/2018  . Chest pain 06/24/2018  . Essential hypertension 06/24/2018  . Wide QRS ventricular tachycardia ([place]) 02/26/2017  . 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 Coffee ground emesis 09/16/2016  . CKD (chronic kidney disease), stage III ([place]) 09/16/2016  . Schizophrenia ([place]) 09/16/2016  . Upper GI bleed 09/16/2016  . Asthma 02/09/2016  . Dyspnea 02/09/2016  . BRBPR (bright red blood per rectum) 06/18/2013  . Herniated lumbar intervertebral disc 04/20/2011    Past Surgical History:  Procedure Laterality Date  . APPENDECTOMY    . ELECTROPHYSIOLOGY STUDY N/A 02/26/2017   Procedure: ELECTROPHYSIOLOGY STUDY;  Surgeon: Evans Lance, MD;  Location: [place] [facility];  Service: Cardiovascular;  Laterality: N/A;  . ESOPHAGOGASTRODUODENOSCOPY (EGD) WITH PROPOFOL N/A 09/18/2016   Procedure: ESOPHAGOGASTRODUODENOSCOPY (EGD) WITH PROPOFOL;  Surgeon: Ronnette Juniper, MD;  Location: [place];  Service: Gastroenterology;  Laterality: N/A;  . LUMBAR LAMINECTOMY/DECOMPRESSION MICRODISCECTOMY  04/19/2011   Procedure: LUMBAR LAMINECTOMY/DECOMPRESSION MICRODISCECTOMY;  Surgeon: Tobi Bastos;  Location: [facility];  Service:  Orthopedics;  Laterality: Left;  Hemi LAminectomy/Microdiscectomy Lumbar four  - Lumbar five  on the Left (X-Ray)  . PACEMAKER IMPLANT N/A 02/26/2017   Procedure: PACEMAKER IMPLANT;  Surgeon: Evans Lance, MD;  Location: [place] [facility];  Service: Cardiovascular;  Laterality: N/A;  . TONSILLECTOMY         Family History  Problem Relation Age of Onset  . High blood pressure Mother   . Alzheimer's disease Father     Social History   Tobacco Use  . Smoking status: Former Smoker    Packs/day: 1.00    Years: 30.00    Pack years: 30.00    Quit date: 05/08/2008    Years since quitting: 10.9  . Smokeless tobacco: Never Used  Substance Use Topics  . Alcohol use: No  . Drug use: No    Home Medications Prior to Admission medications   Medication Sig Start Date End Date Taking? Authorizing Provider  acetaminophen (TYLENOL) 325 MG tablet Take 650 mg by mouth every 6 (six) hours as needed for mild pain or headache.    [provider]  albuterol (PROVENTIL HFA;VENTOLIN HFA) 108 (90 BASE) MCG/ACT inhaler Inhale 2 puffs into the lungs every 6 (six) hours as needed for wheezing or shortness of breath.     [provider]  benazepril (LOTENSIN) 10 MG tablet Take 1 tablet (10 mg total) by mouth daily. 04/15/19   Adrian Prows, MD  budesonide-formoterol (SYMBICORT) 80-4.5 MCG/ACT inhaler Inhale 2 puffs into the lungs 2 (two) times daily. 02/09/16   Tanda Rockers, MD  buPROPion (WELLBUTRIN SR) 150 MG 12 hr tablet Take 150 mg by mouth 2 (two) times daily.     [provider]  cloNIDine (CATAPRES) 0.2 MG tablet Take 1 tablet (0.2 mg total) by mouth 2 (two) times daily. 04/15/19 05/15/19  Adrian Prows, MD  hydrALAZINE (APRESOLINE) 50 MG tablet Take 1 tablet (50 mg total) by mouth 3 (three) times daily. 02/10/19   Adrian Prows, MD  isosorbide dinitrate (ISORDIL) 30 MG tablet TAKE 1 TABLET(30 MG) BY MOUTH FOUR TIMES DAILY 03/19/19   Adrian Prows, MD  metoprolol tartrate (LOPRESSOR) 100  MG tablet Take 1 tablet (100 mg total) by mouth 2 (two) times daily. 02/03/19   Adrian Prows, MD  montelukast (SINGULAIR) 10 MG tablet Take 10 mg by mouth at bedtime. 01/17/16   [provider]  paliperidone (INVEGA) 6 MG 24 hr tablet Take 6 mg by mouth at bedtime.    [provider]  pantoprazole (PROTONIX) 40 MG tablet Take 1 tablet (40 mg total) by mouth daily. 04/23/19 05/23/19  Amany Rando E, PA-C  sucralfate (CARAFATE) 1 GM/10ML suspension Take 10 mLs (1 g total) by mouth 4 (four) times daily -  with meals and at bedtime. 04/23/19   Nevaeh Korte E, PA-C    Allergies    Methylpyrrolidone, Niacin, Norvasc [amlodipine besylate], Oxybutynin chloride [oxybutynin chloride er], Vesicare [solifenacin succinate], Ciprofloxacin, Oxybutynin, and Solifenacin  Review of Systems   Review of Systems  Physical Exam Updated Vital Signs BP (!) 193/102 (BP  Location: Right Arm)   Pulse 68   Temp 98.3 F (36.8 C) (Oral)   Resp 16   Ht 6' (1.829 m)   Wt 104.3 kg   SpO2 97%   BMI 31.19 kg/m   Physical Exam Vitals and nursing note reviewed.  Constitutional:      General: He is not in acute distress.    Appearance: He is not ill-appearing.  HENT:     Head: Normocephalic and atraumatic.     Right Ear: Tympanic membrane and external ear normal.     Left Ear: Tympanic membrane and external ear normal.     Nose: Nose normal.     Mouth/Throat:     Mouth: Mucous membranes are moist.     Pharynx: Oropharynx is clear.  Eyes:     General: No scleral icterus.       Right eye: No discharge.        Left eye: No discharge.     Extraocular Movements: Extraocular movements intact.     Conjunctiva/sclera: Conjunctivae normal.     Pupils: Pupils are equal, round, and reactive to light.  Neck:     Vascular: No JVD.  Cardiovascular:     Rate and Rhythm: Normal rate and regular rhythm.     Pulses: Normal pulses.          Radial pulses are 2+ on the right side and 2+ on the left  side.     Heart sounds: Normal heart sounds.  Pulmonary:     Comments: Lungs clear to auscultation in all fields. Symmetric chest rise. No wheezing, rales, or rhonchi. Abdominal:     Tenderness: There is no right CVA tenderness or left CVA tenderness.     Comments: Abdomen is soft, non-distended, and non-tender in all quadrants. No rigidity, no guarding. No peritoneal signs.  Genitourinary:    Comments: Retail banker present for exam. Digital Rectal Exam reveals sphincter with good tone. No external hemorrhoids. No masses or fissures. Stool color is brown with no overt blood. No gross melena.  Musculoskeletal:        General: Normal range of motion.     Cervical back: Normal range of motion.  Skin:    General: Skin is warm and dry.     Capillary Refill: Capillary refill takes less than 2 seconds.  Neurological:     Mental Status: He is oriented to person, place, and time.     GCS: GCS eye subscore is 4. GCS verbal subscore is 5. GCS motor subscore is 6.     Comments: Fluent speech, no facial droop.  Psychiatric:        Behavior: Behavior normal.     ED Results / Procedures / Treatments   Labs (all labs ordered are listed, but only abnormal results are displayed) Labs Reviewed  BASIC METABOLIC PANEL - Abnormal; Notable for the following components:      Result Value   BUN 21 (*)    Creatinine, Ser 2.32 (*)    GFR calc non Af Amer 29 (*)    GFR calc Af Amer 34 (*)    All other components within normal limits  CBC WITH DIFFERENTIAL/PLATELET - Abnormal; Notable for the following components:   Eosinophils Absolute 1.2 (*)    Abs Immature Granulocytes 0.08 (*)    All other components within normal limits  HEPATIC FUNCTION PANEL  POC OCCULT BLOOD, ED    EKG None  Radiology No results found.  Procedures Procedures (including  critical care time)  Medications Ordered in ED Medications - No data to display  ED Course  I have reviewed the triage vital signs and the  nursing notes.  Pertinent labs & imaging results that were available during my care of the patient were reviewed by me and considered in my medical decision making (see chart for details).  Clinical Course as of Apr 22 1056  Wed Apr 23, 2019  0957 Fecal Occult Blood, POC: NEGATIVE [KA]  1010 Previous hemoglobin is 13.7, approximately 8 hours ago  Hemoglobin: 14.5 [KA]  1011 No leukocytosis.  No severe electrolyte derangement.  Normal liver function.   [KA]  1051 Similar to recent labs  Creatinine(!): 2.32 [KA]    Clinical Course User Index [KA] Cherre Robins, PA-C   Vitals:   04/23/19 1000 04/23/19 1015 04/23/19 1030 04/23/19 1045  BP: (!) 178/88 (!) 168/86 (!) 174/89 (!) 144/96  Pulse: 65 61 (!) 59 63  Resp: 15 20 18 18   Temp:      TempSrc:      SpO2: 99% 99% 97% 98%  Weight:      Height:         MDM Rules/Calculators/A&P    Patient seen and examined. He is well appearing, in no acute distress. On arrival he is hypertensive in triage 193/102, did not take morning BP meds. He is not tachycardic or hypoxic. On my exam he has no abdominal tenderness.  Fecal occult is negative. On DRE stool is soft and brown, no gross melena.   Labs as above in ED course.  Patient has had serial benign abdominal exams.  Doubt acute surgical abdomen.  He is tolerating p.o. intake while in the emergency department.  Discussed lab results with patient and engaged in shared decision making.  He agrees at this time imaging does not seem necessary as he is pain-free and has reassuring work-up.  No indications for transfusion at this time.  He has been hemodynamically stable throughout ED stay and at time of discharge.  Hypertension improved on blood pressure rechecked.  He is currently not taking any GI medications, will restart previous meds including Protonix and Carafate.  He states he has had most success with liquid Carafate in the past. The patient appears reasonably screened and/or stabilized  for discharge and I doubt any other medical condition or other [facility] requiring further screening, evaluation, or treatment in the ED at this time prior to discharge. The patient is safe for discharge with strict return precautions discussed. Recommend outpatient GI and PCP follow-up for further evaluation and symptom recheck. Findings and plan of care discussed with supervising physician Dr. Ronnald Nian.      Portions of this note were generated with [employer]obby[employer]. Dictation errors may occur despite best attempts at proofreading.    Final Clinical Impression(s) / ED Diagnoses Final diagnoses:  Nausea vomiting and diarrhea    Rx / DC Orders ED Discharge Orders         Ordered    pantoprazole (PROTONIX) 40 MG tablet  Daily     04/23/19 1049    sucralfate (CARAFATE) 1 GM/10ML suspension  3 times daily with meals & bedtime     04/23/19 1049           Teige Rountree, Harley Hallmark, PA-C 04/23/19 1058    Liandro Thelin, Harley Hallmark, PA-C 04/23/19 1059    Lennice Sites, DO 04/23/19 1423

## 2019-04-23 NOTE — ED Triage Notes (Signed)
Pt to ED reports black stools x 3 days, last one being a few hours ago, Also reports vomiting blood x 3 days, last occurrence 1 hour ago. Reports history of the same. Reports checked in to the ED last night for same problem, but left from the lobby and thought he would see his PCP, but returned to ED due to abdominal pain.

## 2019-04-23 NOTE — ED Notes (Signed)
Pt reports hx of HTN and that he has not taken his bp medications.

## 2019-04-23 NOTE — ED Notes (Signed)
Pt d/c home per MD order. Discharge summary reviewed with pt, pt verbalizes understanding. Ambulatory off unit, no complaints at discharge, reports wife is discharge ride home.

## 2019-04-23 NOTE — ED Triage Notes (Signed)
Pt reports black stools, blood in emesis and abdominal pain X3 weeks.  Pt denies any SOB or COVID symptoms.  Reports it "feels like my last bleeding ulcer."

## 2019-04-23 NOTE — Discharge Instructions (Addendum)
You have been seen today for diarrhea and vomiting.  Please read and follow all provided instructions. Return to the emergency room for worsening condition or new concerning symptoms.    Your hemoglobin today was normal when checked with your blood work.  1. Medications:  -Prescription sent to your pharmacy for Protonix.  This is a medication you have taken in the past.  Please start taking it again as prescribed. -Prescription also sent for liquid Carafate.  Please take this as we discussed and as prescribed.  Continue usual home medications Take medications as prescribed. Please review all of the medicines and only take them if you do not have an allergy to them.   2. Treatment: rest, drink plenty of fluids  3. Follow Up:  Please follow up with [facility] for outpatient appointment to further evaluate your symptoms. Call the office today to schedule follow up appointment -Also recommend primary care provider follow up to discuss your emergency room visit today.  It is also a possibility that you have an allergic reaction to any of the medicines that you have been prescribed - Everybody reacts differently to medications and while MOST people have no trouble with most medicines, you may have a reaction such as nausea, vomiting, rash, swelling, shortness of breath. If this is the case, please stop taking the medicine immediately and contact your physician.  ?

## 2019-04-24 ENCOUNTER — Other Ambulatory Visit: Payer: Self-pay

## 2019-04-24 ENCOUNTER — Emergency Department (HOSPITAL_COMMUNITY)
Admission: EM | Admit: 2019-04-24 | Discharge: 2019-04-24 | Discharge status: Against Medical Advice | Payer: PPO | Attending: Emergency Medicine | Admitting: Emergency Medicine

## 2019-04-24 ENCOUNTER — Encounter (HOSPITAL_COMMUNITY): Payer: Self-pay

## 2019-04-24 DIAGNOSIS — R109 Unspecified abdominal pain: Secondary | ICD-10-CM | POA: Diagnosis present

## 2019-04-24 DIAGNOSIS — Z5321 Procedure and treatment not carried out due to patient leaving prior to being seen by health care provider: Secondary | ICD-10-CM | POA: Diagnosis not present

## 2019-04-24 LAB — COMPREHENSIVE METABOLIC PANEL
ALT: 13 U/L (ref 0–44)
AST: 19 U/L (ref 15–41)
Albumin: 3.9 g/dL (ref 3.5–5.0)
Alkaline Phosphatase: 69 U/L (ref 38–126)
Anion gap: 11 (ref 5–15)
BUN: 24 mg/dL — ABNORMAL HIGH (ref 6–20)
CO2: 23 mmol/L (ref 22–32)
Calcium: 9 mg/dL (ref 8.9–10.3)
Chloride: 103 mmol/L (ref 98–111)
Creatinine, Ser: 2.22 mg/dL — ABNORMAL HIGH (ref 0.61–1.24)
GFR calc Af Amer: 36 mL/min — ABNORMAL LOW (ref >60)
GFR calc non Af Amer: 31 mL/min — ABNORMAL LOW (ref >60)
Glucose, Bld: 118 mg/dL — ABNORMAL HIGH (ref 70–99)
Potassium: 4.2 mmol/L (ref 3.5–5.1)
Sodium: 137 mmol/L (ref 135–145)
Total Bilirubin: 0.4 mg/dL (ref 0.3–1.2)
Total Protein: 7.1 g/dL (ref 6.5–8.1)

## 2019-04-24 LAB — CBC
HCT: 41.8 % (ref 39.0–52.0)
Hemoglobin: 13.2 g/dL (ref 13.0–17.0)
MCH: 28.6 pg (ref 26.0–34.0)
MCHC: 31.6 g/dL (ref 30.0–36.0)
MCV: 90.5 fL (ref 80.0–100.0)
Platelets: 273 10*3/uL (ref 150–400)
RBC: 4.62 MIL/uL (ref 4.22–5.81)
RDW: 12.7 % (ref 11.5–15.5)
WBC: 11.2 10*3/uL — ABNORMAL HIGH (ref 4.0–10.5)
nRBC: 0 % (ref 0.0–0.2)

## 2019-04-24 LAB — LIPASE, BLOOD: Lipase: 27 U/L (ref 11–51)

## 2019-04-24 MED ORDER — SODIUM CHLORIDE 0.9% FLUSH: 3.0000 mL | Freq: Once | INTRAVENOUS | Status: DC

## 2019-04-24 NOTE — ED Notes (Signed)
Pt. States he is leaving because his wife needs his car.

## 2019-04-24 NOTE — ED Triage Notes (Addendum)
Patient reports vomiting and states he was here this morning for the same problem. Patient also reports black tarry stools. Patient states he has seen a GI specialist and was given Sucralfate that did not help. Patient states he has ESRD and is having flank pain.

## 2019-04-25 DIAGNOSIS — R3912 Poor urinary stream: Secondary | ICD-10-CM | POA: Diagnosis not present

## 2019-04-25 DIAGNOSIS — N401 Enlarged prostate with lower urinary tract symptoms: Secondary | ICD-10-CM | POA: Diagnosis not present

## 2019-04-25 DIAGNOSIS — R300 Dysuria: Secondary | ICD-10-CM | POA: Diagnosis not present

## 2019-04-25 DIAGNOSIS — N411 Chronic prostatitis: Secondary | ICD-10-CM | POA: Diagnosis not present

## 2019-04-25 DIAGNOSIS — N3943 Post-void dribbling: Secondary | ICD-10-CM | POA: Diagnosis not present

## 2019-04-25 DIAGNOSIS — R3914 Feeling of incomplete bladder emptying: Secondary | ICD-10-CM | POA: Diagnosis not present

## 2019-04-28 ENCOUNTER — Telehealth: Payer: Self-pay

## 2019-04-28 NOTE — Telephone Encounter (Signed)
Patient had death in family and canceled his appointment when he call back to r/s please give him the date option 05/21/19 in the afternoon. If he cn not do this day then call [facility] and ask her for the next date he can be scheduled.

## 2019-04-30 ENCOUNTER — Ambulatory Visit: Payer: PPO | Admitting: Cardiology

## 2019-05-12 DIAGNOSIS — N3010 Interstitial cystitis (chronic) without hematuria: Secondary | ICD-10-CM | POA: Diagnosis not present

## 2019-05-12 DIAGNOSIS — F250 Schizoaffective disorder, bipolar type: Secondary | ICD-10-CM | POA: Diagnosis not present

## 2019-05-13 DIAGNOSIS — Z45018 Encounter for adjustment and management of other part of cardiac pacemaker: Secondary | ICD-10-CM | POA: Diagnosis not present

## 2019-05-13 DIAGNOSIS — Z950 Presence of cardiac pacemaker: Secondary | ICD-10-CM | POA: Diagnosis not present

## 2019-05-13 DIAGNOSIS — I495 Sick sinus syndrome: Secondary | ICD-10-CM | POA: Diagnosis not present

## 2019-05-20 DIAGNOSIS — Z131 Encounter for screening for diabetes mellitus: Secondary | ICD-10-CM | POA: Diagnosis not present

## 2019-05-20 DIAGNOSIS — Z0000 Encounter for general adult medical examination without abnormal findings: Secondary | ICD-10-CM | POA: Diagnosis not present

## 2019-05-20 DIAGNOSIS — F209 Schizophrenia, unspecified: Secondary | ICD-10-CM | POA: Diagnosis not present

## 2019-05-20 DIAGNOSIS — Z114 Encounter for screening for human immunodeficiency virus [HIV]: Secondary | ICD-10-CM | POA: Diagnosis not present

## 2019-05-20 DIAGNOSIS — Z13220 Encounter for screening for lipoid disorders: Secondary | ICD-10-CM | POA: Diagnosis not present

## 2019-05-20 DIAGNOSIS — N184 Chronic kidney disease, stage 4 (severe): Secondary | ICD-10-CM | POA: Diagnosis not present

## 2019-05-20 DIAGNOSIS — I10 Essential (primary) hypertension: Secondary | ICD-10-CM | POA: Diagnosis not present

## 2019-05-21 ENCOUNTER — Telehealth: Payer: Self-pay

## 2019-05-21 ENCOUNTER — Ambulatory Visit (INDEPENDENT_AMBULATORY_CARE_PROVIDER_SITE_OTHER): Payer: PPO | Admitting: Cardiology

## 2019-05-21 ENCOUNTER — Other Ambulatory Visit: Payer: Self-pay

## 2019-05-21 ENCOUNTER — Encounter: Payer: Self-pay | Admitting: Cardiology

## 2019-05-21 VITALS — BP 188/104 | HR 64 | Ht 72.0 in | Wt 260.0 lb

## 2019-05-21 DIAGNOSIS — I10 Essential (primary) hypertension: Secondary | ICD-10-CM

## 2019-05-21 DIAGNOSIS — I495 Sick sinus syndrome: Secondary | ICD-10-CM | POA: Diagnosis not present

## 2019-05-21 DIAGNOSIS — R600 Localized edema: Secondary | ICD-10-CM | POA: Diagnosis not present

## 2019-05-21 DIAGNOSIS — Z950 Presence of cardiac pacemaker: Secondary | ICD-10-CM

## 2019-05-21 DIAGNOSIS — N183 Chronic kidney disease, stage 3 unspecified: Secondary | ICD-10-CM | POA: Diagnosis not present

## 2019-05-21 DIAGNOSIS — Z45018 Encounter for adjustment and management of other part of cardiac pacemaker: Secondary | ICD-10-CM

## 2019-05-21 DIAGNOSIS — I130 Hypertensive heart and chronic kidney disease with heart failure and stage 1 through stage 4 chronic kidney disease, or unspecified chronic kidney disease: Secondary | ICD-10-CM | POA: Diagnosis not present

## 2019-05-21 DIAGNOSIS — I441 Atrioventricular block, second degree: Secondary | ICD-10-CM | POA: Diagnosis not present

## 2019-05-21 MED ORDER — FUROSEMIDE 40 MG PO TABS: 40.0000 mg | ORAL_TABLET | Freq: Every day | ORAL | 2 refills | Status: DC

## 2019-05-21 NOTE — Telephone Encounter (Signed)
Pt aware.

## 2019-05-21 NOTE — Patient Instructions (Signed)
Take furosemide 40 mg once a day, if you do not notice that your urine output has increased, you can start taking 2 tablets of furosemide 40 mg daily.

## 2019-05-21 NOTE — Progress Notes (Signed)
Primary Physician/Referring:  Marda Stalker, PA-C  Patient ID: Peter Marsh, male    DOB: Jun 27, 1960, 59 y.o.   MRN: [id number]  No chief complaint on file.  HPI:    Peter Marsh  is a 59 y.o. Caucasian male with  h/o schizophrenia, hypertension, chronic stage 3 CKD, bronchial asthma, h/o GI bleed in may of 2018 while in the hospital had atrial flutter with 2:1 conduction with RVR, again during a  nuclear stress test in Nov 2017. He has had recurrent episodes of syncope and underwent EP evaluation and there was no inducible arrhythmias however due to high grade conduction system disease and a permanent Medtronic pacemaker was implanted using His bundle on 02/26/2017 by Dr. Crissie Sickles and anticoagulation was discontinued.  He was admitted to the hospital on 11/19/2018 and discharged 3 days later with acute mental status changes and agitation felt to be due to hypertensive urgency, neurologic workup including CT of head and neck and MRI of the brain did not show any stroke or significant vascular disease.  Clonidine dose was increased to 0.2 mg b.i.d.  He now presents here to me for one year follow-up.  In the interim he was also found to have high-grade right hydronephrosis on 12/25/2018, underwent laser procedure (details not available) and told to have resolution of hydronephrosis Mayme Genta, MD). Again in Dec 2020 found to have pyelonephritis and treated with antibiotics and now he feels well.   He is presently doing well and states that symptoms treated for pyelonephritis an infection in his kidneys, appetite is improved, his gained weight and is also started having leg edema.  Otherwise feels well.  Presents here for pacemaker check and hypertension follow-up.  No PND or orthopnea.  Past Medical History:  Diagnosis Date  . Asthma    uses inhalers   . Bilateral carotid bruits   . Cardiac conduction disorder 2018   s/p MDT PPM  . Chronic kidney disease    bladder  interstial cystitis   . Chronic kidney disease (CKD), stage IV (severe) ([name])    followed by Dr. Joelyn Oms at [facility]  . Depression   . Encounter for care of pacemaker 02/13/2019  . GERD (gastroesophageal reflux disease)   . History of stomach ulcers 2001  . Hypertension   . LBBB (left bundle branch block)   . Lower extremity edema   . Mild intermittent asthma without complication   . Mixed hyperlipidemia   . Mobitz type 2 second degree AV block 04/06/2019  . Pacemaker: Medtronic Azure XT DR MRI [id number] PPM -  BUNDLE OF HIS pacing  02/26/2017   Scheduled Remote pacemaker check  11/12/2018:  There were 24 Fast AV episodes:  EGMs show SVTs. Episodes lasted < 2 minutes. Health trends do not demonstrate significant abnormality. Battery longevity is 9.4 - 10.3 years. RA pacing is 47.1 %, RV pacing is 40.4 %.  Clinic check 11/07/17.   . Paroxysmal atrial flutter ([place])   . PONV (postoperative nausea and vomiting)   . Prostatitis   . Schizophrenia ([place])   . Sinus node dysfunction ([place]) 02/13/2019   Past Surgical History:  Procedure Laterality Date  . APPENDECTOMY    . ELECTROPHYSIOLOGY STUDY N/A 02/26/2017   Procedure: ELECTROPHYSIOLOGY STUDY;  Surgeon: Evans Lance, MD;  Location: [place] [facility];  Service: Cardiovascular;  Laterality: N/A;  . ESOPHAGOGASTRODUODENOSCOPY (EGD) WITH PROPOFOL N/A 09/18/2016   Procedure: ESOPHAGOGASTRODUODENOSCOPY (EGD) WITH PROPOFOL;  Surgeon: Ronnette Juniper, MD;  Location: [facility]  ENDOSCOPY;  Service: Gastroenterology;  Laterality: N/A;  . LUMBAR LAMINECTOMY/DECOMPRESSION MICRODISCECTOMY  04/19/2011   Procedure: LUMBAR LAMINECTOMY/DECOMPRESSION MICRODISCECTOMY;  Surgeon: Tobi Bastos;  Location: [facility];  Service: Orthopedics;  Laterality: Left;  Hemi LAminectomy/Microdiscectomy Lumbar four  - Lumbar five  on the Left (X-Ray)  . PACEMAKER IMPLANT N/A 02/26/2017   Procedure: PACEMAKER IMPLANT;  Surgeon: Evans Lance, MD;  Location: [place] [facility];   Service: Cardiovascular;  Laterality: N/A;  . TONSILLECTOMY     Social History   Socioeconomic History  . Marital status: Married    Spouse name: Not on file  . Number of children: 1  . Years of education: Not on file  . Highest education level: Not on file  Occupational History  . Not on file  Tobacco Use  . Smoking status: Former Smoker    Packs/day: 1.00    Years: 30.00    Pack years: 30.00    Quit date: 05/08/2008    Years since quitting: 11.0  . Smokeless tobacco: Never Used  Substance and Sexual Activity  . Alcohol use: No  . Drug use: No  . Sexual activity: Never  Other Topics Concern  . Not on file  Social History Narrative  . Not on file   Social Determinants of Health   Financial Resource Strain:   . Difficulty of Paying Living Expenses: Not on file  Food Insecurity:   . Worried About Charity fundraiser in the Last Year: Not on file  . Ran Out of Food in the Last Year: Not on file  Transportation Needs:   . Lack of Transportation (Medical): Not on file  . Lack of Transportation (Non-Medical): Not on file  Physical Activity:   . Days of Exercise per Week: Not on file  . Minutes of Exercise per Session: Not on file  Stress:   . Feeling of Stress : Not on file  Social Connections:   . Frequency of Communication with Friends and Family: Not on file  . Frequency of Social Gatherings with Friends and Family: Not on file  . Attends Religious Services: Not on file  . Active Member of Clubs or Organizations: Not on file  . Attends Archivist Meetings: Not on file  . Marital Status: Not on file  Intimate Partner Violence:   . Fear of Current or Ex-Partner: Not on file  . Emotionally Abused: Not on file  . Physically Abused: Not on file  . Sexually Abused: Not on file   ROS  Review of Systems  Constitution: Positive for weight gain.  Cardiovascular: Positive for leg swelling. Negative for chest pain, dyspnea on exertion and syncope.  Endocrine:  Negative for cold intolerance.  Hematologic/Lymphatic: Does not bruise/bleed easily.  Musculoskeletal: Negative for joint swelling.  Gastrointestinal: Negative for abdominal pain, anorexia, change in bowel habit, hematochezia and melena.  Neurological: Negative for headaches and light-headedness.  Psychiatric/Behavioral: Positive for depression. Negative for substance abuse. The patient has insomnia.   All other systems reviewed and are negative.  Objective   Vitals with BMI 05/21/2019 04/24/2019 04/23/2019  Height 6\' 0"  6\' 0"  -  Weight 260 lbs 229 lbs 15 oz -  BMI 79.89 21.19 -  Systolic 417 408 144  Diastolic 818 563 96  Pulse 64 71 63    Physical Exam  Constitutional: No distress.  He is well-built and mildly obese in no acute distress.  HENT:  Head: Atraumatic.  Eyes: Conjunctivae are normal.  Neck: No JVD  present. No thyromegaly present.  Cardiovascular: Normal rate and regular rhythm. Exam reveals no gallop.  No murmur heard. Pulses:      Carotid pulses are on the right side with bruit and on the left side with bruit.      Popliteal pulses are 2+ on the right side and 2+ on the left side.       Dorsalis pedis pulses are 2+ on the right side and 2+ on the left side.       Posterior tibial pulses are 0 on the right side and 0 on the left side.  S1 is normal,paradoxically split S2,  2 + leg edema, no JVD.  Pulmonary/Chest: Effort normal and breath sounds normal.  Abdominal: Soft. Bowel sounds are normal.  Musculoskeletal:        General: Normal range of motion.     Cervical back: Neck supple.  Neurological: He is alert.  Skin: Skin is warm and dry.  Psychiatric: He has a normal mood and affect.   Laboratory examination:    Recent Labs    04/23/19 0121 04/23/19 0919 04/24/19 0400  NA 139 137 137  K 4.5 4.7 4.2  CL 104 102 103  CO2 28 25 23   GLUCOSE 100* 91 118*  BUN 20 21* 24*  CREATININE 2.38* 2.32* 2.22*  CALCIUM 9.3 9.3 9.0  GFRNONAA 29* 29* 31*  GFRAA  33* 34* 36*   CrCl cannot be calculated (Patient's most recent lab result is older than the maximum 21 days allowed.).  CMP Latest Ref Rng & Units 04/24/2019 04/23/2019 04/23/2019  Glucose 70 - 99 mg/dL 118(H) 91 100(H)  BUN 6 - 20 mg/dL 24(H) 21(H) 20  Creatinine 0.61 - 1.24 mg/dL 2.22(H) 2.32(H) 2.38(H)  Sodium 135 - 145 mmol/L 137 137 139  Potassium 3.5 - 5.1 mmol/L 4.2 4.7 4.5  Chloride 98 - 111 mmol/L 103 102 104  CO2 22 - 32 mmol/L 23 25 28   Calcium 8.9 - 10.3 mg/dL 9.0 9.3 9.3  Total Protein 6.5 - 8.1 g/dL 7.1 7.6 7.2  Total Bilirubin 0.3 - 1.2 mg/dL 0.4 0.9 0.4  Alkaline Phos 38 - 126 U/L 69 71 72  AST 15 - 41 U/L 19 19 18   ALT 0 - 44 U/L 13 13 14    CBC Latest Ref Rng & Units 04/24/2019 04/23/2019 04/23/2019  WBC 4.0 - 10.5 K/uL 11.2(H) 10.0 10.3  Hemoglobin 13.0 - 17.0 g/dL 13.2 14.5 13.7  Hematocrit 39.0 - 52.0 % 41.8 45.1 43.2  Platelets 150 - 400 K/uL 273 259 239   Lipid Panel     Component Value Date/Time   CHOL 169 11/20/2018 0432   TRIG 96 11/20/2018 0432   HDL 25 (L) 11/20/2018 0432   CHOLHDL 6.8 11/20/2018 0432   VLDL 19 11/20/2018 0432   LDLCALC 125 (H) 11/20/2018 0432   HEMOGLOBIN A1C Lab Results  Component Value Date   HGBA1C 5.7 (H) 11/20/2018   MPG 116.89 11/20/2018   TSH Recent Labs    06/24/18 0924  TSH 1.673   Medications and allergies   Allergies  Allergen Reactions  . Methylpyrrolidone Hives    froze the intestine  . Niacin Other (See Comments) and Nausea And Vomiting    Flushing, itching, tingling   . Norvasc [Amlodipine Besylate] Other (See Comments)    Swollen Feet  . Oxybutynin Chloride [Oxybutynin Chloride Er] Other (See Comments)    froze the intestine  . Vesicare [Solifenacin Succinate] Other (See Comments)    Froze  the intestine   . Ciprofloxacin Rash and Other (See Comments)    Felt flushed   . Oxybutynin Rash  . Solifenacin Rash     Current Outpatient Medications  Medication Instructions  . acetaminophen  (TYLENOL) 650 mg, Oral, Every 6 hours PRN  . albuterol (PROVENTIL HFA;VENTOLIN HFA) 108 (90 BASE) MCG/ACT inhaler 2 puffs, Inhalation, Every 6 hours PRN  . benazepril (LOTENSIN) 10 mg, Oral, Daily  . budesonide-formoterol (SYMBICORT) 80-4.5 MCG/ACT inhaler 2 puffs, Inhalation, 2 times daily  . buPROPion (WELLBUTRIN SR) 150 mg, Oral, 2 times daily  . cloNIDine (CATAPRES) 0.2 mg, Oral, 2 times daily  . doxycycline (DORYX) 100 mg, Oral, 2 times daily  . furosemide (LASIX) 40 mg, Oral, Daily  . hydrALAZINE (APRESOLINE) 50 mg, Oral, 3 times daily  . isosorbide dinitrate (ISORDIL) 30 MG tablet TAKE 1 TABLET(30 MG) BY MOUTH FOUR TIMES DAILY  . metoprolol tartrate (LOPRESSOR) 100 mg, Oral, 2 times daily  . montelukast (SINGULAIR) 10 mg, Oral, Daily at bedtime  . paliperidone (INVEGA) 6 mg, Oral, Daily at bedtime  . pantoprazole (PROTONIX) 40 mg, Oral, Daily  . tamsulosin (FLOMAX) 0.4 mg, Oral, Daily    Radiology:  No results found. Cardiac Studies:   Echocardiogram [03/28/2016]: 1. Left ventricle cavity is normal in size. Mild concentric hypertrophy of the left ventricle. Borderline normal LV syst. function. Visual EF is approx.50%. Abnormal septal wall motion due to left bundle branch block. Doppler evidence of grade I (impaired) diastolic dysfunction. 2. Mild (Grade I) mitral regurgitation. 3. Mild tricuspid regurgitation. No evidence of pulmonary Hypertension.   Lexiscan sestamibi stress test 03/13/2016: 1. The resting electrocardiogram demonstrated sinus tachycardia/Atrial tachycaardia 152/min with underlying LBBB. Stress EKG is non-diagnostic for ischemia as it a pharmacologic stress using Lexiscan. Patient developed wide-complex tachycardia, what appears to be probably A. Flutter with 2:1 conduction at the rate of 156 bpm with Lexiscan infusion without additional ST-T wave changes of ischemia. 2. The perfusion imaging studies demonstrate anterior and anteroseptal wall thickening without  demonstrable ischemia or scar. LVEF calculated by QGS was 52%. This is a low risk study.  Carotid Doppler [03/28/2016]: No hemodynamically significant arterial disease in the internal carotid artery bilaterally. Carotid vessels are tortuous and may be the source of bruit. No significant plaque burden. Antegrade vertebral artery flow.  Renal Artery duplex 04/11/2019:  Right: Severe hydronephrosis in the right kidney as noted on        previous renal ultrasounds 12/25/2018 preventing thorought        visualization of the renal artery. A 2.77 x 2.56 hypoechoic        cystic structure noted on the posterior pole. Left:  No evidence of left renal artery stenosis. A 3.30 x 3.66        hypoechoic cycstic structure noted on the upper pole. No        hydronephrosis visualized on the left.   *See table(s) above for measurements and observations.   Diagnosing physician: Servando Snare MD  Assessment     ICD-10-CM   1. Pacemaker: Medtronic Azure XT DR MRI [id number] PPM -  BUNDLE OF HIS pacing   Z95.0   2. Encounter for care of pacemaker  Z45.018   3. Sinus node dysfunction ([name])  I49.5   4. Mobitz type 2 second degree AV block  I44.1   5. Essential hypertension  I10 furosemide (LASIX) 40 MG tablet  6. Hypertensive heart and chronic kidney disease with heart failure and stage 1 through stage 4 chronic  kidney disease, or chronic kidney disease ([name])  I13.0 furosemide (LASIX) 40 MG tablet  7. Bilateral leg edema  R60.0     EKG 03/19/2019: Normal sinus rhythm at rate of 61 bpm, left bundle branch block.  No further analysis. No significant change from prior EKG.  Scheduled Remote pacemaker check  10.6.20:  There were 4 SVTs detected lasting up to 98min 55 secs in duration with avg A/V rates up to 162bpm. Health trends do not demonstrate significant abnormality. Battery longevity is 8.8-9.7 years. RA pacing is 59.6 %, RV pacing is 52.3%.  Scheduled  In office pacemaker check 05/21/19  Single (S)/Dual  (D)/BV: D. Presenting APVP. Pacemaker dependant:  No. Underlying ASVS @ 60/min. AP 59%, VP 52%. AMS Episodes 0. HVR 0.  Longevity 8.6 Years. Magnet rate: >85%. Lead measurements: Stable. Thoracic impedance: NA. Histogram: Low (L)/normal (N)/high (H)  Low. Patient activity Good.   Observations: Normal pacemaker function. Changes: Turned on Rate response.  Recommendations:   Meds ordered this encounter  Medications  . furosemide (LASIX) 40 MG tablet    Sig: Take 1 tablet (40 mg total) by mouth daily.    Dispense:  30 tablet    Refill:  2    Peter Marsh is a 59 y.o. Caucasian male patient with h/o schizophrenia, hypertension, chronic stage 3-4 CKD, bronchial asthma, h/o GI bleed in May of 2018 and also had atrial flutter at that time, patient also has paroxysmal atrial fibrillation. He has permanent pacemaker implantation done on 02/26/2017 for high degree AV block and recurrent syncope by Dr. Cristopher Peru and anticoagulation was discontinued due to GI Bleed.  He is presently doing well and states that symptoms treated for pyelonephritis an infection in his kidneys, appetite is improved, his gained weight and is also started having leg edema.  Otherwise feels well.  Presents here for pacemaker check and hypertension follow-up.  No PND or orthopnea.  Blood pressure is uncontrolled, he is fluid overloaded, probably related to his diet and weight gain.  I started him on 40 mg of Lasix daily, advised him to take 2 tablets if he does not respond to increased diuresis.  He does have stage IV chronic kidney disease as well.  I'd like to see him back in 3 weeks for follow-up of hypertension and also leg edema.  Adrian Prows, MD, [facility] 05/21/2019, 2:52 PM [facility] [place] Pager: [phone] Office: [phone] If no answer Cell [phone]

## 2019-05-21 NOTE — Telephone Encounter (Signed)
-----   Message from Adrian Prows, MD sent at 05/17/2019  8:50 PM EST ----- Regarding: ICD Normal function. WIll discuss more on his OV in 5 days.   JG

## 2019-06-03 DIAGNOSIS — I159 Secondary hypertension, unspecified: Secondary | ICD-10-CM | POA: Diagnosis not present

## 2019-06-03 DIAGNOSIS — Z87438 Personal history of other diseases of male genital organs: Secondary | ICD-10-CM | POA: Diagnosis not present

## 2019-06-03 DIAGNOSIS — N183 Chronic kidney disease, stage 3 unspecified: Secondary | ICD-10-CM | POA: Diagnosis not present

## 2019-06-03 DIAGNOSIS — Z20822 Contact with and (suspected) exposure to covid-19: Secondary | ICD-10-CM | POA: Diagnosis not present

## 2019-06-04 DIAGNOSIS — I10 Essential (primary) hypertension: Secondary | ICD-10-CM | POA: Diagnosis not present

## 2019-06-06 DIAGNOSIS — N183 Chronic kidney disease, stage 3 unspecified: Secondary | ICD-10-CM | POA: Diagnosis not present

## 2019-06-06 DIAGNOSIS — I159 Secondary hypertension, unspecified: Secondary | ICD-10-CM | POA: Diagnosis not present

## 2019-06-10 DIAGNOSIS — R51 Headache with orthostatic component, not elsewhere classified: Secondary | ICD-10-CM | POA: Diagnosis not present

## 2019-06-10 DIAGNOSIS — R42 Dizziness and giddiness: Secondary | ICD-10-CM | POA: Diagnosis not present

## 2019-06-11 DIAGNOSIS — I159 Secondary hypertension, unspecified: Secondary | ICD-10-CM | POA: Diagnosis not present

## 2019-06-11 DIAGNOSIS — R001 Bradycardia, unspecified: Secondary | ICD-10-CM | POA: Diagnosis not present

## 2019-06-12 ENCOUNTER — Ambulatory Visit: Payer: PPO | Admitting: Cardiology

## 2019-06-17 DIAGNOSIS — Z79899 Other long term (current) drug therapy: Secondary | ICD-10-CM | POA: Diagnosis not present

## 2019-06-17 DIAGNOSIS — N1330 Unspecified hydronephrosis: Secondary | ICD-10-CM | POA: Diagnosis not present

## 2019-06-17 DIAGNOSIS — I10 Essential (primary) hypertension: Secondary | ICD-10-CM | POA: Diagnosis not present

## 2019-06-17 DIAGNOSIS — N183 Chronic kidney disease, stage 3 unspecified: Secondary | ICD-10-CM | POA: Diagnosis not present

## 2019-06-19 DIAGNOSIS — N1330 Unspecified hydronephrosis: Secondary | ICD-10-CM | POA: Diagnosis not present

## 2019-07-03 DIAGNOSIS — N1330 Unspecified hydronephrosis: Secondary | ICD-10-CM | POA: Diagnosis not present

## 2019-07-03 DIAGNOSIS — N183 Chronic kidney disease, stage 3 unspecified: Secondary | ICD-10-CM | POA: Diagnosis not present

## 2019-07-03 DIAGNOSIS — I10 Essential (primary) hypertension: Secondary | ICD-10-CM | POA: Diagnosis not present

## 2019-07-04 DIAGNOSIS — N131 Hydronephrosis with ureteral stricture, not elsewhere classified: Secondary | ICD-10-CM | POA: Diagnosis not present

## 2019-07-10 DIAGNOSIS — R112 Nausea with vomiting, unspecified: Secondary | ICD-10-CM | POA: Diagnosis not present

## 2019-07-10 DIAGNOSIS — J301 Allergic rhinitis due to pollen: Secondary | ICD-10-CM | POA: Diagnosis not present

## 2019-07-10 DIAGNOSIS — R1084 Generalized abdominal pain: Secondary | ICD-10-CM | POA: Diagnosis not present

## 2019-07-27 NOTE — Progress Notes (Signed)
Cardiology Office Note:   Date:  07/28/2019  NAME:  Peter Marsh    MRN: [id number] DOB:  1960/12/11   PCP:  Marda Stalker, PA-C  Cardiologist:  Adrian Prows, MD  Electrophysiologist:  Cristopher Peru, MD   Referring MD: Marda Stalker, PA-C   Chief Complaint  Patient presents with  . Abnormal ECG    History of Present Illness:   Peter Marsh is a 59 y.o. male with a hx of schizophrenia, HTN, CKD IV, atrial flutter (no AC due to GI Bleed), high grade conduction disease s/p dual chamber ppm who is being seen today for the evaluation of abnormal EKG at the request of Marda Stalker, PA-C.  He has followed with Dr. Einar Gip in the past.  He reports that he would like to switch providers.  This is the reason for his visit today.  Is been followed for blood pressure as well as volume control with Dr. Einar Gip in the past.  Blood pressure is 116/82 which is well controlled.  He also has a Medtronic dual-chamber pacemaker that was placed in 2018 by Dr. Crissie Sickles.  Dr. Einar Gip was following the pacemaker.  We will have him be followed by Dr. Frankey Shown here in our office.  He does have issues with a right ureteral narrowing for which she will undergo procedure later this summer.  I did inform him that is okay.  He reports that he is walking up to 1/2 mile per day.  He describes no chest pain or shortness of breath.  He reports that his volume status has been under good control on Lasix.  Blood pressure medicines include benazepril, clonidine, hydralazine, Isordil Sorbide dinitrate.  He does have a history of low normal ejection fraction around 50%.  He seems to be doing well per report today.  Most recent LDL profile is 125.  He is on a statin.  He probably should be given his CKD.  I discussed this with him today.  He is okay to start Crestor.  He denies chest pain, shortness of breath, palpitations today.  EKG shows dual-chamber pacemaker.  Problem List 1. Schizophrenia  2. HTN 3. CKD IV 4. Atrial  flutter -no AC due to GI bleed   5. High grade conduction (HV ~70 msec) disease s/p dual chamber ppm (medtronic) 6. HLD -T chol 169, HDK 25, LDL 125, TG 96 7. Right ureter narrowing  8. COPD  Past Medical History: Past Medical History:  Diagnosis Date  . Asthma    uses inhalers   . Bilateral carotid bruits   . Cardiac conduction disorder 2018   s/p MDT PPM  . Chronic kidney disease    bladder interstial cystitis   . Chronic kidney disease (CKD), stage IV (severe) ([name])    follow[facility] by [facility] Joelyn [facility]ms at [facility]  . Depression   . Encounter for care of pacemaker 02/13/2019  . GERD (gastroesophageal reflux disease)   . History of stomach ulcers 2001  . Hypertension   . LBBB (left bundle branch block)   . Lower extremity edema   . Mild intermittent asthma without complication   . Mixed hyperlipidemia   . Mobitz type 2 second degree AV block 04/06/2019  . Pacemaker: Medtronic Azure XT DR MRI [id number] PPM -  BUNDLE OF HIS pacing  02/26/2017   Scheduled Remote pacemaker check  11/12/2018:  There were 24 Fast AV episodes:  EGMs show SVTs. Episodes lasted < 2 minutes. Health trends do not demonstrate significant abnormality. Battery  longevity is 9.4 - 10.3 years. RA pacing is 47.1 %, RV pacing is 40.4 %.  Clinic check 11/07/17.   . Paroxysmal atrial flutter ([place])   . PONV (postoperative nausea and vomiting)   . Prostatitis   . Schizophrenia ([place])   . Sinus node dysfunction ([place]) 02/13/2019    Past Surgical History: Past Surgical History:  Procedure Laterality Date  . APPENDECTOMY    . ELECTROPHYSIOLOGY STUDY N/A 02/26/2017   Procedure: ELECTROPHYSIOLOGY STUDY;  Surgeon: Evans Lance, MD;  Location: [facility];  Service: Cardiovascular;  Laterality: N/A;  . ESOPHAGOGASTRODUODENOSCOPY (EGD) WITH PROPOFOL N/A 09/18/2016   Procedure: ESOPHAGOGASTRODUODENOSCOPY (EGD) WITH PROPOFOL;  Surgeon: Ronnette Juniper, MD;  Location: [place] [facility];  Service: Gastroenterology;  Laterality:  N/A;  . LUMBAR LAMINECTOMY/DECOMPRESSION MICRODISCECTOMY  04/19/2011   Procedure: LUMBAR LAMINECTOMY/DECOMPRESSION MICRODISCECTOMY;  Surgeon: Tobi Bastos;  Location: [facility];  Service: Orthopedics;  Laterality: Left;  Hemi LAminectomy/Microdiscectomy Lumbar four  - Lumbar five  on the Left (X-Ray)  . PACEMAKER IMPLANT N/A 02/26/2017   Procedure: PACEMAKER IMPLANT;  Surgeon: Evans Lance, MD;  Location: [facility];  Service: Cardiovascular;  Laterality: N/A;  . TONSILLECTOMY      Current Medications: Current Meds  Medication Sig  . acetaminophen (TYLENOL) 325 MG tablet Take 650 mg by mouth every 6 (six) hours as needed for mild pain or headache.  . albuterol (PROVENTIL HFA;VENTOLIN HFA) 108 (90 BASE) MCG/ACT inhaler Inhale 2 puffs into the lungs every 6 (six) hours as needed for wheezing or shortness of breath.   . benazepril (LOTENSIN) 10 MG tablet Take 1 tablet (10 mg total) by mouth daily.  . budesonide-formoterol (SYMBICORT) 80-4.5 MCG/ACT inhaler Inhale 2 puffs into the lungs 2 (two) times daily.  [name] buPROPion (WELLBUTRIN SR) 150 MG 12 hr tablet Take 150 mg by mouth 2 (two) times daily.   . cloNIDine (CATAPRES) 0.2 MG tablet Take 1 tablet (0.2 mg total) by mouth 2 (two) times daily.  [name] doxycycline (DORYX) 100 MG EC tablet Take 100 mg by mouth 2 (two) times daily.  . furosemide (LASIX) 40 MG tablet Take 1 tablet (40 mg total) by mouth daily.  . hydrALAZINE (APRESOLINE) 50 MG tablet Take 1 tablet (50 mg total) by mouth 3 (three) times daily.  . isosorbide dinitrate (ISORDIL) 30 MG tablet TAKE 1 TABLET(30 MG) BY MOUTH FOUR TIMES DAILY  . metoprolol tartrate (LOPRESSOR) 100 MG tablet Take 1 tablet (100 mg total) by mouth 2 (two) times daily.  . montelukast (SINGULAIR) 10 MG tablet Take 10 mg by mouth at bedtime.  . paliperidone (INVEGA) 6 MG 24 hr tablet Take 6 mg by mouth at bedtime.  . pantoprazole (PROTONIX) 40 MG tablet Take 1 tablet (40 mg total) by mouth daily.  .  tamsulosin (FLOMAX) 0.4 MG CAPS capsule Take 0.4 mg by mouth daily.     Allergies:    Methylpyrrolidone, Niacin, Norvasc [amlodipine besylate], Oxybutynin chloride [oxybutynin chloride er], Vesicare [solifenacin succinate], Ciprofloxacin, Oxybutynin, and Solifenacin   Social History: Social History   Socioeconomic History  . Marital status: Married    Spouse name: Not on file  . Number of children: 1  . Years of education: Not on file  . Highest education level: Not on file  Occupational History  . Not on file  Tobacco Use  . Smoking status: Former Smoker    Packs/day: 1.00    Years: 30.00    Pack years: 30.00    Quit date: 05/08/2008  Years since quitting: 11.2  . Smokeless tobacco: Never Used  Substance and Sexual Activity  . Alcohol use: No  . Drug use: No  . Sexual activity: Never  Other Topics Concern  . Not on file  Social History Narrative  . Not on file   Social Determinants of Health   Financial Resource Strain:   . Difficulty of Paying Living Expenses:   Food Insecurity:   . Worried About Charity fundraiser in the Last Year:   . Arboriculturist in the Last Year:   Transportation Needs:   . Film/video editor (Medical):   [name] Lack of Transportation (Non-Medical):   Physical Activity:   . Days of Exercise per Week:   . Minutes of Exercise per Session:   Stress:   . Feeling of Stress :   Social Connections:   . Frequency of Communication with Friends and Family:   . Frequency of Social Gatherings with Friends and Family:   . Attends Religious Services:   . Active Member of Clubs or Organizations:   . Attends Archivist Meetings:   [name] Marital Status:      Family History: The patient's family history includes Alzheimer's disease in his father; High blood pressure in his mother.  ROS:   All other ROS reviewed and negative. Pertinent positives noted in the HPI.     EKGs/Labs/Other Studies Reviewed:   The following studies were personally  reviewed by me today:  EKG:  EKG is ordered today.  The ekg ordered today demonstrates AV paced rhythm, heart rate 63, and was personally reviewed by me.   Echocardiogram [03/28/2016]: 1. Left ventricle cavity is normal in size. Mild concentric hypertrophy of the left ventricle. Borderline normal LV syst. function. Visual EF is approx.50%. Abnormal septal wall motion due to left bundle branch block. Doppler evidence of grade I (impaired) diastolic dysfunction. 2. Mild (Grade I) mitral regurgitation. 3. Mild tricuspid regurgitation. No evidence of pulmonary Hypertension.   Lexiscan sestamibi stress test 03/13/2016: 1. The resting electrocardiogram demonstrated sinus tachycardia/Atrial tachycaardia 152/min with underlying LBBB. Stress EKG is non-diagnostic for ischemia as it a pharmacologic stress using Lexiscan. Patient developed wide-complex tachycardia, what appears to be probably A. Flutter with 2:1 conduction at the rate of 156 bpm with Lexiscan infusion without additional ST-T wave changes of ischemia. 2. The perfusion imaging studies demonstrate anterior and anteroseptal wall thickening without demonstrable ischemia or scar. LVEF calculated by QGS was 52%. This is a low risk study.  Recent Labs: 04/24/2019: ALT 13; BUN 24; Creatinine, Ser 2.22; Hemoglobin 13.2; Platelets 273; Potassium 4.2; Sodium 137   Recent Lipid Panel    Component Value Date/Time   CHOL 169 11/20/2018 0432   TRIG 96 11/20/2018 0432   HDL 25 (L) 11/20/2018 0432   CHOLHDL 6.8 11/20/2018 0432   VLDL 19 11/20/2018 0432   LDLCALC 125 (H) 11/20/2018 0432    Physical Exam:   VS:  BP 116/82   Pulse 63   Ht 6' (1.829 m)   Wt 253 lb 9.6 oz (115 kg)   SpO2 99%   BMI 34.39 kg/m    Wt Readings from Last 3 Encounters:  07/28/19 253 lb 9.6 oz (115 kg)  05/21/19 260 lb (117.9 kg)  04/24/19 229 lb 15 oz (104.3 kg)    General: Well nourished, well developed, in no acute distress Heart: Atraumatic, normal size  Eyes:  PEERLA, EOMI  Neck: Supple, no JVD Endocrine: No thryomegaly Cardiac: Normal S1,  S2; RRR; no murmurs, rubs, or gallops Lungs: Clear to auscultation bilaterally, no wheezing, rhonchi or rales  Abd: Soft, nontender, no hepatomegaly  Ext: No edema, pulses 2+ Musculoskeletal: No deformities, BUE and BLE strength normal and equal Skin: Warm and dry, no rashes   Neuro: Alert and oriented to person, place, time, and situation, CNII-XII grossly intact, no focal deficits  Psych: Normal mood and affect   ASSESSMENT:   Peter Marsh is a 59 y.o. male who presents for the following: 1. Nonspecific abnormal electrocardiogram (ECG) (EKG)   2. Essential hypertension   3. Mixed hyperlipidemia   4. Sinus node dysfunction ([name])   5. Pacemaker: Medtronic Azure XT DR MRI [id number] PPM -  BUNDLE OF HIS pacing    6. Typical atrial flutter ([name])     PLAN:   1. Nonspecific abnormal electrocardiogram (ECG) (EKG) 2. Essential hypertension -Blood pressure well controlled today.  116/83.  We will continue his current home medications.  These include benazepril 10 mg daily, clonidine 0.2 mg twice daily, hydralazine 50 mg 3 times daily, Isordil dinitrate 30 mg 3 times daily, metoprolol tartrate 100 mg twice daily.  He is also on Lasix 40 mg daily.  He follows with his nephrologist in [facility].  Volume status is acceptable today.  3. Mixed hyperlipidemia -Most recent LDL 125.  Given his CKD he is high risk for CAD.  I would prefer starting Crestor.  We will start today.  He will see [place] back in 3 months for virtual appointment we will check a lipid profile 1 week before that appointment.  4. Sinus node dysfunction ([name]) 5. Pacemaker: Medtronic Azure XT DR MRI [id number] PPM -  BUNDLE OF HIS pacing  -Status post dual-chamber pacemaker due to high-grade conduction disease.  He has a Medtronic device.  We will arrange for him to follow-up with Dr. Jesusita Oka in our office  6. Typical atrial flutter ([name]) -That is post  typical flutter ablation.  Not anticoagulation due to GI bleeding.  Disposition: Return in about 3 months (around 10/28/2019).  Medication Adjustments/Labs and Tests Ordered: Current medicines are reviewed at length with the patient today.  Concerns regarding medicines are outlined above.  Orders Placed This Encounter  Procedures  . Lipid panel  . EKG 12-Lead   Meds ordered this encounter  Medications  . rosuvastatin (CRESTOR) 20 MG tablet    Sig: Take 1 tablet (20 mg total) by mouth daily.    Dispense:  90 tablet    Refill:  3    Patient Instructions  Medication Instructions:  Start Crestor 20 mg daily   *If you need a refill on your cardiac medications before your next appointment, please call your pharmacy*   Lab Work: LIPID (fasting blood work, completed one week before 3 month follow up, no appointment for lab needed)  If you have labs (blood work) drawn today and your tests are completely normal, you will receive your results only by: [name] MyChart Message (if you have MyChart) OR . A paper copy in the mail If you have any lab test that is abnormal or we need to change your treatment, we will call you to review the results.   Follow-Up: At [facility], you and your health needs are our priority.  As part of our continuing mission to provide you with exceptional heart care, we have created designated Provider Care Teams.  These Care Teams include your primary Cardiologist (physician) and Advanced Practice Providers (APPs -  Physician Assistants  and Nurse Practitioners) who all work together to provide you with the care you need, when you need it.  We recommend signing up for the patient portal called "MyChart".  Sign up information is provided on this After Visit Summary.  MyChart is used to connect with patients for Virtual Visits (Telemedicine).  Patients are able to view lab/test results, encounter notes, upcoming appointments, etc.  Non-urgent messages can be sent to your  provider as well.   To learn more about what you can do with MyChart, go to [email or web address].    Your next appointment:   3 month(s)  The format for your next appointment:   Virtual Visit   Provider:   Eleonore Chiquito, MD   Other Instructions Needs appointment to see Dr.Croitoru as a new patient for device day.      Signed, Addison Naegeli. Audie Box, [place]  [address], [place], [place] [address] [phone]  07/28/2019 11:07 AM

## 2019-07-28 ENCOUNTER — Other Ambulatory Visit: Payer: Self-pay

## 2019-07-28 ENCOUNTER — Encounter: Payer: Self-pay | Admitting: Cardiovascular Disease

## 2019-07-28 ENCOUNTER — Ambulatory Visit: Payer: PPO | Admitting: Cardiovascular Disease

## 2019-07-28 VITALS — BP 116/82 | HR 63 | Ht 72.0 in | Wt 253.6 lb

## 2019-07-28 DIAGNOSIS — R9431 Abnormal electrocardiogram [ECG] [EKG]: Secondary | ICD-10-CM

## 2019-07-28 DIAGNOSIS — I495 Sick sinus syndrome: Secondary | ICD-10-CM | POA: Diagnosis not present

## 2019-07-28 DIAGNOSIS — I10 Essential (primary) hypertension: Secondary | ICD-10-CM

## 2019-07-28 DIAGNOSIS — E782 Mixed hyperlipidemia: Secondary | ICD-10-CM | POA: Diagnosis not present

## 2019-07-28 DIAGNOSIS — Z950 Presence of cardiac pacemaker: Secondary | ICD-10-CM

## 2019-07-28 DIAGNOSIS — I483 Typical atrial flutter: Secondary | ICD-10-CM | POA: Diagnosis not present

## 2019-07-28 MED ORDER — ROSUVASTATIN CALCIUM 20 MG PO TABS: 20.0000 mg | ORAL_TABLET | Freq: Every day | ORAL | 3 refills | Status: DC

## 2019-07-28 NOTE — Patient Instructions (Addendum)
Medication Instructions:  Start Crestor 20 mg daily   *If you need a refill on your cardiac medications before your next appointment, please call your pharmacy*   Lab Work: LIPID (fasting blood work, completed one week before 3 month follow up, no appointment for lab needed)  If you have labs (blood work) drawn today and your tests are completely normal, you will receive your results only by: [name] MyChart Message (if you have MyChart) OR . A paper copy in the mail If you have any lab test that is abnormal or we need to change your treatment, we will call you to review the results.   Follow-Up: At [facility], you and your health needs are our priority.  As part of our continuing mission to provide you with exceptional heart care, we have created designated Provider Care Teams.  These Care Teams include your primary Cardiologist (physician) and Advanced Practice Providers (APPs -  Physician Assistants and Nurse Practitioners) who all work together to provide you with the care you need, when you need it.  We recommend signing up for the patient portal called "MyChart".  Sign up information is provided on this After Visit Summary.  MyChart is used to connect with patients for Virtual Visits (Telemedicine).  Patients are able to view lab/test results, encounter notes, upcoming appointments, etc.  Non-urgent messages can be sent to your provider as well.   To learn more about what you can do with MyChart, go to [email or web address].    Your next appointment:   3 month(s)  The format for your next appointment:   Virtual Visit   Provider:   Eleonore Chiquito, MD   Other Instructions Needs appointment to see Dr.Croitoru as a new patient for device day.

## 2019-07-30 DIAGNOSIS — I10 Essential (primary) hypertension: Secondary | ICD-10-CM | POA: Diagnosis not present

## 2019-07-30 DIAGNOSIS — N139 Obstructive and reflux uropathy, unspecified: Secondary | ICD-10-CM | POA: Diagnosis not present

## 2019-07-30 DIAGNOSIS — R309 Painful micturition, unspecified: Secondary | ICD-10-CM | POA: Diagnosis not present

## 2019-07-30 DIAGNOSIS — E559 Vitamin D deficiency, unspecified: Secondary | ICD-10-CM | POA: Diagnosis not present

## 2019-07-30 DIAGNOSIS — I509 Heart failure, unspecified: Secondary | ICD-10-CM | POA: Diagnosis not present

## 2019-07-30 DIAGNOSIS — N183 Chronic kidney disease, stage 3 unspecified: Secondary | ICD-10-CM | POA: Diagnosis not present

## 2019-07-30 DIAGNOSIS — R809 Proteinuria, unspecified: Secondary | ICD-10-CM | POA: Diagnosis not present

## 2019-07-30 DIAGNOSIS — I110 Hypertensive heart disease with heart failure: Secondary | ICD-10-CM | POA: Diagnosis not present

## 2019-07-30 DIAGNOSIS — E669 Obesity, unspecified: Secondary | ICD-10-CM | POA: Diagnosis not present

## 2019-07-30 DIAGNOSIS — E785 Hyperlipidemia, unspecified: Secondary | ICD-10-CM | POA: Diagnosis not present

## 2019-08-02 DIAGNOSIS — H2513 Age-related nuclear cataract, bilateral: Secondary | ICD-10-CM | POA: Diagnosis not present

## 2019-08-02 DIAGNOSIS — H40033 Anatomical narrow angle, bilateral: Secondary | ICD-10-CM | POA: Diagnosis not present

## 2019-08-07 DIAGNOSIS — I4892 Unspecified atrial flutter: Secondary | ICD-10-CM | POA: Diagnosis not present

## 2019-08-07 DIAGNOSIS — K2270 Barrett's esophagus without dysplasia: Secondary | ICD-10-CM | POA: Diagnosis not present

## 2019-08-07 DIAGNOSIS — Z86010 Personal history of colonic polyps: Secondary | ICD-10-CM | POA: Diagnosis not present

## 2019-08-07 DIAGNOSIS — K861 Other chronic pancreatitis: Secondary | ICD-10-CM | POA: Diagnosis not present

## 2019-08-07 DIAGNOSIS — Z8719 Personal history of other diseases of the digestive system: Secondary | ICD-10-CM | POA: Diagnosis not present

## 2019-08-08 ENCOUNTER — Other Ambulatory Visit: Payer: Self-pay | Admitting: Gastroenterology

## 2019-08-08 ENCOUNTER — Encounter: Payer: PPO | Admitting: Internal Medicine

## 2019-08-08 DIAGNOSIS — Z8501 Personal history of malignant neoplasm of esophagus: Secondary | ICD-10-CM

## 2019-08-11 ENCOUNTER — Other Ambulatory Visit: Payer: Self-pay | Admitting: Gastroenterology

## 2019-08-11 DIAGNOSIS — Z8719 Personal history of other diseases of the digestive system: Secondary | ICD-10-CM

## 2019-08-12 DIAGNOSIS — Z950 Presence of cardiac pacemaker: Secondary | ICD-10-CM | POA: Diagnosis not present

## 2019-08-12 DIAGNOSIS — Z45018 Encounter for adjustment and management of other part of cardiac pacemaker: Secondary | ICD-10-CM | POA: Diagnosis not present

## 2019-08-12 DIAGNOSIS — I495 Sick sinus syndrome: Secondary | ICD-10-CM | POA: Diagnosis not present

## 2019-08-13 ENCOUNTER — Inpatient Hospital Stay: Admission: RE | Admit: 2019-08-13 | Payer: PPO | Source: Ambulatory Visit

## 2019-08-18 ENCOUNTER — Encounter: Payer: PPO | Admitting: Cardiovascular Disease

## 2019-08-21 ENCOUNTER — Other Ambulatory Visit: Payer: PPO

## 2019-08-22 ENCOUNTER — Telehealth: Payer: Self-pay | Admitting: Cardiovascular Disease

## 2019-08-22 ENCOUNTER — Other Ambulatory Visit: Payer: Self-pay | Admitting: Cardiology

## 2019-08-22 ENCOUNTER — Other Ambulatory Visit: Payer: Self-pay

## 2019-08-22 DIAGNOSIS — I10 Essential (primary) hypertension: Secondary | ICD-10-CM

## 2019-08-22 DIAGNOSIS — I130 Hypertensive heart and chronic kidney disease with heart failure and stage 1 through stage 4 chronic kidney disease, or unspecified chronic kidney disease: Secondary | ICD-10-CM

## 2019-08-22 MED ORDER — ATORVASTATIN CALCIUM 40 MG PO TABS: 40.0000 mg | ORAL_TABLET | Freq: Every day | ORAL | 3 refills | Status: DC

## 2019-08-22 NOTE — Telephone Encounter (Signed)
Switch to lipitor 40 mg daily.   [place] T. Audie Box, [place]  [address] [place], [place] [address] [phone]  4:17 PM

## 2019-08-22 NOTE — Telephone Encounter (Signed)
Contacted patient-  He states that since starting the Crestor- every time he takes it he breaks out in a rash for about 5-10 minutes and it goes away, but it happens every time he takes the medication.  I advised that I would send a message to MD to advise on how to proceed- patient states you can mychart him your recommendations.  Thanks!

## 2019-08-22 NOTE — Telephone Encounter (Signed)
Sent message via mychart regarding switch- Sent medication to pharmacy.

## 2019-08-22 NOTE — Telephone Encounter (Signed)
New message   Patient states that   rosuvastatin (CRESTOR) 20 MG tablet   This medication causes him to breakout and itch. Please advise.

## 2019-09-01 ENCOUNTER — Telehealth: Payer: Self-pay | Admitting: Cardiovascular Disease

## 2019-09-01 NOTE — Telephone Encounter (Signed)
Called and spoke with pt, he states that he has been falling because his feet and legs go numb and will burn as well. He states at times it feels like he has pulled a muscle. He reports having driven before and being unable to feel the gas or break pedals due to the numbness in his feet. He reports that he fell last year off the side walk and broke his arm due to this as well. He states that at times his hands will go numb as well and he will have swelling in his ankles. He reports that at times he will gain a lot of weight over night and at one point he went to see Dr.Ganji one day and went from 240-270 in the matter of a few days. He states this was all water weight and went away. Pt states that he was SOB yesterday morning but that he also has asthma and COPD and the pollen will irritate him he also states that yesterday he had a small episode of Chest pain but it passed quickly and denies any CP now.  Notified that if he begins to have the CP again or SOB that does not go away, to seek immediate evaluation in his nearest ED. Pt verbalized understanding with this. Notified I would send this message to Dr.O'Neal for review as well to see if he has any recommendation or referrals for the pts possible neuropathy.

## 2019-09-01 NOTE — Telephone Encounter (Signed)
Peter Marsh is calling stating his mother had neuropathy and he believes he is having early onset symptoms and would like for Dr. Audie Box to refer him to an office to be seen for this. Please advise.

## 2019-09-02 ENCOUNTER — Other Ambulatory Visit: Payer: Self-pay

## 2019-09-02 DIAGNOSIS — G629 Polyneuropathy, unspecified: Secondary | ICD-10-CM

## 2019-09-02 NOTE — Telephone Encounter (Signed)
Referral sent to [place] via epic.

## 2019-09-02 NOTE — Telephone Encounter (Signed)
Peter FreeArloa Marsh refer him to neurology for neuropathy.   -W

## 2019-09-08 ENCOUNTER — Other Ambulatory Visit: Payer: Self-pay

## 2019-09-08 ENCOUNTER — Encounter: Payer: Self-pay | Admitting: Neurology

## 2019-09-08 ENCOUNTER — Ambulatory Visit: Payer: PPO | Admitting: Neurology

## 2019-09-08 VITALS — BP 129/75 | HR 63 | Temp 96.9°F | Ht 72.0 in | Wt 257.0 lb

## 2019-09-08 DIAGNOSIS — G8929 Other chronic pain: Secondary | ICD-10-CM

## 2019-09-08 DIAGNOSIS — R202 Paresthesia of skin: Secondary | ICD-10-CM | POA: Diagnosis not present

## 2019-09-08 DIAGNOSIS — M5440 Lumbago with sciatica, unspecified side: Secondary | ICD-10-CM | POA: Diagnosis not present

## 2019-09-08 MED ORDER — GABAPENTIN 100 MG PO CAPS: 100.0000 mg | ORAL_CAPSULE | Freq: Three times a day (TID) | ORAL | 11 refills | Status: DC

## 2019-09-08 NOTE — Progress Notes (Signed)
PATIENT: Peter Marsh DOB: Oct 21, 1960  Chief Complaint  Patient presents with  . Peripheral Neuropathy    He has burning, tingling and pain in his bilateral legs and feet. He has never tried any medications for his symptoms.  . Cardiology    O'Neal, Cassie Freer, MD - referring provider  . PCP    Marda Stalker, PA-C     HISTORICAL  Peter Marsh is a 59 year old male, seen in request by his cardiologist Dr. Audie Box, Cassie Freer and primary care PA Marda Stalker for evaluation of bilateral feet paresthesia, initial evaluation was on Sep 08, 2019.  I have reviewed and summarized the referring note from the referring physician.  He has past medical history of hypertension, hyperlipidemia, status post pacemaker in 2018 by Dr. Lovena Le for high-grade AV block, paroxysmal atrial flutter, not on anticoagulation due to history of GI bleeding, he is also followed up by urologist for recurrent prostatitis, right ureteral narrowing, planning on procedure later this summer, he also had a long history of prediabetes, not on any medication for diabetes.  He reported long history of chronic low back pain, had a history of lumbar decompression surgery in the past, still complains of 8 out of 10 low back pain, radiating pain to bilateral calf, especially after prolonged walking, complains of bilateral calf muscle spasm,  Since 2020, he began to experience bilateral feet paresthesia, mainly involving plantar surface and toes, numbness, burning sensation, worsening after bearing weight, difficulty sleeping  He is also on polypharmacy treatment due to his mental health, carried a diagnosis of bipolar, schizophrenia  I personally reviewed MRI of the brain without contrast in July 2020, no acute abnormality, generalized atrophy with frontal and temporal predominance, mild supratentorium small vessel disease  CT angiogram of head and neck in July 2020 showed no large vessel disease.  I  reviewed the laboratory evaluation dated July 11, 2019, normal CBC, hemoglobin of 13.4, CMP, creatinine was elevated 1.84, estimated GFR of 38, lipase was 25  REVIEW OF SYSTEMS: Full 14 system review of systems performed and notable only for as above. All other review of systems were negative.  ALLERGIES: Allergies  Allergen Reactions  . Methylpyrrolidone Hives    froze the intestine  . Niacin Other (See Comments) and Nausea And Vomiting    Flushing, itching, tingling   . Norvasc [Amlodipine Besylate] Other (See Comments)    Swollen Feet  . Oxybutynin Chloride [Oxybutynin Chloride Er] Other (See Comments)    froze the intestine  . Vesicare [Solifenacin Succinate] Other (See Comments)    Froze the intestine   . Ciprofloxacin Rash and Other (See Comments)    Felt flushed   . Oxybutynin Rash  . Solifenacin Rash    HOME MEDICATIONS: Current Outpatient Medications  Medication Sig Dispense Refill  . acetaminophen (TYLENOL) 325 MG tablet Take 650 mg by mouth every 6 (six) hours as needed for mild pain or headache.    . albuterol (PROVENTIL HFA;VENTOLIN HFA) 108 (90 BASE) MCG/ACT inhaler Inhale 2 puffs into the lungs every 6 (six) hours as needed for wheezing or shortness of breath.     [name] atorvastatin (LIPITOR) 40 MG tablet Take 1 tablet (40 mg total) by mouth daily. 90 tablet 3  . benazepril (LOTENSIN) 10 MG tablet Take 1 tablet (10 mg total) by mouth daily. 90 tablet 1  . budesonide-formoterol (SYMBICORT) 80-4.5 MCG/ACT inhaler Inhale 2 puffs into the lungs 2 (two) times daily. 1 Inhaler 11  . buPROPion [facility]  SR) 150 MG 12 hr tablet Take 150 mg by mouth 2 (two) times daily.     [name] doxycycline (DORYX) 100 MG EC tablet Take 100 mg by mouth 2 (two) times daily.    . furosemide (LASIX) 40 MG tablet TAKE 1 TABLET(40 MG) BY MOUTH DAILY 30 tablet 2  . hydrALAZINE (APRESOLINE) 50 MG tablet Take 1 tablet (50 mg total) by mouth 3 (three) times daily. 270 tablet 2  . isosorbide dinitrate  (ISORDIL) 30 MG tablet TAKE 1 TABLET(30 MG) BY MOUTH FOUR TIMES DAILY 368 tablet 3  . metoprolol tartrate (LOPRESSOR) 100 MG tablet TAKE 1 TABLET BY MOUTH TWICE DAILY 180 tablet 0  . montelukast (SINGULAIR) 10 MG tablet Take 10 mg by mouth at bedtime.    . paliperidone (INVEGA) 6 MG 24 hr tablet Take 6 mg by mouth at bedtime.    . tamsulosin (FLOMAX) 0.4 MG CAPS capsule Take 0.4 mg by mouth daily.    . cloNIDine (CATAPRES) 0.2 MG tablet Take 1 tablet (0.2 mg total) by mouth 2 (two) times daily. 60 tablet 2  . pantoprazole (PROTONIX) 40 MG tablet Take 1 tablet (40 mg total) by mouth daily. 30 tablet 0   No current facility-administered medications for this visit.   Facility-Administered Medications Ordered in Other Visits  Medication Dose Route Frequency Provider Last Rate Last Admin  . acetaminophen (OFIRMEV) IVPB    PRN Lissa Morales, CRNA   1,000 mg at 04/19/11 0910  . ePHEDrine injection    PRN Lissa Morales, CRNA   5 mg at 04/19/11 1018  . glycopyrrolate (ROBINUL) injection    PRN Lissa Morales, CRNA   0.8 mg at 04/19/11 1037  . SUFentanil (SUFENTA) injection    PRN Lissa Morales, CRNA   10 mcg at 04/19/11 1008    PAST MEDICAL HISTORY: Past Medical History:  Diagnosis Date  . Asthma    uses inhalers   . Bilateral carotid bruits   . Cardiac conduction disorder 2018   s/p MDT PPM  . Chronic kidney disease    bladder interstial cystitis   . Chronic kidney disease (CKD), stage IV (severe) ([name])    follow[facility] by Dr. [facility] at [facility]  . Depression   . Encounter for care of pacemaker 02/13/2019  . GERD (gastroesophageal reflux disease)   . History of stomach ulcers 2001  . Hypertension   . LBBB (left bundle branch block)   . Lower extremity edema   . Mild intermittent asthma without complication   . Mixed hyperlipidemia   . Mobitz type 2 second degree AV block 04/06/2019  . Neuropathy   . Pacemaker: Medtronic Azure XT DR MRI [id number] PPM -  BUNDLE OF HIS pacing   02/26/2017   Scheduled Remote pacemaker check  11/12/2018:  There were 24 Fast AV episodes:  EGMs show SVTs. Episodes lasted < 2 minutes. Health trends do not demonstrate significant abnormality. Battery longevity is 9.4 - 10.3 years. RA pacing is 47.1 %, RV pacing is 40.4 %.  Clinic check 11/07/17.   . Paroxysmal atrial flutter ([place])   . PONV (postoperative nausea and vomiting)   . Prostatitis   . Schizophrenia ([place])   . Sinus node dysfunction ([place]) 02/13/2019    PAST SURGICAL HISTORY: Past Surgical History:  Procedure Laterality Date  . APPENDECTOMY    . ELECTROPHYSIOLOGY STUDY N/A 02/26/2017   Procedure: ELECTROPHYSIOLOGY STUDY;  Surgeon: Evans Lance, MD;  Location: [place] [facility];  Service: Cardiovascular;  Laterality: N/A;  . ESOPHAGOGASTRODUODENOSCOPY (EGD) WITH PROPOFOL N/A 09/18/2016   Procedure: ESOPHAGOGASTRODUODENOSCOPY (EGD) WITH PROPOFOL;  Surgeon: Ronnette Juniper, MD;  Location: [place];  Service: Gastroenterology;  Laterality: N/A;  . LUMBAR LAMINECTOMY/DECOMPRESSION MICRODISCECTOMY  04/19/2011   Procedure: LUMBAR LAMINECTOMY/DECOMPRESSION MICRODISCECTOMY;  Surgeon: Tobi Bastos;  Location: [facility];  Service: Orthopedics;  Laterality: Left;  Hemi LAminectomy/Microdiscectomy Lumbar four  - Lumbar five  on the Left (X-Ray)  . PACEMAKER IMPLANT N/A 02/26/2017   Procedure: PACEMAKER IMPLANT;  Surgeon: Evans Lance, MD;  Location: [place] [facility];  Service: Cardiovascular;  Laterality: N/A;  . TONSILLECTOMY      FAMILY HISTORY: Family History  Problem Relation Age of Onset  . High blood pressure Mother   . Alzheimer's disease Father     SOCIAL HISTORY: Social History   Socioeconomic History  . Marital status: Married    Spouse name: Not on file  . Number of children: 1  . Years of education: 61  . Highest education level: High school graduate  Occupational History  . Occupation: Retired  Tobacco Use  . Smoking status: Former Smoker    Packs/day:  1.00    Years: 30.00    Pack years: 30.00    Quit date: 05/08/2008    Years since quitting: 11.3  . Smokeless tobacco: Never Used  Substance and Sexual Activity  . Alcohol use: No  . Drug use: No  . Sexual activity: Never  Other Topics Concern  . Not on file  Social History Narrative   Lives at home with wife.   Right-handed.   One cup caffeine per day.   Social Determinants of Health   Financial Resource Strain:   . Difficulty of Paying Living Expenses:   Food Insecurity:   . Worried About Charity fundraiser in the Last Year:   . Arboriculturist in the Last Year:   Transportation Needs:   . Film/video editor (Medical):   [name] Lack of Transportation (Non-Medical):   Physical Activity:   . Days of Exercise per Week:   . Minutes of Exercise per Session:   Stress:   . Feeling of Stress :   Social Connections:   . Frequency of Communication with Friends and Family:   . Frequency of Social Gatherings with Friends and Family:   . Attends Religious Services:   . Active Member of Clubs or Organizations:   . Attends Archivist Meetings:   [name] Marital Status:   Intimate Partner Violence:   . Fear of Current or Ex-Partner:   . Emotionally Abused:   [name] Physically Abused:   . Sexually Abused:      PHYSICAL EXAM   Vitals:   09/08/19 0942  BP: 129/75  Pulse: 63  Temp: (!) 96.9 F (36.1 C)  Weight: 257 lb (116.6 kg)  Height: 6' (1.829 m)    Not recorded      Body mass index is 34.86 kg/m.  PHYSICAL EXAMNIATION:  Gen: NAD, conversant, well nourised, well groomed                     Cardiovascular: Regular rate rhythm, no peripheral edema, warm, nontender. Eyes: Conjunctivae clear without exudates or hemorrhage Neck: Supple, no carotid bruits. Pulmonary: Clear to auscultation bilaterally   NEUROLOGICAL EXAM:  MENTAL STATUS: Speech:    Speech is normal; fluent and spontaneous with normal comprehension.  Cognition:     Orientation to time, place and  person  Normal recent and remote memory     Normal Attention span and concentration     Normal Language, naming, repeating,spontaneous speech     Fund of knowledge   CRANIAL NERVES: CN II: Visual fields are full to confrontation. Pupils are round equal and briskly reactive to light. CN III, IV, VI: extraocular movement are normal. No ptosis. CN V: Facial sensation is intact to light touch CN VII: Face is symmetric with normal eye closure  CN VIII: Hearing is normal to causal conversation. CN IX, X: Phonation is normal. CN XI: Head turning and shoulder shrug are intact  MOTOR: There is no pronator drift of out-stretched arms. Muscle bulk and tone are normal. Muscle strength is normal.  Variable effort on examination, tends to hold left first toe in flexed position, but felt to be no muscle weakness    REFLEXES: Reflexes are 2+ and symmetric at the biceps, triceps, knees, and absent at ankles. Plantar responses are flexor.  SENSORY: Mildly length dependent decreased vibratory sensation to ankle level, mildly decreased light touch, pinprick to mid foot level  COORDINATION: There is no trunk or limb dysmetria noted.  GAIT/STANCE: He can get up from seated position arms crossed, mildly antalgic, also limited by his big body habitus, able to stand up on tiptoe, heels, Romberg sign was negative   DIAGNOSTIC DATA (LABS, IMAGING, TESTING) - I reviewed patient records, labs, notes, testing and imaging myself where available.   ASSESSMENT AND PLAN  Peter Marsh is a 59 y.o. male   A year history of gradual onset bilateral feet paresthesia  Mildly length dependent sensory changes on examination, absent ankle reflexes  Differentiation diagnosis include peripheral neuropathy, lumbar radiculopathy  Mild MRI candidate due to pacemaker, long history of chronic low back pain  Reported long history of prediabetic, chronic kidney disease,  Laboratory evaluation for potential etiology  of peripheral neuropathy  EMG nerve conduction study  Low-dose gabapentin 100 mg 3 times daily   Marcial Pacas, M.D. Ph.D.  [facility] [address], [place], [place] [address] Ph: [phone] Fax: [phone]  CC: Geralynn Rile, MD, Marda Stalker, PA-C

## 2019-09-09 ENCOUNTER — Telehealth: Payer: Self-pay | Admitting: *Deleted

## 2019-09-09 NOTE — Telephone Encounter (Signed)
-----   Message from Sanda Klein, MD sent at 09/09/2019 11:09 AM EDT -----   ----- Message ----- From: Marcial Pacas, MD Sent: 09/08/2019  10:36 AM EDT To: Sanda Klein, MD

## 2019-09-09 NOTE — Telephone Encounter (Signed)
Received request from Dr. Sallyanne Kuster to transfer patient's Carelink monitoring to [facility] per patient request. Spoke with patient and confirmed that he wishes to have PPM followed by [facility]. He has an appointment to re-establish care with Dr. Lovena Le on 09/12/19. Carelink transfer requested. Pt appreciative of call and assistance.

## 2019-09-09 NOTE — Progress Notes (Signed)
Wes, his pacemaker system is MRI conditional and he can have the scan with the appropriate reprogramming. We just need to know date, time and location to communicate with the Medtr[employer] rep. We also need to get him hooked back into our device clinic if he plans to follow up w [facility]. Will ask the device clinic to do that. Thanks!

## 2019-09-09 NOTE — Telephone Encounter (Deleted)
-----   Message from Sanda Klein, MD sent at 09/09/2019 11:09 AM EDT -----   ----- Message ----- From: Marcial Pacas, MD Sent: 09/08/2019  10:36 AM EDT To: Sanda Klein, MD

## 2019-09-10 ENCOUNTER — Telehealth: Payer: Self-pay | Admitting: Neurology

## 2019-09-10 LAB — ANA W/REFLEX: Anti Nuclear Antibody (ANA): NEGATIVE

## 2019-09-10 LAB — MULTIPLE MYELOMA PANEL, SERUM
Albumin SerPl Elph-Mcnc: 3.3 g/dL (ref 2.9–4.4)
Albumin/Glob SerPl: 1.2 (ref 0.7–1.7)
Alpha 1: 0.3 g/dL (ref 0.0–0.4)
Alpha2 Glob SerPl Elph-Mcnc: 0.8 g/dL (ref 0.4–1.0)
B-Globulin SerPl Elph-Mcnc: 1.1 g/dL (ref 0.7–1.3)
Gamma Glob SerPl Elph-Mcnc: 0.9 g/dL (ref 0.4–1.8)
Globulin, Total: 3 g/dL (ref 2.2–3.9)
IgA/Immunoglobulin A, Serum: 72 mg/dL — ABNORMAL LOW (ref 90–386)
IgG (Immunoglobin G), Serum: 902 mg/dL (ref 603–1613)
IgM (Immunoglobulin M), Srm: 70 mg/dL (ref 20–172)
Total Protein: 6.3 g/dL (ref 6.0–8.5)

## 2019-09-10 LAB — TSH: TSH: 1.14 u[IU]/mL (ref 0.450–4.500)

## 2019-09-10 LAB — HEMOGLOBIN A1C
Est. average glucose Bld gHb Est-mCnc: 111 mg/dL
Hgb A1c MFr Bld: 5.5 % (ref 4.8–5.6)

## 2019-09-10 LAB — SEDIMENTATION RATE: Sed Rate: 9 mm/hr (ref 0–30)

## 2019-09-10 LAB — HIV ANTIBODY (ROUTINE TESTING W REFLEX): HIV Screen 4th Generation wRfx: NONREACTIVE

## 2019-09-10 LAB — C-REACTIVE PROTEIN: CRP: 14 mg/L — ABNORMAL HIGH (ref 0–10)

## 2019-09-10 LAB — VITAMIN B12: Vitamin B-12: 196 pg/mL — ABNORMAL LOW (ref 232–1245)

## 2019-09-10 NOTE — Telephone Encounter (Signed)
Please call patient, extensive laboratory evaluation showed mildly decreased IgA 72, mild elevated C-reactive protein 14, above abnormal findings has unknown clinical significance, will consider repeat IgA level again later  There is evidence of B12 deficiency, level was 196, he should come in for repeat B12 level, homocystine, folic acid, methylmalonic acid, and also B12 IM supplement after repeating test

## 2019-09-11 ENCOUNTER — Other Ambulatory Visit: Payer: Self-pay | Admitting: *Deleted

## 2019-09-11 DIAGNOSIS — R799 Abnormal finding of blood chemistry, unspecified: Secondary | ICD-10-CM

## 2019-09-11 DIAGNOSIS — E539 Vitamin B deficiency, unspecified: Secondary | ICD-10-CM

## 2019-09-11 NOTE — Telephone Encounter (Signed)
I spoke to the patient and provided him with his results. He will come to the office for repeat labs. He declined injections. It is difficult to get to the office that often. Per vo by Dr. Krista Blue, he should start an oral B12 supplement of 1048mcg per day. He is agreeable to start the OTC tablets. He verbalized understanding to start them after he gets his additional labs drawn.

## 2019-09-12 ENCOUNTER — Ambulatory Visit: Payer: PPO | Admitting: Internal Medicine

## 2019-09-12 ENCOUNTER — Other Ambulatory Visit: Payer: Self-pay

## 2019-09-12 ENCOUNTER — Encounter: Payer: Self-pay | Admitting: Internal Medicine

## 2019-09-12 VITALS — BP 170/98 | HR 83 | Ht 73.0 in | Wt 254.0 lb

## 2019-09-12 DIAGNOSIS — I441 Atrioventricular block, second degree: Secondary | ICD-10-CM | POA: Diagnosis not present

## 2019-09-12 DIAGNOSIS — I10 Essential (primary) hypertension: Secondary | ICD-10-CM

## 2019-09-12 DIAGNOSIS — Z950 Presence of cardiac pacemaker: Secondary | ICD-10-CM

## 2019-09-12 NOTE — Patient Instructions (Signed)
Medication Instructions:  Your physician recommends that you continue on your current medications as directed. Please refer to the Current Medication list given to you today.  Labwork: None ordered.  Testing/Procedures: None ordered.  Follow-Up: Your physician wants you to follow-up in: one year with Dr. Lovena Le.   You will receive a reminder letter in the mail two months in advance. If you don't receive a letter, please call our office to schedule the follow-up appointment.  Remote monitoring is used to monitor your Pacemaker from home.   Device clinic [phone]  Any Other Special Instructions Will Be Listed Below (If Applicable).  If you need a refill on your cardiac medications before your next appointment, please call your pharmacy.

## 2019-09-12 NOTE — Progress Notes (Signed)
HPI [name] returns today for followup. He is a pleasant 59 yo man with conduction system disease and a wide QRS tachy who underwent PPM insertion almost 3 years ago. He has done well in the interim with no chest pain or sob. Minimal peripheral edema. He denies syncope or peripheral edema. He has had a single episode of probable SVT for a minute.  Allergies  Allergen Reactions  . Methylpyrrolidone Hives    froze the intestine  . Niacin Other (See Comments) and Nausea And Vomiting    Flushing, itching, tingling   . Norvasc [Amlodipine Besylate] Other (See Comments)    Swollen Feet  . Oxybutynin Chloride [Oxybutynin Chloride Er] Other (See Comments)    froze the intestine  . Vesicare [Solifenacin Succinate] Other (See Comments)    Froze the intestine   . Ciprofloxacin Rash and Other (See Comments)    Felt flushed   . Oxybutynin Rash  . Solifenacin Rash     Current Outpatient Medications  Medication Sig Dispense Refill  . acetaminophen (TYLENOL) 325 MG tablet Take 650 mg by mouth every 6 (six) hours as needed for mild pain or headache.    . albuterol (PROVENTIL HFA;VENTOLIN HFA) 108 (90 BASE) MCG/ACT inhaler Inhale 2 puffs into the lungs every 6 (six) hours as needed for wheezing or shortness of breath.     [name] atorvastatin (LIPITOR) 40 MG tablet Take 1 tablet (40 mg total) by mouth daily. 90 tablet 3  . benazepril (LOTENSIN) 10 MG tablet Take 1 tablet (10 mg total) by mouth daily. 90 tablet 1  . budesonide-formoterol (SYMBICORT) 80-4.5 MCG/ACT inhaler Inhale 2 puffs into the lungs 2 (two) times daily. 1 Inhaler 11  . buPROPion (WELLBUTRIN SR) 150 MG 12 hr tablet Take 150 mg by mouth 2 (two) times daily.     [name] doxycycline (DORYX) 100 MG EC tablet Take 100 mg by mouth 2 (two) times daily.    . fluticasone (FLONASE) 50 MCG/ACT nasal spray Place 1 spray into both nostrils daily.    . furosemide (LASIX) 40 MG tablet TAKE 1 TABLET(40 MG) BY MOUTH DAILY 30 tablet 2  . gabapentin  (NEURONTIN) 100 MG capsule Take 1 capsule (100 mg total) by mouth 3 (three) times daily. 90 capsule 11  . hydrALAZINE (APRESOLINE) 50 MG tablet Take 1 tablet (50 mg total) by mouth 3 (three) times daily. 270 tablet 2  . isosorbide dinitrate (ISORDIL) 30 MG tablet TAKE 1 TABLET(30 MG) BY MOUTH FOUR TIMES DAILY 368 tablet 3  . metoprolol tartrate (LOPRESSOR) 100 MG tablet TAKE 1 TABLET BY MOUTH TWICE DAILY 180 tablet 0  . montelukast (SINGULAIR) 10 MG tablet Take 10 mg by mouth at bedtime.    . paliperidone (INVEGA) 6 MG 24 hr tablet Take 6 mg by mouth at bedtime.    . tamsulosin (FLOMAX) 0.4 MG CAPS capsule Take 0.4 mg by mouth daily.    . cloNIDine (CATAPRES) 0.2 MG tablet Take 1 tablet (0.2 mg total) by mouth 2 (two) times daily. 60 tablet 2  . pantoprazole (PROTONIX) 40 MG tablet Take 1 tablet (40 mg total) by mouth daily. 30 tablet 0   No current facility-administered medications for this visit.   Facility-Administered Medications Ordered in Other Visits  Medication Dose Route Frequency Provider Last Rate Last Admin  . acetaminophen (OFIRMEV) IVPB    PRN Lissa Morales, CRNA   1,000 mg at 04/19/11 0910  . ePHEDrine injection    PRN Enrigue Catena  E, CRNA   5 mg at 04/19/11 1018  . glycopyrrolate (ROBINUL) injection    PRN Lissa Morales, CRNA   0.8 mg at 04/19/11 1037  . SUFentanil (SUFENTA) injection    PRN Lissa Morales, CRNA   10 mcg at 04/19/11 1008     Past Medical History:  Diagnosis Date  . Asthma    uses inhalers   . Bilateral carotid bruits   . Cardiac conduction disorder 2018   s/p MDT PPM  . Chronic kidney disease    bladder interstial cystitis   . Chronic kidney disease (CKD), stage IV (severe) ([name])    follow[facility] by Dr[facility] Jo[facility] at [facility]  . Depression   . Encounter for care of pacemaker 02/13/2019  . GERD (gastroesophageal reflux disease)   . History of stomach ulcers 2001  . Hypertension   . LBBB (left bundle branch block)   . Lower extremity edema   .  Mild intermittent asthma without complication   . Mixed hyperlipidemia   . Mobitz type 2 second degree AV block 04/06/2019  . Neuropathy   . Pacemaker: Medtronic Azure XT DR MRI [id number] PPM -  BUNDLE OF HIS pacing  02/26/2017   Scheduled Remote pacemaker check  11/12/2018:  There were 24 Fast AV episodes:  EGMs show SVTs. Episodes lasted < 2 minutes. Health trends do not demonstrate significant abnormality. Battery longevity is 9.4 - 10.3 years. RA pacing is 47.1 %, RV pacing is 40.4 %.  Clinic check 11/07/17.   . Paroxysmal atrial flutter ([place])   . PONV (postoperative nausea and vomiting)   . Prostatitis   . Schizophrenia ([place])   . Sinus node dysfunction ([place]) 02/13/2019    ROS:   All systems reviewed and negative except as noted in the HPI.   Past Surgical History:  Procedure Laterality Date  . APPENDECTOMY    . ELECTROPHYSIOLOGY STUDY N/A 02/26/2017   Procedure: ELECTROPHYSIOLOGY STUDY;  Surgeon: Evans Lance, MD;  Location: [place] [facility];  Service: Cardiovascular;  Laterality: N/A;  . ESOPHAGOGASTRODUODENOSCOPY (EGD) WITH PROPOFOL N/A 09/18/2016   Procedure: ESOPHAGOGASTRODUODENOSCOPY (EGD) WITH PROPOFOL;  Surgeon: Ronnette Juniper, MD;  Location: [place] [facility];  Service: Gastroenterology;  Laterality: N/A;  . LUMBAR LAMINECTOMY/DECOMPRESSION MICRODISCECTOMY  04/19/2011   Procedure: LUMBAR LAMINECTOMY/DECOMPRESSION MICRODISCECTOMY;  Surgeon: Tobi Bastos;  Location[facility];  Service: Orthopedics;  Laterality: Left;  Hemi LAminectomy/Microdiscectomy Lumbar four  - Lumbar five  on the Left (X-Ray)  . PACEMAKER IMPLANT N/A 02/26/2017   Procedure: PACEMAKER IMPLANT;  Surgeon: Evans Lance, MD;  Location: [place] [facility];  Service: Cardiovascular;  Laterality: N/A;  . TONSILLECTOMY       Family History  Problem Relation Age of Onset  . High blood pressure Mother   . Alzheimer's disease Father      Social History   Socioeconomic History  . Marital status: Married     Spouse name: Not on file  . Number of children: 1  . Years of education: 61  . Highest education level: High school graduate  Occupational History  . Occupation: Retired  Tobacco Use  . Smoking status: Former Smoker    Packs/day: 1.00    Years: 30.00    Pack years: 30.00    Quit date: 05/08/2008    Years since quitting: 11.3  . Smokeless tobacco: Never Used  Substance and Sexual Activity  . Alcohol use: No  . Drug use: No  . Sexual activity: Never  Other Topics  Concern  . Not on file  Social History Narrative   Lives at home with wife.   Right-handed.   One cup caffeine per day.   Social Determinants of Health   Financial Resource Strain:   . Difficulty of Paying Living Expenses:   Food Insecurity:   . Worried About Charity fundraiser in the Last Year:   . Arboriculturist in the Last Year:   Transportation Needs:   . Film/video editor (Medical):   [name] Lack of Transportation (Non-Medical):   Physical Activity:   . Days of Exercise per Week:   . Minutes of Exercise per Session:   Stress:   . Feeling of Stress :   Social Connections:   . Frequency of Communication with Friends and Family:   . Frequency of Social Gatherings with Friends and Family:   . Attends Religious Services:   . Active Member of Clubs or Organizations:   . Attends Archivist Meetings:   [name] Marital Status:   Intimate Partner Violence:   . Fear of Current or Ex-Partner:   . Emotionally Abused:   [name] Physically Abused:   . Sexually Abused:      BP (!) 170/98   Pulse 83   Ht 6\' 1"  (1.854 m)   Wt 254 lb (115.2 kg)   SpO2 96%   BMI 33.51 kg/m   Physical Exam:  Well appearing NAD HEENT: Unremarkable Neck:  No JVD, no thyromegally Lymphatics:  No adenopathy Back:  No CVA tenderness Lungs:  Clear with no wheezes HEART:  Regular rate rhythm, no murmurs, no rubs, no clicks Abd:  soft, positive bowel sounds, no organomegally, no rebound, no guarding Ext:  2 plus pulses, no edema,  no cyanosis, no clubbing Skin:  No rashes no nodules Neuro:  CN II through XII intact, motor grossly intact   DEVICE  Normal device function.  See PaceArt for details.   Assess/Plan: 1. [place] syncope - he has had no recurrent syncope since his PPM insertion.  2. PPM - his medtronic DDD PM is working normally.  3. Wide qrs tachy - he has had a single episode since his last check. Looks like SVT with abherrancy 4. Dyslipidemia - he will continue his statin therapy.   Mikle [email or web address].D

## 2019-09-15 ENCOUNTER — Other Ambulatory Visit: Payer: Self-pay

## 2019-09-15 ENCOUNTER — Other Ambulatory Visit: Payer: Self-pay | Admitting: Cardiovascular Disease

## 2019-09-15 ENCOUNTER — Other Ambulatory Visit (INDEPENDENT_AMBULATORY_CARE_PROVIDER_SITE_OTHER): Payer: Self-pay

## 2019-09-15 ENCOUNTER — Telehealth: Payer: Self-pay | Admitting: Cardiovascular Disease

## 2019-09-15 DIAGNOSIS — R799 Abnormal finding of blood chemistry, unspecified: Secondary | ICD-10-CM

## 2019-09-15 DIAGNOSIS — E539 Vitamin B deficiency, unspecified: Secondary | ICD-10-CM | POA: Diagnosis not present

## 2019-09-15 DIAGNOSIS — Z0289 Encounter for other administrative examinations: Secondary | ICD-10-CM

## 2019-09-15 MED ORDER — ATORVASTATIN CALCIUM 40 MG PO TABS: 40.0000 mg | ORAL_TABLET | Freq: Every day | ORAL | 3 refills | Status: DC

## 2019-09-15 NOTE — Telephone Encounter (Signed)
Pt c/o medication issue:  1. Name of Medication: Rosuvastatin  2. How are you currently taking this medication (dosage and times per day)? n/a  3. Are you having a reaction (difficulty breathing--STAT)? No  4. What is your medication issue? Patient was taken off this medication due to an allergic reaction. He states there was talk of going on another medication but he has not heard anything else about it since. Please advise.

## 2019-09-15 NOTE — Telephone Encounter (Signed)
The patient was calling to have the Atorvastatin 40 mg sent into [facility]. This has been refilled.

## 2019-09-15 NOTE — Telephone Encounter (Signed)
*  STAT* If patient is at the pharmacy, call can be transferred to refill team.   1. Which medications need to be refilled? (please list name of each medication and dose if known)   cloNIDine (CATAPRES) 0.2 MG tablet  tamsulosin (FLOMAX) 0.4 MG CAPS capsule   2. Which pharmacy/location (including street and city if local pharmacy) is medication to be sent to? [place], [place] - [address] AT [place]  3. Do they need a 30 day or 90 day supply? [place]

## 2019-09-16 ENCOUNTER — Telehealth: Payer: Self-pay | Admitting: Neurology

## 2019-09-16 NOTE — Telephone Encounter (Signed)
Author: Sanda Klein, MD Service: Cardiology Author Type: Physician  Filed: 09/09/2019 11:11 AM Encounter Date: 09/08/2019 Status: Signed  Editor: Sanda Klein, MD (Physician)     Show:Clear all [x] Manual[] Template[] Copied  Added by: [x] Croitoru, Matilde Bash, MD  [] Hover for details Wes, his pacemaker system is MRI conditional and he can have the scan with the appropriate reprogramming. We just need to know date, time and location to communicate with the [employer] rep. We also need to get him hooked back into our device clinic if he plans to follow up w [facility]. Will ask the device clinic to do that. Thanks!

## 2019-09-17 DIAGNOSIS — R102 Pelvic and perineal pain: Secondary | ICD-10-CM | POA: Diagnosis not present

## 2019-09-17 DIAGNOSIS — D3002 Benign neoplasm of left kidney: Secondary | ICD-10-CM | POA: Diagnosis not present

## 2019-09-17 MED ORDER — CLONIDINE HCL 0.2 MG PO TABS: 0.2000 mg | ORAL_TABLET | Freq: Two times a day (BID) | ORAL | 2 refills | Status: DC

## 2019-09-17 MED ORDER — TAMSULOSIN HCL 0.4 MG PO CAPS: 0.4000 mg | ORAL_CAPSULE | Freq: Every day | ORAL | 6 refills | Status: DC

## 2019-09-18 ENCOUNTER — Other Ambulatory Visit: Payer: Self-pay

## 2019-09-18 ENCOUNTER — Emergency Department (HOSPITAL_COMMUNITY)
Admission: EM | Admit: 2019-09-18 | Discharge: 2019-09-18 | Disposition: A | Discharge status: Home / Self Care | Payer: PPO | Attending: Emergency Medicine | Admitting: Emergency Medicine

## 2019-09-18 ENCOUNTER — Encounter (HOSPITAL_COMMUNITY): Payer: Self-pay

## 2019-09-18 DIAGNOSIS — Z87891 Personal history of nicotine dependence: Secondary | ICD-10-CM | POA: Insufficient documentation

## 2019-09-18 DIAGNOSIS — J45909 Unspecified asthma, uncomplicated: Secondary | ICD-10-CM | POA: Diagnosis not present

## 2019-09-18 DIAGNOSIS — R112 Nausea with vomiting, unspecified: Secondary | ICD-10-CM | POA: Insufficient documentation

## 2019-09-18 DIAGNOSIS — Z950 Presence of cardiac pacemaker: Secondary | ICD-10-CM | POA: Insufficient documentation

## 2019-09-18 DIAGNOSIS — N184 Chronic kidney disease, stage 4 (severe): Secondary | ICD-10-CM | POA: Diagnosis not present

## 2019-09-18 DIAGNOSIS — Z79899 Other long term (current) drug therapy: Secondary | ICD-10-CM | POA: Insufficient documentation

## 2019-09-18 DIAGNOSIS — I129 Hypertensive chronic kidney disease with stage 1 through stage 4 chronic kidney disease, or unspecified chronic kidney disease: Secondary | ICD-10-CM | POA: Insufficient documentation

## 2019-09-18 LAB — URINALYSIS, ROUTINE W REFLEX MICROSCOPIC
Bacteria, UA: NONE SEEN
Bilirubin Urine: NEGATIVE
Glucose, UA: NEGATIVE mg/dL
Hgb urine dipstick: NEGATIVE
Ketones, ur: NEGATIVE mg/dL
Leukocytes,Ua: NEGATIVE
Nitrite: NEGATIVE
Protein, ur: 100 mg/dL — AB
Specific Gravity, Urine: 1.013 (ref 1.005–1.030)
pH: 7 (ref 5.0–8.0)

## 2019-09-18 LAB — COMPREHENSIVE METABOLIC PANEL
ALT: 15 U/L (ref 0–44)
AST: 22 U/L (ref 15–41)
Albumin: 3.9 g/dL (ref 3.5–5.0)
Alkaline Phosphatase: 59 U/L (ref 38–126)
Anion gap: 9 (ref 5–15)
BUN: 18 mg/dL (ref 6–20)
CO2: 25 mmol/L (ref 22–32)
Calcium: 9 mg/dL (ref 8.9–10.3)
Chloride: 103 mmol/L (ref 98–111)
Creatinine, Ser: 2.1 mg/dL — ABNORMAL HIGH (ref 0.61–1.24)
GFR calc Af Amer: 38 mL/min — ABNORMAL LOW (ref >60)
GFR calc non Af Amer: 33 mL/min — ABNORMAL LOW (ref >60)
Glucose, Bld: 122 mg/dL — ABNORMAL HIGH (ref 70–99)
Potassium: 4.8 mmol/L (ref 3.5–5.1)
Sodium: 137 mmol/L (ref 135–145)
Total Bilirubin: 1 mg/dL (ref 0.3–1.2)
Total Protein: 6.8 g/dL (ref 6.5–8.1)

## 2019-09-18 LAB — CBC
HCT: 42.3 % (ref 39.0–52.0)
Hemoglobin: 13.2 g/dL (ref 13.0–17.0)
MCH: 28.1 pg (ref 26.0–34.0)
MCHC: 31.2 g/dL (ref 30.0–36.0)
MCV: 90.2 fL (ref 80.0–100.0)
Platelets: 239 10*3/uL (ref 150–400)
RBC: 4.69 MIL/uL (ref 4.22–5.81)
RDW: 13 % (ref 11.5–15.5)
WBC: 7 10*3/uL (ref 4.0–10.5)
nRBC: 0 % (ref 0.0–0.2)

## 2019-09-18 LAB — LIPASE, BLOOD: Lipase: 27 U/L (ref 11–51)

## 2019-09-18 MED ORDER — CLONIDINE HCL 0.2 MG PO TABS
0.2000 mg | ORAL_TABLET | Freq: Two times a day (BID) | ORAL | Status: DC
  Administered 2019-09-18: 0.2 mg via ORAL
  Filled 2019-09-18: qty 1

## 2019-09-18 MED ORDER — SODIUM CHLORIDE 0.9 % IV BOLUS
1000.0000 mL | Freq: Once | INTRAVENOUS | Status: AC
  Administered 2019-09-18: 1000 mL via INTRAVENOUS

## 2019-09-18 MED ORDER — ONDANSETRON 4 MG PO TBDP
4.0000 mg | ORAL_TABLET | Freq: Three times a day (TID) | ORAL | 0 refills | Status: DC | PRN
Start: 2019-09-18 — End: 2020-01-19

## 2019-09-18 MED ORDER — HYDRALAZINE HCL 25 MG PO TABS
50.0000 mg | ORAL_TABLET | Freq: Three times a day (TID) | ORAL | Status: DC
  Administered 2019-09-18: 50 mg via ORAL
  Filled 2019-09-18: qty 2

## 2019-09-18 MED ORDER — ONDANSETRON HCL 4 MG/2ML IJ SOLN
4.0000 mg | Freq: Once | INTRAMUSCULAR | Status: AC
  Administered 2019-09-18: 4 mg via INTRAVENOUS
  Filled 2019-09-18: qty 2

## 2019-09-18 MED ORDER — SODIUM CHLORIDE 0.9% FLUSH
3.0000 mL | Freq: Once | INTRAVENOUS | Status: AC
  Administered 2019-09-18: 3 mL via INTRAVENOUS

## 2019-09-18 MED ORDER — BENAZEPRIL HCL 5 MG PO TABS
10.0000 mg | ORAL_TABLET | Freq: Every day | ORAL | Status: DC
  Administered 2019-09-18: 10 mg via ORAL
  Filled 2019-09-18: qty 2

## 2019-09-18 NOTE — ED Provider Notes (Signed)
[facility] EMERGENCY DEPARTMENT Provider Note   CSN: [id number] Arrival date & time: 09/18/19  6734     History Chief Complaint  Patient presents with  . Emesis    Peter Marsh is a 59 y.o. male.  59 yo M with a chief complaint of nausea vomiting and diarrhea.  Going on for the past 3 to 4 days.  Patient is also had a fever, subjectively.  States that he has felt hot and cold.  He went to his urologist yesterday because last time he felt this way he had prostatitis.  He tells me that he was evaluated there and it was thought to not be prostatitis.  Patient continued to have nausea vomiting and diarrhea and so decided to come to the ED this morning.  Denies abdominal pain.  Denies suspicious food intake.  Denies sick contact.  Denies cough or congestion.  Has had both of his Covid vaccine's last one was more than a month ago.  The history is provided by the patient.  Emesis Severity:  Moderate Duration:  4 days Timing:  Constant Able to tolerate:  Liquids Progression:  Unchanged Chronicity:  New Recent urination:  Normal Relieved by:  Nothing Worsened by:  Nothing Ineffective treatments:  None tried Associated symptoms: diarrhea and fever (subjective)   Associated symptoms: no abdominal pain, no arthralgias, no chills, no headaches and no myalgias        Past Medical History:  Diagnosis Date  . Asthma    uses inhalers   . Bilateral carotid bruits   . Cardiac conduction disorder 2018   s/p MDT PPM  . Chronic kidney disease    bladder interstial cystitis   . Chronic kidney disease (CKD), stage IV (severe) ([name])    follow[facility] by Dr[facility]  . Depression   . Encounter for care of pacemaker 02/13/2019  . GERD (gastroesophageal reflux disease)   . History of stomach ulcers 2001  . Hypertension   . LBBB (left bundle branch block)   . Lower extremity edema   . Mild intermittent asthma without complication   . Mixed hyperlipidemia     . Mobitz type 2 second degree AV block 04/06/2019  . Neuropathy   . Pacemaker: Medtronic Azure XT DR MRI [id number] PPM -  BUNDLE OF HIS pacing  02/26/2017   Scheduled Remote pacemaker check  11/12/2018:  There were 24 Fast AV episodes:  EGMs show SVTs. Episodes lasted < 2 minutes. Health trends do not demonstrate significant abnormality. Battery longevity is 9.4 - 10.3 years. RA pacing is 47.1 %, RV pacing is 40.4 %.  Clinic check 11/07/17.   . Paroxysmal atrial flutter ([place])   . PONV (postoperative nausea and vomiting)   . Prostatitis   . Schizophrenia ([place])   . Sinus node dysfunction ([place]) 02/13/2019    Patient Active Problem List   Diagnosis Date Noted  . Paresthesia 09/08/2019  . Chronic low back pain with sciatica 09/08/2019  . Mobitz type 2 second degree AV block 04/06/2019  . Encounter for care of pacemaker 02/13/2019  . Sinus node dysfunction ([place]) 02/13/2019  . Encephalopathy 11/19/2018  . GERD (gastroesophageal reflux disease) 11/19/2018  . Essential hypertension 06/24/2018  . Wide QRS ventricular tachycardia ([place]) 02/26/2017  . 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 CKD (chronic kidney disease), stage III ([place]) 09/16/2016  . Schizophrenia ([place]) 09/16/2016  . Upper GI bleed 09/16/2016  .  Asthma 02/09/2016  . Herniated lumbar intervertebral disc 04/20/2011    Past Surgical History:  Procedure Laterality Date  . APPENDECTOMY    . ELECTROPHYSIOLOGY STUDY N/A 02/26/2017   Procedure: ELECTROPHYSIOLOGY STUDY;  Surgeon: Evans Lance, MD;  Location: [facility];  Service: Cardiovascular;  Laterality: N/A;  . ESOPHAGOGASTRODUODENOSCOPY (EGD) WITH PROPOFOL N/A 09/18/2016   Procedure: ESOPHAGOGASTRODUODENOSCOPY (EGD) WITH PROPOFOL;  Surgeon: Ronnette Juniper, MD;  Location: [facility];  Service: Gastroenterology;  Laterality: N/A;  . LUMBAR LAMINECTOMY/DECOMPRESSION MICRODISCECTOMY  04/19/2011   Procedure: LUMBAR  LAMINECTOMY/DECOMPRESSION MICRODISCECTOMY;  Surgeon: Tobi Bastos;  Location: [facility];  Service: Orthopedics;  Laterality: Left;  Hemi LAminectomy/Microdiscectomy Lumbar four  - Lumbar five  on the Left (X-Ray)  . PACEMAKER IMPLANT N/A 02/26/2017   Procedure: PACEMAKER IMPLANT;  Surgeon: Evans Lance, MD;  Location: [facility];  Service: Cardiovascular;  Laterality: N/A;  . TONSILLECTOMY         Family History  Problem Relation Age of Onset  . High blood pressure Mother   . Alzheimer's disease Father     Social History   Tobacco Use  . Smoking status: Former Smoker    Packs/day: 1.00    Years: 30.00    Pack years: 30.00    Quit date: 05/08/2008    Years since quitting: 11.3  . Smokeless tobacco: Never Used  Substance Use Topics  . Alcohol use: No  . Drug use: No    Home Medications Prior to Admission medications   Medication Sig Start Date End Date Taking? Authorizing Provider  acetaminophen (TYLENOL) 325 MG tablet Take 650 mg by mouth every 6 (six) hours as needed for mild pain or headache.    [provider]  albuterol (PROVENTIL HFA;VENTOLIN HFA) 108 (90 BASE) MCG/ACT inhaler Inhale 2 puffs into the lungs every 6 (six) hours as needed for wheezing or shortness of breath.     [provider]  atorvastatin (LIPITOR) 40 MG tablet Take 1 tablet (40 mg total) by mouth daily. 09/15/19 12/14/19  Geralynn Rile, MD  benazepril (LOTENSIN) 10 MG tablet Take 1 tablet (10 mg total) by mouth daily. 04/15/19   Adrian Prows, MD  budesonide-formoterol (SYMBICORT) 80-4.5 MCG/ACT inhaler Inhale 2 puffs into the lungs 2 (two) times daily. 02/09/16   Tanda Rockers, MD  buPROPion (WELLBUTRIN SR) 150 MG 12 hr tablet Take 150 mg by mouth 2 (two) times daily.     [provider]  cloNIDine (CATAPRES) 0.2 MG tablet Take 1 tablet (0.2 mg total) by mouth 2 (two) times daily. 09/17/19 12/16/19  Evans Lance, MD  doxycycline (DORYX) 100 MG EC tablet Take 100 mg  by mouth 2 (two) times daily.    [provider]  fluticasone (FLONASE) 50 MCG/ACT nasal spray Place 1 spray into both nostrils daily. 09/11/19   [provider]  furosemide (LASIX) 40 MG tablet TAKE 1 TABLET(40 MG) BY MOUTH DAILY 08/22/19   Adrian Prows, MD  gabapentin (NEURONTIN) 100 MG capsule Take 1 capsule (100 mg total) by mouth 3 (three) times daily. 09/08/19   Marcial Pacas, MD  hydrALAZINE (APRESOLINE) 50 MG tablet Take 1 tablet (50 mg total) by mouth 3 (three) times daily. 02/10/19   Adrian Prows, MD  isosorbide dinitrate (ISORDIL) 30 MG tablet TAKE 1 TABLET(30 MG) BY MOUTH FOUR TIMES DAILY 03/19/19   Adrian Prows, MD  metoprolol tartrate (LOPRESSOR) 100 MG tablet TAKE 1 TABLET BY MOUTH TWICE DAILY 08/22/19   Einar Gip,  Ulice Dash, MD  montelukast (SINGULAIR) 10 MG tablet Take 10 mg by mouth at bedtime. 01/17/16   [provider]  ondansetron (ZOFRAN ODT) 4 MG disintegrating tablet Take 1 tablet (4 mg total) by mouth every 8 (eight) hours as needed for nausea or vomiting. 09/18/19   Deno Etienne, DO  paliperidone (INVEGA) 6 MG 24 hr tablet Take 6 mg by mouth at bedtime.    [provider]  pantoprazole (PROTONIX) 40 MG tablet Take 1 tablet (40 mg total) by mouth daily. 04/23/19 09/19/19  Albrizze, Kaitlyn E, PA-C  tamsulosin (FLOMAX) 0.4 MG CAPS capsule Take 1 capsule (0.4 mg total) by mouth daily. 09/17/19   Evans Lance, MD    Allergies    Methylpyrrolidone, Niacin, Norvasc [amlodipine besylate], Oxybutynin chloride [oxybutynin chloride er], Vesicare [solifenacin succinate], Ciprofloxacin, Oxybutynin, and Solifenacin  Review of Systems   Review of Systems  Constitutional: Positive for fever (subjective). Negative for chills.  HENT: Negative for congestion and facial swelling.   Eyes: Negative for discharge and visual disturbance.  Respiratory: Negative for shortness of breath.   Cardiovascular: Negative for chest pain and palpitations.  Gastrointestinal: Positive for  diarrhea, nausea and vomiting. Negative for abdominal pain.  Musculoskeletal: Negative for arthralgias and myalgias.  Skin: Negative for color change and rash.  Neurological: Negative for tremors, syncope and headaches.  Psychiatric/Behavioral: Negative for confusion and dysphoric mood.    Physical Exam Updated Vital Signs BP (!) 175/100   Pulse 60   Temp 98.1 F (36.7 C) (Oral)   Resp 18   SpO2 97%   Physical Exam Vitals and nursing note reviewed.  Constitutional:      Appearance: He is well-developed.  HENT:     Head: Normocephalic and atraumatic.  Eyes:     Pupils: Pupils are equal, round, and reactive to light.  Neck:     Vascular: No JVD.  Cardiovascular:     Rate and Rhythm: Normal rate and regular rhythm.     Heart sounds: No murmur. No friction rub. No gallop.   Pulmonary:     Effort: No respiratory distress.     Breath sounds: No wheezing.  Abdominal:     General: There is no distension.     Tenderness: There is no abdominal tenderness. There is no guarding or rebound.  Musculoskeletal:        General: Normal range of motion.     Cervical back: Normal range of motion and neck supple.  Skin:    Coloration: Skin is not pale.     Findings: No rash.  Neurological:     Mental Status: He is alert and oriented to person, place, and time.  Psychiatric:        Behavior: Behavior normal.     ED Results / Procedures / Treatments   Labs (all labs ordered are listed, but only abnormal results are displayed) Labs Reviewed  COMPREHENSIVE METABOLIC PANEL - Abnormal; Notable for the following components:      Result Value   Glucose, Bld 122 (*)    Creatinine, Ser 2.10 (*)    GFR calc non Af Amer 33 (*)    GFR calc Af Amer 38 (*)    All other components within normal limits  URINALYSIS, ROUTINE W REFLEX MICROSCOPIC - Abnormal; Notable for the following components:   Color, Urine STRAW (*)    Protein, ur 100 (*)    All other components within normal limits    LIPASE, BLOOD  CBC  EKG None  Radiology No results found.  Procedures Procedures (including critical care time)  Medications Ordered in ED Medications  sodium chloride flush (NS) 0.9 % injection 3 mL (3 mLs Intravenous Given 09/18/19 0617)  sodium chloride 0.9 % bolus 1,000 mL (0 mLs Intravenous Stopped 09/18/19 0703)  ondansetron (ZOFRAN) injection 4 mg (4 mg Intravenous Given 09/18/19 8502)    ED Course  I have reviewed the triage vital signs and the nursing notes.  Pertinent labs & imaging results that were available during my care of the patient were reviewed by me and considered in my medical decision making (see chart for details).    MDM Rules/Calculators/A&P                      59 yo M with a chief complaint of nausea vomiting diarrhea and fever.  Going on for about 4 days.  Patient has benign abdominal exam.  Not actively vomiting.  We will give a bolus of IV fluids check lab work antiemetics reassess. Feeling better, tolerating PO.  D/c home.   3:35 PM:  I have discussed the diagnosis/risks/treatment options with the patient and believe the pt to be eligible for discharge home to follow-up with PCP. We also discussed returning to the ED immediately if new or worsening sx occur. We discussed the sx which are most concerning (e.g., sudden worsening pain, fever, inability to tolerate by mouth) that necessitate immediate return. Medications administered to the patient during their visit and any new prescriptions provided to the patient are listed below.  Medications given during this visit Medications  sodium chloride flush (NS) 0.9 % injection 3 mL (3 mLs Intravenous Given 09/18/19 0617)  sodium chloride 0.9 % bolus 1,000 mL (0 mLs Intravenous Stopped 09/18/19 0703)  ondansetron (ZOFRAN) injection 4 mg (4 mg Intravenous Given 09/18/19 0612)     The patient appears reasonably screen and/or stabilized for discharge and I doubt any other medical condition or other [facility]  requiring further screening, evaluation, or treatment in the ED at this time prior to discharge.   Final Clinical Impression(s) / ED Diagnoses Final diagnoses:  Nausea and vomiting in adult    Rx / DC Orders ED Discharge Orders         Ordered    ondansetron (ZOFRAN ODT) 4 MG disintegrating tablet  Every 8 hours PRN     09/18/19 0702           Deno Etienne, DO 09/20/19 1535

## 2019-09-18 NOTE — ED Triage Notes (Signed)
Pt reports that he has been having n/v/d and fevers since last night.

## 2019-09-18 NOTE — ED Notes (Signed)
Discharge instructions discussed with pt. Pt verbalized understanding. Pt stable and ambulatory. No signature pad available. 

## 2019-09-19 ENCOUNTER — Encounter: Payer: Self-pay | Admitting: *Deleted

## 2019-09-19 ENCOUNTER — Other Ambulatory Visit: Payer: Self-pay

## 2019-09-19 ENCOUNTER — Encounter: Payer: Self-pay | Admitting: Nurse Practitioner

## 2019-09-19 ENCOUNTER — Ambulatory Visit (INDEPENDENT_AMBULATORY_CARE_PROVIDER_SITE_OTHER): Payer: PPO | Admitting: Nurse Practitioner

## 2019-09-19 VITALS — BP 194/90 | HR 63 | Wt 254.0 lb

## 2019-09-19 DIAGNOSIS — I10 Essential (primary) hypertension: Secondary | ICD-10-CM | POA: Diagnosis not present

## 2019-09-19 DIAGNOSIS — Z0000 Encounter for general adult medical examination without abnormal findings: Secondary | ICD-10-CM

## 2019-09-19 DIAGNOSIS — N183 Chronic kidney disease, stage 3 unspecified: Secondary | ICD-10-CM | POA: Diagnosis not present

## 2019-09-19 DIAGNOSIS — I441 Atrioventricular block, second degree: Secondary | ICD-10-CM

## 2019-09-19 DIAGNOSIS — R600 Localized edema: Secondary | ICD-10-CM | POA: Diagnosis not present

## 2019-09-19 LAB — POCT URINALYSIS DIPSTICK
Bilirubin, UA: NEGATIVE
Blood, UA: NEGATIVE
Glucose, UA: NEGATIVE
Ketones, UA: NEGATIVE
Leukocytes, UA: NEGATIVE
Nitrite, UA: NEGATIVE
Protein, UA: POSITIVE — AB
Spec Grav, UA: 1.02 (ref 1.010–1.025)
Urobilinogen, UA: 0.2 E.U./dL
pH, UA: 5.5 (ref 5.0–8.0)

## 2019-09-19 NOTE — Progress Notes (Signed)
[place], [place]  [address] Phone:  [phone]   Fax:  [phone]   New Patient Office Visit  Subjective:  Patient ID: Peter Marsh, male    DOB: [birth year]  Age: 59 y.o. MRN: [id number]  CC:  Chief Complaint  Patient presents with  . New Patient (Initial Visit)    new pt, establish , in the hospital on yesterday  for nausea and vomittig diarreha     HPI Peter Marsh presents to establish care. He  has a past medical history of Asthma, Bilateral carotid bruits, Cardiac conduction disorder (2018), Chronic kidney disease, Chronic kidney disease (CKD), stage IV (severe) ([place]), Depression, Encounter for care of pacemaker (02/13/2019), GERD (gastroesophageal reflux disease), History of stomach ulcers (2001), Hypertension, LBBB (left bundle branch block), Lower extremity edema, Mild intermittent asthma without complication, Mixed hyperlipidemia, Mobitz type 2 second degree AV block (04/06/2019), Neuropathy, Pacemaker: Medtronic Azure XT DR MRI [id number] PPM -  BUNDLE OF HIS pacing  (02/26/2017), Paroxysmal atrial flutter ([place]), PONV (postoperative nausea and vomiting), Prostatitis, Schizophrenia ([place]), and Sinus node dysfunction ([place]) (02/13/2019).   He was seen in the ER on yesterday for Nausea and vomiting.   Nausea / Vomiting Patient complains of nausea and vomiting. Onset of symptoms was yesterday. Patient describes nausea as mild. Vomiting has occurred 1 times over the past 1 day. Vomitus is described as normal gastric contents. Symptoms have been associated with diarrhea occurring X1 on Wednesday. Patient denies fever. Symptoms have progressed to a point and plateaued. Evaluation to date has been seen in ER: on yesterday for the nausea. . Treatment to date has been Intravenous fluids administered 1 day ago: NS 1 liter.[name]   He admits that he is followed by several specialist. He just needs a new [place] Provider due to previous  personally conflicts.   Past Medical History:  Diagnosis Date  . Asthma    uses inhalers   . Bilateral carotid bruits   . Cardiac conduction disorder 2018   s/p MDT PPM  . Chronic kidney disease    bladder interstial cystitis   . Chronic kidney disease (CKD), stage IV (severe) ([name])    followed by Dr. Joelyn Oms at [facility]  . Depression   . Encounter for care of pacemaker 02/13/2019  . GERD (gastroesophageal reflux disease)   . History of stomach ulcers 2001  . Hypertension   . LBBB (left bundle branch block)   . Lower extremity edema   . Mild intermittent asthma without complication   . Mixed hyperlipidemia   . Mobitz type 2 second degree AV block 04/06/2019  . Neuropathy   . Pacemaker: [employer]onic Azure XT DR MRI [id number] PPM -  BUNDLE OF HIS pacing  02/26/2017   Scheduled Remote pacemaker check  11/12/2018:  There were 24 Fast AV episodes:  EGMs show SVTs. Episodes lasted < 2 minutes. Health trends do not demonstrate significant abnormality. Battery longevity is 9.4 - 10.3 years. RA pacing is 47.1 %, RV pacing is 40.4 %.  Clinic check 11/07/17.   . Paroxysmal atrial flutter ([place])   . PONV (postoperative nausea and vomiting)   . Prostatitis   . Schizophrenia ([place])   . Sinus node dysfunction ([place]) 02/13/2019    Past Surgical History:  Procedure Laterality Date  . APPENDECTOMY    . ELECTROPHYSIOLOGY STUDY N/A 02/26/2017   Procedure: ELECTROPHYSIOLOGY STUDY;  Surgeon: Evans Lance, MD;  Location: [facility];  Service: Cardiovascular;  Laterality: N/A;  . ESOPHAGOGASTRODUODENOSCOPY (EGD) WITH PROPOFOL N/A 09/18/2016   Procedure: ESOPHAGOGASTRODUODENOSCOPY (EGD) WITH PROPOFOL;  Surgeon: Ronnette Juniper, MD;  Location: [facility];  Service: Gastroenterology;  Laterality: N/A;  . LUMBAR LAMINECTOMY/DECOMPRESSION MICRODISCECTOMY  04/19/2011   Procedure: LUMBAR LAMINECTOMY/DECOMPRESSION MICRODISCECTOMY;  Surgeon: Tobi Bastos;  Location: [facility];  Service: Orthopedics;   Laterality: Left;  Hemi LAminectomy/Microdiscectomy Lumbar four  - Lumbar five  on the Left (X-Ray)  . PACEMAKER IMPLANT N/A 02/26/2017   Procedure: PACEMAKER IMPLANT;  Surgeon: Evans Lance, MD;  Location: [facility];  Service: Cardiovascular;  Laterality: N/A;  . TONSILLECTOMY      Family History  Problem Relation Age of Onset  . High blood pressure Mother   . Alzheimer's disease Father     Social History   Socioeconomic History  . Marital status: Married    Spouse name: Not on file  . Number of children: 1  . Years of education: 57  . Highest education level: High school graduate  Occupational History  . Occupation: Retired  Tobacco Use  . Smoking status: Former Smoker    Packs/day: 1.00    Years: 30.00    Pack years: 30.00    Quit date: 05/08/2008    Years since quitting: 11.3  . Smokeless tobacco: Never Used  Substance and Sexual Activity  . Alcohol use: No  . Drug use: No  . Sexual activity: Never  Other Topics Concern  . Not on file  Social History Narrative   Lives at home with wife.   Right-handed.   One cup caffeine per day.   Social Determinants of Health   Financial Resource Strain:   . Difficulty of Paying Living Expenses:   Food Insecurity:   . Worried About Charity fundraiser in the Last Year:   . Arboriculturist in the Last Year:   Transportation Needs:   . Film/video editor (Medical):   [name] Lack of Transportation (Non-Medical):   Physical Activity:   . Days of Exercise per Week:   . Minutes of Exercise per Session:   Stress:   . Feeling of Stress :   Social Connections:   . Frequency of Communication with Friends and Family:   . Frequency of Social Gatherings with Friends and Family:   . Attends Religious Services:   . Active Member of Clubs or Organizations:   . Attends Archivist Meetings:   [name] Marital Status:   Intimate Partner Violence:   . Fear of Current or Ex-Partner:   . Emotionally Abused:   [name] Physically  Abused:   . Sexually Abused:     ROS Review of Systems  Constitutional: Negative.   HENT: Negative.   Eyes: Negative.   Respiratory: Negative.   Cardiovascular: Negative.   Gastrointestinal: Negative.   Endocrine: Negative.   Genitourinary:       Was seen by Urology this week  Musculoskeletal: Negative.   Skin: Negative.   Allergic/Immunologic: Negative.   Neurological: Negative.        Headache on yesterday denies headache today.   Hematological: Negative.   All other systems reviewed and are negative.   Objective:   Today's Vitals: BP (!) 194/90 (BP Location: Left Arm) Comment (BP Location): manually  Pulse 63   Wt 254 lb (115.2 kg)   SpO2 100%   BMI 33.51 kg/m   Physical Exam Constitutional:      Appearance: He is obese.  Cardiovascular:  Pulses: Normal pulses.     Heart sounds: Normal heart sounds.     Comments: 1+ pitting edema BLE  Pulmonary:     Effort: Pulmonary effort is normal.     Breath sounds: Normal breath sounds.  Abdominal:     General: Bowel sounds are normal.     Palpations: Abdomen is soft.  Musculoskeletal:     Cervical back: Normal range of motion.  Skin:    General: Skin is warm.  Neurological:     Mental Status: He is alert and oriented to person, place, and time.  Psychiatric:        Mood and Affect: Mood normal.        Behavior: Behavior normal.        Thought Content: Thought content normal.        Judgment: Judgment normal.     Assessment & Plan:   Problem List Items Addressed This Visit      High   CKD (chronic kidney disease), stage III ([name])   Mobitz type 2 second degree AV block    Other Visit Diagnoses    Health care maintenance    -  Primary   Relevant Orders   POCT Urinalysis Dipstick (Completed)   Hypertension, unspecified type       Continue to follow cardiology   Localized edema       Relevant Orders   Compression stockings      Outpatient Encounter Medications as of 09/19/2019  Medication Sig  .  acetaminophen (TYLENOL) 325 MG tablet Take 650 mg by mouth every 6 (six) hours as needed for mild pain or headache.  . albuterol (PROVENTIL HFA;VENTOLIN HFA) 108 (90 BASE) MCG/ACT inhaler Inhale 2 puffs into the lungs every 6 (six) hours as needed for wheezing or shortness of breath.   [name] atorvastatin (LIPITOR) 40 MG tablet Take 1 tablet (40 mg total) by mouth daily.  . benazepril (LOTENSIN) 10 MG tablet Take 1 tablet (10 mg total) by mouth daily.  . budesonide-formoterol (SYMBICORT) 80-4.5 MCG/ACT inhaler Inhale 2 puffs into the lungs 2 (two) times daily.  [name] buPROPion (WELLBUTRIN SR) 150 MG 12 hr tablet Take 150 mg by mouth 2 (two) times daily.   . cloNIDine (CATAPRES) 0.2 MG tablet Take 1 tablet (0.2 mg total) by mouth 2 (two) times daily.  [name] doxycycline (DORYX) 100 MG EC tablet Take 100 mg by mouth 2 (two) times daily.  . fluticasone (FLONASE) 50 MCG/ACT nasal spray Place 1 spray into both nostrils daily.  . furosemide (LASIX) 40 MG tablet TAKE 1 TABLET(40 MG) BY MOUTH DAILY  . gabapentin (NEURONTIN) 100 MG capsule Take 1 capsule (100 mg total) by mouth 3 (three) times daily.  . hydrALAZINE (APRESOLINE) 50 MG tablet Take 1 tablet (50 mg total) by mouth 3 (three) times daily.  . isosorbide dinitrate (ISORDIL) 30 MG tablet TAKE 1 TABLET(30 MG) BY MOUTH FOUR TIMES DAILY  . metoprolol tartrate (LOPRESSOR) 100 MG tablet TAKE 1 TABLET BY MOUTH TWICE DAILY  . montelukast (SINGULAIR) 10 MG tablet Take 10 mg by mouth at bedtime.  . ondansetron (ZOFRAN ODT) 4 MG disintegrating tablet Take 1 tablet (4 mg total) by mouth every 8 (eight) hours as needed for nausea or vomiting.  . paliperidone (INVEGA) 6 MG 24 hr tablet Take 6 mg by mouth at bedtime.  . pantoprazole (PROTONIX) 40 MG tablet Take 1 tablet (40 mg total) by mouth daily.  . tamsulosin (FLOMAX) 0.4 MG CAPS capsule Take 1 capsule (0.4  mg total) by mouth daily.   Facility-Administered Encounter Medications as of 09/19/2019  Medication  .  acetaminophen (OFIRMEV) IVPB  . ePHEDrine injection  . glycopyrrolate (ROBINUL) injection  . SUFentanil (SUFENTA) injection    Follow-up: Return in about 6 months (around 03/21/2020).   Vevelyn Francois, NP

## 2019-09-19 NOTE — Patient Instructions (Signed)
   Managing Your Hypertension Hypertension is commonly called high blood pressure. This is when the force of your blood pressing against the walls of your arteries is too strong. Arteries are blood vessels that carry blood from your heart throughout your body. Hypertension forces the heart to work harder to pump blood, and may cause the arteries to become narrow or stiff. Having untreated or uncontrolled hypertension can cause heart attack, stroke, kidney disease, and other problems. What are blood pressure readings? A blood pressure reading consists of a higher number over a lower number. Ideally, your blood pressure should be below 120/80. The first ("top") number is called the systolic pressure. It is a measure of the pressure in your arteries as your heart beats. The second ("bottom") number is called the diastolic pressure. It is a measure of the pressure in your arteries as the heart relaxes. What does my blood pressure reading mean? Blood pressure is classified into four stages. Based on your blood pressure reading, your health care provider may use the following stages to determine what type of treatment you need, if any. Systolic pressure and diastolic pressure are measured in a unit called mm Hg. Normal  Systolic pressure: below 120.  Diastolic pressure: below 80. Elevated  Systolic pressure: 120-129.  Diastolic pressure: below 80. Hypertension stage 1  Systolic pressure: 130-139.  Diastolic pressure: 80-89. Hypertension stage 2  Systolic pressure: 140 or above.  Diastolic pressure: 90 or above. What health risks are associated with hypertension? Managing your hypertension is an important responsibility. Uncontrolled hypertension can lead to:  A heart attack.  A stroke.  A weakened blood vessel (aneurysm).  Heart failure.  Kidney damage.  Eye damage.  Metabolic syndrome.  Memory and concentration problems. What changes can I make to manage my  hypertension? Hypertension can be managed by making lifestyle changes and possibly by taking medicines. Your health care provider will help you make a plan to bring your blood pressure within a normal range. Eating and drinking   Eat a diet that is high in fiber and potassium, and low in salt (sodium), added sugar, and fat. An example eating plan is called the DASH (Dietary Approaches to Stop Hypertension) diet. To eat this way: ? Eat plenty of fresh fruits and vegetables. Try to fill half of your plate at each meal with fruits and vegetables. ? Eat whole grains, such as whole wheat pasta, brown rice, or whole grain bread. Fill about one quarter of your plate with whole grains. ? Eat low-fat diary products. ? Avoid fatty cuts of meat, processed or cured meats, and poultry with skin. Fill about one quarter of your plate with lean proteins such as fish, chicken without skin, beans, eggs, and tofu. ? Avoid premade and processed foods. These tend to be higher in sodium, added sugar, and fat.  Reduce your daily sodium intake. Most people with hypertension should eat less than 1,500 mg of sodium a day.  Limit alcohol intake to no more than 1 drink a day for nonpregnant women and 2 drinks a day for men. One drink equals 12 oz of beer, 5 oz of wine, or 1 oz of hard liquor. Lifestyle  Work with your health care provider to maintain a healthy body weight, or to lose weight. Ask what an ideal weight is for you.  Get at least 30 minutes of exercise that causes your heart to beat faster (aerobic exercise) most days of the week. Activities may include walking, swimming, or biking.    Include exercise to strengthen your muscles (resistance exercise), such as weight lifting, as part of your weekly exercise routine. Try to do these types of exercises for 30 minutes at least 3 days a week.  Do not use any products that contain nicotine or tobacco, such as cigarettes and e-cigarettes. If you need help quitting,  ask your health care provider.  Control any long-term (chronic) conditions you have, such as high cholesterol or diabetes. Monitoring  Monitor your blood pressure at home as told by your health care provider. Your personal target blood pressure may vary depending on your medical conditions, your age, and other factors.  Have your blood pressure checked regularly, as often as told by your health care provider. Working with your health care provider  Review all the medicines you take with your health care provider because there may be side effects or interactions.  Talk with your health care provider about your diet, exercise habits, and other lifestyle factors that may be contributing to hypertension.  Visit your health care provider regularly. Your health care provider can help you create and adjust your plan for managing hypertension. Will I need medicine to control my blood pressure? Your health care provider may prescribe medicine if lifestyle changes are not enough to get your blood pressure under control, and if:  Your systolic blood pressure is 130 or higher.  Your diastolic blood pressure is 80 or higher. Take medicines only as told by your health care provider. Follow the directions carefully. Blood pressure medicines must be taken as prescribed. The medicine does not work as well when you skip doses. Skipping doses also puts you at risk for problems. Contact a health care provider if:  You think you are having a reaction to medicines you have taken.  You have repeated (recurrent) headaches.  You feel dizzy.  You have swelling in your ankles.  You have trouble with your vision. Get help right away if:  You develop a severe headache or confusion.  You have unusual weakness or numbness, or you feel faint.  You have severe pain in your chest or abdomen.  You vomit repeatedly.  You have trouble breathing. Summary  Hypertension is when the force of blood pumping  through your arteries is too strong. If this condition is not controlled, it may put you at risk for serious complications.  Your personal target blood pressure may vary depending on your medical conditions, your age, and other factors. For most people, a normal blood pressure is less than 120/80.  Hypertension is managed by lifestyle changes, medicines, or both. Lifestyle changes include weight loss, eating a healthy, low-sodium diet, exercising more, and limiting alcohol. This information is not intended to replace advice given to you by your health care provider. Make sure you discuss any questions you have with your health care provider. Document Revised: 08/16/2018 Document Reviewed: 03/22/2016 Elsevier Patient Education  2020 [employer]  

## 2019-09-21 LAB — METHYLMALONIC ACID, SERUM: Methylmalonic Acid: 697 nmol/L — ABNORMAL HIGH (ref 0–378)

## 2019-09-21 LAB — FOLATE: Folate: 5.8 ng/mL (ref >3.0)

## 2019-09-21 LAB — VITAMIN B12: Vitamin B-12: 185 pg/mL — ABNORMAL LOW (ref 232–1245)

## 2019-09-21 LAB — HOMOCYSTEINE: Homocysteine: 21.8 umol/L — ABNORMAL HIGH (ref 0.0–14.5)

## 2019-09-22 ENCOUNTER — Telehealth: Payer: Self-pay | Admitting: Family Medicine

## 2019-09-22 ENCOUNTER — Other Ambulatory Visit: Payer: Self-pay | Admitting: Nurse Practitioner

## 2019-09-22 ENCOUNTER — Telehealth: Payer: Self-pay | Admitting: Neurology

## 2019-09-22 DIAGNOSIS — K219 Gastro-esophageal reflux disease without esophagitis: Secondary | ICD-10-CM

## 2019-09-22 NOTE — Telephone Encounter (Signed)
I spoke to the patient and provided him with his results. He declined injections. It is difficult to get to the office that often due to lack of transportation and limited income. Per vo by Dr. Krista Blue, he should start an oral B12 supplement of 1068mcg per day. He is agreeable to start the OTC tablets and will ask his PCP to continue to monitor his levels.

## 2019-09-22 NOTE — Telephone Encounter (Signed)
Please call patient, laboratory evaluation confirmed B12 deficiency, please decrease vitamin B12 level 185, elevated methylmalonic acid, homocystine level He can initiate IM B12 supplement through his primary care office or our office, B12 1000 mcg daily for 1 week, weekly for 1 month, then monthly

## 2019-09-23 LAB — CUP PACEART INCLINIC DEVICE CHECK
Battery Remaining Longevity: 94 mo
Battery Voltage: 2.97 V
Brady Statistic AP VP Percent: 75.78 %
Brady Statistic AP VS Percent: 6.18 %
Brady Statistic AS VP Percent: 0.01 %
Brady Statistic AS VS Percent: 18.03 %
Brady Statistic RA Percent Paced: 81.94 %
Brady Statistic RV Percent Paced: 75.79 %
Date Time Interrogation Session: 20210507163627
Implantable Lead Implant Date: 20181022
Implantable Lead Implant Date: 20181022
Implantable Lead Location: 753859
Implantable Lead Location: 753860
Implantable Lead Model: 3830
Implantable Lead Model: 5076
Implantable Pulse Generator Implant Date: 20181022
Lead Channel Impedance Value: 323 Ohm
Lead Channel Impedance Value: 342 Ohm
Lead Channel Impedance Value: 475 Ohm
Lead Channel Impedance Value: 551 Ohm
Lead Channel Pacing Threshold Amplitude: 0.75 V
Lead Channel Pacing Threshold Amplitude: 0.75 V
Lead Channel Pacing Threshold Pulse Width: 0.4 ms
Lead Channel Pacing Threshold Pulse Width: 0.4 ms
Lead Channel Sensing Intrinsic Amplitude: 10.75 mV
Lead Channel Sensing Intrinsic Amplitude: 4 mV
Lead Channel Setting Pacing Amplitude: 1.5 V
Lead Channel Setting Pacing Amplitude: 3.5 V
Lead Channel Setting Pacing Pulse Width: 1 ms
Lead Channel Setting Sensing Sensitivity: 1.2 mV

## 2019-09-23 NOTE — Telephone Encounter (Signed)
[facility] transfer accepted on 09/19/19.

## 2019-09-23 NOTE — Telephone Encounter (Signed)
Chart has been updated.

## 2019-09-25 ENCOUNTER — Encounter: Payer: Self-pay | Admitting: Gastroenterology

## 2019-09-29 NOTE — Telephone Encounter (Signed)
Sent to NP 

## 2019-09-30 ENCOUNTER — Telehealth: Payer: Self-pay | Admitting: Neurology

## 2019-09-30 NOTE — Telephone Encounter (Signed)
Pt and pt wife called to advise pt is having trouble walking and it has been going on since Sunday. Pt states he has kidney issues and had been having difficulty when getting up to go to the bathroom. Pt states he has has limited income and is unable to pay his copay to be seen. Pt is unsure what he should do.

## 2019-09-30 NOTE — Telephone Encounter (Signed)
I have spoken with the patient's wife. Says the patient is having problems getting up and down for his frequent trips to the bathroom. His difficulty is coming from the numbness, tingling and heaviness he is feeling in his legs. He is currently taking gabapentin 100mg , one capsule TID without much change or improvement. He is also experiencing some daytime drowsiness from the medication.   He has a pending appt for NCV/EMG on 10/22/19.  They are concerned about their limited income and the cost of his treatments. I have provided them with [facility] patient assistance phone number to see if they may qualify for any help.

## 2019-10-01 ENCOUNTER — Encounter: Payer: PPO | Admitting: Neurology

## 2019-10-01 ENCOUNTER — Telehealth: Payer: Self-pay | Admitting: Cardiovascular Disease

## 2019-10-01 ENCOUNTER — Ambulatory Visit: Payer: PPO | Admitting: Family Medicine

## 2019-10-01 NOTE — Telephone Encounter (Signed)
Pt c/o medication issue:  1. Name of Medication: Furosemide and Atorvastatin  2. How are you currently taking this medication (dosage and times per day)?  1 time a day  3. Are you having a reaction (difficulty breathing--STAT)? When he takes Furosemide and the Hydralazine- it makes him so weak, he can hardly walk- yes short of breath and no energy- Atorvastatin- could not talk, fell and could not walk, thought he was dying, could not swallow- slept all day, could not wake up, hands and feet numb  4. What is your medication issue?  Having reaction to  Furosemide and Atorvastatin

## 2019-10-01 NOTE — Telephone Encounter (Signed)
Ok to continue to hold statin. We will discuss at next visit.   [place] T. Audie Box, [place]  [address] [place], [place] [address] [phone]  11:40 AM

## 2019-10-01 NOTE — Telephone Encounter (Signed)
Pt updated and verbalized understanding.  

## 2019-10-01 NOTE — Telephone Encounter (Signed)
Spoke with pt who state he feels he had an allergic reaction to lasix and atorvastatin. He report on Sunday he slept pretty much all day and had a hard time waking up. Once awake, he experienced bad body aches, throat was swollen, couldn't walk, and felt like he was dying. Pt report he initally thought symptoms were related to lasix, so he didn't take it on Monday. Pt report symptoms continued that same day to the point he fell from struggling to walk. Pt state he has since stopped atorvastatin as of yesterday and symptoms has subsided. He report he feels so much better today but wanted to make MD aware.

## 2019-10-06 NOTE — Progress Notes (Deleted)
{Choose 1 Note Type (Video or Telephone):[phone]}   The patient was identified using 2 identifiers.  Date:  10/06/2019   ID:  Peter Marsh, DOB [birth year], MRN [id number]  {Patient [phone]::"Home"} {Provider Location:([phone]::"Home"}  PCP:  Vevelyn Francois, NP  Cardiologist:  Sanda Klein, MD *** Electrophysiologist:  Cristopher Peru, MD   Evaluation Performed:  {Choose Visit Type:[phone]::"Follow-Up Visit"}  Chief Complaint:  ***  History of Present Illness:    Peter Marsh is a 59 y.o. male with high grade conduction disease s/p ppm, HTN, CKD IV, atrial flutter (no AC due to GIB), HLD who presents for follow-up.   Problem List 1. Schizophrenia  2. HTN 3. CKD IV 4. Atrial flutter -no AC due to GI bleed   5. High grade conduction (HV ~70 msec) disease s/p dual chamber ppm (medtronic) 6. HLD -T chol 169, HDK 25, LDL 125, TG 96 7. Right ureter narrowing  8. COPD  The patient {does/does not:200015} have symptoms concerning for COVID-19 infection (fever, chills, cough, or new shortness of breath).    Past Medical History:  Diagnosis Date   Asthma    uses inhalers    Bilateral carotid bruits    Cardiac conduction disorder 2018   s/p MDT PPM   Chronic kidney disease    bladder interstial cystitis    Chronic kidney disease (CKD), stage IV (severe) ([name])    followed by Dr. Joelyn Oms at [facility]   Depression    Encounter for care of pacemaker 02/13/2019   GERD (gastroesophageal reflux disease)    History of stomach ulcers 2001   Hypertension    LBBB (left bundle branch block)    Lower extremity edema    Mild intermittent asthma without complication    Mixed hyperlipidemia    Mobitz type 2 second degree AV block 04/06/2019   Neuropathy    Pacemaker: Medtronic Azure XT DR MRI [id number] PPM -  BUNDLE OF HIS pacing  02/26/2017   Scheduled Remote pacemaker check  11/12/2018:  There were 24 Fast AV episodes:  EGMs show SVTs.  Episodes lasted < 2 minutes. Health trends do not demonstrate significant abnormality. Battery longevity is 9.4 - 10.3 years. RA pacing is 47.1 %, RV pacing is 40.4 %.  Clinic check 11/07/17.    Paroxysmal atrial flutter ([name])    PONV (postoperative nausea and vomiting)    Prostatitis    Schizophrenia ([place])    Sinus node dysfunction ([place]) 02/13/2019   Past Surgical History:  Procedure Laterality Date   APPENDECTOMY     ELECTROPHYSIOLOGY STUDY N/A 02/26/2017   Procedure: ELECTROPHYSIOLOGY STUDY;  Surgeon: Evans Lance, MD;  Location: [facility];  Service: Cardiovascular;  Laterality: N/A;   ESOPHAGOGASTRODUODENOSCOPY (EGD) WITH PROPOFOL N/A 09/18/2016   Procedure: ESOPHAGOGASTRODUODENOSCOPY (EGD) WITH PROPOFOL;  Surgeon: Ronnette Juniper, MD;  Location: [facility];  Service: Gastroenterology;  Laterality: N/A;   LUMBAR LAMINECTOMY/DECOMPRESSION MICRODISCECTOMY  04/19/2011   Procedure: LUMBAR LAMINECTOMY/DECOMPRESSION MICRODISCECTOMY;  Surgeon: Tobi Bastos;  Location: [facility];  Service: Orthopedics;  Laterality: Left;  Hemi LAminectomy/Microdiscectomy Lumbar four  - Lumbar five  on the Left (X-Ray)   PACEMAKER IMPLANT N/A 02/26/2017   Procedure: PACEMAKER IMPLANT;  Surgeon: Evans Lance, MD;  Location: [facility];  Service: Cardiovascular;  Laterality: N/A;   TONSILLECTOMY       No outpatient medications have been marked as taking for the 10/10/19 encounter (Appointment) with O'Neal, Cassie Freer, MD.     Allergies:   Methylpyrrolidone,  Niacin, Norvasc [amlodipine besylate], Oxybutynin chloride [oxybutynin chloride er], Vesicare [solifenacin succinate], Ciprofloxacin, Oxybutynin, and Solifenacin   Social History   Tobacco Use   Smoking status: Former Smoker    Packs/day: 1.00    Years: 30.00    Pack years: 30.00    Quit date: 05/08/2008    Years since quitting: 11.4   Smokeless tobacco: Never Used  Substance Use Topics   Alcohol use: No   Drug use:  No     Family Hx: The patient's family history includes Alzheimer's disease in his father; High blood pressure in his mother.  ROS:   Please see the history of present illness.    *** All other systems reviewed and are negative.   Prior CV studies:   The following studies were reviewed today:  ***  Labs/Other Tests and Data Reviewed:    EKG:  {EKG/Telemetry Strips Reviewed:([phone]}  Recent Labs: 09/08/2019: TSH 1.140 09/18/2019: ALT 15; BUN 18; Creatinine, Ser 2.10; Hemoglobin 13.2; Platelets 239; Potassium 4.8; Sodium 137   Recent Lipid Panel Lab Results  Component Value Date/Time   CHOL 169 11/20/2018 04:32 AM   TRIG 96 11/20/2018 04:32 AM   HDL 25 (L) 11/20/2018 04:32 AM   CHOLHDL 6.8 11/20/2018 04:32 AM   LDLCALC 125 (H) 11/20/2018 04:32 AM    Wt Readings from Last 3 Encounters:  09/19/19 254 lb (115.2 kg)  09/12/19 254 lb (115.2 kg)  09/08/19 257 lb (116.6 kg)     Objective:    Vital Signs:  There were no vitals taken for this visit.   {[facility] Virtual Exam (Optional):[phone]::"VITAL SIGNS:  reviewed"}  ASSESSMENT & PLAN:    1. ***  COVID-19 Education: The signs and symptoms of COVID-19 were discussed with the patient and how to seek care for testing (follow up with PCP or arrange E-visit).  ***The importance of social distancing was discussed today.  Time:   Today, I have spent *** minutes with the patient with telehealth technology discussing the above problems.     Medication Adjustments/Labs and Tests Ordered: Current medicines are reviewed at length with the patient today.  Concerns regarding medicines are outlined above.   Tests Ordered: No orders of the defined types were placed in this encounter.   Medication Changes: No orders of the defined types were placed in this encounter.   Follow Up:  {F/U [phone]} {follow up:15908}  Signed, Evalina Field, MD  10/06/2019 11:11 AM    [place]

## 2019-10-08 ENCOUNTER — Encounter: Payer: PPO | Admitting: Neurology

## 2019-10-09 ENCOUNTER — Other Ambulatory Visit: Payer: Self-pay

## 2019-10-09 ENCOUNTER — Encounter (HOSPITAL_COMMUNITY): Payer: Self-pay | Admitting: Emergency Medicine

## 2019-10-09 ENCOUNTER — Emergency Department (HOSPITAL_COMMUNITY)
Admission: EM | Admit: 2019-10-09 | Discharge: 2019-10-10 | Discharge status: Against Medical Advice | Payer: PPO | Attending: Emergency Medicine | Admitting: Emergency Medicine

## 2019-10-09 DIAGNOSIS — Z5321 Procedure and treatment not carried out due to patient leaving prior to being seen by health care provider: Secondary | ICD-10-CM | POA: Diagnosis not present

## 2019-10-09 DIAGNOSIS — M542 Cervicalgia: Secondary | ICD-10-CM | POA: Diagnosis not present

## 2019-10-09 MED ORDER — OXYCODONE-ACETAMINOPHEN 5-325 MG PO TABS
1.0000 | ORAL_TABLET | Freq: Once | ORAL | Status: AC
  Administered 2019-10-09: 1 via ORAL
  Filled 2019-10-09: qty 1

## 2019-10-09 NOTE — ED Triage Notes (Signed)
Patient reports posterior neck pain for several days worse with movement /changing positions , no fever or chills .

## 2019-10-10 ENCOUNTER — Telehealth: Payer: PPO | Admitting: Cardiovascular Disease

## 2019-10-10 LAB — CBC WITH DIFFERENTIAL/PLATELET
Abs Immature Granulocytes: 0.04 10*3/uL (ref 0.00–0.07)
Basophils Absolute: 0 10*3/uL (ref 0.0–0.1)
Basophils Relative: 1 %
Eosinophils Absolute: 0.4 10*3/uL (ref 0.0–0.5)
Eosinophils Relative: 7 %
HCT: 37.6 % — ABNORMAL LOW (ref 39.0–52.0)
Hemoglobin: 12.2 g/dL — ABNORMAL LOW (ref 13.0–17.0)
Immature Granulocytes: 1 %
Lymphocytes Relative: 32 %
Lymphs Abs: 2 10*3/uL (ref 0.7–4.0)
MCH: 29.4 pg (ref 26.0–34.0)
MCHC: 32.4 g/dL (ref 30.0–36.0)
MCV: 90.6 fL (ref 80.0–100.0)
Monocytes Absolute: 0.6 10*3/uL (ref 0.1–1.0)
Monocytes Relative: 10 %
Neutro Abs: 3.1 10*3/uL (ref 1.7–7.7)
Neutrophils Relative %: 49 %
Platelets: 237 10*3/uL (ref 150–400)
RBC: 4.15 MIL/uL — ABNORMAL LOW (ref 4.22–5.81)
RDW: 12.9 % (ref 11.5–15.5)
WBC: 6.2 10*3/uL (ref 4.0–10.5)
nRBC: 0 % (ref 0.0–0.2)

## 2019-10-10 LAB — BASIC METABOLIC PANEL
Anion gap: 9 (ref 5–15)
BUN: 16 mg/dL (ref 6–20)
CO2: 23 mmol/L (ref 22–32)
Calcium: 8.6 mg/dL — ABNORMAL LOW (ref 8.9–10.3)
Chloride: 106 mmol/L (ref 98–111)
Creatinine, Ser: 2.3 mg/dL — ABNORMAL HIGH (ref 0.61–1.24)
GFR calc Af Amer: 34 mL/min — ABNORMAL LOW (ref >60)
GFR calc non Af Amer: 30 mL/min — ABNORMAL LOW (ref >60)
Glucose, Bld: 103 mg/dL — ABNORMAL HIGH (ref 70–99)
Potassium: 4 mmol/L (ref 3.5–5.1)
Sodium: 138 mmol/L (ref 135–145)

## 2019-10-10 NOTE — ED Notes (Signed)
Pt labels found on my desk and pt not visualized in [facility].  Unsure if pt is gone a this point.

## 2019-10-10 NOTE — ED Notes (Signed)
Registration confirmed that pt left [facility].

## 2019-10-13 ENCOUNTER — Other Ambulatory Visit: Payer: Self-pay

## 2019-10-13 ENCOUNTER — Ambulatory Visit (INDEPENDENT_AMBULATORY_CARE_PROVIDER_SITE_OTHER): Payer: PPO | Admitting: Nurse Practitioner

## 2019-10-13 ENCOUNTER — Encounter: Payer: Self-pay | Admitting: Nurse Practitioner

## 2019-10-13 VITALS — BP 131/77 | HR 80 | Ht 72.0 in | Wt 253.8 lb

## 2019-10-13 DIAGNOSIS — N183 Chronic kidney disease, stage 3 unspecified: Secondary | ICD-10-CM | POA: Diagnosis not present

## 2019-10-13 DIAGNOSIS — R6889 Other general symptoms and signs: Secondary | ICD-10-CM | POA: Diagnosis not present

## 2019-10-13 DIAGNOSIS — M436 Torticollis: Secondary | ICD-10-CM | POA: Diagnosis not present

## 2019-10-13 NOTE — Progress Notes (Signed)
[place],   [address] Phone:  [phone]   Fax:  [phone]   Established Patient Office Visit  Subjective:  Patient ID: Peter Marsh, male    DOB: 1960-09-05  Age: 59 y.o. MRN: [id number]  CC: No chief complaint on file.   HPI Peter Marsh presents for follow up. He  has a past medical history of Asthma, Bilateral carotid bruits, Cardiac conduction disorder (2018), Chronic kidney disease, Chronic kidney disease (CKD), stage IV (severe) ([place]), Depression, Encounter for care of pacemaker (02/13/2019), GERD (gastroesophageal reflux disease), History of stomach ulcers (2001), Hypertension, LBBB (left bundle branch block), Lower extremity edema, Mild intermittent asthma without complication, Mixed hyperlipidemia, Mobitz type 2 second degree AV block (04/06/2019), Neuropathy, Pacemaker: Medtronic Azure XT DR MRI [id number] PPM -  BUNDLE OF HIS pacing  (02/26/2017), Paroxysmal atrial flutter ([place]), PONV (postoperative nausea and vomiting), Prostatitis, Schizophrenia ([place]), and Sinus node dysfunction ([place]) (02/13/2019).   Neck Pain Patient complains of neck pain. Event that precipitated these symptoms: none known. Onset of symptoms 2 weeks ago, and have been unchanged since that time. Current symptoms are stiffness in neck at the base of head. He is also having low grade temp and joint pain.  Patient denies weakness in neck. . Patient has had no prior neck problems . Previous treatments include: none . He admits that visited his aunt who owns cats. He is fearful that he may have gotten bitten a tick.He heard about the symptoms of Lyme disease on TV and admits that he has those symptoms. He wants to be checked for lyme disease.  He deneis any current rashes or any recent known tick bites.      Past Medical History:  Diagnosis Date   Asthma    uses inhalers    Bilateral carotid bruits    Cardiac conduction disorder 2018   s/p MDT PPM    Chronic kidney disease    bladder interstial cystitis    Chronic kidney disease (CKD), stage IV (severe) ([name])    followed by Dr. Joelyn Marsh at [facility]   Depression    Encounter for care of pacemaker 02/13/2019   GERD (gastroesophageal reflux disease)    History of stomach ulcers 2001   Hypertension    LBBB (left bundle branch block)    Lower extremity edema    Mild intermittent asthma without complication    Mixed hyperlipidemia    Mobitz type 2 second degree AV block 04/06/2019   Neuropathy    Pacemaker: Medtronic Azure XT DR MRI [id number] PPM -  BUNDLE OF HIS pacing  02/26/2017   Scheduled Remote pacemaker check  11/12/2018:  There were 24 Fast AV episodes:  EGMs show SVTs. Episodes lasted < 2 minutes. Health trends do not demonstrate significant abnormality. Battery longevity is 9.4 - 10.3 years. RA pacing is 47.1 %, RV pacing is 40.4 %.  Clinic check 11/07/17.    Paroxysmal atrial flutter ([name])    PONV (postoperative nausea and vomiting)    Prostatitis    Schizophrenia ([place])    Sinus node dysfunction ([place]) 02/13/2019    Past Surgical History:  Procedure Laterality Date   APPENDECTOMY     ELECTROPHYSIOLOGY STUDY N/A 02/26/2017   Procedure: ELECTROPHYSIOLOGY STUDY;  Surgeon: Peter Lance, MD;  Location: [facility];  Service: Cardiovascular;  Laterality: N/A;   ESOPHAGOGASTRODUODENOSCOPY (EGD) WITH PROPOFOL N/A 09/18/2016   Procedure: ESOPHAGOGASTRODUODENOSCOPY (EGD) WITH PROPOFOL;  Surgeon: Peter Juniper,  MD;  Location: [facility];  Service: Gastroenterology;  Laterality: N/A;   LUMBAR LAMINECTOMY/DECOMPRESSION MICRODISCECTOMY  04/19/2011   Procedure: LUMBAR LAMINECTOMY/DECOMPRESSION MICRODISCECTOMY;  Surgeon: Peter Marsh;  Location: [facility];  Service: Orthopedics;  Laterality: Left;  Hemi LAminectomy/Microdiscectomy Lumbar four  - Lumbar five  on the Left (X-Ray)   PACEMAKER IMPLANT N/A 02/26/2017   Procedure: PACEMAKER IMPLANT;  Surgeon:  Peter Lance, MD;  Location: [facility];  Service: Cardiovascular;  Laterality: N/A;   TONSILLECTOMY      Family History  Problem Relation Age of Onset   High blood pressure Mother    Alzheimer's disease Father     Social History   Socioeconomic History   Marital status: Married    Spouse name: Not on file   Number of children: 1   Years of education: 12   Highest education level: High school graduate  Occupational History   Occupation: Retired  Tobacco Use   Smoking status: Former Smoker    Packs/day: 1.00    Years: 30.00    Pack years: 30.00    Quit date: 05/08/2008    Years since quitting: 11.4   Smokeless tobacco: Never Used  Substance and Sexual Activity   Alcohol use: No   Drug use: No   Sexual activity: Never  Other Topics Concern   Not on file  Social History Narrative   Lives at home with wife.   Right-handed.   One cup caffeine per day.   Social Determinants of Health   Financial Resource Strain:    Difficulty of Paying Living Expenses:   Food Insecurity:    Worried About Charity fundraiser in the Last Year:    Arboriculturist in the Last Year:   Transportation Needs:    Film/video editor (Medical):    Lack of Transportation (Non-Medical):   Physical Activity:    Days of Exercise per Week:    Minutes of Exercise per Session:   Stress:    Feeling of Stress :   Social Connections:    Frequency of Communication with Friends and Family:    Frequency of Social Gatherings with Friends and Family:    Attends Religious Services:    Active Member of Clubs or Organizations:    Attends Music therapist:    Marital Status:   Intimate Partner Violence:    Fear of Current or Ex-Partner:    Emotionally Abused:    Physically Abused:    Sexually Abused:     Outpatient Medications Prior to Visit  Medication Sig Dispense Refill   acetaminophen (TYLENOL) 325 MG tablet Take 650 mg by mouth every 6  (six) hours as needed for mild pain or headache.     albuterol (PROVENTIL HFA;VENTOLIN HFA) 108 (90 BASE) MCG/ACT inhaler Inhale 2 puffs into the lungs every 6 (six) hours as needed for wheezing or shortness of breath.      benazepril (LOTENSIN) 10 MG tablet Take 1 tablet (10 mg total) by mouth daily. 90 tablet 1   budesonide-formoterol (SYMBICORT) 80-4.5 MCG/ACT inhaler Inhale 2 puffs into the lungs 2 (two) times daily. 1 Inhaler 11   buPROPion (WELLBUTRIN SR) 150 MG 12 hr tablet Take 150 mg by mouth 2 (two) times daily.      cloNIDine (CATAPRES) 0.2 MG tablet Take 1 tablet (0.2 mg total) by mouth 2 (two) times daily. 60 tablet 2   fluticasone (FLONASE) 50 MCG/ACT nasal spray Place 1 spray into both nostrils  daily.     gabapentin (NEURONTIN) 100 MG capsule Take 1 capsule (100 mg total) by mouth 3 (three) times daily. 90 capsule 11   hydrALAZINE (APRESOLINE) 50 MG tablet Take 1 tablet (50 mg total) by mouth 3 (three) times daily. 270 tablet 2   metoprolol tartrate (LOPRESSOR) 100 MG tablet TAKE 1 TABLET BY MOUTH TWICE DAILY 180 tablet 0   montelukast (SINGULAIR) 10 MG tablet Take 10 mg by mouth at bedtime.     ondansetron (ZOFRAN ODT) 4 MG disintegrating tablet Take 1 tablet (4 mg total) by mouth every 8 (eight) hours as needed for nausea or vomiting. 20 tablet 0   paliperidone (INVEGA) 6 MG 24 hr tablet Take 6 mg by mouth at bedtime.     pantoprazole (PROTONIX) 40 MG tablet Take 1 tablet (40 mg total) by mouth daily. 30 tablet 0   tamsulosin (FLOMAX) 0.4 MG CAPS capsule Take 1 capsule (0.4 mg total) by mouth daily. 30 capsule 6   doxycycline (DORYX) 100 MG EC tablet Take 100 mg by mouth 2 (two) times daily.     isosorbide dinitrate (ISORDIL) 30 MG tablet TAKE 1 TABLET(30 MG) BY MOUTH FOUR TIMES DAILY 368 tablet 3   atorvastatin (LIPITOR) 40 MG tablet Take 1 tablet (40 mg total) by mouth daily. (Patient not taking: Reported on 10/13/2019) 30 tablet 3   furosemide (LASIX) 40 MG  tablet TAKE 1 TABLET(40 MG) BY MOUTH DAILY (Patient not taking: Reported on 10/13/2019) 30 tablet 2   Facility-Administered Medications Prior to Visit  Medication Dose Route Frequency Provider Last Rate Last Admin   acetaminophen (OFIRMEV) IVPB    PRN Lissa Morales, CRNA   1,000 mg at 04/19/11 0910   ePHEDrine injection    PRN Lissa Morales, CRNA   5 mg at 04/19/11 1018   glycopyrrolate (ROBINUL) injection    PRN Lissa Morales, CRNA   0.8 mg at 04/19/11 1037   SUFentanil (SUFENTA) injection    PRN Lissa Morales, CRNA   10 mcg at 04/19/11 1008    Allergies  Allergen Reactions   Methylpyrrolidone Hives    froze the intestine   Niacin Other (See Comments) and Nausea And Vomiting    Flushing, itching, tingling    Norvasc [Amlodipine Besylate] Other (See Comments)    Swollen Feet   Oxybutynin Chloride [Oxybutynin Chloride Er] Other (See Comments)    froze the intestine   Vesicare [Solifenacin Succinate] Other (See Comments)    Froze the intestine    Ciprofloxacin Rash and Other (See Comments)    Felt flushed    Oxybutynin Rash   Solifenacin Rash    ROS Review of Systems  All other systems reviewed and are negative.     Objective:    Physical Exam  Constitutional: He is oriented to person, place, and time. He appears well-developed. No distress.  HENT:  Head: Normocephalic.  Eyes: Pupils are equal, round, and reactive to light.  Cardiovascular: Normal rate, regular rhythm, normal heart sounds and intact distal pulses.  Pulmonary/Chest: Effort normal and breath sounds normal.  Musculoskeletal:        General: Tenderness present. No deformity or edema.     Cervical back: Normal range of motion.  Neurological: He is alert and oriented to person, place, and time.  Skin: Skin is warm and dry. He is not diaphoretic.  Psychiatric: He has a normal mood and affect.    BP 131/77    Pulse 80    Ht  6' (1.829 m)    Wt 253 lb 12.8 oz (115.1 kg)    SpO2 98%    BMI 34.42  kg/m  Wt Readings from Last 3 Encounters:  10/13/19 253 lb 12.8 oz (115.1 kg)  10/09/19 264 lb 8.8 oz (120 kg)  09/19/19 254 lb (115.2 kg)     Health Maintenance Due  Topic Date Due   Hepatitis C Screening  Never done   COLONOSCOPY  Never done    There are no preventive care reminders to display for this patient.  Lab Results  Component Value Date   TSH 1.140 09/08/2019   Lab Results  Component Value Date   WBC 6.2 10/09/2019   HGB 12.2 (L) 10/09/2019   HCT 37.6 (L) 10/09/2019   MCV 90.6 10/09/2019   PLT 237 10/09/2019   Lab Results  Component Value Date   NA 138 10/09/2019   K 4.0 10/09/2019   CO2 23 10/09/2019   GLUCOSE 103 (H) 10/09/2019   BUN 16 10/09/2019   CREATININE 2.30 (H) 10/09/2019   BILITOT 1.0 09/18/2019   ALKPHOS 59 09/18/2019   AST 22 09/18/2019   ALT 15 09/18/2019   PROT 6.8 09/18/2019   ALBUMIN 3.9 09/18/2019   CALCIUM 8.6 (L) 10/09/2019   ANIONGAP 9 10/09/2019   Lab Results  Component Value Date   CHOL 169 11/20/2018   Lab Results  Component Value Date   HDL 25 (L) 11/20/2018   Lab Results  Component Value Date   LDLCALC 125 (H) 11/20/2018   Lab Results  Component Value Date   TRIG 96 11/20/2018   Lab Results  Component Value Date   CHOLHDL 6.8 11/20/2018   Lab Results  Component Value Date   HGBA1C 5.5 09/08/2019      Assessment & Plan:   Problem List Items Addressed This Visit      Genitourinary   CKD (chronic kidney disease), stage III ([place]) - Primary    Other Visit Diagnoses    Neck stiffness       Sed rate pending  Will continue to monitor if symptoms persist will follow up with an xray.   Relevant Orders   Sedimentation rate   Hepatic Function Panel   Lyme Ab/Western Blot Reflex   Suspected Lyme disease       evaluation pending   Relevant Orders   Lyme Ab/Western Blot Reflex      No orders of the defined types were placed in this encounter.   Follow-up: Return for Appointment As Scheduled.     Vevelyn Francois, NP

## 2019-10-13 NOTE — Patient Instructions (Signed)
Lyme Disease Lyme disease is an infection that can affect many parts of the body, including the skin, joints, and nervous system. It is a bacterial infection that starts from the bite of an infected tick. Over time, the infection can worsen, and some of the symptoms are similar to the flu. If Lyme disease is not treated, it may cause joint pain, swelling, numbness, problems thinking, fatigue, muscle weakness, and other problems. What are the causes? This condition is caused by bacteria called Borrelia burgdorferi.  You can get Lyme disease by being bitten by an infected tick.  Only black-legged, or Ixodes, ticks that are infected with the bacteria can cause Lyme disease.  The tick must be attached to your skin for a certain period of time to pass along the infection. This is usually 36-48 hours.  Deer often carry infected ticks. What increases the risk? The following factors may make you more likely to develop this condition:  Living in or visiting these areas in the [place] ? [place]. ? [place]. ? [place].  Spending time in wooded or grassy areas.  Being outdoors with exposed skin.  Camping, gardening, hiking, fishing, hunting, or working outdoors.  Failing to remove a tick from your skin. What are the signs or symptoms? Symptoms of this condition may include:  Chills and fever.  Headache.  Fatigue.  General achiness.  Muscle pain.  Joint pain, often in the knees.  A round, red rash that surrounds the center of the tick bite. The center of the rash may be blood colored or have tiny blisters.  Swollen lymph glands.  Stiff neck. How is this diagnosed? This condition is diagnosed based on:  Your symptoms and medical history.  A physical exam.  A blood test. How is this treated? The main treatment for this condition is antibiotic medicine, which is usually taken by mouth (orally).  The length of treatment depends on how soon after a  tick bite you begin taking the medicine. In some cases, treatment is necessary for several weeks.  If the infection is severe, antibiotics may need to be given through an IV that is inserted into one of your veins. Follow these instructions at home:  Take over-the-counter and prescription medicines only as told by your health care provider. Finish all antibiotic medicine, even when you start to feel better.  Ask your health care provider about taking a probiotic in between doses of your antibiotic to help avoid an upset stomach or diarrhea.  Check with your health care provider before supplementing your treatment. Many alternative therapies have not been proven and may be harmful to you.  Keep all follow-up visits as told by your health care provider. This is important. How is this prevented? You can become reinfected if you get another tick bite from an infected tick. Take these steps to help prevent an infection:  Cover your skin with light-colored clothing when you are outdoors in the spring and summer months.  Spray clothing and skin with bug spray. The spray should be 20-30% DEET. You can also treat clothing with permethrin, and let it dry before you wear it. Do not apply permethrin directly to your skin. Permethrin can also be used to treat camping gear and boots. Always read and follow the instructions that come with a bug spray or insecticide.  Avoid wooded, grassy, and shaded areas.  Remove yard litter, brush, trash, and plants that attract deer and rodents.  Check yourself for ticks when  you come indoors.  Wash clothing worn each day.  Shower after spending time outdoors.  Check your pets for ticks before they come inside.  If you find a tick attached to your skin: ? Remove it with tweezers. ? Clean your hands and the bite area with rubbing alcohol or soap and water. ? Dispose of the tick by putting it in rubbing alcohol, putting it in a sealed bag or container, or  flushing it down the toilet. ? You may choose to save the tick in a sealed container if you wish for it to be tested at a later time. Pregnant women should take special care to avoid tick bites because it is possible that the infection may be passed along to the fetus. Contact a health care provider if:  You have symptoms after treatment.  You have removed a tick and want to bring it to your health care provider for testing. Get help right away if:  You have an irregular heartbeat.  You have chest pain.  You have nerve pain.  Your face feels numb.  You develop the following: ? A stiff neck. ? A severe headache. ? Severe nausea and vomiting. ? Sensitivity to light. Summary  Lyme disease is an infection that can affect many parts of the body, including the skin, joints, and nervous system.  This condition is caused by bacteria called Borrelia burgdorferi.  You can get Lyme disease by being bitten by an infected tick.  The main treatment for this condition is antibiotic medicine. This information is not intended to replace advice given to you by your health care provider. Make sure you discuss any questions you have with your health care provider. Document Revised: 08/16/2018 Document Reviewed: 07/11/2018 Elsevier Patient Education  [place].

## 2019-10-21 LAB — LYME, WESTERN BLOT, SERUM (REFLEXED)
IgG P18 Ab.: ABSENT
IgG P23 Ab.: ABSENT
IgG P28 Ab.: ABSENT
IgG P30 Ab.: ABSENT
IgG P39 Ab.: ABSENT
IgG P41 Ab.: ABSENT
IgG P45 Ab.: ABSENT
IgG P58 Ab.: ABSENT
IgG P66 Ab.: ABSENT
IgG P93 Ab.: ABSENT
IgM P23 Ab.: ABSENT
IgM P39 Ab.: ABSENT
IgM P41 Ab.: ABSENT
Lyme IgG Wb: NEGATIVE
Lyme IgM Wb: NEGATIVE

## 2019-10-21 LAB — HEPATIC FUNCTION PANEL
ALT: 11 IU/L (ref 0–44)
AST: 13 IU/L (ref 0–40)
Albumin: 4 g/dL (ref 3.8–4.9)
Alkaline Phosphatase: 87 IU/L (ref 48–121)
Bilirubin Total: 0.2 mg/dL (ref 0.0–1.2)
Bilirubin, Direct: 0.07 mg/dL (ref 0.00–0.40)
Total Protein: 6.3 g/dL (ref 6.0–8.5)

## 2019-10-21 LAB — LYME AB/WESTERN BLOT REFLEX
LYME DISEASE AB, QUANT, IGM: 1 index — ABNORMAL HIGH (ref 0.00–0.79)
Lyme IgG/IgM Ab: <0.91 {ISR} (ref 0.00–0.90)

## 2019-10-21 LAB — SEDIMENTATION RATE: Sed Rate: 5 mm/hr (ref 0–30)

## 2019-10-22 ENCOUNTER — Ambulatory Visit (INDEPENDENT_AMBULATORY_CARE_PROVIDER_SITE_OTHER): Payer: PPO | Admitting: Neurology

## 2019-10-22 ENCOUNTER — Ambulatory Visit: Payer: PPO | Admitting: Neurology

## 2019-10-22 DIAGNOSIS — R202 Paresthesia of skin: Secondary | ICD-10-CM | POA: Diagnosis not present

## 2019-10-22 DIAGNOSIS — G609 Hereditary and idiopathic neuropathy, unspecified: Secondary | ICD-10-CM

## 2019-10-22 DIAGNOSIS — R269 Unspecified abnormalities of gait and mobility: Secondary | ICD-10-CM | POA: Insufficient documentation

## 2019-10-22 DIAGNOSIS — E538 Deficiency of other specified B group vitamins: Secondary | ICD-10-CM

## 2019-10-22 NOTE — Procedures (Signed)
Full Name: Peter Marsh Gender: Male MRN #: [id number] Date of Birth: July 11, 1960    Visit Date: 10/22/2019 08:41 Age: 59 Years Examining Physician: Marcial Pacas, MD  Referring Physician: Marcial Pacas, MD Height: 6 feet 0 inch History: 59 year old male, complains of chronic low back pain, intermittent bilateral lower extremity paresthesia Summary of the tests: Nerve conduction study: Bilateral superficial peroneal, right sural sensory responses were absent.  Left sural sensory response showed significantly decreased snap amplitude.  Right ulnar sensory and motor responses were normal.  Bilateral peroneal to EDB, and tibial motor responses showed normal distal latency, CMAP amplitude, with mildly slow conduction velocity.  Electromyography: Selected needle examination of bilateral lower extremity muscles, and bilateral lumbosacral paraspinal muscles showed no significant abnormality.  Conclusion: This is an abnormal study.  There is electrodiagnostic evidence of length dependent mild axonal sensorimotor polyneuropathy.  There is no evidence of bilateral lumbosacral radiculopathy.    ------------------------------- Marcial Pacas, M.D. PhD  [facility] [address], [place], [place] [address] Tel: [phone] Fax: [phone]  Verbal informed consent was obtained from the patient, patient was informed of potential risk of procedure, including bruising, bleeding, hematoma formation, infection, muscle weakness, muscle pain, numbness, among others.        [name]    Nerve / Sites Muscle Latency Ref. Amplitude Ref. Rel Amp Segments Distance Velocity Ref. Area    ms ms mV mV %  cm m/s m/s mVms  R Ulnar - ADM     Wrist ADM 2.9 ?3.3 10.5 ?6.0 100 Wrist - ADM 7   30.7     B.Elbow ADM 7.3  9.9  93.6 B.Elbow - Wrist 23 53 ?49 29.0     A.Elbow ADM 9.2  10.2  103 A.Elbow - B.Elbow 10 51 ?49 29.9  R Peroneal - EDB     Ankle EDB 4.1 ?6.5 4.2 ?2.0 100 Ankle - EDB  9   14.2     Fib head EDB 12.9  3.7  88.7 Fib head - Ankle 34 39 ?44 16.2     Pop fossa EDB 15.5  3.5  94.7 Pop fossa - Fib head 10 39 ?44 15.8         Pop fossa - Ankle      L Peroneal - EDB     Ankle EDB 3.6 ?6.5 3.8 ?2.0 100 Ankle - EDB 9   10.3     Fib head EDB 11.9  2.9  76.6 Fib head - Ankle 32 38 ?44 10.7     Pop fossa EDB 14.5  3.1  105 Pop fossa - Fib head 10 39 ?44 9.6         Pop fossa - Ankle      R Tibial - AH     Ankle AH 3.0 ?5.8 8.7 ?4.0 100 Ankle - AH 9   20.7     Pop fossa AH 14.9  5.4  61.7 Pop fossa - Ankle 43 36 ?41 19.0  L Tibial - AH     Ankle AH 2.8 ?5.8 7.6 ?4.0 100 Ankle - AH 9   20.1     Pop fossa AH 14.3  5.6  73.6 Pop fossa - Ankle 44 38 ?41 19.4               SNC    Nerve / Sites Rec. Site Peak Lat Ref.  Amp Ref. Segments Distance    ms ms V V  cm  R Sural - Ankle (Calf)     Calf Ankle NR ?4.4 NR ?6 Calf - Ankle 14  L Sural - Ankle (Calf)     Calf Ankle 4.7 ?4.4 2 ?6 Calf - Ankle 14  R Superficial peroneal - Ankle     Lat leg Ankle NR ?4.4 NR ?6 Lat leg - Ankle 14  L Superficial peroneal - Ankle     Lat leg Ankle NR ?4.4 NR ?6 Lat leg - Ankle 14  R Ulnar - Orthodromic, (Dig V, Mid palm)     Dig V Wrist 2.6 ?3.1 8 ?5 Dig V - Wrist 22               F  Wave    Nerve F Lat Ref.   ms ms  R Tibial - AH 63.5 ?56.0  L Tibial - AH 59.8 ?56.0  R Ulnar - ADM 30.7 ?32.0           EMG Summary Table    Spontaneous MUAP Recruitment  Muscle IA Fib PSW Fasc Other Amp Dur. Poly Pattern  R. Tibialis anterior Normal None None None _______ Normal Normal Normal Normal  R. Tibialis posterior Normal None None None _______ Normal Normal Normal Normal  R. Peroneus longus Normal None None None _______ Normal Normal Normal Normal  R. Gastrocnemius (Medial head) Normal None None None _______ Normal Normal Normal Normal  R. Vastus lateralis Normal None None None _______ Normal Normal Normal Normal  L. Tibialis anterior Normal None None None _______ Normal Normal  Normal Normal  L. Tibialis posterior Normal None None None _______ Normal Normal Normal Normal  L. Peroneus longus Normal None None None _______ Normal Normal Normal Normal  L. Gastrocnemius (Medial head) Normal None None None _______ Normal Normal Normal Normal  L. Vastus lateralis Normal None None None _______ Normal Normal Normal Normal  R. Lumbar paraspinals (mid) Normal None None None _______ Normal Normal Normal Normal  R. Lumbar paraspinals (low) Normal None None None _______ Normal Normal Normal Normal  L. Lumbar paraspinals (low) Normal None None None _______ Normal Normal Normal Normal  L. Lumbar paraspinals (mid) Normal None None None _______ Normal Normal Normal Normal

## 2019-10-22 NOTE — Progress Notes (Signed)
PATIENT: Peter Marsh DOB: 1961-05-05  No chief complaint on file.    HISTORICAL  Peter Marsh is a 59 year old male, seen in request by his cardiologist Dr. Audie Box, Cassie Freer and primary care PA Marda Stalker for evaluation of bilateral feet paresthesia, initial evaluation was on Sep 08, 2019.  I have reviewed and summarized the referring note from the referring physician.  He has past medical history of hypertension, hyperlipidemia, status post pacemaker in 2018 by Dr. Lovena Le for high-grade AV block, paroxysmal atrial flutter, not on anticoagulation due to history of GI bleeding, he is also followed up by urologist for recurrent prostatitis, right ureteral narrowing, planning on procedure later this summer, he also had a long history of prediabetes, not on any medication for diabetes.  He reported long history of chronic low back pain, had a history of lumbar decompression surgery in the past, still complains of 8 out of 10 low back pain, radiating pain to bilateral calf, especially after prolonged walking, complains of bilateral calf muscle spasm,  Since 2020, he began to experience bilateral feet paresthesia, mainly involving plantar surface and toes, numbness, burning sensation, worsening after bearing weight, difficulty sleeping  He is also on polypharmacy treatment due to his mental health, carried a diagnosis of bipolar, schizophrenia  I personally reviewed MRI of the brain without contrast in July 2020, no acute abnormality, generalized atrophy with frontal and temporal predominance, mild supratentorium small vessel disease  CT angiogram of head and neck in July 2020 showed no large vessel disease.  I reviewed the laboratory evaluation dated July 11, 2019, normal CBC, hemoglobin of 13.4, CMP, creatinine was elevated 1.84, estimated GFR of 38, lipase was 25  UPDATE October 22 2019: Patient return for electrodiagnostic study today, which confirmed no evidence of length  dependent mild sensorimotor polyneuropathy  Extensive laboratory evaluations showed B12 deficiency 196, which is confirmed by repeat B12 level 185, there is also evidence of elevated methylmalonic acid level 697, homocystine level was 21.8  Rest of the laboratory evaluation also normal E7M 5.5, normal folic acid, CBC, HIV, ANA, ESR, liver functional test, TSH, protein electrophoresis, showed mildly decreased IgA level 72, rest of the laboratory evaluation was normal, there was no M protein spike,  He was given vitamin B12 shots, noticed mild improvement, continue have bilateral feet and lower extremity paresthesia,  Today she also reported that she was involved in a class lawsuit with [employer] due to side effect from long-term Nexium use,   REVIEW OF SYSTEMS: Full 14 system review of systems performed and notable only for as above. All other review of systems were negative.  ALLERGIES: Allergies  Allergen Reactions   Methylpyrrolidone Hives    froze the intestine   Niacin Other (See Comments) and Nausea And Vomiting    Flushing, itching, tingling    Norvasc [Amlodipine Besylate] Other (See Comments)    Swollen Feet   Oxybutynin Chloride [Oxybutynin Chloride Er] Other (See Comments)    froze the intestine   Vesicare [Solifenacin Succinate] Other (See Comments)    Froze the intestine    Ciprofloxacin Rash and Other (See Comments)    Felt flushed    Oxybutynin Rash   Solifenacin Rash    HOME MEDICATIONS: Current Outpatient Medications  Medication Sig Dispense Refill   acetaminophen (TYLENOL) 325 MG tablet Take 650 mg by mouth every 6 (six) hours as needed for mild pain or headache.     albuterol (PROVENTIL HFA;VENTOLIN HFA) 108 (90 BASE) MCG/ACT inhaler  Inhale 2 puffs into the lungs every 6 (six) hours as needed for wheezing or shortness of breath.      benazepril (LOTENSIN) 10 MG tablet Take 1 tablet (10 mg total) by mouth daily. 90 tablet 1    budesonide-formoterol (SYMBICORT) 80-4.5 MCG/ACT inhaler Inhale 2 puffs into the lungs 2 (two) times daily. 1 Inhaler 11   buPROPion (WELLBUTRIN SR) 150 MG 12 hr tablet Take 150 mg by mouth 2 (two) times daily.      cloNIDine (CATAPRES) 0.2 MG tablet Take 1 tablet (0.2 mg total) by mouth 2 (two) times daily. 60 tablet 2   fluticasone (FLONASE) 50 MCG/ACT nasal spray Place 1 spray into both nostrils daily.     gabapentin (NEURONTIN) 100 MG capsule Take 1 capsule (100 mg total) by mouth 3 (three) times daily. 90 capsule 11   hydrALAZINE (APRESOLINE) 50 MG tablet Take 1 tablet (50 mg total) by mouth 3 (three) times daily. 270 tablet 2   isosorbide dinitrate (ISORDIL) 30 MG tablet TAKE 1 TABLET(30 MG) BY MOUTH FOUR TIMES DAILY 368 tablet 3   metoprolol tartrate (LOPRESSOR) 100 MG tablet TAKE 1 TABLET BY MOUTH TWICE DAILY 180 tablet 0   montelukast (SINGULAIR) 10 MG tablet Take 10 mg by mouth at bedtime.     ondansetron (ZOFRAN ODT) 4 MG disintegrating tablet Take 1 tablet (4 mg total) by mouth every 8 (eight) hours as needed for nausea or vomiting. 20 tablet 0   paliperidone (INVEGA) 6 MG 24 hr tablet Take 6 mg by mouth at bedtime.     pantoprazole (PROTONIX) 40 MG tablet Take 1 tablet (40 mg total) by mouth daily. 30 tablet 0   tamsulosin (FLOMAX) 0.4 MG CAPS capsule Take 1 capsule (0.4 mg total) by mouth daily. 30 capsule 6   No current facility-administered medications for this visit.   Facility-Administered Medications Ordered in Other Visits  Medication Dose Route Frequency Provider Last Rate Last Admin   acetaminophen (OFIRMEV) IVPB    PRN Lissa Morales, CRNA   1,000 mg at 04/19/11 0910   ePHEDrine injection    PRN Lissa Morales, CRNA   5 mg at 04/19/11 1018   glycopyrrolate (ROBINUL) injection    PRN Lissa Morales, CRNA   0.8 mg at 04/19/11 1037   SUFentanil (SUFENTA) injection    PRN Lissa Morales, CRNA   10 mcg at 04/19/11 1008    PAST MEDICAL HISTORY: Past Medical  History:  Diagnosis Date   Asthma    uses inhalers    Bilateral carotid bruits    Cardiac conduction disorder 2018   s/p MDT PPM   Chronic kidney disease    bladder interstial cystitis    Chronic kidney disease (CKD), stage IV (severe) ([place])    followed by Dr. Joelyn Oms at [facility]   Depression    Encounter for care of pacemaker 02/13/2019   GERD (gastroesophageal reflux disease)    History of stomach ulcers 2001   Hypertension    LBBB (left bundle branch block)    Lower extremity edema    Mild intermittent asthma without complication    Mixed hyperlipidemia    Mobitz type 2 second degree AV block 04/06/2019   Neuropathy    Pacemaker: Medtronic Azure XT DR MRI [id number] PPM -  BUNDLE OF HIS pacing  02/26/2017   Scheduled Remote pacemaker check  11/12/2018:  There were 24 Fast AV episodes:  EGMs show SVTs. Episodes lasted < 2 minutes. Health trends do  not demonstrate significant abnormality. Battery longevity is 9.4 - 10.3 years. RA pacing is 47.1 %, RV pacing is 40.4 %.  Clinic check 11/07/17.    Paroxysmal atrial flutter ([name])    PONV (postoperative nausea and vomiting)    Prostatitis    Schizophrenia ([place])    Sinus node dysfunction ([place]) 02/13/2019    PAST SURGICAL HISTORY: Past Surgical History:  Procedure Laterality Date   APPENDECTOMY     ELECTROPHYSIOLOGY STUDY N/A 02/26/2017   Procedure: ELECTROPHYSIOLOGY STUDY;  Surgeon: Evans Lance, MD;  Location: [place] [facility];  Service: Cardiovascular;  Laterality: N/A;   ESOPHAGOGASTRODUODENOSCOPY (EGD) WITH PROPOFOL N/A 09/18/2016   Procedure: ESOPHAGOGASTRODUODENOSCOPY (EGD) WITH PROPOFOL;  Surgeon: Ronnette Juniper, MD;  Location: [facility];  Service: Gastroenterology;  Laterality: N/A;   LUMBAR LAMINECTOMY/DECOMPRESSION MICRODISCECTOMY  04/19/2011   Procedure: LUMBAR LAMINECTOMY/DECOMPRESSION MICRODISCECTOMY;  Surgeon: Tobi Bastos;  Location: [facility];  Service: Orthopedics;  Laterality: Left;   Hemi LAminectomy/Microdiscectomy Lumbar four  - Lumbar five  on the Left (X-Ray)   PACEMAKER IMPLANT N/A 02/26/2017   Procedure: PACEMAKER IMPLANT;  Surgeon: Evans Lance, MD;  Location: [place];  Service: Cardiovascular;  Laterality: N/A;   TONSILLECTOMY      FAMILY HISTORY: Family History  Problem Relation Age of Onset   High blood pressure Mother    Alzheimer's disease Father     SOCIAL HISTORY: Social History   Socioeconomic History   Marital status: Married    Spouse name: Not on file   Number of children: 1   Years of education: 12   Highest education level: High school graduate  Occupational History   Occupation: Retired  Tobacco Use   Smoking status: Former Smoker    Packs/day: 1.00    Years: 30.00    Pack years: 30.00    Quit date: 05/08/2008    Years since quitting: 11.4   Smokeless tobacco: Never Used  Scientific laboratory technician Use: Never used  Substance and Sexual Activity   Alcohol use: No   Drug use: No   Sexual activity: Never  Other Topics Concern   Not on file  Social History Narrative   Lives at home with wife.   Right-handed.   One cup caffeine per day.   Social Determinants of Health   Financial Resource Strain:    Difficulty of Paying Living Expenses:   Food Insecurity:    Worried About Charity fundraiser in the Last Year:    Arboriculturist in the Last Year:   Transportation Needs:    Film/video editor (Medical):    Lack of Transportation (Non-Medical):   Physical Activity:    Days of Exercise per Week:    Minutes of Exercise per Session:   Stress:    Feeling of Stress :   Social Connections:    Frequency of Communication with Friends and Family:    Frequency of Social Gatherings with Friends and Family:    Attends Religious Services:    Active Member of Clubs or Organizations:    Attends Music therapist:    Marital Status:   Intimate Partner Violence:    Fear of Current  or Ex-Partner:    Emotionally Abused:    Physically Abused:    Sexually Abused:      PHYSICAL EXAM   There were no vitals filed for this visit. Not recorded     There is no height or weight on file to  calculate BMI.  PHYSICAL EXAMNIATION:  Gen: NAD, conversant, well nourised, well groomed                     Cardiovascular: Regular rate rhythm, no peripheral edema, warm, nontender. Eyes: Conjunctivae clear without exudates or hemorrhage Neck: Supple, no carotid bruits. Pulmonary: Clear to auscultation bilaterally   NEUROLOGICAL EXAM:  MENTAL STATUS: Speech:    Speech is normal; fluent and spontaneous with normal comprehension.  Cognition:     Orientation to time, place and person     Normal recent and remote memory     Normal Attention span and concentration     Normal Language, naming, repeating,spontaneous speech     Fund of knowledge   CRANIAL NERVES: CN II: Visual fields are full to confrontation. Pupils are round equal and briskly reactive to light. CN III, IV, VI: extraocular movement are normal. No ptosis. CN V: Facial sensation is intact to light touch CN VII: Face is symmetric with normal eye closure  CN VIII: Hearing is normal to causal conversation. CN IX, X: Phonation is normal. CN XI: Head turning and shoulder shrug are intact  MOTOR: There is no pronator drift of out-stretched arms. Muscle bulk and tone are normal. Muscle strength is normal.    REFLEXES: Reflexes are 2+ and symmetric at the biceps, triceps, knees, and absent at ankles. Plantar responses are flexor.  SENSORY: Mildly length dependent decreased vibratory sensation to ankle level, mildly decreased light touch, pinprick to mid shin level  COORDINATION: There is no trunk or limb dysmetria noted.  GAIT/STANCE: He can get up from seated position arms crossed, mildly antalgic, also limited by his big body habitus, able to stand up on tiptoe, heels, Romberg sign was  negative   DIAGNOSTIC DATA (LABS, IMAGING, TESTING) - I reviewed patient records, labs, notes, testing and imaging myself where available.   ASSESSMENT AND PLAN  Peter Marsh is a 59 y.o. male   A year history of gradual onset bilateral feet paresthesia  Mildly length dependent sensory changes on examination, absent ankle reflexes  Differentiation diagnosis today confirmed a diagnosis of mild length dependent axonal polyneuropathy  He has pacemaker, is MRI candidate but need to go through special reprogramming before and after MRI  Laboratory evaluation for potential etiology of peripheral neuropathy showed mildly elevated A1c up to 5.7, vitamin B12 deficiency  He also has a long history of chronic low back pain, management of spine physician, reported last imaging study was 3 months ago, he was told to be a potential surgical candidate, he wants to hold off on that  Continue gabapentin 100 mg 3 times a day  Vitamin B12 deficiency  Continue B12 supplement  Marcial Pacas, M.D. Ph.D.  [facility] [address], [place], [place] [address] Ph: [phone] Fax: [phone]  CC: Geralynn Rile, MD, Marda Stalker, PA-C

## 2019-10-29 ENCOUNTER — Ambulatory Visit: Payer: PPO | Admitting: Cardiovascular Disease

## 2019-11-17 ENCOUNTER — Ambulatory Visit: Payer: PPO | Admitting: Gastroenterology

## 2019-11-17 ENCOUNTER — Telehealth: Payer: Self-pay | Admitting: Gastroenterology

## 2019-11-17 NOTE — Telephone Encounter (Signed)
Peter Marsh, this pt cancelled his appt he had for today with Dr Tarri Glenn 2:30PM due to him being sick and experiencing a fever.

## 2019-11-17 NOTE — Telephone Encounter (Signed)
Thank you for the information. I do not have an established relationship with this patient. He may reschedule if he still would like to be seen in consultation.

## 2019-12-08 ENCOUNTER — Emergency Department (HOSPITAL_COMMUNITY): Payer: PPO

## 2019-12-08 ENCOUNTER — Emergency Department (HOSPITAL_COMMUNITY)
Admission: EM | Admit: 2019-12-08 | Discharge: 2019-12-08 | Disposition: A | Discharge status: Home / Self Care | Payer: PPO | Attending: Emergency Medicine | Admitting: Emergency Medicine

## 2019-12-08 ENCOUNTER — Other Ambulatory Visit: Payer: Self-pay

## 2019-12-08 DIAGNOSIS — J45909 Unspecified asthma, uncomplicated: Secondary | ICD-10-CM | POA: Diagnosis not present

## 2019-12-08 DIAGNOSIS — R0689 Other abnormalities of breathing: Secondary | ICD-10-CM | POA: Diagnosis not present

## 2019-12-08 DIAGNOSIS — I10 Essential (primary) hypertension: Secondary | ICD-10-CM

## 2019-12-08 DIAGNOSIS — Z79899 Other long term (current) drug therapy: Secondary | ICD-10-CM | POA: Diagnosis not present

## 2019-12-08 DIAGNOSIS — Z87891 Personal history of nicotine dependence: Secondary | ICD-10-CM | POA: Insufficient documentation

## 2019-12-08 DIAGNOSIS — N184 Chronic kidney disease, stage 4 (severe): Secondary | ICD-10-CM | POA: Insufficient documentation

## 2019-12-08 DIAGNOSIS — R079 Chest pain, unspecified: Secondary | ICD-10-CM | POA: Diagnosis not present

## 2019-12-08 DIAGNOSIS — N183 Chronic kidney disease, stage 3 unspecified: Secondary | ICD-10-CM | POA: Diagnosis not present

## 2019-12-08 DIAGNOSIS — I447 Left bundle-branch block, unspecified: Secondary | ICD-10-CM | POA: Diagnosis not present

## 2019-12-08 DIAGNOSIS — R0789 Other chest pain: Secondary | ICD-10-CM | POA: Diagnosis not present

## 2019-12-08 DIAGNOSIS — I129 Hypertensive chronic kidney disease with stage 1 through stage 4 chronic kidney disease, or unspecified chronic kidney disease: Secondary | ICD-10-CM | POA: Diagnosis not present

## 2019-12-08 DIAGNOSIS — R42 Dizziness and giddiness: Secondary | ICD-10-CM | POA: Insufficient documentation

## 2019-12-08 DIAGNOSIS — R0602 Shortness of breath: Secondary | ICD-10-CM | POA: Insufficient documentation

## 2019-12-08 DIAGNOSIS — Z20822 Contact with and (suspected) exposure to covid-19: Secondary | ICD-10-CM | POA: Insufficient documentation

## 2019-12-08 DIAGNOSIS — J449 Chronic obstructive pulmonary disease, unspecified: Secondary | ICD-10-CM | POA: Insufficient documentation

## 2019-12-08 DIAGNOSIS — J439 Emphysema, unspecified: Secondary | ICD-10-CM | POA: Diagnosis not present

## 2019-12-08 DIAGNOSIS — R519 Headache, unspecified: Secondary | ICD-10-CM | POA: Insufficient documentation

## 2019-12-08 LAB — CBC WITH DIFFERENTIAL/PLATELET
Abs Immature Granulocytes: 0.02 10*3/uL (ref 0.00–0.07)
Basophils Absolute: 0 10*3/uL (ref 0.0–0.1)
Basophils Relative: 1 %
Eosinophils Absolute: 0.5 10*3/uL (ref 0.0–0.5)
Eosinophils Relative: 7 %
HCT: 44.5 % (ref 39.0–52.0)
Hemoglobin: 14 g/dL (ref 13.0–17.0)
Immature Granulocytes: 0 %
Lymphocytes Relative: 34 %
Lymphs Abs: 2.4 10*3/uL (ref 0.7–4.0)
MCH: 27.9 pg (ref 26.0–34.0)
MCHC: 31.5 g/dL (ref 30.0–36.0)
MCV: 88.8 fL (ref 80.0–100.0)
Monocytes Absolute: 0.7 10*3/uL (ref 0.1–1.0)
Monocytes Relative: 10 %
Neutro Abs: 3.5 10*3/uL (ref 1.7–7.7)
Neutrophils Relative %: 48 %
Platelets: 205 10*3/uL (ref 150–400)
RBC: 5.01 MIL/uL (ref 4.22–5.81)
RDW: 12.6 % (ref 11.5–15.5)
WBC: 7.1 10*3/uL (ref 4.0–10.5)
nRBC: 0 % (ref 0.0–0.2)

## 2019-12-08 LAB — CBG MONITORING, ED: Glucose-Capillary: 103 mg/dL — ABNORMAL HIGH (ref 70–99)

## 2019-12-08 LAB — BASIC METABOLIC PANEL
Anion gap: 9 (ref 5–15)
BUN: 14 mg/dL (ref 6–20)
CO2: 24 mmol/L (ref 22–32)
Calcium: 9.3 mg/dL (ref 8.9–10.3)
Chloride: 104 mmol/L (ref 98–111)
Creatinine, Ser: 2.04 mg/dL — ABNORMAL HIGH (ref 0.61–1.24)
GFR calc Af Amer: 40 mL/min — ABNORMAL LOW (ref >60)
GFR calc non Af Amer: 34 mL/min — ABNORMAL LOW (ref >60)
Glucose, Bld: 107 mg/dL — ABNORMAL HIGH (ref 70–99)
Potassium: 3.8 mmol/L (ref 3.5–5.1)
Sodium: 137 mmol/L (ref 135–145)

## 2019-12-08 LAB — TROPONIN I (HIGH SENSITIVITY)
Troponin I (High Sensitivity): 6 ng/L (ref <18)
Troponin I (High Sensitivity): 9 ng/L (ref <18)

## 2019-12-08 LAB — BRAIN NATRIURETIC PEPTIDE: B Natriuretic Peptide: 100.4 pg/mL — ABNORMAL HIGH (ref 0.0–100.0)

## 2019-12-08 LAB — SARS CORONAVIRUS 2 BY RT PCR (HOSPITAL ORDER, PERFORMED IN ~~LOC~~ HOSPITAL LAB): SARS Coronavirus 2: NEGATIVE

## 2019-12-08 MED ORDER — ALBUTEROL SULFATE HFA 108 (90 BASE) MCG/ACT IN AERS
4.0000 | INHALATION_SPRAY | Freq: Once | RESPIRATORY_TRACT | Status: AC
  Administered 2019-12-08: 4 via RESPIRATORY_TRACT
  Filled 2019-12-08: qty 6.7

## 2019-12-08 MED ORDER — IPRATROPIUM BROMIDE HFA 17 MCG/ACT IN AERS
2.0000 | INHALATION_SPRAY | Freq: Once | RESPIRATORY_TRACT | Status: AC
  Administered 2019-12-08: 2 via RESPIRATORY_TRACT
  Filled 2019-12-08: qty 12.9

## 2019-12-08 NOTE — Discharge Instructions (Addendum)
Please follow up with your PCP as soon as possible. I am glad that you are feeling better.

## 2019-12-08 NOTE — ED Notes (Signed)
Pt back from X-ray.  

## 2019-12-08 NOTE — ED Notes (Signed)
Pt ambulated to the restroom with a steady gait. Pt did not complain of having any ShOB and upon returning to the stretcher, pt's O2 was at 98% and is maintaining that. RN, April, notified.

## 2019-12-08 NOTE — ED Triage Notes (Addendum)
Pt brought to ED via EMS from home with c/o increasing shortness of breath x3 days. Pt also reports previous intermittent dull chest pain rated 7/10, centrally located, non radiating; resolved yesterday. Pt reports fevers at home, tylenol taken this AM, currently afebrile in ED. COVID vaccine complete. Currently A&O x 4, hypertensive, 99% on room air. Denies pain.  EMS v/s: 230/130 89 CBG 64 HR 98% on RA with diminished lower lobes

## 2019-12-08 NOTE — ED Provider Notes (Signed)
[facility] EMERGENCY DEPARTMENT Provider Note   CSN: [id number] Arrival date & time: 12/08/19  0737     History Chief Complaint  Patient presents with  . Shortness of Breath    Peter Marsh is a 59 y.o. male past history significant for CKD 4, COPD, hypertension, schizophrenia, sinus node dysfunction status post pacemaker in 2018, chronic back pain and recently diagnosed idiopathic peripheral neuropathy, presenting with acute onset chest pain and shortness of breath. Patient reports that he started feeling malaised with some upper respiratory symptoms (rhinorrhea, dry cough) on Friday, 3 days ago.  Patient reports acute onset shortness of breath this morning with orthopnea when waking up.  He does note some productive coughing but mostly dry.  He also endorsed chest pain that started mildly yesterday and recurred this morning with nausea.  Chest pain is described as midsternal pressure.  He does have some soreness over his pacemaker site as well. New recent medications include gabapentin for idiopathic peripheral neuropathy.  Patient does report taking his blood pressure medications this morning.  Review of systems is also notable for intermittent headaches in the frontal lobe, both vision making it difficult for him to see the TV even with his glasses on, and frequent urination over the weekend.  He also notes some dizziness as well.     Past Medical History:  Diagnosis Date  . Asthma    uses inhalers   . Bilateral carotid bruits   . Cardiac conduction disorder 2018   s/p MDT PPM  . Chronic kidney disease    bladder interstial cystitis   . Chronic kidney disease (CKD), stage IV (severe) ([name])    followed by Dr. Joelyn Oms at [facility]  . Depression   . Encounter for care of pacemaker 02/13/2019  . GERD (gastroesophageal reflux disease)   . History of stomach ulcers 2001  . Hypertension   . LBBB (left bundle branch block)   . Lower extremity edema   .  Mild intermittent asthma without complication   . Mixed hyperlipidemia   . Mobitz type 2 second degree AV block 04/06/2019  . Neuropathy   . Pacemaker: Medtronic Azure XT DR MRI [id number] PPM -  BUNDLE OF HIS pacing  02/26/2017   Scheduled Remote pacemaker check  11/12/2018:  There were 24 Fast AV episodes:  EGMs show SVTs. Episodes lasted < 2 minutes. Health trends do not demonstrate significant abnormality. Battery longevity is 9.4 - 10.3 years. RA pacing is 47.1 %, RV pacing is 40.4 %.  Clinic check 11/07/17.   . Paroxysmal atrial flutter ([place])   . PONV (postoperative nausea and vomiting)   . Prostatitis   . Schizophrenia ([place])   . Sinus node dysfunction ([place]) 02/13/2019    Patient Active Problem List   Diagnosis Date Noted  . Gait abnormality 10/22/2019  . Vitamin B12 deficiency 10/22/2019  . Idiopathic peripheral neuropathy 10/22/2019  . Paresthesia 09/08/2019  . Chronic low back pain with sciatica 09/08/2019  . Mobitz type 2 second degree AV block 04/06/2019  . Encounter for care of pacemaker 02/13/2019  . Sinus node dysfunction ([place]) 02/13/2019  . Encephalopathy 11/19/2018  . GERD (gastroesophageal reflux disease) 11/19/2018  . Essential hypertension 06/24/2018  . Wide QRS ventricular tachycardia ([place]) 02/26/2017  . 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 CKD (chronic kidney disease), stage III ([place]) 09/16/2016  . Schizophrenia ([place]) 09/16/2016  . Upper GI bleed 09/16/2016  .  Asthma 02/09/2016  . Herniated lumbar intervertebral disc 04/20/2011    Past Surgical History:  Procedure Laterality Date  . APPENDECTOMY    . ELECTROPHYSIOLOGY STUDY N/A 02/26/2017   Procedure: ELECTROPHYSIOLOGY STUDY;  Surgeon: Evans Lance, MD;  Location: [facility];  Service: Cardiovascular;  Laterality: N/A;  . ESOPHAGOGASTRODUODENOSCOPY (EGD) WITH PROPOFOL N/A 09/18/2016   Procedure: ESOPHAGOGASTRODUODENOSCOPY (EGD) WITH PROPOFOL;   Surgeon: Ronnette Juniper, MD;  Location: [facility];  Service: Gastroenterology;  Laterality: N/A;  . LUMBAR LAMINECTOMY/DECOMPRESSION MICRODISCECTOMY  04/19/2011   Procedure: LUMBAR LAMINECTOMY/DECOMPRESSION MICRODISCECTOMY;  Surgeon: Tobi Bastos;  Location: [facility];  Service: Orthopedics;  Laterality: Left;  Hemi LAminectomy/Microdiscectomy Lumbar four  - Lumbar five  on the Left (X-Ray)  . PACEMAKER IMPLANT N/A 02/26/2017   Procedure: PACEMAKER IMPLANT;  Surgeon: Evans Lance, MD;  Location: [place] [facility];  Service: Cardiovascular;  Laterality: N/A;  . TONSILLECTOMY         Family History  Problem Relation Age of Onset  . High blood pressure Mother   . Alzheimer's disease Father     Social History   Tobacco Use  . Smoking status: Former Smoker    Packs/day: 1.00    Years: 30.00    Pack years: 30.00    Quit date: 05/08/2008    Years since quitting: 11.5  . Smokeless tobacco: Never Used  Vaping Use  . Vaping Use: Never used  Substance Use Topics  . Alcohol use: No  . Drug use: No    Home Medications Prior to Admission medications   Medication Sig Start Date End Date Taking? Authorizing Provider  acetaminophen (TYLENOL) 325 MG tablet Take 650 mg by mouth every 6 (six) hours as needed for mild pain or headache.   Yes [provider]  albuterol (PROVENTIL HFA;VENTOLIN HFA) 108 (90 BASE) MCG/ACT inhaler Inhale 2 puffs into the lungs every 6 (six) hours as needed for wheezing or shortness of breath.    Yes [provider]  budesonide-formoterol (SYMBICORT) 80-4.5 MCG/ACT inhaler Inhale 2 puffs into the lungs 2 (two) times daily. 02/09/16  Yes Tanda Rockers, MD  buPROPion [facility] SR) 150 MG 12 hr tablet Take 150 mg by mouth 2 (two) times daily.    Yes [provider]  cloNIDine (CATAPRES) 0.2 MG tablet Take 1 tablet (0.2 mg total) by mouth 2 (two) times daily. 09/17/19 12/16/19 Yes Evans Lance, MD  Cyanocobalamin (VITAMIN B-12 PO) Take 1  tablet by mouth daily.   Yes [provider]  fluticasone (FLONASE) 50 MCG/ACT nasal spray Place 1 spray into both nostrils daily as needed for allergies.  09/11/19  Yes [provider]  gabapentin (NEURONTIN) 100 MG capsule Take 1 capsule (100 mg total) by mouth 3 (three) times daily. 09/08/19  Yes Marcial Pacas, MD  hydrALAZINE (APRESOLINE) 50 MG tablet Take 1 tablet (50 mg total) by mouth 3 (three) times daily. 02/10/19  Yes Adrian Prows, MD  isosorbide dinitrate (ISORDIL) 30 MG tablet TAKE 1 TABLET(30 MG) BY MOUTH FOUR TIMES DAILY Patient taking differently: Take 30 mg by mouth 4 (four) times daily.  03/19/19  Yes Adrian Prows, MD  metoprolol tartrate (LOPRESSOR) 100 MG tablet TAKE 1 TABLET BY MOUTH TWICE DAILY Patient taking differently: Take 100 mg by mouth 2 (two) times daily.  08/22/19  Yes Adrian Prows, MD  montelukast (SINGULAIR) 10 MG tablet Take 10 mg by mouth at bedtime. 01/17/16  Yes [provider]  paliperidone (INVEGA) 6 MG 24  hr tablet Take 6 mg by mouth at bedtime.   Yes [provider]  pantoprazole (PROTONIX) 40 MG tablet Take 1 tablet (40 mg total) by mouth daily. Patient taking differently: Take 40 mg by mouth 2 (two) times daily.  04/23/19 12/08/19 Yes Albrizze, Kaitlyn E, PA-C  tamsulosin (FLOMAX) 0.4 MG CAPS capsule Take 1 capsule (0.4 mg total) by mouth daily. 09/17/19  Yes Evans Lance, MD  benazepril (LOTENSIN) 10 MG tablet Take 1 tablet (10 mg total) by mouth daily. Patient not taking: Reported on 12/08/2019 04/15/19   Adrian Prows, MD  benazepril (LOTENSIN) 20 MG tablet Take 1 tablet by mouth 2 (two) times daily. 12/04/19   [provider]  ondansetron (ZOFRAN ODT) 4 MG disintegrating tablet Take 1 tablet (4 mg total) by mouth every 8 (eight) hours as needed for nausea or vomiting. Patient not taking: Reported on 12/08/2019 09/18/19   Deno Etienne, DO   Allergies    Methylpyrrolidone, Niacin, Norvasc [amlodipine besylate], Oxybutynin chloride  [oxybutynin chloride er], Vesicare [solifenacin succinate], Ciprofloxacin, Oxybutynin, and Solifenacin  Review of Systems   Review of Systems  Constitutional: Positive for chills, fatigue and fever.  HENT: Positive for rhinorrhea and sore throat.   Eyes: Positive for visual disturbance. Negative for discharge and itching.  Respiratory: Positive for cough and shortness of breath.   Cardiovascular: Positive for chest pain. Negative for palpitations and leg swelling.  Gastrointestinal: Positive for constipation and nausea. Negative for abdominal pain, blood in stool, diarrhea, rectal pain and vomiting.  Endocrine: Positive for polyuria.  Genitourinary: Positive for frequency. Negative for difficulty urinating and dysuria.  Musculoskeletal: Positive for back pain. Negative for neck stiffness.  Skin: Negative for color change.  Neurological: Positive for dizziness and headaches. Negative for seizures, syncope, facial asymmetry, speech difficulty and numbness.  Psychiatric/Behavioral: Negative for agitation and behavioral problems.    Physical Exam Updated Vital Signs BP (!) 173/86   Pulse 61   Temp 97.7 F (36.5 C) (Oral)   Resp (!) 22   Ht 6' (1.829 m)   Wt (!) 113.4 kg   SpO2 98%   BMI 33.91 kg/m   Physical Exam Vitals reviewed.  Constitutional:      General: He is not in acute distress.    Appearance: He is obese. He is not ill-appearing, toxic-appearing or diaphoretic.  HENT:     Head: Normocephalic and atraumatic.     Mouth/Throat:     Mouth: Mucous membranes are moist.     Pharynx: Oropharynx is clear. No pharyngeal swelling or oropharyngeal exudate.  Eyes:     Pupils: Pupils are equal, round, and reactive to light.     Comments: Unable to perform EOMI due to subjective dizziness. Will attempt again.   Neck:     Vascular: No JVD.  Cardiovascular:     Rate and Rhythm: Normal rate and regular rhythm.     Pulses: No decreased pulses.     Heart sounds: No murmur heard.    No gallop.      Comments: Heart sounds distant Pulmonary:     Effort: Pulmonary effort is normal. Tachypnea present. No accessory muscle usage.     Breath sounds: No stridor. Examination of the left-upper field reveals wheezing. Examination of the right-lower field reveals decreased breath sounds. Decreased breath sounds and wheezing present. No rhonchi or rales.  Chest:     Chest wall: Tenderness present. No mass, deformity or crepitus.  Abdominal:     General: Bowel sounds are  normal.     Palpations: Abdomen is soft.     Tenderness: There is no guarding or rebound.  Musculoskeletal:     Cervical back: Normal range of motion and neck supple.     Right lower leg: Tenderness present. No edema.     Left lower leg: Tenderness present. No edema.  Lymphadenopathy:     Cervical: No cervical adenopathy.  Skin:    General: Skin is warm.     Capillary Refill: Capillary refill takes less than 2 seconds.     Nails: There is no clubbing.  Neurological:     Mental Status: He is alert and oriented to person, place, and time. Mental status is at baseline.     Cranial Nerves: Cranial nerves are intact. No facial asymmetry.     Sensory: Sensation is intact.     Motor: No weakness, atrophy, seizure activity or pronator drift.     Coordination: Coordination normal. Heel to Shin Test normal.     Deep Tendon Reflexes:     Reflex Scores:      Brachioradialis reflexes are 2+ on the right side and 2+ on the left side.      Patellar reflexes are 2+ on the right side and 2+ on the left side.      Achilles reflexes are 1+ on the right side and 1+ on the left side.    Comments:    Psychiatric:        Mood and Affect: Mood normal.        Behavior: Behavior normal.     ED Results / Procedures / Treatments   Labs (all labs ordered are listed, but only abnormal results are displayed) Labs Reviewed  BASIC METABOLIC PANEL - Abnormal; Notable for the following components:      Result Value   Glucose, Bld  107 (*)    Creatinine, Ser 2.04 (*)    GFR calc non Af Amer 34 (*)    GFR calc Af Amer 40 (*)    All other components within normal limits  BRAIN NATRIURETIC PEPTIDE - Abnormal; Notable for the following components:   B Natriuretic Peptide 100.4 (*)    All other components within normal limits  CBG MONITORING, ED - Abnormal; Notable for the following components:   Glucose-Capillary 103 (*)    All other components within normal limits  SARS CORONAVIRUS 2 BY RT PCR (HOSPITAL ORDER, [facility])  CBC WITH DIFFERENTIAL/PLATELET  TROPONIN I (HIGH SENSITIVITY)  TROPONIN I (HIGH SENSITIVITY)    EKG EKG Interpretation  Date/Time:  Monday December 08 2019 08:06:37 EDT Ventricular Rate:  67 PR Interval:    QRS Duration: 177 QT Interval:  454 QTC Calculation: 480 R Axis:   59 Text Interpretation: Atrial-paced rhythm Prolonged PR interval Left bundle branch block Confirmed by Lajean Saver [phone]) on 12/08/2019 8:20:40 AM   Radiology DG Chest 2 View  Result Date: 12/08/2019 CLINICAL DATA:  Shortness of breath EXAM: CHEST - 2 VIEW COMPARISON:  November 19, 2018 FINDINGS: The cardiomediastinal silhouette is unchanged and enlarged in contour.LEFT chest cardiac pacing device. Aortic atherosclerosis. Emphysematous changes of the apices. Unchanged biapical pleuroparenchymal scarring. No pleural effusion. No pneumothorax. No acute pleuroparenchymal abnormality. Visualized abdomen is unremarkable. Multilevel degenerative changes of the thoracic spine. IMPRESSION: No acute cardiopulmonary abnormality. Electronically Signed   By: Valentino Saxon MD   On: 12/08/2019 09:10    Procedures Procedures (including critical care time)  Medications Ordered in ED Medications  albuterol (VENTOLIN HFA) 108 (90 Base) MCG/ACT inhaler 4 puff (4 puffs Inhalation Given 12/08/19 0953)  ipratropium (ATROVENT HFA) inhaler 2 puff (2 puffs Inhalation Given 12/08/19 7357)    ED Course  I have  reviewed the triage vital signs and the nursing notes.  Pertinent labs & imaging results that were available during my care of the patient were reviewed by me and considered in my medical decision making (see chart for details).    MDM Rules/Calculators/A&P                          59 year old patient with past medical history significant for CKD 4, COPD, sinus node dysfunction s/p pacemaker, schizophrenia, hypertension, chronic back pain who is presenting with elevated blood pressures in the low 200s over 110s with no acute signs or symptoms of end organ damage.  Subjectively, he complains of intermittent chest pain as well as some new onset shortness of breath and orthopnea this morning.  On physical exam, vital signs show continued elevated blood pressure despite taking blood pressure medications this morning.  His heart sounds are distant with decreased pulses bilaterally.  No lower extremity edema.  Lung sounds are decreased throughout though markedly decreased on right lower lobe with generalized expiratory wheezes.  With exertion, patient remains 99%-100% on room air even with obvious tachypnea.  EKG shows LBBB and atrially paced rhythm. No new signficant findings since last exam. Initial work-up will include basic labs, troponin, chest x-ray. No FND except for difficulty tracking EOMI exam. Can track finger, but only slowly.   Cbc wnl, lesser concern for infection. BMP s/f Cr 2.04 and GFR 34 (stable). Initial troponin 6. BP 181/105, RR 16, GR 63, 99% on RA. CXR wnl.   BNP 100.4 (89.4 in 2020). Second troponin negative.   Patient seen at bedside and reports great improvement in his symptoms. SOB resolved with inhalers. Blood pressure also improving 173/86. Patient with history of labile blood pressure and following closely with cardiology. Physical exam and labs all negative for signs of end organ damage 2/2 blood pressure.   Patient agreeable to follow closely with cardiology and PCP. No  further questions or concerned. Patient and wife looking forward to discharge from ED   Final Clinical Impression(s) / ED Diagnoses Final diagnoses:  SOB (shortness of breath)  Other chest pain  Essential hypertension    Rx / DC Orders ED Discharge Orders    None      Wilber Oliphant, M.D.  1:17 PM 12/08/2019     Wilber Oliphant, MD 12/08/19 1318    Lajean Saver, MD 12/11/19 1049

## 2019-12-10 ENCOUNTER — Encounter: Payer: Self-pay | Admitting: Nurse Practitioner

## 2019-12-10 ENCOUNTER — Other Ambulatory Visit: Payer: Self-pay

## 2019-12-10 ENCOUNTER — Telehealth (INDEPENDENT_AMBULATORY_CARE_PROVIDER_SITE_OTHER): Payer: PPO | Admitting: Nurse Practitioner

## 2019-12-10 DIAGNOSIS — K219 Gastro-esophageal reflux disease without esophagitis: Secondary | ICD-10-CM

## 2019-12-10 DIAGNOSIS — R0602 Shortness of breath: Secondary | ICD-10-CM

## 2019-12-10 MED ORDER — SUCRALFATE 1 GM/10ML PO SUSP
1.0000 g | Freq: Three times a day (TID) | ORAL | 0 refills | Status: DC
Start: 2019-12-10 — End: 2019-12-11

## 2019-12-10 NOTE — Progress Notes (Signed)
Peter Marsh, [place]  [address] Phone:  [phone]   Fax:  [phone]     Virtual telephone visit      Virtual Visit via Telephone Note   This visit type was conducted due to national recommendations for restrictions regarding the COVID-19 Pandemic (e.g. social distancing) in an effort to limit this patient's exposure and mitigate transmission in our community. Due to his co-morbid illnesses, this patient is at least at moderate risk for complications without adequate follow up. This format is felt to be most appropriate for this patient at this time. The patient did not have access to video technology or had technical difficulties with video requiring transitioning to audio format only (telephone). Physical exam was limited to content and character of the telephone converstion.    Patient location: home Provider location: office    Patient: Peter Marsh   DOB: Feb 17, 1961   59 y.o. Male  MRN: [id number] Visit Date: 12/11/2019  Today's Provider: Vevelyn Francois, NP  Subjective:    Chief Complaint  Patient presents with  . Follow-up  . Medication Refill    carafate    HPI   Peter Marsh is a 60 y.o. male who presents for evaluation of shortness of breath.  He was seen in the emergency room on 12/08/2019.  Dyspnea has been mild, he admits that his symptoms have improved.  He did undergo.  Respiratory work-up which was negative.  He was treated with an inhaler and his symptoms resolved while in the emergency room.  He was discharged.  GERD Paitent complains of heartburn. This has been associated with no other symptoms.  He denies chest pain, choking on food, deep pressure at base of neck, hematemesis, laryngitis, melena and unexpected weight loss. Symptoms have been present for several months. He denies dysphagia. He has not lost weight. He denies melena, hematochezia, hematemesis, and coffee ground emesis. Medical therapy in the past  has included proton pump inhibitors.  He admits that he was on Carafate in the past which was effective   Medications: Outpatient Medications Prior to Visit  Medication Sig  . acetaminophen (TYLENOL) 325 MG tablet Take 650 mg by mouth every 6 (six) hours as needed for mild pain or headache.  . albuterol (PROVENTIL HFA;VENTOLIN HFA) 108 (90 BASE) MCG/ACT inhaler Inhale 2 puffs into the lungs every 6 (six) hours as needed for wheezing or shortness of breath.   . benazepril (LOTENSIN) 20 MG tablet Take 1 tablet by mouth 2 (two) times daily.  . budesonide-formoterol (SYMBICORT) 80-4.5 MCG/ACT inhaler Inhale 2 puffs into the lungs 2 (two) times daily.  [name] buPROPion (WELLBUTRIN SR) 150 MG 12 hr tablet Take 150 mg by mouth 2 (two) times daily.   . cloNIDine (CATAPRES) 0.2 MG tablet Take 1 tablet (0.2 mg total) by mouth 2 (two) times daily.  . Cyanocobalamin (VITAMIN B-12 PO) Take 1 tablet by mouth daily.  . fluticasone (FLONASE) 50 MCG/ACT nasal spray Place 1 spray into both nostrils daily as needed for allergies.   [name] gabapentin (NEURONTIN) 100 MG capsule Take 1 capsule (100 mg total) by mouth 3 (three) times daily.  . hydrALAZINE (APRESOLINE) 50 MG tablet Take 1 tablet (50 mg total) by mouth 3 (three) times daily.  . isosorbide dinitrate (ISORDIL) 30 MG tablet TAKE 1 TABLET(30 MG) BY MOUTH FOUR TIMES DAILY (Patient taking differently: Take 30 mg by mouth 4 (four) times daily. )  . metoprolol tartrate (LOPRESSOR)  100 MG tablet TAKE 1 TABLET BY MOUTH TWICE DAILY (Patient taking differently: Take 100 mg by mouth 2 (two) times daily. )  . montelukast (SINGULAIR) 10 MG tablet Take 10 mg by mouth at bedtime.  . paliperidone (INVEGA) 6 MG 24 hr tablet Take 6 mg by mouth at bedtime.  . tamsulosin (FLOMAX) 0.4 MG CAPS capsule Take 1 capsule (0.4 mg total) by mouth daily.  . benazepril (LOTENSIN) 10 MG tablet Take 1 tablet (10 mg total) by mouth daily. (Patient not taking: Reported on 12/08/2019)  . ondansetron  (ZOFRAN ODT) 4 MG disintegrating tablet Take 1 tablet (4 mg total) by mouth every 8 (eight) hours as needed for nausea or vomiting. (Patient not taking: Reported on 12/08/2019)  . pantoprazole (PROTONIX) 40 MG tablet Take 1 tablet (40 mg total) by mouth daily. (Patient taking differently: Take 40 mg by mouth 2 (two) times daily. )   Facility-Administered Medications Prior to Visit  Medication Dose Route Frequency Provider  . acetaminophen (OFIRMEV) IVPB    PRN Lissa Morales, CRNA  . ePHEDrine injection    PRN Lissa Morales, CRNA  . glycopyrrolate (ROBINUL) injection    PRN Lissa Morales, CRNA  . SUFentanil (SUFENTA) injection    PRN Lissa Morales, CRNA    Review of Systems       Objective:    There were no vitals taken for this visit.          Assessment & Plan:  . Assessment  Primary Diagnosis & Pertinent Problem List: The primary encounter diagnosis was Gastroesophageal reflux disease, unspecified whether esophagitis present. A diagnosis of Shortness of breath was also pertinent to this visit.  Visit Diagnosis: 1. Gastroesophageal reflux disease, unspecified whether esophagitis present  Refill on Carafate until Mr Gomer can follow up with GI  2. Shortness of breath Resolved encourage to continue inhaler use as directed. Follow up if symptoms persist or get worse.        I discussed the assessment and treatment plan with the patient. The patient was provided an opportunity to ask questions and all were answered. The patient agreed with the plan and demonstrated an understanding of the instructions.   The patient was advised to call back or seek an in-person evaluation if the symptoms worsen or if the condition fails to improve as anticipated.  I provided  5 minutes of non-face-to-face time during this encounter.   Vevelyn Francois, NP  [place] [phone] (phone) [phone] (fax)  [place]

## 2019-12-11 ENCOUNTER — Encounter: Payer: Self-pay | Admitting: Nurse Practitioner

## 2019-12-11 ENCOUNTER — Telehealth: Payer: Self-pay | Admitting: Nurse Practitioner

## 2019-12-11 DIAGNOSIS — J45909 Unspecified asthma, uncomplicated: Secondary | ICD-10-CM | POA: Diagnosis not present

## 2019-12-11 DIAGNOSIS — N1832 Chronic kidney disease, stage 3b: Secondary | ICD-10-CM | POA: Diagnosis not present

## 2019-12-11 DIAGNOSIS — N1330 Unspecified hydronephrosis: Secondary | ICD-10-CM | POA: Diagnosis not present

## 2019-12-11 DIAGNOSIS — I129 Hypertensive chronic kidney disease with stage 1 through stage 4 chronic kidney disease, or unspecified chronic kidney disease: Secondary | ICD-10-CM | POA: Diagnosis not present

## 2019-12-11 DIAGNOSIS — Z6832 Body mass index (BMI) 32.0-32.9, adult: Secondary | ICD-10-CM | POA: Diagnosis not present

## 2019-12-11 MED ORDER — SUCRALFATE 1 GM/10ML PO SUSP: 1.0000 g | Freq: Three times a day (TID) | ORAL | 0 refills | Status: DC

## 2019-12-11 NOTE — Telephone Encounter (Signed)
Re-sent this to [facility] on [address]. Thanks!

## 2019-12-29 ENCOUNTER — Ambulatory Visit (INDEPENDENT_AMBULATORY_CARE_PROVIDER_SITE_OTHER): Payer: PPO | Admitting: *Deleted

## 2019-12-29 DIAGNOSIS — I495 Sick sinus syndrome: Secondary | ICD-10-CM | POA: Diagnosis not present

## 2019-12-29 LAB — CUP PACEART REMOTE DEVICE CHECK
Battery Remaining Longevity: 91 mo
Battery Voltage: 3.01 V
Brady Statistic AP VP Percent: 0.12 %
Brady Statistic AP VS Percent: 94.72 %
Brady Statistic AS VP Percent: 0 %
Brady Statistic AS VS Percent: 5.16 %
Brady Statistic RA Percent Paced: 94.85 %
Brady Statistic RV Percent Paced: 0.12 %
Date Time Interrogation Session: 20210822231047
Implantable Lead Implant Date: 20181022
Implantable Lead Implant Date: 20181022
Implantable Lead Location: 753859
Implantable Lead Location: 753860
Implantable Lead Model: 3830
Implantable Lead Model: 5076
Implantable Pulse Generator Implant Date: 20181022
Lead Channel Impedance Value: 304 Ohm
Lead Channel Impedance Value: 323 Ohm
Lead Channel Impedance Value: 437 Ohm
Lead Channel Impedance Value: 456 Ohm
Lead Channel Pacing Threshold Amplitude: 0.625 V
Lead Channel Pacing Threshold Amplitude: 1 V
Lead Channel Pacing Threshold Pulse Width: 0.4 ms
Lead Channel Pacing Threshold Pulse Width: 0.4 ms
Lead Channel Sensing Intrinsic Amplitude: 3.625 mV
Lead Channel Sensing Intrinsic Amplitude: 3.625 mV
Lead Channel Sensing Intrinsic Amplitude: 9.25 mV
Lead Channel Sensing Intrinsic Amplitude: 9.25 mV
Lead Channel Setting Pacing Amplitude: 1.5 V
Lead Channel Setting Pacing Amplitude: 3.5 V
Lead Channel Setting Pacing Pulse Width: 1 ms
Lead Channel Setting Sensing Sensitivity: 1.2 mV

## 2020-01-01 NOTE — Progress Notes (Signed)
Remote pacemaker transmission.   

## 2020-01-09 ENCOUNTER — Telehealth: Payer: Self-pay | Admitting: Cardiovascular Disease

## 2020-01-09 ENCOUNTER — Other Ambulatory Visit: Payer: Self-pay

## 2020-01-09 MED ORDER — HYDRALAZINE HCL 50 MG PO TABS: 50.0000 mg | ORAL_TABLET | Freq: Three times a day (TID) | ORAL | 2 refills | Status: DC

## 2020-01-09 NOTE — Telephone Encounter (Signed)
Spoke with pt, since being off the furosemide he reports swelling in his feet and ankles. That is there in the morning and can get worse by the end of the day. He has SOB walking to the mailbox and to take the trash out. No SOB within the home, no orthopnea. He does not weigh daily but reports he has gained weight. Will forward to dr Audie Box for recommendations on different fluid pill.

## 2020-01-09 NOTE — Telephone Encounter (Signed)
*  STAT* If patient is at the pharmacy, call can be transferred to refill team.   1. Which medications need to be refilled? (please list name of each medication and dose if known) hydrALAZINE (APRESOLINE) 50 MG tablet  2. Which pharmacy/location (including street and city if local pharmacy) is medication to be sent to? [place], [place] - [address] AT [address]  3. Do they need a 30 day or 90 day supply? 90 day supply

## 2020-01-09 NOTE — Telephone Encounter (Signed)
Peter Marsh is stating Dr. Audie Box advised him he is going to start him on a new fluid pill due to the reaction he had to the last one causing him to stop taking it. He states he has not heard anything else about it and has not been taking a fluid pill for awhile due to this. Please advise.

## 2020-01-12 NOTE — Telephone Encounter (Signed)
He can go on torsemide 10 mg daily PRN for LE swelling.   [place] T. Audie Box, [place]  [address] [place], [place] [address] [phone]  5:04 PM

## 2020-01-13 ENCOUNTER — Other Ambulatory Visit: Payer: Self-pay | Admitting: Cardiovascular Disease

## 2020-01-13 DIAGNOSIS — F250 Schizoaffective disorder, bipolar type: Secondary | ICD-10-CM | POA: Diagnosis not present

## 2020-01-13 DIAGNOSIS — N3010 Interstitial cystitis (chronic) without hematuria: Secondary | ICD-10-CM | POA: Diagnosis not present

## 2020-01-13 MED ORDER — TORSEMIDE 10 MG PO TABS: 10.0000 mg | ORAL_TABLET | ORAL | 1 refills | Status: DC | PRN

## 2020-01-13 NOTE — Telephone Encounter (Signed)
Sent in RX to pharmacy  ° °

## 2020-01-13 NOTE — Telephone Encounter (Signed)
Called spoke to wife- advised of med sent to pharmacy.  She verbalized and would tell patient to pick up medication to take.

## 2020-01-16 ENCOUNTER — Ambulatory Visit: Payer: PPO | Admitting: Gastroenterology

## 2020-01-19 ENCOUNTER — Telehealth (INDEPENDENT_AMBULATORY_CARE_PROVIDER_SITE_OTHER): Payer: PPO | Admitting: Nurse Practitioner

## 2020-01-19 ENCOUNTER — Other Ambulatory Visit: Payer: Self-pay

## 2020-01-19 DIAGNOSIS — R112 Nausea with vomiting, unspecified: Secondary | ICD-10-CM | POA: Diagnosis not present

## 2020-01-19 MED ORDER — ONDANSETRON 4 MG PO TBDP: 4.0000 mg | ORAL_TABLET | Freq: Three times a day (TID) | ORAL | 0 refills | Status: DC | PRN

## 2020-01-19 NOTE — Progress Notes (Signed)
   [facility] [place], [place]  [address] Phone:  [phone]   Fax:  [phone] Virtual Visit via Telephone Note  I connected with Peter Marsh on 01/19/20 at  1:40 PM EDT by telephone and verified that I am speaking with the correct person using two identifiers.   I discussed the limitations, risks, security and privacy concerns of performing an evaluation and management service by telephone and the availability of in person appointments. I also discussed with the patient that there may be a patient responsible charge related to this service. The patient expressed understanding and agreed to proceed.  Patient home Provider Office  History of Present Illness:  Nausea / Vomiting Patient complains of nausea and vomiting. Onset of symptoms was several days ago. Patient describes nausea as moderate. Vomiting has occurred 1 times over the past 3 days. Vomitus is described as normal gastric contents. Symptoms have been associated with fever to 101.  He denies a fever today. Patient denies alcohol overuse, hematemesis, melena and possibility of pregnancy. Symptoms have stabilized. Evaluation to date has been none. Treatment to date has been none.     Observations/Objective: No exam  Pt able to complete visit without difficulty.   Assessment and Plan: Assessment  Primary Diagnosis & Pertinent Problem List: The encounter diagnosis was Nausea and vomiting, intractability of vomiting not specified, unspecified vomiting type.  Visit Diagnosis: 1. Nausea and vomiting, intractability of vomiting not specified, unspecified vomiting type   Trial Ondansetron    Follow Up Instructions: Apt as scheduled    I discussed the assessment and treatment plan with the patient. The patient was provided an opportunity to ask questions and all were answered. The patient agreed with the plan and demonstrated an understanding of the instructions.   The patient was  advised to call back or seek an in-person evaluation if the symptoms worsen or if the condition fails to improve as anticipated.  I provided 8 minutes of non-face-to-face time during this encounter.   Vevelyn Francois, NP

## 2020-01-25 ENCOUNTER — Encounter: Payer: Self-pay | Admitting: Nurse Practitioner

## 2020-02-10 ENCOUNTER — Other Ambulatory Visit: Payer: Self-pay | Admitting: Cardiovascular Disease

## 2020-02-11 ENCOUNTER — Other Ambulatory Visit: Payer: Self-pay

## 2020-02-11 ENCOUNTER — Ambulatory Visit (INDEPENDENT_AMBULATORY_CARE_PROVIDER_SITE_OTHER): Payer: PPO | Admitting: Nurse Practitioner

## 2020-02-11 ENCOUNTER — Encounter: Payer: Self-pay | Admitting: Nurse Practitioner

## 2020-02-11 VITALS — BP 182/81 | HR 78 | Temp 97.1°F | Ht 72.0 in | Wt 241.0 lb

## 2020-02-11 DIAGNOSIS — K219 Gastro-esophageal reflux disease without esophagitis: Secondary | ICD-10-CM

## 2020-02-11 DIAGNOSIS — Z13220 Encounter for screening for lipoid disorders: Secondary | ICD-10-CM | POA: Diagnosis not present

## 2020-02-11 DIAGNOSIS — I10 Essential (primary) hypertension: Secondary | ICD-10-CM

## 2020-02-11 DIAGNOSIS — J45909 Unspecified asthma, uncomplicated: Secondary | ICD-10-CM

## 2020-02-11 DIAGNOSIS — Z23 Encounter for immunization: Secondary | ICD-10-CM | POA: Diagnosis not present

## 2020-02-11 DIAGNOSIS — N183 Chronic kidney disease, stage 3 unspecified: Secondary | ICD-10-CM

## 2020-02-11 LAB — POCT URINALYSIS DIPSTICK
Bilirubin, UA: NEGATIVE
Blood, UA: NEGATIVE
Glucose, UA: NEGATIVE
Ketones, UA: NEGATIVE
Leukocytes, UA: NEGATIVE
Nitrite, UA: NEGATIVE
Protein, UA: POSITIVE — AB
Spec Grav, UA: 1.02 (ref 1.010–1.025)
Urobilinogen, UA: 0.2 E.U./dL
pH, UA: 5.5 (ref 5.0–8.0)

## 2020-02-11 MED ORDER — PANTOPRAZOLE SODIUM 40 MG PO TBEC: 40.0000 mg | DELAYED_RELEASE_TABLET | Freq: Every day | ORAL | 3 refills | Status: DC

## 2020-02-11 MED ORDER — FLUTICASONE PROPIONATE 50 MCG/ACT NA SUSP: 1.0000 | Freq: Every day | NASAL | 11 refills | Status: DC | PRN

## 2020-02-11 MED ORDER — ALBUTEROL SULFATE HFA 108 (90 BASE) MCG/ACT IN AERS: 2.0000 | INHALATION_SPRAY | Freq: Four times a day (QID) | RESPIRATORY_TRACT | 11 refills | Status: DC | PRN

## 2020-02-11 MED ORDER — MONTELUKAST SODIUM 10 MG PO TABS: 10.0000 mg | ORAL_TABLET | Freq: Every day | ORAL | 3 refills | Status: DC

## 2020-02-11 MED ORDER — BUDESONIDE-FORMOTEROL FUMARATE 80-4.5 MCG/ACT IN AERO: 2.0000 | INHALATION_SPRAY | Freq: Two times a day (BID) | RESPIRATORY_TRACT | 11 refills | Status: DC

## 2020-02-11 MED ORDER — PANTOPRAZOLE SODIUM 40 MG PO TBEC
40.0000 mg | DELAYED_RELEASE_TABLET | Freq: Two times a day (BID) | ORAL | 3 refills | Status: DC
Start: 2020-02-11 — End: 2020-02-12

## 2020-02-11 NOTE — Patient Instructions (Signed)

## 2020-02-11 NOTE — Progress Notes (Signed)
[facility] [place], [place]  [address] Phone:  [phone]   Fax:  [phone]   Established Patient Office Visit  Subjective:  Patient ID: Peter Marsh [place], male    DOB: 04/17/1961  Age: 59 y.o. MRN: [id number]  CC:  Chief Complaint  Patient presents with  . Follow-up    no problems, need med refills    HPI [name] presents for follow up. He  has a past medical history of Asthma, Bilateral carotid bruits, Cardiac conduction disorder (2018), Chronic kidney disease, Chronic kidney disease (CKD), stage IV (severe) ([place]), Depression, Encounter for care of pacemaker (02/13/2019), GERD (gastroesophageal reflux disease), History of stomach ulcers (2001), Hypertension, LBBB (left bundle branch block), Lower extremity edema, Mild intermittent asthma without complication, Mixed hyperlipidemia, Mobitz type 2 second degree AV block (04/06/2019), Neuropathy, Pacemaker: Medtronic Azure XT DR MRI [id number] PPM -  BUNDLE OF HIS pacing  (02/26/2017), Paroxysmal atrial flutter ([place]), PONV (postoperative nausea and vomiting), Prostatitis, Schizophrenia ([place]), and Sinus node dysfunction ([place]) (02/13/2019).   He is followed by nephrology, Dr Hollie Salk and cardiology, Dr Marisue Ivan .  GERD Paitent complains of heartburn. This has been associated with no other symptoms.  He denies abdominal bloating, belching and eructation, chest pain, cough, dysphagia, heartburn, nausea and wheezing. He denies dysphagia. He has not lost weight. He denies melena, hematochezia, hematemesis, and coffee ground emesis. Medical therapy in the past has included proton pump inhibitors.   Past Medical History:  Diagnosis Date  . Asthma    uses inhalers   . Bilateral carotid bruits   . Cardiac conduction disorder 2018   s/p MDT PPM  . Chronic kidney disease    bladder interstial cystitis   . Chronic kidney disease (CKD), stage IV (severe) ([name])    followed by Dr. Joelyn Oms at [facility]  .  Depression   . Encounter for care of pacemaker 02/13/2019  . GERD (gastroesophageal reflux disease)   . History of stomach ulcers 2001  . Hypertension   . LBBB (left bundle branch block)   . Lower extremity edema   . Mild intermittent asthma without complication   . Mixed hyperlipidemia   . Mobitz type 2 second degree AV block 04/06/2019  . Neuropathy   . Pacemaker: Medtronic Azure XT DR MRI [id number] PPM -  BUNDLE OF HIS pacing  02/26/2017   Scheduled Remote pacemaker check  11/12/2018:  There were 24 Fast AV episodes:  EGMs show SVTs. Episodes lasted < 2 minutes. Health trends do not demonstrate significant abnormality. Battery longevity is 9.4 - 10.3 years. RA pacing is 47.1 %, RV pacing is 40.4 %.  Clinic check 11/07/17.   . Paroxysmal atrial flutter ([name])   . PONV (postoperative nausea and vomiting)   . Prostatitis   . Schizophrenia ([name])   . Sinus node dysfunction ([name]) 02/13/2019    Past Surgical History:  Procedure Laterality Date  . APPENDECTOMY    . ELECTROPHYSIOLOGY STUDY N/A 02/26/2017   Procedure: ELECTROPHYSIOLOGY STUDY;  Surgeon: Evans Lance, MD;  Location: [facility];  Service: Cardiovascular;  Laterality: N/A;  . ESOPHAGOGASTRODUODENOSCOPY (EGD) WITH PROPOFOL N/A 09/18/2016   Procedure: ESOPHAGOGASTRODUODENOSCOPY (EGD) WITH PROPOFOL;  Surgeon: Ronnette Juniper, MD;  Location: [facility];  Service: Gastroenterology;  Laterality: N/A;  . LUMBAR LAMINECTOMY/DECOMPRESSION MICRODISCECTOMY  04/19/2011   Procedure: LUMBAR LAMINECTOMY/DECOMPRESSION MICRODISCECTOMY;  Surgeon: Tobi Bastos;  Location: [facility];  Service: Orthopedics;  Laterality: Left;  Hemi LAminectomy/Microdiscectomy Lumbar four  -  Lumbar five  on the Left (X-Ray)  . PACEMAKER IMPLANT N/A 02/26/2017   Procedure: PACEMAKER IMPLANT;  Surgeon: Evans Lance, MD;  Location: [facility];  Service: Cardiovascular;  Laterality: N/A;  . TONSILLECTOMY      Family History  Problem Relation Age of Onset    . High blood pressure Mother   . Alzheimer's disease Father     Social History   Socioeconomic History  . Marital status: Married    Spouse name: Not on file  . Number of children: 1  . Years of education: 75  . Highest education level: High school graduate  Occupational History  . Occupation: Retired  Tobacco Use  . Smoking status: Former Smoker    Packs/day: 1.00    Years: 30.00    Pack years: 30.00    Quit date: 05/08/2008    Years since quitting: 11.7  . Smokeless tobacco: Never Used  Vaping Use  . Vaping Use: Never used  Substance and Sexual Activity  . Alcohol use: No  . Drug use: No  . Sexual activity: Never  Other Topics Concern  . Not on file  Social History Narrative   Lives at home with wife.   Right-handed.   One cup caffeine per day.   Social Determinants of Health   Financial Resource Strain:   . Difficulty of Paying Living Expenses: Not on file  Food Insecurity:   . Worried About Charity fundraiser in the Last Year: Not on file  . Ran Out of Food in the Last Year: Not on file  Transportation Needs:   . Lack of Transportation (Medical): Not on file  . Lack of Transportation (Non-Medical): Not on file  Physical Activity:   . Days of Exercise per Week: Not on file  . Minutes of Exercise per Session: Not on file  Stress:   . Feeling of Stress : Not on file  Social Connections:   . Frequency of Communication with Friends and Family: Not on file  . Frequency of Social Gatherings with Friends and Family: Not on file  . Attends Religious Services: Not on file  . Active Member of Clubs or Organizations: Not on file  . Attends Archivist Meetings: Not on file  . Marital Status: Not on file  Intimate Partner Violence:   . Fear of Current or Ex-Partner: Not on file  . Emotionally Abused: Not on file  . Physically Abused: Not on file  . Sexually Abused: Not on file    Outpatient Medications Prior to Visit  Medication Sig Dispense Refill   . acetaminophen (TYLENOL) 325 MG tablet Take 650 mg by mouth every 6 (six) hours as needed for mild pain or headache.    [name] buPROPion (WELLBUTRIN SR) 150 MG 12 hr tablet Take 150 mg by mouth 2 (two) times daily.     . Cyanocobalamin (VITAMIN B-12 PO) Take 1 tablet by mouth daily.    [name] gabapentin (NEURONTIN) 100 MG capsule Take 1 capsule (100 mg total) by mouth 3 (three) times daily. 90 capsule 11  . hydrALAZINE (APRESOLINE) 50 MG tablet Take 1 tablet (50 mg total) by mouth 3 (three) times daily. 270 tablet 2  . isosorbide dinitrate (ISORDIL) 30 MG tablet TAKE 1 TABLET(30 MG) BY MOUTH FOUR TIMES DAILY (Patient taking differently: Take 30 mg by mouth 4 (four) times daily. ) 368 tablet 3  . metoprolol tartrate (LOPRESSOR) 100 MG tablet TAKE 1 TABLET BY MOUTH TWICE DAILY (Patient  taking differently: Take 100 mg by mouth 2 (two) times daily. ) 180 tablet 0  . ondansetron (ZOFRAN ODT) 4 MG disintegrating tablet Take 1 tablet (4 mg total) by mouth every 8 (eight) hours as needed for nausea or vomiting. 20 tablet 0  . paliperidone (INVEGA) 6 MG 24 hr tablet Take 6 mg by mouth at bedtime.    . sucralfate (CARAFATE) 1 GM/10ML suspension Take 10 mLs (1 g total) by mouth 4 (four) times daily -  with meals and at bedtime. 420 mL 0  . tamsulosin (FLOMAX) 0.4 MG CAPS capsule Take 1 capsule (0.4 mg total) by mouth daily. 30 capsule 6  . torsemide (DEMADEX) 10 MG tablet TAKE 1 TABLET BY MOUTH AS NEEDED FOR LEG SWELLING 90 tablet 0  . albuterol (PROVENTIL HFA;VENTOLIN HFA) 108 (90 BASE) MCG/ACT inhaler Inhale 2 puffs into the lungs every 6 (six) hours as needed for wheezing or shortness of breath.     . budesonide-formoterol (SYMBICORT) 80-4.5 MCG/ACT inhaler Inhale 2 puffs into the lungs 2 (two) times daily. 1 Inhaler 11  . fluticasone (FLONASE) 50 MCG/ACT nasal spray Place 1 spray into both nostrils daily as needed for allergies.     . montelukast (SINGULAIR) 10 MG tablet Take 10 mg by mouth at bedtime.    .  benazepril (LOTENSIN) 10 MG tablet Take 1 tablet (10 mg total) by mouth daily. (Patient not taking: Reported on 02/11/2020) 90 tablet 1  . benazepril (LOTENSIN) 20 MG tablet Take 1 tablet by mouth 2 (two) times daily. (Patient not taking: Reported on 02/11/2020)    . cloNIDine (CATAPRES) 0.2 MG tablet Take 1 tablet (0.2 mg total) by mouth 2 (two) times daily. 60 tablet 2  . pantoprazole (PROTONIX) 40 MG tablet Take 1 tablet (40 mg total) by mouth daily. (Patient taking differently: Take 40 mg by mouth 2 (two) times daily. ) 30 tablet 0   Facility-Administered Medications Prior to Visit  Medication Dose Route Frequency Provider Last Rate Last Admin  . acetaminophen (OFIRMEV) IVPB    PRN Lissa Morales, CRNA   1,000 mg at 04/19/11 0910  . ePHEDrine injection    PRN Lissa Morales, CRNA   5 mg at 04/19/11 1018  . glycopyrrolate (ROBINUL) injection    PRN Lissa Morales, CRNA   0.8 mg at 04/19/11 1037  . SUFentanil (SUFENTA) injection    PRN Lissa Morales, CRNA   10 mcg at 04/19/11 1008    Allergies  Allergen Reactions  . Methylpyrrolidone Hives    froze the intestine  . Niacin Other (See Comments) and Nausea And Vomiting    Flushing, itching, tingling   . Norvasc [Amlodipine Besylate] Other (See Comments)    Swollen Feet  . Oxybutynin Chloride [Oxybutynin Chloride Er] Other (See Comments)    froze the intestine  . Vesicare [Solifenacin Succinate] Other (See Comments)    Froze the intestine   . Ciprofloxacin Rash and Other (See Comments)    Felt flushed   . Oxybutynin Rash  . Solifenacin Rash    ROS Review of Systems  Constitutional: Negative for activity change, appetite change, chills, fever (rseolved last week had an asthma attack on Mondary) and unexpected weight change.  Eyes: Negative for visual disturbance (glasses).       Glasses  Respiratory: Negative for cough and shortness of breath.   Cardiovascular: Negative for chest pain and palpitations.  Gastrointestinal: Negative.    Endocrine: Negative.   Genitourinary: Negative.   Musculoskeletal: Negative.  Skin: Negative.   Allergic/Immunologic: Negative.   Hematological: Negative.       Objective:    Physical Exam Constitutional:      General: He is not in acute distress.    Appearance: He is obese. He is not ill-appearing, toxic-appearing or diaphoretic.  HENT:     Head: Normocephalic and atraumatic.     Nose: Nose normal.     Mouth/Throat:     Mouth: Mucous membranes are moist.  Cardiovascular:     Rate and Rhythm: Normal rate and regular rhythm.     Pulses: Normal pulses.     Heart sounds: Normal heart sounds.  Pulmonary:     Effort: Pulmonary effort is normal.     Breath sounds: Normal breath sounds.  Abdominal:     Comments: Creased abdominal girth with visible abdominal wall weakness  Musculoskeletal:        General: Normal range of motion.     Cervical back: Normal range of motion.  Skin:    General: Skin is warm and dry.     Capillary Refill: Capillary refill takes less than 2 seconds.  Neurological:     General: No focal deficit present.     Mental Status: He is alert and oriented to person, place, and time.  Psychiatric:        Mood and Affect: Mood normal.        Behavior: Behavior normal.        Thought Content: Thought content normal.        Judgment: Judgment normal.     BP (!) 182/81   Pulse 78   Temp (!) 97.1 F (36.2 C) (Temporal)   Ht 6' (1.829 m)   Wt 241 lb (109.3 kg)   SpO2 96%   BMI 32.69 kg/m  Wt Readings from Last 3 Encounters:  02/11/20 241 lb (109.3 kg)  12/08/19 (!) 250 lb (113.4 kg)  10/13/19 253 lb 12.8 oz (115.1 kg)     Health Maintenance Due  Topic Date Due  . Hepatitis C Screening  Never done  . COLONOSCOPY  Never done    There are no preventive care reminders to display for this patient.  Lab Results  Component Value Date   TSH 1.140 09/08/2019   Lab Results  Component Value Date   WBC 7.1 12/08/2019   HGB 14.0 12/08/2019   HCT  44.5 12/08/2019   MCV 88.8 12/08/2019   PLT 205 12/08/2019   Lab Results  Component Value Date   NA 137 12/08/2019   K 3.8 12/08/2019   CO2 24 12/08/2019   GLUCOSE 107 (H) 12/08/2019   BUN 14 12/08/2019   CREATININE 2.04 (H) 12/08/2019   BILITOT 0.2 10/13/2019   ALKPHOS 87 10/13/2019   AST 13 10/13/2019   ALT 11 10/13/2019   PROT 6.3 10/13/2019   ALBUMIN 4.0 10/13/2019   CALCIUM 9.3 12/08/2019   ANIONGAP 9 12/08/2019   Lab Results  Component Value Date   CHOL 154 02/11/2020   Lab Results  Component Value Date   HDL 25 (L) 02/11/2020   Lab Results  Component Value Date   LDLCALC 98 02/11/2020   Lab Results  Component Value Date   TRIG 176 (H) 02/11/2020   Lab Results  Component Value Date   CHOLHDL 6.2 (H) 02/11/2020   Lab Results  Component Value Date   HGBA1C 5.5 09/08/2019      Assessment & Plan:   Problem List Items Addressed This Visit  Digestive   GERD (gastroesophageal reflux disease) Continue to monitor diet for those things that can cause increase reflux Continue with current medication regimen pantoprazole   Relevant Medications   pantoprazole (PROTONIX) 40 MG tablet     Genitourinary   CKD (chronic kidney disease), stage III ([name])  Continue to follow-up with nephrology   Relevant Orders   POCT urinalysis dipstick (Completed)    Other Visit Diagnoses    Hypertension, unspecified type     Continue with current regimen and follow-up with cardiology as scheduled   Screening for hypercholesterolemia       Relevant Orders   Lipid panel (Completed)      Meds ordered this encounter  Medications  . albuterol (VENTOLIN HFA) 108 (90 Base) MCG/ACT inhaler    Sig: Inhale 2 puffs into the lungs every 6 (six) hours as needed for wheezing or shortness of breath.    Dispense:  8 g    Refill:  11  . budesonide-formoterol (SYMBICORT) 80-4.5 MCG/ACT inhaler    Sig: Inhale 2 puffs into the lungs 2 (two) times daily.    Dispense:  10.2 g     Refill:  11    Order Specific Question:   Lot Number?    Answer:   [id number]    Order Specific Question:   Expiration Date?    Answer:   02/05/2017    Order Specific Question:   Manufacturer?    Answer:   [employer] [[id number]]    Order Specific Question:   Quantity    Answer:   1  . DISCONTD: pantoprazole (PROTONIX) 40 MG tablet    Sig: Take 1 tablet (40 mg total) by mouth daily.    Dispense:  90 tablet    Refill:  3  . montelukast (SINGULAIR) 10 MG tablet    Sig: Take 1 tablet (10 mg total) by mouth at bedtime.    Dispense:  90 tablet    Refill:  3  . fluticasone (FLONASE) 50 MCG/ACT nasal spray    Sig: Place 1 spray into both nostrils daily as needed for allergies.    Dispense:  11.1 mL    Refill:  11  . pantoprazole (PROTONIX) 40 MG tablet    Sig: Take 1 tablet (40 mg total) by mouth 2 (two) times daily.    Dispense:  180 tablet    Refill:  3    Order Specific Question:   Supervising Provider    Answer:   Tresa Garter [id number]    Follow-up: Return in about 3 months (around 05/13/2020).    Vevelyn Francois, NP

## 2020-02-12 ENCOUNTER — Other Ambulatory Visit: Payer: Self-pay

## 2020-02-12 ENCOUNTER — Telehealth: Payer: Self-pay

## 2020-02-12 ENCOUNTER — Telehealth: Payer: Self-pay | Admitting: Internal Medicine

## 2020-02-12 ENCOUNTER — Other Ambulatory Visit: Payer: Self-pay | Admitting: Nurse Practitioner

## 2020-02-12 ENCOUNTER — Telehealth: Payer: Self-pay | Admitting: Cardiovascular Disease

## 2020-02-12 DIAGNOSIS — I10 Essential (primary) hypertension: Secondary | ICD-10-CM

## 2020-02-12 LAB — LIPID PANEL
Chol/HDL Ratio: 6.2 ratio — ABNORMAL HIGH (ref 0.0–5.0)
Cholesterol, Total: 154 mg/dL (ref 100–199)
HDL: 25 mg/dL — ABNORMAL LOW (ref >39)
LDL Chol Calc (NIH): 98 mg/dL (ref 0–99)
Triglycerides: 176 mg/dL — ABNORMAL HIGH (ref 0–149)
VLDL Cholesterol Cal: 31 mg/dL (ref 5–40)

## 2020-02-12 MED ORDER — TAMSULOSIN HCL 0.4 MG PO CAPS: 0.4000 mg | ORAL_CAPSULE | Freq: Every day | ORAL | 2 refills | Status: DC

## 2020-02-12 MED ORDER — BUDESONIDE-FORMOTEROL FUMARATE 160-4.5 MCG/ACT IN AERO
2.0000 | INHALATION_SPRAY | Freq: Two times a day (BID) | RESPIRATORY_TRACT | 12 refills | Status: DC
Start: 2020-02-12 — End: 2020-02-12

## 2020-02-12 MED ORDER — HYDRALAZINE HCL 50 MG PO TABS: 50.0000 mg | ORAL_TABLET | Freq: Three times a day (TID) | ORAL | 2 refills | Status: DC

## 2020-02-12 MED ORDER — METOPROLOL TARTRATE 100 MG PO TABS: 100.0000 mg | ORAL_TABLET | Freq: Two times a day (BID) | ORAL | 2 refills | Status: DC

## 2020-02-12 MED ORDER — ISOSORBIDE DINITRATE 30 MG PO TABS: ORAL_TABLET | ORAL | 2 refills | Status: DC

## 2020-02-12 MED ORDER — TORSEMIDE 10 MG PO TABS: 10.0000 mg | ORAL_TABLET | Freq: Every day | ORAL | 2 refills | Status: DC

## 2020-02-12 MED ORDER — BUDESONIDE-FORMOTEROL FUMARATE 160-4.5 MCG/ACT IN AERO: 2.0000 | INHALATION_SPRAY | Freq: Two times a day (BID) | RESPIRATORY_TRACT | 2 refills | Status: DC

## 2020-02-12 MED ORDER — PANTOPRAZOLE SODIUM 40 MG PO TBEC: 40.0000 mg | DELAYED_RELEASE_TABLET | Freq: Two times a day (BID) | ORAL | 2 refills | Status: DC

## 2020-02-12 NOTE — Telephone Encounter (Signed)
Please resubmit the Symbicort inhaler to [facility]. They never received the prescription

## 2020-02-12 NOTE — Telephone Encounter (Signed)
Let patient know that Dr. Marisue Ivan said that he would refill Symbicort one last time but going forward patient's PCP or pulmonologist would have to refill. I did refill all cardiac medications as well.

## 2020-02-12 NOTE — Telephone Encounter (Signed)
*  STAT* If patient is at the pharmacy, call can be transferred to refill team.   1. Which medications need to be refilled? (please list name of each medication and dose if known)  hydrALAZINE (APRESOLINE) 50 MG tablet isosorbide dinitrate (ISORDIL) 30 MG tablet metoprolol tartrate (LOPRESSOR) 100 MG tablet  2. Which pharmacy/location (including street and city if local pharmacy) is medication to be sent to? [place], [place] - [address] AT [place]  3. Do they need a 30 day or 90 day supply? 30 with refills

## 2020-02-12 NOTE — Telephone Encounter (Signed)
Please refill it.  30 days is fine.  Please inform him that his primary care physician or pulmonologist who prescribed this moving forward.  We are happy to help him out this 1 time.  [place] T. Audie Box, [place]  [address] [place], [place] [address] [phone]  3:55 PM

## 2020-02-12 NOTE — Telephone Encounter (Signed)
New message     *STAT* If patient is at the pharmacy, call can be transferred to refill team.   1. Which medications need to be refilled? (please list name of each medication and dose if known)  Budesonide-formoterol inhaler  2. Which pharmacy/location (including street and city if local pharmacy) is medication to be sent to? [facility] at [place]. Do they need a 30 day or 90 day supply?  Pt said his PCP could not call in medication and that Dr Audie Box called it in the last time.  He is out of medication

## 2020-02-17 ENCOUNTER — Other Ambulatory Visit: Payer: Self-pay | Admitting: Nurse Practitioner

## 2020-02-17 MED ORDER — FENOFIBRATE 50 MG PO CAPS: 1.0000 | ORAL_CAPSULE | Freq: Every day | ORAL | 11 refills | Status: DC

## 2020-03-02 ENCOUNTER — Telehealth: Payer: Self-pay | Admitting: Cardiovascular Disease

## 2020-03-02 NOTE — Telephone Encounter (Signed)
That is fine. -W

## 2020-03-02 NOTE — Telephone Encounter (Signed)
Pt c/o medication issue:  1. Name of Medication: torsemide (DEMADEX) 10 MG tablet  2. How are you currently taking this medication (dosage and times per day)? Stopped taking  3. Are you having a reaction (difficulty breathing--STAT)? no  4. What is your medication issue? Patient states the medication hurts his kidney's. He states one day he could hardly get out of bed. He states he stopped taking it it yesterday and does not have that problem anymore.

## 2020-03-02 NOTE — Telephone Encounter (Signed)
Patient states he was taking Torsemide for a few days but has since stopped taking the medication all together due to it causing bladder and kidney pain. Patient states he has kidney disease and has had trouble tolerating Furosemide as well in the past. He states that he has felt much better since stopping the Torsemide and just wanted to make Dr. Audie Box aware. He denies any swelling or SOB at this time. Advised patient to call back if any new symptoms present.

## 2020-03-16 ENCOUNTER — Emergency Department (HOSPITAL_COMMUNITY)
Admission: EM | Admit: 2020-03-16 | Discharge: 2020-03-16 | Disposition: A | Discharge status: Home / Self Care | Payer: PPO | Attending: Emergency Medicine | Admitting: Emergency Medicine

## 2020-03-16 ENCOUNTER — Encounter (HOSPITAL_COMMUNITY): Payer: Self-pay | Admitting: Emergency Medicine

## 2020-03-16 DIAGNOSIS — Z5321 Procedure and treatment not carried out due to patient leaving prior to being seen by health care provider: Secondary | ICD-10-CM | POA: Insufficient documentation

## 2020-03-16 DIAGNOSIS — R112 Nausea with vomiting, unspecified: Secondary | ICD-10-CM | POA: Insufficient documentation

## 2020-03-16 DIAGNOSIS — R509 Fever, unspecified: Secondary | ICD-10-CM | POA: Insufficient documentation

## 2020-03-16 LAB — URINALYSIS, ROUTINE W REFLEX MICROSCOPIC
Bilirubin Urine: NEGATIVE
Glucose, UA: NEGATIVE mg/dL
Hgb urine dipstick: NEGATIVE
Ketones, ur: NEGATIVE mg/dL
Leukocytes,Ua: NEGATIVE
Nitrite: NEGATIVE
Protein, ur: 100 mg/dL — AB
Specific Gravity, Urine: 1.018 (ref 1.005–1.030)
pH: 6 (ref 5.0–8.0)

## 2020-03-16 LAB — COMPREHENSIVE METABOLIC PANEL
ALT: 11 U/L (ref 0–44)
AST: 17 U/L (ref 15–41)
Albumin: 4 g/dL (ref 3.5–5.0)
Alkaline Phosphatase: 50 U/L (ref 38–126)
Anion gap: 12 (ref 5–15)
BUN: 19 mg/dL (ref 8–23)
CO2: 22 mmol/L (ref 22–32)
Calcium: 9.5 mg/dL (ref 8.9–10.3)
Chloride: 102 mmol/L (ref 98–111)
Creatinine, Ser: 2.33 mg/dL — ABNORMAL HIGH (ref 0.61–1.24)
GFR, Estimated: 31 mL/min — ABNORMAL LOW (ref >60)
Glucose, Bld: 119 mg/dL — ABNORMAL HIGH (ref 70–99)
Potassium: 4.4 mmol/L (ref 3.5–5.1)
Sodium: 136 mmol/L (ref 135–145)
Total Bilirubin: 0.7 mg/dL (ref 0.3–1.2)
Total Protein: 7.2 g/dL (ref 6.5–8.1)

## 2020-03-16 LAB — CBC
HCT: 45.1 % (ref 39.0–52.0)
Hemoglobin: 14.1 g/dL (ref 13.0–17.0)
MCH: 28.3 pg (ref 26.0–34.0)
MCHC: 31.3 g/dL (ref 30.0–36.0)
MCV: 90.6 fL (ref 80.0–100.0)
Platelets: 259 10*3/uL (ref 150–400)
RBC: 4.98 MIL/uL (ref 4.22–5.81)
RDW: 12.7 % (ref 11.5–15.5)
WBC: 8.7 10*3/uL (ref 4.0–10.5)
nRBC: 0 % (ref 0.0–0.2)

## 2020-03-16 LAB — LIPASE, BLOOD: Lipase: 27 U/L (ref 11–51)

## 2020-03-16 NOTE — ED Notes (Signed)
Pt checked out AMA.

## 2020-03-16 NOTE — ED Triage Notes (Signed)
Pt reports vomiting and fever as high as 103 for the past 2-3 weeks. Denies diarrhea or recent sick contacts.

## 2020-03-17 ENCOUNTER — Telehealth: Payer: Self-pay

## 2020-03-17 NOTE — Telephone Encounter (Signed)
After review of pt chart for upcoming appt with Dr. Tarri Glenn on 03/23/20, called pt to inquire further about colonoscopy/EGD reports. States he does not have these records and was not able to obtain from Dr. Ulyses Amor office d/t financial reasons. Advised we will address on 03/23/20 during his appt. Verbalized acceptance and understanding.

## 2020-03-22 ENCOUNTER — Ambulatory Visit (INDEPENDENT_AMBULATORY_CARE_PROVIDER_SITE_OTHER): Payer: PPO | Admitting: Nurse Practitioner

## 2020-03-22 ENCOUNTER — Encounter: Payer: Self-pay | Admitting: Nurse Practitioner

## 2020-03-22 ENCOUNTER — Other Ambulatory Visit: Payer: Self-pay

## 2020-03-22 VITALS — BP 154/77 | HR 77 | Temp 97.4°F | Resp 16 | Ht 73.0 in | Wt 236.6 lb

## 2020-03-22 DIAGNOSIS — I10 Essential (primary) hypertension: Secondary | ICD-10-CM

## 2020-03-22 DIAGNOSIS — N183 Chronic kidney disease, stage 3 unspecified: Secondary | ICD-10-CM | POA: Diagnosis not present

## 2020-03-22 DIAGNOSIS — K219 Gastro-esophageal reflux disease without esophagitis: Secondary | ICD-10-CM

## 2020-03-22 DIAGNOSIS — J45909 Unspecified asthma, uncomplicated: Secondary | ICD-10-CM | POA: Diagnosis not present

## 2020-03-22 LAB — POCT URINALYSIS DIP (CLINITEK)
Bilirubin, UA: NEGATIVE
Blood, UA: NEGATIVE
Glucose, UA: NEGATIVE mg/dL
Ketones, POC UA: NEGATIVE mg/dL
Leukocytes, UA: NEGATIVE
Nitrite, UA: NEGATIVE
POC PROTEIN,UA: 100 — AB
Spec Grav, UA: 1.025 (ref 1.010–1.025)
Urobilinogen, UA: 0.2 E.U./dL
pH, UA: 5.5 (ref 5.0–8.0)

## 2020-03-22 NOTE — Progress Notes (Signed)
Peter Marsh, Peter Marsh  [id number] Phone:  [phone]   Fax:  [phone]   Established Patient Office Visit  Subjective:  Patient ID: Peter Marsh, male    DOB: 10/09/1960  Age: 59 y.o. MRN: [id number]  CC:  Chief Complaint  Patient presents with  . Follow-up    HPI Peter Marsh presents for follow up. He  has a past medical history of Asthma, Bilateral carotid bruits, Cardiac conduction disorder (2018), Chronic kidney disease, Chronic kidney disease (CKD), stage IV (severe) ([name]), Depression, Encounter for care of pacemaker (02/13/2019), GERD (gastroesophageal reflux disease), History of stomach ulcers (2001), Hypertension, LBBB (left bundle branch block), Lower extremity edema, Mild intermittent asthma without complication, Mixed hyperlipidemia, Mobitz type 2 second degree AV block (04/06/2019), Neuropathy, Pacemaker: Medtronic Azure XT DR MRI [id number] PPM -  BUNDLE OF HIS pacing  (02/26/2017), Paroxysmal atrial flutter ([name]), PONV (postoperative nausea and vomiting), Prostatitis, Schizophrenia ([name]), and Sinus node dysfunction ([name]) (02/13/2019).    He is in today for general follow-up.  He has recently seen cardiology for his cardiac follow-up.  He continues to see his psychiatrist for schizophrenia.  He admits that he will be following up with gastroenterology for his abdominal pain.  He denies any new concerns today. Denies headache, dizziness, visual changes, shortness of breath, dyspnea on exertion, chest pain, nausea, vomiting or any edema.   Past Medical History:  Diagnosis Date  . Asthma    uses inhalers   . Bilateral carotid bruits   . Cardiac conduction disorder 2018   s/p MDT PPM  . Chronic kidney disease    bladder interstial cystitis   . Chronic kidney disease (CKD), stage IV (severe) ([name])    followed by Dr[facility] Joelyn Marsh at [facility]  . Depression   . Encounter for care of pacemaker 02/13/2019  . GERD  (gastroesophageal reflux disease)   . History of stomach ulcers 2001  . Hypertension   . LBBB (left bundle branch block)   . Lower extremity edema   . Mild intermittent asthma without complication   . Mixed hyperlipidemia   . Mobitz type 2 second degree AV block 04/06/2019  . Neuropathy   . Pacemaker: Medtronic Azure XT DR MRI [id number] PPM -  BUNDLE OF HIS pacing  02/26/2017   Scheduled Remote pacemaker check  11/12/2018:  There were 24 Fast AV episodes:  EGMs show SVTs. Episodes lasted < 2 minutes. Health trends do not demonstrate significant abnormality. Battery longevity is 9.4 - 10.3 years. RA pacing is 47.1 %, RV pacing is 40.4 %.  Clinic check 11/07/17.   . Paroxysmal atrial flutter ([name])   . PONV (postoperative nausea and vomiting)   . Prostatitis   . Schizophrenia ([name])   . Sinus node dysfunction ([name]) 02/13/2019    Past Surgical History:  Procedure Laterality Date  . APPENDECTOMY    . ELECTROPHYSIOLOGY STUDY N/A 02/26/2017   Procedure: ELECTROPHYSIOLOGY STUDY;  Surgeon: Evans Lance, MD;  Location: [facility];  Service: Cardiovascular;  Laterality: N/A;  . ESOPHAGOGASTRODUODENOSCOPY (EGD) WITH PROPOFOL N/A 09/18/2016   Procedure: ESOPHAGOGASTRODUODENOSCOPY (EGD) WITH PROPOFOL;  Surgeon: Ronnette Juniper, MD;  Location: [facility];  Service: Gastroenterology;  Laterality: N/A;  . LUMBAR LAMINECTOMY/DECOMPRESSION MICRODISCECTOMY  04/19/2011   Procedure: LUMBAR LAMINECTOMY/DECOMPRESSION MICRODISCECTOMY;  Surgeon: Tobi Bastos;  Location: [facility];  Service: Orthopedics;  Laterality: Left;  Hemi LAminectomy/Microdiscectomy Lumbar four  - Lumbar five  on the Left (X-Ray)  .  PACEMAKER IMPLANT N/A 02/26/2017   Procedure: PACEMAKER IMPLANT;  Surgeon: Evans Lance, MD;  Location: [facility];  Service: Cardiovascular;  Laterality: N/A;  . TONSILLECTOMY      Family History  Problem Relation Age of Onset  . High blood pressure Mother   . Alzheimer's disease Father      Social History   Socioeconomic History  . Marital status: Married    Spouse name: Not on file  . Number of children: 1  . Years of education: 83  . Highest education level: High school graduate  Occupational History  . Occupation: Retired  Tobacco Use  . Smoking status: Former Smoker    Packs/day: 1.00    Years: 30.00    Pack years: 30.00    Quit date: 05/08/2008    Years since quitting: 11.8  . Smokeless tobacco: Never Used  Vaping Use  . Vaping Use: Never used  Substance and Sexual Activity  . Alcohol use: No  . Drug use: No  . Sexual activity: Never  Other Topics Concern  . Not on file  Social History Narrative   Lives at home with wife.   Right-handed.   One cup caffeine per day.   Social Determinants of Health   Financial Resource Strain:   . Difficulty of Paying Living Expenses: Not on file  Food Insecurity:   . Worried About Charity fundraiser in the Last Year: Not on file  . Ran Out of Food in the Last Year: Not on file  Transportation Needs:   . Lack of Transportation (Medical): Not on file  . Lack of Transportation (Non-Medical): Not on file  Physical Activity:   . Days of Exercise per Week: Not on file  . Minutes of Exercise per Session: Not on file  Stress:   . Feeling of Stress : Not on file  Social Connections:   . Frequency of Communication with Friends and Family: Not on file  . Frequency of Social Gatherings with Friends and Family: Not on file  . Attends Religious Services: Not on file  . Active Member of Clubs or Organizations: Not on file  . Attends Archivist Meetings: Not on file  . Marital Status: Not on file  Intimate Partner Violence:   . Fear of Current or Ex-Partner: Not on file  . Emotionally Abused: Not on file  . Physically Abused: Not on file  . Sexually Abused: Not on file    Outpatient Medications Prior to Visit  Medication Sig Dispense Refill  . acetaminophen (TYLENOL) 325 MG tablet Take 650 mg by mouth  every 6 (six) hours as needed for mild pain or headache.    . albuterol (VENTOLIN HFA) 108 (90 Base) MCG/ACT inhaler Inhale 2 puffs into the lungs every 6 (six) hours as needed for wheezing or shortness of breath. 8 g 11  . budesonide-formoterol (SYMBICORT) 160-4.5 MCG/ACT inhaler Inhale 2 puffs into the lungs 2 (two) times daily. 1 each 2  . buPROPion (WELLBUTRIN SR) 150 MG 12 hr tablet Take 150 mg by mouth 2 (two) times daily.     . Cyanocobalamin (VITAMIN B-12 PO) Take 1 tablet by mouth daily.    . Fenofibrate 50 MG CAPS Take 1 capsule (50 mg total) by mouth daily. 30 capsule 11  . fluticasone (FLONASE) 50 MCG/ACT nasal spray Place 1 spray into both nostrils daily as needed for allergies. 11.1 mL 11  . gabapentin (NEURONTIN) 100 MG capsule Take 1 capsule (100 mg  total) by mouth 3 (three) times daily. 90 capsule 11  . hydrALAZINE (APRESOLINE) 50 MG tablet Take 1 tablet (50 mg total) by mouth 3 (three) times daily. 270 tablet 2  . isosorbide dinitrate (ISORDIL) 30 MG tablet TAKE 1 TABLET(30 MG) BY MOUTH FOUR TIMES DAILY 360 tablet 2  . metoprolol tartrate (LOPRESSOR) 100 MG tablet Take 1 tablet (100 mg total) by mouth 2 (two) times daily. 180 tablet 2  . montelukast (SINGULAIR) 10 MG tablet Take 1 tablet (10 mg total) by mouth at bedtime. 90 tablet 3  . ondansetron (ZOFRAN ODT) 4 MG disintegrating tablet Take 1 tablet (4 mg total) by mouth every 8 (eight) hours as needed for nausea or vomiting. 20 tablet 0  . paliperidone (INVEGA) 6 MG 24 hr tablet Take 6 mg by mouth at bedtime.    . pantoprazole (PROTONIX) 40 MG tablet Take 1 tablet (40 mg total) by mouth 2 (two) times daily. 180 tablet 2  . sucralfate (CARAFATE) 1 GM/10ML suspension Take 10 mLs (1 g total) by mouth 4 (four) times daily -  with meals and at bedtime. 420 mL 0  . tamsulosin (FLOMAX) 0.4 MG CAPS capsule Take 1 capsule (0.4 mg total) by mouth daily. 90 capsule 2  . benazepril (LOTENSIN) 10 MG tablet Take 1 tablet (10 mg total) by  mouth daily. 90 tablet 1  . benazepril (LOTENSIN) 20 MG tablet Take 1 tablet by mouth 2 (two) times daily.     . cloNIDine (CATAPRES) 0.2 MG tablet Take 1 tablet (0.2 mg total) by mouth 2 (two) times daily. 60 tablet 2  . torsemide (DEMADEX) 10 MG tablet Take 1 tablet (10 mg total) by mouth daily. 90 tablet 2   Facility-Administered Medications Prior to Visit  Medication Dose Route Frequency Provider Last Rate Last Admin  . acetaminophen (OFIRMEV) IVPB    PRN Lissa Morales, CRNA   1,000 mg at 04/19/11 0910  . ePHEDrine injection    PRN Lissa Morales, CRNA   5 mg at 04/19/11 1018  . glycopyrrolate (ROBINUL) injection    PRN Lissa Morales, CRNA   0.8 mg at 04/19/11 1037  . SUFentanil (SUFENTA) injection    PRN Lissa Morales, CRNA   10 mcg at 04/19/11 1008    Allergies  Allergen Reactions  . Methylpyrrolidone Hives    froze the intestine  . Niacin Other (See Comments) and Nausea And Vomiting    Flushing, itching, tingling   . Norvasc [Amlodipine Besylate] Other (See Comments)    Swollen Feet  . Oxybutynin Chloride [Oxybutynin Chloride Er] Other (See Comments)    froze the intestine  . Vesicare [Solifenacin Succinate] Other (See Comments)    Froze the intestine   . Ciprofloxacin Rash and Other (See Comments)    Felt flushed   . Oxybutynin Rash  . Solifenacin Rash    ROS Review of Systems  Respiratory: Negative for shortness of breath.   Cardiovascular: Negative for chest pain.  Gastrointestinal: Positive for abdominal pain (left upper quad hx of pancreatits) and constipation.  Skin: Negative.   Neurological: Negative for dizziness and light-headedness.  Hematological: Negative.       Objective:    Physical Exam Constitutional:      Appearance: He is obese.  HENT:     Head: Normocephalic and atraumatic.     Nose: Nose normal.     Mouth/Throat:     Mouth: Mucous membranes are moist.  Cardiovascular:     Rate and  Rhythm: Normal rate and regular rhythm.     Pulses:  Normal pulses.     Heart sounds: Normal heart sounds.  Pulmonary:     Effort: Pulmonary effort is normal.     Breath sounds: Normal breath sounds.  Abdominal:     Palpations: Abdomen is soft.     Comments: Increased abdominal girth   Musculoskeletal:        General: Normal range of motion.     Cervical back: Normal range of motion.  Skin:    General: Skin is warm and dry.     Capillary Refill: Capillary refill takes less than 2 seconds.  Neurological:     General: No focal deficit present.     Mental Status: He is alert and oriented to person, place, and time.  Psychiatric:        Mood and Affect: Mood normal.        Behavior: Behavior normal.        Thought Content: Thought content normal.        Judgment: Judgment normal.     BP (!) 154/77 (BP Location: Right Arm, Patient Position: Sitting, Cuff Size: Normal)   Pulse 77   Temp (!) 97.4 F (36.3 C) (Temporal)   Resp 16   Ht 6\' 1"  (1.854 m)   Wt 236 lb 9.6 oz (107.3 kg)   SpO2 99%   BMI 31.22 kg/m  Wt Readings from Last 3 Encounters:  03/23/20 236 lb (107 kg)  03/22/20 236 lb 9.6 oz (107.3 kg)  03/16/20 235 lb (106.6 kg)     Health Maintenance Due  Topic Date Due  . Hepatitis C Screening  Never done  . COLONOSCOPY  Never done    There are no preventive care reminders to display for this patient.  Lab Results  Component Value Date   TSH 1.140 09/08/2019   Lab Results  Component Value Date   WBC 8.7 03/16/2020   HGB 14.1 03/16/2020   HCT 45.1 03/16/2020   MCV 90.6 03/16/2020   PLT 259 03/16/2020   Lab Results  Component Value Date   NA 136 03/16/2020   K 4.4 03/16/2020   CO2 22 03/16/2020   GLUCOSE 119 (H) 03/16/2020   BUN 19 03/16/2020   CREATININE 2.33 (H) 03/16/2020   BILITOT 0.7 03/16/2020   ALKPHOS 50 03/16/2020   AST 17 03/16/2020   ALT 11 03/16/2020   PROT 7.2 03/16/2020   ALBUMIN 4.0 03/16/2020   CALCIUM 9.5 03/16/2020   ANIONGAP 12 03/16/2020   Lab Results  Component Value Date    CHOL 154 02/11/2020   Lab Results  Component Value Date   HDL 25 (L) 02/11/2020   Lab Results  Component Value Date   LDLCALC 98 02/11/2020   Lab Results  Component Value Date   TRIG 176 (H) 02/11/2020   Lab Results  Component Value Date   CHOLHDL 6.2 (H) 02/11/2020   Lab Results  Component Value Date   HGBA1C 5.5 09/08/2019      Assessment & Plan:   Problem List Items Addressed This Visit      Cardiovascular and Mediastinum   Essential hypertension - Primary Stable we will continue with current regimen and follow-up with cardiology as scheduled   Relevant Orders   POCT URINALYSIS DIP (CLINITEK) (Completed)     Respiratory   Asthma Stable continue with albuterol 108 mcg 2 puffs every 6 hours as needed and budesonide formoterol 106 per 4.5 mcg ACT to 2  puffs daily and montelukast 10 mg daily     Digestive   GERD (gastroesophageal reflux disease) Pantoprazole 40 mg twice daily and sucralfate 10 mL 4 times daily follow-up with gastroenterology as scheduled     Genitourinary   CKD (chronic kidney disease), stage III ([place])   Relevant Orders   POCT URINALYSIS DIP (CLINITEK) (Completed)      No orders of the defined types were placed in this encounter.   Follow-up: Return in about 6 months (around 09/19/2020).    Vevelyn Francois, NP

## 2020-03-22 NOTE — Patient Instructions (Signed)

## 2020-03-23 ENCOUNTER — Ambulatory Visit (INDEPENDENT_AMBULATORY_CARE_PROVIDER_SITE_OTHER): Payer: PPO | Admitting: Gastroenterology

## 2020-03-23 ENCOUNTER — Telehealth: Payer: Self-pay

## 2020-03-23 VITALS — BP 132/80 | HR 62 | Ht 73.0 in | Wt 236.0 lb

## 2020-03-23 DIAGNOSIS — K861 Other chronic pancreatitis: Secondary | ICD-10-CM | POA: Diagnosis not present

## 2020-03-23 DIAGNOSIS — R109 Unspecified abdominal pain: Secondary | ICD-10-CM | POA: Diagnosis not present

## 2020-03-23 DIAGNOSIS — K2270 Barrett's esophagus without dysplasia: Secondary | ICD-10-CM

## 2020-03-23 NOTE — Patient Instructions (Addendum)
I recommend that you continue to take sucralfate and pantoprazole.  Please resume your Creon. You may open the capsule and sprinkle the medicine over applesauce.  I have recommended an upper endoscopy with biopsies to follow-up on your Barrett's and to evaluate your pain.  ENDOSCOPY: You have been scheduled for an endoscopy. Please follow written instructions given to you at your visit today.  INHALERS: If you use inhalers (even only as needed), please bring them with you on the day of your procedure.  While we are waiting for the endoscopy, I will obtain and review your prior records from Drs. Hung and [facility].  If you are age 61 or younger, your body mass index should be between 19-25. Your There is no height or weight on file to calculate BMI. If this is out of the aformentioned range listed, please consider follow up with your Primary Care Provider.   It was a pleasure to meet you today. Thank you for trusting me with your gastrointestinal care!    Thornton Park, MD, MPH

## 2020-03-23 NOTE — Progress Notes (Signed)
Referring Provider: Vevelyn Francois, NP Primary Care Physician:  Vevelyn Francois, NP  Reason for Consultation:    IMPRESSION:  Ongoing abdominal pain Chronic pancreatitis by CT scan 2016 Barrett's Esophagus with esophageal ulcerations seen on EGD 2018 Chronic constipation that the patient attributes to internal hemorrhoids  Abdominal pain: Differential for abdominal pain is broad, although most likely etiologies include recurrent esophagitis and esophageal ulcerations as well as his chronic pancreatitis +/- complications as he has been off Creon.  We discussed using Creon by opening the capsules and sprinkling them over applesauce to improve compliance.  We will proceed with EGD with biopsies for further evaluation.  Continue Carafate and pantoprazole in the meantime.  Chronic pancreatitis: We will obtain prior records from Dr. Alessandra Bevels to determine how the diagnosis was made.  Barrett's Esophagus: Last EGD 2018. Large ulcers present at that time. Repeat EGD with Barrett's biopsies recommended. Continue pantoprazole and Carafate.   Intermittent rectal bleeding that the patient attributes to hemorrhoids: We will obtain prior colonoscopy report from Dr. Benson Norway.  PLAN: Diet recommendation: consume low-fat meals, eat multiple small meals, and avoid dehydration Obtain most recent records from Dr. Alessandra Bevels and Dr. Benson Norway EGD with Barrett's biopsies Resume Creon Continue sucralfate and pantoprazole  Please see the "Patient Instructions" section for addition details about the plan.  HPI: Peter Marsh is a 59 y.o. male looking to establish with a new GI practice. Previously seen by Dr. Benson Norway and multiple providers at [place]. Most recently seen by [facility] 08/2019. The history is obtained through the patient and review of his electronic health record.  He has asthma, atrial fibrillation, chronic kidney disease, COPD, reflux, hypertension, hypercholesterolemia, interstitial cystitis.  He  has chronic abdominal pain with chronic pancreatitis and a history of esophageal ulcers. Retired Biomedical scientist at [employer]. Former smoker.   Followed by Dr. Alessandra Bevels for chronic pancreatitis with associated nausea and vomiting. Patient unable to explain how the diagnosis was made. Previously prescribed Creon in 2019 with improvement of symptoms at that time. However, recently he hasn't been able to tolerate the large Creon pills. He didn't realize that he could open them up and take them with applesauce. No steatorrhea, maldigestion, on known history of pancreatic pseudocyst, bile duct or duodenal obstruction.  Chronic constipation routinely going 2-3 days between bowel movements. Some intermittent rectal bleeding that he attributes to hemorrhoids.   Endoscopic history: - EGD/Colon around 2013 Benson Norway): ? Polyps. Did not tolerate the bowel prep.  - EGD 09/18/16 Therisa [facility]ne) for epigastric pain: antral inflammation; Barrett's esophagus with associated exudate/ulcer with the ulcer measuring 62mm  Prior abdominal imaging: - Gastric emptying study 2010: 30% retention at 2 hours - CT abd/pelvis without contrast 2015: Tiny calcifications throughout pancreas consistent with chronic  calcific pancreatitis. - CT abd/pelvis without contrast 2016: Diffuse speckled calcification within the pancreatic parenchyma is compatible with chronic pancreatitis. No dilatation of the main pancreatic duct. Kidney cysts - RUQ ultrasound 2018: normal gallbladder, CBD, liver, large cystic mass in the right kidney - Abdominal MRI to follow-up on renal lesions 2020: stomach and bowel mentioned as normal, abnormal kidneys  No known family history of colon cancer or polyps. No family history of uterine/endometrial cancer, pancreatic cancer or gastric/stomach cancer.   Past Medical History:  Diagnosis Date  . Asthma    uses inhalers   . Bilateral carotid bruits   . Cardiac conduction disorder 2018   s/p MDT PPM  . Chronic kidney disease     bladder interstial cystitis   .  Chronic kidney disease (CKD), stage IV (severe) ([name])    followed by Dr. Joelyn Oms at [facility]  . Depression   . Encounter for care of pacemaker 02/13/2019  . GERD (gastroesophageal reflux disease)   . History of stomach ulcers 2001  . Hypertension   . LBBB (left bundle branch block)   . Lower extremity edema   . Mild intermittent asthma without complication   . Mixed hyperlipidemia   . Mobitz type 2 second degree AV block 04/06/2019  . Neuropathy   . Pacemaker: Medtronic Azure XT DR MRI [id number] PPM -  BUNDLE OF HIS pacing  02/26/2017   Scheduled Remote pacemaker check  11/12/2018:  There were 24 Fast AV episodes:  EGMs show SVTs. Episodes lasted < 2 minutes. Health trends do not demonstrate significant abnormality. Battery longevity is 9.4 - 10.3 years. RA pacing is 47.1 %, RV pacing is 40.4 %.  Clinic check 11/07/17.   . Paroxysmal atrial flutter ([place])   . PONV (postoperative nausea and vomiting)   . Prostatitis   . Schizophrenia ([place])   . Sinus node dysfunction ([place]) 02/13/2019    Past Surgical History:  Procedure Laterality Date  . APPENDECTOMY    . ELECTROPHYSIOLOGY STUDY N/A 02/26/2017   Procedure: ELECTROPHYSIOLOGY STUDY;  Surgeon: Evans Lance, MD;  Location: [facility];  Service: Cardiovascular;  Laterality: N/A;  . ESOPHAGOGASTRODUODENOSCOPY (EGD) WITH PROPOFOL N/A 09/18/2016   Procedure: ESOPHAGOGASTRODUODENOSCOPY (EGD) WITH PROPOFOL;  Surgeon: Ronnette Juniper, MD;  Location: [facility];  Service: Gastroenterology;  Laterality: N/A;  . LUMBAR LAMINECTOMY/DECOMPRESSION MICRODISCECTOMY  04/19/2011   Procedure: LUMBAR LAMINECTOMY/DECOMPRESSION MICRODISCECTOMY;  Surgeon: Tobi Bastos;  Location: [facility];  Service: Orthopedics;  Laterality: Left;  Hemi LAminectomy/Microdiscectomy Lumbar four  - Lumbar five  on the Left (X-Ray)  . PACEMAKER IMPLANT N/A 02/26/2017   Procedure: PACEMAKER IMPLANT;  Surgeon: Evans Lance, MD;   Location: [facility];  Service: Cardiovascular;  Laterality: N/A;  . TONSILLECTOMY      Current Outpatient Medications  Medication Sig Dispense Refill  . acetaminophen (TYLENOL) 325 MG tablet Take 650 mg by mouth every 6 (six) hours as needed for mild pain or headache.    . albuterol (VENTOLIN HFA) 108 (90 Base) MCG/ACT inhaler Inhale 2 puffs into the lungs every 6 (six) hours as needed for wheezing or shortness of breath. 8 g 11  . benazepril (LOTENSIN) 10 MG tablet Take 1 tablet (10 mg total) by mouth daily. 90 tablet 1  . benazepril (LOTENSIN) 20 MG tablet Take 1 tablet by mouth 2 (two) times daily.     . budesonide-formoterol (SYMBICORT) 160-4.5 MCG/ACT inhaler Inhale 2 puffs into the lungs 2 (two) times daily. 1 each 2  . buPROPion (WELLBUTRIN SR) 150 MG 12 hr tablet Take 150 mg by mouth 2 (two) times daily.     . Cyanocobalamin (VITAMIN B-12 PO) Take 1 tablet by mouth daily.    . Fenofibrate 50 MG CAPS Take 1 capsule (50 mg total) by mouth daily. 30 capsule 11  . fluticasone (FLONASE) 50 MCG/ACT nasal spray Place 1 spray into both nostrils daily as needed for allergies. 11.1 mL 11  . gabapentin (NEURONTIN) 100 MG capsule Take 1 capsule (100 mg total) by mouth 3 (three) times daily. 90 capsule 11  . hydrALAZINE (APRESOLINE) 50 MG tablet Take 1 tablet (50 mg total) by mouth 3 (three) times daily. 270 tablet 2  . isosorbide dinitrate (ISORDIL) 30 MG tablet TAKE 1 TABLET(30 MG) BY  MOUTH FOUR TIMES DAILY 360 tablet 2  . metoprolol tartrate (LOPRESSOR) 100 MG tablet Take 1 tablet (100 mg total) by mouth 2 (two) times daily. 180 tablet 2  . montelukast (SINGULAIR) 10 MG tablet Take 1 tablet (10 mg total) by mouth at bedtime. 90 tablet 3  . ondansetron (ZOFRAN ODT) 4 MG disintegrating tablet Take 1 tablet (4 mg total) by mouth every 8 (eight) hours as needed for nausea or vomiting. 20 tablet 0  . paliperidone (INVEGA) 6 MG 24 hr tablet Take 6 mg by mouth at bedtime.    . Pancrelipase,  Lip-Prot-Amyl, (ZENPEP) 40000-126000 units CPEP Take by mouth.    . pantoprazole (PROTONIX) 40 MG tablet Take 1 tablet (40 mg total) by mouth 2 (two) times daily. 180 tablet 2  . sucralfate (CARAFATE) 1 GM/10ML suspension Take 10 mLs (1 g total) by mouth 4 (four) times daily -  with meals and at bedtime. 420 mL 0  . tamsulosin (FLOMAX) 0.4 MG CAPS capsule Take 1 capsule (0.4 mg total) by mouth daily. 90 capsule 2  . torsemide (DEMADEX) 10 MG tablet Take 1 tablet (10 mg total) by mouth daily. 90 tablet 2  . cloNIDine (CATAPRES) 0.2 MG tablet Take 1 tablet (0.2 mg total) by mouth 2 (two) times daily. 60 tablet 2   No current facility-administered medications for this visit.   Facility-Administered Medications Ordered in Other Visits  Medication Dose Route Frequency Provider Last Rate Last Admin  . acetaminophen (OFIRMEV) IVPB    PRN Lissa Morales, CRNA   1,000 mg at 04/19/11 0910  . ePHEDrine injection    PRN Lissa Morales, CRNA   5 mg at 04/19/11 1018  . glycopyrrolate (ROBINUL) injection    PRN Lissa Morales, CRNA   0.8 mg at 04/19/11 1037  . SUFentanil (SUFENTA) injection    PRN Lissa Morales, CRNA   10 mcg at 04/19/11 1008    Allergies as of 03/23/2020 - Review Complete 03/23/2020  Allergen Reaction Noted  . Methylpyrrolidone Hives 07/21/2014  . Niacin Other (See Comments) and Nausea And Vomiting 08/30/2012  . Norvasc [amlodipine besylate] Other (See Comments) 10/15/2017  . Oxybutynin chloride [oxybutynin chloride er] Other (See Comments) 04/13/2011  . Vesicare [solifenacin succinate] Other (See Comments) 04/13/2011  . Ciprofloxacin Rash and Other (See Comments) 10/26/2010  . Oxybutynin Rash 10/09/2011  . Solifenacin Rash 10/09/2011    Family History  Problem Relation Age of Onset  . High blood pressure Mother   . Alzheimer's disease Father     Social History   Socioeconomic History  . Marital status: Married    Spouse name: Not on file  . Number of children: 1  . Years  of education: 27  . Highest education level: High school graduate  Occupational History  . Occupation: Retired  Tobacco Use  . Smoking status: Former Smoker    Packs/day: 1.00    Years: 30.00    Pack years: 30.00    Quit date: 05/08/2008    Years since quitting: 11.8  . Smokeless tobacco: Never Used  Vaping Use  . Vaping Use: Never used  Substance and Sexual Activity  . Alcohol use: No  . Drug use: No  . Sexual activity: Never  Other Topics Concern  . Not on file  Social History Narrative   Lives at home with wife.   Right-handed.   One cup caffeine per day.   Social Determinants of Health   Financial Resource Strain:   .  Difficulty of Paying Living Expenses: Not on file  Food Insecurity:   . Worried About Charity fundraiser in the Last Year: Not on file  . Ran Out of Food in the Last Year: Not on file  Transportation Needs:   . Lack of Transportation (Medical): Not on file  . Lack of Transportation (Non-Medical): Not on file  Physical Activity:   . Days of Exercise per Week: Not on file  . Minutes of Exercise per Session: Not on file  Stress:   . Feeling of Stress : Not on file  Social Connections:   . Frequency of Communication with Friends and Family: Not on file  . Frequency of Social Gatherings with Friends and Family: Not on file  . Attends Religious Services: Not on file  . Active Member of Clubs or Organizations: Not on file  . Attends Archivist Meetings: Not on file  . Marital Status: Not on file  Intimate Partner Violence:   . Fear of Current or Ex-Partner: Not on file  . Emotionally Abused: Not on file  . Physically Abused: Not on file  . Sexually Abused: Not on file    Review of Systems: 12 system ROS is negative except as noted above and including back pain, fatigue, itching associated with chronic kidney disease, insomnia when he has severe pain, and occasional swelling of his feet.[name]   Physical Exam: General:   Alert,  well-nourished,  pleasant and cooperative in NAD Head:  Normocephalic and atraumatic. Eyes:  Sclera clear, no icterus.   Conjunctiva pink. Ears:  Normal auditory acuity. Nose:  No deformity, discharge,  or lesions. Mouth:  No deformity or lesions.   Neck:  Supple; no masses or thyromegaly. Lungs:  Clear throughout to auscultation.   No wheezes. Heart:  Regular rate and rhythm; no murmurs. Abdomen:  Soft, central obesity, ventral hernia, nontender, nondistended, normal bowel sounds, no rebound or guarding. No hepatosplenomegaly.   Rectal:  Deferred  Msk:  Symmetrical. No boney deformities LAD: No inguinal or umbilical LAD Extremities:  No clubbing or edema. Neurologic:  Alert and  oriented x4;  grossly nonfocal Skin:  Intact without significant lesions or rashes. Psych:  Alert and cooperative. Normal mood and affect.     Jerald Hennington L. Tarri Glenn, MD, MPH 03/23/2020, 2:54 PM

## 2020-03-23 NOTE — Telephone Encounter (Signed)
FAXED DOCUMENTS  Provider(s): Dr. Benson Norway and [facility]  Document: Signed Medical Records Release  Above information has been faxed successfully to the Provider(s) offices listed above. Documents and fax confirmation have been placed in the faxed file for future reference.

## 2020-03-24 ENCOUNTER — Encounter: Payer: Self-pay | Admitting: Gastroenterology

## 2020-03-24 NOTE — Telephone Encounter (Signed)
Preferred Pharmacies    [id number] - [place], [place] Phone:  [phone]  Fax:  [phone]      Pharmacy has been updated per pt request.

## 2020-03-24 NOTE — Addendum Note (Signed)
Addended by: Donell Beers on: 03/24/2020 11:06 AM   Modules accepted: Level of Service

## 2020-03-24 NOTE — Telephone Encounter (Signed)
Mr Pentecost forgot to mention during the office visit yesterday that [facility] [place]. Requested for me to make  nurse & physician aware.

## 2020-03-25 DIAGNOSIS — R102 Pelvic and perineal pain: Secondary | ICD-10-CM | POA: Diagnosis not present

## 2020-03-25 DIAGNOSIS — R3914 Feeling of incomplete bladder emptying: Secondary | ICD-10-CM | POA: Diagnosis not present

## 2020-03-25 DIAGNOSIS — N281 Cyst of kidney, acquired: Secondary | ICD-10-CM | POA: Diagnosis not present

## 2020-03-25 DIAGNOSIS — N130 Hydronephrosis with ureteropelvic junction obstruction: Secondary | ICD-10-CM | POA: Diagnosis not present

## 2020-03-29 ENCOUNTER — Ambulatory Visit (INDEPENDENT_AMBULATORY_CARE_PROVIDER_SITE_OTHER): Payer: PPO

## 2020-03-29 ENCOUNTER — Telehealth: Payer: Self-pay

## 2020-03-29 DIAGNOSIS — R55 Syncope and collapse: Secondary | ICD-10-CM

## 2020-03-29 LAB — CUP PACEART REMOTE DEVICE CHECK
Battery Remaining Longevity: 92 mo
Battery Voltage: 3.01 V
Brady Statistic AP VP Percent: 0.07 %
Brady Statistic AP VS Percent: 87.05 %
Brady Statistic AS VP Percent: 0.01 %
Brady Statistic AS VS Percent: 12.87 %
Brady Statistic RA Percent Paced: 87.17 %
Brady Statistic RV Percent Paced: 0.08 %
Date Time Interrogation Session: 20211121194128
Implantable Lead Implant Date: 20181022
Implantable Lead Implant Date: 20181022
Implantable Lead Location: 753859
Implantable Lead Location: 753860
Implantable Lead Model: 3830
Implantable Lead Model: 5076
Implantable Pulse Generator Implant Date: 20181022
Lead Channel Impedance Value: 323 Ohm
Lead Channel Impedance Value: 342 Ohm
Lead Channel Impedance Value: 437 Ohm
Lead Channel Impedance Value: 456 Ohm
Lead Channel Pacing Threshold Amplitude: 0.625 V
Lead Channel Pacing Threshold Amplitude: 1 V
Lead Channel Pacing Threshold Pulse Width: 0.4 ms
Lead Channel Pacing Threshold Pulse Width: 0.4 ms
Lead Channel Sensing Intrinsic Amplitude: 2.875 mV
Lead Channel Sensing Intrinsic Amplitude: 2.875 mV
Lead Channel Sensing Intrinsic Amplitude: 8.5 mV
Lead Channel Sensing Intrinsic Amplitude: 8.5 mV
Lead Channel Setting Pacing Amplitude: 1.5 V
Lead Channel Setting Pacing Amplitude: 3.5 V
Lead Channel Setting Pacing Pulse Width: 1 ms
Lead Channel Setting Sensing Sensitivity: 1.2 mV

## 2020-03-29 NOTE — Telephone Encounter (Signed)
Received copies of 03/02/11 Colonoscopy and EGD reports from Dr. Ulyses Amor office. Documents have been labeled and placed on Dr. Tarri Glenn desk for her review.

## 2020-03-30 NOTE — Progress Notes (Signed)
Remote pacemaker transmission.   

## 2020-04-07 ENCOUNTER — Ambulatory Visit (AMBULATORY_SURGERY_CENTER): Payer: PPO | Admitting: Gastroenterology

## 2020-04-07 ENCOUNTER — Other Ambulatory Visit: Payer: Self-pay

## 2020-04-07 ENCOUNTER — Telehealth: Payer: Self-pay

## 2020-04-07 ENCOUNTER — Encounter: Payer: Self-pay | Admitting: Gastroenterology

## 2020-04-07 VITALS — BP 132/86 | HR 67 | Temp 97.0°F | Resp 17 | Ht 73.0 in | Wt 236.0 lb

## 2020-04-07 DIAGNOSIS — I129 Hypertensive chronic kidney disease with stage 1 through stage 4 chronic kidney disease, or unspecified chronic kidney disease: Secondary | ICD-10-CM | POA: Diagnosis not present

## 2020-04-07 DIAGNOSIS — K2270 Barrett's esophagus without dysplasia: Secondary | ICD-10-CM | POA: Diagnosis not present

## 2020-04-07 DIAGNOSIS — K2970 Gastritis, unspecified, without bleeding: Secondary | ICD-10-CM

## 2020-04-07 DIAGNOSIS — R1084 Generalized abdominal pain: Secondary | ICD-10-CM | POA: Diagnosis not present

## 2020-04-07 DIAGNOSIS — K219 Gastro-esophageal reflux disease without esophagitis: Secondary | ICD-10-CM | POA: Diagnosis not present

## 2020-04-07 DIAGNOSIS — J45909 Unspecified asthma, uncomplicated: Secondary | ICD-10-CM | POA: Diagnosis not present

## 2020-04-07 DIAGNOSIS — K3189 Other diseases of stomach and duodenum: Secondary | ICD-10-CM

## 2020-04-07 DIAGNOSIS — N183 Chronic kidney disease, stage 3 unspecified: Secondary | ICD-10-CM | POA: Diagnosis not present

## 2020-04-07 DIAGNOSIS — F209 Schizophrenia, unspecified: Secondary | ICD-10-CM | POA: Diagnosis not present

## 2020-04-07 DIAGNOSIS — K2950 Unspecified chronic gastritis without bleeding: Secondary | ICD-10-CM | POA: Diagnosis not present

## 2020-04-07 MED ORDER — SODIUM CHLORIDE 0.9 % IV SOLN
500.0000 mL | Freq: Once | INTRAVENOUS | Status: DC
Start: 2020-04-07 — End: 2020-04-07

## 2020-04-07 NOTE — Progress Notes (Signed)
Report to PACU, RN, vss, BBS= Clear.  

## 2020-04-07 NOTE — Patient Instructions (Signed)
Continue present medications.  No aspirin, ibuprofen, naproxen, or other non-steroidal anti-inflammatory drugs   YOU HAD AN ENDOSCOPIC PROCEDURE TODAY AT [facility]:   Refer to the procedure report that was given to you for any specific questions about what was found during the examination.  If the procedure report does not answer your questions, please call your gastroenterologist to clarify.  If you requested that your care partner not be given the details of your procedure findings, then the procedure report has been included in a sealed envelope for you to review at your convenience later.  YOU SHOULD EXPECT: Some feelings of bloating in the abdomen. Passage of more gas than usual.  Walking can help get rid of the air that was put into your GI tract during the procedure and reduce the bloating. If you had a lower endoscopy (such as a colonoscopy or flexible sigmoidoscopy) you may notice spotting of blood in your stool or on the toilet paper. If you underwent a bowel prep for your procedure, you may not have a normal bowel movement for a few days.  Please Note:  You might notice some irritation and congestion in your nose or some drainage.  This is from the oxygen used during your procedure.  There is no need for concern and it should clear up in a day or so.  SYMPTOMS TO REPORT IMMEDIATELY:    Following upper endoscopy (EGD)  Vomiting of blood or coffee ground material  New chest pain or pain under the shoulder blades  Painful or persistently difficult swallowing  New shortness of breath  Fever of 100F or higher  Black, tarry-looking stools  For urgent or emergent issues, a gastroenterologist can be reached at any hour by calling [phone]. Do not use MyChart messaging for urgent concerns.    DIET:  We do recommend a small meal at first, but then you may proceed to your regular diet.  Drink plenty of fluids but you should avoid alcoholic beverages for 24  hours.  ACTIVITY:  You should plan to take it easy for the rest of today and you should NOT DRIVE or use heavy machinery until tomorrow (because of the sedation medicines used during the test).    FOLLOW UP: Our staff will call the number listed on your records 48-72 hours following your procedure to check on you and address any questions or concerns that you may have regarding the information given to you following your procedure. If we do not reach you, we will leave a message.  We will attempt to reach you two times.  During this call, we will ask if you have developed any symptoms of COVID 19. If you develop any symptoms (ie: fever, flu-like symptoms, shortness of breath, cough etc.) before then, please call [phone].  If you test positive for Covid 19 in the 2 weeks post procedure, please call and report this information to [place].    If any biopsies were taken you will be contacted by phone or by letter within the next 1-3 weeks.  Please call us at [phone] if you have not heard about the biopsies in 3 weeks.    SIGNATURES/CONFIDENTIALITY: You and/or your care partner have signed paperwork which will be entered into your electronic medical record.  These signatures attest to the fact that that the information above on your After Visit Summary has been reviewed and is understood.  Full responsibility of the confidentiality of this discharge information lies with you and/or  your care-partner. 

## 2020-04-07 NOTE — Progress Notes (Signed)
Called to room to assist during endoscopic procedure.  Patient ID and intended procedure confirmed with present staff. Received instructions for my participation in the procedure from the performing physician.  

## 2020-04-07 NOTE — Telephone Encounter (Signed)
Records received from [facility] and have been placed on Dr. Tarri Glenn desk for her review.

## 2020-04-07 NOTE — Progress Notes (Signed)
VS- Peter Marsh 

## 2020-04-07 NOTE — Op Note (Signed)
[facility] Patient Name: Peter Marsh Procedure Date: 04/07/2020 10:13 AM MRN: [id number] Endoscopist: Thornton Park MD, MD Age: 59 Referring MD:  Date of Birth: 07/15/1960 Gender: Male Account #: [id number] Procedure:                Upper GI endoscopy Indications:              Abdominal pain, Surveillance for malignancy due to                            personal history of Barrett's esophagus seen on EGD                            2018 with concurrent esophageal ulcerations Medicines:                Monitored Anesthesia Care Procedure:                Pre-Anesthesia Assessment:                           - Prior to the procedure, a History and Physical                            was performed, and patient medications and                            allergies were reviewed. The patient's tolerance of                            previous anesthesia was also reviewed. The risks                            and benefits of the procedure and the sedation                            options and risks were discussed with the patient.                            All questions were answered, and informed consent                            was obtained. Prior Anticoagulants: The patient has                            taken no previous anticoagulant or antiplatelet                            agents. ASA Grade Assessment: III - A patient with                            severe systemic disease. After reviewing the risks                            and benefits, the patient was deemed in  satisfactory condition to undergo the procedure.                           After obtaining informed consent, the endoscope was                            passed under direct vision. Throughout the                            procedure, the patient's blood pressure, pulse, and                            oxygen saturations were monitored continuously. The                             Endoscope was introduced through the mouth, and                            advanced to the third part of duodenum. The upper                            GI endoscopy was accomplished without difficulty.                            The patient tolerated the procedure well. Scope In: Scope Out: Findings:                 The esophagus and gastroesophageal junction were                            examined with white light. There were esophageal                            mucosal changes secondary to established                            long-segment Barrett's disease. These changes                            involved the mucosa at the upper extent of the                            gastric folds (39 cm from the incisors) extending                            to the Z-line (36 cm from the incisors). The                            maximum longitudinal extent of these esophageal                            mucosal changes was 4 cm in length. Mucosa was  biopsied with a cold forceps for histology in 4                            quadrants at intervals of 1 cm in the lower third                            of the esophagus. A total of 4 specimen bottles                            were sent to pathology.                           Patchy mildly erythematous mucosa without bleeding                            was found in the gastric body. Biopsies were taken                            from the antrum, body, and fundus with a cold                            forceps for histology. Estimated blood loss was                            minimal.                           The duodenal bulb was normal. Biopsies were taken                            with a cold forceps for histology. Estimated blood                            loss was minimal.                           The cardia and gastric fundus were normal on                            retroflexion.                           The exam  was otherwise without abnormality. Complications:            No immediate complications. Estimated blood loss:                            Minimal. Estimated Blood Loss:     Estimated blood loss was minimal. Impression:               - Esophageal mucosal changes secondary to                            established long-segment Barrett's disease.  Biopsied.                           - Erythematous mucosa in the gastric body. Biopsied.                           - Normal duodenal bulb. Biopsied.                           - The examination was otherwise normal. Recommendation:           - Patient has a contact number available for                            emergencies. The signs and symptoms of potential                            delayed complications were discussed with the                            patient. Return to normal activities tomorrow.                            Written discharge instructions were provided to the                            patient.                           - Resume previous diet.                           - Continue present medications including                            pantoprazole 40 mg twice daily.                           - No aspirin, ibuprofen, naproxen, or other                            non-steroidal anti-inflammatory drugs.                           - Await pathology results.                           - Repeat upper endoscopy in 3 years for                            surveillance of Barrett's esophagus if today's                            biopsy do not show any dysplasia. Thornton Park MD, MD 04/07/2020 10:36:59 AM This report has been signed electronically.

## 2020-04-09 ENCOUNTER — Telehealth: Payer: Self-pay

## 2020-04-09 ENCOUNTER — Telehealth: Payer: Self-pay | Admitting: *Deleted

## 2020-04-09 NOTE — Telephone Encounter (Signed)
  Follow up Call-  Call back number 04/07/2020  Post procedure Call Back phone  # [phone]  Permission to leave phone message Yes  Some recent data might be hidden     Patient questions:  Message left to call us if necessary.  Second call.

## 2020-04-09 NOTE — Telephone Encounter (Signed)
Called [phone] and left a message we tried to reach pt for a follow up call. maw

## 2020-04-12 ENCOUNTER — Other Ambulatory Visit: Payer: Self-pay

## 2020-04-12 ENCOUNTER — Telehealth: Payer: Self-pay | Admitting: Gastroenterology

## 2020-04-12 DIAGNOSIS — K2270 Barrett's esophagus without dysplasia: Secondary | ICD-10-CM

## 2020-04-12 DIAGNOSIS — R109 Unspecified abdominal pain: Secondary | ICD-10-CM

## 2020-04-12 DIAGNOSIS — K861 Other chronic pancreatitis: Secondary | ICD-10-CM

## 2020-04-12 MED ORDER — PANTOPRAZOLE SODIUM 40 MG PO TBEC: 40.0000 mg | DELAYED_RELEASE_TABLET | Freq: Two times a day (BID) | ORAL | 3 refills | Status: DC

## 2020-04-12 MED ORDER — SUCRALFATE 1 GM/10ML PO SUSP: 1.0000 g | Freq: Three times a day (TID) | ORAL | 3 refills | Status: DC

## 2020-04-12 MED ORDER — ZENPEP 40000-126000 UNITS PO CPEP: 1.0000 | ORAL_CAPSULE | Freq: Three times a day (TID) | ORAL | 2 refills | Status: DC

## 2020-04-12 NOTE — Telephone Encounter (Signed)
E-Prescribing Status: Receipt confirmed by pharmacy (04/12/2020 12:41 PM EST)

## 2020-04-12 NOTE — Telephone Encounter (Signed)
May have 3 month refills of each request. Thanks.

## 2020-04-12 NOTE — Telephone Encounter (Signed)
May provide 3 months of Zenpep 40,000 IU TID. Thanks.

## 2020-04-12 NOTE — Telephone Encounter (Signed)
Please provide frequency of Zenpep. The order is incomplete.

## 2020-04-12 NOTE — Telephone Encounter (Signed)
I have never prescribed this medication for him before. Please confirm his prior dosage. Thanks.

## 2020-04-12 NOTE — Telephone Encounter (Signed)
Called pt to clarify his request and to obtain accurate dosing and frequency. States he was taking Zenpep 40,000iu by mouth TID. Routing this message back to Dr. Tarri Glenn for approval of his request.

## 2020-04-12 NOTE — Telephone Encounter (Signed)
Please advise if you agree to refill requested medications

## 2020-04-12 NOTE — Telephone Encounter (Signed)
Outpatient Medication Detail   Disp Refills Start End   Pancrelipase, Lip-Prot-Amyl, (ZENPEP) 40000-126000 units CPEP 90 capsule 2 04/12/2020    Sig - Route: Take 1 tablet by mouth with breakfast, with lunch, and with evening meal. - Oral   Sent to pharmacy as: Pancrelipase, Lip-Prot-Amyl, (ZENPEP) 40000-126000 units Cap DR Particles   E-Prescribing Status: Receipt confirmed by pharmacy (04/12/2020  1:56 PM EST)

## 2020-04-14 DIAGNOSIS — N2581 Secondary hyperparathyroidism of renal origin: Secondary | ICD-10-CM | POA: Diagnosis not present

## 2020-04-14 DIAGNOSIS — I129 Hypertensive chronic kidney disease with stage 1 through stage 4 chronic kidney disease, or unspecified chronic kidney disease: Secondary | ICD-10-CM | POA: Diagnosis not present

## 2020-04-14 DIAGNOSIS — F209 Schizophrenia, unspecified: Secondary | ICD-10-CM | POA: Diagnosis not present

## 2020-04-14 DIAGNOSIS — N1832 Chronic kidney disease, stage 3b: Secondary | ICD-10-CM | POA: Diagnosis not present

## 2020-04-14 DIAGNOSIS — N1330 Unspecified hydronephrosis: Secondary | ICD-10-CM | POA: Diagnosis not present

## 2020-04-14 DIAGNOSIS — D631 Anemia in chronic kidney disease: Secondary | ICD-10-CM | POA: Diagnosis not present

## 2020-04-14 DIAGNOSIS — N189 Chronic kidney disease, unspecified: Secondary | ICD-10-CM | POA: Diagnosis not present

## 2020-04-15 ENCOUNTER — Telehealth: Payer: Self-pay | Admitting: Cardiovascular Disease

## 2020-04-15 DIAGNOSIS — I10 Essential (primary) hypertension: Secondary | ICD-10-CM

## 2020-04-15 MED ORDER — HYDRALAZINE HCL 50 MG PO TABS
50.0000 mg | ORAL_TABLET | Freq: Three times a day (TID) | ORAL | 1 refills | Status: DC
Start: 2020-04-15 — End: 2022-07-28

## 2020-04-15 MED ORDER — METOPROLOL TARTRATE 100 MG PO TABS
100.0000 mg | ORAL_TABLET | Freq: Two times a day (BID) | ORAL | 1 refills | Status: DC
Start: 2020-04-15 — End: 2022-05-27

## 2020-04-15 MED ORDER — ISOSORBIDE DINITRATE 30 MG PO TABS: ORAL_TABLET | ORAL | 1 refills | Status: DC

## 2020-04-15 NOTE — Telephone Encounter (Signed)
*  STAT* If patient is at the pharmacy, call can be transferred to refill team.   1. Which medications need to be refilled? (please list name of each medication and dose if known) hydrALAZINE (APRESOLINE) 50 MG tablet, isosorbide dinitrate (ISORDIL) 30 MG tablet, metoprolol tartrate (LOPRESSOR) 100 MG tablet  2. Which pharmacy/location (including street and city if local pharmacy) is medication to be sent to? [id number] - [place], [place] - [address] AT [place]  3. Do they need a 30 day or 90 day supply? [place]

## 2020-04-19 ENCOUNTER — Telehealth: Payer: Self-pay | Admitting: Gastroenterology

## 2020-04-19 NOTE — Telephone Encounter (Signed)
Appears Rx was sent as Rx'd by Dr. Tarri Glenn on 04/12/20. Called pt to inquire further about his concern. States he was taking pantoprazole QD and since Dr. Tarri Glenn increased the dosage, he doubled the Rx he had already filled by his PCP. Asked if he attempted refill the PCP's Rx's by using the Rx # on the bottle. Pt did not answer my question but rather stated he was told by the pharmacy the refill was too soon. Advised the response would make sense IF trying to fill an old Rx. Advised that NEW Rx with the CORRECT and NEW dosage has been sent to his pharmacy and that our Rx would negate his PCP's OLD RX. Pt states, "lady you're not understanding me. They won't fill it". Informed pt that I believe he misunderstood the information received from the pharmacy and would call his pharmacy to inquire further. Pt continued to insist the Rx needed to be re-sent to his pharmacy. Reiterated that I will call the pharmacy to resolve BOTH his and the pharmacy's misunderstanding.  Immediately called pharmacy and spoke with Lanelle Bal. States 04/12/20 Rx we sent has been filled and has been waiting for pt to pick up. Lanelle Bal confirmed pt IS misunderstanding and DID likely attempt to refill an old Rx. Lanelle Bal states she will call the pt to inform him that the Rx is ready for pick up. No further action required from our office at this time.

## 2020-04-19 NOTE — Telephone Encounter (Signed)
Pt says his RX is incorrectly written - Pantoprazole and now he is out of medication, please call to advise

## 2020-04-29 ENCOUNTER — Ambulatory Visit: Payer: PPO | Admitting: Gastroenterology

## 2020-05-13 ENCOUNTER — Ambulatory Visit: Payer: PPO | Admitting: Nurse Practitioner

## 2020-05-19 ENCOUNTER — Ambulatory Visit: Payer: PPO | Admitting: Gastroenterology

## 2020-05-20 NOTE — Progress Notes (Signed)
Cardiology Office Note:   Date:  05/21/2020  NAME:  Peter Marsh    MRN: [id number] DOB:  [birth year]   PCP:  Vevelyn Francois, NP  Cardiologist:  Sanda Klein, MD  Electrophysiologist:  Cristopher Peru, MD   Referring MD: Vevelyn Francois, NP   Chief Complaint  Patient presents with  . Follow-up        History of Present Illness:   Peter Marsh is a 60 y.o. male with a hx of HTH, CKD IV, Aflutter (no AC due to GI bleed), conduction disease s/p ppm, HDL who presents for follow-up.  He reports roughly 5 days ago he had an episode of chest pain.  Described as pressure.  He reports he was standing in the kitchen.  Associated symptoms include shortness of breath.  The pain did not go into his arm or into his jaw.  The pain lasted roughly 3 to 4 minutes and resolved.  He reports this alarmed him.  He had to sit down.  He had no further episodes.  The pain was not associated with exertion or alleviated by rest.  It was too quick to notice any difference.  He had a nuclear medicine stress test in 2017 through [facility] which was normal.  His EKG today demonstrates an atrial paced rhythm with left bundle branch block.  No recent medication changes.  He is on fenofibrate for high triglycerides.  Most recent LDL cholesterol 98.  He cannot tolerate statins.  We discussed Zetia.  He is interested.  He reports no other medication changes.  Blood pressure is 119/73.  EKG unchanged from prior to my review.  Weights are stable.  Kidney function appears to be stable as well.  Problem List 1. Schizophrenia  2. HTN 3. CKD IV -Creatinine 2.38 4. Atrial flutter -no AC due to GI bleed   5. High grade conduction (HV ~70 msec) disease s/p dual chamber ppm (medtronic) 6. HLD -T chol 154, HDL 25, LDL 98, triglycerides 176 7. Right ureter narrowing  8. COPD  Past Medical History: Past Medical History:  Diagnosis Date  . Allergy   . Anxiety   . Asthma    uses inhalers   .  Bilateral carotid bruits   . Cardiac conduction disorder 2018   s/p MDT PPM  . Cataract   . Chronic kidney disease    bladder interstial cystitis   . Chronic kidney disease (CKD), stage IV (severe) ([name])    followed by Dr. Joelyn Oms at [facility]  . COPD (chronic obstructive pulmonary disease) ([place])   . Depression   . Encounter for care of pacemaker 02/13/2019  . GERD (gastroesophageal reflux disease)   . History of stomach ulcers 2001  . Hypertension   . LBBB (left bundle branch block)   . Lower extremity edema   . Mild intermittent asthma without complication   . Mixed hyperlipidemia   . Mobitz type 2 second degree AV block 04/06/2019  . Neuropathy   . Pacemaker: Medtronic Azure XT DR MRI [id number] PPM -  BUNDLE OF HIS pacing  02/26/2017   Scheduled Remote pacemaker check  11/12/2018:  There were 24 Fast AV episodes:  EGMs show SVTs. Episodes lasted < 2 minutes. Health trends do not demonstrate significant abnormality. Battery longevity is 9.4 - 10.3 years. RA pacing is 47.1 %, RV pacing is 40.4 %.  Clinic check 11/07/17.   . Paroxysmal atrial flutter ([place])   . PONV (postoperative nausea and vomiting)   .  Prostatitis   . Schizophrenia ([place])   . Sinus node dysfunction ([place]) 02/13/2019    Past Surgical History: Past Surgical History:  Procedure Laterality Date  . APPENDECTOMY    . COLONOSCOPY    . ELECTROPHYSIOLOGY STUDY N/A 02/26/2017   Procedure: ELECTROPHYSIOLOGY STUDY;  Surgeon: Evans Lance, MD;  Location: [facility];  Service: Cardiovascular;  Laterality: N/A;  . ESOPHAGOGASTRODUODENOSCOPY (EGD) WITH PROPOFOL N/A 09/18/2016   Procedure: ESOPHAGOGASTRODUODENOSCOPY (EGD) WITH PROPOFOL;  Surgeon: Ronnette Juniper, MD;  Location: [facility];  Service: Gastroenterology;  Laterality: N/A;  . LUMBAR LAMINECTOMY/DECOMPRESSION MICRODISCECTOMY  04/19/2011   Procedure: LUMBAR LAMINECTOMY/DECOMPRESSION MICRODISCECTOMY;  Surgeon: Tobi Bastos;  Location: [facility];  Service:  Orthopedics;  Laterality: Left;  Hemi LAminectomy/Microdiscectomy Lumbar four  - Lumbar five  on the Left (X-Ray)  . PACEMAKER IMPLANT N/A 02/26/2017   Procedure: PACEMAKER IMPLANT;  Surgeon: Evans Lance, MD;  Location: [facility];  Service: Cardiovascular;  Laterality: N/A;  . TONSILLECTOMY    . UPPER GASTROINTESTINAL ENDOSCOPY      Current Medications: Current Meds  Medication Sig  . acetaminophen (TYLENOL) 325 MG tablet Take 650 mg by mouth every 6 (six) hours as needed for mild pain or headache.  . albuterol (VENTOLIN HFA) 108 (90 Base) MCG/ACT inhaler Inhale 2 puffs into the lungs every 6 (six) hours as needed for wheezing or shortness of breath.  . benazepril (LOTENSIN) 10 MG tablet Take 1 tablet (10 mg total) by mouth daily.  . benazepril (LOTENSIN) 20 MG tablet Take 1 tablet by mouth 2 (two) times daily.  . budesonide-formoterol (SYMBICORT) 160-4.5 MCG/ACT inhaler Inhale 2 puffs into the lungs 2 (two) times daily.  [name] buPROPion (WELLBUTRIN SR) 150 MG 12 hr tablet Take 150 mg by mouth 2 (two) times daily.  . Cyanocobalamin (VITAMIN B-12 PO) Take 1 tablet by mouth daily.  [name] ezetimibe (ZETIA) 10 MG tablet Take 1 tablet (10 mg total) by mouth daily.  . Fenofibrate 50 MG CAPS Take 1 capsule (50 mg total) by mouth daily.  . fluticasone (FLONASE) 50 MCG/ACT nasal spray Place 1 spray into both nostrils daily as needed for allergies.  [name] gabapentin (NEURONTIN) 100 MG capsule Take 1 capsule (100 mg total) by mouth 3 (three) times daily.  . hydrALAZINE (APRESOLINE) 50 MG tablet Take 1 tablet (50 mg total) by mouth 3 (three) times daily.  . isosorbide dinitrate (ISORDIL) 30 MG tablet TAKE 1 TABLET(30 MG) BY MOUTH FOUR TIMES DAILY  . metoprolol tartrate (LOPRESSOR) 100 MG tablet Take 1 tablet (100 mg total) by mouth 2 (two) times daily.  . montelukast (SINGULAIR) 10 MG tablet Take 1 tablet (10 mg total) by mouth at bedtime.  . ondansetron (ZOFRAN ODT) 4 MG disintegrating tablet Take 1  tablet (4 mg total) by mouth every 8 (eight) hours as needed for nausea or vomiting.  . paliperidone (INVEGA) 6 MG 24 hr tablet Take 6 mg by mouth at bedtime.  . Pancrelipase, Lip-Prot-Amyl, (ZENPEP) 40000-126000 units CPEP Take 1 tablet by mouth with breakfast, with lunch, and with evening meal.  . pantoprazole (PROTONIX) 40 MG tablet Take 1 tablet (40 mg total) by mouth 2 (two) times daily.  . sucralfate (CARAFATE) 1 GM/10ML suspension Take 10 mLs (1 g total) by mouth 4 (four) times daily -  with meals and at bedtime.  . tamsulosin (FLOMAX) 0.4 MG CAPS capsule Take 1 capsule (0.4 mg total) by mouth daily.  [name] torsemide (DEMADEX) 10 MG tablet Take 1 tablet (  10 mg total) by mouth daily.     Allergies:    Methylpyrrolidone, Niacin, Norvasc [amlodipine besylate], Oxybutynin chloride [oxybutynin chloride er], Vesicare [solifenacin succinate], Ciprofloxacin, Oxybutynin, and Solifenacin   Social History: Social History   Socioeconomic History  . Marital status: Married    Spouse name: Not on file  . Number of children: 1  . Years of education: 34  . Highest education level: High school graduate  Occupational History  . Occupation: Retired  Tobacco Use  . Smoking status: Former Smoker    Packs/day: 1.00    Years: 30.00    Pack years: 30.00    Quit date: 05/08/2008    Years since quitting: 12.0  . Smokeless tobacco: Never Used  Vaping Use  . Vaping Use: Never used  Substance and Sexual Activity  . Alcohol use: No  . Drug use: No  . Sexual activity: Never  Other Topics Concern  . Not on file  Social History Narrative   Lives at home with wife.   Right-handed.   One cup caffeine per day.   Social Determinants of Health   Financial Resource Strain: Not on file  Food Insecurity: Not on file  Transportation Needs: Not on file  Physical Activity: Not on file  Stress: Not on file  Social Connections: Not on file     Family History: The patient's family history includes  Alzheimer's disease in his father; High blood pressure in his mother. There is no history of Colon cancer, Esophageal cancer, Rectal cancer, or Stomach cancer.  ROS:   All other ROS reviewed and negative. Pertinent positives noted in the HPI.     EKGs/Labs/Other Studies Reviewed:   The following studies were personally reviewed by me today:  EKG:  EKG is ordered today.  The ekg ordered today demonstrates A paced rhythm with left bundle branch block, and was personally reviewed by me.   Recent Labs: 09/08/2019: TSH 1.140 12/08/2019: B Natriuretic Peptide 100.4 03/16/2020: ALT 11; BUN 19; Creatinine, Ser 2.33; Hemoglobin 14.1; Platelets 259; Potassium 4.4; Sodium 136   Recent Lipid Panel    Component Value Date/Time   CHOL 154 02/11/2020 1213   TRIG 176 (H) 02/11/2020 1213   HDL 25 (L) 02/11/2020 1213   CHOLHDL 6.2 (H) 02/11/2020 1213   CHOLHDL 6.8 11/20/2018 0432   VLDL 19 11/20/2018 0432   LDLCALC 98 02/11/2020 1213    Physical Exam:   VS:  BP 119/73   Pulse 63   Ht 6\' 1"  (1.854 m)   Wt 242 lb 3.2 oz (109.9 kg)   SpO2 100%   BMI 31.95 kg/m    Wt Readings from Last 3 Encounters:  05/21/20 242 lb 3.2 oz (109.9 kg)  04/07/20 236 lb (107 kg)  03/23/20 236 lb (107 kg)    General: Well nourished, well developed, in no acute distress Head: Atraumatic, normal size  Eyes: PEERLA, EOMI  Neck: Supple, no JVD Endocrine: No thryomegaly Cardiac: Normal S1, S2; RRR; no murmurs, rubs, or gallops Lungs: Clear to auscultation bilaterally, no wheezing, rhonchi or rales  Abd: Soft, nontender, no hepatomegaly  Ext: No edema, pulses 2+ Musculoskeletal: No deformities, BUE and BLE strength normal and equal Skin: Warm and dry, no rashes   Neuro: Alert and oriented to person, place, time, and situation, CNII-XII grossly intact, no focal deficits  Psych: Normal mood and affect   ASSESSMENT:   Peter Marsh is a 60 y.o. male who presents for the following: 1. Chest pain of  uncertain  etiology   2. Sinus node dysfunction ([name])   3. Pacemaker   4. Mixed hyperlipidemia   5. Essential hypertension     PLAN:   1. Chest pain of uncertain etiology -One-time episode of chest pressure while standing.  Lasted several minutes.  EKG today demonstrates an a paced rhythm with a left bundle branch block.  This is unchanged from prior on my review.  No further symptoms.  Significant CVD risk factors include hyperlipidemia, hypertension, CKD.  He had a nuclear medicine stress test in 2017 that was normal.  I recommend we repeat a stress test just to make sure everything is okay.  Given his paced rhythm and left bundle branch block he may need a Lexi nuclear medicine stress test.  No evidence of heart failure examination.  2. Sinus node dysfunction ([name]) 3. Pacemaker -Recent pacemaker interrogation demonstrates he is a paced 87% of the time.  He is a chronic left bundle branch block.  He is BKA 0.7% of the time.  4. Mixed hyperlipidemia -Most recent LDL cholesterol 98.  He is statin intolerant.  He will try Zetia 10 mg a day.  5. Essential hypertension -Stable.  No change in medications.  Disposition: Return in about 6 months (around 11/18/2020).   Shared Decision Making/Informed Consent The risks [chest pain, shortness of breath, cardiac arrhythmias, dizziness, blood pressure fluctuations, myocardial infarction, stroke/transient ischemic attack, nausea, vomiting, allergic reaction, radiation exposure, metallic taste sensation and life-threatening complications (estimated to be 1 in 10,000)], benefits (risk stratification, diagnosing coronary artery disease, treatment guidance) and alternatives of a nuclear stress test were discussed in detail with Mr. Wieck and he agrees to proceed.  Medication Adjustments/Labs and Tests Ordered: Current medicines are reviewed at length with the patient today.  Concerns regarding medicines are outlined above.  Orders Placed This Encounter  Procedures   . MYOCARDIAL PERFUSION IMAGING   Meds ordered this encounter  Medications  . ezetimibe (ZETIA) 10 MG tablet    Sig: Take 1 tablet (10 mg total) by mouth daily.    Dispense:  90 tablet    Refill:  3    Patient Instructions  Medication Instructions:  Start Zetia 10 mg daily   *If you need a refill on your cardiac medications before your next appointment, please call your pharmacy*  Testing/Procedures: Your physician has requested that you have a lexiscan myoview. A cardiac stress test is a cardiological test that measures the heart's ability to respond to external stress in a controlled clinical environment. The stress response is induced by intravenous pharmacological stimulation.   Follow-Up: At [facility], you and your health needs are our priority.  As part of our continuing mission to provide you with exceptional heart care, we have created designated Provider Care Teams.  These Care Teams include your primary Cardiologist (physician) and Advanced Practice Providers (APPs -  Physician Assistants and Nurse Practitioners) who all work together to provide you with the care you need, when you need it.  We recommend signing up for the patient portal called "MyChart".  Sign up information is provided on this After Visit Summary.  MyChart is used to connect with patients for Virtual Visits (Telemedicine).  Patients are able to view lab/test results, encounter notes, upcoming appointments, etc.  Non-urgent messages can be sent to your provider as well.   To learn more about what you can do with MyChart, go to [email or web address].    Your next appointment:   6 month(s)  The format for  your next appointment:   In Person  Provider:   Eleonore Chiquito, MD       Time Spent with Patient: I have spent a total of 35 minutes with patient reviewing hospital notes, telemetry, EKGs, labs and examining the patient as well as establishing an assessment and plan that was discussed with the  patient.  > 50% of time was spent in direct patient care.  Signed, Addison Naegeli. Audie Box, [place]  [address] [place], [place] [address] [phone]  05/21/2020 9:48 AM

## 2020-05-21 ENCOUNTER — Encounter: Payer: Self-pay | Admitting: Cardiovascular Disease

## 2020-05-21 ENCOUNTER — Ambulatory Visit (INDEPENDENT_AMBULATORY_CARE_PROVIDER_SITE_OTHER): Payer: PPO | Admitting: Cardiovascular Disease

## 2020-05-21 ENCOUNTER — Other Ambulatory Visit: Payer: Self-pay

## 2020-05-21 VITALS — BP 119/73 | HR 63 | Ht 73.0 in | Wt 242.2 lb

## 2020-05-21 DIAGNOSIS — I495 Sick sinus syndrome: Secondary | ICD-10-CM

## 2020-05-21 DIAGNOSIS — I10 Essential (primary) hypertension: Secondary | ICD-10-CM

## 2020-05-21 DIAGNOSIS — R079 Chest pain, unspecified: Secondary | ICD-10-CM | POA: Diagnosis not present

## 2020-05-21 DIAGNOSIS — E782 Mixed hyperlipidemia: Secondary | ICD-10-CM | POA: Diagnosis not present

## 2020-05-21 DIAGNOSIS — Z950 Presence of cardiac pacemaker: Secondary | ICD-10-CM | POA: Diagnosis not present

## 2020-05-21 MED ORDER — EZETIMIBE 10 MG PO TABS: 10.0000 mg | ORAL_TABLET | Freq: Every day | ORAL | 3 refills | Status: DC

## 2020-05-21 NOTE — Patient Instructions (Signed)
Medication Instructions:  Start Zetia 10 mg daily   *If you need a refill on your cardiac medications before your next appointment, please call your pharmacy*  Testing/Procedures: Your physician has requested that you have a lexiscan myoview. A cardiac stress test is a cardiological test that measures the heart's ability to respond to external stress in a controlled clinical environment. The stress response is induced by intravenous pharmacological stimulation.   Follow-Up: At [facility], you and your health needs are our priority.  As part of our continuing mission to provide you with exceptional heart care, we have created designated Provider Care Teams.  These Care Teams include your primary Cardiologist (physician) and Advanced Practice Providers (APPs -  Physician Assistants and Nurse Practitioners) who all work together to provide you with the care you need, when you need it.  We recommend signing up for the patient portal called "MyChart".  Sign up information is provided on this After Visit Summary.  MyChart is used to connect with patients for Virtual Visits (Telemedicine).  Patients are able to view lab/test results, encounter notes, upcoming appointments, etc.  Non-urgent messages can be sent to your provider as well.   To learn more about what you can do with MyChart, go to [email or web address].    Your next appointment:   6 month(s)  The format for your next appointment:   In Person  Provider:   Eleonore Chiquito, MD

## 2020-05-21 NOTE — Addendum Note (Signed)
Addended by: Wonda Horner on: 05/21/2020 04:15 PM   Modules accepted: Orders

## 2020-05-25 ENCOUNTER — Telehealth (HOSPITAL_COMMUNITY): Payer: Self-pay | Admitting: Cardiovascular Disease

## 2020-05-25 NOTE — Telephone Encounter (Signed)
Patient cancelled Myoview for the reason below and order will be removed from the WQ.:  Peter Marsh, Peter Marsh     Cancel Rsn: Patient (states he does not feel well / does not feel stress test is necessary at this time)

## 2020-05-27 ENCOUNTER — Inpatient Hospital Stay (HOSPITAL_COMMUNITY): Admission: RE | Admit: 2020-05-27 | Payer: PPO | Source: Ambulatory Visit

## 2020-06-02 ENCOUNTER — Encounter: Payer: Self-pay | Admitting: Gastroenterology

## 2020-06-02 ENCOUNTER — Other Ambulatory Visit: Payer: Self-pay

## 2020-06-02 ENCOUNTER — Ambulatory Visit (INDEPENDENT_AMBULATORY_CARE_PROVIDER_SITE_OTHER): Payer: PPO | Admitting: Gastroenterology

## 2020-06-02 VITALS — BP 158/100 | HR 73 | Ht 73.0 in | Wt 236.0 lb

## 2020-06-02 DIAGNOSIS — Z86010 Personal history of colonic polyps: Secondary | ICD-10-CM

## 2020-06-02 DIAGNOSIS — K2270 Barrett's esophagus without dysplasia: Secondary | ICD-10-CM | POA: Diagnosis not present

## 2020-06-02 DIAGNOSIS — K861 Other chronic pancreatitis: Secondary | ICD-10-CM | POA: Diagnosis not present

## 2020-06-02 MED ORDER — ONDANSETRON HCL 4 MG PO TABS: ORAL_TABLET | ORAL | 0 refills | Status: DC

## 2020-06-02 NOTE — Progress Notes (Signed)
Referring Provider: Vevelyn Francois, NP Primary Care Physician:  Vevelyn Francois, NP  Chief complaint: Chronic pancreatitis, Barrett's esophagus    IMPRESSION:  Ongoing abdominal pain Chronic pancreatitis with associated abdominal pain    - improved on pancreatic enzymes Long-segment Barrett's Esophagus with esophageal ulcerations seen on EGD 2018    - no dysplasia on EGD 04/07/20 Intestinal metaplasia on gastric biopsies 04/07/20    - no family history of gastric cancer Chronic constipation with associated internal hemorrhoids History of colon polyps    - colonoscopy with Dr. Benson Norway 03/02/11: 3 tubular adenomas and a hyperplastic polyp  Abdominal pain: Pain has resolved with resumption of Creon.   Chronic pancreatitis: Continue Creon.   Barrett's Esophagus: No dysplasia noted on biopsies 12/21. Continue pantoprazole and Carafate.   Gastritis with intestinal metaplasia on 12/21 EGD: No family history of gastric cancer.  Biopsies negative for H pylori. Will plan mapping with next EGD.   Intermittent rectal bleeding that the patient attributes to hemorrhoids: Resolved with normalization of bowel habits on pancreatic enzymes.  History of colon polyps: Surveillance due. He is concerned about prep-related nausea. Will plan Zofran SL prior to each dose of purgative. I've asked him to call the on-call physician with any difficulties to be sure that he has an adequate preparation for the procedure.    PLAN: - Diet recommendation: consume low-fat meals, eat multiple small meals, and avoid dehydration - Continue Creon, no dose changes today - Continue sucralfate and pantoprazole - Surveillance EGD for Barrett's 04/2023, will perform gastric mapping at that time - Surveillance Colonoscopy given history of polyps - will use Zofran ODT 4mg  taken prior to each dose of purgative - Office follow-up in 6 months, earlier as needed  Please see the "Patient Instructions" section for addition  details about the plan.  HPI: EGOR FULLILOVE is a 60 y.o. male who returns in follow-up after his initial consultation 03/23/2020 and upper endoscopy 04/07/2020.  The interval history is obtained through the patient and review of his electronic health record.    I have also reviewed her prior colonoscopy report from Dr. Benson Norway 03/02/2011 showing a 1 cm transverse colon polyp, 2 6 to 7 mm sessile rectal polyps.  Pathology revealed 3 tubular adenomas and a hyperplastic polyp.  Repeat colonoscopy recommended in 5 years.    His chronic symptoms of chronic pancreatitis, which are largely nausea, vomiting, and abdominal pain, have improved since she resumed Creon. No steatorrhea, maldigestion, on known history of pancreatic pseudocyst, bile duct or duodenal obstruction.  Now having a soft, formed bowel movement daily. Constipation and bleeding has resolved. has resolved.   Endoscopic history: - EGD/Colon around 2013 Benson Norway): ? Polyps. Did not tolerate the bowel prep.  - EGD 09/18/16 Therisa Doyne) for epigastric pain: antral inflammation; Barrett's esophagus with associated exudate/ulcer with the ulcer measuring 19mm - EGD 04/07/20: 4cm Barrett's esophagus without dysphagia, gastritis and gastric intestinal metaplasia, normal duodenal biopsies  Prior abdominal imaging: - Gastric emptying study 2010: 30% retention at 2 hours - CT abd/pelvis without contrast 2015: Tiny calcifications throughout pancreas consistent with chronic  calcific pancreatitis. - CT abd/pelvis without contrast 2016: Diffuse speckled calcification within the pancreatic parenchyma is compatible with chronic pancreatitis. No dilatation of the main pancreatic duct. Kidney cysts - RUQ ultrasound 2018: normal gallbladder, CBD, liver, large cystic mass in the right kidney - Abdominal MRI to follow-up on renal lesions 2020: stomach and bowel mentioned as normal, abnormal kidneys  Father required gastric surgery for  ulcers but did not have gastric  cancer. No known family history of colon cancer or polyps. No family history of uterine/endometrial cancer, pancreatic cancer or gastric/stomach cancer.   Past Medical History:  Diagnosis Date  . Allergy   . Anxiety   . Asthma    uses inhalers   . Bilateral carotid bruits   . Cardiac conduction disorder 2018   s/p MDT PPM  . Cataract   . Chronic kidney disease    bladder interstial cystitis   . Chronic kidney disease (CKD), stage IV (severe) ([name])    [facility]  . COPD (chronic obstructive pulmonary disease) ([place])   . Depression   . Encounter for care of pacemaker 02/13/2019  . GERD (gastroesophageal reflux disease)   . History of stomach ulcers 2001  . Hypertension   . LBBB (left bundle branch block)   . Lower extremity edema   . Mild intermittent asthma without complication   . Mixed hyperlipidemia   . Mobitz type 2 second degree AV block 04/06/2019  . Neuropathy   . Pacemaker: Medtronic Azure XT DR MRI [id number] PPM -  BUNDLE OF HIS pacing  02/26/2017   Scheduled Remote pacemaker check  11/12/2018:  There were 24 Fast AV episodes:  EGMs show SVTs. Episodes lasted < 2 minutes. Health trends do not demonstrate significant abnormality. Battery longevity is 9.4 - 10.3 years. RA pacing is 47.1 %, RV pacing is 40.4 %.  Clinic check 11/07/17.   . Paroxysmal atrial flutter ([place])   . PONV (postoperative nausea and vomiting)   . Prostatitis   . Schizophrenia ([place])   . Sinus node dysfunction ([place]) 02/13/2019    Past Surgical History:  Procedure Laterality Date  . APPENDECTOMY    . COLONOSCOPY    . ELECTROPHYSIOLOGY STUDY N/A 02/26/2017   Procedure: ELECTROPHYSIOLOGY STUDY;  Surgeon: Evans Lance, MD;  Location: [place] [facility];  Service: Cardiovascular;  Laterality: N/A;  . ESOPHAGOGASTRODUODENOSCOPY (EGD) WITH PROPOFOL N/A 09/18/2016   Procedure: ESOPHAGOGASTRODUODENOSCOPY (EGD) WITH PROPOFOL;  Surgeon: Ronnette Juniper, MD;  Location: [place];   Service: Gastroenterology;  Laterality: N/A;  . LUMBAR LAMINECTOMY/DECOMPRESSION MICRODISCECTOMY  04/19/2011   Procedure: LUMBAR LAMINECTOMY/DECOMPRESSION MICRODISCECTOMY;  Surgeon: Tobi Bastos;  Location: [facility];  Service: Orthopedics;  Laterality: Left;  Hemi LAminectomy/Microdiscectomy Lumbar four  - Lumbar five  on the Left (X-Ray)  . PACEMAKER IMPLANT N/A 02/26/2017   Procedure: PACEMAKER IMPLANT;  Surgeon: Evans Lance, MD;  Location: [facility];  Service: Cardiovascular;  Laterality: N/A;  . TONSILLECTOMY    . UPPER GASTROINTESTINAL ENDOSCOPY      Current Outpatient Medications  Medication Sig Dispense Refill  . acetaminophen (TYLENOL) 325 MG tablet Take 650 mg by mouth every 6 (six) hours as needed for mild pain or headache.    . albuterol (VENTOLIN HFA) 108 (90 Base) MCG/ACT inhaler Inhale 2 puffs into the lungs every 6 (six) hours as needed for wheezing or shortness of breath. 8 g 11  . benazepril (LOTENSIN) 10 MG tablet Take 1 tablet (10 mg total) by mouth daily. 90 tablet 1  . benazepril (LOTENSIN) 20 MG tablet Take 1 tablet by mouth 2 (two) times daily.    . budesonide-formoterol (SYMBICORT) 160-4.5 MCG/ACT inhaler Inhale 2 puffs into the lungs 2 (two) times daily. 1 each 2  . buPROPion (WELLBUTRIN SR) 150 MG 12 hr tablet Take 150 mg by mouth 2 (two) times daily.    . cloNIDine (CATAPRES)  0.2 MG tablet Take 1 tablet (0.2 mg total) by mouth 2 (two) times daily. 60 tablet 2  . Cyanocobalamin (VITAMIN B-12 PO) Take 1 tablet by mouth daily.    [name] ezetimibe (ZETIA) 10 MG tablet Take 1 tablet (10 mg total) by mouth daily. 90 tablet 3  . Fenofibrate 50 MG CAPS Take 1 capsule (50 mg total) by mouth daily. 30 capsule 11  . fluticasone (FLONASE) 50 MCG/ACT nasal spray Place 1 spray into both nostrils daily as needed for allergies. 11.1 mL 11  . gabapentin (NEURONTIN) 100 MG capsule Take 1 capsule (100 mg total) by mouth 3 (three) times daily. 90 capsule 11  . hydrALAZINE  (APRESOLINE) 50 MG tablet Take 1 tablet (50 mg total) by mouth 3 (three) times daily. 270 tablet 1  . isosorbide dinitrate (ISORDIL) 30 MG tablet TAKE 1 TABLET(30 MG) BY MOUTH FOUR TIMES DAILY 360 tablet 1  . metoprolol tartrate (LOPRESSOR) 100 MG tablet Take 1 tablet (100 mg total) by mouth 2 (two) times daily. 180 tablet 1  . montelukast (SINGULAIR) 10 MG tablet Take 1 tablet (10 mg total) by mouth at bedtime. 90 tablet 3  . ondansetron (ZOFRAN ODT) 4 MG disintegrating tablet Take 1 tablet (4 mg total) by mouth every 8 (eight) hours as needed for nausea or vomiting. 20 tablet 0  . paliperidone (INVEGA) 6 MG 24 hr tablet Take 6 mg by mouth at bedtime.    . Pancrelipase, Lip-Prot-Amyl, (ZENPEP) 40000-126000 units CPEP Take 1 tablet by mouth with breakfast, with lunch, and with evening meal. 90 capsule 2  . pantoprazole (PROTONIX) 40 MG tablet Take 1 tablet (40 mg total) by mouth 2 (two) times daily. 180 tablet 3  . sucralfate (CARAFATE) 1 GM/10ML suspension Take 10 mLs (1 g total) by mouth 4 (four) times daily -  with meals and at bedtime. (Patient taking differently: Take 1 g by mouth 3 (three) times daily as needed.) 3600 mL 3  . tamsulosin (FLOMAX) 0.4 MG CAPS capsule Take 1 capsule (0.4 mg total) by mouth daily. 90 capsule 2  . torsemide (DEMADEX) 10 MG tablet Take 1 tablet (10 mg total) by mouth daily. 90 tablet 2   No current facility-administered medications for this visit.   Facility-Administered Medications Ordered in Other Visits  Medication Dose Route Frequency Provider Last Rate Last Admin  . acetaminophen (OFIRMEV) IVPB    PRN Lissa Morales, CRNA   1,000 mg at 04/19/11 0910  . ePHEDrine injection    PRN Lissa Morales, CRNA   5 mg at 04/19/11 1018  . glycopyrrolate (ROBINUL) injection    PRN Lissa Morales, CRNA   0.8 mg at 04/19/11 1037  . SUFentanil (SUFENTA) injection    PRN Lissa Morales, CRNA   10 mcg at 04/19/11 1008    Allergies as of 06/02/2020 - Review Complete  06/02/2020  Allergen Reaction Noted  . Methylpyrrolidone Hives 07/21/2014  . Niacin Other (See Comments) and Nausea And Vomiting 08/30/2012  . Norvasc [amlodipine besylate] Other (See Comments) 10/15/2017  . Oxybutynin chloride [oxybutynin chloride er] Other (See Comments) 04/13/2011  . Vesicare [solifenacin succinate] Other (See Comments) 04/13/2011  . Ciprofloxacin Rash and Other (See Comments) 10/26/2010  . Oxybutynin Rash 10/09/2011  . Solifenacin Rash 10/09/2011    Family History  Problem Relation Age of Onset  . High blood pressure Mother   . Alzheimer's disease Father   . Colon cancer Neg Hx   . Esophageal cancer Neg Hx   .  Rectal cancer Neg Hx   . Stomach cancer Neg Hx     Social History   Socioeconomic History  . Marital status: Married    Spouse name: Not on file  . Number of children: 1  . Years of education: 63  . Highest education level: High school graduate  Occupational History  . Occupation: Retired  Tobacco Use  . Smoking status: Former Smoker    Packs/day: 1.00    Years: 30.00    Pack years: 30.00    Quit date: 05/08/2008    Years since quitting: 12.0  . Smokeless tobacco: Never Used  Vaping Use  . Vaping Use: Never used  Substance and Sexual Activity  . Alcohol use: No  . Drug use: No  . Sexual activity: Never  Other Topics Concern  . Not on file  Social History Narrative   Lives at home with wife.   Right-handed.   One cup caffeine per day.   Social Determinants of Health   Financial Resource Strain: Not on file  Food Insecurity: Not on file  Transportation Needs: Not on file  Physical Activity: Not on file  Stress: Not on file  Social Connections: Not on file  Intimate Partner Violence: Not on file     Physical Exam: General:   Alert,  well-nourished, pleasant and cooperative in NAD Head:  Normocephalic and atraumatic. Eyes:  Sclera clear, no icterus.   Conjunctiva pink. Ears:  Normal auditory acuity. Nose:  No deformity,  discharge,  or lesions. Mouth:  No deformity or lesions.   Neck:  Supple; no masses or thyromegaly. Lungs:  Clear throughout to auscultation.   No wheezes. Heart:  Regular rate and rhythm; no murmurs. Abdomen:  Soft, central obesity, ventral hernia, nontender, nondistended, normal bowel sounds, no rebound or guarding. No hepatosplenomegaly.   Rectal:  Deferred  Msk:  Symmetrical. No boney deformities LAD: No inguinal or umbilical LAD Extremities:  No clubbing or edema. Neurologic:  Alert and  oriented x4;  grossly nonfocal Skin:  Intact without significant lesions or rashes. Psych:  Alert and cooperative. Normal mood and affect.     Rogelio Waynick L. Tarri Glenn, MD, MPH 06/02/2020, 10:05 AM

## 2020-06-02 NOTE — Patient Instructions (Addendum)
It is time for another colonoscopy.   COLONOSCOPY:   You have been scheduled for a colonoscopy. Please follow written instructions given to you at your visit today.   PREP:   Please pick up your prep supplies at the pharmacy within the next 1-3 days.  INHALERS:   If you use inhalers (even only as needed), please bring them with you on the day of your procedure.  PRESCRIPTION MEDICATION(S):   We have sent the following medication(s) to your pharmacy:  . Zofran - Please take 1 dose before each dose of your prep. If ineffective, may take an additional dose  Tips for colonoscopy:  - Stay well hydrated for 3-4 days prior to the exam. This reduces nausea and dehydration.  - To prevent skin/hemorrhoid irritation - prior to wiping, put A&Dointment or vaseline on the toilet paper. - Keep a towel or pad on the bed.  - Drink  64oz of clear liquids in the morning of prep day (prior to starting the prep) to be sure that there is enough fluid to flush the colon and stay hydrated!!!! This is in addition to the fluids required for preparation. - Use of a flavored hard candy, such as grape Anise Salvo, can counteract some of the flavor of the prep and may prevent some nausea.  - Use Zofran, placed under your tongue, before drinking each dose of the laxative to prevent nausea.   BMI:  If you are age 31 or younger, your body mass index should be between 19-25. Your There is no height or weight on file to calculate BMI. If this is out of the aformentioned range listed, please consider follow up with your Primary Care Provider.   Thank you for trusting me with your gastrointestinal care!    Thornton Park, MD, MPH

## 2020-06-11 ENCOUNTER — Ambulatory Visit: Payer: PPO | Admitting: Nurse Practitioner

## 2020-06-11 ENCOUNTER — Telehealth: Payer: Self-pay | Admitting: Internal Medicine

## 2020-06-11 MED ORDER — CLONIDINE HCL 0.2 MG PO TABS: 0.2000 mg | ORAL_TABLET | Freq: Two times a day (BID) | ORAL | 3 refills | Status: DC

## 2020-06-11 NOTE — Telephone Encounter (Signed)
*  STAT* If patient is at the pharmacy, call can be transferred to refill team.   1. Which medications need to be refilled? (please list name of each medication and dose if known)  cloNIDine (CATAPRES) 0.2 MG tablet [[id number]] ENDED   2. Which pharmacy/location (including street and city if local pharmacy) is medication to be sent to?  [facility]  [phone] 3. Do they need a 30 day or 90 day supply? [place]

## 2020-06-11 NOTE — Telephone Encounter (Signed)
Pt's medication was sent to pt's pharmacy as requested. Confirmation received.  °

## 2020-06-28 ENCOUNTER — Ambulatory Visit (INDEPENDENT_AMBULATORY_CARE_PROVIDER_SITE_OTHER): Payer: PPO

## 2020-06-28 DIAGNOSIS — I495 Sick sinus syndrome: Secondary | ICD-10-CM

## 2020-06-28 LAB — CUP PACEART REMOTE DEVICE CHECK
Battery Remaining Longevity: 93 mo
Battery Voltage: 3 V
Brady Statistic AP VP Percent: 0.21 %
Brady Statistic AP VS Percent: 97.37 %
Brady Statistic AS VP Percent: 0 %
Brady Statistic AS VS Percent: 2.43 %
Brady Statistic RA Percent Paced: 97.58 %
Brady Statistic RV Percent Paced: 0.21 %
Date Time Interrogation Session: 20220221043007
Implantable Lead Implant Date: 20181022
Implantable Lead Implant Date: 20181022
Implantable Lead Location: 753859
Implantable Lead Location: 753860
Implantable Lead Model: 3830
Implantable Lead Model: 5076
Implantable Pulse Generator Implant Date: 20181022
Lead Channel Impedance Value: 323 Ohm
Lead Channel Impedance Value: 342 Ohm
Lead Channel Impedance Value: 456 Ohm
Lead Channel Impedance Value: 475 Ohm
Lead Channel Pacing Threshold Amplitude: 0.75 V
Lead Channel Pacing Threshold Amplitude: 1.125 V
Lead Channel Pacing Threshold Pulse Width: 0.4 ms
Lead Channel Pacing Threshold Pulse Width: 0.4 ms
Lead Channel Sensing Intrinsic Amplitude: 3.25 mV
Lead Channel Sensing Intrinsic Amplitude: 3.25 mV
Lead Channel Sensing Intrinsic Amplitude: 9.875 mV
Lead Channel Sensing Intrinsic Amplitude: 9.875 mV
Lead Channel Setting Pacing Amplitude: 1.5 V
Lead Channel Setting Pacing Amplitude: 3.5 V
Lead Channel Setting Pacing Pulse Width: 1 ms
Lead Channel Setting Sensing Sensitivity: 1.2 mV

## 2020-07-01 NOTE — Progress Notes (Signed)
Remote pacemaker transmission.   

## 2020-07-05 DIAGNOSIS — E663 Overweight: Secondary | ICD-10-CM | POA: Diagnosis not present

## 2020-07-05 DIAGNOSIS — J309 Allergic rhinitis, unspecified: Secondary | ICD-10-CM | POA: Diagnosis not present

## 2020-07-05 DIAGNOSIS — E782 Mixed hyperlipidemia: Secondary | ICD-10-CM | POA: Diagnosis not present

## 2020-07-05 DIAGNOSIS — N3010 Interstitial cystitis (chronic) without hematuria: Secondary | ICD-10-CM | POA: Diagnosis not present

## 2020-07-05 DIAGNOSIS — I4891 Unspecified atrial fibrillation: Secondary | ICD-10-CM | POA: Diagnosis not present

## 2020-07-05 DIAGNOSIS — F331 Major depressive disorder, recurrent, moderate: Secondary | ICD-10-CM | POA: Diagnosis not present

## 2020-07-05 DIAGNOSIS — G629 Polyneuropathy, unspecified: Secondary | ICD-10-CM | POA: Diagnosis not present

## 2020-07-05 DIAGNOSIS — J449 Chronic obstructive pulmonary disease, unspecified: Secondary | ICD-10-CM | POA: Diagnosis not present

## 2020-07-05 DIAGNOSIS — N289 Disorder of kidney and ureter, unspecified: Secondary | ICD-10-CM | POA: Diagnosis not present

## 2020-07-05 DIAGNOSIS — I10 Essential (primary) hypertension: Secondary | ICD-10-CM | POA: Diagnosis not present

## 2020-07-05 DIAGNOSIS — I2510 Atherosclerotic heart disease of native coronary artery without angina pectoris: Secondary | ICD-10-CM | POA: Diagnosis not present

## 2020-07-05 DIAGNOSIS — J45909 Unspecified asthma, uncomplicated: Secondary | ICD-10-CM | POA: Diagnosis not present

## 2020-07-07 DIAGNOSIS — N3010 Interstitial cystitis (chronic) without hematuria: Secondary | ICD-10-CM | POA: Diagnosis not present

## 2020-07-07 DIAGNOSIS — F250 Schizoaffective disorder, bipolar type: Secondary | ICD-10-CM | POA: Diagnosis not present

## 2020-07-11 ENCOUNTER — Emergency Department (HOSPITAL_COMMUNITY): Payer: PPO

## 2020-07-11 ENCOUNTER — Emergency Department (HOSPITAL_COMMUNITY)
Admission: EM | Admit: 2020-07-11 | Discharge: 2020-07-11 | Disposition: A | Discharge status: Home / Self Care | Payer: PPO | Attending: Emergency Medicine | Admitting: Emergency Medicine

## 2020-07-11 ENCOUNTER — Encounter (HOSPITAL_COMMUNITY): Payer: Self-pay | Admitting: Emergency Medicine

## 2020-07-11 DIAGNOSIS — Z950 Presence of cardiac pacemaker: Secondary | ICD-10-CM | POA: Diagnosis not present

## 2020-07-11 DIAGNOSIS — R059 Cough, unspecified: Secondary | ICD-10-CM

## 2020-07-11 DIAGNOSIS — Z20822 Contact with and (suspected) exposure to covid-19: Secondary | ICD-10-CM | POA: Insufficient documentation

## 2020-07-11 DIAGNOSIS — R197 Diarrhea, unspecified: Secondary | ICD-10-CM | POA: Diagnosis not present

## 2020-07-11 DIAGNOSIS — J449 Chronic obstructive pulmonary disease, unspecified: Secondary | ICD-10-CM | POA: Insufficient documentation

## 2020-07-11 DIAGNOSIS — Z87891 Personal history of nicotine dependence: Secondary | ICD-10-CM | POA: Diagnosis not present

## 2020-07-11 DIAGNOSIS — R112 Nausea with vomiting, unspecified: Secondary | ICD-10-CM | POA: Diagnosis not present

## 2020-07-11 DIAGNOSIS — I10 Essential (primary) hypertension: Secondary | ICD-10-CM

## 2020-07-11 DIAGNOSIS — R109 Unspecified abdominal pain: Secondary | ICD-10-CM | POA: Diagnosis not present

## 2020-07-11 DIAGNOSIS — N184 Chronic kidney disease, stage 4 (severe): Secondary | ICD-10-CM | POA: Insufficient documentation

## 2020-07-11 DIAGNOSIS — J45909 Unspecified asthma, uncomplicated: Secondary | ICD-10-CM | POA: Insufficient documentation

## 2020-07-11 DIAGNOSIS — Z79899 Other long term (current) drug therapy: Secondary | ICD-10-CM | POA: Insufficient documentation

## 2020-07-11 DIAGNOSIS — Z7951 Long term (current) use of inhaled steroids: Secondary | ICD-10-CM | POA: Insufficient documentation

## 2020-07-11 DIAGNOSIS — B349 Viral infection, unspecified: Secondary | ICD-10-CM | POA: Diagnosis not present

## 2020-07-11 DIAGNOSIS — K219 Gastro-esophageal reflux disease without esophagitis: Secondary | ICD-10-CM | POA: Diagnosis not present

## 2020-07-11 DIAGNOSIS — I129 Hypertensive chronic kidney disease with stage 1 through stage 4 chronic kidney disease, or unspecified chronic kidney disease: Secondary | ICD-10-CM | POA: Diagnosis not present

## 2020-07-11 DIAGNOSIS — R509 Fever, unspecified: Secondary | ICD-10-CM

## 2020-07-11 LAB — URINALYSIS, ROUTINE W REFLEX MICROSCOPIC
Bacteria, UA: NONE SEEN
Bilirubin Urine: NEGATIVE
Glucose, UA: NEGATIVE mg/dL
Hgb urine dipstick: NEGATIVE
Ketones, ur: NEGATIVE mg/dL
Leukocytes,Ua: NEGATIVE
Nitrite: NEGATIVE
Protein, ur: 100 mg/dL — AB
Specific Gravity, Urine: 1.023 (ref 1.005–1.030)
pH: 6 (ref 5.0–8.0)

## 2020-07-11 LAB — COMPREHENSIVE METABOLIC PANEL
ALT: 13 U/L (ref 0–44)
AST: 21 U/L (ref 15–41)
Albumin: 3.9 g/dL (ref 3.5–5.0)
Alkaline Phosphatase: 59 U/L (ref 38–126)
Anion gap: 10 (ref 5–15)
BUN: 14 mg/dL (ref 8–23)
CO2: 25 mmol/L (ref 22–32)
Calcium: 9.5 mg/dL (ref 8.9–10.3)
Chloride: 105 mmol/L (ref 98–111)
Creatinine, Ser: 1.92 mg/dL — ABNORMAL HIGH (ref 0.61–1.24)
GFR, Estimated: 39 mL/min — ABNORMAL LOW (ref >60)
Glucose, Bld: 113 mg/dL — ABNORMAL HIGH (ref 70–99)
Potassium: 3.7 mmol/L (ref 3.5–5.1)
Sodium: 140 mmol/L (ref 135–145)
Total Bilirubin: 0.9 mg/dL (ref 0.3–1.2)
Total Protein: 7 g/dL (ref 6.5–8.1)

## 2020-07-11 LAB — CBC
HCT: 43.3 % (ref 39.0–52.0)
Hemoglobin: 14.4 g/dL (ref 13.0–17.0)
MCH: 29.2 pg (ref 26.0–34.0)
MCHC: 33.3 g/dL (ref 30.0–36.0)
MCV: 87.8 fL (ref 80.0–100.0)
Platelets: 217 10*3/uL (ref 150–400)
RBC: 4.93 MIL/uL (ref 4.22–5.81)
RDW: 13.2 % (ref 11.5–15.5)
WBC: 7.2 10*3/uL (ref 4.0–10.5)
nRBC: 0 % (ref 0.0–0.2)

## 2020-07-11 LAB — SARS CORONAVIRUS 2 (TAT 6-24 HRS): SARS Coronavirus 2: NEGATIVE

## 2020-07-11 LAB — LIPASE, BLOOD: Lipase: 26 U/L (ref 11–51)

## 2020-07-11 LAB — POC SARS CORONAVIRUS 2 AG -  ED: SARS Coronavirus 2 Ag: NEGATIVE

## 2020-07-11 LAB — TROPONIN I (HIGH SENSITIVITY): Troponin I (High Sensitivity): 10 ng/L (ref <18)

## 2020-07-11 MED ORDER — ONDANSETRON 4 MG PO TBDP: 4.0000 mg | ORAL_TABLET | Freq: Three times a day (TID) | ORAL | 0 refills | Status: DC | PRN

## 2020-07-11 MED ORDER — SUCRALFATE 1 GM/10ML PO SUSP
1.0000 g | Freq: Once | ORAL | Status: AC
  Administered 2020-07-11: 1 g via ORAL
  Filled 2020-07-11 (×2): qty 10

## 2020-07-11 MED ORDER — ONDANSETRON 4 MG PO TBDP: 4.0000 mg | ORAL_TABLET | Freq: Three times a day (TID) | ORAL | Status: DC | PRN

## 2020-07-11 MED ORDER — METOPROLOL TARTRATE 25 MG PO TABS
100.0000 mg | ORAL_TABLET | Freq: Two times a day (BID) | ORAL | Status: DC
  Filled 2020-07-11: qty 4

## 2020-07-11 MED ORDER — PANTOPRAZOLE SODIUM 40 MG PO TBEC
40.0000 mg | DELAYED_RELEASE_TABLET | Freq: Two times a day (BID) | ORAL | Status: DC
  Administered 2020-07-11: 40 mg via ORAL
  Filled 2020-07-11: qty 1

## 2020-07-11 MED ORDER — HYDRALAZINE HCL 25 MG PO TABS
50.0000 mg | ORAL_TABLET | Freq: Once | ORAL | Status: AC
  Administered 2020-07-11: 50 mg via ORAL
  Filled 2020-07-11: qty 2

## 2020-07-11 MED ORDER — CLONIDINE HCL 0.2 MG PO TABS
0.2000 mg | ORAL_TABLET | Freq: Once | ORAL | Status: AC
  Administered 2020-07-11: 0.2 mg via ORAL
  Filled 2020-07-11: qty 1

## 2020-07-11 NOTE — ED Provider Notes (Signed)
[facility] EMERGENCY DEPARTMENT Provider Note   CSN: [id number] Arrival date & time: 07/11/20  0536     History Chief Complaint  Patient presents with  . Abdominal Pain    Peter Marsh is a 60 y.o. male presents to the ED for "I think I have the virus". Reports for the last 2 days he has had a runny nose, nonproductive cough, fever as high as 103, nausea, vomiting, abdominal pain, diarrhea, chest pain and shortness of breath. States he has vomited 4-5 times so far this morning and had 4-5 loose BMs. Has had 2 vaccines for COVID with a booster. No sick contacts. No recent travel. He took Tylenol prior to arrival. Denies any active chest pain. Over the last couple days his chest pain has been substernal like a squeeze, comes and goes. He can't tell me any aggravating or alleviating factors. No radiation. Has shortness of breath with it. No pleuritic component. No radiation of pain into the neck or arm or back. Chart shows patient has history of sinus node dysfunction s/p Medtronic pacemaker, hypertension, hyperlipidemia. Seen recently for episode of chest pain by Dr. Leonides Schanz who ordered a Lexi nuclear medicine stress test which patient has not had yet. Patient also reports chronic issues with pancreatitis and abdominal pain, nausea, vomiting and diarrhea. Seen recently by GI for these issues. He is on Creon comments Carafate, Protonix. Reports being compliant with these medicines. He denies any active chest pain or shortness of breath. No hematemesis, coffee-ground emesis, blood in his stool. Reports increased urination but no dysuria.  HPI     Past Medical History:  Diagnosis Date  . Allergy   . Anxiety   . Asthma    uses inhalers   . Bilateral carotid bruits   . Cardiac conduction disorder 2018   s/p MDT PPM  . Cataract   . Chronic kidney disease    bladder interstial cystitis   . Chronic kidney disease (CKD), stage IV (severe) ([name])    follow[facility] by Dr. Joelyn [facility]ms at  [facility]  . COPD (chronic obstructive pulmonary disease) ([place])   . Depression   . Encounter for care of pacemaker 02/13/2019  . GERD (gastroesophageal reflux disease)   . History of stomach ulcers 2001  . Hypertension   . LBBB (left bundle branch block)   . Lower extremity edema   . Mild intermittent asthma without complication   . Mixed hyperlipidemia   . Mobitz type 2 second degree AV block 04/06/2019  . Neuropathy   . Pacemaker: Medtronic Azure XT DR MRI [id number] PPM -  BUNDLE OF HIS pacing  02/26/2017   Scheduled Remote pacemaker check  11/12/2018:  There were 24 Fast AV episodes:  EGMs show SVTs. Episodes lasted < 2 minutes. Health trends do not demonstrate significant abnormality. Battery longevity is 9.4 - 10.3 years. RA pacing is 47.1 %, RV pacing is 40.4 %.  Clinic check 11/07/17.   . Paroxysmal atrial flutter ([place])   . PONV (postoperative nausea and vomiting)   . Prostatitis   . Schizophrenia ([place])   . Sinus node dysfunction ([place]) 02/13/2019    Patient Active Problem List   Diagnosis Date Noted  . Gait abnormality 10/22/2019  . Vitamin B12 deficiency 10/22/2019  . Idiopathic peripheral neuropathy 10/22/2019  . Paresthesia 09/08/2019  . Chronic low back pain with sciatica 09/08/2019  . Mobitz type 2 second degree AV block 04/06/2019  . Encounter for care of pacemaker 02/13/2019  . Sinus node  dysfunction ([place]) 02/13/2019  . Encephalopathy 11/19/2018  . GERD (gastroesophageal reflux disease) 11/19/2018  . Essential hypertension 06/24/2018  . Wide QRS ventricular tachycardia ([place]) 02/26/2017  . 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 CKD (chronic kidney disease), stage III ([place]) 09/16/2016  . Schizophrenia ([place]) 09/16/2016  . Upper GI bleed 09/16/2016  . Asthma 02/09/2016  . Herniated lumbar intervertebral disc 04/20/2011    Past Surgical History:  Procedure Laterality Date  . APPENDECTOMY    . COLONOSCOPY    .  ELECTROPHYSIOLOGY STUDY N/A 02/26/2017   Procedure: ELECTROPHYSIOLOGY STUDY;  Surgeon: Evans Lance, MD;  Location: [facility];  Service: Cardiovascular;  Laterality: N/A;  . ESOPHAGOGASTRODUODENOSCOPY (EGD) WITH PROPOFOL N/A 09/18/2016   Procedure: ESOPHAGOGASTRODUODENOSCOPY (EGD) WITH PROPOFOL;  Surgeon: Ronnette Juniper, MD;  Location: [facility];  Service: Gastroenterology;  Laterality: N/A;  . LUMBAR LAMINECTOMY/DECOMPRESSION MICRODISCECTOMY  04/19/2011   Procedure: LUMBAR LAMINECTOMY/DECOMPRESSION MICRODISCECTOMY;  Surgeon: Tobi Bastos;  Location: [facility];  Service: Orthopedics;  Laterality: Left;  Hemi LAminectomy/Microdiscectomy Lumbar four  - Lumbar five  on the Left (X-Ray)  . PACEMAKER IMPLANT N/A 02/26/2017   Procedure: PACEMAKER IMPLANT;  Surgeon: Evans Lance, MD;  Location: [facility];  Service: Cardiovascular;  Laterality: N/A;  . TONSILLECTOMY    . UPPER GASTROINTESTINAL ENDOSCOPY         Family History  Problem Relation Age of Onset  . High blood pressure Mother   . Alzheimer's disease Father   . Colon cancer Neg Hx   . Esophageal cancer Neg Hx   . Rectal cancer Neg Hx   . Stomach cancer Neg Hx     Social History   Tobacco Use  . Smoking status: Former Smoker    Packs/day: 1.00    Years: 30.00    Pack years: 30.00    Quit date: 05/08/2008    Years since quitting: 12.1  . Smokeless tobacco: Never Used  Vaping Use  . Vaping Use: Never used  Substance Use Topics  . Alcohol use: No  . Drug use: No    Home Medications Prior to Admission medications   Medication Sig Start Date End Date Taking? Authorizing Provider  ondansetron (ZOFRAN ODT) 4 MG disintegrating tablet Take 1 tablet (4 mg total) by mouth every 8 (eight) hours as needed for nausea or vomiting. 07/11/20  Yes Kinnie Feil, PA-C  acetaminophen (TYLENOL) 325 MG tablet Take 650 mg by mouth every 6 (six) hours as needed for mild pain or headache.    [provider]   albuterol (VENTOLIN HFA) 108 (90 Base) MCG/ACT inhaler Inhale 2 puffs into the lungs every 6 (six) hours as needed for wheezing or shortness of breath. 02/11/20   Vevelyn Francois, NP  benazepril (LOTENSIN) 10 MG tablet Take 1 tablet (10 mg total) by mouth daily. 04/15/19   Adrian Prows, MD  benazepril (LOTENSIN) 20 MG tablet Take 1 tablet by mouth 2 (two) times daily. 12/04/19   [provider]  budesonide-formoterol (SYMBICORT) 160-4.5 MCG/ACT inhaler Inhale 2 puffs into the lungs 2 (two) times daily. 02/12/20   Geralynn Rile, MD  buPROPion (WELLBUTRIN SR) 150 MG 12 hr tablet Take 150 mg by mouth 2 (two) times daily.    [provider]  cloNIDine (CATAPRES) 0.2 MG tablet Take 1 tablet (0.2 mg total) by mouth 2 (two) times daily. Please make yearly appt with Dr. Lovena Le for May  2022 for future refills. Thank you 1st attempt 06/11/20   Evans Lance, MD  Cyanocobalamin (VITAMIN B-12 PO) Take 1 tablet by mouth daily.    [provider]  ezetimibe (ZETIA) 10 MG tablet Take 1 tablet (10 mg total) by mouth daily. 05/21/20 08/19/20  O'NealCassie Freer, MD  Fenofibrate 50 MG CAPS Take 1 capsule (50 mg total) by mouth daily. 02/17/20 02/16/21  Vevelyn Francois, NP  fluticasone (FLONASE) 50 MCG/ACT nasal spray Place 1 spray into both nostrils daily as needed for allergies. 02/11/20   Vevelyn Francois, NP  gabapentin (NEURONTIN) 100 MG capsule Take 1 capsule (100 mg total) by mouth 3 (three) times daily. 09/08/19   Marcial Pacas, MD  hydrALAZINE (APRESOLINE) 50 MG tablet Take 1 tablet (50 mg total) by mouth 3 (three) times daily. 04/15/20   O'NealCassie Freer, MD  isosorbide dinitrate (ISORDIL) 30 MG tablet TAKE 1 TABLET(30 MG) BY MOUTH FOUR TIMES DAILY 04/15/20   O'Neal, Cassie Freer, MD  metoprolol tartrate (LOPRESSOR) 100 MG tablet Take 1 tablet (100 mg total) by mouth 2 (two) times daily. 04/15/20   Geralynn Rile, MD  montelukast (SINGULAIR) 10 MG tablet Take 1 tablet (10 mg  total) by mouth at bedtime. 02/11/20 02/10/21  Vevelyn Francois, NP  ondansetron (ZOFRAN ODT) 4 MG disintegrating tablet Take 1 tablet (4 mg total) by mouth every 8 (eight) hours as needed for nausea or vomiting. 01/19/20   Vevelyn Francois, NP  ondansetron (ZOFRAN) 4 MG tablet Please take 1 dose prior to your colonoscopy prep. If ineffective may take an additional dose 06/02/20   Thornton Park, MD  paliperidone (INVEGA) 6 MG 24 hr tablet Take 6 mg by mouth at bedtime.    [provider]  Pancrelipase, Lip-Prot-Amyl, (ZENPEP) 40000-126000 units CPEP Take 1 tablet by mouth with breakfast, with lunch, and with evening meal. 04/12/20   Thornton Park, MD  pantoprazole (PROTONIX) 40 MG tablet Take 1 tablet (40 mg total) by mouth 2 (two) times daily. 04/12/20   Thornton Park, MD  sucralfate (CARAFATE) 1 GM/10ML suspension Take 10 mLs (1 g total) by mouth 4 (four) times daily -  with meals and at bedtime. Patient taking differently: Take 1 g by mouth 3 (three) times daily as needed. 04/12/20   Thornton Park, MD  tamsulosin (FLOMAX) 0.4 MG CAPS capsule Take 1 capsule (0.4 mg total) by mouth daily. 02/12/20   Geralynn Rile, MD  torsemide (DEMADEX) 10 MG tablet Take 1 tablet (10 mg total) by mouth daily. 02/12/20   O'NealCassie Freer, MD    Allergies    Methylpyrrolidone, Niacin, Norvasc [amlodipine besylate], Oxybutynin chloride [oxybutynin chloride er], Vesicare [solifenacin succinate], Ciprofloxacin, Oxybutynin, and Solifenacin  Review of Systems   Review of Systems  Constitutional: Positive for chills, fatigue and fever.  HENT: Positive for congestion, rhinorrhea and sore throat.   Respiratory: Positive for cough and shortness of breath.   Cardiovascular: Positive for chest pain.  Gastrointestinal: Positive for abdominal pain, diarrhea, nausea and vomiting.  All other systems reviewed and are negative.   Physical Exam Updated Vital Signs BP (!) 212/118 (BP Location:  Right Arm)   Pulse 85   Temp (!) 97.4 F (36.3 C) (Oral)   Resp (!) 22   SpO2 99%   Physical Exam Vitals and nursing note reviewed.  Constitutional:      Appearance: He is well-developed.     Comments: Non toxic.  HENT:     Head:  Normocephalic and atraumatic.     Nose: Rhinorrhea present.     Mouth/Throat:     Pharynx: Posterior oropharyngeal erythema present.     Comments: Moist mucous membranes. Diffuse erythema in the oropharynx. Not visualized tonsils. No exudates, petechia. Normal sublingual space. No trismus. Normal phonation. Tolerating secretions. No stridor Eyes:     Conjunctiva/sclera: Conjunctivae normal.  Cardiovascular:     Rate and Rhythm: Normal rate and regular rhythm.     Heart sounds: Normal heart sounds.     Comments: 1+ radial and DP pulses bilaterally. No lower extremity edema. No reproducible chest wall tenderness. Pulmonary:     Effort: Pulmonary effort is normal.     Breath sounds: Normal breath sounds.  Abdominal:     General: Bowel sounds are normal.     Palpations: Abdomen is soft.     Tenderness: There is abdominal tenderness (Distractible, patient talking through exam).     Comments: No G/R/R. No suprapubic or CVA tenderness. Negative Murphy's and McBurney's  Musculoskeletal:        General: Normal range of motion.     Cervical back: Normal range of motion.  Skin:    General: Skin is warm and dry.     Capillary Refill: Capillary refill takes less than 2 seconds.  Neurological:     Mental Status: He is alert.  Psychiatric:        Behavior: Behavior normal.     ED Results / Procedures / Treatments   Labs (all labs ordered are listed, but only abnormal results are displayed) Labs Reviewed  COMPREHENSIVE METABOLIC PANEL - Abnormal; Notable for the following components:      Result Value   Glucose, Bld 113 (*)    Creatinine, Ser 1.92 (*)    GFR, Estimated 39 (*)    All other components within normal limits  URINALYSIS, ROUTINE W REFLEX  MICROSCOPIC - Abnormal; Notable for the following components:   Protein, ur 100 (*)    All other components within normal limits  SARS CORONAVIRUS 2 (TAT 6-24 HRS)  LIPASE, BLOOD  CBC  POC SARS CORONAVIRUS 2 AG -  ED  TROPONIN I (HIGH SENSITIVITY)    EKG EKG Interpretation  Date/Time:  Sunday July 11 2020 06:22:41 EST Ventricular Rate:  70 PR Interval:  188 QRS Duration: 166 QT Interval:  434 QTC Calculation: 468 R Axis:   48 Text Interpretation: Atrial-paced rhythm Left bundle branch block Abnormal ECG No sig change from Aug 2021 ecg Confirmed by Octaviano Glow [phone]) on 07/11/2020 7:51:29 AM   Radiology DG Chest 2 View  Result Date: 07/11/2020 CLINICAL DATA:  Cough.  High fever. EXAM: CHEST - 2 VIEW COMPARISON:  December 08, 2019 FINDINGS: Stable pacemaker. The cardiomediastinal silhouette, pleura, and lungs are otherwise unremarkable. IMPRESSION: No active cardiopulmonary disease. Electronically Signed   By: Dorise Bullion III M.D   On: 07/11/2020 09:11    Procedures Procedures   Medications Ordered in ED Medications  sucralfate (CARAFATE) 1 GM/10ML suspension 1 g (has no administration in time range)  pantoprazole (PROTONIX) EC tablet 40 mg (has no administration in time range)  ondansetron (ZOFRAN-ODT) disintegrating tablet 4 mg (has no administration in time range)  metoprolol tartrate (LOPRESSOR) tablet 100 mg (has no administration in time range)  cloNIDine (CATAPRES) tablet 0.2 mg (0.2 mg Oral Given 07/11/20 0813)  hydrALAZINE (APRESOLINE) tablet 50 mg (50 mg Oral Given 07/11/20 5621)    ED Course  I have reviewed the triage vital signs and the  nursing notes.  Pertinent labs & imaging results that were available during my care of the patient were reviewed by me and considered in my medical decision making (see chart for details).  Clinical Course as of 07/11/20 0940  Nancy Fetter Jul 11, 2020  0925 Creatinine(!): 1.92 Improved from previous  [CG]  0925 ED  EKG Atrial-paced rhythm Left bundle branch block Abnormal ECG No sig change from Aug 2021 ecg [CG]    Clinical Course User Index [CG] Arlean Hopping   MDM Rules/Calculators/A&P                          60 year old male presents for fever of 103, rhinorrhea, sore throat, cough, nausea, vomiting, diarrhea, abdominal pain, chest pain, shortness of breath for 2 days.  He is concerned about Covid infection.  Vaccinated with a booster.  No sick contacts.  EMR, triage nurse notes reviewed to obtain more history and assist with MDM  Follow-up closely by gastroenterology and cardiology for chronic issues.  History of chronic pancreatitis with chronic abdominal pain, nausea vomiting and diarrhea.  He is on Creon, Carafate, Protonix.  History of sinus node dysfunction s/p pacemaker.  Had single episode of chest pain 2 months ago and is awaiting stress test.  DDx: Likely viral etiology given his presenting symptoms.  He has minimal abdominal tenderness.  Does not appear significantly clinically dehydrated.  He is not having any chest pain or shortness of breath currently.  Chest pain may have been due to chronic pancreatitis, active nausea and vomiting.  ACS is considered less likely in this patient given no other symptoms associated today.  Doubt AAA, dissection, PE.  Belly exam is really benign and other etiologies like acute on chronic pancreatitis, GB process, diverticulitis considered but less likely.  Will order labs, imaging, medicines and reassess.  ER work-up including labs, imaging ordered by me as above  ER labs, imaging personally visualized and interpreted by me  Labs reveal-vastly reassuring.  Normal WBC.  Creatinine 1.92, better than previous.  Electrolytes normal.  LFTs, lipase normal.  Urinalysis with 100 protein, likely reflecting CKD or dehydration.  No UTI.  Rapid Covid negative, pending PCR.  Troponin 10, has remained chest pain-free.  No need for delta.  Imaging  reveals-atrial paced rhythm with known LBB.  Chest x-ray unremarkable  Meds given-patient hypertensive on arrival to the ED, oral BP meds given hydralazine, clonidine.  We will also give metoprolol and GI medicines for abdominal pain.  0935: Patient reevaluated.  Discussed her work-up is benign.  Suspect viral etiology.  He is tolerating p.o. here.  No repeat vomiting or diarrhea in the ED.  Abdomen non tender. Well-appearing.  Given benign work up, exam will defer further emergent work up/imaging. Will recheck BP prior to discharge but if improving appropriate for discharge with symptomatic management.  Rx for Zofran.  Gentle diet, oral hydration, PCP follow-up recommended.  Return precautions discussed with patient.  He is comfortable with this plan.  Final Clinical Impression(s) / ED Diagnoses Final diagnoses:  Viral illness  Nausea vomiting and diarrhea  Cough  Fever in adult  Elevated blood pressure reading with diagnosis of hypertension    Rx / DC Orders ED Discharge Orders         Ordered    ondansetron (ZOFRAN ODT) 4 MG disintegrating tablet  Every 8 hours PRN        07/11/20 0940  Kinnie Feil, PA-C 07/11/20 0940    Wyvonnia Dusky, MD 07/11/20 [id number]

## 2020-07-11 NOTE — ED Triage Notes (Signed)
Pt arrived POV with c/o abd pain, emesis x 2, "very high" fever at home, took 1 tylenol 1-2 hours ago. Pt c/o chronic pain in legs. Pt appears to have difficulty staying on conversation topic, rambling at times.

## 2020-07-11 NOTE — Discharge Instructions (Addendum)
Your symptoms are most likely from a virus   COVID PCR is pending. Check your Mychart at the end of the day or tomorrow for results. Your rapid COVID was negative today.    The main treatment approach for a viral infection is to treat the symptoms, support your immune system and prevent spread of illness.    Stay well-hydrated. Rest. You can use over the counter medications to help with symptoms: Acetaminophen (tylenol) every 6 hours, around the clock to help with associated fevers, sore throat, headaches, generalized body aches and malaise.  Oxymetazoline (afrin) intranasal spray once daily for no more than 3 days to help with congestion, after 3 days you can switch to another over-the-counter nasal steroid spray such as fluticasone (flonase) Allergy medication (loratadine, cetirizine, etc) and phenylephrine (sudafed) help with nasal congestion, runny nose and postnasal drip.   Dextromethorphan (Delsym) to suppress dry cough  Guaifenesin (mucinex) to help with built up mucus in chest and productive cough Wash your hands often to prevent spread.  Ondansatron (Zofran) for nausea    A viral infection can also worsen or cause severe dehydration.  Monitor your symptoms. Return for persistent fevers, chest pain or shortness of breath on exertion, worsening bloody or productive cough, inability to tolerate fluids despite nausea medicines or dehydration, severe abdominal pain, blood in vomit or stool

## 2020-07-15 ENCOUNTER — Telehealth: Payer: Self-pay

## 2020-07-15 MED ORDER — FUROSEMIDE 20 MG PO TABS: 20.0000 mg | ORAL_TABLET | Freq: Every day | ORAL | 3 refills | Status: DC

## 2020-07-15 NOTE — Telephone Encounter (Signed)
Spoke with patient, made patient aware of Dr. Kathalene Frames recommendations. Torsemide removed from patients medication list. Lasix 20mg  Daily order has been placed and sent to patient preferred pharmacy. Patient aware and verbalized understanding.

## 2020-07-15 NOTE — Telephone Encounter (Signed)
Prescribe 20 mg lasix daily. Stop torsemide. He has a psychiatric history, not sure if that is what is going on.   [place] T. Audie Box, MD, [place]  [address] [place], [place] [address] [phone]  2:20 PM

## 2020-07-15 NOTE — Telephone Encounter (Signed)
Received call from Registration at front desk into triage regarding patient coming in to office to inquire about his medications. Per front desk staff patient states he is allergic to Torsedmide and that it causes itching all over. Per staff, patient states he has not taken the medication since last year and is requesting a new medication today to help, patient states his legs and ankles are very swollen. Per front desk staff patient denies recent weight gain, and denies any other symptoms. Patient very adamant about getting a new medication today. Advised front desk staff that I would forward message to Dr. Audie Box in regards to this and we will call and let him know.   Received call again from front desk staff into triage stating that patient was very upset and stated "he was going to find another dr" "he was leaving this F**ing place" and then started cursing at front desk staff. Patient then upon leaving office said to front desk staff "F* you" before walking out.    Will forward message to MD.

## 2020-08-06 ENCOUNTER — Encounter: Payer: PPO | Admitting: Gastroenterology

## 2020-08-10 ENCOUNTER — Other Ambulatory Visit: Payer: Self-pay

## 2020-08-10 ENCOUNTER — Ambulatory Visit (INDEPENDENT_AMBULATORY_CARE_PROVIDER_SITE_OTHER): Payer: PPO | Admitting: Allergy & Immunology

## 2020-08-10 ENCOUNTER — Encounter: Payer: Self-pay | Admitting: Allergy & Immunology

## 2020-08-10 VITALS — BP 160/100 | HR 75 | Temp 97.2°F | Resp 16 | Ht 73.0 in | Wt 234.0 lb

## 2020-08-10 DIAGNOSIS — J3089 Other allergic rhinitis: Secondary | ICD-10-CM | POA: Diagnosis not present

## 2020-08-10 DIAGNOSIS — J302 Other seasonal allergic rhinitis: Secondary | ICD-10-CM | POA: Diagnosis not present

## 2020-08-10 DIAGNOSIS — J449 Chronic obstructive pulmonary disease, unspecified: Secondary | ICD-10-CM

## 2020-08-10 DIAGNOSIS — K219 Gastro-esophageal reflux disease without esophagitis: Secondary | ICD-10-CM

## 2020-08-10 NOTE — Patient Instructions (Addendum)
1. Asthma-COPD overlap syndrome ([place]) - Lung testing was in the 40% range, but did improve. - We are going to change you from Symbicort to a medication called Breztri, which contains Symbicort + a third medicine to help with your breathing. - Spacer sample and demonstration provided. - Daily controller medication(s): Breztri 2 puffs twice daily with a spacer - Prior to physical activity: albuterol 2 puffs 10-15 minutes before physical activity. - Rescue medications: albuterol 4 puffs every 4-6 hours as needed - Asthma control goals:  * Full participation in all desired activities (may need albuterol before activity) * Albuterol use two time or less a week on average (not counting use with activity) * Cough interfering with sleep two time or less a month * Oral steroids no more than once a year * No hospitalizations  2. Seasonal and perennial allergic rhinitis - Testing today showed: grasses, ragweed, trees, indoor molds, outdoor molds, dust mites and dog - Copy of test results provided.  - Avoidance measures provided. - Stop taking:  - Continue with: Flonase (fluticasone) two sprays per nostril daily - Start taking: Zyrtec (cetirizine) 10mg  tablet once daily and Astelin (azelastine) 2 sprays per nostril 1-2 times daily as needed DURING THE WORST TIMES OF THE YEAR - You can use an extra dose of the antihistamine, if needed, for breakthrough symptoms.  - Consider nasal saline rinses 1-2 times daily to remove allergens from the nasal cavities as well as help with mucous clearance (this is especially helpful to do before the nasal sprays are given) - Consider allergy shots as a means of long-term control. - Allergy shots "re-train" and "reset" the immune system to ignore environmental allergens and decrease the resulting immune response to those allergens (sneezing, itchy watery eyes, runny nose, nasal congestion, etc).    - Allergy shots improve symptoms in 75-85% of patients.  - We can  discuss more at the next appointment if the medications are not working for you.  3. Return in about 4 weeks (around 09/07/2020).    Please inform us of any Emergency Department visits, hospitalizations, or changes in symptoms. Call us before going to the ED for breathing or allergy symptoms since we might be able to fit you in for a sick visit. Feel free to contact us anytime with any questions, problems, or concerns.  It was a pleasure to meet you today!  Websites that have reliable patient information: 1. American Academy of Asthma, Allergy, and Immunology: www.aaaai.org 2. Food Allergy Research and Education (FARE): foodallergy.org 3. Mothers of Asthmatics: http://www.asthmacommunitynetwork.org 4. American College of Allergy, Asthma, and Immunology: www.acaai.org   COVID-19 Vaccine Information can be found at: [email or web address] For questions related to vaccine distribution or appointments, please email [email or web address] or call [phone].   We realize that you might be concerned about having an allergic reaction to the COVID19 vaccines. To help with that concern, WE ARE OFFERING THE COVID19 VACCINES IN OUR OFFICE! Ask the front desk for dates!     "Like" [place] on Facebook and Instagram for our latest updates!      A healthy democracy works best when [employer] participate! Make sure you are registered to vote! If you have moved or changed any of your contact information, you will need to get this updated before voting!  In some cases, you MAY be able to register to vote online: [email or web address]    1. Control-Buffer 50% Glycerol Negative   2. Control-Histamine 1 mg/ml 2+   3. Albumin saline Negative  4. [place] Negative   5. [place] Negative   6. Johnson Negative   7. [place] Blue Negative   8. Meadow Fescue Negative   9. Perennial Rye Negative   10. Sweet Vernal Negative   11.  Timothy Negative   12. Cocklebur Negative   13. Burweed Marshelder Negative   14. Ragweed, short Negative   15. Ragweed, Giant Negative   16. Plantain,  English Negative   17. Lamb's Quarters Negative   18. Sheep Sorrell Negative   19. Rough Pigweed Negative   20. Marsh Elder, Rough Negative   21. Mugwort, Common Negative   22. Ash mix Negative   23. Birch mix Negative   24. Beech American Negative   25. Box, Elder Negative   26. Cedar, red Negative   27. Cottonwood, [place] Negative   28. Elm mix Negative   29. Hickory Negative   30. Maple mix Negative   31. Oak, [place] mix Negative   32. Pecan Pollen Negative   33. Pine mix Negative   34. Sycamore Eastern Negative   35. Gumlog, Black Pollen Negative   36. Alternaria alternata Negative   37. Cladosporium Herbarum Negative   38. Aspergillus mix Negative   39. Penicillium mix Negative   40. Bipolaris sorokiniana (Helminthosporium) Negative   41. Drechslera spicifera (Curvularia) Negative   42. Mucor plumbeus Negative   43. Fusarium moniliforme Negative   44. Aureobasidium pullulans (pullulara) Negative   45. Rhizopus oryzae Negative   46. Botrytis cinera Negative   47. Epicoccum nigrum Negative   48. Phoma betae Negative   49. Candida Albicans Negative   50. Trichophyton mentagrophytes Negative   51. Mite, D Farinae  5,000 AU/ml Negative   52. Mite, D Pteronyssinus  5,000 AU/ml Negative   53. Cat Hair 10,000 BAU/ml Negative   54.  Dog Epithelia Negative   55. Mixed Feathers Negative   56. Horse Epithelia Negative   57. Cockroach, German Negative   58. Mouse Negative   59. Tobacco Leaf Negative     Control Negative   [place] Negative   Johnson 1+   7 Grass 1+   Ragweed mix 2+   Weed mix Negative   Tree mix 1+   Mold 1 3+   Mold 2 3+   Mold 3 3+   Mold 4 3+   Cat Negative   Dog 2+   Cockroach Negative   Mite mix 2+
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
## 2020-08-19 DIAGNOSIS — F209 Schizophrenia, unspecified: Secondary | ICD-10-CM | POA: Diagnosis not present

## 2020-08-19 DIAGNOSIS — N1330 Unspecified hydronephrosis: Secondary | ICD-10-CM | POA: Diagnosis not present

## 2020-08-19 DIAGNOSIS — N1832 Chronic kidney disease, stage 3b: Secondary | ICD-10-CM | POA: Diagnosis not present

## 2020-08-19 DIAGNOSIS — I129 Hypertensive chronic kidney disease with stage 1 through stage 4 chronic kidney disease, or unspecified chronic kidney disease: Secondary | ICD-10-CM | POA: Diagnosis not present

## 2020-08-24 ENCOUNTER — Ambulatory Visit: Payer: PPO | Admitting: Neurology

## 2020-08-30 ENCOUNTER — Telehealth: Payer: Self-pay | Admitting: Neurology

## 2020-08-30 NOTE — Telephone Encounter (Signed)
I spoke to the patient's wife to let her know his PCP will need to place the referral. She appreciated the call and will contact that office.

## 2020-08-30 NOTE — Telephone Encounter (Signed)
Pt's wife called wanting the RN to put in a referral to Dr. Posey Pronto in [place] for a second opinion on the pt's Neuropathy. Please advise.

## 2020-09-06 ENCOUNTER — Telehealth: Payer: Self-pay | Admitting: Nurse Practitioner

## 2020-09-06 NOTE — Telephone Encounter (Signed)
Pt was called to schedule a AWV appointment PT hung up the phone

## 2020-09-08 ENCOUNTER — Ambulatory Visit (INDEPENDENT_AMBULATORY_CARE_PROVIDER_SITE_OTHER): Payer: PPO | Admitting: Family Medicine

## 2020-09-08 ENCOUNTER — Other Ambulatory Visit: Payer: Self-pay

## 2020-09-08 ENCOUNTER — Encounter: Payer: Self-pay | Admitting: Family Medicine

## 2020-09-08 VITALS — BP 126/80 | HR 69 | Temp 97.6°F | Resp 18 | Ht 73.0 in | Wt 229.4 lb

## 2020-09-08 DIAGNOSIS — J3089 Other allergic rhinitis: Secondary | ICD-10-CM

## 2020-09-08 DIAGNOSIS — J302 Other seasonal allergic rhinitis: Secondary | ICD-10-CM

## 2020-09-08 DIAGNOSIS — K219 Gastro-esophageal reflux disease without esophagitis: Secondary | ICD-10-CM

## 2020-09-08 DIAGNOSIS — J449 Chronic obstructive pulmonary disease, unspecified: Secondary | ICD-10-CM

## 2020-09-08 MED ORDER — ALBUTEROL SULFATE HFA 108 (90 BASE) MCG/ACT IN AERS: 2.0000 | INHALATION_SPRAY | Freq: Four times a day (QID) | RESPIRATORY_TRACT | 1 refills | Status: DC | PRN

## 2020-09-08 MED ORDER — BREZTRI AEROSPHERE 160-9-4.8 MCG/ACT IN AERO: INHALATION_SPRAY | RESPIRATORY_TRACT | 2 refills | Status: DC

## 2020-09-08 MED ORDER — MONTELUKAST SODIUM 10 MG PO TABS
10.0000 mg | ORAL_TABLET | Freq: Every day | ORAL | 1 refills | Status: DC
Start: 2020-09-08 — End: 2020-12-09

## 2020-09-08 MED ORDER — FLUTICASONE PROPIONATE 50 MCG/ACT NA SUSP
1.0000 | Freq: Every day | NASAL | 3 refills | Status: DC | PRN
Start: 2020-09-08 — End: 2022-10-05

## 2020-09-08 NOTE — Progress Notes (Signed)
[place] [address] Dept: [phone]  FOLLOW UP NOTE  Patient ID: Peter Marsh, male    DOB: [birth year]  Age: 60 y.o. MRN: [id number] Date of Office Visit: 09/08/2020  Assessment  Chief Complaint: Asthma (New inhaler is helping with asthma/COPD)  HPI Peter Marsh is a 60 year old male who presents to the clinic for follow-up visit.  He was last seen in this clinic on 08/10/2020 by Dr. Ernst Bowler for evaluation of asthma/COPD, allergic rhinitis, and reflux.  At today's visit, he reports his breathing as somewhat improved since his last visit to this clinic.  He denies shortness of breath and cough with activity and rest.  He does report occasional wheeze occurring mostly overnight for which he uses albuterol with relief of wheeze.  He continues Breztri 2 puffs twice a day with a spacer and uses albuterol about 2 days a week with relief of symptoms.  Allergic rhinitis is reported as well controlled with no current symptoms.  He occasionally uses Flonase with relief of symptoms.  He has previously used cetirizine and saline nasal rinse, however, he does not need these interventions at this time.  Reflux is reported as well controlled with no symptoms including heartburn or vomiting.  He continues pantoprazole twice a day and Carafate occasionally.  He continues to follow up with Dr. Tarri Glenn, GI specialist.  His current medications are listed in the chart.   Drug Allergies:  Allergies  Allergen Reactions  . Methylpyrrolidone Hives    froze the intestine  . Niacin Other (See Comments) and Nausea And Vomiting    Flushing, itching, tingling   . Norvasc [Amlodipine Besylate] Other (See Comments)    Swollen Feet  . Other Other (See Comments)  . Oxybutynin Chloride [Oxybutynin Chloride Er] Other (See Comments)    froze the intestine  . Vesicare [Solifenacin Succinate] Other (See Comments)    Froze the intestine   . Ciprofloxacin Rash and Other (See Comments)     Felt flushed   . Oxybutynin Rash  . Solifenacin Rash    Physical Exam: BP 126/80   Pulse 69   Temp 97.6 F (36.4 C)   Resp 18   Ht 6\' 1"  (1.854 m)   Wt 229 lb 6.4 oz (104.1 kg)   SpO2 96%   BMI 30.27 kg/m    Physical Exam Vitals reviewed.  Constitutional:      Appearance: Normal appearance.  HENT:     Head: Normocephalic and atraumatic.     Right Ear: Tympanic membrane normal.     Left Ear: Tympanic membrane normal.     Nose:     Comments: Bilateral naris slightly erythematous with clear nasal drainage noted.  Pharynx normal.  Ears normal.  Eyes normal.    Mouth/Throat:     Pharynx: Oropharynx is clear.  Eyes:     Conjunctiva/sclera: Conjunctivae normal.  Cardiovascular:     Rate and Rhythm: Normal rate and regular rhythm.     Heart sounds: Normal heart sounds. No murmur heard.   Pulmonary:     Effort: Pulmonary effort is normal.     Breath sounds: Normal breath sounds.     Comments: Lungs clear to auscultation Musculoskeletal:        General: Normal range of motion.     Cervical back: Normal range of motion and neck supple.  Skin:    General: Skin is warm and dry.  Neurological:     Mental Status: He is alert and oriented  to person, place, and time.  Psychiatric:        Mood and Affect: Mood normal.        Behavior: Behavior normal.        Thought Content: Thought content normal.        Judgment: Judgment normal.     Diagnostics: FVC 2.18, FEV1 1.64.  Predicted FVC 4.88, predicted FEV1 3.69.  Spirometry indicates severe restriction.  This is consistent with previous spirometry readings.  Assessment and Plan: 1. Asthma-COPD overlap syndrome ([place])   2. Seasonal and perennial allergic rhinitis   3. Gastroesophageal reflux disease, unspecified whether esophagitis present     Meds ordered this encounter  Medications  . Budeson-Glycopyrrol-Formoterol (BREZTRI AEROSPHERE) 160-9-4.8 MCG/ACT AERO    Sig: 2 puffs twice a day with a spacer    Dispense:  10.7  g    Refill:  2  . albuterol (VENTOLIN HFA) 108 (90 Base) MCG/ACT inhaler    Sig: Inhale 2 puffs into the lungs every 6 (six) hours as needed for wheezing or shortness of breath.    Dispense:  18 g    Refill:  1  . montelukast (SINGULAIR) 10 MG tablet    Sig: Take 1 tablet (10 mg total) by mouth at bedtime.    Dispense:  90 tablet    Refill:  1  . fluticasone (FLONASE) 50 MCG/ACT nasal spray    Sig: Place 1 spray into both nostrils daily as needed for allergies.    Dispense:  11.1 mL    Refill:  3    Patient Instructions  Ashtma/COPD Continue Brezrti-2 puffs twice a day with a spacer to prevent cough or wheeze Continue montelukast 10 mg once a day to prevent cough or wheeze Continue albuterol 2 puffs once every 4 hours as needed for cough or wheeze  Allergic rhinitis Continue allergen avoidance measures directed toward pollen, mold, dust mite, and dog as listed below Continue cetirizine 10 mg once a day as needed for runny nose or itch Continue Flonase 2 sprays in each nostril once a day as needed for stuffy nose Consider saline nasal rinses as needed for nasal symptoms. Use this before any medicated nasal sprays for best result  Reflux Continue dietary and lifestyle modifications as listed below Continue your current medications as prescribed by Dr. Modena Nunnery  Call the clinic if this treatment plan is not working well for you  Follow up in 3 months or sooner if needed.   Return in about 3 months (around 12/09/2020), or if symptoms worsen or fail to improve.    Thank you for the opportunity to care for this patient.  Please do not hesitate to contact me with questions.  Gareth Morgan, FNP [facility]

## 2020-09-08 NOTE — Patient Instructions (Addendum)
Ashtma/COPD Continue Brezrti-2 puffs twice a day with a spacer to prevent cough or wheeze Continue montelukast 10 mg once a day to prevent cough or wheeze Continue albuterol 2 puffs once every 4 hours as needed for cough or wheeze  Allergic rhinitis Continue allergen avoidance measures directed toward pollen, mold, dust mite, and dog as listed below Continue cetirizine 10 mg once a day as needed for runny nose or itch Continue Flonase 2 sprays in each nostril once a day as needed for stuffy nose Consider saline nasal rinses as needed for nasal symptoms. Use this before any medicated nasal sprays for best result  Reflux Continue dietary and lifestyle modifications as listed below Continue your current medications as prescribed by Dr. Modena Nunnery  Call the clinic if this treatment plan is not working well for you  Follow up in 3 months or sooner if needed.

## 2020-09-20 ENCOUNTER — Ambulatory Visit (INDEPENDENT_AMBULATORY_CARE_PROVIDER_SITE_OTHER): Payer: PPO | Admitting: Nurse Practitioner

## 2020-09-20 ENCOUNTER — Encounter: Payer: Self-pay | Admitting: Nurse Practitioner

## 2020-09-20 ENCOUNTER — Other Ambulatory Visit: Payer: Self-pay

## 2020-09-20 ENCOUNTER — Telehealth: Payer: Self-pay

## 2020-09-20 VITALS — BP 151/86 | HR 64 | Temp 97.2°F | Ht 72.0 in | Wt 227.0 lb

## 2020-09-20 DIAGNOSIS — I130 Hypertensive heart and chronic kidney disease with heart failure and stage 1 through stage 4 chronic kidney disease, or unspecified chronic kidney disease: Secondary | ICD-10-CM | POA: Insufficient documentation

## 2020-09-20 DIAGNOSIS — I208 Other forms of angina pectoris: Secondary | ICD-10-CM | POA: Diagnosis not present

## 2020-09-20 DIAGNOSIS — F209 Schizophrenia, unspecified: Secondary | ICD-10-CM | POA: Diagnosis not present

## 2020-09-20 DIAGNOSIS — Z13220 Encounter for screening for lipoid disorders: Secondary | ICD-10-CM | POA: Diagnosis not present

## 2020-09-20 DIAGNOSIS — Z1159 Encounter for screening for other viral diseases: Secondary | ICD-10-CM | POA: Diagnosis not present

## 2020-09-20 LAB — POCT URINALYSIS DIPSTICK
Bilirubin, UA: NEGATIVE
Glucose, UA: NEGATIVE
Ketones, UA: NEGATIVE
Leukocytes, UA: NEGATIVE
Nitrite, UA: NEGATIVE
Protein, UA: POSITIVE — AB
Spec Grav, UA: 1.025 (ref 1.010–1.025)
Urobilinogen, UA: 0.2 E.U./dL
pH, UA: 6 (ref 5.0–8.0)

## 2020-09-20 NOTE — Telephone Encounter (Signed)
Will fax referral to [facility].

## 2020-09-20 NOTE — Progress Notes (Signed)
[place],   [address] Phone:  [phone]   Fax:  [phone]   Established Patient Office Visit  Subjective:  Patient ID: Peter Marsh, male    DOB: 07/10/1960  Age: 60 y.o. MRN: [id number]  CC:  Chief Complaint  Patient presents with   Follow-up    6 month follow up, no questions or concerns.     HPI Peter Marsh presents for follow up. He  has a past medical history of Allergy, Anxiety, Asthma, Bilateral carotid bruits, Cardiac conduction disorder (2018), Cataract, Chronic kidney disease, Chronic kidney disease (CKD), stage IV (severe) ([place]), COPD (chronic obstructive pulmonary disease) ([place]), Depression, Encounter for care of pacemaker (02/13/2019), GERD (gastroesophageal reflux disease), History of stomach ulcers (2001), Hypertension, LBBB (left bundle branch block), Lower extremity edema, Mild intermittent asthma without complication, Mixed hyperlipidemia, Mobitz type 2 second degree AV block (04/06/2019), Neuropathy, Pacemaker: Medtronic Azure XT DR MRI [id number] PPM -  BUNDLE OF HIS pacing  (02/26/2017), Paroxysmal atrial flutter ([place]), PONV (postoperative nausea and vomiting), Prostatitis, Recurrent upper respiratory infection (URI), Schizophrenia ([place]), Sinus node dysfunction ([place]) (02/13/2019), and Urticaria.   Hypertension Patient is here for follow-up of elevated blood pressure. He is not exercising and is adherent to a low-salt diet. Blood pressure is varies at home. Cardiac symptoms: none. Patient denies chest pain, chest pressure/discomfort, claudication, dyspnea, exertional chest pressure/discomfort, fatigue, irregular heart beat, near-syncope, orthopnea, palpitations, paroxysmal nocturnal dyspnea, and syncope. Cardiovascular risk factors: advanced age (older than 77 for men, 9 for women), dyslipidemia, hypertension, male gender, obesity (BMI >= 30 kg/m2) and sedentary lifestyle. Use of agents associated with  hypertension: none. History of target organ damage: angina/ prior myocardial infarction.  He reports that he is under increased stress. He is currently living in affordable housing and it is stressful and he is having neighbor issues.   Past Medical History:  Diagnosis Date   Allergy    Anxiety    Asthma    uses inhalers    Bilateral carotid bruits    Cardiac conduction disorder 2018   s/p MDT PPM   Cataract    Chronic kidney disease    bladder interstial cystitis    Chronic kidney disease (CKD), stage IV (severe) ([place])    followed by Dr. Joelyn Oms at [facility]   COPD (chronic obstructive pulmonary disease) ([place])    Depression    Encounter for care of pacemaker 02/13/2019   GERD (gastroesophageal reflux disease)    History of stomach ulcers 2001   Hypertension    LBBB (left bundle branch block)    Lower extremity edema    Mild intermittent asthma without complication    Mixed hyperlipidemia    Mobitz type 2 second degree AV block 04/06/2019   Neuropathy    Pacemaker: Medtronic Azure XT DR MRI [id number] PPM -  BUNDLE OF HIS pacing  02/26/2017   Scheduled Remote pacemaker check  11/12/2018:  There were 24 Fast AV episodes:  EGMs show SVTs. Episodes lasted < 2 minutes. Health trends do not demonstrate significant abnormality. Battery longevity is 9.4 - 10.3 years. RA pacing is 47.1 %, RV pacing is 40.4 %.  Clinic check 11/07/17.    Paroxysmal atrial flutter ([name])    PONV (postoperative nausea and vomiting)    Prostatitis    Recurrent upper respiratory infection (URI)    Schizophrenia ([name])    Sinus node dysfunction ([place]) 02/13/2019   Urticaria     Past  Surgical History:  Procedure Laterality Date   ADENOIDECTOMY     APPENDECTOMY     COLONOSCOPY     ELECTROPHYSIOLOGY STUDY N/A 02/26/2017   Procedure: ELECTROPHYSIOLOGY STUDY;  Surgeon: Evans Lance, MD;  Location: [facility];  Service: Cardiovascular;  Laterality: N/A;   ESOPHAGOGASTRODUODENOSCOPY (EGD) WITH  PROPOFOL N/A 09/18/2016   Procedure: ESOPHAGOGASTRODUODENOSCOPY (EGD) WITH PROPOFOL;  Surgeon: Ronnette Juniper, MD;  Location: [facility];  Service: Gastroenterology;  Laterality: N/A;   LUMBAR LAMINECTOMY/DECOMPRESSION MICRODISCECTOMY  04/19/2011   Procedure: LUMBAR LAMINECTOMY/DECOMPRESSION MICRODISCECTOMY;  Surgeon: Tobi Bastos;  Location: [facility];  Service: Orthopedics;  Laterality: Left;  Hemi LAminectomy/Microdiscectomy Lumbar four  - Lumbar five  on the Left (X-Ray)   PACEMAKER IMPLANT N/A 02/26/2017   Procedure: PACEMAKER IMPLANT;  Surgeon: Evans Lance, MD;  Location: [facility];  Service: Cardiovascular;  Laterality: N/A;   TONSILLECTOMY     UPPER GASTROINTESTINAL ENDOSCOPY      Family History  Problem Relation Age of Onset   High blood pressure Mother    Alzheimer's disease Father    Colon cancer Neg Hx    Esophageal cancer Neg Hx    Rectal cancer Neg Hx    Stomach cancer Neg Hx     Social History   Socioeconomic History   Marital status: Married    Spouse name: Not on file   Number of children: 1   Years of education: 12   Highest education level: High school graduate  Occupational History   Occupation: Retired  Tobacco Use   Smoking status: Former Smoker    Packs/day: 1.00    Years: 30.00    Pack years: 30.00    Quit date: 05/08/2008    Years since quitting: 12.3   Smokeless tobacco: Never Used  Scientific laboratory technician Use: Never used  Substance and Sexual Activity   Alcohol use: No   Drug use: No   Sexual activity: Never  Other Topics Concern   Not on file  Social History Narrative   Lives at home with wife.   Right-handed.   One cup caffeine per day.   Social Determinants of Health   Financial Resource Strain: Not on file  Food Insecurity: Not on file  Transportation Needs: Not on file  Physical Activity: Not on file  Stress: Not on file  Social Connections: Not on file  Intimate Partner Violence: Not on file    Outpatient Medications  Prior to Visit  Medication Sig Dispense Refill   acetaminophen (TYLENOL) 325 MG tablet Take 650 mg by mouth every 6 (six) hours as needed for mild pain or headache.     albuterol (VENTOLIN HFA) 108 (90 Base) MCG/ACT inhaler Inhale 2 puffs into the lungs every 6 (six) hours as needed for wheezing or shortness of breath. 18 g 1   benazepril (LOTENSIN) 10 MG tablet Take 1 tablet (10 mg total) by mouth daily. 90 tablet 1   Budeson-Glycopyrrol-Formoterol (BREZTRI AEROSPHERE) 160-9-4.8 MCG/ACT AERO 2 puffs twice a day with a spacer 10.7 g 2   buPROPion (WELLBUTRIN SR) 150 MG 12 hr tablet Take 150 mg by mouth 2 (two) times daily.     cloNIDine (CATAPRES) 0.2 MG tablet Take 1 tablet (0.2 mg total) by mouth 2 (two) times daily. Please make yearly appt with Dr. Lovena Le for May 2022 for future refills. Thank you 1st attempt 60 tablet 3   Cyanocobalamin (VITAMIN B-12 PO) Take 1 tablet by mouth daily.  Fenofibrate 50 MG CAPS Take 1 capsule (50 mg total) by mouth daily. 30 capsule 11   fluticasone (FLONASE) 50 MCG/ACT nasal spray Place 1 spray into both nostrils daily as needed for allergies. 11.1 mL 3   furosemide (LASIX) 20 MG tablet Take 1 tablet (20 mg total) by mouth daily. 30 tablet 3   gabapentin (NEURONTIN) 100 MG capsule Take 1 capsule (100 mg total) by mouth 3 (three) times daily. 90 capsule 11   hydrALAZINE (APRESOLINE) 50 MG tablet Take 1 tablet (50 mg total) by mouth 3 (three) times daily. 270 tablet 1   isosorbide dinitrate (ISORDIL) 30 MG tablet TAKE 1 TABLET(30 MG) BY MOUTH FOUR TIMES DAILY 360 tablet 1   metoprolol tartrate (LOPRESSOR) 100 MG tablet Take 1 tablet (100 mg total) by mouth 2 (two) times daily. 180 tablet 1   montelukast (SINGULAIR) 10 MG tablet Take 1 tablet (10 mg total) by mouth at bedtime. 90 tablet 1   ondansetron (ZOFRAN ODT) 4 MG disintegrating tablet Take 1 tablet (4 mg total) by mouth every 8 (eight) hours as needed for nausea or vomiting. 20 tablet 0   paliperidone  (INVEGA) 6 MG 24 hr tablet Take 6 mg by mouth at bedtime.     Pancrelipase, Lip-Prot-Amyl, (ZENPEP) 40000-126000 units CPEP Take 1 tablet by mouth with breakfast, with lunch, and with evening meal. 90 capsule 2   pantoprazole (PROTONIX) 40 MG tablet Take 1 tablet (40 mg total) by mouth 2 (two) times daily. 180 tablet 3   sucralfate (CARAFATE) 1 GM/10ML suspension Take 10 mLs (1 g total) by mouth 4 (four) times daily -  with meals and at bedtime. (Patient taking differently: Take 1 g by mouth 3 (three) times daily as needed.) 3600 mL 3   tamsulosin (FLOMAX) 0.4 MG CAPS capsule Take 1 capsule (0.4 mg total) by mouth daily. 90 capsule 2   ezetimibe (ZETIA) 10 MG tablet Take 1 tablet (10 mg total) by mouth daily. 90 tablet 3   benazepril (LOTENSIN) 20 MG tablet Take 1 tablet by mouth 2 (two) times daily. (Patient not taking: Reported on 09/08/2020)     Pancrelipase, Lip-Prot-Amyl, (ZENPEP) 40000-126000 units CPEP See admin instructions.     Facility-Administered Medications Prior to Visit  Medication Dose Route Frequency Provider Last Rate Last Admin   acetaminophen (OFIRMEV) IVPB    PRN Lissa Morales, CRNA   1,000 mg at 04/19/11 3762   ePHEDrine injection    PRN Lissa Morales, CRNA   5 mg at 04/19/11 1018   glycopyrrolate (ROBINUL) injection    PRN Lissa Morales, CRNA   0.8 mg at 04/19/11 1037   SUFentanil (SUFENTA) injection    PRN Lissa Morales, CRNA   10 mcg at 04/19/11 1008    Allergies  Allergen Reactions   Methylpyrrolidone Hives    froze the intestine   Niacin Other (See Comments) and Nausea And Vomiting    Flushing, itching, tingling    Norvasc [Amlodipine Besylate] Other (See Comments)    Swollen Feet   Other Other (See Comments)   Oxybutynin Chloride [Oxybutynin Chloride Er] Other (See Comments)    froze the intestine   Vesicare [Solifenacin Succinate] Other (See Comments)    Froze the intestine    Ciprofloxacin Rash and Other (See Comments)    Felt flushed    Oxybutynin  Rash   Solifenacin Rash    ROS Review of Systems    Objective:    Physical Exam Constitutional:  Appearance: He is obese.  HENT:     Head: Normocephalic and atraumatic.     Nose: Nose normal.     Mouth/Throat:     Mouth: Mucous membranes are moist.  Cardiovascular:     Rate and Rhythm: Normal rate and regular rhythm.     Pulses: Normal pulses.     Heart sounds: Normal heart sounds.  Pulmonary:     Effort: Pulmonary effort is normal.     Breath sounds: Normal breath sounds.  Abdominal:     Palpations: Abdomen is soft.  Musculoskeletal:        General: Normal range of motion.     Cervical back: Normal range of motion.  Skin:    General: Skin is warm.     Capillary Refill: Capillary refill takes less than 2 seconds.     Coloration: Skin is pale.  Neurological:     General: No focal deficit present.     Mental Status: He is alert and oriented to person, place, and time.  Psychiatric:        Mood and Affect: Mood normal.        Behavior: Behavior normal.        Thought Content: Thought content normal.        Judgment: Judgment normal.     BP (!) 151/86 (BP Location: Left Arm)   Pulse 64   Temp (!) 97.2 F (36.2 C)   Ht 6' (1.829 m)   Wt 227 lb 0.6 oz (103 kg)   SpO2 100%   BMI 30.79 kg/m  Wt Readings from Last 3 Encounters:  09/20/20 227 lb 0.6 oz (103 kg)  09/08/20 229 lb 6.4 oz (104.1 kg)  08/10/20 234 lb (106.1 kg)     There are no preventive care reminders to display for this patient.  There are no preventive care reminders to display for this patient.  Lab Results  Component Value Date   TSH 1.140 09/08/2019   Lab Results  Component Value Date   WBC 7.2 07/11/2020   HGB 14.4 07/11/2020   HCT 43.3 07/11/2020   MCV 87.8 07/11/2020   PLT 217 07/11/2020   Lab Results  Component Value Date   NA 140 07/11/2020   K 3.7 07/11/2020   CO2 25 07/11/2020   GLUCOSE 113 (H) 07/11/2020   BUN 14 07/11/2020   CREATININE 1.92 (H) 07/11/2020    BILITOT 0.9 07/11/2020   ALKPHOS 59 07/11/2020   AST 21 07/11/2020   ALT 13 07/11/2020   PROT 7.0 07/11/2020   ALBUMIN 3.9 07/11/2020   CALCIUM 9.5 07/11/2020   ANIONGAP 10 07/11/2020   Lab Results  Component Value Date   CHOL 154 02/11/2020   Lab Results  Component Value Date   HDL 25 (L) 02/11/2020   Lab Results  Component Value Date   LDLCALC 98 02/11/2020   Lab Results  Component Value Date   TRIG 176 (H) 02/11/2020   Lab Results  Component Value Date   CHOLHDL 6.2 (H) 02/11/2020   Lab Results  Component Value Date   HGBA1C 5.5 09/08/2019      Assessment & Plan:   Problem List Items Addressed This Visit       Cardiovascular and Mediastinum   Hypertensive heart and chronic kidney disease with heart failure and stage 1 through stage 4 chronic kidney disease, or chronic kidney disease ([place]) - Primary Persistent  Continue to follow up with cardiology    Relevant Orders   Comp.  Metabolic Panel (12)   POCT urinalysis dipstick   Other forms of angina pectoris ([place]) Stable on current regimen     Other   Schizophrenia ([place]) Stable will continue to regimen      Other Visit Diagnoses     Screening for cholesterol level       Relevant Orders   Lipid panel   Encounter for hepatitis C screening test for low risk patient       Relevant Orders   Hepatitis C antibody       No orders of the defined types were placed in this encounter.   Follow-up: Return in about 6 months (around 03/23/2021).    Vevelyn Francois, NP

## 2020-09-20 NOTE — Patient Instructions (Addendum)
Health Maintenance, Male Adopting a healthy lifestyle and getting preventive care are important in promoting health and wellness. Ask your health care provider about:  The right schedule for you to have regular tests and exams.  Things you can do on your own to prevent diseases and keep yourself healthy. What should I know about diet, weight, and exercise? Eat a healthy diet  Eat a diet that includes plenty of vegetables, fruits, low-fat dairy products, and lean protein.  Do not eat a lot of foods that are high in solid fats, added sugars, or sodium.   Maintain a healthy weight Body mass index (BMI) is a measurement that can be used to identify possible weight problems. It estimates body fat based on height and weight. Your health care provider can help determine your BMI and help you achieve or maintain a healthy weight. Get regular exercise Get regular exercise. This is one of the most important things you can do for your health. Most adults should:  Exercise for at least 150 minutes each week. The exercise should increase your heart rate and make you sweat (moderate-intensity exercise).  Do strengthening exercises at least twice a week. This is in addition to the moderate-intensity exercise.  Spend less time sitting. Even light physical activity can be beneficial. Watch cholesterol and blood lipids Have your blood tested for lipids and cholesterol at 62 years of age, then have this test every 5 years. You may need to have your cholesterol levels checked more often if:  Your lipid or cholesterol levels are high.  You are older than 62 years of age.  You are at high risk for heart disease. What should I know about cancer screening? Many types of cancers can be detected early and may often be prevented. Depending on your health history and family history, you may need to have cancer screening at various ages. This may include screening for:  Colorectal cancer.  Prostate  cancer.  Skin cancer.  Lung cancer. What should I know about heart disease, diabetes, and high blood pressure? Blood pressure and heart disease  High blood pressure causes heart disease and increases the risk of stroke. This is more likely to develop in people who have high blood pressure readings, are of African descent, or are overweight.  Talk with your health care provider about your target blood pressure readings.  Have your blood pressure checked: ? Every 3-5 years if you are 18-39 years of age. ? Every year if you are 40 years old or older.  If you are between the ages of 65 and 75 and are a current or former smoker, ask your health care provider if you should have a one-time screening for abdominal aortic aneurysm (AAA). Diabetes Have regular diabetes screenings. This checks your fasting blood sugar level. Have the screening done:  Once every three years after age 45 if you are at a normal weight and have a low risk for diabetes.  More often and at a younger age if you are overweight or have a high risk for diabetes. What should I know about preventing infection? Hepatitis B If you have a higher risk for hepatitis B, you should be screened for this virus. Talk with your health care provider to find out if you are at risk for hepatitis B infection. Hepatitis C Blood testing is recommended for:  Everyone born from 1945 through 1965.  Anyone with known risk factors for hepatitis C. Sexually transmitted infections (STIs)  You should be screened each   year for STIs, including gonorrhea and chlamydia, if: ? You are sexually active and are younger than 62 years of age. ? You are older than 62 years of age and your health care provider tells you that you are at risk for this type of infection. ? Your sexual activity has changed since you were last screened, and you are at increased risk for chlamydia or gonorrhea. Ask your health care provider if you are at risk.  Ask your  health care provider about whether you are at high risk for HIV. Your health care provider may recommend a prescription medicine to help prevent HIV infection. If you choose to take medicine to prevent HIV, you should first get tested for HIV. You should then be tested every 3 months for as long as you are taking the medicine. Follow these instructions at home: Lifestyle  Do not use any products that contain nicotine or tobacco, such as cigarettes, e-cigarettes, and chewing tobacco. If you need help quitting, ask your health care provider.  Do not use street drugs.  Do not share needles.  Ask your health care provider for help if you need support or information about quitting drugs. Alcohol use  Do not drink alcohol if your health care provider tells you not to drink.  If you drink alcohol: ? Limit how much you have to 0-2 drinks a day. ? Be aware of how much alcohol is in your drink. In the [place] one drink equals one 12 oz bottle of beer (355 mL), one 5 oz glass of wine (148 mL), or one 1 oz glass of hard liquor (44 mL). General instructions  Schedule regular health, dental, and eye exams.  Stay current with your vaccines.  Tell your health care provider if: ? You often feel depressed. ? You have ever been abused or do not feel safe at home. Summary  Adopting a healthy lifestyle and getting preventive care are important in promoting health and wellness.  Follow your health care provider's instructions about healthy diet, exercising, and getting tested or screened for diseases.  Follow your health care provider's instructions on monitoring your cholesterol and blood pressure. This information is not intended to replace advice given to you by your health care provider. Make sure you discuss any questions you have with your health care provider. Document Revised: 04/17/2018 Document Reviewed: 04/17/2018 Elsevier Patient Education  2021 [employer]  Hypertension Hypertension, also called high blood pressure, is when the force of the blood pressing against the walls of the arteries is too strong. Arteries are blood vessels that carry blood from your heart throughout your body. Hypertension forces the heart to work harder to pump blood and may cause the arteries to become narrow or stiff. Understanding blood pressure readings Your personal target blood pressure may vary depending on your medical conditions, your age, and other factors. A blood pressure reading includes a higher number over a lower number. Ideally, your blood pressure should be below 120/80. You should know that:  The first, or top, number is called the systolic pressure. It is a measure of the pressure in your arteries as your heart beats.  The second, or bottom number, is called the diastolic pressure. It is a measure of the pressure in your arteries as the heart relaxes. Blood pressure is classified into four stages. Based on your blood pressure reading, your health care provider may use the following stages to determine what type of treatment you need, if any. Systolic  pressure and diastolic pressure are measured in a unit called mmHg. Normal  Systolic pressure: below 175.  Diastolic pressure: below 80. Elevated  Systolic pressure: 102-585.  Diastolic pressure: below 80. Hypertension stage 1  Systolic pressure: 277-824.  Diastolic pressure: 23-53. Hypertension stage 2  Systolic pressure: 614 or above.  Diastolic pressure: 90 or above. How can this condition affect me? Managing your hypertension is an important responsibility. Over time, hypertension can damage the arteries and decrease blood flow to important parts of the body, including the brain, heart, and kidneys. Having untreated or uncontrolled hypertension can lead to:  A heart attack.  A stroke.  A weakened blood vessel (aneurysm).  Heart failure.  Kidney damage.  Eye damage.  Metabolic  syndrome.  Memory and concentration problems.  Vascular dementia. What actions can I take to manage this condition? Hypertension can be managed by making lifestyle changes and possibly by taking medicines. Your health care provider will help you make a plan to bring your blood pressure within a normal range. Nutrition  Eat a diet that is high in fiber and potassium, and low in salt (sodium), added sugar, and fat. An example eating plan is called the Dietary Approaches to Stop Hypertension (DASH) diet. To eat this way: ? Eat plenty of fresh fruits and vegetables. Try to fill one-half of your plate at each meal with fruits and vegetables. ? Eat whole grains, such as whole-wheat pasta, brown rice, or whole-grain bread. Fill about one-fourth of your plate with whole grains. ? Eat low-fat dairy products. ? Avoid fatty cuts of meat, processed or cured meats, and poultry with skin. Fill about one-fourth of your plate with lean proteins such as fish, chicken without skin, beans, eggs, and tofu. ? Avoid pre-made and processed foods. These tend to be higher in sodium, added sugar, and fat.  Reduce your daily sodium intake. Most people with hypertension should eat less than 1,500 mg of sodium a day.   Lifestyle  Work with your health care provider to maintain a healthy body weight or to lose weight. Ask what an ideal weight is for you.  Get at least 30 minutes of exercise that causes your heart to beat faster (aerobic exercise) most days of the week. Activities may include walking, swimming, or biking.  Include exercise to strengthen your muscles (resistance exercise), such as weight lifting, as part of your weekly exercise routine. Try to do these types of exercises for 30 minutes at least 3 days a week.  Do not use any products that contain nicotine or tobacco, such as cigarettes, e-cigarettes, and chewing tobacco. If you need help quitting, ask your health care provider.  Control any long-term  (chronic) conditions you have, such as high cholesterol or diabetes.  Identify your sources of stress and find ways to manage stress. This may include meditation, deep breathing, or making time for fun activities.   Alcohol use  Do not drink alcohol if: ? Your health care provider tells you not to drink. ? You are pregnant, may be pregnant, or are planning to become pregnant.  If you drink alcohol: ? Limit how much you use to:  0-1 drink a day for women.  0-2 drinks a day for men. ? Be aware of how much alcohol is in your drink. In the [place] one drink equals one 12 oz bottle of beer (355 mL), one 5 oz glass of wine (148 mL), or one 1 oz glass of hard liquor (44 mL). Medicines Your health  care provider may prescribe medicine if lifestyle changes are not enough to get your blood pressure under control and if:  Your systolic blood pressure is 130 or higher.  Your diastolic blood pressure is 80 or higher. Take medicines only as told by your health care provider. Follow the directions carefully. Blood pressure medicines must be taken as told by your health care provider. The medicine does not work as well when you skip doses. Skipping doses also puts you at risk for problems. Monitoring Before you monitor your blood pressure:  Do not smoke, drink caffeinated beverages, or exercise within 30 minutes before taking a measurement.  Use the bathroom and empty your bladder (urinate).  Sit quietly for at least 5 minutes before taking measurements. Monitor your blood pressure at home as told by your health care provider. To do this:  Sit with your back straight and supported.  Place your feet flat on the floor. Do not cross your legs.  Support your arm on a flat surface, such as a table. Make sure your upper arm is at heart level.  Each time you measure, take two or three readings one minute apart and record the results. You may also need to have your blood pressure checked regularly by  your health care provider.   General information  Talk with your health care provider about your diet, exercise habits, and other lifestyle factors that may be contributing to hypertension.  Review all the medicines you take with your health care provider because there may be side effects or interactions.  Keep all visits as told by your health care provider. Your health care provider can help you create and adjust your plan for managing your high blood pressure. Where to find more information  National Heart, Lung, and Blood Institute: https://wilson-eaton.com/  American Heart Association: www.heart.org Contact a health care provider if:  You think you are having a reaction to medicines you have taken.  You have repeated (recurrent) headaches.  You feel dizzy.  You have swelling in your ankles.  You have trouble with your vision. Get help right away if:  You develop a severe headache or confusion.  You have unusual weakness or numbness, or you feel faint.  You have severe pain in your chest or abdomen.  You vomit repeatedly.  You have trouble breathing. These symptoms may represent a serious problem that is an emergency. Do not wait to see if the symptoms will go away. Get medical help right away. Call your local emergency services (911 in the [place] Do not drive yourself to the hospital. Summary  Hypertension is when the force of blood pumping through your arteries is too strong. If this condition is not controlled, it may put you at risk for serious complications.  Your personal target blood pressure may vary depending on your medical conditions, your age, and other factors. For most people, a normal blood pressure is less than 120/80.  Hypertension is managed by lifestyle changes, medicines, or both.  Lifestyle changes to help manage hypertension include losing weight, eating a healthy, low-sodium diet, exercising more, stopping smoking, and limiting alcohol. This information  is not intended to replace advice given to you by your health care provider. Make sure you discuss any questions you have with your health care provider. Document Revised: 05/30/2019 Document Reviewed: 03/25/2019 Elsevier Patient Education  2021 [employer].

## 2020-09-20 NOTE — Telephone Encounter (Signed)
Send to referral [facility] to except his insurance

## 2020-09-21 LAB — COMP. METABOLIC PANEL (12)
AST: 13 IU/L (ref 0–40)
Albumin/Globulin Ratio: 1.6 (ref 1.2–2.2)
Albumin: 4.1 g/dL (ref 3.8–4.8)
Alkaline Phosphatase: 67 IU/L (ref 44–121)
BUN/Creatinine Ratio: 8 — ABNORMAL LOW (ref 10–24)
BUN: 19 mg/dL (ref 8–27)
Bilirubin Total: 0.3 mg/dL (ref 0.0–1.2)
Calcium: 9.4 mg/dL (ref 8.6–10.2)
Chloride: 101 mmol/L (ref 96–106)
Creatinine, Ser: 2.24 mg/dL — ABNORMAL HIGH (ref 0.76–1.27)
Globulin, Total: 2.6 g/dL (ref 1.5–4.5)
Glucose: 93 mg/dL (ref 65–99)
Potassium: 5.6 mmol/L — ABNORMAL HIGH (ref 3.5–5.2)
Sodium: 137 mmol/L (ref 134–144)
Total Protein: 6.7 g/dL (ref 6.0–8.5)
eGFR: 33 mL/min/{1.73_m2} — ABNORMAL LOW (ref >59)

## 2020-09-21 LAB — LIPID PANEL
Chol/HDL Ratio: 5.7 ratio — ABNORMAL HIGH (ref 0.0–5.0)
Cholesterol, Total: 149 mg/dL (ref 100–199)
HDL: 26 mg/dL — ABNORMAL LOW (ref >39)
LDL Chol Calc (NIH): 101 mg/dL — ABNORMAL HIGH (ref 0–99)
Triglycerides: 119 mg/dL (ref 0–149)
VLDL Cholesterol Cal: 22 mg/dL (ref 5–40)

## 2020-09-21 LAB — HEPATITIS C ANTIBODY: Hep C Virus Ab: >11 s/co ratio — ABNORMAL HIGH (ref 0.0–0.9)

## 2020-09-22 ENCOUNTER — Encounter: Payer: Self-pay | Admitting: Nurse Practitioner

## 2020-09-22 DIAGNOSIS — I4892 Unspecified atrial flutter: Secondary | ICD-10-CM | POA: Insufficient documentation

## 2020-09-22 DIAGNOSIS — J449 Chronic obstructive pulmonary disease, unspecified: Secondary | ICD-10-CM | POA: Insufficient documentation

## 2020-09-22 DIAGNOSIS — Z86010 Personal history of colonic polyps: Secondary | ICD-10-CM | POA: Insufficient documentation

## 2020-09-22 DIAGNOSIS — K219 Gastro-esophageal reflux disease without esophagitis: Secondary | ICD-10-CM | POA: Insufficient documentation

## 2020-09-22 DIAGNOSIS — N184 Chronic kidney disease, stage 4 (severe): Secondary | ICD-10-CM | POA: Insufficient documentation

## 2020-09-22 DIAGNOSIS — K2270 Barrett's esophagus without dysplasia: Secondary | ICD-10-CM | POA: Insufficient documentation

## 2020-09-22 DIAGNOSIS — J301 Allergic rhinitis due to pollen: Secondary | ICD-10-CM | POA: Insufficient documentation

## 2020-09-22 DIAGNOSIS — R739 Hyperglycemia, unspecified: Secondary | ICD-10-CM | POA: Insufficient documentation

## 2020-09-22 DIAGNOSIS — F29 Unspecified psychosis not due to a substance or known physiological condition: Secondary | ICD-10-CM | POA: Insufficient documentation

## 2020-09-22 DIAGNOSIS — F250 Schizoaffective disorder, bipolar type: Secondary | ICD-10-CM | POA: Insufficient documentation

## 2020-09-22 DIAGNOSIS — E291 Testicular hypofunction: Secondary | ICD-10-CM | POA: Insufficient documentation

## 2020-09-22 DIAGNOSIS — R768 Other specified abnormal immunological findings in serum: Secondary | ICD-10-CM | POA: Insufficient documentation

## 2020-09-22 DIAGNOSIS — I447 Left bundle-branch block, unspecified: Secondary | ICD-10-CM | POA: Insufficient documentation

## 2020-09-22 DIAGNOSIS — K861 Other chronic pancreatitis: Secondary | ICD-10-CM | POA: Insufficient documentation

## 2020-09-22 DIAGNOSIS — I129 Hypertensive chronic kidney disease with stage 1 through stage 4 chronic kidney disease, or unspecified chronic kidney disease: Secondary | ICD-10-CM | POA: Insufficient documentation

## 2020-09-22 DIAGNOSIS — K259 Gastric ulcer, unspecified as acute or chronic, without hemorrhage or perforation: Secondary | ICD-10-CM | POA: Insufficient documentation

## 2020-09-22 DIAGNOSIS — N179 Acute kidney failure, unspecified: Secondary | ICD-10-CM | POA: Insufficient documentation

## 2020-09-22 DIAGNOSIS — J4489 Other specified chronic obstructive pulmonary disease: Secondary | ICD-10-CM | POA: Insufficient documentation

## 2020-09-22 DIAGNOSIS — N189 Chronic kidney disease, unspecified: Secondary | ICD-10-CM | POA: Insufficient documentation

## 2020-09-24 ENCOUNTER — Telehealth: Payer: Self-pay | Admitting: Nurse Practitioner

## 2020-09-27 ENCOUNTER — Ambulatory Visit (INDEPENDENT_AMBULATORY_CARE_PROVIDER_SITE_OTHER): Payer: PPO

## 2020-09-27 DIAGNOSIS — I441 Atrioventricular block, second degree: Secondary | ICD-10-CM | POA: Diagnosis not present

## 2020-09-27 NOTE — Telephone Encounter (Signed)
Done-sent to provider

## 2020-09-28 LAB — CUP PACEART REMOTE DEVICE CHECK
Battery Remaining Longevity: 93 mo
Battery Voltage: 3 V
Brady Statistic AP VP Percent: 0.18 %
Brady Statistic AP VS Percent: 93.42 %
Brady Statistic AS VP Percent: 0 %
Brady Statistic AS VS Percent: 6.39 %
Brady Statistic RA Percent Paced: 93.63 %
Brady Statistic RV Percent Paced: 0.19 %
Date Time Interrogation Session: 20220522212428
Implantable Lead Implant Date: 20181022
Implantable Lead Implant Date: 20181022
Implantable Lead Location: 753859
Implantable Lead Location: 753860
Implantable Lead Model: 3830
Implantable Lead Model: 5076
Implantable Pulse Generator Implant Date: 20181022
Lead Channel Impedance Value: 323 Ohm
Lead Channel Impedance Value: 361 Ohm
Lead Channel Impedance Value: 475 Ohm
Lead Channel Impedance Value: 475 Ohm
Lead Channel Pacing Threshold Amplitude: 0.625 V
Lead Channel Pacing Threshold Amplitude: 1 V
Lead Channel Pacing Threshold Pulse Width: 0.4 ms
Lead Channel Pacing Threshold Pulse Width: 0.4 ms
Lead Channel Sensing Intrinsic Amplitude: 3.375 mV
Lead Channel Sensing Intrinsic Amplitude: 3.375 mV
Lead Channel Sensing Intrinsic Amplitude: 9.625 mV
Lead Channel Sensing Intrinsic Amplitude: 9.625 mV
Lead Channel Setting Pacing Amplitude: 1.5 V
Lead Channel Setting Pacing Amplitude: 3.5 V
Lead Channel Setting Pacing Pulse Width: 1 ms
Lead Channel Setting Sensing Sensitivity: 1.2 mV

## 2020-09-30 ENCOUNTER — Telehealth: Payer: Self-pay

## 2020-09-30 NOTE — Telephone Encounter (Signed)
Peter Marsh can you please call this pt today about results? The wife called

## 2020-10-06 NOTE — Telephone Encounter (Signed)
Left voice mail to call back 

## 2020-10-08 NOTE — Telephone Encounter (Signed)
Patient received results.

## 2020-10-11 ENCOUNTER — Other Ambulatory Visit: Payer: Self-pay | Admitting: Nurse Practitioner

## 2020-10-11 DIAGNOSIS — N183 Chronic kidney disease, stage 3 unspecified: Secondary | ICD-10-CM

## 2020-10-11 NOTE — Progress Notes (Signed)
   [facility] [address] , [place]  [address] Phone:  [phone]   Fax:  [phone] 

## 2020-10-15 NOTE — Progress Notes (Signed)
Remote pacemaker transmission.   

## 2020-10-16 ENCOUNTER — Other Ambulatory Visit: Payer: Self-pay | Admitting: Internal Medicine

## 2020-10-18 ENCOUNTER — Other Ambulatory Visit: Payer: Self-pay

## 2020-10-18 ENCOUNTER — Ambulatory Visit (INDEPENDENT_AMBULATORY_CARE_PROVIDER_SITE_OTHER)
Admission: EM | Admit: 2020-10-18 | Discharge: 2020-10-19 | Disposition: A | Discharge status: Other Acute Inpatient Hospital | Payer: PPO | Source: Home / Self Care

## 2020-10-18 DIAGNOSIS — R0602 Shortness of breath: Secondary | ICD-10-CM | POA: Insufficient documentation

## 2020-10-18 DIAGNOSIS — R609 Edema, unspecified: Secondary | ICD-10-CM | POA: Diagnosis not present

## 2020-10-18 DIAGNOSIS — F419 Anxiety disorder, unspecified: Secondary | ICD-10-CM | POA: Insufficient documentation

## 2020-10-18 DIAGNOSIS — K56609 Unspecified intestinal obstruction, unspecified as to partial versus complete obstruction: Secondary | ICD-10-CM | POA: Diagnosis not present

## 2020-10-18 DIAGNOSIS — N179 Acute kidney failure, unspecified: Secondary | ICD-10-CM | POA: Diagnosis not present

## 2020-10-18 DIAGNOSIS — Z87891 Personal history of nicotine dependence: Secondary | ICD-10-CM | POA: Insufficient documentation

## 2020-10-18 DIAGNOSIS — K6389 Other specified diseases of intestine: Secondary | ICD-10-CM | POA: Diagnosis not present

## 2020-10-18 DIAGNOSIS — I129 Hypertensive chronic kidney disease with stage 1 through stage 4 chronic kidney disease, or unspecified chronic kidney disease: Secondary | ICD-10-CM | POA: Diagnosis not present

## 2020-10-18 DIAGNOSIS — R109 Unspecified abdominal pain: Secondary | ICD-10-CM | POA: Diagnosis not present

## 2020-10-18 DIAGNOSIS — I10 Essential (primary) hypertension: Secondary | ICD-10-CM | POA: Diagnosis not present

## 2020-10-18 DIAGNOSIS — F250 Schizoaffective disorder, bipolar type: Secondary | ICD-10-CM

## 2020-10-18 DIAGNOSIS — Z950 Presence of cardiac pacemaker: Secondary | ICD-10-CM | POA: Diagnosis not present

## 2020-10-18 DIAGNOSIS — E669 Obesity, unspecified: Secondary | ICD-10-CM | POA: Diagnosis not present

## 2020-10-18 DIAGNOSIS — K529 Noninfective gastroenteritis and colitis, unspecified: Secondary | ICD-10-CM | POA: Diagnosis not present

## 2020-10-18 DIAGNOSIS — R45850 Homicidal ideations: Secondary | ICD-10-CM | POA: Insufficient documentation

## 2020-10-18 DIAGNOSIS — Z888 Allergy status to other drugs, medicaments and biological substances status: Secondary | ICD-10-CM | POA: Diagnosis not present

## 2020-10-18 DIAGNOSIS — R079 Chest pain, unspecified: Secondary | ICD-10-CM | POA: Diagnosis not present

## 2020-10-18 DIAGNOSIS — N184 Chronic kidney disease, stage 4 (severe): Secondary | ICD-10-CM | POA: Diagnosis not present

## 2020-10-18 DIAGNOSIS — N132 Hydronephrosis with renal and ureteral calculous obstruction: Secondary | ICD-10-CM | POA: Diagnosis not present

## 2020-10-18 DIAGNOSIS — K920 Hematemesis: Secondary | ICD-10-CM | POA: Diagnosis not present

## 2020-10-18 DIAGNOSIS — I4892 Unspecified atrial flutter: Secondary | ICD-10-CM | POA: Diagnosis not present

## 2020-10-18 DIAGNOSIS — I517 Cardiomegaly: Secondary | ICD-10-CM | POA: Diagnosis not present

## 2020-10-18 DIAGNOSIS — I480 Paroxysmal atrial fibrillation: Secondary | ICD-10-CM | POA: Diagnosis not present

## 2020-10-18 DIAGNOSIS — R1111 Vomiting without nausea: Secondary | ICD-10-CM | POA: Diagnosis not present

## 2020-10-18 DIAGNOSIS — R059 Cough, unspecified: Secondary | ICD-10-CM | POA: Insufficient documentation

## 2020-10-18 DIAGNOSIS — U071 COVID-19: Secondary | ICD-10-CM | POA: Diagnosis not present

## 2020-10-18 DIAGNOSIS — R140 Abdominal distension (gaseous): Secondary | ICD-10-CM | POA: Diagnosis not present

## 2020-10-18 DIAGNOSIS — N200 Calculus of kidney: Secondary | ICD-10-CM | POA: Diagnosis not present

## 2020-10-18 DIAGNOSIS — Z820 Family history of epilepsy and other diseases of the nervous system: Secondary | ICD-10-CM | POA: Diagnosis not present

## 2020-10-18 DIAGNOSIS — M47816 Spondylosis without myelopathy or radiculopathy, lumbar region: Secondary | ICD-10-CM | POA: Diagnosis not present

## 2020-10-18 DIAGNOSIS — I878 Other specified disorders of veins: Secondary | ICD-10-CM | POA: Diagnosis not present

## 2020-10-18 DIAGNOSIS — R45851 Suicidal ideations: Secondary | ICD-10-CM

## 2020-10-18 DIAGNOSIS — E785 Hyperlipidemia, unspecified: Secondary | ICD-10-CM | POA: Diagnosis not present

## 2020-10-18 DIAGNOSIS — R7989 Other specified abnormal findings of blood chemistry: Secondary | ICD-10-CM | POA: Diagnosis not present

## 2020-10-18 DIAGNOSIS — K567 Ileus, unspecified: Secondary | ICD-10-CM | POA: Diagnosis not present

## 2020-10-18 DIAGNOSIS — I714 Abdominal aortic aneurysm, without rupture: Secondary | ICD-10-CM | POA: Diagnosis not present

## 2020-10-18 DIAGNOSIS — Z4682 Encounter for fitting and adjustment of non-vascular catheter: Secondary | ICD-10-CM | POA: Diagnosis not present

## 2020-10-18 DIAGNOSIS — E782 Mixed hyperlipidemia: Secondary | ICD-10-CM | POA: Diagnosis not present

## 2020-10-18 DIAGNOSIS — K219 Gastro-esophageal reflux disease without esophagitis: Secondary | ICD-10-CM | POA: Diagnosis not present

## 2020-10-18 DIAGNOSIS — F29 Unspecified psychosis not due to a substance or known physiological condition: Secondary | ICD-10-CM | POA: Diagnosis not present

## 2020-10-18 DIAGNOSIS — Z6828 Body mass index (BMI) 28.0-28.9, adult: Secondary | ICD-10-CM | POA: Diagnosis not present

## 2020-10-18 DIAGNOSIS — J449 Chronic obstructive pulmonary disease, unspecified: Secondary | ICD-10-CM | POA: Diagnosis not present

## 2020-10-18 LAB — POC SARS CORONAVIRUS 2 AG: SARSCOV2ONAVIRUS 2 AG: POSITIVE — AB

## 2020-10-18 LAB — POC SARS CORONAVIRUS 2 AG -  ED: SARS Coronavirus 2 Ag: POSITIVE — AB

## 2020-10-18 MED ORDER — ACETAMINOPHEN 325 MG PO TABS: 650.0000 mg | ORAL_TABLET | Freq: Four times a day (QID) | ORAL | Status: DC | PRN

## 2020-10-18 MED ORDER — ALUM & MAG HYDROXIDE-SIMETH 200-200-20 MG/5ML PO SUSP: 30.0000 mL | ORAL | Status: DC | PRN

## 2020-10-18 MED ORDER — MAGNESIUM HYDROXIDE 400 MG/5ML PO SUSP: 30.0000 mL | Freq: Every day | ORAL | Status: DC | PRN

## 2020-10-18 NOTE — BH Assessment (Signed)
Comprehensive Clinical Assessment (CCA) Note  10/18/2020 Peter Marsh [id number] Disposition: Patient was seen by this clinician and Peter Reasoner, NP.  Ene performed the MSE.  Pt recommended for inpatient psychiatric care.  Pt to be transported to [facility] for medical clearance.    Peter Marsh from 10/18/2020 in [facility] Marsh from 07/11/2020 in [place] CATEGORY High Risk Error: Question 2 not populated      The patient demonstrates the following risk factors for suicide: Chronic risk factors for suicide include: psychiatric disorder of schizoaffective d/o , medical illness stage 4 kidney disease, and other chronic problems. , demographic factors (male, >62 y/o), and completed suicide in a family member. Acute risk factors for suicide include: social withdrawal/isolation and loss (financial, interpersonal, professional). Protective factors for this patient include: positive therapeutic relationship. Considering these factors, the overall suicide risk at this point appears to be high. Patient is not appropriate for outpatient follow up.   Patient came to [facility] with complaints of hearing voices telling him to kill others then himself.  Pt has had problems with the neighbors.  Today he had gotten a knife from the kitchen and had thoughts of going to neighbors and "cutting loose."  Pt broke down crying on the floor and his wife brought him to [facility].  Pt says he has been having thoughts of hanging himself.  He hears voices telling him to do so.  Pt sometimes sees dead relatives beckoning to him from the grave.  Pt's voice is loud and he talks a lot about his physical medical issues.  Patient is oriented and has good eye contact.  Pt says he is hearing voices during interview.  Pt does not evidence any delusional thought process.  Pt reports losing about 60 lbs in last 6 months and getting less than 4 hours of sleep per  night during the same period.    Patient says he has Peter Grist at [place] for med management.  He sees her again in October.  Pt says her twice a year.  He cannot recall his last inpatient psychiatric hospitalization.     Chief Complaint: No chief complaint on file.  Visit Diagnosis: Schizoaffective d/o bipolar type.      CCA Screening, Triage and Referral (STR)  Patient Reported Information How did you hear about [place]? Family/Friend (Wife brought him to [facility])  What Is the Reason for Your Visit/Call Today? Patient says that his wife brought him because he has been hearing voices.  Today he was hearing the voices loudly telling him "kill them" over and over.  He had a knife in his hand and had thought about going over the the neighbors "and cutting loose, I didn't care who was there."  He has had some trouble with the neighbors in the past.  Pt has had thoughts today of hanging himself.  He has had attempts in the past.  Pt is hearing voices during assessment.  Pt says he sometimes sees dead relatives beckoning from beyond the grave.  Pt denies using ETOH or other drugs.  He did says he smoked some hemp a few weeks ago.  How Long Has This Been Causing You Problems? > than 6 months  What Do You Feel Would Help You the Most Today? Treatment for Depression or other mood problem   Have You Recently Had Any Thoughts About Hurting Yourself? Yes  Are You Planning to Commit  Suicide/Harm Yourself At This time? Yes (Plan to hang himself.)   Have you Recently Had Thoughts About [name]? Yes  Are You Planning to Harm Someone at This Time? No  Explanation: No data recorded  Have You Used Any Alcohol or Drugs in the Past 24 Hours? No  How Long Ago Did You Use Drugs or Alcohol? No data recorded What Did You Use and How Much? No data recorded  Do You Currently Have a Therapist/Psychiatrist? No data recorded Name of Therapist/Psychiatrist: No data recorded  Have You  Been Recently Discharged From Any Office Practice or Programs? No data recorded Explanation of Discharge From Practice/Program: No data recorded    CCA Screening Triage Referral Assessment Type of Contact: No data recorded Telemedicine Service Delivery:   Is this Initial or Reassessment? No data recorded Date Telepsych consult ordered in [facility]:  No data recorded Time Telepsych consult ordered in [facility]:  No data recorded Location of Assessment: No data recorded Provider Location: No data recorded  Collateral Involvement: No data recorded  Does Patient Have a [place]? No data recorded Name and Contact of Legal Guardian: No data recorded If Minor and Not Living with Parent(s), Who has Custody? No data recorded Is CPS involved or ever been involved? No data recorded Is APS involved or ever been involved? No data recorded  Patient Determined To Be At Risk for Harm To Self or Others Based on Review of Patient Reported Information or Presenting Complaint? No data recorded Method: No data recorded Availability of Means: No data recorded Intent: No data recorded Notification Required: No data recorded Additional Information for Danger to Others Potential: No data recorded Additional Comments for Danger to Others Potential: No data recorded Are There Guns or Other Weapons in Your Home? No data recorded Types of Guns/Weapons: No data recorded Are These Weapons Safely Secured?                            No data recorded Who Could Verify You Are Able To Have These Secured: No data recorded Do You Have any Outstanding Charges, Pending Court Dates, Parole/Probation? No data recorded Contacted To Inform of Risk of Harm To Self or Others: No data recorded   Does Patient Present under Involuntary Commitment? No data recorded IVC Papers Initial File Date: No data recorded  [place] of Residence: No data recorded  Patient Currently Receiving the Following Services: No data  recorded  Determination of Need: Emergent (2 hours)   Options For Referral: Inpatient Hospitalization     CCA Biopsychosocial Patient Reported Schizophrenia/Schizoaffective Diagnosis in Past: Yes   Strengths: Pt is able to communicate his wants, needs & desires.  Pt has a good memory.   Mental Health Symptoms Depression:   Change in energy/activity; Sleep (too much or little); Increase/decrease in appetite; Hopelessness; Worthlessness; Weight gain/loss; Difficulty Concentrating; Tearfulness   Duration of Depressive symptoms:  Duration of Depressive Symptoms: Greater than two weeks   Mania:   Irritability; Increased Energy   Anxiety:    Difficulty concentrating; Worrying; Tension; Sleep; Restlessness   Psychosis:   Hallucinations   Duration of Psychotic symptoms:  Duration of Psychotic Symptoms: Greater than six months   Trauma:   None   Obsessions:   None   Compulsions:   None   Inattention:   None   Hyperactivity/Impulsivity:   None   Oppositional/Defiant Behaviors:   None   Emotional Irregularity:   Intense/inappropriate  anger; Potentially harmful impulsivity   Other Mood/Personality Symptoms:  No data recorded   Mental Status Exam Appearance and self-care  Stature:   Average   Weight:   Overweight   Clothing:   Disheveled   Grooming:   Neglected   Cosmetic use:   None   Posture/gait:   Normal   Motor activity:   Not Remarkable   Sensorium  Attention:   Distractible   Concentration:   Anxiety interferes   Orientation:   X5   Recall/memory:   Normal   Affect and Mood  Affect:   Anxious; Depressed   Mood:   Anxious; Hopeless; Depressed   Relating  Eye contact:   Normal   Facial expression:   Anxious; Depressed   Attitude toward examiner:   Cooperative (Loud voice)   Thought and Language  Speech flow:  Loud; Clear and Coherent   Thought content:   Appropriate to Mood and Circumstances    Preoccupation:   Somatic   Hallucinations:   Auditory; Visual   Organization:  No data recorded  [employer] of Knowledge:   Average   Intelligence:   Average   Abstraction:   Normal   Judgement:   Poor   Reality Testing:   Adequate   Insight:   Fair   Decision Making:   Impulsive   Social Functioning  Social Maturity:   Impulsive   Social Judgement:   Normal   Stress  Stressors:   Grief/losses; Illness   Coping Ability:   Overwhelmed; Deficient supports   Skill Deficits:   Decision making   Supports:   Family     Religion:    Leisure/Recreation:    Exercise/Diet: Exercise/Diet Have You Gained or Lost A Significant Amount of Weight in the Past Six Months?: Yes-Lost Number of Pounds Lost?: 60 (60 lbs in last 6 months.) Do You Have Any Trouble Sleeping?: Yes Explanation of Sleeping Difficulties: Reports <4H/D for months.   CCA Employment/Education Employment/Work Situation: Employment / Work Technical sales engineer: On disability Why is Patient on Disability: mental issues How Long has Patient Been on Disability: over 30 years Has Patient ever Been in the [employer]?: No  Education:     CCA Family/Childhood History Family and Relationship History: Family history Marital status: Married Does patient have children?: Yes How many children?: 1 How is patient's relationship with their children?: Has not seen son in years due to mental illness.  Childhood History:     Child/Adolescent Assessment:     CCA Substance Use Alcohol/Drug Use: Alcohol / Drug Use Pain Medications: See PTA medication list Prescriptions: Pt says he takes oral Invega and Welbutrin Over the Counter: Unknown History of alcohol / drug use?: No history of alcohol / drug abuse (Years ago.) Negative Consequences of Use: Legal                         ASAM's:  Six Dimensions of Multidimensional Assessment  Dimension 1:  Acute  Intoxication and/or Withdrawal Potential:      Dimension 2:  Biomedical Conditions and Complications:      Dimension 3:  Emotional, Behavioral, or Cognitive Conditions and Complications:     Dimension 4:  Readiness to Change:     Dimension 5:  Relapse, Continued use, or Continued Problem Potential:     Dimension 6:  Recovery/Living Environment:     ASAM Severity Score:    ASAM Recommended Level of Treatment:  Substance use Disorder (SUD)    Recommendations for Services/Supports/Treatments:    Discharge Disposition:    DSM5 Diagnoses: Patient Active Problem List   Diagnosis Date Noted   Allergic rhinitis due to pollen 09/22/2020   Chronic obstructive pulmonary disease, unspecified ([place]) 09/22/2020   Chronic pancreatitis ([place]) 09/22/2020   Gastric ulcer, unspecified as acute or chronic, without hemorrhage or perforation 09/22/2020   History of adenomatous polyp of colon 09/22/2020   Hyperglycemia 09/22/2020   Left bundle branch block 09/22/2020   Schizoaffective disorder, bipolar type ([place]) 09/22/2020   Testicular hypofunction 09/22/2020   Chronic kidney disease due to hypertension 09/22/2020   Chronic renal failure syndrome 09/22/2020   Barrett's esophagus 09/22/2020   Chronic GERD 09/22/2020   Psychotic disorder ([place]) 09/22/2020   Atrial flutter ([place]) 09/22/2020   Hepatitis C antibody positive in blood 09/22/2020   Hypertensive heart and chronic kidney disease with heart failure and stage 1 through stage 4 chronic kidney disease, or chronic kidney disease ([place]) 09/20/2020   Gait abnormality 10/22/2019   Vitamin B12 deficiency 10/22/2019   Idiopathic peripheral neuropathy 10/22/2019   Paresthesia 09/08/2019   Mobitz type 2 second degree AV block 04/06/2019   Encounter for care of pacemaker 02/13/2019   Sinus node dysfunction ([place]) 02/13/2019   Encephalopathy 11/19/2018   GERD (gastroesophageal reflux disease) 11/19/2018   Other forms of angina pectoris ([place])  06/24/2018   Essential hypertension 06/24/2018   Wide QRS ventricular tachycardia ([place]) 02/26/2017   Pacemaker: Medtronic Azure XT DR MRI [id number] PPM -  BUNDLE OF HIS pacing  02/26/2017   CKD (chronic kidney disease), stage III ([place]) 09/16/2016   Schizophrenia ([place]) 09/16/2016   Upper GI bleed 09/16/2016   Asthma 02/09/2016   Herniated lumbar intervertebral disc 04/20/2011   Hyperlipidemia with target LDL less than 130 04/20/2009   Gastroparesis 11/24/2008   History of tobacco abuse 08/14/2008   Low back pain 11/15/2007     Referrals to Alternative Service(s): Referred to Alternative Service(s):   Place:   Date:   Time:    Referred to Alternative Service(s):   Place:   Date:   Time:    Referred to Alternative Service(s):   Place:   Date:   Time:    Referred to Alternative Service(s):   Place:   Date:   Time:     [place] Session

## 2020-10-18 NOTE — ED Provider Notes (Signed)
Behavioral Health Admission [facility]&P [facility] & OBS)  Date: 10/18/20 Patient Name: Peter Marsh MRN: [id number] Chief Complaint: No chief complaint on file.     Diagnoses:  Final diagnoses:  None    HPI: Peter Marsh is a 60y/o male with psychiatric history of Schizoaffective disorder, bipolar type and depression. Patient presented voluntarily to [facility] accompanied by his wife Debbi Leiber.  Patient was assessed by this NP. Patient is alert and oriented X4, irritable, agitated but cooperative, speech is normal rate, volumn is increased. Patient's mood is anxious and depressed, his mood is congruent with his affect. He is noted to be mildly SOB with active, he has active nonproductive cough, he denies fever, chills, fatigue, recent illness or sick contact. Patient COVID was positive.   Patient presented with chief complaint of auditory hallucination, worsening depression, suicidal ideation, and homicidal ideation. Patient report that he is depressed due to "failing health." He reports that he diagnosed with stage 4 renal disease and was recently informed that he will need dialysis.   He reports that he is "stressed out" due to his neighbors always bothering him. He states "I'm tired of their shit and I'm ready to blow off their heads." He reports that his neighbors are constantly knocking on his apartment walls to turn down his T.V volume, threatening to "fuck me up", and selling drugs in their apartment building. He reports that he started experiencing auditory hallucination commanding him to harm his neighbors and to commit suicide. He reports hearing voices telling him to "take a knife to kill and blow off the heads of my neighbors." He admits to having homicidal ideations towards his neighbor [place], Karmen Bongo, and their son (he doesn't know the name of their son).   Patient reports that he is unable to contract for safety due to worsening depression, suicidal thoughts and homicidal ideation. He is  endorsing auditory hallucinations of hearing multiple voices. He admits to hearing the voice his dead mother encouraging him to end his life. He states "I hear my mother's voice saying honey I love you, you will be with me soon." He reports hearing voice of his dead brother  Saying "kill kill kill", the voice of chain gain leader saying "get your mind right" and an  unknown voice saying "kill them" referring to his neighbors.   Patient admits to visual hallucination of seeing the ghost of dead family members and "my brother as a bird that sings to me." He report that he feels paranoid. He believes that his neighbors are plotting to kill him because he is the "only white guy in the neighborhood." He denies current alcohol and drug abuse. He report past hx of cocaine use last use in 1982. He reports that he was incarcerated from age [age] for selling drugs and has been admitted to multiple inpatient psychiatric hospital in the past [place] 2-9:   [place] ED from 10/18/2020 in [facility] ED from 07/11/2020 in [place] High Risk Error: Question 2 not populated        Total Time spent with patient: 45 minutes  Musculoskeletal  Strength & Muscle Tone: within normal limits Gait & Station: normal Patient leans: Right  Psychiatric Specialty Exam  Presentation General Appearance: Appropriate for Environment  Eye Contact:Good  Speech:Clear and Coherent  Speech Volume:Increased  Handedness:Right   Mood and Affect  Mood:Euthymic  Affect:Congruent   Thought Process  Thought Processes:Coherent  Descriptions of  Associations:Intact  Orientation:Full (Time, Place and Person)  Thought Content:WDL    Hallucinations:Hallucinations: None  Ideas of Reference:None  Suicidal Thoughts:Suicidal Thoughts: Yes, Active SI Active Intent and/or Plan: With Plan  Homicidal Thoughts:Homicidal Thoughts: Yes,  Active HI Active Intent and/or Plan: With Plan   Sensorium  Memory:Immediate Good; Recent Good; Remote Good  Judgment:Poor  Insight:Good   Executive Functions  Concentration:Good  Attention Span:Good  Recall:Good  Fund of Knowledge:Good  Language:Good   Psychomotor Activity  Psychomotor Activity:Psychomotor Activity: Normal   Assets  Assets:Desire for Improvement; Armed forces logistics/support/administrative officer; Housing; Intimacy; Social Support; Transportation   Sleep  Sleep:Sleep: Poor Number of Hours of Sleep: 2   Nutritional Assessment (For OBS and FBC admissions only) Has the patient had a weight loss or gain of 10 pounds or more in the last 3 months?: No Has the patient had a decrease in food intake/or appetite?: No Does the patient have dental problems?: No Does the patient have eating habits or behaviors that may be indicators of an eating disorder including binging or inducing vomiting?: No Has the patient recently lost weight without trying?: No Has the patient been eating poorly because of a decreased appetite?: No Malnutrition Screening Tool Score: 0    Physical Exam Vitals and nursing note reviewed.  Constitutional:      Appearance: He is well-developed.  HENT:     Head: Normocephalic and atraumatic.  Eyes:     General:        Right eye: No discharge.        Left eye: No discharge.  Cardiovascular:     Rate and Rhythm: Normal rate.     Heart sounds: No murmur heard. Pulmonary:     Effort: Pulmonary effort is normal. No respiratory distress.     Breath sounds: Normal breath sounds.  Abdominal:     Palpations: Abdomen is soft.     Tenderness: There is no abdominal tenderness.  Musculoskeletal:        General: Normal range of motion.     Cervical back: Neck supple.  Skin:    General: Skin is dry.  Neurological:     Mental Status: He is alert.  Psychiatric:        Attention and Perception: He perceives auditory and visual hallucinations.        Mood and  Affect: Mood is depressed.        Speech: Speech normal.        Behavior: Behavior is agitated. Behavior is cooperative.        Thought Content: Thought content is paranoid. Thought content includes homicidal and suicidal ideation. Thought content includes homicidal and suicidal plan.        Cognition and Memory: Cognition normal.   Review of Systems  Constitutional: Negative.   HENT: Negative.    Eyes: Negative.   Respiratory:  Positive for cough and shortness of breath.   Cardiovascular: Negative.   Gastrointestinal: Negative.   Genitourinary:  Positive for frequency (patient report that he uses diuretic).  Musculoskeletal: Negative.   Neurological: Negative.   Endo/Heme/Allergies: Negative.   Psychiatric/Behavioral:  Positive for depression, hallucinations and suicidal ideas. The patient is nervous/anxious.    Blood pressure (!) 182/112, pulse 75, temperature 97.6 F (36.4 C), temperature source Oral, resp. rate 20, SpO2 98 %. There is no height or weight on file to calculate BMI.  Past Psychiatric History: schizoaffective disorder, bipolar type, depression,    Is the patient at risk to self? Yes  Has the patient  been a risk to self in the past 6 months? No .    Has the patient been a risk to self within the distant past? Yes   Is the patient a risk to others? Yes   Has the patient been a risk to others in the past 6 months? No   Has the patient been a risk to others within the distant past? No   Past Medical History:  Past Medical History:  Diagnosis Date   Allergy    Anxiety    Asthma    uses inhalers    Bilateral carotid bruits    Cardiac conduction disorder 2018   s/p MDT PPM   Cataract    Chronic kidney disease    bladder interstial cystitis    Chronic kidney disease (CKD), stage IV (severe) ([place])    followed by Dr. Joelyn Oms at [facility]   COPD (chronic obstructive pulmonary disease) ([place])    Depression    Encounter for care of pacemaker 02/13/2019   GERD  (gastroesophageal reflux disease)    History of stomach ulcers 2001   Hypertension    LBBB (left bundle branch block)    Lower extremity edema    Mild intermittent asthma without complication    Mixed hyperlipidemia    Mobitz type 2 second degree AV block 04/06/2019   Neuropathy    Pacemaker: Medtronic Azure XT DR MRI [id number] PPM -  BUNDLE OF HIS pacing  02/26/2017   Scheduled Remote pacemaker check  11/12/2018:  There were 24 Fast AV episodes:  EGMs show SVTs. Episodes lasted < 2 minutes. Health trends do not demonstrate significant abnormality. Battery longevity is 9.4 - 10.3 years. RA pacing is 47.1 %, RV pacing is 40.4 %.  Clinic check 11/07/17.    Paroxysmal atrial flutter ([name])    PONV (postoperative nausea and vomiting)    Prostatitis    Recurrent upper respiratory infection (URI)    Schizophrenia ([name])    Sinus node dysfunction ([place]) 02/13/2019   Urticaria     Past Surgical History:  Procedure Laterality Date   ADENOIDECTOMY     APPENDECTOMY     COLONOSCOPY     ELECTROPHYSIOLOGY STUDY N/A 02/26/2017   Procedure: ELECTROPHYSIOLOGY STUDY;  Surgeon: Evans Lance, MD;  Location: [place] [facility];  Service: Cardiovascular;  Laterality: N/A;   ESOPHAGOGASTRODUODENOSCOPY (EGD) WITH PROPOFOL N/A 09/18/2016   Procedure: ESOPHAGOGASTRODUODENOSCOPY (EGD) WITH PROPOFOL;  Surgeon: Ronnette Juniper, MD;  Location: [place];  Service: Gastroenterology;  Laterality: N/A;   LUMBAR LAMINECTOMY/DECOMPRESSION MICRODISCECTOMY  04/19/2011   Procedure: LUMBAR LAMINECTOMY/DECOMPRESSION MICRODISCECTOMY;  Surgeon: Tobi Bastos;  Location: [facility];  Service: Orthopedics;  Laterality: Left;  Hemi LAminectomy/Microdiscectomy Lumbar four  - Lumbar five  on the Left (X-Ray)   PACEMAKER IMPLANT N/A 02/26/2017   Procedure: PACEMAKER IMPLANT;  Surgeon: Evans Lance, MD;  Location: [place] [facility];  Service: Cardiovascular;  Laterality: N/A;   TONSILLECTOMY     UPPER GASTROINTESTINAL ENDOSCOPY       Family History:  Family History  Problem Relation Age of Onset   High blood pressure Mother    Alzheimer's disease Father    Colon cancer Neg Hx    Esophageal cancer Neg Hx    Rectal cancer Neg Hx    Stomach cancer Neg Hx     Social History:  Social History   Socioeconomic History   Marital status: Married    Spouse name: Not on file   Number of children: 1  Years of education: 18   Highest education level: High school graduate  Occupational History   Occupation: Retired  Tobacco Use   Smoking status: Former    Packs/day: 1.00    Years: 30.00    Pack years: 30.00    Types: Cigarettes    Quit date: 05/08/2008    Years since quitting: 12.4   Smokeless tobacco: Never  Vaping Use   Vaping Use: Never used  Substance and Sexual Activity   Alcohol use: No   Drug use: No   Sexual activity: Never  Other Topics Concern   Not on file  Social History Narrative   Lives at home with wife.   Right-handed.   One cup caffeine per day.   Social Determinants of Health   Financial Resource Strain: Not on file  Food Insecurity: Not on file  Transportation Needs: Not on file  Physical Activity: Not on file  Stress: Not on file  Social Connections: Not on file  Intimate Partner Violence: Not on file    SDOH:  SDOH Screenings   Alcohol Screen: Not on file  Depression (PHQ2-9): Low Risk    PHQ-2 Score: 0  Financial Resource Strain: Not on file  Food Insecurity: Not on file  Housing: Not on file  Physical Activity: Not on file  Social Connections: Not on file  Stress: Not on file  Tobacco Use: Medium Risk   Smoking Tobacco Use: Former   Smokeless Tobacco Use: Never  Transportation Needs: Not on file    Last Labs:  Clinical Support on 09/27/2020  Component Date Value Ref Range Status   Date Time Interrogation Session 09/26/2020 [id number]   Final   Pulse Generator Manufacturer 09/26/2020 MERM   Final   Pulse Gen Model 09/26/2020 W1DR01 Azure XT DR MRI    Final   Pulse Gen Serial Number 09/26/2020 [id number]   Final   Clinic Name 09/26/2020 [facility]   Final   Implantable Pulse Generator Type 09/26/2020 Implantable Pulse Generator   Final   Implantable Pulse Generator Implan* 09/26/2020 [id number]   Final   Implantable Lead Manufacturer 09/26/2020 [facility]   Final   Implantable Lead Model 09/26/2020 3830 SelectSecure   Final   Implantable Lead Serial Number 09/26/2020 [id number]   Final   Implantable Lead Implant Date 09/26/2020 [id number]   Final   Implantable Lead Location Detail 1 09/26/2020 UNKNOWN   Final   Implantable Lead Special Function 09/26/2020 BUNDLE OF HIS   Final   Implantable Lead Location 09/26/2020 [id number]   Final   Implantable Lead Manufacturer 09/26/2020 MERM   Final   Implantable Lead Model 09/26/2020 5076 CapSureFix Novus MRI SureScan   Final   Implantable Lead Serial Number 09/26/2020 [id number]   Final   Implantable Lead Implant Date 09/26/2020 [id number]   Final   Implantable Lead Location Detail 1 09/26/2020 APPENDAGE   Final   Implantable Lead Location 09/26/2020 [id number]   Final   Lead Channel Setting Sensing Sensi* 09/26/2020 1.2  mV Final   Lead Channel Setting Pacing Amplit* 09/26/2020 1.5  V Final   Lead Channel Setting Pacing Pulse * 09/26/2020 1  ms Final   Lead Channel Setting Pacing Amplit* 09/26/2020 3.5  V Final   Lead Channel Impedance Value 09/26/2020 475  ohm Final   Lead Channel Impedance Value 09/26/2020 361  ohm Final   Lead Channel Sensing Intrinsic Amp* 09/26/2020 3.375  mV Final   Lead Channel Sensing Intrinsic Amp* 09/26/2020 3.375  mV Final   Lead  Channel Pacing Threshold Ampl* 09/26/2020 0.625  V Final   Lead Channel Pacing Threshold Puls* 09/26/2020 0.4  ms Final   Lead Channel Impedance Value 09/26/2020 475  ohm Final   Lead Channel Impedance Value 09/26/2020 323  ohm Final   Lead Channel Sensing Intrinsic Amp* 09/26/2020 9.625  mV Final   Lead Channel Sensing Intrinsic Amp* 09/26/2020 9.625   mV Final   Lead Channel Pacing Threshold Ampl* 09/26/2020 1  V Final   Lead Channel Pacing Threshold Puls* 09/26/2020 0.4  ms Final   Battery Status 09/26/2020 OK   Final   Battery Remaining Longevity 09/26/2020 93  mo Final   Battery Voltage 09/26/2020 3.00  V Final   Brady Statistic RA Percent Paced 09/26/2020 93.63  % Final   Brady Statistic RV Percent Paced 09/26/2020 0.19  % Final   Brady Statistic AP VP Percent 09/26/2020 0.18  % Final   Brady Statistic AS VP Percent 09/26/2020 0  % Final   Brady Statistic AP VS Percent 09/26/2020 93.42  % Final   Brady Statistic AS VS Percent 09/26/2020 6.39  % Final  Office Visit on 09/20/2020  Component Date Value Ref Range Status   Cholesterol, Total 09/20/2020 149  100 - 199 mg/dL Final   Triglycerides 09/20/2020 119  0 - 149 mg/dL Final   HDL 09/20/2020 26 (A) >39 mg/dL Final   VLDL Cholesterol Cal 09/20/2020 22  5 - 40 mg/dL Final   LDL Chol Calc (NIH) 09/20/2020 101 (A) 0 - 99 mg/dL Final   Chol/HDL Ratio 09/20/2020 5.7 (A) 0.0 - 5.0 ratio Final   Comment:                                   T. Chol/HDL Ratio                                             Men  Women                               1/2 Avg.Risk  3.4    3.3                                   Avg.Risk  5.0    4.4                                2X Avg.Risk  9.6    7.1                                3X Avg.Risk 23.4   11.0    Glucose 09/20/2020 93  65 - 99 mg/dL Final   BUN 09/20/2020 19  8 - 27 mg/dL Final   Creatinine, Ser 09/20/2020 2.24 (A) 0.76 - 1.27 mg/dL Final   eGFR 09/20/2020 33 (A) >59 mL/min/1.73 Final   BUN/Creatinine Ratio 09/20/2020 8 (A) 10 - 24 Final   Sodium 09/20/2020 137  134 - 144 mmol/L Final   Potassium 09/20/2020 5.6 (A) 3.5 - 5.2 mmol/L Final   Chloride 09/20/2020 101  96 - 106 mmol/L Final   Calcium 09/20/2020 9.4  8.6 - 10.2 mg/dL Final   Total Protein 09/20/2020 6.7  6.0 - 8.5 g/dL Final   Albumin 09/20/2020 4.1  3.8 - 4.8 g/dL Final   Globulin,  Total 09/20/2020 2.6  1.5 - 4.5 g/dL Final   Albumin/Globulin Ratio 09/20/2020 1.6  1.2 - 2.2 Final   Bilirubin Total 09/20/2020 0.3  0.0 - 1.2 mg/dL Final   Alkaline Phosphatase 09/20/2020 67  44 - 121 IU/L Final   AST 09/20/2020 13  0 - 40 IU/L Final   Glucose, UA 09/20/2020 Negative  Negative Final   Bilirubin, UA 09/20/2020 neg   Final   Ketones, UA 09/20/2020 neg   Final   Spec Grav, UA 09/20/2020 1.025  1.010 - 1.025 Final   Blood, UA 09/20/2020 trace   Final   pH, UA 09/20/2020 6.0  5.0 - 8.0 Final   Protein, UA 09/20/2020 Positive (A) Negative Final   Urobilinogen, UA 09/20/2020 0.2  0.2 or 1.0 E.U./dL Final   Nitrite, UA 09/20/2020 neg   Final   Leukocytes, UA 09/20/2020 Negative  Negative Final   Hep C Virus Ab 09/20/2020 >11.0 (A) 0.0 - 0.9 s/co ratio Final   Comment:                                   Negative:     < 0.8                              Indeterminate: 0.8 - 0.9                                   Positive:     > 0.9  The CDC recommends that a positive HCV antibody result  be followed up with a HCV Nucleic Acid Amplification  test (242353).   Admission on 07/11/2020, Discharged on 07/11/2020  Component Date Value Ref Range Status   Lipase 07/11/2020 26  11 - 51 U/L Final   Performed at [facility], [place] [address], [place], [place] [address]   Sodium 07/11/2020 140  135 - 145 mmol/L Final   Potassium 07/11/2020 3.7  3.5 - 5.1 mmol/L Final   Chloride 07/11/2020 105  98 - 111 mmol/L Final   CO2 07/11/2020 25  22 - 32 mmol/L Final   Glucose, Bld 07/11/2020 113 (A) 70 - 99 mg/dL Final   Glucose reference range applies only to samples taken after fasting for at least 8 hours.   BUN 07/11/2020 14  8 - 23 mg/dL Final   Creatinine, Ser 07/11/2020 1.92 (A) 0.61 - 1.24 mg/dL Final   Calcium 07/11/2020 9.5  8.9 - 10.3 mg/dL Final   Total Protein 07/11/2020 7.0  6.5 - 8.1 g/dL Final   Albumin 07/11/2020 3.9  3.5 - 5.0 g/dL Final   AST 07/11/2020 21  15 - 41 U/L  Final   ALT 07/11/2020 13  0 - 44 U/L Final   Alkaline Phosphatase 07/11/2020 59  38 - 126 U/L Final   Total Bilirubin 07/11/2020 0.9  0.3 - 1.2 mg/dL Final   GFR, Estimated 07/11/2020 39 (A) >60 mL/min Final   Comment: (NOTE) Calculated using the CKD-EPI Creatinine Equation (2021)    Anion gap 07/11/2020 10  5 -  15 Final   Performed at [facility], [place] [address] [place], [place] [address]   WBC 07/11/2020 7.2  4.0 - 10.5 K/uL Final   RBC 07/11/2020 4.93  4.22 - 5.81 MIL/uL Final   Hemoglobin 07/11/2020 14.4  13.0 - 17.0 g/dL Final   HCT 07/11/2020 43.3  39.0 - 52.0 % Final   MCV 07/11/2020 87.8  80.0 - 100.0 fL Final   MCH 07/11/2020 29.2  26.0 - 34.0 pg Final   MCHC 07/11/2020 33.3  30.0 - 36.0 g/dL Final   RDW 07/11/2020 13.2  11.5 - 15.5 % Final   Platelets 07/11/2020 217  150 - 400 K/uL Final   nRBC 07/11/2020 0.0  0.0 - 0.2 % Final   Performed at [facility] [address] [place], [place] [address]   Color, Urine 07/11/2020 YELLOW  YELLOW Final   APPearance 07/11/2020 CLEAR  CLEAR Final   Specific Gravity, Urine 07/11/2020 1.023  1.005 - 1.030 Final   pH 07/11/2020 6.0  5.0 - 8.0 Final   Glucose, UA 07/11/2020 NEGATIVE  NEGATIVE mg/dL Final   Hgb urine dipstick 07/11/2020 NEGATIVE  NEGATIVE Final   Bilirubin Urine 07/11/2020 NEGATIVE  NEGATIVE Final   Ketones, ur 07/11/2020 NEGATIVE  NEGATIVE mg/dL Final   Protein, ur 07/11/2020 100 (A) NEGATIVE mg/dL Final   Nitrite 07/11/2020 NEGATIVE  NEGATIVE Final   Leukocytes,Ua 07/11/2020 NEGATIVE  NEGATIVE Final   RBC / HPF 07/11/2020 0-5  0 - 5 RBC/hpf Final   WBC, UA 07/11/2020 0-5  0 - 5 WBC/hpf Final   Bacteria, UA 07/11/2020 NONE SEEN  NONE SEEN Final   Mucus 07/11/2020 PRESENT   Final   Ca Oxalate Crys, UA 07/11/2020 PRESENT   Final   Performed at [facility], [address] [place], [place] [address]   Troponin I (High Sensitivity) 07/11/2020 10  <18 ng/L Final   Comment: (NOTE) Elevated high  sensitivity troponin I (hsTnI) values and significant  changes across serial measurements may suggest ACS but many other  chronic and acute conditions are known to elevate hsTnI results.  Refer to the "Links" section for chest pain algorithms and additional  guidance. Performed at [facility], [place] [address] [place], [place] [address]    SARS Coronavirus 2 Ag 07/11/2020 NEGATIVE  NEGATIVE Final   Comment: (NOTE) SARS-CoV-2 antigen NOT DETECTED.   Negative results are presumptive.  Negative results do not preclude SARS-CoV-2 infection and should not be used as the sole basis for treatment or other patient management decisions, including infection  control decisions, particularly in the presence of clinical signs and  symptoms consistent with COVID-19, or in those who have been in contact with the virus.  Negative results must be combined with clinical observations, patient history, and epidemiological information. The expected result is Negative.  Fact Sheet for Patients: HandmadeRecipes.com.cy  Fact Sheet for Healthcare Providers: FuneralLife.at  This test is not yet approved or cleared by the [place] FDA and  has been authorized for detection and/or diagnosis of SARS-CoV-2 by FDA under an Emergency Use Authorization (EUA).  This EUA will remain in effect (meaning this test can be used) for the duration of  the COV                          ID-19 declaration under Section 564(b)(1) of the Act, 21 U.S.C. section 360bbb-3(b)(1), unless the authorization is terminated or revoked sooner.  SARS Coronavirus 2 07/11/2020 NEGATIVE  NEGATIVE Final   Comment: (NOTE) SARS-CoV-2 target nucleic acids are NOT DETECTED.  The SARS-CoV-2 RNA is generally detectable in upper and lower respiratory specimens during the acute phase of infection. Negative results do not preclude SARS-CoV-2 infection, do not rule out co-infections with  other pathogens, and should not be used as the sole basis for treatment or other patient management decisions. Negative results must be combined with clinical observations, patient history, and epidemiological information. The expected result is Negative.  Fact Sheet for Patients: SugarRoll.be  Fact Sheet for Healthcare Providers: https://www.woods-mathews.com/  This test is not yet approved or cleared by the [place] FDA and  has been authorized for detection and/or diagnosis of SARS-CoV-2 by FDA under an Emergency Use Authorization (EUA). This EUA will remain  in effect (meaning this test can be used) for the duration of the COVID-19 declaration under Se                          ction 564(b)(1) of the Act, 21 U.S.C. section 360bbb-3(b)(1), unless the authorization is terminated or revoked sooner.  Performed at [facility], [place] [address], [place], [place] [address]   Clinical Support on 06/28/2020  Component Date Value Ref Range Status   Date Time Interrogation Session 06/28/2020 [id number]   Final   Pulse Generator Manufacturer 06/28/2020 MERM   Final   Pulse Gen Model 06/28/2020 W1DR01 Azure XT DR MRI   Final   Pulse Gen Serial Number 06/28/2020 [id number]   Final   Clinic Name 06/28/2020 [facility]   Final   Implantable Pulse Generator Type 06/28/2020 Implantable Pulse Generator   Final   Implantable Pulse Generator Implan* 06/28/2020 [id number]   Final   Implantable Lead Manufacturer 06/28/2020 [facility]   Final   Implantable Lead Model 06/28/2020 3830 SelectSecure   Final   Implantable Lead Serial Number 06/28/2020 [id number]   Final   Implantable Lead Implant Date 06/28/2020 [id number]   Final   Implantable Lead Location Detail 1 06/28/2020 UNKNOWN   Final   Implantable Lead Special Function 06/28/2020 BUNDLE OF HIS   Final   Implantable Lead Location 06/28/2020 [id number]   Final   Implantable Lead Manufacturer 06/28/2020  MERM   Final   Implantable Lead Model 06/28/2020 5076 CapSureFix Novus MRI SureScan   Final   Implantable Lead Serial Number 06/28/2020 [id number]   Final   Implantable Lead Implant Date 06/28/2020 [id number]   Final   Implantable Lead Location Detail 1 06/28/2020 APPENDAGE   Final   Implantable Lead Location 06/28/2020 [id number]   Final   Lead Channel Setting Sensing Sensi* 06/28/2020 1.2  mV Final   Lead Channel Setting Pacing Amplit* 06/28/2020 1.5  V Final   Lead Channel Setting Pacing Pulse * 06/28/2020 1  ms Final   Lead Channel Setting Pacing Amplit* 06/28/2020 3.5  V Final   Lead Channel Impedance Value 06/28/2020 456  ohm Final   Lead Channel Impedance Value 06/28/2020 342  ohm Final   Lead Channel Sensing Intrinsic Amp* 06/28/2020 3.25  mV Final   Lead Channel Sensing Intrinsic Amp* 06/28/2020 3.25  mV Final   Lead Channel Pacing Threshold Ampl* 06/28/2020 0.75  V Final   Lead Channel Pacing Threshold Puls* 06/28/2020 0.4  ms Final   Lead Channel Impedance Value 06/28/2020 475  ohm Final   Lead Channel Impedance Value 06/28/2020 323  ohm Final   Lead Channel Sensing Intrinsic Amp*  06/28/2020 9.875  mV Final   Lead Channel Sensing Intrinsic Amp* 06/28/2020 9.875  mV Final   Lead Channel Pacing Threshold Ampl* 06/28/2020 1.125  V Final   Lead Channel Pacing Threshold Puls* 06/28/2020 0.4  ms Final   Battery Status 06/28/2020 OK   Final   Battery Remaining Longevity 06/28/2020 93  mo Final   Battery Voltage 06/28/2020 3.00  V Final   Brady Statistic RA Percent Paced 06/28/2020 97.58  % Final   Brady Statistic RV Percent Paced 06/28/2020 0.21  % Final   Brady Statistic AP VP Percent 06/28/2020 0.21  % Final   Brady Statistic AS VP Percent 06/28/2020 0  % Final   Brady Statistic AP VS Percent 06/28/2020 97.37  % Final   Brady Statistic AS VS Percent 06/28/2020 2.43  % Final    Allergies: Methylpyrrolidone, Niacin, Norvasc [amlodipine besylate], Other, Oxybutynin chloride [oxybutynin  chloride er], Vesicare [solifenacin succinate], Ciprofloxacin, Oxybutynin, and Solifenacin  PTA Medications: (Not in a hospital admission)   Medical Decision Making  Patient with ongoing SI, HI, and hallucination. Patient unable to contract for safety. Patient is recommended for inpatient admission; patient's test positive for Covid, patient will be transferred to [facility] for continuous assessment and stabilization while awaiting inpatient psychiatric bed.     Recommendations  Based on my evaluation the patient appears to have an emergency medical condition for which I recommend the patient be transferred to the emergency department for further evaluation.  Ophelia Shoulder, NP 10/18/20  11:16 PM

## 2020-10-19 ENCOUNTER — Encounter (HOSPITAL_COMMUNITY): Payer: Self-pay

## 2020-10-19 ENCOUNTER — Emergency Department (HOSPITAL_COMMUNITY): Payer: PPO

## 2020-10-19 ENCOUNTER — Other Ambulatory Visit: Payer: Self-pay

## 2020-10-19 ENCOUNTER — Emergency Department (HOSPITAL_COMMUNITY)
Admission: EM | Admit: 2020-10-19 | Discharge: 2020-10-19 | Disposition: A | Discharge status: Home / Self Care | Payer: PPO | Source: Home / Self Care

## 2020-10-19 ENCOUNTER — Inpatient Hospital Stay (HOSPITAL_COMMUNITY)
Admission: EM | Admit: 2020-10-19 | Discharge: 2020-10-25 | DRG: 388 | Disposition: A | Discharge status: Home / Self Care | Payer: PPO | Attending: Internal Medicine | Admitting: Internal Medicine

## 2020-10-19 DIAGNOSIS — I7143 Infrarenal abdominal aortic aneurysm, without rupture: Secondary | ICD-10-CM | POA: Diagnosis present

## 2020-10-19 DIAGNOSIS — K529 Noninfective gastroenteritis and colitis, unspecified: Secondary | ICD-10-CM | POA: Diagnosis present

## 2020-10-19 DIAGNOSIS — E782 Mixed hyperlipidemia: Secondary | ICD-10-CM | POA: Diagnosis present

## 2020-10-19 DIAGNOSIS — N132 Hydronephrosis with renal and ureteral calculous obstruction: Secondary | ICD-10-CM | POA: Diagnosis present

## 2020-10-19 DIAGNOSIS — R059 Cough, unspecified: Secondary | ICD-10-CM | POA: Insufficient documentation

## 2020-10-19 DIAGNOSIS — R110 Nausea: Secondary | ICD-10-CM

## 2020-10-19 DIAGNOSIS — E669 Obesity, unspecified: Secondary | ICD-10-CM | POA: Diagnosis present

## 2020-10-19 DIAGNOSIS — U071 COVID-19: Secondary | ICD-10-CM

## 2020-10-19 DIAGNOSIS — I129 Hypertensive chronic kidney disease with stage 1 through stage 4 chronic kidney disease, or unspecified chronic kidney disease: Secondary | ICD-10-CM | POA: Diagnosis present

## 2020-10-19 DIAGNOSIS — I714 Abdominal aortic aneurysm, without rupture: Secondary | ICD-10-CM | POA: Diagnosis present

## 2020-10-19 DIAGNOSIS — Z79899 Other long term (current) drug therapy: Secondary | ICD-10-CM

## 2020-10-19 DIAGNOSIS — J449 Chronic obstructive pulmonary disease, unspecified: Secondary | ICD-10-CM | POA: Diagnosis present

## 2020-10-19 DIAGNOSIS — F250 Schizoaffective disorder, bipolar type: Secondary | ICD-10-CM | POA: Diagnosis present

## 2020-10-19 DIAGNOSIS — Z888 Allergy status to other drugs, medicaments and biological substances status: Secondary | ICD-10-CM

## 2020-10-19 DIAGNOSIS — Z87891 Personal history of nicotine dependence: Secondary | ICD-10-CM

## 2020-10-19 DIAGNOSIS — Z820 Family history of epilepsy and other diseases of the nervous system: Secondary | ICD-10-CM

## 2020-10-19 DIAGNOSIS — K219 Gastro-esophageal reflux disease without esophagitis: Secondary | ICD-10-CM | POA: Diagnosis present

## 2020-10-19 DIAGNOSIS — I10 Essential (primary) hypertension: Secondary | ICD-10-CM

## 2020-10-19 DIAGNOSIS — R1084 Generalized abdominal pain: Secondary | ICD-10-CM

## 2020-10-19 DIAGNOSIS — Z6828 Body mass index (BMI) 28.0-28.9, adult: Secondary | ICD-10-CM

## 2020-10-19 DIAGNOSIS — K920 Hematemesis: Secondary | ICD-10-CM | POA: Diagnosis present

## 2020-10-19 DIAGNOSIS — I480 Paroxysmal atrial fibrillation: Secondary | ICD-10-CM | POA: Diagnosis present

## 2020-10-19 DIAGNOSIS — Z0189 Encounter for other specified special examinations: Secondary | ICD-10-CM

## 2020-10-19 DIAGNOSIS — I4892 Unspecified atrial flutter: Secondary | ICD-10-CM | POA: Diagnosis present

## 2020-10-19 DIAGNOSIS — R0602 Shortness of breath: Secondary | ICD-10-CM | POA: Insufficient documentation

## 2020-10-19 DIAGNOSIS — K2270 Barrett's esophagus without dysplasia: Secondary | ICD-10-CM

## 2020-10-19 DIAGNOSIS — Z950 Presence of cardiac pacemaker: Secondary | ICD-10-CM

## 2020-10-19 DIAGNOSIS — R5383 Other fatigue: Secondary | ICD-10-CM | POA: Insufficient documentation

## 2020-10-19 DIAGNOSIS — E785 Hyperlipidemia, unspecified: Secondary | ICD-10-CM | POA: Diagnosis present

## 2020-10-19 DIAGNOSIS — I1A0 Resistant hypertension: Secondary | ICD-10-CM

## 2020-10-19 DIAGNOSIS — K567 Ileus, unspecified: Secondary | ICD-10-CM

## 2020-10-19 DIAGNOSIS — N179 Acute kidney failure, unspecified: Secondary | ICD-10-CM | POA: Diagnosis present

## 2020-10-19 DIAGNOSIS — Z5321 Procedure and treatment not carried out due to patient leaving prior to being seen by health care provider: Secondary | ICD-10-CM | POA: Insufficient documentation

## 2020-10-19 DIAGNOSIS — Z4659 Encounter for fitting and adjustment of other gastrointestinal appliance and device: Secondary | ICD-10-CM

## 2020-10-19 DIAGNOSIS — K56609 Unspecified intestinal obstruction, unspecified as to partial versus complete obstruction: Principal | ICD-10-CM

## 2020-10-19 DIAGNOSIS — N184 Chronic kidney disease, stage 4 (severe): Secondary | ICD-10-CM | POA: Diagnosis present

## 2020-10-19 DIAGNOSIS — R45851 Suicidal ideations: Secondary | ICD-10-CM

## 2020-10-19 LAB — COMPREHENSIVE METABOLIC PANEL
ALT: 16 U/L (ref 0–44)
AST: 21 U/L (ref 15–41)
Albumin: 3.6 g/dL (ref 3.5–5.0)
Alkaline Phosphatase: 59 U/L (ref 38–126)
Anion gap: 6 (ref 5–15)
BUN: 28 mg/dL — ABNORMAL HIGH (ref 8–23)
CO2: 23 mmol/L (ref 22–32)
Calcium: 8.8 mg/dL — ABNORMAL LOW (ref 8.9–10.3)
Chloride: 107 mmol/L (ref 98–111)
Creatinine, Ser: 2.5 mg/dL — ABNORMAL HIGH (ref 0.61–1.24)
GFR, Estimated: 29 mL/min — ABNORMAL LOW (ref >60)
Glucose, Bld: 95 mg/dL (ref 70–99)
Potassium: 4.4 mmol/L (ref 3.5–5.1)
Sodium: 136 mmol/L (ref 135–145)
Total Bilirubin: 0.5 mg/dL (ref 0.3–1.2)
Total Protein: 6.2 g/dL — ABNORMAL LOW (ref 6.5–8.1)

## 2020-10-19 LAB — CBC WITH DIFFERENTIAL/PLATELET
Abs Immature Granulocytes: 0.03 10*3/uL (ref 0.00–0.07)
Basophils Absolute: 0 10*3/uL (ref 0.0–0.1)
Basophils Relative: 0 %
Eosinophils Absolute: 0.2 10*3/uL (ref 0.0–0.5)
Eosinophils Relative: 4 %
HCT: 42.5 % (ref 39.0–52.0)
Hemoglobin: 13.6 g/dL (ref 13.0–17.0)
Immature Granulocytes: 1 %
Lymphocytes Relative: 33 %
Lymphs Abs: 1.6 10*3/uL (ref 0.7–4.0)
MCH: 28.3 pg (ref 26.0–34.0)
MCHC: 32 g/dL (ref 30.0–36.0)
MCV: 88.4 fL (ref 80.0–100.0)
Monocytes Absolute: 0.6 10*3/uL (ref 0.1–1.0)
Monocytes Relative: 13 %
Neutro Abs: 2.3 10*3/uL (ref 1.7–7.7)
Neutrophils Relative %: 49 %
Platelets: 165 10*3/uL (ref 150–400)
RBC: 4.81 MIL/uL (ref 4.22–5.81)
RDW: 12.7 % (ref 11.5–15.5)
WBC: 4.7 10*3/uL (ref 4.0–10.5)
nRBC: 0 % (ref 0.0–0.2)

## 2020-10-19 LAB — RESP PANEL BY RT-PCR (FLU A&B, COVID) ARPGX2
Influenza A by PCR: NEGATIVE
Influenza B by PCR: NEGATIVE
SARS Coronavirus 2 by RT PCR: POSITIVE — AB

## 2020-10-19 LAB — RAPID URINE DRUG SCREEN, HOSP PERFORMED
Amphetamines: NOT DETECTED
Barbiturates: NOT DETECTED
Benzodiazepines: NOT DETECTED
Cocaine: NOT DETECTED
Opiates: NOT DETECTED
Tetrahydrocannabinol: POSITIVE — AB

## 2020-10-19 LAB — ETHANOL: Alcohol, Ethyl (B): <10 mg/dL (ref <10)

## 2020-10-19 MED ORDER — PANCRELIPASE (LIP-PROT-AMYL) 40000-126000 UNITS PO CPEP: 1.0000 | ORAL_CAPSULE | Freq: Three times a day (TID) | ORAL | Status: DC

## 2020-10-19 MED ORDER — PANCRELIPASE (LIP-PROT-AMYL) 36000-114000 UNITS PO CPEP
36000.0000 [IU] | ORAL_CAPSULE | Freq: Three times a day (TID) | ORAL | Status: DC
  Administered 2020-10-19 – 2020-10-21 (×6): 36000 [IU] via ORAL
  Filled 2020-10-19 (×10): qty 1

## 2020-10-19 MED ORDER — PANTOPRAZOLE SODIUM 40 MG PO TBEC
40.0000 mg | DELAYED_RELEASE_TABLET | Freq: Two times a day (BID) | ORAL | Status: DC
  Administered 2020-10-19 – 2020-10-21 (×5): 40 mg via ORAL
  Filled 2020-10-19 (×5): qty 1

## 2020-10-19 MED ORDER — BUDESON-GLYCOPYRROL-FORMOTEROL 160-9-4.8 MCG/ACT IN AERO: 2.0000 | INHALATION_SPRAY | Freq: Two times a day (BID) | RESPIRATORY_TRACT | Status: DC

## 2020-10-19 MED ORDER — BUPROPION HCL ER (SR) 150 MG PO TB12
150.0000 mg | ORAL_TABLET | Freq: Two times a day (BID) | ORAL | Status: DC
  Administered 2020-10-19 – 2020-10-21 (×4): 150 mg via ORAL
  Filled 2020-10-19 (×5): qty 1

## 2020-10-19 MED ORDER — GABAPENTIN 100 MG PO CAPS
100.0000 mg | ORAL_CAPSULE | Freq: Three times a day (TID) | ORAL | Status: DC
  Administered 2020-10-19 – 2020-10-21 (×7): 100 mg via ORAL
  Filled 2020-10-19 (×8): qty 1

## 2020-10-19 MED ORDER — METOPROLOL TARTRATE 25 MG PO TABS
100.0000 mg | ORAL_TABLET | Freq: Two times a day (BID) | ORAL | Status: DC
  Administered 2020-10-19 – 2020-10-21 (×5): 100 mg via ORAL
  Filled 2020-10-19 (×5): qty 4

## 2020-10-19 MED ORDER — VITAMIN B-12 100 MCG PO TABS
100.0000 ug | ORAL_TABLET | Freq: Every day | ORAL | Status: DC
  Administered 2020-10-19 – 2020-10-21 (×3): 100 ug via ORAL
  Filled 2020-10-19 (×3): qty 1

## 2020-10-19 MED ORDER — OLANZAPINE 10 MG PO TBDP
10.0000 mg | ORAL_TABLET | Freq: Three times a day (TID) | ORAL | Status: DC | PRN
  Administered 2020-10-19: 10 mg via ORAL
  Filled 2020-10-19: qty 1

## 2020-10-19 MED ORDER — FENOFIBRATE 54 MG PO TABS
54.0000 mg | ORAL_TABLET | Freq: Every day | ORAL | Status: DC
  Administered 2020-10-19 – 2020-10-21 (×3): 54 mg via ORAL
  Filled 2020-10-19 (×3): qty 1

## 2020-10-19 MED ORDER — TAMSULOSIN HCL 0.4 MG PO CAPS
0.4000 mg | ORAL_CAPSULE | Freq: Every day | ORAL | Status: DC
  Administered 2020-10-19 – 2020-10-20 (×2): 0.4 mg via ORAL
  Filled 2020-10-19 (×2): qty 1

## 2020-10-19 MED ORDER — FLUTICASONE PROPIONATE 50 MCG/ACT NA SUSP
1.0000 | Freq: Every day | NASAL | Status: DC | PRN
  Filled 2020-10-19 (×3): qty 16

## 2020-10-19 MED ORDER — PALIPERIDONE ER 6 MG PO TB24
6.0000 mg | ORAL_TABLET | Freq: Every day | ORAL | Status: DC
  Administered 2020-10-19 – 2020-10-20 (×2): 6 mg via ORAL
  Filled 2020-10-19 (×4): qty 1

## 2020-10-19 MED ORDER — UMECLIDINIUM BROMIDE 62.5 MCG/INH IN AEPB
1.0000 | INHALATION_SPRAY | Freq: Every day | RESPIRATORY_TRACT | Status: DC
  Administered 2020-10-19 – 2020-10-21 (×3): 1 via RESPIRATORY_TRACT
  Filled 2020-10-19: qty 7

## 2020-10-19 MED ORDER — ACETAMINOPHEN 325 MG PO TABS
650.0000 mg | ORAL_TABLET | ORAL | Status: DC | PRN
  Administered 2020-10-20: 650 mg via ORAL
  Filled 2020-10-19: qty 2

## 2020-10-19 MED ORDER — ALBUTEROL SULFATE HFA 108 (90 BASE) MCG/ACT IN AERS
2.0000 | INHALATION_SPRAY | Freq: Four times a day (QID) | RESPIRATORY_TRACT | Status: DC | PRN
  Administered 2020-10-19 – 2020-10-21 (×2): 2 via RESPIRATORY_TRACT
  Filled 2020-10-19 (×2): qty 6.7

## 2020-10-19 MED ORDER — CLONIDINE HCL 0.2 MG PO TABS
0.2000 mg | ORAL_TABLET | Freq: Two times a day (BID) | ORAL | Status: DC
  Administered 2020-10-19 – 2020-10-21 (×5): 0.2 mg via ORAL
  Filled 2020-10-19 (×5): qty 1

## 2020-10-19 MED ORDER — ISOSORBIDE DINITRATE 30 MG PO TABS
30.0000 mg | ORAL_TABLET | Freq: Four times a day (QID) | ORAL | Status: DC
  Administered 2020-10-19 – 2020-10-22 (×10): 30 mg via ORAL
  Filled 2020-10-19 (×16): qty 1

## 2020-10-19 MED ORDER — SUCRALFATE 1 GM/10ML PO SUSP
1.0000 g | Freq: Three times a day (TID) | ORAL | Status: DC
  Administered 2020-10-19 – 2020-10-21 (×9): 1 g via ORAL
  Filled 2020-10-19 (×14): qty 10

## 2020-10-19 MED ORDER — HYDRALAZINE HCL 25 MG PO TABS
50.0000 mg | ORAL_TABLET | Freq: Three times a day (TID) | ORAL | Status: DC
  Administered 2020-10-19 – 2020-10-21 (×7): 50 mg via ORAL
  Filled 2020-10-19 (×7): qty 2

## 2020-10-19 MED ORDER — LORAZEPAM 1 MG PO TABS
1.0000 mg | ORAL_TABLET | ORAL | Status: AC | PRN
  Administered 2020-10-19: 1 mg via ORAL
  Filled 2020-10-19: qty 1

## 2020-10-19 MED ORDER — FUROSEMIDE 20 MG PO TABS
20.0000 mg | ORAL_TABLET | Freq: Every day | ORAL | Status: DC
  Administered 2020-10-19 – 2020-10-21 (×3): 20 mg via ORAL
  Filled 2020-10-19 (×3): qty 1

## 2020-10-19 MED ORDER — BENAZEPRIL HCL 5 MG PO TABS
10.0000 mg | ORAL_TABLET | Freq: Every day | ORAL | Status: DC
  Administered 2020-10-19 – 2020-10-21 (×3): 10 mg via ORAL
  Filled 2020-10-19 (×3): qty 2

## 2020-10-19 MED ORDER — ONDANSETRON 4 MG PO TBDP: 4.0000 mg | ORAL_TABLET | Freq: Three times a day (TID) | ORAL | Status: DC | PRN

## 2020-10-19 MED ORDER — MOMETASONE FURO-FORMOTEROL FUM 100-5 MCG/ACT IN AERO
2.0000 | INHALATION_SPRAY | Freq: Two times a day (BID) | RESPIRATORY_TRACT | Status: DC
  Administered 2020-10-19 – 2020-10-25 (×11): 2 via RESPIRATORY_TRACT
  Filled 2020-10-19 (×2): qty 8.8

## 2020-10-19 MED ORDER — MONTELUKAST SODIUM 10 MG PO TABS
10.0000 mg | ORAL_TABLET | Freq: Every day | ORAL | Status: DC
  Administered 2020-10-19 – 2020-10-20 (×2): 10 mg via ORAL
  Filled 2020-10-19 (×4): qty 1

## 2020-10-19 NOTE — ED Notes (Signed)
Pt speaking with wife by phone, wife updated.

## 2020-10-19 NOTE — ED Notes (Signed)
Pt and spouse [name] updated. Both agreeable.

## 2020-10-19 NOTE — BH Assessment (Signed)
On 6/13, per Jerral Bonito, NP, Patient is recommended for inpatient admission; patient's test positive for Covid, patient will be transferred to [facility] for continuous assessment and stabilization while awaiting inpatient psychiatric bed.   Due to COVID + results patient unable to be referred to outside facilities. Disposition pending continued daily assessments by [facility] providers and TTS staff.

## 2020-10-19 NOTE — ED Notes (Signed)
[name], wife, [phone] would like an update when available

## 2020-10-19 NOTE — BH Assessment (Signed)
Clinician had been informed by charge nurse at [facility] that the patient had eloped.  Clinician informed RN Deneise Lever that he was going to leave [place] to go to the magistrate's office to file IVC papers.  Clinician let her know that patient would not be able to be returned to the [facility] when IVC'ed due to his being COVID positive.  Pt will need to go to one of the EDs.

## 2020-10-19 NOTE — ED Triage Notes (Signed)
Sent by [facility] due to going to for SI and covid test came back positive.  Patient reports not feeling well x 4 days.  Fatigue, mild cough mild sob.  Patient denies SI here just reports he is depressed because of all his sicknesses and goes off on tangents when speaking.

## 2020-10-19 NOTE — ED Triage Notes (Signed)
Seen here last night and left AMA. Here by [facility] from [facility]. Was at [facility] for [place]. Sent here d/t being covid positive. Reports has had covid sx for 6 weeks, but tested positive yesterday. Mentions some: "not feeling well x 4 days, fatigue, mild cough mild sob.  Patient denies SI at this time. Admits depressed because of all his sicknesses and goes off on tangents when speaking.

## 2020-10-19 NOTE — ED Notes (Signed)
Patient immediately after triage came out and said he was leaving and that he takes care of himself.  Attempted to stop patient, patient would not stop.  Did not have chance to have MSE waiver signed as patient was agitated in triage and rambling about how every time he comes here he is treated bad.  Explained I have never taken care of him.  Asked security to stop him and security stated "if he isnt IVC'd I can't stop him".  Patient continued to state "I would never hurt myself or anyone else ma'am"  Patient alert and oriented and ambulatory with steady gait out of ER. Charge RN aware.

## 2020-10-19 NOTE — ED Notes (Signed)
Charge is calling [facility] since patient was not seen by MD here and having them IVC him if need be

## 2020-10-19 NOTE — ED Notes (Signed)
Continuing to wait on meds from pharmacy. Pharmacy notified.

## 2020-10-19 NOTE — ED Triage Notes (Signed)
Dr. Regenia Skeeter into room, at [facility]

## 2020-10-19 NOTE — ED Notes (Signed)
Pt was given 1mg  of Lorazepam/ 10 mg of Olanzapine by mouth due to agitation prior to being transferred to [place] ed.

## 2020-10-19 NOTE — ED Provider Notes (Signed)
[facility] EMERGENCY DEPARTMENT Provider Note   CSN: [id number] Arrival date & time: 10/19/20  0740     History Chief Complaint  Patient presents with   Medical Clearance    Peter Marsh is a 60 y.o. male.  HPI 59 year old male sent from [facility] for positive COVID test.  He originally went there because he is "stressed out".  He cites his neighbors.  While there he also was endorsing visual hallucinations and suicidal thoughts with worsening depression.  To me he denies being currently suicidal.  When asked about COVID symptoms, he states that since Mother's Day he has been having cough and shortness of breath and seems to be improving.  He denies a fever.  Past Medical History:  Diagnosis Date   Allergy    Anxiety    Asthma    uses inhalers    Bilateral carotid bruits    Cardiac conduction disorder 2018   s/p MDT PPM   Cataract    Chronic kidney disease    bladder interstial cystitis    Chronic kidney disease (CKD), stage IV (severe) ([place])    followed by Dr. Joelyn Oms at [facility]   COPD (chronic obstructive pulmonary disease) ([place])    Depression    Encounter for care of pacemaker 02/13/2019   GERD (gastroesophageal reflux disease)    History of stomach ulcers 2001   Hypertension    LBBB (left bundle branch block)    Lower extremity edema    Mild intermittent asthma without complication    Mixed hyperlipidemia    Mobitz type 2 second degree AV block 04/06/2019   Neuropathy    Pacemaker: Medtronic Azure XT DR MRI [id number] PPM -  BUNDLE OF HIS pacing  02/26/2017   Scheduled Remote pacemaker check  11/12/2018:  There were 24 Fast AV episodes:  EGMs show SVTs. Episodes lasted < 2 minutes. Health trends do not demonstrate significant abnormality. Battery longevity is 9.4 - 10.3 years. RA pacing is 47.1 %, RV pacing is 40.4 %.  Clinic check 11/07/17.    Paroxysmal atrial flutter ([name])    PONV (postoperative nausea and vomiting)    Prostatitis     Recurrent upper respiratory infection (URI)    Schizophrenia ([name])    Sinus node dysfunction ([place]) 02/13/2019   Urticaria     Patient Active Problem List   Diagnosis Date Noted   Allergic rhinitis due to pollen 09/22/2020   Chronic obstructive pulmonary disease, unspecified ([place]) 09/22/2020   Chronic pancreatitis ([place]) 09/22/2020   Gastric ulcer, unspecified as acute or chronic, without hemorrhage or perforation 09/22/2020   History of adenomatous polyp of colon 09/22/2020   Hyperglycemia 09/22/2020   Left bundle branch block 09/22/2020   Schizoaffective disorder, bipolar type ([place]) 09/22/2020   Testicular hypofunction 09/22/2020   Chronic kidney disease due to hypertension 09/22/2020   Chronic renal failure syndrome 09/22/2020   Barrett's esophagus 09/22/2020   Chronic GERD 09/22/2020   Psychotic disorder ([place]) 09/22/2020   Atrial flutter ([place]) 09/22/2020   Hepatitis C antibody positive in blood 09/22/2020   Hypertensive heart and chronic kidney disease with heart failure and stage 1 through stage 4 chronic kidney disease, or chronic kidney disease ([place]) 09/20/2020   Gait abnormality 10/22/2019   Vitamin B12 deficiency 10/22/2019   Idiopathic peripheral neuropathy 10/22/2019   Paresthesia 09/08/2019   Mobitz type 2 second degree AV block 04/06/2019   Encounter for care of pacemaker 02/13/2019   Sinus node dysfunction ([place]) 02/13/2019  Encephalopathy 11/19/2018   GERD (gastroesophageal reflux disease) 11/19/2018   Other forms of angina pectoris ([place]) 06/24/2018   Essential hypertension 06/24/2018   Wide QRS ventricular tachycardia ([place]) 02/26/2017   Pacemaker: Medtronic Azure XT DR MRI [id number] PPM -  BUNDLE OF HIS pacing  02/26/2017   CKD (chronic kidney disease), stage III ([place]) 09/16/2016   Schizophrenia ([place]) 09/16/2016   Upper GI bleed 09/16/2016   Asthma 02/09/2016   Herniated lumbar intervertebral disc 04/20/2011   Hyperlipidemia with target LDL less than 130  04/20/2009   Gastroparesis 11/24/2008   History of tobacco abuse 08/14/2008   Low back pain 11/15/2007    Past Surgical History:  Procedure Laterality Date   ADENOIDECTOMY     APPENDECTOMY     COLONOSCOPY     ELECTROPHYSIOLOGY STUDY N/A 02/26/2017   Procedure: ELECTROPHYSIOLOGY STUDY;  Surgeon: Evans Lance, MD;  Location: [place] [facility];  Service: Cardiovascular;  Laterality: N/A;   ESOPHAGOGASTRODUODENOSCOPY (EGD) WITH PROPOFOL N/A 09/18/2016   Procedure: ESOPHAGOGASTRODUODENOSCOPY (EGD) WITH PROPOFOL;  Surgeon: Ronnette Juniper, MD;  Location: [place];  Service: Gastroenterology;  Laterality: N/A;   LUMBAR LAMINECTOMY/DECOMPRESSION MICRODISCECTOMY  04/19/2011   Procedure: LUMBAR LAMINECTOMY/DECOMPRESSION MICRODISCECTOMY;  Surgeon: Tobi Bastos;  Location: [facility];  Service: Orthopedics;  Laterality: Left;  Hemi LAminectomy/Microdiscectomy Lumbar four  - Lumbar five  on the Left (X-Ray)   PACEMAKER IMPLANT N/A 02/26/2017   Procedure: PACEMAKER IMPLANT;  Surgeon: Evans Lance, MD;  Location: [place] [facility];  Service: Cardiovascular;  Laterality: N/A;   TONSILLECTOMY     UPPER GASTROINTESTINAL ENDOSCOPY         Family History  Problem Relation Age of Onset   High blood pressure Mother    Alzheimer's disease Father    Colon cancer Neg Hx    Esophageal cancer Neg Hx    Rectal cancer Neg Hx    Stomach cancer Neg Hx     Social History   Tobacco Use   Smoking status: Former    Packs/day: 1.00    Years: 30.00    Pack years: 30.00    Types: Cigarettes    Quit date: 05/08/2008    Years since quitting: 12.4   Smokeless tobacco: Never  Vaping Use   Vaping Use: Never used  Substance Use Topics   Alcohol use: No   Drug use: No    Home Medications Prior to Admission medications   Medication Sig Start Date End Date Taking? Authorizing Provider  acetaminophen (TYLENOL) 325 MG tablet Take 650 mg by mouth every 6 (six) hours as needed for mild pain or headache.     [provider]  albuterol (VENTOLIN HFA) 108 (90 Base) MCG/ACT inhaler Inhale 2 puffs into the lungs every 6 (six) hours as needed for wheezing or shortness of breath. 09/08/20   Dara Hoyer, FNP  benazepril (LOTENSIN) 10 MG tablet Take 1 tablet (10 mg total) by mouth daily. 04/15/19   Adrian Prows, MD  Budeson-Glycopyrrol-Formoterol (BREZTRI AEROSPHERE) 160-9-4.8 MCG/ACT AERO 2 puffs twice a day with a spacer 09/08/20   Ambs, Kathrine Cords, FNP  buPROPion [facility] SR) 150 MG 12 hr tablet Take 150 mg by mouth 2 (two) times daily.    [provider]  cloNIDine (CATAPRES) 0.2 MG tablet Take 1 tablet (0.2 mg total) by mouth 2 (two) times daily. Please keep upcoming appt with Dr. Lovena Le in July 2022 before anymore refills. Thank you 10/18/20   Evans Lance, MD  Cyanocobalamin (VITAMIN  B-12 PO) Take 1 tablet by mouth daily.    [provider]  ezetimibe (ZETIA) 10 MG tablet Take 1 tablet (10 mg total) by mouth daily. 05/21/20 08/19/20  O'NealCassie Freer, MD  Fenofibrate 50 MG CAPS Take 1 capsule (50 mg total) by mouth daily. 02/17/20 02/16/21  Vevelyn Francois, NP  fluticasone (FLONASE) 50 MCG/ACT nasal spray Place 1 spray into both nostrils daily as needed for allergies. 09/08/20   Dara Hoyer, FNP  furosemide (LASIX) 20 MG tablet Take 1 tablet (20 mg total) by mouth daily. 07/15/20   O'NealCassie Freer, MD  gabapentin (NEURONTIN) 100 MG capsule Take 1 capsule (100 mg total) by mouth 3 (three) times daily. 09/08/19   Marcial Pacas, MD  hydrALAZINE (APRESOLINE) 50 MG tablet Take 1 tablet (50 mg total) by mouth 3 (three) times daily. 04/15/20   O'NealCassie Freer, MD  isosorbide dinitrate (ISORDIL) 30 MG tablet TAKE 1 TABLET(30 MG) BY MOUTH FOUR TIMES DAILY 04/15/20   O'Neal, Cassie Freer, MD  metoprolol tartrate (LOPRESSOR) 100 MG tablet Take 1 tablet (100 mg total) by mouth 2 (two) times daily. 04/15/20   Geralynn Rile, MD  montelukast (SINGULAIR) 10 MG tablet Take 1 tablet (10  mg total) by mouth at bedtime. 09/08/20 12/07/20  Dara Hoyer, FNP  ondansetron (ZOFRAN ODT) 4 MG disintegrating tablet Take 1 tablet (4 mg total) by mouth every 8 (eight) hours as needed for nausea or vomiting. 07/11/20   Kinnie Feil, PA-C  paliperidone (INVEGA) 6 MG 24 hr tablet Take 6 mg by mouth at bedtime.    [provider]  Pancrelipase, Lip-Prot-Amyl, (ZENPEP) 40000-126000 units CPEP Take 1 tablet by mouth with breakfast, with lunch, and with evening meal. 04/12/20   Thornton Park, MD  pantoprazole (PROTONIX) 40 MG tablet Take 1 tablet (40 mg total) by mouth 2 (two) times daily. 04/12/20   Thornton Park, MD  sucralfate (CARAFATE) 1 GM/10ML suspension Take 10 mLs (1 g total) by mouth 4 (four) times daily -  with meals and at bedtime. Patient taking differently: Take 1 g by mouth 3 (three) times daily as needed. 04/12/20   Thornton Park, MD  tamsulosin (FLOMAX) 0.4 MG CAPS capsule Take 1 capsule (0.4 mg total) by mouth daily. 02/12/20   O'NealCassie Freer, MD    Allergies    Methylpyrrolidone, Niacin, Norvasc [amlodipine besylate], Other, Oxybutynin chloride [oxybutynin chloride er], Vesicare [solifenacin succinate], Ciprofloxacin, Oxybutynin, and Solifenacin  Review of Systems   Review of Systems  Constitutional:  Negative for fever.  Respiratory:  Positive for cough and shortness of breath.   Cardiovascular:  Negative for chest pain.  Psychiatric/Behavioral:  Positive for dysphoric mood and suicidal ideas.   All other systems reviewed and are negative.  Physical Exam Updated Vital Signs BP (!) 165/94   Pulse 61   Temp (!) 97.5 F (36.4 C) (Oral)   Resp 15   Ht 6\' 1"  (1.854 m)   Wt 99.8 kg   SpO2 97%   BMI 29.03 kg/m   Physical Exam Vitals and nursing note reviewed.  Constitutional:      Appearance: He is well-developed. He is obese.  HENT:     Head: Normocephalic and atraumatic.     Right Ear: External ear normal.     Left Ear: External ear  normal.     Nose: Nose normal.  Eyes:     General:        Right eye: No discharge.  Left eye: No discharge.  Cardiovascular:     Rate and Rhythm: Normal rate and regular rhythm.     Heart sounds: Normal heart sounds.  Pulmonary:     Effort: Pulmonary effort is normal.     Breath sounds: Normal breath sounds. No wheezing or rales.  Abdominal:     Palpations: Abdomen is soft.     Tenderness: There is no abdominal tenderness.  Musculoskeletal:     Cervical back: Neck supple.  Skin:    General: Skin is warm and dry.  Neurological:     Mental Status: He is alert.  Psychiatric:        Attention and Perception: He does not perceive auditory or visual hallucinations.        Mood and Affect: Mood is not anxious.        Thought Content: Thought content does not include suicidal ideation.    ED Results / Procedures / Treatments   Labs (all labs ordered are listed, but only abnormal results are displayed) Labs Reviewed  COMPREHENSIVE METABOLIC PANEL - Abnormal; Notable for the following components:      Result Value   BUN 28 (*)    Creatinine, Ser 2.50 (*)    Calcium 8.8 (*)    Total Protein 6.2 (*)    GFR, Estimated 29 (*)    All other components within normal limits  ETHANOL  CBC WITH DIFFERENTIAL/PLATELET  RAPID URINE DRUG SCREEN, HOSP PERFORMED    EKG EKG Interpretation  Date/Time:  Tuesday October 19 2020 08:18:30 EDT Ventricular Rate:  69 PR Interval:  213 QRS Duration: 170 QT Interval:  460 QTC Calculation: 493 R Axis:   30 Text Interpretation: Sinus rhythm Borderline prolonged PR interval Left bundle branch block Confirmed by Sherwood Gambler [phone]) on 10/19/2020 9:06:13 AM  Radiology DG Chest Portable 1 View  Result Date: 10/19/2020 CLINICAL DATA:  COVID. Additional history provided: COVID positive 6/13. Patient reports chest pain, shortness of breath, cough. EXAM: PORTABLE CHEST 1 VIEW COMPARISON:  Prior chest radiographs 07/11/2020 and earlier. FINDINGS:  Redemonstrated left chest dual lead implantable cardiac device. Mild cardiomegaly, unchanged. Aortic atherosclerosis. No appreciable airspace consolidation or pulmonary edema. No evidence of pleural effusion or pneumothorax. No acute bony abnormality identified. IMPRESSION: No evidence of acute cardiopulmonary abnormality. Mild cardiomegaly, unchanged. Aortic Atherosclerosis (ICD10-I70.0). Electronically Signed   By: Kellie Simmering DO   On: 10/19/2020 08:46    Procedures Procedures   Medications Ordered in ED Medications  acetaminophen (TYLENOL) tablet 650 mg (has no administration in time range)  albuterol (VENTOLIN HFA) 108 (90 Base) MCG/ACT inhaler 2 puff (has no administration in time range)  benazepril (LOTENSIN) tablet 10 mg (has no administration in time range)  Budeson-Glycopyrrol-Formoterol 160-9-4.8 MCG/ACT AERO 2 puff (has no administration in time range)  buPROPion (WELLBUTRIN SR) 12 hr tablet 150 mg (has no administration in time range)  cloNIDine (CATAPRES) tablet 0.2 mg (has no administration in time range)  vitamin B-12 (CYANOCOBALAMIN) tablet 100 mcg (has no administration in time range)  fenofibrate tablet 54 mg (has no administration in time range)  fluticasone (FLONASE) 50 MCG/ACT nasal spray 1 spray (has no administration in time range)  furosemide (LASIX) tablet 20 mg (has no administration in time range)  gabapentin (NEURONTIN) capsule 100 mg (has no administration in time range)  hydrALAZINE (APRESOLINE) tablet 50 mg (has no administration in time range)  isosorbide dinitrate (ISORDIL) tablet 30 mg (has no administration in time range)  metoprolol tartrate (LOPRESSOR) tablet 100 mg (  has no administration in time range)  montelukast (SINGULAIR) tablet 10 mg (has no administration in time range)  ondansetron (ZOFRAN-ODT) disintegrating tablet 4 mg (has no administration in time range)  paliperidone (INVEGA) 24 hr tablet 6 mg (has no administration in time range)   Pancrelipase (Lip-Prot-Amyl) 40000-126000 units CPEP 1 tablet (has no administration in time range)  pantoprazole (PROTONIX) EC tablet 40 mg (has no administration in time range)  sucralfate (CARAFATE) 1 GM/10ML suspension 1 g (has no administration in time range)  tamsulosin (FLOMAX) capsule 0.4 mg (has no administration in time range)    ED Course  I have reviewed the triage vital signs and the nursing notes.  Pertinent labs & imaging results that were available during my care of the patient were reviewed by me and considered in my medical decision making (see chart for details).    MDM Rules/Calculators/A&P                          From a COVID perspective, the patient appears well and is not hypoxic and in no respiratory distress.  It is unclear when exactly his symptoms started but he is probably not in the window to get any type of treatment given it sounds like it was many weeks ago.  From a psychiatric perspective, he was recommended to be inpatient and will need placement.  He otherwise has a mild bump in his creatinine so we will hold his Lasix for today and restart tomorrow while ordering his home meds. He is medically stable for psychiatric admission Final Clinical Impression(s) / ED Diagnoses Final diagnoses:  COVID-19 virus infection  Suicidal thoughts    Rx / DC Orders ED Discharge Orders     None        Sherwood Gambler, MD 10/19/20 1359

## 2020-10-20 ENCOUNTER — Telehealth (HOSPITAL_COMMUNITY): Payer: Self-pay

## 2020-10-20 NOTE — ED Notes (Signed)
[name], wife, [phone] would like an update when available

## 2020-10-20 NOTE — ED Notes (Signed)
Breakfast Ordered 

## 2020-10-20 NOTE — ED Notes (Signed)
Pt eating dinner at this time

## 2020-10-20 NOTE — ED Notes (Signed)
ED Provider at bedside. 

## 2020-10-20 NOTE — ED Notes (Signed)
[name], wife, [phone] would like an update when available

## 2020-10-20 NOTE — ED Notes (Signed)
Pt resting at this time. VS WNL. Visible chest rise and fall noted. Pt appears in NAD.  

## 2020-10-20 NOTE — Telephone Encounter (Signed)
Patient spouse came in office looking for patient. Was told by [facility] long staff that he was here. Directed patients spouse to [facility]

## 2020-10-20 NOTE — ED Provider Notes (Signed)
Emergency Medicine Observation Re-evaluation Note  Peter Marsh is a 60 y.o. male, seen on rounds today.  Pt initially presented to the ED for complaints of Medical Clearance Currently, the patient is resting, in no distress.  Physical Exam  BP 133/79 (BP Location: Left Arm)   Pulse 66   Temp 97.7 F (36.5 C) (Oral)   Resp 18   Ht 6\' 1"  (1.854 m)   Wt 99.8 kg   SpO2 98%   BMI 29.03 kg/m  Physical Exam General: No distress, resting Cardiac: Regular rate and rhythm Lungs: No increased work of breathing  ED Course / MDM  EKG:EKG Interpretation  Date/Time:  Tuesday October 19 2020 08:18:30 EDT Ventricular Rate:  69 PR Interval:  213 QRS Duration: 170 QT Interval:  460 QTC Calculation: 493 R Axis:   30 Text Interpretation: Sinus rhythm Borderline prolonged PR interval Left bundle branch block Confirmed by Sherwood Gambler [phone]) on 10/19/2020 9:06:13 AM  I have reviewed the labs performed to date as well as medications administered while in observation.  Recent changes in the last 24 hours include none.  Plan  Current plan is for behavioral health placement.  He was transferred to this hospital due to positive COVID status, and is being considered for placement at multiple facilities.   Carmin Muskrat, MD 10/20/20 1539

## 2020-10-21 ENCOUNTER — Encounter (HOSPITAL_COMMUNITY): Payer: Self-pay | Admitting: Internal Medicine

## 2020-10-21 ENCOUNTER — Observation Stay (HOSPITAL_COMMUNITY): Payer: PPO

## 2020-10-21 ENCOUNTER — Emergency Department (HOSPITAL_COMMUNITY): Payer: PPO

## 2020-10-21 DIAGNOSIS — K219 Gastro-esophageal reflux disease without esophagitis: Secondary | ICD-10-CM

## 2020-10-21 DIAGNOSIS — I7143 Infrarenal abdominal aortic aneurysm, without rupture: Secondary | ICD-10-CM | POA: Diagnosis present

## 2020-10-21 DIAGNOSIS — N184 Chronic kidney disease, stage 4 (severe): Secondary | ICD-10-CM

## 2020-10-21 DIAGNOSIS — N179 Acute kidney failure, unspecified: Secondary | ICD-10-CM | POA: Diagnosis not present

## 2020-10-21 DIAGNOSIS — K567 Ileus, unspecified: Secondary | ICD-10-CM | POA: Diagnosis not present

## 2020-10-21 DIAGNOSIS — I714 Abdominal aortic aneurysm, without rupture: Secondary | ICD-10-CM | POA: Diagnosis not present

## 2020-10-21 DIAGNOSIS — F250 Schizoaffective disorder, bipolar type: Secondary | ICD-10-CM

## 2020-10-21 DIAGNOSIS — U071 COVID-19: Secondary | ICD-10-CM

## 2020-10-21 DIAGNOSIS — I10 Essential (primary) hypertension: Secondary | ICD-10-CM

## 2020-10-21 DIAGNOSIS — Z4682 Encounter for fitting and adjustment of non-vascular catheter: Secondary | ICD-10-CM | POA: Diagnosis not present

## 2020-10-21 DIAGNOSIS — K920 Hematemesis: Secondary | ICD-10-CM

## 2020-10-21 DIAGNOSIS — K6389 Other specified diseases of intestine: Secondary | ICD-10-CM | POA: Diagnosis not present

## 2020-10-21 HISTORY — DX: COVID-19: U07.1

## 2020-10-21 LAB — CBC WITH DIFFERENTIAL/PLATELET
Abs Immature Granulocytes: 0.05 10*3/uL (ref 0.00–0.07)
Basophils Absolute: 0 10*3/uL (ref 0.0–0.1)
Basophils Relative: 0 %
Eosinophils Absolute: 0.1 10*3/uL (ref 0.0–0.5)
Eosinophils Relative: 1 %
HCT: 48.6 % (ref 39.0–52.0)
Hemoglobin: 15.6 g/dL (ref 13.0–17.0)
Immature Granulocytes: 1 %
Lymphocytes Relative: 12 %
Lymphs Abs: 1.1 10*3/uL (ref 0.7–4.0)
MCH: 28.4 pg (ref 26.0–34.0)
MCHC: 32.1 g/dL (ref 30.0–36.0)
MCV: 88.4 fL (ref 80.0–100.0)
Monocytes Absolute: 0.7 10*3/uL (ref 0.1–1.0)
Monocytes Relative: 7 %
Neutro Abs: 7.6 10*3/uL (ref 1.7–7.7)
Neutrophils Relative %: 79 %
Platelets: 187 10*3/uL (ref 150–400)
RBC: 5.5 MIL/uL (ref 4.22–5.81)
RDW: 12.8 % (ref 11.5–15.5)
WBC: 9.5 10*3/uL (ref 4.0–10.5)
nRBC: 0 % (ref 0.0–0.2)

## 2020-10-21 LAB — COMPREHENSIVE METABOLIC PANEL
ALT: 16 U/L (ref 0–44)
AST: 23 U/L (ref 15–41)
Albumin: 4.1 g/dL (ref 3.5–5.0)
Alkaline Phosphatase: 63 U/L (ref 38–126)
Anion gap: 10 (ref 5–15)
BUN: 41 mg/dL — ABNORMAL HIGH (ref 8–23)
CO2: 27 mmol/L (ref 22–32)
Calcium: 9.8 mg/dL (ref 8.9–10.3)
Chloride: 100 mmol/L (ref 98–111)
Creatinine, Ser: 3.25 mg/dL — ABNORMAL HIGH (ref 0.61–1.24)
GFR, Estimated: 21 mL/min — ABNORMAL LOW (ref >60)
Glucose, Bld: 105 mg/dL — ABNORMAL HIGH (ref 70–99)
Potassium: 4.6 mmol/L (ref 3.5–5.1)
Sodium: 137 mmol/L (ref 135–145)
Total Bilirubin: 0.9 mg/dL (ref 0.3–1.2)
Total Protein: 7.3 g/dL (ref 6.5–8.1)

## 2020-10-21 LAB — LIPASE, BLOOD: Lipase: 25 U/L (ref 11–51)

## 2020-10-21 MED ORDER — HYDRALAZINE HCL 20 MG/ML IJ SOLN
10.0000 mg | Freq: Three times a day (TID) | INTRAMUSCULAR | Status: DC
  Administered 2020-10-22 – 2020-10-23 (×5): 10 mg via INTRAVENOUS
  Filled 2020-10-21 (×5): qty 1

## 2020-10-21 MED ORDER — IPRATROPIUM-ALBUTEROL 0.5-2.5 (3) MG/3ML IN SOLN: 3.0000 mL | RESPIRATORY_TRACT | Status: DC | PRN

## 2020-10-21 MED ORDER — LACTATED RINGERS IV SOLN: INTRAVENOUS | Status: AC

## 2020-10-21 MED ORDER — ONDANSETRON HCL 4 MG/2ML IJ SOLN
4.0000 mg | Freq: Four times a day (QID) | INTRAMUSCULAR | Status: DC | PRN
  Administered 2020-10-21 – 2020-10-25 (×5): 4 mg via INTRAVENOUS
  Filled 2020-10-21 (×5): qty 2

## 2020-10-21 MED ORDER — LACTATED RINGERS IV BOLUS
500.0000 mL | Freq: Once | INTRAVENOUS | Status: AC
  Administered 2020-10-22: 500 mL via INTRAVENOUS

## 2020-10-21 MED ORDER — CLONIDINE HCL 0.2 MG/24HR TD PTWK
0.2000 mg | MEDICATED_PATCH | TRANSDERMAL | Status: DC
  Administered 2020-10-22: 0.2 mg via TRANSDERMAL
  Filled 2020-10-21 (×2): qty 1

## 2020-10-21 MED ORDER — GUAIFENESIN-DM 100-10 MG/5ML PO SYRP: 10.0000 mL | ORAL_SOLUTION | ORAL | Status: DC | PRN

## 2020-10-21 MED ORDER — PANTOPRAZOLE SODIUM 40 MG IV SOLR
40.0000 mg | Freq: Two times a day (BID) | INTRAVENOUS | Status: DC
  Administered 2020-10-22 – 2020-10-25 (×8): 40 mg via INTRAVENOUS
  Filled 2020-10-21 (×8): qty 40

## 2020-10-21 MED ORDER — ONDANSETRON HCL 4 MG/2ML IJ SOLN
4.0000 mg | Freq: Once | INTRAMUSCULAR | Status: AC
Start: 2020-10-21 — End: 2020-10-21
  Administered 2020-10-21: 4 mg via INTRAVENOUS
  Filled 2020-10-21: qty 2

## 2020-10-21 MED ORDER — METOPROLOL TARTRATE 5 MG/5ML IV SOLN
5.0000 mg | Freq: Four times a day (QID) | INTRAVENOUS | Status: DC
  Administered 2020-10-22 – 2020-10-24 (×10): 5 mg via INTRAVENOUS
  Filled 2020-10-21 (×10): qty 5

## 2020-10-21 MED ORDER — ENOXAPARIN SODIUM 30 MG/0.3ML IJ SOSY
30.0000 mg | PREFILLED_SYRINGE | INTRAMUSCULAR | Status: DC
  Administered 2020-10-22 (×2): 30 mg via SUBCUTANEOUS
  Filled 2020-10-21 (×2): qty 0.3

## 2020-10-21 MED ORDER — DOCUSATE SODIUM 100 MG PO CAPS
100.0000 mg | ORAL_CAPSULE | Freq: Two times a day (BID) | ORAL | Status: DC | PRN
  Administered 2020-10-21: 100 mg via ORAL
  Filled 2020-10-21: qty 1

## 2020-10-21 NOTE — ED Notes (Signed)
Pt complains of his abd feeling distended. Pt was unable to eat lunch and is very uncomfortable. This RN notified EDP. Pt states he has periods of constipation. Has not had bowel movement in a few days. Gave pt colace.

## 2020-10-21 NOTE — H&P (Signed)
History and Physical    Peter Marsh QVZ:[id number] DOB: 1960/06/01 DOA: 10/19/2020  PCP: Vevelyn Francois, NP  Patient coming from: Home   Chief Complaint:  Chief Complaint  Patient presents with   Medical Clearance     HPI:    60 year old male with past medical history of schizophrenia, chronic kidney disease stage IV, hypertension, hyperlipidemia, paroxysmal atrial flutter (not on anticoagulation due to Hx GI bleeding), conduction system disease status post Medtronic dual-chamber pacemaker, asthma who initially presented to [facility] emergency department on 6/14 and for management of suicidal ideation in the setting of COVID infection.  Patient was initially seen and behavioral health on 6/13 for reported recent development of hearing voices over the past several days followed by development of suicidal ideation.  By 6/14, patient was sent to [facility] emergency department for further management due to the fact the patient's COVID-19 testing was positive.  Patient reported a 4-day history of shortness of breath cough and generalized malaise prior to his presentation at behavioral health.  Patient remained in the [facility] emergency department observation unit from 6/14 until 6/16.  On 6/16 the patient's symptoms of depression and suicidal ideation had resolved.  Patient contracted for safety.  Patient was cleared for discharge by psychiatry and his involuntary commitment was discontinued.  The evening of 6/15 however the patient began to develop generalized abdominal pain.  Patient describes his pain as sharp in quality and waxing and waning.  Pain was initially mild in intensity but progressively worsened overnight and into 6/16.  On 6/16 even as the patient has been cleared by psychiatry began to develop intense nausea and on the afternoon of 6/16 patient began to experience vomiting in addition to 1 episode of hematemesis shortly after an episode of  vomiting.  Upon further questioning patient also states that he has not passed flatus since very early in the day on 6/16.  I patient states he has not had a bowel movement in several days.  Patient was then reevaluated by the emergency department staff with CT imaging of the abdomen.  This revealed the patient had developed an ileus with small bowel obstruction not being excluded.  Due to the development of an ileus with ongoing nausea and vomiting and an episode of hematemesis hospitalist group is now been called to assess the patient for admission the hospital.  Review of Systems:   Review of Systems  Gastrointestinal:  Positive for abdominal pain, nausea and vomiting.       Hematemesis  Psychiatric/Behavioral:  Positive for depression and suicidal ideas.   All other systems reviewed and are negative.  Past Medical History:  Diagnosis Date   Allergy    Anxiety    Asthma    uses inhalers    Bilateral carotid bruits    Cardiac conduction disorder 2018   s/p MDT PPM   Cataract    Chronic kidney disease    bladder interstial cystitis    Chronic kidney disease (CKD), stage IV (severe) ([place])    followed by Dr. Joelyn Oms at [facility]   COPD (chronic obstructive pulmonary disease) ([place])    Depression    Encounter for care of pacemaker 02/13/2019   GERD (gastroesophageal reflux disease)    History of stomach ulcers 2001   Hypertension    LBBB (left bundle branch block)    Lower extremity edema    Mild intermittent asthma without complication    Mixed hyperlipidemia    Mobitz type  2 second degree AV block 04/06/2019   Neuropathy    Pacemaker: Medtronic Azure XT DR MRI [id number] PPM -  BUNDLE OF HIS pacing  02/26/2017   Scheduled Remote pacemaker check  11/12/2018:  There were 24 Fast AV episodes:  EGMs show SVTs. Episodes lasted < 2 minutes. Health trends do not demonstrate significant abnormality. Battery longevity is 9.4 - 10.3 years. RA pacing is 47.1 %, RV pacing is 40.4 %.   Clinic check 11/07/17.    Paroxysmal atrial flutter ([name])    PONV (postoperative nausea and vomiting)    Prostatitis    Recurrent upper respiratory infection (URI)    Schizophrenia ([name])    Sinus node dysfunction ([place]) 02/13/2019   Urticaria     Past Surgical History:  Procedure Laterality Date   ADENOIDECTOMY     APPENDECTOMY     COLONOSCOPY     ELECTROPHYSIOLOGY STUDY N/A 02/26/2017   Procedure: ELECTROPHYSIOLOGY STUDY;  Surgeon: Evans Lance, MD;  Location: [place] [facility];  Service: Cardiovascular;  Laterality: N/A;   ESOPHAGOGASTRODUODENOSCOPY (EGD) WITH PROPOFOL N/A 09/18/2016   Procedure: ESOPHAGOGASTRODUODENOSCOPY (EGD) WITH PROPOFOL;  Surgeon: Ronnette Juniper, MD;  Location: [place];  Service: Gastroenterology;  Laterality: N/A;   LUMBAR LAMINECTOMY/DECOMPRESSION MICRODISCECTOMY  04/19/2011   Procedure: LUMBAR LAMINECTOMY/DECOMPRESSION MICRODISCECTOMY;  Surgeon: Tobi Bastos;  Location: [facility];  Service: Orthopedics;  Laterality: Left;  Hemi LAminectomy/Microdiscectomy Lumbar four  - Lumbar five  on the Left (X-Ray)   PACEMAKER IMPLANT N/A 02/26/2017   Procedure: PACEMAKER IMPLANT;  Surgeon: Evans Lance, MD;  Location: [place] [facility];  Service: Cardiovascular;  Laterality: N/A;   TONSILLECTOMY     UPPER GASTROINTESTINAL ENDOSCOPY       reports that he quit smoking about 12 years ago. He has a 30.00 pack-year smoking history. He has never used smokeless tobacco. He reports that he does not drink alcohol and does not use drugs.  Allergies  Allergen Reactions   Methylpyrrolidone Hives    froze the intestine   Niacin Itching, Nausea And Vomiting and Other (See Comments)    Flushing, itching, tingling    Ace Inhibitors     Other reaction(s): CKD 4   Norvasc [Amlodipine Besylate] Other (See Comments)    Swollen Feet   Other Other (See Comments)   Oxybutynin Chloride [Oxybutynin Chloride Er] Other (See Comments)    froze the intestine   Vesicare [Solifenacin  Succinate] Other (See Comments)    Froze the intestine    Ciprofloxacin Rash and Other (See Comments)    Felt flushed    Oxybutynin Rash and Other (See Comments)    bowel obst   Solifenacin Rash    Family History  Problem Relation Age of Onset   High blood pressure Mother    Alzheimer's disease Father    Colon cancer Neg Hx    Esophageal cancer Neg Hx    Rectal cancer Neg Hx    Stomach cancer Neg Hx      Prior to Admission medications   Medication Sig Start Date End Date Taking? Authorizing Provider  acetaminophen (TYLENOL) 325 MG tablet Take 650 mg by mouth every 6 (six) hours as needed for mild pain or headache.   Yes [provider]  albuterol (VENTOLIN HFA) 108 (90 Base) MCG/ACT inhaler Inhale 2 puffs into the lungs every 6 (six) hours as needed for wheezing or shortness of breath. 09/08/20  Yes Ambs, Kathrine Cords, FNP  Budeson-Glycopyrrol-Formoterol (BREZTRI AEROSPHERE) 160-9-4.8 MCG/ACT AERO 2 puffs  twice a day with a spacer Patient taking differently: Inhale 2 puffs into the lungs 2 (two) times daily. with a spacer 09/08/20  Yes Ambs, Kathrine Cords, FNP  buPROPion [facility] SR) 150 MG 12 hr tablet Take 150 mg by mouth 2 (two) times daily.   Yes [provider]  cloNIDine (CATAPRES) 0.2 MG tablet Take 1 tablet (0.2 mg total) by mouth 2 (two) times daily. Please keep upcoming appt with Dr. Lovena Le in July 2022 before anymore refills. Thank you 10/18/20  Yes Evans Lance, MD  Cyanocobalamin (VITAMIN B-12 PO) Take 1 tablet by mouth daily.   Yes [provider]  ezetimibe (ZETIA) 10 MG tablet Take 1 tablet (10 mg total) by mouth daily. 05/21/20 10/19/20 Yes O'Neal, Cassie Freer, MD  Fenofibrate 50 MG CAPS Take 1 capsule (50 mg total) by mouth daily. 02/17/20 02/16/21 Yes King, Diona Foley, NP  fluticasone (FLONASE) 50 MCG/ACT nasal spray Place 1 spray into both nostrils daily as needed for allergies. 09/08/20  Yes Ambs, Kathrine Cords, FNP  furosemide (LASIX) 20 MG tablet Take 1  tablet (20 mg total) by mouth daily. 07/15/20  Yes O'Neal, Cassie Freer, MD  gabapentin (NEURONTIN) 100 MG capsule Take 1 capsule (100 mg total) by mouth 3 (three) times daily. 09/08/19  Yes Marcial Pacas, MD  hydrALAZINE (APRESOLINE) 50 MG tablet Take 1 tablet (50 mg total) by mouth 3 (three) times daily. 04/15/20  Yes O'Neal, Cassie Freer, MD  isosorbide dinitrate (ISORDIL) 30 MG tablet TAKE 1 TABLET(30 MG) BY MOUTH FOUR TIMES DAILY Patient taking differently: Take 30 mg by mouth 4 (four) times daily. 04/15/20  Yes O'Neal, Cassie Freer, MD  metoprolol tartrate (LOPRESSOR) 100 MG tablet Take 1 tablet (100 mg total) by mouth 2 (two) times daily. 04/15/20  Yes O'Neal, Cassie Freer, MD  montelukast (SINGULAIR) 10 MG tablet Take 1 tablet (10 mg total) by mouth at bedtime. 09/08/20 12/07/20 Yes Ambs, Kathrine Cords, FNP  paliperidone (INVEGA) 6 MG 24 hr tablet Take 6 mg by mouth at bedtime.   Yes [provider]  Pancrelipase, Lip-Prot-Amyl, (ZENPEP) 40000-126000 units CPEP Take 1 tablet by mouth with breakfast, with lunch, and with evening meal. 04/12/20  Yes Thornton Park, MD  pantoprazole (PROTONIX) 40 MG tablet Take 1 tablet (40 mg total) by mouth 2 (two) times daily. 04/12/20  Yes Thornton Park, MD  sucralfate (CARAFATE) 1 GM/10ML suspension Take 10 mLs (1 g total) by mouth 4 (four) times daily -  with meals and at bedtime. Patient taking differently: Take 1 g by mouth 3 (three) times daily as needed. 04/12/20  Yes Thornton Park, MD  benazepril (LOTENSIN) 10 MG tablet Take 1 tablet (10 mg total) by mouth daily. Patient not taking: Reported on 10/19/2020 04/15/19   Adrian Prows, MD  ondansetron (ZOFRAN ODT) 4 MG disintegrating tablet Take 1 tablet (4 mg total) by mouth every 8 (eight) hours as needed for nausea or vomiting. Patient not taking: No sig reported 07/11/20   Kinnie Feil, PA-C  tamsulosin (FLOMAX) 0.4 MG CAPS capsule Take 1 capsule (0.4 mg total) by mouth daily. Patient not taking: No  sig reported 02/12/20   Geralynn Rile, MD    Physical Exam: Vitals:   10/21/20 1730 10/21/20 1745 10/21/20 2200 10/21/20 2230  BP: (!) 143/86 (!) 140/92 (!) 192/98 (!) 168/119  Pulse: 64 66 (!) 105 (!) 137  Resp: 19 (!) 21 (!) 25 17  Temp:      TempSrc:  SpO2: 99% 100% 98% 100%  Weight:      Height:        Constitutional: Awake alert and oriented x3, patient is in mild distress due to abdominal pain.  Skin: no rashes, no lesions, poor skin turgor noted  eyes: Pupils are equally reactive to light.  No evidence of scleral icterus or conjunctival pallor.  ENMT: Dry mucous membranes noted.  Posterior pharynx clear of any exudate or lesions.   Neck: normal, supple, no masses, no thyromegaly.  No evidence of jugular venous distension.   Respiratory: Intermittent expiratory wheezing noted in all fields, no wheezing, no crackles. Normal respiratory effort. No accessory muscle use.  Cardiovascular: Regular rate and rhythm, no murmurs / rubs / gallops. No extremity edema. 2+ pedal pulses. No carotid bruits.  Chest:   Nontender without crepitus or deformity.   Back:   Nontender without crepitus or deformity. Abdomen: Abdomen is protuberant but soft.  Patient possesses generalized abdominal tenderness.  Bowel sounds are hypoactive.  No evidence of intra-abdominal masses.  Positive bowel sounds noted in all quadrants.   Musculoskeletal: No joint deformity upper and lower extremities. Good ROM, no contractures. Normal muscle tone.  Neurologic: CN 2-12 grossly intact. Sensation intact.  Patient moving all 4 extremities spontaneously.  Patient is following all commands.  Patient is responsive to verbal stimuli.   Psychiatric: Patient exhibits somewhat depressed mood with flat affect.  Patient now denies suicidal or homicidal ideation and contracts for safety.  Patient seems to possess insight as to their current situation.     Labs on Admission: I have personally reviewed following labs and  imaging studies -   CBC: Recent Labs  Lab 10/19/20 0825 10/21/20 1536  WBC 4.7 9.5  NEUTROABS 2.3 7.6  HGB 13.6 15.6  HCT 42.5 48.6  MCV 88.4 88.4  PLT 165 476   Basic Metabolic Panel: Recent Labs  Lab 10/19/20 0825 10/21/20 1536  NA 136 137  K 4.4 4.6  CL 107 100  CO2 23 27  GLUCOSE 95 105*  BUN 28* 41*  CREATININE 2.50* 3.25*  CALCIUM 8.8* 9.8   GFR: Estimated Creatinine Clearance: 29.7 mL/min (A) (by C-G formula based on SCr of 3.25 mg/dL (H)). Liver Function Tests: Recent Labs  Lab 10/19/20 0825 10/21/20 1536  AST 21 23  ALT 16 16  ALKPHOS 59 63  BILITOT 0.5 0.9  PROT 6.2* 7.3  ALBUMIN 3.6 4.1   Recent Labs  Lab 10/21/20 1536  LIPASE 25   No results for input(s): AMMONIA in the last 168 hours. Coagulation Profile: No results for input(s): INR, PROTIME in the last 168 hours. Cardiac Enzymes: No results for input(s): CKTOTAL, CKMB, CKMBINDEX, TROPONINI in the last 168 hours. BNP (last 3 results) No results for input(s): PROBNP in the last 8760 hours. HbA1C: No results for input(s): HGBA1C in the last 72 hours. CBG: No results for input(s): GLUCAP in the last 168 hours. Lipid Profile: No results for input(s): CHOL, HDL, LDLCALC, TRIG, CHOLHDL, LDLDIRECT in the last 72 hours. Thyroid Function Tests: No results for input(s): TSH, T4TOTAL, FREET4, T3FREE, THYROIDAB in the last 72 hours. Anemia Panel: No results for input(s): VITAMINB12, FOLATE, FERRITIN, TIBC, IRON, RETICCTPCT in the last 72 hours. Urine analysis:    Component Value Date/Time   COLORURINE YELLOW 07/11/2020 0830   APPEARANCEUR CLEAR 07/11/2020 0830   LABSPEC 1.023 07/11/2020 0830   PHURINE 6.0 07/11/2020 0830   GLUCOSEU NEGATIVE 07/11/2020 0830   HGBUR NEGATIVE 07/11/2020 0830  BILIRUBINUR neg 09/20/2020 1024   KETONESUR NEGATIVE 07/11/2020 0830   PROTEINUR Positive (A) 09/20/2020 1024   PROTEINUR 100 (A) 07/11/2020 0830   UROBILINOGEN 0.2 09/20/2020 1024   UROBILINOGEN 0.2  04/17/2011 1030   NITRITE neg 09/20/2020 1024   NITRITE NEGATIVE 07/11/2020 0830   LEUKOCYTESUR Negative 09/20/2020 1024   LEUKOCYTESUR NEGATIVE 07/11/2020 0830    Radiological Exams on Admission - Personally Reviewed: CT Abdomen Pelvis Wo Contrast  Result Date: 10/21/2020 CLINICAL DATA:  60 year old male with abdominal pain. Concern for bowel obstruction. EXAM: CT ABDOMEN AND PELVIS WITHOUT CONTRAST TECHNIQUE: Multidetector CT imaging of the abdomen and pelvis was performed following the standard protocol without IV contrast. COMPARISON:  CT of the abdomen pelvis dated 06/17/2016. FINDINGS: Evaluation of this exam is limited in the absence of intravenous contrast. Lower chest: The visualized lung bases are clear. Cardiac pacemaker wires noted. No intra-abdominal free air or free fluid. Hepatobiliary: The liver is unremarkable. No intrahepatic biliary dilatation. Small gallstone. No pericholecystic fluid or evidence of acute cholecystitis by CT. Pancreas: Scattered pancreatic calcification, sequela of prior pancreatitis. No active inflammatory changes. Spleen: Normal in size without focal abnormality. Adrenals/Urinary Tract: The adrenal glands are unremarkable. Mild left and severe right renal parenchyma atrophy. 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 There is moderate hydronephrosis of the upper pole moiety. Two adjacent stones noted in the upper pole collecting system each measuring approximately 9 mm and combined length of approximately 15 mm. There is severe hydronephrosis of the inferior pole moiety versus less likely a parapelvic cyst. This is similar to prior CT. A 7 mm calcification noted along the inferior aspect of dilated inferior pole moiety adjacent to the ureteropelvic junction. Several nonobstructing left renal calculi measure up to 9 mm in the inferior pole of the left kidney. No hydronephrosis on the left. A 4.7 cm hypodense lesion with fluid attenuation  arising from the superior pole of the left kidney has increased in size since the prior CT. This likely represent cysts but suboptimally characterized on this noncontrast CT. Ultrasound may provide better evaluation. The visualized ureters appear unremarkable. There is a 5 mm stone in the distal right ureter adjacent to the right ureterovesical junction. Stomach/Bowel: There is moderate stool throughout the colon. Multiple mildly dilated fluid-filled loops of small bowel measure up to 4 cm in the left anterior abdomen. The terminal ileum and distal small bowel demonstrate a normal caliber. No definite transition identified. Although findings may represent enteritis and mild ileus an early obstruction is not excluded. Clinical correlation is recommended. Appendectomy. Vascular/Lymphatic: Moderate aortoiliac atherosclerotic disease. There is a 3.2 cm infrarenal aortic aneurysm, new since the prior CT. The IVC is unremarkable. No portal venous gas. There is no adenopathy. Reproductive: Prostate and seminal vesicles are grossly unremarkable. No pelvic mass. Other: None Musculoskeletal: Degenerative changes of the spine. No acute osseous pathology. IMPRESSION: 1. Probable enteritis with mild ileus. An early obstruction is not excluded. Clinical correlation is recommended. 2. 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 3. Several nonobstructing bilateral renal calculi and a 5 mm distal right ureteral/right UVJ calculus. No hydronephrosis on the left. 4. A 3.2 cm infrarenal aortic aneurysm, new since the prior CT.Recommend follow-up ultrasound every 3 years. This recommendation follows ACR consensus guidelines: White Paper of the ACR Incidental Findings Committee II on Vascular Findings. J Am Coll Radiol 2013; 10:789-794. 5. Cholelithiasis. 6. Aortic Atherosclerosis (ICD10-I70.0). Electronically Signed  By: Anner Crete M.D.   On: 10/21/2020 18:35    DG Abd Acute W/Chest  Result Date: 10/21/2020 CLINICAL DATA:  Abdominal pain EXAM: DG ABDOMEN ACUTE WITH 1 VIEW CHEST COMPARISON:  Chest x-ray 11/19/2018 FINDINGS: Heart size and vascularity normal. Lungs clear without infiltrate or effusion. Dual lead pacemaker unchanged. Mildly distended large and small bowel loops present diffusely. There is gas and stool in the rectum. No free air. IMPRESSION: No acute cardiopulmonary abnormality Mildly distended large and small bowel loops consistent with ileus. Electronically Signed   By: Franchot Gallo M.D.   On: 10/21/2020 14:41    EKG: Personally reviewed.  Rhythm is is rhythm with heart rate of 69 bpm.  Evidence of left bundle branch block.  no dynamic ST segment changes appreciated.  Assessment/Plan Principal Problem:   Ileus ([place])  Patient exhibiting a 24-hour history of increasing abdominal pain nausea and vomiting with abdominal distention. CT imaging consistent with ileus and possible bowel obstruction Patient reports having a similar episode of small bowel obstruction in 2009 that was managed conservatively NG tube placed due to ongoing vomiting in the emergency department now set to low intermittent suction. N.p.o. for now Serial abdominal imaging Hydrating patient with intravenous isotonic fluids Monitoring electrolytes and replacing as necessary If patient does not rapidly clinically improve will consult general surgery tomorrow on day shift  Active Problems:   Hematemesis  1 episode of bright red hematemesis that occurred in the midst of vomiting Likely a Mallory-Weiss tear If hematemesis persists will perform serial CBCs and consider GI consultation Currently on IV PPI twice daily, patient was already on a p.o. PPI twice daily prior to admission.    Acute renal failure superimposed on stage 4 chronic kidney disease ([place])  Acute kidney injury likely secondary to volume depletion superimposed on chronic kidney disease stage  IV Hydrating patient with intravenous isotonic fluids Strict input and output monitoring Minimizing nephrotoxic agents is much as possible Holding home regimen of ACE inhibitor Monitor renal function and electrolytes with serial chemistries    COVID-19 virus infection  Patient experienced 4 days of shortness of breath, weakness and cough prior to being diagnosed with COVID on 6/13 via PCR Today on 6/16 patient states that his symptoms have improved  patient exhibits no evidence of oxygen requirement Patient still requires isolation however and is currently in droplet and contact precautions I do not believe that any treatment such as remdesivir is necessary due to lack of hypoxia and the fact the patient is symptoms are already improving Continue supportive care    Essential hypertension  While patient is n.p.o. we will manage patient with scheduled intravenous metoprolol and hydralazine. I transitioned patient's oral clonidine regimen to a clonidine patch for now. As needed additional intravenous antihypertensives for markedly elevated blood pressure.    GERD (gastroesophageal reflux disease)  Currently on IV Protonix twice daily    Schizoaffective disorder, bipolar type ([place])  We will resume psychotropic medications as recommended by psychiatry once patient is able to tolerate oral intake    Aneurysm of infrarenal abdominal aorta ([name])  Incidental finding, 3.2 cm on CT imaging. Outpatient follow-up for surveillance   Code Status:  Full code Family Communication: deferred   Status is: Observation  The patient remains OBS appropriate and will d/c before 2 midnights.  Dispo: The patient is from: Home              Anticipated d/c is to: Home  Patient currently is not medically stable to d/c.   Difficult to place patient No        Vernelle Emerald MD [facility] Pager [phone]  If 7PM-7AM, please contact night-coverage [email or web address] Use  universal [place] password for that web site. If you do not have the password, please call the hospital operator.  10/21/2020, 10:47 PM

## 2020-10-21 NOTE — ED Notes (Signed)
[name], wife, [phone] would like an update when available

## 2020-10-21 NOTE — ED Notes (Signed)
Breakfast Ordered 

## 2020-10-21 NOTE — ED Notes (Signed)
Sent provider message reference pt being NPO including meds or not

## 2020-10-21 NOTE — ED Notes (Signed)
Report received from [place] at this time

## 2020-10-21 NOTE — Consult Note (Signed)
Telepsych Consultation   Reason for Consult:  Consult Referring Physician:  Dr. Sherwood Marsh  Location of Patient: [facility] Location of Provider: Other: [facility]  Patient Identification: Peter Marsh MRN:  [id number] Principal Diagnosis: Schizoaffective disorder, bipolar type ([place]) Diagnosis:  Principal Problem:   Schizoaffective disorder, bipolar type ([place])   Total Time spent with patient: 30 minutes  Subjective:   Peter Marsh is a 60 y.o. male patient came to [facility] with complaints of hearing voices telling him to kill others then himself.  Pt has had problems with the neighbors.  Today he had gotten a knife from the kitchen and had thoughts of going to neighbors and "cutting loose."  Pt broke down crying on the floor and his wife brought him to [facility].  Pt says he has been having thoughts of hanging himself.  He hears voices telling him to do so. Pt sometimes sees dead relatives beckoning to him from the grave  HPI:  Patient seen via tele health by this provider; chart reviewed and consulted with Dr. Dwyane Dee on 10/21/20.  On evaluation [facility] reports  "I feel better." When asked what brought him to the hospital, he states "Covid and not taking my psychiatric medications. I have stage four kidney disease, a pace maker, liver and kidney disease. I thought that if I stopped taking my medications I would feel better. I went two weeks without sleeping and voices came along with that." When asked if he's having thoughts to kill himself, suicidal thoughts, he states "no." When asked if he's having thoughts to harm, or kill anyone else, including his neighbors, he states "no, that was because I wasn't taking my medications."  "I am not wanting to hurt anyone. I wasn't getting sleep." When asked if he's hearing voices or seeing things that other people can't hear or see, he states, "no, that started last month because I stopped taking my medications. I don't have that problem now."  He  states that he is sleeping better now, on average 12 hours. He reports having a fair appetite until today when his stomach starting hurting and feeling "bloated."   Patient states that he lives with his wife and identifies his wife Peter Marsh and Peter Marsh (Sherilyn Banker at [employer]) as his support persons. He denies having access to weapons in the home and states they do not have guns in the home.   During evaluation [name] is supine position in bed looking at the camera. He is alert and oriented to person, place and time. He is calm/cooperative. His mood is euthymic and congruent with affect. He is speaking in a clear tone at moderate volume, and normal pace; with good eye contact. His thought process is coherent and relevant. He has good insight and is goal oriented. He states that he will continue taking his psychiatric medications and will follow up with [facility]. He states that he also plans to attend counseling with this wife. There is no indication that he is currently responding to internal/external stimuli or experiencing delusional thought content. He denies suicidal/self-harm/homicidal ideation, psychosis, and paranoia. Patient has remained calm throughout assessment and has answered questions appropriately.    Collateral: Mrs. Peter Marsh, states that she spoke with the patient today and he is doing a lot better. She states that she thinks he was feeling that way because he wasn't taking his medications. She states that she would love to have her husband come home today. She states that she spoke with  the [place] about staying home with her husband and getting Covid tested. She states that they do not have any weapons in the home. She states that she can keep her husband safe at home.   Past Psychiatric History: Schizoaffective disorder, bipolar type. MDD    Risk to Self:  denies  Risk to Others:  denies  Prior Inpatient Therapy:  No Prior Outpatient Therapy:  Yes  Past Medical History:   Past Medical History:  Diagnosis Date   Allergy    Anxiety    Asthma    uses inhalers    Bilateral carotid bruits    Cardiac conduction disorder 2018   s/p MDT PPM   Cataract    Chronic kidney disease    bladder interstial cystitis    Chronic kidney disease (CKD), stage IV (severe) ([place])    followed by Dr. Joelyn Oms at [facility]   COPD (chronic obstructive pulmonary disease) ([place])    Depression    Encounter for care of pacemaker 02/13/2019   GERD (gastroesophageal reflux disease)    History of stomach ulcers 2001   Hypertension    LBBB (left bundle branch block)    Lower extremity edema    Mild intermittent asthma without complication    Mixed hyperlipidemia    Mobitz type 2 second degree AV block 04/06/2019   Neuropathy    Pacemaker: Medtronic Azure XT DR MRI [id number] PPM -  BUNDLE OF HIS pacing  02/26/2017   Scheduled Remote pacemaker check  11/12/2018:  There were 24 Fast AV episodes:  EGMs show SVTs. Episodes lasted < 2 minutes. Health trends do not demonstrate significant abnormality. Battery longevity is 9.4 - 10.3 years. RA pacing is 47.1 %, RV pacing is 40.4 %.  Clinic check 11/07/17.    Paroxysmal atrial flutter ([name])    PONV (postoperative nausea and vomiting)    Prostatitis    Recurrent upper respiratory infection (URI)    Schizophrenia ([name])    Sinus node dysfunction ([place]) 02/13/2019   Urticaria     Past Surgical History:  Procedure Laterality Date   ADENOIDECTOMY     APPENDECTOMY     COLONOSCOPY     ELECTROPHYSIOLOGY STUDY N/A 02/26/2017   Procedure: ELECTROPHYSIOLOGY STUDY;  Surgeon: Evans Lance, MD;  Location: [facility];  Service: Cardiovascular;  Laterality: N/A;   ESOPHAGOGASTRODUODENOSCOPY (EGD) WITH PROPOFOL N/A 09/18/2016   Procedure: ESOPHAGOGASTRODUODENOSCOPY (EGD) WITH PROPOFOL;  Surgeon: Ronnette Juniper, MD;  Location: [place];  Service: Gastroenterology;  Laterality: N/A;   LUMBAR LAMINECTOMY/DECOMPRESSION MICRODISCECTOMY  04/19/2011    Procedure: LUMBAR LAMINECTOMY/DECOMPRESSION MICRODISCECTOMY;  Surgeon: Tobi Bastos;  Location: [facility];  Service: Orthopedics;  Laterality: Left;  Hemi LAminectomy/Microdiscectomy Lumbar four  - Lumbar five  on the Left (X-Ray)   PACEMAKER IMPLANT N/A 02/26/2017   Procedure: PACEMAKER IMPLANT;  Surgeon: Evans Lance, MD;  Location: [place] [facility];  Service: Cardiovascular;  Laterality: N/A;   TONSILLECTOMY     UPPER GASTROINTESTINAL ENDOSCOPY     Family History:  Family History  Problem Relation Age of Onset   High blood pressure Mother    Alzheimer's disease Father    Colon cancer Neg Hx    Esophageal cancer Neg Hx    Rectal cancer Neg Hx    Stomach cancer Neg Hx    Family Psychiatric  History: Unknown Social History:  Social History   Substance and Sexual Activity  Alcohol Use No     Social History   Substance and  Sexual Activity  Drug Use No    Social History   Socioeconomic History   Marital status: Married    Spouse name: Not on file   Number of children: 1   Years of education: 12   Highest education level: High school graduate  Occupational History   Occupation: Retired  Tobacco Use   Smoking status: Former    Packs/day: 1.00    Years: 30.00    Pack years: 30.00    Types: Cigarettes    Quit date: 05/08/2008    Years since quitting: 12.4   Smokeless tobacco: Never  Vaping Use   Vaping Use: Never used  Substance and Sexual Activity   Alcohol use: No   Drug use: No   Sexual activity: Never  Other Topics Concern   Not on file  Social History Narrative   Lives at home with wife.   Right-handed.   One cup caffeine per day.   Social Determinants of Health   Financial Resource Strain: Not on file  Food Insecurity: Not on file  Transportation Needs: Not on file  Physical Activity: Not on file  Stress: Not on file  Social Connections: Not on file   Additional Social History:    Allergies:   Allergies  Allergen Reactions    Methylpyrrolidone Hives    froze the intestine   Niacin Itching, Nausea And Vomiting and Other (See Comments)    Flushing, itching, tingling    Ace Inhibitors     Other reaction(s): CKD 4   Norvasc [Amlodipine Besylate] Other (See Comments)    Swollen Feet   Other Other (See Comments)   Oxybutynin Chloride [Oxybutynin Chloride Er] Other (See Comments)    froze the intestine   Vesicare [Solifenacin Succinate] Other (See Comments)    Froze the intestine    Ciprofloxacin Rash and Other (See Comments)    Felt flushed    Oxybutynin Rash and Other (See Comments)    bowel obst   Solifenacin Rash    Labs: No results found for this or any previous visit (from the past 48 hour(s)).  Medications:  Current Facility-Administered Medications  Medication Dose Route Frequency Provider Last Rate Last Admin   acetaminophen (TYLENOL) tablet 650 mg  650 mg Oral Q4H PRN Peter Gambler, MD   650 mg at 10/20/20 0605   albuterol (VENTOLIN HFA) 108 (90 Base) MCG/ACT inhaler 2 puff  2 puff Inhalation Q6H PRN Peter Gambler, MD   2 puff at 10/21/20 0443   benazepril (LOTENSIN) tablet 10 mg  10 mg Oral Daily Peter Gambler, MD   10 mg at 10/21/20 1013   buPROPion (WELLBUTRIN SR) 12 hr tablet 150 mg  150 mg Oral BID Peter Gambler, MD   150 mg at 10/21/20 0948   cloNIDine (CATAPRES) tablet 0.2 mg  0.2 mg Oral BID Peter Gambler, MD   0.2 mg at 10/21/20 0948   docusate sodium (COLACE) capsule 100 mg  100 mg Oral BID PRN Arnaldo Natal, MD   100 mg at 10/21/20 1244   fenofibrate tablet 54 mg  54 mg Oral Daily Peter Gambler, MD   54 mg at 10/21/20 1012   fluticasone (FLONASE) 50 MCG/ACT nasal spray 1 spray  1 spray Each Nare Daily PRN Peter Gambler, MD       furosemide (LASIX) tablet 20 mg  20 mg Oral Daily Peter Gambler, MD   20 mg at 10/21/20 0948   gabapentin (NEURONTIN) capsule 100 mg  100 mg Oral TID Regenia Skeeter,  Nicki Reaper, MD   100 mg at 10/21/20 0948   hydrALAZINE (APRESOLINE) tablet 50 mg  50 mg  Oral TID Peter Gambler, MD   50 mg at 10/21/20 0947   isosorbide dinitrate (ISORDIL) tablet 30 mg  30 mg Oral QID Peter Gambler, MD   30 mg at 10/21/20 1313   lipase/protease/amylase (CREON) capsule 36,000 Units  36,000 Units Oral TID WC Peter Gambler, MD   36,000 Units at 10/21/20 1245   metoprolol tartrate (LOPRESSOR) tablet 100 mg  100 mg Oral BID Peter Gambler, MD   100 mg at 10/21/20 0948   mometasone-formoterol (DULERA) 100-5 MCG/ACT inhaler 2 puff  2 puff Inhalation BID Peter Gambler, MD   2 puff at 10/20/20 2223   And   umeclidinium bromide (INCRUSE ELLIPTA) 62.5 MCG/INH 1 puff  1 puff Inhalation Daily Peter Gambler, MD   1 puff at 10/21/20 0951   montelukast (SINGULAIR) tablet 10 mg  10 mg Oral QHS Peter Gambler, MD   10 mg at 10/20/20 2227   ondansetron (ZOFRAN-ODT) disintegrating tablet 4 mg  4 mg Oral Q8H PRN Peter Gambler, MD       paliperidone (INVEGA) 24 hr tablet 6 mg  6 mg Oral QHS Peter Gambler, MD   6 mg at 10/20/20 2225   pantoprazole (PROTONIX) EC tablet 40 mg  40 mg Oral BID Peter Gambler, MD   40 mg at 10/21/20 0948   sucralfate (CARAFATE) 1 GM/10ML suspension 1 g  1 g Oral TID WC & HS Peter Gambler, MD   1 g at 10/21/20 1245   tamsulosin (FLOMAX) capsule 0.4 mg  0.4 mg Oral Daily Peter Gambler, MD   0.4 mg at 10/20/20 1043   vitamin B-12 (CYANOCOBALAMIN) tablet 100 mcg  100 mcg Oral Daily Peter Gambler, MD   100 mcg at 10/21/20 1017   Current Outpatient Medications  Medication Sig Dispense Refill   acetaminophen (TYLENOL) 325 MG tablet Take 650 mg by mouth every 6 (six) hours as needed for mild pain or headache.     albuterol (VENTOLIN HFA) 108 (90 Base) MCG/ACT inhaler Inhale 2 puffs into the lungs every 6 (six) hours as needed for wheezing or shortness of breath. 18 g 1   Budeson-Glycopyrrol-Formoterol (BREZTRI AEROSPHERE) 160-9-4.8 MCG/ACT AERO 2 puffs twice a day with a spacer (Patient taking differently: Inhale 2 puffs into the lungs 2 (two)  times daily. with a spacer) 10.7 g 2   buPROPion (WELLBUTRIN SR) 150 MG 12 hr tablet Take 150 mg by mouth 2 (two) times daily.     cloNIDine (CATAPRES) 0.2 MG tablet Take 1 tablet (0.2 mg total) by mouth 2 (two) times daily. Please keep upcoming appt with Dr. Lovena Le in July 2022 before anymore refills. Thank you 60 tablet 1   Cyanocobalamin (VITAMIN B-12 PO) Take 1 tablet by mouth daily.     ezetimibe (ZETIA) 10 MG tablet Take 1 tablet (10 mg total) by mouth daily. 90 tablet 3   Fenofibrate 50 MG CAPS Take 1 capsule (50 mg total) by mouth daily. 30 capsule 11   fluticasone (FLONASE) 50 MCG/ACT nasal spray Place 1 spray into both nostrils daily as needed for allergies. 11.1 mL 3   furosemide (LASIX) 20 MG tablet Take 1 tablet (20 mg total) by mouth daily. 30 tablet 3   gabapentin (NEURONTIN) 100 MG capsule Take 1 capsule (100 mg total) by mouth 3 (three) times daily. 90 capsule 11   hydrALAZINE (APRESOLINE) 50 MG tablet Take 1 tablet (50  mg total) by mouth 3 (three) times daily. 270 tablet 1   isosorbide dinitrate (ISORDIL) 30 MG tablet TAKE 1 TABLET(30 MG) BY MOUTH FOUR TIMES DAILY (Patient taking differently: Take 30 mg by mouth 4 (four) times daily.) 360 tablet 1   metoprolol tartrate (LOPRESSOR) 100 MG tablet Take 1 tablet (100 mg total) by mouth 2 (two) times daily. 180 tablet 1   montelukast (SINGULAIR) 10 MG tablet Take 1 tablet (10 mg total) by mouth at bedtime. 90 tablet 1   paliperidone (INVEGA) 6 MG 24 hr tablet Take 6 mg by mouth at bedtime.     Pancrelipase, Lip-Prot-Amyl, (ZENPEP) 40000-126000 units CPEP Take 1 tablet by mouth with breakfast, with lunch, and with evening meal. 90 capsule 2   pantoprazole (PROTONIX) 40 MG tablet Take 1 tablet (40 mg total) by mouth 2 (two) times daily. 180 tablet 3   sucralfate (CARAFATE) 1 GM/10ML suspension Take 10 mLs (1 g total) by mouth 4 (four) times daily -  with meals and at bedtime. (Patient taking differently: Take 1 g by mouth 3 (three) times  daily as needed.) 3600 mL 3   benazepril (LOTENSIN) 10 MG tablet Take 1 tablet (10 mg total) by mouth daily. (Patient not taking: Reported on 10/19/2020) 90 tablet 1   ondansetron (ZOFRAN ODT) 4 MG disintegrating tablet Take 1 tablet (4 mg total) by mouth every 8 (eight) hours as needed for nausea or vomiting. (Patient not taking: No sig reported) 20 tablet 0   tamsulosin (FLOMAX) 0.4 MG CAPS capsule Take 1 capsule (0.4 mg total) by mouth daily. (Patient not taking: No sig reported) 90 capsule 2   Facility-Administered Medications Ordered in Other Encounters  Medication Dose Route Frequency Provider Last Rate Last Admin   acetaminophen (OFIRMEV) IVPB    PRN Lissa Morales, CRNA   1,000 mg at 04/19/11 0910   ePHEDrine injection    PRN Lissa Morales, CRNA   5 mg at 04/19/11 1018   glycopyrrolate (ROBINUL) injection    PRN Lissa Morales, CRNA   0.8 mg at 04/19/11 1037   SUFentanil (SUFENTA) injection    PRN Lissa Morales, CRNA   10 mcg at 04/19/11 1008    Musculoskeletal: Strength & Muscle Tone:  [place]:  UTA Patient leans: N/A  Psychiatric Specialty Exam:  Presentation  General Appearance: Appropriate for Environment  Eye Contact:Fair  Speech:Clear and Coherent  Speech Volume:Normal  Handedness:Right   Mood and Affect  Mood:Euthymic  Affect:Appropriate   Thought Process  Thought Processes:Coherent  Descriptions of Associations:Intact  Orientation:Full (Time, Place and Person)  Thought Content:WDL  History of Schizophrenia/Schizoaffective disorder:Yes  Duration of Psychotic Symptoms:Greater than six months  Hallucinations:Hallucinations: None  Ideas of Reference:None  Suicidal Thoughts:Suicidal Thoughts: No  Homicidal Thoughts:Homicidal Thoughts: No   Sensorium  Memory:Immediate Fair; Recent Fair; Remote Fair  Judgment:Fair  Insight:Fair   Executive Functions  Concentration:Fair  Attention Span:Fair  [place]   Psychomotor Activity  Psychomotor Activity:Psychomotor Activity: Normal   Assets  Assets:Desire for Improvement; Housing; Intimacy; Leisure Time; Armed forces logistics/support/administrative officer; Social Support   Sleep  Sleep:Sleep: Fair Number of Hours of Sleep: 12    Physical Exam: Physical Exam HENT:     Head: Normocephalic.  Cardiovascular:     Rate and Rhythm: Normal rate.  Neurological:     Mental Status: He is alert.  Psychiatric:        Attention and Perception: Attention normal.  Mood and Affect: Mood normal.        Speech: Speech normal.        Behavior: Behavior normal.        Thought Content: Thought content normal.        Judgment: Judgment normal.   Review of Systems  Constitutional: Negative.   HENT: Negative.    Eyes: Negative.   Respiratory: Negative.    Cardiovascular: Negative.   Gastrointestinal:  Positive for abdominal pain.  Genitourinary: Negative.   Musculoskeletal: Negative.   Skin: Negative.   Neurological: Negative.   Endo/Heme/Allergies: Negative.   Psychiatric/Behavioral:  Negative for depression, hallucinations, memory loss, substance abuse and suicidal ideas. The patient is not nervous/anxious and does not have insomnia.   Blood pressure 132/88, pulse 61, temperature 98.8 F (37.1 C), temperature source Oral, resp. rate 18, height 6\' 1"  (1.854 m), weight 99.8 kg, SpO2 95 %. Body mass index is 29.03 kg/m.  Treatment Plan Summary: Plan : Patient is psychiatrically cleared.  Continue taking psychotropics as prescribed.  Disposition: No evidence of imminent risk to self or others at present.   Patient does not meet criteria for psychiatric inpatient admission. Supportive therapy provided about ongoing stressors. Discussed crisis plan, support from social network, calling 911, coming to the Emergency Department, and calling Suicide Hotline.  Safety Plan [name] will reach out to [place]) or his wife  Peter Marsh, call 911 or call mobile crisis if condition worsens or if suicidal thoughts become active Patients' will follow up [facility] for outpatient psychiatric services (therapy/medication management).  The suicide prevention education provided includes the following: Suicide risk factors Suicide prevention and interventions National Suicide Hotline telephone number [facility] assessment telephone number [facility] Emergency Assistance [place] and/or Residential Mobile Crisis Unit telephone number Request made of family/significant other to:  Vianne Bulls weapons (e.g., guns, rifles, knives), all items previously/currently identified as safety concern.   Remove drugs/medications (over the counter, prescriptions, illicit drugs), all items previously/currently identified as a safety concern.    This service was provided via telemedicine using a 2-way, interactive audio and video technology.  Names of all persons participating in this telemedicine service and their role in this encounter. Name: Kendra Opitz  Role: Patient   Name: Darrol Angel Role: NP  Name: Dr. Dwyane Dee  Role: Psychiatrist   Name: Claud Kelp (via telephone) Role: Wife   Disposition given to Dr. Lorre Munroe, MD., that the patient case has been discussed with Dr. Dwyane Dee and the patient is psych cleared.   Marissa Calamity, NP 10/21/2020 3:17 PM

## 2020-10-21 NOTE — Consult Note (Signed)
A secure chart was sent to nurse Moon at 1300 to assess patient, no response.

## 2020-10-21 NOTE — Consult Note (Signed)
Disposition: Patient is psychiatrically cleared. Dr. Joya Gaskins given dispo. Wife notified and my be contacted at [phone] or [phone]

## 2020-10-21 NOTE — ED Provider Notes (Signed)
[facility] EMERGENCY DEPARTMENT Provider Note   CSN: [id number] Arrival date & time: 10/19/20  0740     History Chief Complaint  Patient presents with   Medical Clearance    Peter Marsh is a 60 y.o. male.  Peter Marsh was transferred from behavioral health to the main ED for abdominal pain, nausea, and vomiting.  I saw him earlier today on rounds, and he was doing well.  However, throughout the day, he started to experience more more abdominal pain and bloating.  He has had constipation for several days, and he has been unable to have more than a small bowel movement.  He began to have nausea and vomiting, and he had some hematemesis after he vomited.  Therefore, he was sent here for further evaluation.  The history is provided by the patient.  Abdominal Pain Pain location:  Generalized Pain quality: bloating   Pain radiates to:  Does not radiate Pain severity:  Moderate Onset quality:  Gradual Duration:  1 day Timing:  Constant Progression:  Improving Chronicity:  New Context: not recent illness, not sick contacts and not trauma   Relieved by:  Nothing Worsened by:  Eating Ineffective treatments: stool softeners. Associated symptoms: constipation, hematemesis, nausea and vomiting   Associated symptoms: no chest pain, no chills, no cough, no dysuria, no fever, no hematuria, no shortness of breath and no sore throat       Past Medical History:  Diagnosis Date   Allergy    Anxiety    Asthma    uses inhalers    Bilateral carotid bruits    Cardiac conduction disorder 2018   s/p MDT PPM   Cataract    Chronic kidney disease    bladder interstial cystitis    Chronic kidney disease (CKD), stage IV (severe) ([name])    followed by Dr. Joelyn Oms at [facility]   COPD (chronic obstructive pulmonary disease) ([place])    Depression    Encounter for care of pacemaker 02/13/2019   GERD (gastroesophageal reflux disease)    History of stomach ulcers 2001    Hypertension    LBBB (left bundle branch block)    Lower extremity edema    Mild intermittent asthma without complication    Mixed hyperlipidemia    Mobitz type 2 second degree AV block 04/06/2019   Neuropathy    Pacemaker: Medtronic Azure XT DR MRI [id number] PPM -  BUNDLE OF HIS pacing  02/26/2017   Scheduled Remote pacemaker check  11/12/2018:  There were 24 Fast AV episodes:  EGMs show SVTs. Episodes lasted < 2 minutes. Health trends do not demonstrate significant abnormality. Battery longevity is 9.4 - 10.3 years. RA pacing is 47.1 %, RV pacing is 40.4 %.  Clinic check 11/07/17.    Paroxysmal atrial flutter ([name])    PONV (postoperative nausea and vomiting)    Prostatitis    Recurrent upper respiratory infection (URI)    Schizophrenia ([name])    Sinus node dysfunction ([place]) 02/13/2019   Urticaria     Patient Active Problem List   Diagnosis Date Noted   Allergic rhinitis due to pollen 09/22/2020   Chronic obstructive pulmonary disease, unspecified ([place]) 09/22/2020   Chronic pancreatitis ([place]) 09/22/2020   Gastric ulcer, unspecified as acute or chronic, without hemorrhage or perforation 09/22/2020   History of adenomatous polyp of colon 09/22/2020   Hyperglycemia 09/22/2020   Left bundle branch block 09/22/2020   Schizoaffective disorder, bipolar type ([place]) 09/22/2020   Testicular hypofunction  09/22/2020   Chronic kidney disease due to hypertension 09/22/2020   Chronic renal failure syndrome 09/22/2020   Barrett's esophagus 09/22/2020   Chronic GERD 09/22/2020   Psychotic disorder ([place]) 09/22/2020   Atrial flutter ([place]) 09/22/2020   Hepatitis C antibody positive in blood 09/22/2020   Hypertensive heart and chronic kidney disease with heart failure and stage 1 through stage 4 chronic kidney disease, or chronic kidney disease ([place]) 09/20/2020   Gait abnormality 10/22/2019   Vitamin B12 deficiency 10/22/2019   Idiopathic peripheral neuropathy 10/22/2019   Paresthesia 09/08/2019    Mobitz type 2 second degree AV block 04/06/2019   Encounter for care of pacemaker 02/13/2019   Sinus node dysfunction ([place]) 02/13/2019   Encephalopathy 11/19/2018   GERD (gastroesophageal reflux disease) 11/19/2018   Other forms of angina pectoris ([place]) 06/24/2018   Essential hypertension 06/24/2018   Wide QRS ventricular tachycardia ([place]) 02/26/2017   Pacemaker: Medtronic Azure XT DR MRI [id number] PPM -  BUNDLE OF HIS pacing  02/26/2017   CKD (chronic kidney disease), stage III ([place]) 09/16/2016   Schizophrenia ([place]) 09/16/2016   Upper GI bleed 09/16/2016   Asthma 02/09/2016   Herniated lumbar intervertebral disc 04/20/2011   Hyperlipidemia with target LDL less than 130 04/20/2009   Gastroparesis 11/24/2008   History of tobacco abuse 08/14/2008   Low back pain 11/15/2007    Past Surgical History:  Procedure Laterality Date   ADENOIDECTOMY     APPENDECTOMY     COLONOSCOPY     ELECTROPHYSIOLOGY STUDY N/A 02/26/2017   Procedure: ELECTROPHYSIOLOGY STUDY;  Surgeon: Evans Lance, MD;  Location: [facility];  Service: Cardiovascular;  Laterality: N/A;   ESOPHAGOGASTRODUODENOSCOPY (EGD) WITH PROPOFOL N/A 09/18/2016   Procedure: ESOPHAGOGASTRODUODENOSCOPY (EGD) WITH PROPOFOL;  Surgeon: Ronnette Juniper, MD;  Location: [facility];  Service: Gastroenterology;  Laterality: N/A;   LUMBAR LAMINECTOMY/DECOMPRESSION MICRODISCECTOMY  04/19/2011   Procedure: LUMBAR LAMINECTOMY/DECOMPRESSION MICRODISCECTOMY;  Surgeon: Tobi Bastos;  Location: [facility];  Service: Orthopedics;  Laterality: Left;  Hemi LAminectomy/Microdiscectomy Lumbar four  - Lumbar five  on the Left (X-Ray)   PACEMAKER IMPLANT N/A 02/26/2017   Procedure: PACEMAKER IMPLANT;  Surgeon: Evans Lance, MD;  Location: [place] [facility];  Service: Cardiovascular;  Laterality: N/A;   TONSILLECTOMY     UPPER GASTROINTESTINAL ENDOSCOPY         Family History  Problem Relation Age of Onset   High blood pressure Mother     Alzheimer's disease Father    Colon cancer Neg Hx    Esophageal cancer Neg Hx    Rectal cancer Neg Hx    Stomach cancer Neg Hx     Social History   Tobacco Use   Smoking status: Former    Packs/day: 1.00    Years: 30.00    Pack years: 30.00    Types: Cigarettes    Quit date: 05/08/2008    Years since quitting: 12.4   Smokeless tobacco: Never  Vaping Use   Vaping Use: Never used  Substance Use Topics   Alcohol use: No   Drug use: No    Home Medications Prior to Admission medications   Medication Sig Start Date End Date Taking? Authorizing Provider  acetaminophen (TYLENOL) 325 MG tablet Take 650 mg by mouth every 6 (six) hours as needed for mild pain or headache.   Yes [provider]  albuterol (VENTOLIN HFA) 108 (90 Base) MCG/ACT inhaler Inhale 2 puffs into the lungs every 6 (six) hours as needed for  wheezing or shortness of breath. 09/08/20  Yes Ambs, Kathrine Cords, FNP  Budeson-Glycopyrrol-Formoterol (BREZTRI AEROSPHERE) 160-9-4.8 MCG/ACT AERO 2 puffs twice a day with a spacer Patient taking differently: Inhale 2 puffs into the lungs 2 (two) times daily. with a spacer 09/08/20  Yes Ambs, Kathrine Cords, FNP  buPROPion [facility] SR) 150 MG 12 hr tablet Take 150 mg by mouth 2 (two) times daily.   Yes [provider]  cloNIDine (CATAPRES) 0.2 MG tablet Take 1 tablet (0.2 mg total) by mouth 2 (two) times daily. Please keep upcoming appt with Dr. Lovena Le in July 2022 before anymore refills. Thank you 10/18/20  Yes Evans Lance, MD  Cyanocobalamin (VITAMIN B-12 PO) Take 1 tablet by mouth daily.   Yes [provider]  ezetimibe (ZETIA) 10 MG tablet Take 1 tablet (10 mg total) by mouth daily. 05/21/20 10/19/20 Yes O'Neal, Cassie Freer, MD  Fenofibrate 50 MG CAPS Take 1 capsule (50 mg total) by mouth daily. 02/17/20 02/16/21 Yes King, Diona Foley, NP  fluticasone (FLONASE) 50 MCG/ACT nasal spray Place 1 spray into both nostrils daily as needed for allergies. 09/08/20  Yes Ambs, Kathrine Cords, FNP  furosemide (LASIX) 20 MG tablet Take 1 tablet (20 mg total) by mouth daily. 07/15/20  Yes O'Neal, Cassie Freer, MD  gabapentin (NEURONTIN) 100 MG capsule Take 1 capsule (100 mg total) by mouth 3 (three) times daily. 09/08/19  Yes Marcial Pacas, MD  hydrALAZINE (APRESOLINE) 50 MG tablet Take 1 tablet (50 mg total) by mouth 3 (three) times daily. 04/15/20  Yes O'Neal, Cassie Freer, MD  isosorbide dinitrate (ISORDIL) 30 MG tablet TAKE 1 TABLET(30 MG) BY MOUTH FOUR TIMES DAILY Patient taking differently: Take 30 mg by mouth 4 (four) times daily. 04/15/20  Yes O'Neal, Cassie Freer, MD  metoprolol tartrate (LOPRESSOR) 100 MG tablet Take 1 tablet (100 mg total) by mouth 2 (two) times daily. 04/15/20  Yes O'Neal, Cassie Freer, MD  montelukast (SINGULAIR) 10 MG tablet Take 1 tablet (10 mg total) by mouth at bedtime. 09/08/20 12/07/20 Yes Ambs, Kathrine Cords, FNP  paliperidone (INVEGA) 6 MG 24 hr tablet Take 6 mg by mouth at bedtime.   Yes [provider]  Pancrelipase, Lip-Prot-Amyl, (ZENPEP) 40000-126000 units CPEP Take 1 tablet by mouth with breakfast, with lunch, and with evening meal. 04/12/20  Yes Thornton Park, MD  pantoprazole (PROTONIX) 40 MG tablet Take 1 tablet (40 mg total) by mouth 2 (two) times daily. 04/12/20  Yes Thornton Park, MD  sucralfate (CARAFATE) 1 GM/10ML suspension Take 10 mLs (1 g total) by mouth 4 (four) times daily -  with meals and at bedtime. Patient taking differently: Take 1 g by mouth 3 (three) times daily as needed. 04/12/20  Yes Thornton Park, MD  benazepril (LOTENSIN) 10 MG tablet Take 1 tablet (10 mg total) by mouth daily. Patient not taking: Reported on 10/19/2020 04/15/19   Adrian Prows, MD  ondansetron (ZOFRAN ODT) 4 MG disintegrating tablet Take 1 tablet (4 mg total) by mouth every 8 (eight) hours as needed for nausea or vomiting. Patient not taking: No sig reported 07/11/20   Kinnie Feil, PA-C  tamsulosin (FLOMAX) 0.4 MG CAPS capsule Take 1 capsule (0.4 mg  total) by mouth daily. Patient not taking: No sig reported 02/12/20   Geralynn Rile, MD    Allergies    Methylpyrrolidone, Niacin, Ace inhibitors, Norvasc [amlodipine besylate], Other, Oxybutynin chloride [oxybutynin chloride er], Vesicare [solifenacin succinate], Ciprofloxacin, Oxybutynin, and Solifenacin  Review of Systems  Review of Systems  Constitutional:  Negative for chills and fever.  HENT:  Negative for ear pain and sore throat.   Eyes:  Negative for pain and visual disturbance.  Respiratory:  Negative for cough and shortness of breath.   Cardiovascular:  Negative for chest pain and palpitations.  Gastrointestinal:  Positive for abdominal pain, constipation, hematemesis, nausea and vomiting.  Genitourinary:  Negative for dysuria and hematuria.  Musculoskeletal:  Negative for arthralgias and back pain.  Skin:  Negative for color change and rash.  Neurological:  Negative for seizures and syncope.  All other systems reviewed and are negative.  Physical Exam Updated Vital Signs BP 132/88 (BP Location: Left Arm)   Pulse 61   Temp 98.8 F (37.1 C) (Oral)   Resp 18   Ht 6\' 1"  (1.854 m)   Wt 99.8 kg   SpO2 95%   BMI 29.03 kg/m   Physical Exam Vitals and nursing note reviewed.  Constitutional:      Appearance: Normal appearance.  HENT:     Head: Normocephalic and atraumatic.  Eyes:     Conjunctiva/sclera: Conjunctivae normal.  Pulmonary:     Effort: Pulmonary effort is normal. No respiratory distress.  Abdominal:     General: There is distension.     Tenderness: There is abdominal tenderness.     Comments: Diffuse tenderness  Musculoskeletal:        General: No deformity. Normal range of motion.     Cervical back: Normal range of motion.  Skin:    General: Skin is warm and dry.  Neurological:     General: No focal deficit present.     Mental Status: He is alert and oriented to person, place, and time. Mental status is at baseline.  Psychiatric:         Mood and Affect: Mood normal.    ED Results / Procedures / Treatments   Labs (all labs ordered are listed, but only abnormal results are displayed) Labs Reviewed  COMPREHENSIVE METABOLIC PANEL - Abnormal; Notable for the following components:      Result Value   BUN 28 (*)    Creatinine, Ser 2.50 (*)    Calcium 8.8 (*)    Total Protein 6.2 (*)    GFR, Estimated 29 (*)    All other components within normal limits  RAPID URINE DRUG SCREEN, HOSP PERFORMED - Abnormal; Notable for the following components:   Tetrahydrocannabinol POSITIVE (*)    All other components within normal limits  ETHANOL  CBC WITH DIFFERENTIAL/PLATELET  CBC WITH DIFFERENTIAL/PLATELET  COMPREHENSIVE METABOLIC PANEL  LIPASE, BLOOD    EKG EKG Interpretation  Date/Time:  Tuesday October 19 2020 08:18:30 EDT Ventricular Rate:  69 PR Interval:  213 QRS Duration: 170 QT Interval:  460 QTC Calculation: 493 R Axis:   30 Text Interpretation: Sinus rhythm Borderline prolonged PR interval Left bundle branch block Confirmed by Sherwood Gambler [id number]) on 10/19/2020 9:06:13 AM  Radiology DG Abd Acute W/Chest  Result Date: 10/21/2020 CLINICAL DATA:  Abdominal pain EXAM: DG ABDOMEN ACUTE WITH 1 VIEW CHEST COMPARISON:  Chest x-ray 11/19/2018 FINDINGS: Heart size and vascularity normal. Lungs clear without infiltrate or effusion. Dual lead pacemaker unchanged. Mildly distended large and small bowel loops present diffusely. There is gas and stool in the rectum. No free air. IMPRESSION: No acute cardiopulmonary abnormality Mildly distended large and small bowel loops consistent with ileus. Electronically Signed   By: Franchot Gallo M.D.   On: 10/21/2020 14:41  Procedures Procedures   Medications Ordered in ED Medications  acetaminophen (TYLENOL) tablet 650 mg (650 mg Oral Given 10/20/20 0605)  albuterol (VENTOLIN HFA) 108 (90 Base) MCG/ACT inhaler 2 puff (2 puffs Inhalation Given 10/21/20 0443)  benazepril (LOTENSIN) tablet  10 mg (10 mg Oral Given 10/21/20 1013)  buPROPion (WELLBUTRIN SR) 12 hr tablet 150 mg (150 mg Oral Given 10/21/20 0948)  cloNIDine (CATAPRES) tablet 0.2 mg (0.2 mg Oral Given 10/21/20 0948)  vitamin B-12 (CYANOCOBALAMIN) tablet 100 mcg (100 mcg Oral Given 10/21/20 1017)  fenofibrate tablet 54 mg (54 mg Oral Given 10/21/20 1012)  fluticasone (FLONASE) 50 MCG/ACT nasal spray 1 spray (has no administration in time range)  furosemide (LASIX) tablet 20 mg (20 mg Oral Given 10/21/20 0948)  gabapentin (NEURONTIN) capsule 100 mg (100 mg Oral Not Given 10/21/20 1511)  hydrALAZINE (APRESOLINE) tablet 50 mg (50 mg Oral Not Given 10/21/20 1511)  isosorbide dinitrate (ISORDIL) tablet 30 mg (30 mg Oral Given 10/21/20 1313)  metoprolol tartrate (LOPRESSOR) tablet 100 mg (100 mg Oral Given 10/21/20 0948)  montelukast (SINGULAIR) tablet 10 mg (10 mg Oral Given 10/20/20 2227)  ondansetron (ZOFRAN-ODT) disintegrating tablet 4 mg (has no administration in time range)  paliperidone (INVEGA) 24 hr tablet 6 mg (6 mg Oral Given 10/20/20 2225)  pantoprazole (PROTONIX) EC tablet 40 mg (40 mg Oral Given 10/21/20 0948)  sucralfate (CARAFATE) 1 GM/10ML suspension 1 g (1 g Oral Given 10/21/20 1245)  tamsulosin (FLOMAX) capsule 0.4 mg (0.4 mg Oral Not Given 10/21/20 1033)  lipase/protease/amylase (CREON) capsule 36,000 Units (36,000 Units Oral Given 10/21/20 1245)  mometasone-formoterol (DULERA) 100-5 MCG/ACT inhaler 2 puff (2 puffs Inhalation Not Given 10/21/20 1144)    And  umeclidinium bromide (INCRUSE ELLIPTA) 62.5 MCG/INH 1 puff (1 puff Inhalation Given 10/21/20 0951)  docusate sodium (COLACE) capsule 100 mg (100 mg Oral Given 10/21/20 1244)  ondansetron (ZOFRAN) injection 4 mg (has no administration in time range)    ED Course  I have reviewed the triage vital signs and the nursing notes.  Pertinent labs & imaging results that were available during my care of the patient were reviewed by me and considered in my medical decision  making (see chart for details).    MDM Rules/Calculators/A&P                          Peter Marsh presents to the ED complaining of abdominal pain, nausea, vomiting, and one episode of hematemesis.  He was admitted to behavioral health for suicidal ideation and was cleared this afternoon.  Today he has had ongoing abdominal distention in addition to his new nausea and vomiting.  He was brought to the ED for further evaluation of etiologies such as small bowel obstruction, ileus, or other intra-abdominal process. Final Clinical Impression(s) / ED Diagnoses Final diagnoses:  COVID-19 virus infection  Suicidal thoughts  Generalized abdominal pain    Rx / DC Orders ED Discharge Orders     None        Arnaldo Natal, MD 10/21/20 [phone]

## 2020-10-21 NOTE — ED Notes (Signed)
Pt vomiting, some blood noted in emesis. feeling very sick. Notified EDP in green zone. Pt will move to room 6. Notified charge Therapist, sports as well.

## 2020-10-21 NOTE — ED Provider Notes (Signed)
Patient signed out to me by previous provider.  Please refer to their note for full HPI.  Briefly this is a 60 year old male who is currently being evaluated for abdominal distention, nausea/vomiting.  Side note is that the patient was originally here, medically cleared to the psychiatric unit for SI.  He has been psychiatrically cleared, IVC reversed.  This is when he developed abdominal distention and nausea/vomiting.  There was also reported episode of hematemesis.  Vitals are stable, blood work shows baseline.  We are pending CT of the abdomen pelvis at time of signout. Physical Exam  BP (!) 140/92   Pulse 66   Temp 98.8 F (37.1 C) (Oral)   Resp (!) 21   Ht 6\' 1"  (1.854 m)   Wt 99.8 kg   SpO2 100%   BMI 29.03 kg/m   Physical Exam Vitals and nursing note reviewed.  Constitutional:      Appearance: Normal appearance.  HENT:     Head: Normocephalic.     Mouth/Throat:     Mouth: Mucous membranes are moist.  Cardiovascular:     Rate and Rhythm: Normal rate.  Pulmonary:     Effort: Pulmonary effort is normal. No respiratory distress.  Abdominal:     General: There is distension.     Palpations: Abdomen is soft.     Tenderness: There is no abdominal tenderness. There is no guarding.  Skin:    General: Skin is warm.  Neurological:     Mental Status: He is alert and oriented to person, place, and time. Mental status is at baseline.  Psychiatric:        Mood and Affect: Mood normal.    ED Course/Procedures     Procedures  MDM   CAT scan shows findings of enteritis and mild ileus, early obstruction cannot be excluded.  On reevaluation patient continues to complain of nausea.  After medication he feels better, no further nausea/vomiting however not been able to p.o. the patient.  Of note he is also COVID-positive.  We will plan for admission, serial abdominal examinations, n.p.o. and reevaluation.  Patients evaluation and results requires admission for further treatment and care.  Patient agrees with admission plan, offers no new complaints and is stable/unchanged at time of admit.       Lorelle Gibbs, DO 10/21/20 2039

## 2020-10-21 NOTE — Evaluation (Signed)
Physical Therapy Evaluation Patient Details Name: Peter Marsh MRN: [id number] DOB: 1960/06/23 Today's Date: 10/21/2020   History of Present Illness  Pt is a 60 y/o male presenting secondary to suicidal ideation. Found to be COVID +. PMH includes asthma, CKD, COPD, HTN, and schizophrenia.  Clinical Impression  Pt presenting secondary to problem above with deficits below. Pt limited this session secondary to fatigue. Required min guard A for mobility tasks within the room. No overt LOB noted. Recommending use of rollator to help with energy conservation when discharged. Per notes, plan is to go to inpatient psych. Will continue to follow acutely.     Follow Up Recommendations Other (comment) (inpatient psych)    Equipment Recommendations  Other (comment) (rollator (4wheeled walker with seat))    Recommendations for Other Services       Precautions / Restrictions Precautions Precautions: Fall Precaution Comments: Reports some falls at home Restrictions Weight Bearing Restrictions: No      Mobility  Bed Mobility Overal bed mobility: Modified Independent                  Transfers Overall transfer level: Needs assistance Equipment used: None Transfers: Sit to/from Stand Sit to Stand: Min guard         General transfer comment: Min guard for safety. No physical assist required  Ambulation/Gait Ambulation/Gait assistance: Min guard Gait Distance (Feet): 15 Feet Assistive device: 1 person hand held assist Gait Pattern/deviations: Step-through pattern;Decreased stride length Gait velocity: Decreased   General Gait Details: Min guard A for safety during ambulation within the room. Distance limited secondary to fatigue. No overt LOB noted. Cues for pursed lip breathing  Stairs            Wheelchair Mobility    Modified Rankin (Stroke Patients Only)       Balance Overall balance assessment: Mild deficits observed, not formally tested                                            Pertinent Vitals/Pain Pain Assessment: No/denies pain    Home Living Family/patient expects to be discharged to:: Other (Comment) (Inpatient psych)                      Prior Function Level of Independence: Independent               Hand Dominance        Extremity/Trunk Assessment   Upper Extremity Assessment Upper Extremity Assessment: Overall WFL for tasks assessed    Lower Extremity Assessment Lower Extremity Assessment: Generalized weakness (reports numbness in bilateral feet at baseline)    Cervical / Trunk Assessment Cervical / Trunk Assessment: Kyphotic  Communication   Communication: No difficulties  Cognition Arousal/Alertness: Awake/alert Behavior During Therapy: Flat affect Overall Cognitive Status: No family/caregiver present to determine baseline cognitive functioning                                 General Comments: Flat affect throughout.      General Comments General comments (skin integrity, edema, etc.): Educated about energy conservation and use of rollator to increase safety.    Exercises     Assessment/Plan    PT Assessment Patient needs continued PT services  PT Problem List Decreased strength;Decreased balance;Decreased mobility;Decreased activity tolerance;Cardiopulmonary  status limiting activity       PT Treatment Interventions DME instruction;Gait training;Stair training;Functional mobility training;Therapeutic activities;Therapeutic exercise;Balance training;Patient/family education    PT Goals (Current goals can be found in the Care Plan section)  Acute Rehab PT Goals Patient Stated Goal: to feel better PT Goal Formulation: With patient Time For Goal Achievement: 11/04/20 Potential to Achieve Goals: Fair    Frequency Min 3X/week   Barriers to discharge        Co-evaluation               AM-PAC PT "6 Clicks" Mobility  Outcome Measure Help  needed turning from your back to your side while in a flat bed without using bedrails?: None Help needed moving from lying on your back to sitting on the side of a flat bed without using bedrails?: None Help needed moving to and from a bed to a chair (including a wheelchair)?: A Little Help needed standing up from a chair using your arms (e.g., wheelchair or bedside chair)?: A Little Help needed to walk in hospital room?: A Little Help needed climbing 3-5 steps with a railing? : A Little 6 Click Score: 20    End of Session   Activity Tolerance: Patient limited by fatigue Patient left: in bed;with call bell/phone within reach;with nursing/sitter in room (in bed in ED) Nurse Communication: Mobility status PT Visit Diagnosis: Other abnormalities of gait and mobility (R26.89)    Time: 6629-4765 PT Time Calculation (min) (ACUTE ONLY): 15 min   Charges:   PT Evaluation $PT Eval Moderate Complexity: 1 Mod          Lou Miner, DPT  Acute Rehabilitation Services  Pager: [phone] Office: [phone]   Rudean Hitt 10/21/2020, 11:49 AM

## 2020-10-21 NOTE — ED Provider Notes (Addendum)
Emergency Medicine Observation Re-evaluation Note  Peter Marsh is a 60 y.o. male, seen on rounds today.  Pt initially presented to the ED for complaints of Medical Clearance Currently, the patient is lying in bed. He states he is having a hard time getting up this morning.  Physical Exam  BP 95/73 (BP Location: Left Arm)   Pulse 61   Temp (!) 97.5 F (36.4 C) (Oral)   Resp 18   Ht 6\' 1"  (1.854 m)   Wt 99.8 kg   SpO2 95%   BMI 29.03 kg/m  Physical Exam General: nad Lungs: no distress Psych: flat affect, cooperative  ED Course / MDM  EKG:EKG Interpretation  Date/Time:  Tuesday October 19 2020 08:18:30 EDT Ventricular Rate:  69 PR Interval:  213 QRS Duration: 170 QT Interval:  460 QTC Calculation: 493 R Axis:   30 Text Interpretation: Sinus rhythm Borderline prolonged PR interval Left bundle branch block Confirmed by Sherwood Gambler [phone]) on 10/19/2020 9:06:13 AM  I have reviewed the labs performed to date as well as medications administered while in observation.  Recent changes in the last 24 hours include none.  Plan  Current plan is for inpatient placement which is being delayed due to positive COVID test. PT order placed to evaluate his complaint of weakness. Patient is under full IVC at this time.   Arnaldo Natal, MD 10/21/20 [phone]  14:21: Patient is cleared psychiatrically and can be discharged home.    Arnaldo Natal, MD 10/21/20 [phone]

## 2020-10-21 NOTE — Progress Notes (Signed)
Received male pt from ER via stretcher, not in distress on room air, alert oriented, with IV cannula on the right arm, with NGT on the right nares, clamped, voiding freely thru urinal pt managed to transfer to room 35 bed with minimal assist, activities well tolerated, connected to cardiac monitor, informed tele dept about admission, CHG bath given, oriented pt room and used of call bell, advised to call first the nurse or the NT before going out of the bed, pt agreed

## 2020-10-21 NOTE — ED Notes (Signed)
Peter Marsh, wife, [phone] call and update given

## 2020-10-21 NOTE — BH Assessment (Addendum)
TTS Re-Assessment per Ava.  Requested nursing Claiborne Billings, RN) to place TTS machine in patient's room for re-assessment @ 0820.   Per nursing @0920 , unable to put machine in patient's at this time. Clinician asked nursing to contact TTS when machine is able to be placed in patient's room for  re-assessment).

## 2020-10-21 NOTE — Progress Notes (Signed)
Informed DR Cyd Silence, xray was done to pt for location of NGT, thru secured message, DR Cyd Silence ordered to connect NGT to low intermittent suction, to keep on NPO except for ice chips

## 2020-10-21 NOTE — ED Notes (Signed)
At bedside with provider at this time.

## 2020-10-21 NOTE — ED Notes (Signed)
Pt vomiting and c/o nausea with abd distention

## 2020-10-22 ENCOUNTER — Observation Stay (HOSPITAL_COMMUNITY): Payer: PPO

## 2020-10-22 DIAGNOSIS — R609 Edema, unspecified: Secondary | ICD-10-CM | POA: Diagnosis not present

## 2020-10-22 DIAGNOSIS — K219 Gastro-esophageal reflux disease without esophagitis: Secondary | ICD-10-CM | POA: Diagnosis present

## 2020-10-22 DIAGNOSIS — Z820 Family history of epilepsy and other diseases of the nervous system: Secondary | ICD-10-CM | POA: Diagnosis not present

## 2020-10-22 DIAGNOSIS — N184 Chronic kidney disease, stage 4 (severe): Secondary | ICD-10-CM | POA: Diagnosis not present

## 2020-10-22 DIAGNOSIS — K567 Ileus, unspecified: Secondary | ICD-10-CM | POA: Diagnosis present

## 2020-10-22 DIAGNOSIS — I714 Abdominal aortic aneurysm, without rupture: Secondary | ICD-10-CM | POA: Diagnosis not present

## 2020-10-22 DIAGNOSIS — I480 Paroxysmal atrial fibrillation: Secondary | ICD-10-CM | POA: Diagnosis present

## 2020-10-22 DIAGNOSIS — K529 Noninfective gastroenteritis and colitis, unspecified: Secondary | ICD-10-CM | POA: Diagnosis present

## 2020-10-22 DIAGNOSIS — E785 Hyperlipidemia, unspecified: Secondary | ICD-10-CM | POA: Diagnosis present

## 2020-10-22 DIAGNOSIS — K56609 Unspecified intestinal obstruction, unspecified as to partial versus complete obstruction: Secondary | ICD-10-CM | POA: Diagnosis present

## 2020-10-22 DIAGNOSIS — N179 Acute kidney failure, unspecified: Secondary | ICD-10-CM | POA: Diagnosis present

## 2020-10-22 DIAGNOSIS — E782 Mixed hyperlipidemia: Secondary | ICD-10-CM | POA: Diagnosis present

## 2020-10-22 DIAGNOSIS — Z950 Presence of cardiac pacemaker: Secondary | ICD-10-CM | POA: Diagnosis not present

## 2020-10-22 DIAGNOSIS — J449 Chronic obstructive pulmonary disease, unspecified: Secondary | ICD-10-CM | POA: Diagnosis not present

## 2020-10-22 DIAGNOSIS — R7989 Other specified abnormal findings of blood chemistry: Secondary | ICD-10-CM | POA: Diagnosis not present

## 2020-10-22 DIAGNOSIS — N132 Hydronephrosis with renal and ureteral calculous obstruction: Secondary | ICD-10-CM | POA: Diagnosis present

## 2020-10-22 DIAGNOSIS — Z6828 Body mass index (BMI) 28.0-28.9, adult: Secondary | ICD-10-CM | POA: Diagnosis not present

## 2020-10-22 DIAGNOSIS — N200 Calculus of kidney: Secondary | ICD-10-CM | POA: Diagnosis not present

## 2020-10-22 DIAGNOSIS — E669 Obesity, unspecified: Secondary | ICD-10-CM | POA: Diagnosis not present

## 2020-10-22 DIAGNOSIS — U071 COVID-19: Secondary | ICD-10-CM | POA: Diagnosis not present

## 2020-10-22 DIAGNOSIS — K920 Hematemesis: Secondary | ICD-10-CM | POA: Diagnosis not present

## 2020-10-22 DIAGNOSIS — I129 Hypertensive chronic kidney disease with stage 1 through stage 4 chronic kidney disease, or unspecified chronic kidney disease: Secondary | ICD-10-CM | POA: Diagnosis not present

## 2020-10-22 DIAGNOSIS — I4892 Unspecified atrial flutter: Secondary | ICD-10-CM | POA: Diagnosis not present

## 2020-10-22 DIAGNOSIS — F250 Schizoaffective disorder, bipolar type: Secondary | ICD-10-CM | POA: Diagnosis not present

## 2020-10-22 DIAGNOSIS — Z87891 Personal history of nicotine dependence: Secondary | ICD-10-CM | POA: Diagnosis not present

## 2020-10-22 DIAGNOSIS — Z888 Allergy status to other drugs, medicaments and biological substances status: Secondary | ICD-10-CM | POA: Diagnosis not present

## 2020-10-22 LAB — CBC WITH DIFFERENTIAL/PLATELET
Abs Immature Granulocytes: 0.04 10*3/uL (ref 0.00–0.07)
Basophils Absolute: 0 10*3/uL (ref 0.0–0.1)
Basophils Relative: 0 %
Eosinophils Absolute: 0.1 10*3/uL (ref 0.0–0.5)
Eosinophils Relative: 1 %
HCT: 47.4 % (ref 39.0–52.0)
Hemoglobin: 15.8 g/dL (ref 13.0–17.0)
Immature Granulocytes: 0 %
Lymphocytes Relative: 11 %
Lymphs Abs: 1.2 10*3/uL (ref 0.7–4.0)
MCH: 28.6 pg (ref 26.0–34.0)
MCHC: 33.3 g/dL (ref 30.0–36.0)
MCV: 85.7 fL (ref 80.0–100.0)
Monocytes Absolute: 0.9 10*3/uL (ref 0.1–1.0)
Monocytes Relative: 8 %
Neutro Abs: 8.4 10*3/uL — ABNORMAL HIGH (ref 1.7–7.7)
Neutrophils Relative %: 80 %
Platelets: 158 10*3/uL (ref 150–400)
RBC: 5.53 MIL/uL (ref 4.22–5.81)
RDW: 12.6 % (ref 11.5–15.5)
WBC: 10.6 10*3/uL — ABNORMAL HIGH (ref 4.0–10.5)
nRBC: 0 % (ref 0.0–0.2)

## 2020-10-22 LAB — COMPREHENSIVE METABOLIC PANEL
ALT: 16 U/L (ref 0–44)
AST: 23 U/L (ref 15–41)
Albumin: 4 g/dL (ref 3.5–5.0)
Alkaline Phosphatase: 58 U/L (ref 38–126)
Anion gap: 13 (ref 5–15)
BUN: 40 mg/dL — ABNORMAL HIGH (ref 8–23)
CO2: 25 mmol/L (ref 22–32)
Calcium: 9.8 mg/dL (ref 8.9–10.3)
Chloride: 101 mmol/L (ref 98–111)
Creatinine, Ser: 3.09 mg/dL — ABNORMAL HIGH (ref 0.61–1.24)
GFR, Estimated: 22 mL/min — ABNORMAL LOW (ref >60)
Glucose, Bld: 105 mg/dL — ABNORMAL HIGH (ref 70–99)
Potassium: 4.1 mmol/L (ref 3.5–5.1)
Sodium: 139 mmol/L (ref 135–145)
Total Bilirubin: 1.2 mg/dL (ref 0.3–1.2)
Total Protein: 7.3 g/dL (ref 6.5–8.1)

## 2020-10-22 LAB — D-DIMER, QUANTITATIVE: D-Dimer, Quant: 4.8 ug/mL-FEU — ABNORMAL HIGH (ref 0.00–0.50)

## 2020-10-22 LAB — MAGNESIUM: Magnesium: 1.7 mg/dL (ref 1.7–2.4)

## 2020-10-22 LAB — PROCALCITONIN: Procalcitonin: <0.1 ng/mL

## 2020-10-22 LAB — HIV ANTIBODY (ROUTINE TESTING W REFLEX): HIV Screen 4th Generation wRfx: NONREACTIVE

## 2020-10-22 LAB — C-REACTIVE PROTEIN: CRP: 1.5 mg/dL — ABNORMAL HIGH (ref <1.0)

## 2020-10-22 MED ORDER — MAGNESIUM SULFATE 2 GM/50ML IV SOLN
2.0000 g | Freq: Once | INTRAVENOUS | Status: AC
  Administered 2020-10-22: 2 g via INTRAVENOUS
  Filled 2020-10-22: qty 50

## 2020-10-22 MED ORDER — DIATRIZOATE MEGLUMINE & SODIUM 66-10 % PO SOLN
90.0000 mL | Freq: Once | ORAL | Status: AC
  Administered 2020-10-22: 90 mL via NASOGASTRIC
  Filled 2020-10-22: qty 90

## 2020-10-22 NOTE — Progress Notes (Signed)
PROGRESS NOTE                                                                                                                                                                                                             Patient Demographics:    Peter Marsh, is a 60 y.o. male, DOB - Jan 05, 1961, VZD:[id number]  Outpatient Primary MD for the patient is Vevelyn Francois, NP   Admit date - 10/19/2020   LOS - 0  Chief Complaint  Patient presents with   Medical Clearance       Brief Narrative: Patient is a 60 y.o. male with PMHx of schizophrenia, CKD stage IV, HTN, HLD, PAF (not on anticoagulation due to GI bleeding), s/p permanent pacemaker implantation-who was admitted for evaluation of SBO.  He was evaluated on 6/13 at [facility] for suicidal ideation-transferred to [facility] ED on 6/14 as he was COVID-positive.  While at [facility] ED-he developed SBO.  See below for further details.   COVID-19 vaccinated status: Vaccinated-boosted x1  Significant Events: 6/13>> hearing voices-suicidal ideation-evaluated at [facility] 6/14>> transfer to [facility] ED for COVID-positive status 6/16>> while at [facility] ED-cleared by psych for discharge. 6/16>> admit to [facility] for SBO/ileus  Significant studies: 6/14>>Chest x-ray: No pneumonia 6/16>> x-ray abdomen: Distended large and small bowel loops. 6/16>> CT abdomen/pelvis: Enteritis/mild ileus-early obstruction not excluded. 6/17>> x-ray abdomen: NG tube in place-ongoing SBO pattern.  COVID-19 medications: None  Antibiotics: None  Microbiology data: None  Procedures: None  Consults: General surgery  DVT prophylaxis: enoxaparin (LOVENOX) injection 30 mg Start: 10/21/20 2300    Subjective:    Kendra Opitz today is requesting that the NG tube be removed-claims he vomited earlier this morning while the NG tube was in place.  He has not passed flatus since yesterday afternoon-and not had a BM for close to 2  days.  He does not have any abdominal pain though.   Assessment  & Plan :   SBO/ileus: Vomited this a.m. with NG tube in place-continue n.p.o. status-NG tube (counseled regarding importance of keeping it in)-antiemetics and other supportive care.  General surgery consulted this morning.  AKI on CKD stage IV: AKI hemodynamically mediated-improving with IV fluids.  Appears to have a small distal right ureteral stone (denies any renal colic like symptoms) that probably should pass spontaneously given its size-will continue to observe  closely.  1 episode of hematemesis on day of admission: None so far-hemoglobin stable-monitor on PPI-could have had a small Mallory-Weiss tear.  If hematemesis recurs-we will consult GI.  COVID-19 infection: Diagnosed on 6/13-minimal cough but really no other symptoms-no hypoxemia.  Suspect that reasonable to observe without initiating any treatment at this point.  HTN: BP currently controlled-on transdermal clonidine, and IV hydralazine and IV Lopressor-given the fact that he is NPO.  Resume oral antihypertensives when able.  History of atrial flutter: Reviewed outpatient cardiology note-no anticoagulation due to GI bleed  History of sinus node dysfunction-s/p PPM in place  GERD: Continue PPI  Bipolar disorder/schizoaffective-suicidal ideation: Cleared by psychiatry on 6/16-resume psychotropic medications when oral intake resumes.  Several nonobstructing bilateral renal calculi-5 mm distal right ureteral/right UVJ calculus: Likely to pass spontaneously-per CT-hydronephrosis is similar to prior CT scans.   3.2 cm infrarenal aortic aneurysm: Incidental finding-radiology recommends ultrasound every 3 years.  Obesity: Estimated body mass index is 28.3 kg/m as calculated from the following:   Height as of this encounter: 6\' 1"  (1.854 m).   Weight as of this encounter: 97.3 kg.    ABG:    Component Value Date/Time   TCO2 24 11/19/2018 2112    Vent  Settings: N/A    Condition - Stable  Family Communication  :  None at bedside-will reach out to family over the next few days  Code Status :  Full Code  Diet :  Diet Order             Diet NPO time specified Except for: Ice Chips  Diet effective now                    Disposition Plan  :   Status is: Observation  The patient will require care spanning > 2 midnights and should be moved to inpatient because: Inpatient level of care appropriate due to severity of illness  Dispo: The patient is from: Home              Anticipated d/c is to: Home              Patient currently is not medically stable to d/c.   Difficult to place patient No     Barriers to discharge: Persistent ileus/SBO-renal function also improving-noted at baseline.  Remains NPO-NGT in place  Antimicorbials  :    Anti-infectives (From admission, onward)    None       Inpatient Medications  Scheduled Meds:  cloNIDine  0.2 mg Transdermal Q Thu   enoxaparin (LOVENOX) injection  30 mg Subcutaneous Q24H   hydrALAZINE  10 mg Intravenous Q8H   isosorbide dinitrate  30 mg Oral QID   metoprolol tartrate  5 mg Intravenous Q6H   mometasone-formoterol  2 puff Inhalation BID   And   umeclidinium bromide  1 puff Inhalation Daily   pantoprazole (PROTONIX) IV  40 mg Intravenous Q12H   Continuous Infusions:  lactated ringers 100 mL/hr at 10/22/20 0120   PRN Meds:.albuterol, fluticasone, guaiFENesin-dextromethorphan, ondansetron (ZOFRAN) IV   Time Spent in minutes  35   See all Orders from today for further details   Oren Binet M.D on 10/22/2020 at 12:03 PM  To page go to [email or web address] - use universal password  [facility] -  Office  [phone]    Objective:   Vitals:   10/22/20 0127 10/22/20 0400 10/22/20 0610 10/22/20 0800  BP: 126/79 134/90 117/76 (!) 141/89  Pulse: 73 83 95  100  Resp:  16  16  Temp:  98.2 F (36.8 C)  98.8 F (37.1 C)  TempSrc:  Oral  Oral  SpO2:   94%  94%  Weight:   97.3 kg   Height:        Wt Readings from Last 3 Encounters:  10/22/20 97.3 kg  10/19/20 99.8 kg  09/20/20 103 kg     Intake/Output Summary (Last 24 hours) at 10/22/2020 1203 Last data filed at 10/22/2020 1131 Gross per 24 hour  Intake 646.1 ml  Output 350 ml  Net 296.1 ml     Physical Exam Gen Exam:Alert awake-not in any distress HEENT:atraumatic, normocephalic Chest: B/L clear to auscultation anteriorly CVS:S1S2 regular Abdomen:soft -very sluggish BS-soft-nontender. Extremities:no edema Neurology: Non focal Skin: no rash   Data Review:    CBC Recent Labs  Lab 10/19/20 0825 10/21/20 1536 10/22/20 0149  WBC 4.7 9.5 10.6*  HGB 13.6 15.6 15.8  HCT 42.5 48.6 47.4  PLT 165 187 158  MCV 88.4 88.4 85.7  MCH 28.3 28.4 28.6  MCHC 32.0 32.1 33.3  RDW 12.7 12.8 12.6  LYMPHSABS 1.6 1.1 1.2  MONOABS 0.6 0.7 0.9  EOSABS 0.2 0.1 0.1  BASOSABS 0.0 0.0 0.0    Chemistries  Recent Labs  Lab 10/19/20 0825 10/21/20 1536 10/22/20 0149  NA 136 137 139  K 4.4 4.6 4.1  CL 107 100 101  CO2 23 27 25   GLUCOSE 95 105* 105*  BUN 28* 41* 40*  CREATININE 2.50* 3.25* 3.09*  CALCIUM 8.8* 9.8 9.8  MG  --   --  1.7  AST 21 23 23   ALT 16 16 16   ALKPHOS 59 63 58  BILITOT 0.5 0.9 1.2   ------------------------------------------------------------------------------------------------------------------ No results for input(s): CHOL, HDL, LDLCALC, TRIG, CHOLHDL, LDLDIRECT in the last 72 hours.  Lab Results  Component Value Date   HGBA1C 5.5 09/08/2019   ------------------------------------------------------------------------------------------------------------------ No results for input(s): TSH, T4TOTAL, T3FREE, THYROIDAB in the last 72 hours.  Invalid input(s): FREET3 ------------------------------------------------------------------------------------------------------------------ No results for input(s): VITAMINB12, FOLATE, FERRITIN, TIBC, IRON,  RETICCTPCT in the last 72 hours.  Coagulation profile No results for input(s): INR, PROTIME in the last 168 hours.  Recent Labs    10/22/20 0149  DDIMER 4.80*    Cardiac Enzymes No results for input(s): CKMB, TROPONINI, MYOGLOBIN in the last 168 hours.  Invalid input(s): CK ------------------------------------------------------------------------------------------------------------------    Component Value Date/Time   BNP 100.4 (H) 12/08/2019 9628    Micro Results Recent Results (from the past 240 hour(s))  Resp Panel by RT-PCR (Flu A&B, Covid) Nasopharyngeal Swab     Status: Abnormal   Collection Time: 10/18/20 11:43 PM   Specimen: Nasopharyngeal Swab; Nasopharyngeal(NP) swabs in vial transport medium  Result Value Ref Range Status   SARS Coronavirus 2 by RT PCR POSITIVE (A) NEGATIVE Final    Comment: RESULT CALLED TO, READ BACK BY AND VERIFIED WITH: RN AMY CLLOODSELTER BY MESSAN H. AT 0250 ON 6 14/2022 (NOTE) SARS-CoV-2 target nucleic acids are DETECTED.  The SARS-CoV-2 RNA is generally detectable in upper respiratory specimens during the acute phase of infection. Positive results are indicative of the presence of the identified virus, but do not rule out bacterial infection or co-infection with other pathogens not detected by the test. Clinical correlation with patient history and other diagnostic information is necessary to determine patient infection status. The expected result is Negative.  Fact Sheet for Patients: EntrepreneurPulse.com.au  Fact Sheet for Healthcare Providers: IncredibleEmployment.be  This test  is not yet approved or cleared by the [place] and  has been authorized for detection and/or diagnosis of SARS-CoV-2 by FDA under an Emergency Use Authorization (EUA).  This EUA will remain in effect (mean ing this test can be used) for the duration of  the COVID-19 declaration under Section 564(b)(1) of the  Act, 21 U.S.C. section 360bbb-3(b)(1), unless the authorization is terminated or revoked sooner.     Influenza A by PCR NEGATIVE NEGATIVE Final   Influenza B by PCR NEGATIVE NEGATIVE Final    Comment: (NOTE) The Xpert Xpress SARS-CoV-2/FLU/RSV plus assay is intended as an aid in the diagnosis of influenza from Nasopharyngeal swab specimens and should not be used as a sole basis for treatment. Nasal washings and aspirates are unacceptable for Xpert Xpress SARS-CoV-2/FLU/RSV testing.  Fact Sheet for Patients: EntrepreneurPulse.com.au  Fact Sheet for Healthcare Providers: IncredibleEmployment.be  This test is not yet approved or cleared by the [place] FDA and has been authorized for detection and/or diagnosis of SARS-CoV-2 by FDA under an Emergency Use Authorization (EUA). This EUA will remain in effect (meaning this test can be used) for the duration of the COVID-19 declaration under Section 564(b)(1) of the Act, 21 U.S.C. section 360bbb-3(b)(1), unless the authorization is terminated or revoked.  Performed at [facility], [place] [address] [place], [place] [address]     Radiology Reports CT Abdomen Pelvis Wo Contrast  Result Date: 10/21/2020 CLINICAL DATA:  60 year old male with abdominal pain. Concern for bowel obstruction. EXAM: CT ABDOMEN AND PELVIS WITHOUT CONTRAST TECHNIQUE: Multidetector CT imaging of the abdomen and pelvis was performed following the standard protocol without IV contrast. COMPARISON:  CT of the abdomen pelvis dated 06/17/2016. FINDINGS: Evaluation of this exam is limited in the absence of intravenous contrast. Lower chest: The visualized lung bases are clear. Cardiac pacemaker wires noted. No intra-abdominal free air or free fluid. Hepatobiliary: The liver is unremarkable. No intrahepatic biliary dilatation. Small gallstone. No pericholecystic fluid or evidence of acute cholecystitis by CT. Pancreas: Scattered  pancreatic calcification, sequela of prior pancreatitis. No active inflammatory changes. Spleen: Normal in size without focal abnormality. Adrenals/Urinary Tract: The adrenal glands are unremarkable. Mild left and severe right renal parenchyma atrophy. 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 There is moderate hydronephrosis of the upper pole moiety. Two adjacent stones noted in the upper pole collecting system each measuring approximately 9 mm and combined length of approximately 15 mm. There is severe hydronephrosis of the inferior pole moiety versus less likely a parapelvic cyst. This is similar to prior CT. A 7 mm calcification noted along the inferior aspect of dilated inferior pole moiety adjacent to the ureteropelvic junction. Several nonobstructing left renal calculi measure up to 9 mm in the inferior pole of the left kidney. No hydronephrosis on the left. A 4.7 cm hypodense lesion with fluid attenuation arising from the superior pole of the left kidney has increased in size since the prior CT. This likely represent cysts but suboptimally characterized on this noncontrast CT. Ultrasound may provide better evaluation. The visualized ureters appear unremarkable. There is a 5 mm stone in the distal right ureter adjacent to the right ureterovesical junction. Stomach/Bowel: There is moderate stool throughout the colon. Multiple mildly dilated fluid-filled loops of small bowel measure up to 4 cm in the left anterior abdomen. The terminal ileum and distal small bowel demonstrate a normal caliber. No definite transition identified. Although findings may represent enteritis and mild ileus an early  obstruction is not excluded. Clinical correlation is recommended. Appendectomy. Vascular/Lymphatic: Moderate aortoiliac atherosclerotic disease. There is a 3.2 cm infrarenal aortic aneurysm, new since the prior CT. The IVC is unremarkable. No portal venous gas. There is no adenopathy.  Reproductive: Prostate and seminal vesicles are grossly unremarkable. No pelvic mass. Other: None Musculoskeletal: Degenerative changes of the spine. No acute osseous pathology. IMPRESSION: 1. Probable enteritis with mild ileus. An early obstruction is not excluded. Clinical correlation is recommended. 2. 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 3. Several nonobstructing bilateral renal calculi and a 5 mm distal right ureteral/right UVJ calculus. No hydronephrosis on the left. 4. A 3.2 cm infrarenal aortic aneurysm, new since the prior CT.Recommend follow-up ultrasound every 3 years. This recommendation follows ACR consensus guidelines: White Paper of the ACR Incidental Findings Committee II on Vascular Findings. J Am Coll Radiol 2013; 10:789-794. 5. Cholelithiasis. 6. Aortic Atherosclerosis (ICD10-I70.0). Electronically Signed   By: Anner Crete M.D.   On: 10/21/2020 18:35   DG Abd 1 View  Result Date: 10/21/2020 CLINICAL DATA:  NG tube placement EXAM: ABDOMEN - 1 VIEW COMPARISON:  Radiograph 10/21/2020, CT 10/21/2020 FINDINGS: Transesophageal tube is coiled in the left upper quadrant with tip and side port distal to the GE junction. Telemetry leads overlie the upper abdomen. Multiple clustered loops of air distended small bowel are again seen in the upper abdomen compatible with known bowel obstruction. No suspicious calcifications. Degenerative changes in the imaged spine and pelvis. IMPRESSION: Transesophageal tube appropriately positioned within the gastric lumen. Persistent small bowel dilatation clustered in the left upper quadrant compatible with known obstruction. Electronically Signed   By: Lovena Le M.D.   On: 10/21/2020 23:33   DG Chest Portable 1 View  Result Date: 10/19/2020 CLINICAL DATA:  COVID. Additional history provided: COVID positive 6/13. Patient reports chest pain, shortness of breath, cough. EXAM: PORTABLE  CHEST 1 VIEW COMPARISON:  Prior chest radiographs 07/11/2020 and earlier. FINDINGS: Redemonstrated left chest dual lead implantable cardiac device. Mild cardiomegaly, unchanged. Aortic atherosclerosis. No appreciable airspace consolidation or pulmonary edema. No evidence of pleural effusion or pneumothorax. No acute bony abnormality identified. IMPRESSION: No evidence of acute cardiopulmonary abnormality. Mild cardiomegaly, unchanged. Aortic Atherosclerosis (ICD10-I70.0). Electronically Signed   By: Kellie Simmering DO   On: 10/19/2020 08:46   DG Abd Acute W/Chest  Result Date: 10/21/2020 CLINICAL DATA:  Abdominal pain EXAM: DG ABDOMEN ACUTE WITH 1 VIEW CHEST COMPARISON:  Chest x-ray 11/19/2018 FINDINGS: Heart size and vascularity normal. Lungs clear without infiltrate or effusion. Dual lead pacemaker unchanged. Mildly distended large and small bowel loops present diffusely. There is gas and stool in the rectum. No free air. IMPRESSION: No acute cardiopulmonary abnormality Mildly distended large and small bowel loops consistent with ileus. Electronically Signed   By: Franchot Gallo M.D.   On: 10/21/2020 14:41   DG Abd Portable 2V  Result Date: 10/22/2020 CLINICAL DATA:  60 year old male with suspected small bowel obstruction. EXAM: PORTABLE ABDOMEN - 2 VIEW COMPARISON:  10/21/2020 and earlier. FINDINGS: AP portable upright and supine views of the abdomen and pelvis. Enteric tube remains looped within the stomach. Negative lung bases. Partially visible cardiac pacer leads. No pneumoperitoneum. But continued gas-filled and dilated small bowel loops throughout the mid abdomen up to 46 mm diameter. Paucity of large bowel gas. Small volume retained stool in the rectum. Right nephrolithiasis redemonstrated. Stable visualized osseous structures. IMPRESSION: 1. Ongoing small bowel obstruction pattern, with  stable dilated mid abdominal loops up to 46 mm diameter. 2. No free air. 3. Enteric tube within the stomach.  Electronically Signed   By: Genevie Ann M.D.   On: 10/22/2020 06:11   CUP PACEART REMOTE DEVICE CHECK  Result Date: 09/28/2020 Scheduled remote reviewed. Normal device function.  RP Next remote 91 days.

## 2020-10-22 NOTE — Consult Note (Signed)
[facility] Surgery Consult Note  Peter Marsh 1960-05-20  [id number].    Requesting MD: ghimire, md Chief Complaint/Reason for Consult: ileus vs SBO  HPI:  60 year old male with a history of schizophrenia, CKD IV, HTN, HLD, paroxysmal atrial flutter (not on anticoagulation due to Hx GI bleeding), hx Medtronic dual-chamber pacemaker placement, and asthma who initially presented to [facility] emergency department 6/14, after initially been seen at [facility] 6/13 for auditory hallucinations, for management of suicidal ideation in the setting of COVID infection. He was observed under IVC 6/14-6/16 before IVC was cleared. On 6/15 he c/o intermittent, sharp, non-radiating abdominal pain that gradually worsened. Associated with nausea and emesis, including one episode of hematemesis. Last flatus yesterday. Last BM greater than 5 days ago. Denies a personal or family history of inflammatory bowel disease. Nausea and emesis have continued today with emesis x1 immediately prior to my visit. Abdominal pain has improved. He has been minimally ambulatory.  Surgical history: appendectomy in childhood Substance: former smoker - quit in 2010, denies alcohol use or IVDU Social Hx: does not work. Lives in apartment with his wife  CT of the abdomen revealed "probable enteritis with mild ileus. An early obstruction is not Excluded" and CCS was asked to see. Patient has an NG tube in place.   ROS: Review of Systems  Constitutional: Negative.   HENT: Negative.    Eyes: Negative.   Respiratory:  Positive for cough. Negative for hemoptysis, sputum production, shortness of breath and wheezing.   Cardiovascular: Negative.   Gastrointestinal:  Positive for abdominal pain, constipation, nausea and vomiting. Negative for blood in stool, diarrhea and melena.  Genitourinary: Negative.   Musculoskeletal: Negative.   Skin: Negative.   Neurological: Negative.   Endo/Heme/Allergies: Negative.    Psychiatric/Behavioral:  Positive for hallucinations and suicidal ideas.    Family History  Problem Relation Age of Onset   High blood pressure Mother    Alzheimer's disease Father    Colon cancer Neg Hx    Esophageal cancer Neg Hx    Rectal cancer Neg Hx    Stomach cancer Neg Hx     Past Medical History:  Diagnosis Date   Allergy    Anxiety    Asthma    uses inhalers    Bilateral carotid bruits    Cardiac conduction disorder 2018   s/p MDT PPM   Cataract    Chronic kidney disease    bladder interstial cystitis    Chronic kidney disease (CKD), stage IV (severe) ([place])    followed by Dr. Joelyn Oms at [facility]   COPD (chronic obstructive pulmonary disease) ([place])    Depression    Encounter for care of pacemaker 02/13/2019   GERD (gastroesophageal reflux disease)    History of stomach ulcers 2001   Hypertension    LBBB (left bundle branch block)    Lower extremity edema    Mild intermittent asthma without complication    Mixed hyperlipidemia    Mobitz type 2 second degree AV block 04/06/2019   Neuropathy    Pacemaker: Medtronic Azure XT DR MRI [id number] PPM -  BUNDLE OF HIS pacing  02/26/2017   Scheduled Remote pacemaker check  11/12/2018:  There were 24 Fast AV episodes:  EGMs show SVTs. Episodes lasted < 2 minutes. Health trends do not demonstrate significant abnormality. Battery longevity is 9.4 - 10.3 years. RA pacing is 47.1 %, RV pacing is 40.4 %.  Clinic check 11/07/17.    Paroxysmal atrial flutter ([place])  PONV (postoperative nausea and vomiting)    Prostatitis    Recurrent upper respiratory infection (URI)    Schizophrenia ([name])    Sinus node dysfunction ([place]) 02/13/2019   Urticaria     Past Surgical History:  Procedure Laterality Date   ADENOIDECTOMY     APPENDECTOMY     COLONOSCOPY     ELECTROPHYSIOLOGY STUDY N/A 02/26/2017   Procedure: ELECTROPHYSIOLOGY STUDY;  Surgeon: Evans Lance, MD;  Location: [facility];  Service: Cardiovascular;   Laterality: N/A;   ESOPHAGOGASTRODUODENOSCOPY (EGD) WITH PROPOFOL N/A 09/18/2016   Procedure: ESOPHAGOGASTRODUODENOSCOPY (EGD) WITH PROPOFOL;  Surgeon: Ronnette Juniper, MD;  Location: [facility];  Service: Gastroenterology;  Laterality: N/A;   LUMBAR LAMINECTOMY/DECOMPRESSION MICRODISCECTOMY  04/19/2011   Procedure: LUMBAR LAMINECTOMY/DECOMPRESSION MICRODISCECTOMY;  Surgeon: Tobi Bastos;  Location: [facility];  Service: Orthopedics;  Laterality: Left;  Hemi LAminectomy/Microdiscectomy Lumbar four  - Lumbar five  on the Left (X-Ray)   PACEMAKER IMPLANT N/A 02/26/2017   Procedure: PACEMAKER IMPLANT;  Surgeon: Evans Lance, MD;  Location: [facility];  Service: Cardiovascular;  Laterality: N/A;   TONSILLECTOMY     UPPER GASTROINTESTINAL ENDOSCOPY      Social History:  reports that he quit smoking about 12 years ago. He has a 30.00 pack-year smoking history. He has never used smokeless tobacco. He reports that he does not drink alcohol and does not use drugs.  Allergies:  Allergies  Allergen Reactions   Methylpyrrolidone Hives    froze the intestine   Niacin Itching, Nausea And Vomiting and Other (See Comments)    Flushing, itching, tingling    Ace Inhibitors     Other reaction(s): CKD 4   Norvasc [Amlodipine Besylate] Other (See Comments)    Swollen Feet   Other Other (See Comments)   Oxybutynin Chloride [Oxybutynin Chloride Er] Other (See Comments)    froze the intestine   Vesicare [Solifenacin Succinate] Other (See Comments)    Froze the intestine    Ciprofloxacin Rash and Other (See Comments)    Felt flushed    Oxybutynin Rash and Other (See Comments)    bowel obst   Solifenacin Rash    Medications Prior to Admission  Medication Sig Dispense Refill   acetaminophen (TYLENOL) 325 MG tablet Take 650 mg by mouth every 6 (six) hours as needed for mild pain or headache.     albuterol (VENTOLIN HFA) 108 (90 Base) MCG/ACT inhaler Inhale 2 puffs into the lungs every 6 (six)  hours as needed for wheezing or shortness of breath. 18 g 1   Budeson-Glycopyrrol-Formoterol (BREZTRI AEROSPHERE) 160-9-4.8 MCG/ACT AERO 2 puffs twice a day with a spacer (Patient taking differently: Inhale 2 puffs into the lungs 2 (two) times daily. with a spacer) 10.7 g 2   buPROPion (WELLBUTRIN SR) 150 MG 12 hr tablet Take 150 mg by mouth 2 (two) times daily.     cloNIDine (CATAPRES) 0.2 MG tablet Take 1 tablet (0.2 mg total) by mouth 2 (two) times daily. Please keep upcoming appt with Dr. Lovena Le in July 2022 before anymore refills. Thank you 60 tablet 1   Cyanocobalamin (VITAMIN B-12 PO) Take 1 tablet by mouth daily.     ezetimibe (ZETIA) 10 MG tablet Take 1 tablet (10 mg total) by mouth daily. 90 tablet 3   Fenofibrate 50 MG CAPS Take 1 capsule (50 mg total) by mouth daily. 30 capsule 11   fluticasone (FLONASE) 50 MCG/ACT nasal spray Place 1 spray into both nostrils daily as  needed for allergies. 11.1 mL 3   furosemide (LASIX) 20 MG tablet Take 1 tablet (20 mg total) by mouth daily. 30 tablet 3   gabapentin (NEURONTIN) 100 MG capsule Take 1 capsule (100 mg total) by mouth 3 (three) times daily. 90 capsule 11   hydrALAZINE (APRESOLINE) 50 MG tablet Take 1 tablet (50 mg total) by mouth 3 (three) times daily. 270 tablet 1   isosorbide dinitrate (ISORDIL) 30 MG tablet TAKE 1 TABLET(30 MG) BY MOUTH FOUR TIMES DAILY (Patient taking differently: Take 30 mg by mouth 4 (four) times daily.) 360 tablet 1   metoprolol tartrate (LOPRESSOR) 100 MG tablet Take 1 tablet (100 mg total) by mouth 2 (two) times daily. 180 tablet 1   montelukast (SINGULAIR) 10 MG tablet Take 1 tablet (10 mg total) by mouth at bedtime. 90 tablet 1   paliperidone (INVEGA) 6 MG 24 hr tablet Take 6 mg by mouth at bedtime.     Pancrelipase, Lip-Prot-Amyl, (ZENPEP) 40000-126000 units CPEP Take 1 tablet by mouth with breakfast, with lunch, and with evening meal. 90 capsule 2   pantoprazole (PROTONIX) 40 MG tablet Take 1 tablet (40 mg  total) by mouth 2 (two) times daily. 180 tablet 3   sucralfate (CARAFATE) 1 GM/10ML suspension Take 10 mLs (1 g total) by mouth 4 (four) times daily -  with meals and at bedtime. (Patient taking differently: Take 1 g by mouth 3 (three) times daily as needed.) 3600 mL 3   benazepril (LOTENSIN) 10 MG tablet Take 1 tablet (10 mg total) by mouth daily. (Patient not taking: Reported on 10/19/2020) 90 tablet 1   ondansetron (ZOFRAN ODT) 4 MG disintegrating tablet Take 1 tablet (4 mg total) by mouth every 8 (eight) hours as needed for nausea or vomiting. (Patient not taking: No sig reported) 20 tablet 0   tamsulosin (FLOMAX) 0.4 MG CAPS capsule Take 1 capsule (0.4 mg total) by mouth daily. (Patient not taking: No sig reported) 90 capsule 2    Blood pressure (!) 141/89, pulse 100, temperature 98.8 F (37.1 C), temperature source Oral, resp. rate 16, height 6\' 1"  (1.854 m), weight 97.3 kg, SpO2 94 %. Physical Exam: Constitutional: NAD; conversant; no deformities Eyes: Moist conjunctiva; no lid lag; anicteric; PERRL Neck: Trachea midline; no thyromegaly Lungs: Normal respiratory effort; no tactile fremitus. Intermittent wet cough. CV: RRR; no palpable thrills; no pitting edema. Palpable radial and pedal pulses bilaterally GI: Abd minimally distended. Diffuse mild TTP without rebound or guarding; no palpable hepatosplenomegaly. RLQ well healed surgical scar. Scant clear green/yellow output in NG cannister MSK: no clubbing/cyanosis. No calf TTP bilaterally Psychiatric: Appropriate affect; alert and oriented x3 Neuro: Cranial nerves 2-12 grossly intact, sensation is normal throughout  Results for orders placed or performed during the hospital encounter of 10/19/20 (from the past 48 hour(s))  CBC with Differential     Status: None   Collection Time: 10/21/20  3:36 PM  Result Value Ref Range   WBC 9.5 4.0 - 10.5 K/uL   RBC 5.50 4.22 - 5.81 MIL/uL   Hemoglobin 15.6 13.0 - 17.0 g/dL   HCT 48.6 39.0 - 52.0 %    MCV 88.4 80.0 - 100.0 fL   MCH 28.4 26.0 - 34.0 pg   MCHC 32.1 30.0 - 36.0 g/dL   RDW 12.8 11.5 - 15.5 %   Platelets 187 150 - 400 K/uL   nRBC 0.0 0.0 - 0.2 %   Neutrophils Relative % 79 %   Neutro Abs 7.6 1.7 -  7.7 K/uL   Lymphocytes Relative 12 %   Lymphs Abs 1.1 0.7 - 4.0 K/uL   Monocytes Relative 7 %   Monocytes Absolute 0.7 0.1 - 1.0 K/uL   Eosinophils Relative 1 %   Eosinophils Absolute 0.1 0.0 - 0.5 K/uL   Basophils Relative 0 %   Basophils Absolute 0.0 0.0 - 0.1 K/uL   Immature Granulocytes 1 %   Abs Immature Granulocytes 0.05 0.00 - 0.07 K/uL    Comment: Performed at [facility] [address] [place], [place] [address]  Comprehensive metabolic panel     Status: Abnormal   Collection Time: 10/21/20  3:36 PM  Result Value Ref Range   Sodium 137 135 - 145 mmol/L   Potassium 4.6 3.5 - 5.1 mmol/L   Chloride 100 98 - 111 mmol/L   CO2 27 22 - 32 mmol/L   Glucose, Bld 105 (H) 70 - 99 mg/dL    Comment: Glucose reference range applies only to samples taken after fasting for at least 8 hours.   BUN 41 (H) 8 - 23 mg/dL   Creatinine, Ser 3.25 (H) 0.61 - 1.24 mg/dL   Calcium 9.8 8.9 - 10.3 mg/dL   Total Protein 7.3 6.5 - 8.1 g/dL   Albumin 4.1 3.5 - 5.0 g/dL   AST 23 15 - 41 U/L   ALT 16 0 - 44 U/L   Alkaline Phosphatase 63 38 - 126 U/L   Total Bilirubin 0.9 0.3 - 1.2 mg/dL   GFR, Estimated 21 (L) >60 mL/min    Comment: (NOTE) Calculated using the CKD-EPI Creatinine Equation (2021)    Anion gap 10 5 - 15    Comment: Performed at [facility] [address] [place], [place] [address]  Lipase, blood     Status: None   Collection Time: 10/21/20  3:36 PM  Result Value Ref Range   Lipase 25 11 - 51 U/L    Comment: Performed at [facility] [address] [place], [place] [address]  HIV Antibody (routine testing w rflx)     Status: None   Collection Time: 10/22/20  1:49 AM  Result Value Ref Range   HIV Screen 4th Generation wRfx Non Reactive Non  Reactive    Comment: Performed at [facility], [place] [address] [place], [place] [address]  CBC with Differential/Platelet     Status: Abnormal   Collection Time: 10/22/20  1:49 AM  Result Value Ref Range   WBC 10.6 (H) 4.0 - 10.5 K/uL   RBC 5.53 4.22 - 5.81 MIL/uL   Hemoglobin 15.8 13.0 - 17.0 g/dL   HCT 47.4 39.0 - 52.0 %   MCV 85.7 80.0 - 100.0 fL   MCH 28.6 26.0 - 34.0 pg   MCHC 33.3 30.0 - 36.0 g/dL   RDW 12.6 11.5 - 15.5 %   Platelets 158 150 - 400 K/uL   nRBC 0.0 0.0 - 0.2 %   Neutrophils Relative % 80 %   Neutro Abs 8.4 (H) 1.7 - 7.7 K/uL   Lymphocytes Relative 11 %   Lymphs Abs 1.2 0.7 - 4.0 K/uL   Monocytes Relative 8 %   Monocytes Absolute 0.9 0.1 - 1.0 K/uL   Eosinophils Relative 1 %   Eosinophils Absolute 0.1 0.0 - 0.5 K/uL   Basophils Relative 0 %   Basophils Absolute 0.0 0.0 - 0.1 K/uL   Immature Granulocytes 0 %   Abs Immature Granulocytes 0.04 0.00 -  0.07 K/uL    Comment: Performed at [facility], [place] [address], [place], [place] [address]  Comprehensive metabolic panel     Status: Abnormal   Collection Time: 10/22/20  1:49 AM  Result Value Ref Range   Sodium 139 135 - 145 mmol/L   Potassium 4.1 3.5 - 5.1 mmol/L   Chloride 101 98 - 111 mmol/L   CO2 25 22 - 32 mmol/L   Glucose, Bld 105 (H) 70 - 99 mg/dL    Comment: Glucose reference range applies only to samples taken after fasting for at least 8 hours.   BUN 40 (H) 8 - 23 mg/dL   Creatinine, Ser 3.09 (H) 0.61 - 1.24 mg/dL   Calcium 9.8 8.9 - 10.3 mg/dL   Total Protein 7.3 6.5 - 8.1 g/dL   Albumin 4.0 3.5 - 5.0 g/dL   AST 23 15 - 41 U/L   ALT 16 0 - 44 U/L   Alkaline Phosphatase 58 38 - 126 U/L   Total Bilirubin 1.2 0.3 - 1.2 mg/dL   GFR, Estimated 22 (L) >60 mL/min    Comment: (NOTE) Calculated using the CKD-EPI Creatinine Equation (2021)    Anion gap 13 5 - 15    Comment: Performed at [facility] [address] [place], [place] [address]  Magnesium     Status: None    Collection Time: 10/22/20  1:49 AM  Result Value Ref Range   Magnesium 1.7 1.7 - 2.4 mg/dL    Comment: Performed at [facility] [address] [place], [place] [address]  C-reactive protein     Status: Abnormal   Collection Time: 10/22/20  1:49 AM  Result Value Ref Range   CRP 1.5 (H) <1.0 mg/dL    Comment: Performed at [facility] [address], [place], [place] [address]  Procalcitonin     Status: None   Collection Time: 10/22/20  1:49 AM  Result Value Ref Range   Procalcitonin <0.10 ng/mL    Comment:        Interpretation: PCT (Procalcitonin) <= 0.5 ng/mL: Systemic infection (sepsis) is not likely. Local bacterial infection is possible. (NOTE)       Sepsis PCT Algorithm           Lower Respiratory Tract                                      Infection PCT Algorithm    ----------------------------     ----------------------------         PCT < 0.25 ng/mL                PCT < 0.10 ng/mL          Strongly encourage             Strongly discourage   discontinuation of antibiotics    initiation of antibiotics    ----------------------------     -----------------------------       PCT 0.25 - 0.50 ng/mL            PCT 0.10 - 0.25 ng/mL               OR       >80% decrease in PCT            Discourage initiation of  antibiotics      Encourage discontinuation           of antibiotics    ----------------------------     -----------------------------         PCT >= 0.50 ng/mL              PCT 0.26 - 0.50 ng/mL               AND        <80% decrease in PCT             Encourage initiation of                                             antibiotics       Encourage continuation           of antibiotics    ----------------------------     -----------------------------        PCT >= 0.50 ng/mL                  PCT > 0.50 ng/mL               AND         increase in PCT                  Strongly encourage                                       initiation of antibiotics    Strongly encourage escalation           of antibiotics                                     -----------------------------                                           PCT <= 0.25 ng/mL                                                 OR                                        > 80% decrease in PCT                                      Discontinue / Do not initiate                                             antibiotics  Performed at [facility], [place] [address] [place], [place] [address]   D-dimer, quantitative     Status: Abnormal   Collection  Time: 10/22/20  1:49 AM  Result Value Ref Range   D-Dimer, Quant 4.80 (H) 0.00 - 0.50 ug/mL-FEU    Comment: (NOTE) At the manufacturer cut-off value of 0.5 g/mL FEU, this assay has a negative predictive value of 95-100%.This assay is intended for use in conjunction with a clinical pretest probability (PTP) assessment model to exclude pulmonary embolism (PE) and deep venous thrombosis (DVT) in outpatients suspected of PE or DVT. Results should be correlated with clinical presentation. Performed at [facility], [place] [address], [place], [place] [address]    CT Abdomen Pelvis Wo Contrast  Result Date: 10/21/2020 CLINICAL DATA:  60 year old male with abdominal pain. Concern for bowel obstruction. EXAM: CT ABDOMEN AND PELVIS WITHOUT CONTRAST TECHNIQUE: Multidetector CT imaging of the abdomen and pelvis was performed following the standard protocol without IV contrast. COMPARISON:  CT of the abdomen pelvis dated 06/17/2016. FINDINGS: Evaluation of this exam is limited in the absence of intravenous contrast. Lower chest: The visualized lung bases are clear. Cardiac pacemaker wires noted. No intra-abdominal free air or free fluid. Hepatobiliary: The liver is unremarkable. No intrahepatic biliary dilatation. Small gallstone. No pericholecystic fluid or evidence of acute cholecystitis by CT. Pancreas:  Scattered pancreatic calcification, sequela of prior pancreatitis. No active inflammatory changes. Spleen: Normal in size without focal abnormality. Adrenals/Urinary Tract: The adrenal glands are unremarkable. Mild left and severe right renal parenchyma atrophy. 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 There is moderate hydronephrosis of the upper pole moiety. Two adjacent stones noted in the upper pole collecting system each measuring approximately 9 mm and combined length of approximately 15 mm. There is severe hydronephrosis of the inferior pole moiety versus less likely a parapelvic cyst. This is similar to prior CT. A 7 mm calcification noted along the inferior aspect of dilated inferior pole moiety adjacent to the ureteropelvic junction. Several nonobstructing left renal calculi measure up to 9 mm in the inferior pole of the left kidney. No hydronephrosis on the left. A 4.7 cm hypodense lesion with fluid attenuation arising from the superior pole of the left kidney has increased in size since the prior CT. This likely represent cysts but suboptimally characterized on this noncontrast CT. Ultrasound may provide better evaluation. The visualized ureters appear unremarkable. There is a 5 mm stone in the distal right ureter adjacent to the right ureterovesical junction. Stomach/Bowel: There is moderate stool throughout the colon. Multiple mildly dilated fluid-filled loops of small bowel measure up to 4 cm in the left anterior abdomen. The terminal ileum and distal small bowel demonstrate a normal caliber. No definite transition identified. Although findings may represent enteritis and mild ileus an early obstruction is not excluded. Clinical correlation is recommended. Appendectomy. Vascular/Lymphatic: Moderate aortoiliac atherosclerotic disease. There is a 3.2 cm infrarenal aortic aneurysm, new since the prior CT. The IVC is unremarkable. No portal venous gas. There is no adenopathy.  Reproductive: Prostate and seminal vesicles are grossly unremarkable. No pelvic mass. Other: None Musculoskeletal: Degenerative changes of the spine. No acute osseous pathology. IMPRESSION: 1. Probable enteritis with mild ileus. An early obstruction is not excluded. Clinical correlation is recommended. 2. 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 3. Several nonobstructing bilateral renal calculi and a 5 mm distal right ureteral/right UVJ calculus. No hydronephrosis on the left. 4. A 3.2 cm infrarenal aortic aneurysm, new since the prior CT.Recommend follow-up ultrasound every 3 years. This recommendation follows ACR consensus guidelines:  White Paper of the ACR Incidental Findings Committee II on Vascular Findings. J Am Coll Radiol 2013; 10:789-794. 5. Cholelithiasis. 6. Aortic Atherosclerosis (ICD10-I70.0). Electronically Signed   By: Anner Crete M.D.   On: 10/21/2020 18:35   DG Abd 1 View  Result Date: 10/21/2020 CLINICAL DATA:  NG tube placement EXAM: ABDOMEN - 1 VIEW COMPARISON:  Radiograph 10/21/2020, CT 10/21/2020 FINDINGS: Transesophageal tube is coiled in the left upper quadrant with tip and side port distal to the GE junction. Telemetry leads overlie the upper abdomen. Multiple clustered loops of air distended small bowel are again seen in the upper abdomen compatible with known bowel obstruction. No suspicious calcifications. Degenerative changes in the imaged spine and pelvis. IMPRESSION: Transesophageal tube appropriately positioned within the gastric lumen. Persistent small bowel dilatation clustered in the left upper quadrant compatible with known obstruction. Electronically Signed   By: Lovena Le M.D.   On: 10/21/2020 23:33   DG Abd Acute W/Chest  Result Date: 10/21/2020 CLINICAL DATA:  Abdominal pain EXAM: DG ABDOMEN ACUTE WITH 1 VIEW CHEST COMPARISON:  Chest x-ray 11/19/2018 FINDINGS: Heart size and vascularity  normal. Lungs clear without infiltrate or effusion. Dual lead pacemaker unchanged. Mildly distended large and small bowel loops present diffusely. There is gas and stool in the rectum. No free air. IMPRESSION: No acute cardiopulmonary abnormality Mildly distended large and small bowel loops consistent with ileus. Electronically Signed   By: Franchot Gallo M.D.   On: 10/21/2020 14:41   DG Abd Portable 2V  Result Date: 10/22/2020 CLINICAL DATA:  60 year old male with suspected small bowel obstruction. EXAM: PORTABLE ABDOMEN - 2 VIEW COMPARISON:  10/21/2020 and earlier. FINDINGS: AP portable upright and supine views of the abdomen and pelvis. Enteric tube remains looped within the stomach. Negative lung bases. Partially visible cardiac pacer leads. No pneumoperitoneum. But continued gas-filled and dilated small bowel loops throughout the mid abdomen up to 46 mm diameter. Paucity of large bowel gas. Small volume retained stool in the rectum. Right nephrolithiasis redemonstrated. Stable visualized osseous structures. IMPRESSION: 1. Ongoing small bowel obstruction pattern, with stable dilated mid abdominal loops up to 46 mm diameter. 2. No free air. 3. Enteric tube within the stomach. Electronically Signed   By: Genevie Ann M.D.   On: 10/22/2020 06:11    Assessment/Plan Possible mild enteritis with ileus vs SBO  - CT Abd pelvis with mildly dilated loops of bowel in the left hemi-abdomen, there are some decompressed loops of small bowel in RLQ, but not definite transition zone appreciated by me when I look at the CT. Given his history of appendectomy it is possible he has RLQ adhesions that could cause a bowel obstruction. No hernias, masses, or other evidence of mechanical obstruction appreciated.  - Recommend SB protocol with gastrografin - Pt would likely benefit from an enema as well, has retained stool in the rectosigmoid.    FEN: NPO, IVF, NG LIWS ID: none VTE: SCD's, lovenox  Foley: none Dispo:  med-surg, TRH service, SBO protocol   Below per primary service --  AKI on CKD IV COVID-19 infection - not on oxygen HTN HLD GERD Schizoaffective disorder, bipolar type  Aneurysm of the infrarenal aorta - 3.2 cm   Jill Alexanders, [facility] Surgery Please see Amion for pager number during day hours 7:00am-4:30pm 10/22/2020, 11:44 AM

## 2020-10-22 NOTE — Plan of Care (Signed)

## 2020-10-23 ENCOUNTER — Encounter (HOSPITAL_COMMUNITY): Payer: PPO

## 2020-10-23 ENCOUNTER — Inpatient Hospital Stay (HOSPITAL_COMMUNITY): Payer: PPO

## 2020-10-23 DIAGNOSIS — K567 Ileus, unspecified: Secondary | ICD-10-CM | POA: Diagnosis not present

## 2020-10-23 LAB — CBC WITH DIFFERENTIAL/PLATELET
Abs Immature Granulocytes: 0.03 10*3/uL (ref 0.00–0.07)
Basophils Absolute: 0 10*3/uL (ref 0.0–0.1)
Basophils Relative: 0 %
Eosinophils Absolute: 0.1 10*3/uL (ref 0.0–0.5)
Eosinophils Relative: 1 %
HCT: 46.8 % (ref 39.0–52.0)
Hemoglobin: 15.2 g/dL (ref 13.0–17.0)
Immature Granulocytes: 1 %
Lymphocytes Relative: 18 %
Lymphs Abs: 1 10*3/uL (ref 0.7–4.0)
MCH: 28.4 pg (ref 26.0–34.0)
MCHC: 32.5 g/dL (ref 30.0–36.0)
MCV: 87.3 fL (ref 80.0–100.0)
Monocytes Absolute: 0.9 10*3/uL (ref 0.1–1.0)
Monocytes Relative: 16 %
Neutro Abs: 3.7 10*3/uL (ref 1.7–7.7)
Neutrophils Relative %: 64 %
Platelets: 202 10*3/uL (ref 150–400)
RBC: 5.36 MIL/uL (ref 4.22–5.81)
RDW: 13 % (ref 11.5–15.5)
WBC: 5.8 10*3/uL (ref 4.0–10.5)
nRBC: 0 % (ref 0.0–0.2)

## 2020-10-23 LAB — MAGNESIUM: Magnesium: 2.2 mg/dL (ref 1.7–2.4)

## 2020-10-23 LAB — COMPREHENSIVE METABOLIC PANEL
ALT: 14 U/L (ref 0–44)
AST: 20 U/L (ref 15–41)
Albumin: 3.7 g/dL (ref 3.5–5.0)
Alkaline Phosphatase: 52 U/L (ref 38–126)
Anion gap: 11 (ref 5–15)
BUN: 33 mg/dL — ABNORMAL HIGH (ref 8–23)
CO2: 26 mmol/L (ref 22–32)
Calcium: 9 mg/dL (ref 8.9–10.3)
Chloride: 103 mmol/L (ref 98–111)
Creatinine, Ser: 2.78 mg/dL — ABNORMAL HIGH (ref 0.61–1.24)
GFR, Estimated: 25 mL/min — ABNORMAL LOW (ref >60)
Glucose, Bld: 112 mg/dL — ABNORMAL HIGH (ref 70–99)
Potassium: 4 mmol/L (ref 3.5–5.1)
Sodium: 140 mmol/L (ref 135–145)
Total Bilirubin: 1.3 mg/dL — ABNORMAL HIGH (ref 0.3–1.2)
Total Protein: 6.7 g/dL (ref 6.5–8.1)

## 2020-10-23 LAB — C-REACTIVE PROTEIN: CRP: 7.4 mg/dL — ABNORMAL HIGH (ref <1.0)

## 2020-10-23 LAB — MRSA PCR SCREENING: MRSA by PCR: NEGATIVE

## 2020-10-23 LAB — D-DIMER, QUANTITATIVE: D-Dimer, Quant: 2.65 ug/mL-FEU — ABNORMAL HIGH (ref 0.00–0.50)

## 2020-10-23 MED ORDER — ENOXAPARIN SODIUM 40 MG/0.4ML IJ SOSY
40.0000 mg | PREFILLED_SYRINGE | INTRAMUSCULAR | Status: DC
  Administered 2020-10-23 – 2020-10-24 (×2): 40 mg via SUBCUTANEOUS
  Filled 2020-10-23 (×2): qty 0.4

## 2020-10-23 MED ORDER — HYDRALAZINE HCL 20 MG/ML IJ SOLN
10.0000 mg | Freq: Four times a day (QID) | INTRAMUSCULAR | Status: DC | PRN
  Administered 2020-10-24 – 2020-10-25 (×2): 10 mg via INTRAVENOUS
  Filled 2020-10-23 (×2): qty 1

## 2020-10-23 MED ORDER — KCL-LACTATED RINGERS-D5W 20 MEQ/L IV SOLN
INTRAVENOUS | Status: DC
  Filled 2020-10-23 (×2): qty 1000

## 2020-10-23 MED ORDER — BISACODYL 10 MG RE SUPP
10.0000 mg | Freq: Once | RECTAL | Status: AC
  Administered 2020-10-23: 10 mg via RECTAL
  Filled 2020-10-23: qty 1

## 2020-10-23 NOTE — Progress Notes (Signed)
PROGRESS NOTE                                                                                                                                                                                                             Patient Demographics:    Peter Marsh, is a 60 y.o. male, DOB - June 07, 1960, HLK:[id number]  Outpatient Primary MD for the patient is Vevelyn Francois, NP   Admit date - 10/19/2020   LOS - 1  Chief Complaint  Patient presents with   Medical Clearance       Brief Narrative: Patient is a 60 y.o. male with PMHx of schizophrenia, CKD stage IV, HTN, HLD, PAF (not on anticoagulation due to GI bleeding), s/p permanent pacemaker implantation-who was admitted for evaluation of SBO.  He was evaluated on 6/13 at [facility] for suicidal ideation-transferred to [facility] ED on 6/14 as he was COVID-positive.  While at [facility] ED-he developed SBO.  See below for further details.   COVID-19 vaccinated status: Vaccinated-boosted x1  Significant Events: 6/13>> hearing voices-suicidal ideation-evaluated at [facility] 6/14>> transfer to [facility] ED for COVID-positive status 6/16>> while at [facility] ED-cleared by psych for discharge. 6/16>> admit to [facility] for SBO/ileus  Significant studies: 6/14>>Chest x-ray: No pneumonia 6/16>> x-ray abdomen: Distended large and small bowel loops. 6/16>> CT abdomen/pelvis: Enteritis/mild ileus-early obstruction not excluded. 6/17>> x-ray abdomen: NG tube in place-ongoing SBO pattern.  COVID-19 medications: None  Antibiotics: None  Microbiology data: None  Procedures: None  Consults: General surgery  DVT prophylaxis: enoxaparin (LOVENOX) injection 30 mg Start: 10/21/20 2300    Subjective:   Patient in bed appears to be in no distress denies any headache chest or abdominal pain, passing flatus and had a small bowel movement   Assessment  & Plan :   SBO/ileus: he is being followed by general surgery and  this problem is being managed by general surgery.  Currently has NG tube, some bowel activity with 1 small BM morning of 10/23/2020, will defer advancing diet to general surgery for now n.p.o. except meds with IV fluids.  AKI on CKD stage IV: AKI hemodynamically mediated-improving with IV fluids.  Appears to have a small distal right ureteral stone (denies any renal colic like symptoms) that probably should pass spontaneously given its size-will continue to observe closely.  1 episode of hematemesis on day of  admission: None so far-hemoglobin stable-monitor on PPI-could have had a small Mallory-Weiss tear.  If hematemesis recurs-we will consult GI.  COVID-19 infection: Diagnosed on 6/13-minimal cough but really no other symptoms-no hypoxemia.  Suspect that reasonable to observe without initiating any treatment at this point.  HTN: BP currently controlled-on transdermal clonidine, and IV hydralazine and IV Lopressor-given the fact that he is NPO.  Resume oral antihypertensives when able.  History of atrial flutter: Reviewed outpatient cardiology note-no anticoagulation due to GI bleed  History of sinus node dysfunction-s/p PPM in place  GERD: Continue PPI  Bipolar disorder/schizoaffective-suicidal ideation: Cleared by psychiatry on 6/16-resume psychotropic medications when oral intake resumes.  Currently not suicidal or homicidal.  Several nonobstructing bilateral renal calculi-5 mm distal right ureteral/right UVJ calculus: Likely to pass spontaneously-per CT-hydronephrosis is similar to prior CT scans.   3.2 cm infrarenal aortic aneurysm: Incidental finding-radiology recommends ultrasound every 3 years.  Obesity: Estimated body mass index is 28.3 kg/m as calculated from the following:   Height as of this encounter: 6\' 1"  (1.854 m).   Weight as of this encounter: 97.3 kg.      Condition - Stable  Family Communication  :  None at bedside-will reach out to family over the next few  days  Code Status :  Full Code  Diet :  Diet Order             Diet NPO time specified Except for: Ice Chips  Diet effective now                    Disposition Plan  :   Status is: Observation  The patient will require care spanning > 2 midnights and should be moved to inpatient because: Inpatient level of care appropriate due to severity of illness  Dispo: The patient is from: Home              Anticipated d/c is to: Home              Patient currently is not medically stable to d/c.   Difficult to place patient No     Barriers to discharge: Persistent ileus/SBO-renal function also improving-noted at baseline.  Remains NPO-NGT in place  Antimicorbials  :    Anti-infectives (From admission, onward)    None       Inpatient Medications  Scheduled Meds:  bisacodyl  10 mg Rectal Once   cloNIDine  0.2 mg Transdermal Q Thu   enoxaparin (LOVENOX) injection  30 mg Subcutaneous Q24H   metoprolol tartrate  5 mg Intravenous Q6H   mometasone-formoterol  2 puff Inhalation BID   And   umeclidinium bromide  1 puff Inhalation Daily   pantoprazole (PROTONIX) IV  40 mg Intravenous Q12H   Continuous Infusions:  dextrose 5% lactated ringers with KCl 20 mEq/L     PRN Meds:.albuterol, fluticasone, guaiFENesin-dextromethorphan, hydrALAZINE, ondansetron (ZOFRAN) IV   Time Spent in minutes  35   See all Orders from today for further details   Lala Lund M.D on 10/23/2020 at 12:52 PM  To page go to [email or web address] - use universal password  [facility] -  Office  [phone]    Objective:   Vitals:   10/23/20 0426 10/23/20 0751 10/23/20 0822 10/23/20 1203  BP: (!) 184/99 (!) 147/90 (!) 126/91 (!) 147/97  Pulse: 89 97 98 99  Resp: 19 20 (!) 23 17  Temp: 98.1 F (36.7 C) 99.7 F (37.6 C) 98 F (36.7 C) 98.7 F (37.1  C)  TempSrc: Oral Axillary Oral Axillary  SpO2: 92% 91% 95% 93%  Weight:      Height:        Wt Readings from Last 3 Encounters:   10/22/20 97.3 kg  10/19/20 99.8 kg  09/20/20 103 kg     Intake/Output Summary (Last 24 hours) at 10/23/2020 1252 Last data filed at 10/23/2020 0123 Gross per 24 hour  Intake 868.79 ml  Output 450 ml  Net 418.79 ml     Physical Exam  Awake Alert, No new F.N deficits, NG in place [place].AT,PERRAL Supple Neck,No JVD, No cervical lymphadenopathy appriciated.  Symmetrical Chest wall movement, Good air movement bilaterally, CTAB RRR,No Gallops, Rubs or new Murmurs, No Parasternal Heave +ve B.Sounds, Abd Soft, No tenderness,   No Cyanosis, Clubbing or edema, No new Rash or bruise    Data Review:    CBC Recent Labs  Lab 10/19/20 0825 10/21/20 1536 10/22/20 0149 10/23/20 0223  WBC 4.7 9.5 10.6* 5.8  HGB 13.6 15.6 15.8 15.2  HCT 42.5 48.6 47.4 46.8  PLT 165 187 158 202  MCV 88.4 88.4 85.7 87.3  MCH 28.3 28.4 28.6 28.4  MCHC 32.0 32.1 33.3 32.5  RDW 12.7 12.8 12.6 13.0  LYMPHSABS 1.6 1.1 1.2 1.0  MONOABS 0.6 0.7 0.9 0.9  EOSABS 0.2 0.1 0.1 0.1  BASOSABS 0.0 0.0 0.0 0.0    Chemistries  Recent Labs  Lab 10/19/20 0825 10/21/20 1536 10/22/20 0149 10/23/20 0223  NA 136 137 139 140  K 4.4 4.6 4.1 4.0  CL 107 100 101 103  CO2 23 27 25 26   GLUCOSE 95 105* 105* 112*  BUN 28* 41* 40* 33*  CREATININE 2.50* 3.25* 3.09* 2.78*  CALCIUM 8.8* 9.8 9.8 9.0  MG  --   --  1.7 2.2  AST 21 23 23 20   ALT 16 16 16 14   ALKPHOS 59 63 58 52  BILITOT 0.5 0.9 1.2 1.3*   ------------------------------------------------------------------------------------------------------------------ No results for input(s): CHOL, HDL, LDLCALC, TRIG, CHOLHDL, LDLDIRECT in the last 72 hours.  Lab Results  Component Value Date   HGBA1C 5.5 09/08/2019   ------------------------------------------------------------------------------------------------------------------ No results for input(s): TSH, T4TOTAL, T3FREE, THYROIDAB in the last 72 hours.  Invalid input(s):  FREET3 ------------------------------------------------------------------------------------------------------------------ No results for input(s): VITAMINB12, FOLATE, FERRITIN, TIBC, IRON, RETICCTPCT in the last 72 hours.  Coagulation profile No results for input(s): INR, PROTIME in the last 168 hours.  Recent Labs    10/22/20 0149 10/23/20 0223  DDIMER 4.80* 2.65*    Cardiac Enzymes No results for input(s): CKMB, TROPONINI, MYOGLOBIN in the last 168 hours.  Invalid input(s): CK ------------------------------------------------------------------------------------------------------------------    Component Value Date/Time   BNP 100.4 (H) 12/08/2019 3710    Micro Results Recent Results (from the past 240 hour(s))  Resp Panel by RT-PCR (Flu A&B, Covid) Nasopharyngeal Swab     Status: Abnormal   Collection Time: 10/18/20 11:43 PM   Specimen: Nasopharyngeal Swab; Nasopharyngeal(NP) swabs in vial transport medium  Result Value Ref Range Status   SARS Coronavirus 2 by RT PCR POSITIVE (A) NEGATIVE Final    Comment: RESULT CALLED TO, READ BACK BY AND VERIFIED WITH: RN AMY CLLOODSELTER BY MESSAN H. AT 0250 ON 6 14/2022 (NOTE) SARS-CoV-2 target nucleic acids are DETECTED.  The SARS-CoV-2 RNA is generally detectable in upper respiratory specimens during the acute phase of infection. Positive results are indicative of the presence of the identified virus, but do not rule out bacterial infection or co-infection with other pathogens not  detected by the test. Clinical correlation with patient history and other diagnostic information is necessary to determine patient infection status. The expected result is Negative.  Fact Sheet for Patients: EntrepreneurPulse.com.au  Fact Sheet for Healthcare Providers: IncredibleEmployment.be  This test is not yet approved or cleared by the [place] FDA and  has been authorized for detection and/or diagnosis  of SARS-CoV-2 by FDA under an Emergency Use Authorization (EUA).  This EUA will remain in effect (mean ing this test can be used) for the duration of  the COVID-19 declaration under Section 564(b)(1) of the Act, 21 U.S.C. section 360bbb-3(b)(1), unless the authorization is terminated or revoked sooner.     Influenza A by PCR NEGATIVE NEGATIVE Final   Influenza B by PCR NEGATIVE NEGATIVE Final    Comment: (NOTE) The Xpert Xpress SARS-CoV-2/FLU/RSV plus assay is intended as an aid in the diagnosis of influenza from Nasopharyngeal swab specimens and should not be used as a sole basis for treatment. Nasal washings and aspirates are unacceptable for Xpert Xpress SARS-CoV-2/FLU/RSV testing.  Fact Sheet for Patients: EntrepreneurPulse.com.au  Fact Sheet for Healthcare Providers: IncredibleEmployment.be  This test is not yet approved or cleared by the [place] FDA and has been authorized for detection and/or diagnosis of SARS-CoV-2 by FDA under an Emergency Use Authorization (EUA). This EUA will remain in effect (meaning this test can be used) for the duration of the COVID-19 declaration under Section 564(b)(1) of the Act, 21 U.S.C. section 360bbb-3(b)(1), unless the authorization is terminated or revoked.  Performed at [facility], [place] [address] [place], [place] [address]   MRSA PCR Screening     Status: None   Collection Time: 10/23/20 12:23 AM  Result Value Ref Range Status   MRSA by PCR NEGATIVE NEGATIVE Final    Comment:        The GeneXpert MRSA Assay (FDA approved for NASAL specimens only), is one component of a comprehensive MRSA colonization surveillance program. It is not intended to diagnose MRSA infection nor to guide or monitor treatment for MRSA infections. Performed at [facility], [place] [address] [place], [place] [address]     Radiology Reports CT Abdomen Pelvis Wo Contrast  Result Date:  10/21/2020 CLINICAL DATA:  60 year old male with abdominal pain. Concern for bowel obstruction. EXAM: CT ABDOMEN AND PELVIS WITHOUT CONTRAST TECHNIQUE: Multidetector CT imaging of the abdomen and pelvis was performed following the standard protocol without IV contrast. COMPARISON:  CT of the abdomen pelvis dated 06/17/2016. FINDINGS: Evaluation of this exam is limited in the absence of intravenous contrast. Lower chest: The visualized lung bases are clear. Cardiac pacemaker wires noted. No intra-abdominal free air or free fluid. Hepatobiliary: The liver is unremarkable. No intrahepatic biliary dilatation. Small gallstone. No pericholecystic fluid or evidence of acute cholecystitis by CT. Pancreas: Scattered pancreatic calcification, sequela of prior pancreatitis. No active inflammatory changes. Spleen: Normal in size without focal abnormality. Adrenals/Urinary Tract: The adrenal glands are unremarkable. Mild left and severe right renal parenchyma atrophy. 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 There is moderate hydronephrosis of the upper pole moiety. Two adjacent stones noted in the upper pole collecting system each measuring approximately 9 mm and combined length of approximately 15 mm. There is severe hydronephrosis of the inferior pole moiety versus less likely a parapelvic cyst. This is similar to prior CT. A 7 mm calcification noted along the inferior aspect of dilated inferior pole moiety adjacent to the ureteropelvic junction. Several nonobstructing left  renal calculi measure up to 9 mm in the inferior pole of the left kidney. No hydronephrosis on the left. A 4.7 cm hypodense lesion with fluid attenuation arising from the superior pole of the left kidney has increased in size since the prior CT. This likely represent cysts but suboptimally characterized on this noncontrast CT. Ultrasound may provide better evaluation. The visualized ureters appear unremarkable. There is a 5 mm  stone in the distal right ureter adjacent to the right ureterovesical junction. Stomach/Bowel: There is moderate stool throughout the colon. Multiple mildly dilated fluid-filled loops of small bowel measure up to 4 cm in the left anterior abdomen. The terminal ileum and distal small bowel demonstrate a normal caliber. No definite transition identified. Although findings may represent enteritis and mild ileus an early obstruction is not excluded. Clinical correlation is recommended. Appendectomy. Vascular/Lymphatic: Moderate aortoiliac atherosclerotic disease. There is a 3.2 cm infrarenal aortic aneurysm, new since the prior CT. The IVC is unremarkable. No portal venous gas. There is no adenopathy. Reproductive: Prostate and seminal vesicles are grossly unremarkable. No pelvic mass. Other: None Musculoskeletal: Degenerative changes of the spine. No acute osseous pathology. IMPRESSION: 1. Probable enteritis with mild ileus. An early obstruction is not excluded. Clinical correlation is recommended. 2. 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 3. Several nonobstructing bilateral renal calculi and a 5 mm distal right ureteral/right UVJ calculus. No hydronephrosis on the left. 4. A 3.2 cm infrarenal aortic aneurysm, new since the prior CT.Recommend follow-up ultrasound every 3 years. This recommendation follows ACR consensus guidelines: White Paper of the ACR Incidental Findings Committee II on Vascular Findings. J Am Coll Radiol 2013; 10:789-794. 5. Cholelithiasis. 6. Aortic Atherosclerosis (ICD10-I70.0). Electronically Signed   By: Anner Crete M.D.   On: 10/21/2020 18:35   DG Abd 1 View  Result Date: 10/21/2020 CLINICAL DATA:  NG tube placement EXAM: ABDOMEN - 1 VIEW COMPARISON:  Radiograph 10/21/2020, CT 10/21/2020 FINDINGS: Transesophageal tube is coiled in the left upper quadrant with tip and side port distal to the GE junction.  Telemetry leads overlie the upper abdomen. Multiple clustered loops of air distended small bowel are again seen in the upper abdomen compatible with known bowel obstruction. No suspicious calcifications. Degenerative changes in the imaged spine and pelvis. IMPRESSION: Transesophageal tube appropriately positioned within the gastric lumen. Persistent small bowel dilatation clustered in the left upper quadrant compatible with known obstruction. Electronically Signed   By: Lovena Le M.D.   On: 10/21/2020 23:33   DG Chest Portable 1 View  Result Date: 10/19/2020 CLINICAL DATA:  COVID. Additional history provided: COVID positive 6/13. Patient reports chest pain, shortness of breath, cough. EXAM: PORTABLE CHEST 1 VIEW COMPARISON:  Prior chest radiographs 07/11/2020 and earlier. FINDINGS: Redemonstrated left chest dual lead implantable cardiac device. Mild cardiomegaly, unchanged. Aortic atherosclerosis. No appreciable airspace consolidation or pulmonary edema. No evidence of pleural effusion or pneumothorax. No acute bony abnormality identified. IMPRESSION: No evidence of acute cardiopulmonary abnormality. Mild cardiomegaly, unchanged. Aortic Atherosclerosis (ICD10-I70.0). Electronically Signed   By: Kellie Simmering DO   On: 10/19/2020 08:46   DG Abd Acute W/Chest  Result Date: 10/21/2020 CLINICAL DATA:  Abdominal pain EXAM: DG ABDOMEN ACUTE WITH 1 VIEW CHEST COMPARISON:  Chest x-ray 11/19/2018 FINDINGS: Heart size and vascularity normal. Lungs clear without infiltrate or effusion. Dual lead pacemaker unchanged. Mildly distended large and small bowel loops present diffusely. There is gas and stool in the rectum. No free  air. IMPRESSION: No acute cardiopulmonary abnormality Mildly distended large and small bowel loops consistent with ileus. Electronically Signed   By: Franchot Gallo M.D.   On: 10/21/2020 14:41   DG Abd Portable 1V-Small Bowel Obstruction Protocol-initial, 8 hr delay  Result Date:  10/23/2020 CLINICAL DATA:  Small-bowel obstruction EXAM: PORTABLE ABDOMEN - 1 VIEW COMPARISON:  10/22/2020 at 5:52 a.m. FINDINGS: Two supine frontal views of the abdomen and pelvis are obtained. Enteric catheter coiled over the gastric fundus. Diffuse gaseous distention of the small bowel again noted, measuring up to 4.9 cm in diameter. Gas and stool are seen throughout the colon to the level of the rectum. No masses or abnormal calcifications. IMPRESSION: 1. Persistent small bowel obstruction, with stable caliber of the small bowel. 2. Stable enteric catheter. Electronically Signed   By: Randa Ngo M.D.   On: 10/23/2020 02:42   DG Abd Portable 2V  Result Date: 10/22/2020 CLINICAL DATA:  60 year old male with suspected small bowel obstruction. EXAM: PORTABLE ABDOMEN - 2 VIEW COMPARISON:  10/21/2020 and earlier. FINDINGS: AP portable upright and supine views of the abdomen and pelvis. Enteric tube remains looped within the stomach. Negative lung bases. Partially visible cardiac pacer leads. No pneumoperitoneum. But continued gas-filled and dilated small bowel loops throughout the mid abdomen up to 46 mm diameter. Paucity of large bowel gas. Small volume retained stool in the rectum. Right nephrolithiasis redemonstrated. Stable visualized osseous structures. IMPRESSION: 1. Ongoing small bowel obstruction pattern, with stable dilated mid abdominal loops up to 46 mm diameter. 2. No free air. 3. Enteric tube within the stomach. Electronically Signed   By: Genevie Ann M.D.   On: 10/22/2020 06:11   CUP PACEART REMOTE DEVICE CHECK  Result Date: 09/28/2020 Scheduled remote reviewed. Normal device function.  RP Next remote 91 days.

## 2020-10-23 NOTE — Progress Notes (Signed)
   10/23/20 0027  Assess: MEWS Score  Temp 98.6 F (37 C)  Resp 16  Level of Consciousness Alert  O2 Device Room Air  Patient Activity (if Appropriate) In bed  Assess: MEWS Score  MEWS Temp 0  MEWS Systolic 0  MEWS Pulse 2  MEWS RR 0  MEWS LOC 0  MEWS Score 2  MEWS Score Color Yellow  Assess: if the MEWS score is Yellow or Red  Were vital signs taken at a resting state? Yes  Focused Assessment No change from prior assessment  Early Detection of Sepsis Score *See Row Information* Low  MEWS guidelines implemented *See Row Information* Yes  Treat  MEWS Interventions Administered scheduled meds/treatments  Pain Scale 0-10  Pain Score 4  Pain Type Chronic pain  Pain Location Back  Pain Orientation Lower  Pain Intervention(s) Repositioned;Cold applied  Escalate  MEWS: Escalate Yellow: discuss with charge nurse/RN and consider discussing with provider and RRT  Notify: Charge Nurse/RN  Name of Charge Nurse/RN Notified Isaiah RN  Date Charge Nurse/RN Notified 10/23/20  Time Charge Nurse/RN Notified 0027  Document  Patient Outcome Stabilized after interventions

## 2020-10-23 NOTE — Progress Notes (Signed)
Subjective: CC: Notes reviewed from overnight.  Patient currently reports that he feels much better.  He reports that he has no abdominal pain and his nausea resolved.  He reports he had a 2 small marble sized BM yesterday and is passed flatus x2.  He still feels mildly bloated.  Nurse at bedside and reports that patient has been complaining of nausea still and was just given Zofran. NGT output bilious with 650 out in cannister.   Objective: Vital signs in last 24 hours: Temp:  [98.1 F (36.7 C)-99.7 F (37.6 C)] 99.7 F (37.6 C) (06/18 0751) Pulse Rate:  [89-120] 97 (06/18 0751) Resp:  [15-20] 20 (06/18 0751) BP: (127-184)/(90-114) 147/90 (06/18 0751) SpO2:  [91 %-94 %] 91 % (06/18 0751)    Intake/Output from previous day: 06/17 0701 - 06/18 0700 In: 868.8 [I.V.:868.8] Out: 750 [Urine:600; Emesis/NG output:150] Intake/Output this shift: No intake/output data recorded.  PE: Gen:  Alert, NAD, pleasant Heart: Tachycardic Pulm:  Normal rate and effort  Abd: Soft with mild distension.  Mild suprapubic tenderness without peritonitis.  Normoactive bowel sounds.  No hernias palpated. NGT w/ 650 bilious output on LIWS Skin: no rashes noted, warm and dry   Lab Results:  Recent Labs    10/22/20 0149 10/23/20 0223  WBC 10.6* 5.8  HGB 15.8 15.2  HCT 47.4 46.8  PLT 158 202   BMET Recent Labs    10/22/20 0149 10/23/20 0223  NA 139 140  K 4.1 4.0  CL 101 103  CO2 25 26  GLUCOSE 105* 112*  BUN 40* 33*  CREATININE 3.09* 2.78*  CALCIUM 9.8 9.0   PT/INR No results for input(s): LABPROT, INR in the last 72 hours. CMP     Component Value Date/Time   NA 140 10/23/2020 0223   NA 137 09/20/2020 0943   K 4.0 10/23/2020 0223   CL 103 10/23/2020 0223   CO2 26 10/23/2020 0223   GLUCOSE 112 (H) 10/23/2020 0223   BUN 33 (H) 10/23/2020 0223   BUN 19 09/20/2020 0943   CREATININE 2.78 (H) 10/23/2020 0223   CALCIUM 9.0 10/23/2020 0223   PROT 6.7 10/23/2020 0223   PROT  6.7 09/20/2020 0943   ALBUMIN 3.7 10/23/2020 0223   ALBUMIN 4.1 09/20/2020 0943   AST 20 10/23/2020 0223   ALT 14 10/23/2020 0223   ALKPHOS 52 10/23/2020 0223   BILITOT 1.3 (H) 10/23/2020 0223   BILITOT 0.3 09/20/2020 0943   GFRNONAA 25 (L) 10/23/2020 0223   GFRAA 40 (L) 12/08/2019 0832   Lipase     Component Value Date/Time   LIPASE 25 10/21/2020 1536       Studies/Results: CT Abdomen Pelvis Wo Contrast  Result Date: 10/21/2020 CLINICAL DATA:  60 year old male with abdominal pain. Concern for bowel obstruction. EXAM: CT ABDOMEN AND PELVIS WITHOUT CONTRAST TECHNIQUE: Multidetector CT imaging of the abdomen and pelvis was performed following the standard protocol without IV contrast. COMPARISON:  CT of the abdomen pelvis dated 06/17/2016. FINDINGS: Evaluation of this exam is limited in the absence of intravenous contrast. Lower chest: The visualized lung bases are clear. Cardiac pacemaker wires noted. No intra-abdominal free air or free fluid. Hepatobiliary: The liver is unremarkable. No intrahepatic biliary dilatation. Small gallstone. No pericholecystic fluid or evidence of acute cholecystitis by CT. Pancreas: Scattered pancreatic calcification, sequela of prior pancreatitis. No active inflammatory changes. Spleen: Normal in size without focal abnormality. Adrenals/Urinary Tract: The adrenal glands are unremarkable. Mild left and severe  right renal parenchyma atrophy. 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 There is moderate hydronephrosis of the upper pole moiety. Two adjacent stones noted in the upper pole collecting system each measuring approximately 9 mm and combined length of approximately 15 mm. There is severe hydronephrosis of the inferior pole moiety versus less likely a parapelvic cyst. This is similar to prior CT. A 7 mm calcification noted along the inferior aspect of dilated inferior pole moiety adjacent to the ureteropelvic junction. Several  nonobstructing left renal calculi measure up to 9 mm in the inferior pole of the left kidney. No hydronephrosis on the left. A 4.7 cm hypodense lesion with fluid attenuation arising from the superior pole of the left kidney has increased in size since the prior CT. This likely represent cysts but suboptimally characterized on this noncontrast CT. Ultrasound may provide better evaluation. The visualized ureters appear unremarkable. There is a 5 mm stone in the distal right ureter adjacent to the right ureterovesical junction. Stomach/Bowel: There is moderate stool throughout the colon. Multiple mildly dilated fluid-filled loops of small bowel measure up to 4 cm in the left anterior abdomen. The terminal ileum and distal small bowel demonstrate a normal caliber. No definite transition identified. Although findings may represent enteritis and mild ileus an early obstruction is not excluded. Clinical correlation is recommended. Appendectomy. Vascular/Lymphatic: Moderate aortoiliac atherosclerotic disease. There is a 3.2 cm infrarenal aortic aneurysm, new since the prior CT. The IVC is unremarkable. No portal venous gas. There is no adenopathy. Reproductive: Prostate and seminal vesicles are grossly unremarkable. No pelvic mass. Other: None Musculoskeletal: Degenerative changes of the spine. No acute osseous pathology. IMPRESSION: 1. Probable enteritis with mild ileus. An early obstruction is not excluded. Clinical correlation is recommended. 2. 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 3. Several nonobstructing bilateral renal calculi and a 5 mm distal right ureteral/right UVJ calculus. No hydronephrosis on the left. 4. A 3.2 cm infrarenal aortic aneurysm, new since the prior CT.Recommend follow-up ultrasound every 3 years. This recommendation follows ACR consensus guidelines: White Paper of the ACR Incidental Findings Committee II on Vascular  Findings. J Am Coll Radiol 2013; 10:789-794. 5. Cholelithiasis. 6. Aortic Atherosclerosis (ICD10-I70.0). Electronically Signed   By: Anner Crete M.D.   On: 10/21/2020 18:35   DG Abd 1 View  Result Date: 10/21/2020 CLINICAL DATA:  NG tube placement EXAM: ABDOMEN - 1 VIEW COMPARISON:  Radiograph 10/21/2020, CT 10/21/2020 FINDINGS: Transesophageal tube is coiled in the left upper quadrant with tip and side port distal to the GE junction. Telemetry leads overlie the upper abdomen. Multiple clustered loops of air distended small bowel are again seen in the upper abdomen compatible with known bowel obstruction. No suspicious calcifications. Degenerative changes in the imaged spine and pelvis. IMPRESSION: Transesophageal tube appropriately positioned within the gastric lumen. Persistent small bowel dilatation clustered in the left upper quadrant compatible with known obstruction. Electronically Signed   By: Lovena Le M.D.   On: 10/21/2020 23:33   DG Abd Acute W/Chest  Result Date: 10/21/2020 CLINICAL DATA:  Abdominal pain EXAM: DG ABDOMEN ACUTE WITH 1 VIEW CHEST COMPARISON:  Chest x-ray 11/19/2018 FINDINGS: Heart size and vascularity normal. Lungs clear without infiltrate or effusion. Dual lead pacemaker unchanged. Mildly distended large and small bowel loops present diffusely. There is gas and stool in the rectum. No free air. IMPRESSION: No acute cardiopulmonary abnormality Mildly distended large and small bowel loops consistent  with ileus. Electronically Signed   By: Franchot Gallo M.D.   On: 10/21/2020 14:41   DG Abd Portable 1V-Small Bowel Obstruction Protocol-initial, 8 hr delay  Result Date: 10/23/2020 CLINICAL DATA:  Small-bowel obstruction EXAM: PORTABLE ABDOMEN - 1 VIEW COMPARISON:  10/22/2020 at 5:52 a.m. FINDINGS: Two supine frontal views of the abdomen and pelvis are obtained. Enteric catheter coiled over the gastric fundus. Diffuse gaseous distention of the small bowel again noted,  measuring up to 4.9 cm in diameter. Gas and stool are seen throughout the colon to the level of the rectum. No masses or abnormal calcifications. IMPRESSION: 1. Persistent small bowel obstruction, with stable caliber of the small bowel. 2. Stable enteric catheter. Electronically Signed   By: Randa Ngo M.D.   On: 10/23/2020 02:42   DG Abd Portable 2V  Result Date: 10/22/2020 CLINICAL DATA:  60 year old male with suspected small bowel obstruction. EXAM: PORTABLE ABDOMEN - 2 VIEW COMPARISON:  10/21/2020 and earlier. FINDINGS: AP portable upright and supine views of the abdomen and pelvis. Enteric tube remains looped within the stomach. Negative lung bases. Partially visible cardiac pacer leads. No pneumoperitoneum. But continued gas-filled and dilated small bowel loops throughout the mid abdomen up to 46 mm diameter. Paucity of large bowel gas. Small volume retained stool in the rectum. Right nephrolithiasis redemonstrated. Stable visualized osseous structures. IMPRESSION: 1. Ongoing small bowel obstruction pattern, with stable dilated mid abdominal loops up to 46 mm diameter. 2. No free air. 3. Enteric tube within the stomach. Electronically Signed   By: Genevie Ann M.D.   On: 10/22/2020 06:11    Anti-infectives: Anti-infectives (From admission, onward)    None        Assessment/Plan Possible mild enteritis with ileus vs SBO - CT Abd pelvis with mildly dilated loops of bowel in the left hemi-abdomen. There are some decompressed loops of small bowel in RLQ, but not definite transition zone appreciated. Given his history of appendectomy it is possible he has RLQ adhesions that could cause a bowel obstruction. No hernias, masses, or other evidence of mechanical obstruction appreciated. - SBO initiated on 6/17. Xray this am with continued dilated SB. Contrast not visualized in colon - Patient with 2 small bm's overnight and began passing flatus. He reports his abdominal pain has resolved but is still  requiring zofran for nausea. His NGT output is still bilious as well. Will leave on suction today and repeat Xray in the AM. Hopefully he continues to improve.  - Keep K > 4 and Mg > 2 for bowel function - Mobilize for bowel function - Suppository today as appears to have retained stool on CT scan    FEN: NPO, IVF, NG LIWS ID: none VTE: SCD's, lovenox Foley: none Dispo: med-surg, TRH service, SBO protocol   Below per primary service -- AKI on CKD IV COVID-19 infection - not on oxygen HTN HLD GERD Schizoaffective disorder, bipolar type Aneurysm of the infrarenal aorta - 3.2 cm   LOS: 1 day    Jillyn Ledger , [facility] 10/23/2020, 8:26 AM Please see Amion for pager number during day hours 7:00am-4:30pm

## 2020-10-23 NOTE — Progress Notes (Signed)
Pt attempted to have bowel movement on bed side commode. He stated he felt like half may have came out. ONE solid piece the size of a large marble that was blight brown in color was all that was produced. He is positive for flatulence, this was absent yesterday evening. Pt c/o nausea and fatigue. Pt stated "I have never felt this bad". Pt placed in bed to rest.

## 2020-10-23 NOTE — Progress Notes (Signed)
Pt had 1 episode of hematemesis right after shift change. Pt stated that day shift RN came in to restart intermittent suction to his NG tube. Right after she left his room he started vomiting. Amount around 67mL of thin, green fluid. Pt was cleaned up. Will continue to monitor.

## 2020-10-24 ENCOUNTER — Inpatient Hospital Stay (HOSPITAL_COMMUNITY): Payer: PPO

## 2020-10-24 DIAGNOSIS — U071 COVID-19: Secondary | ICD-10-CM | POA: Diagnosis not present

## 2020-10-24 DIAGNOSIS — R609 Edema, unspecified: Secondary | ICD-10-CM

## 2020-10-24 DIAGNOSIS — R7989 Other specified abnormal findings of blood chemistry: Secondary | ICD-10-CM | POA: Diagnosis not present

## 2020-10-24 DIAGNOSIS — K567 Ileus, unspecified: Secondary | ICD-10-CM | POA: Diagnosis not present

## 2020-10-24 LAB — CBC WITH DIFFERENTIAL/PLATELET
Abs Immature Granulocytes: 0.04 10*3/uL (ref 0.00–0.07)
Basophils Absolute: 0 10*3/uL (ref 0.0–0.1)
Basophils Relative: 0 %
Eosinophils Absolute: 0.2 10*3/uL (ref 0.0–0.5)
Eosinophils Relative: 3 %
HCT: 43.3 % (ref 39.0–52.0)
Hemoglobin: 13.8 g/dL (ref 13.0–17.0)
Immature Granulocytes: 1 %
Lymphocytes Relative: 18 %
Lymphs Abs: 1.2 10*3/uL (ref 0.7–4.0)
MCH: 28.5 pg (ref 26.0–34.0)
MCHC: 31.9 g/dL (ref 30.0–36.0)
MCV: 89.5 fL (ref 80.0–100.0)
Monocytes Absolute: 1 10*3/uL (ref 0.1–1.0)
Monocytes Relative: 16 %
Neutro Abs: 4 10*3/uL (ref 1.7–7.7)
Neutrophils Relative %: 62 %
Platelets: 200 10*3/uL (ref 150–400)
RBC: 4.84 MIL/uL (ref 4.22–5.81)
RDW: 13 % (ref 11.5–15.5)
WBC: 6.5 10*3/uL (ref 4.0–10.5)
nRBC: 0 % (ref 0.0–0.2)

## 2020-10-24 LAB — COMPREHENSIVE METABOLIC PANEL
ALT: 12 U/L (ref 0–44)
AST: 20 U/L (ref 15–41)
Albumin: 3.2 g/dL — ABNORMAL LOW (ref 3.5–5.0)
Alkaline Phosphatase: 46 U/L (ref 38–126)
Anion gap: 9 (ref 5–15)
BUN: 26 mg/dL — ABNORMAL HIGH (ref 8–23)
CO2: 25 mmol/L (ref 22–32)
Calcium: 8.7 mg/dL — ABNORMAL LOW (ref 8.9–10.3)
Chloride: 107 mmol/L (ref 98–111)
Creatinine, Ser: 2.17 mg/dL — ABNORMAL HIGH (ref 0.61–1.24)
GFR, Estimated: 34 mL/min — ABNORMAL LOW (ref >60)
Glucose, Bld: 122 mg/dL — ABNORMAL HIGH (ref 70–99)
Potassium: 4.1 mmol/L (ref 3.5–5.1)
Sodium: 141 mmol/L (ref 135–145)
Total Bilirubin: 1 mg/dL (ref 0.3–1.2)
Total Protein: 6.1 g/dL — ABNORMAL LOW (ref 6.5–8.1)

## 2020-10-24 LAB — C-REACTIVE PROTEIN: CRP: 8.2 mg/dL — ABNORMAL HIGH (ref <1.0)

## 2020-10-24 LAB — MAGNESIUM: Magnesium: 2.1 mg/dL (ref 1.7–2.4)

## 2020-10-24 LAB — D-DIMER, QUANTITATIVE: D-Dimer, Quant: 1.27 ug/mL-FEU — ABNORMAL HIGH (ref 0.00–0.50)

## 2020-10-24 MED ORDER — DOCUSATE SODIUM 100 MG PO CAPS
100.0000 mg | ORAL_CAPSULE | Freq: Two times a day (BID) | ORAL | Status: DC
  Administered 2020-10-24 – 2020-10-25 (×3): 100 mg via ORAL
  Filled 2020-10-24 (×3): qty 1

## 2020-10-24 MED ORDER — HYDRALAZINE HCL 50 MG PO TABS
50.0000 mg | ORAL_TABLET | Freq: Three times a day (TID) | ORAL | Status: DC
  Administered 2020-10-24 – 2020-10-25 (×4): 50 mg via ORAL
  Filled 2020-10-24 (×4): qty 1

## 2020-10-24 MED ORDER — CARVEDILOL 6.25 MG PO TABS
6.2500 mg | ORAL_TABLET | Freq: Two times a day (BID) | ORAL | Status: DC
  Administered 2020-10-24 – 2020-10-25 (×3): 6.25 mg via ORAL
  Filled 2020-10-24 (×3): qty 1

## 2020-10-24 MED ORDER — EZETIMIBE 10 MG PO TABS
10.0000 mg | ORAL_TABLET | Freq: Every day | ORAL | Status: DC
  Administered 2020-10-24 – 2020-10-25 (×2): 10 mg via ORAL
  Filled 2020-10-24 (×2): qty 1

## 2020-10-24 MED ORDER — CLONIDINE HCL 0.3 MG/24HR TD PTWK
0.3000 mg | MEDICATED_PATCH | TRANSDERMAL | Status: DC
  Administered 2020-10-24: 0.3 mg via TRANSDERMAL
  Filled 2020-10-24: qty 1

## 2020-10-24 NOTE — Progress Notes (Signed)
Subjective: CC: Patient reports that he has no abdominal pain or nausea since I saw him yesterday. He reports that he had a normal bm yesterday followed by a large episode of diarrhea. He continues to pass flatus with 4 episodes since awakening this morning.   Objective: Vital signs in last 24 hours: Temp:  [98.3 F (36.8 C)-99.8 F (37.7 C)] 99.8 F (37.7 C) (06/19 0809) Pulse Rate:  [73-99] 86 (06/19 0809) Resp:  [15-21] 20 (06/19 0809) BP: (147-192)/(92-108) 188/105 (06/19 0809) SpO2:  [92 %-98 %] 93 % (06/19 0809) Last BM Date: 10/23/20  Intake/Output from previous day: 06/18 0701 - 06/19 0700 In: 337.3 [I.V.:337.3] Out: 800 [Urine:450; Emesis/NG output:350] Intake/Output this shift: No intake/output data recorded.  PE: Gen:  Alert, NAD, pleasant Heart: Tachycardic Pulm:  Normal rate and effort  Abd: Soft with minimal to no distension.  NT. Normoactive bowel sounds.  NGT w/ clear output on LIWS Skin: no rashes noted, warm and dry  Lab Results:  Recent Labs    10/23/20 0223 10/24/20 0548  WBC 5.8 6.5  HGB 15.2 13.8  HCT 46.8 43.3  PLT 202 200   BMET Recent Labs    10/23/20 0223 10/24/20 0548  NA 140 141  K 4.0 4.1  CL 103 107  CO2 26 25  GLUCOSE 112* 122*  BUN 33* 26*  CREATININE 2.78* 2.17*  CALCIUM 9.0 8.7*   PT/INR No results for input(s): LABPROT, INR in the last 72 hours. CMP     Component Value Date/Time   NA 141 10/24/2020 0548   NA 137 09/20/2020 0943   K 4.1 10/24/2020 0548   CL 107 10/24/2020 0548   CO2 25 10/24/2020 0548   GLUCOSE 122 (H) 10/24/2020 0548   BUN 26 (H) 10/24/2020 0548   BUN 19 09/20/2020 0943   CREATININE 2.17 (H) 10/24/2020 0548   CALCIUM 8.7 (L) 10/24/2020 0548   PROT 6.1 (L) 10/24/2020 0548   PROT 6.7 09/20/2020 0943   ALBUMIN 3.2 (L) 10/24/2020 0548   ALBUMIN 4.1 09/20/2020 0943   AST 20 10/24/2020 0548   ALT 12 10/24/2020 0548   ALKPHOS 46 10/24/2020 0548   BILITOT 1.0 10/24/2020 0548   BILITOT  0.3 09/20/2020 0943   GFRNONAA 34 (L) 10/24/2020 0548   GFRAA 40 (L) 12/08/2019 0832   Lipase     Component Value Date/Time   LIPASE 25 10/21/2020 1536       Studies/Results: DG Abd Portable 1V-Small Bowel Obstruction Protocol-24 hr delay  Result Date: 10/23/2020 CLINICAL DATA:  Small bowel obstruction.  24 hour delayed image. EXAM: PORTABLE ABDOMEN - 1 VIEW COMPARISON:  Radiograph earlier today.  CT 10/21/2020 FINDINGS: Enteric tube tip and side-port in the stomach. Similar gaseous distension of small bowel, greatest in the left abdomen measuring 4.6 cm. There is no enteric contrast in the colon. Colonic stool most prominently involving the splenic flexure. No evidence of free air. Stable left pelvic phlebolith. Clear lung bases. IMPRESSION: Similar gaseous distension of small bowel, greatest in the left abdomen. Small volume of colonic stool without enteric contrast in the colon. Electronically Signed   By: Keith Rake M.D.   On: 10/23/2020 18:39   DG Abd Portable 1V-Small Bowel Obstruction Protocol-initial, 8 hr delay  Result Date: 10/23/2020 CLINICAL DATA:  Small-bowel obstruction EXAM: PORTABLE ABDOMEN - 1 VIEW COMPARISON:  10/22/2020 at 5:52 a.m. FINDINGS: Two supine frontal views of the abdomen and pelvis are obtained. Enteric catheter coiled over  the gastric fundus. Diffuse gaseous distention of the small bowel again noted, measuring up to 4.9 cm in diameter. Gas and stool are seen throughout the colon to the level of the rectum. No masses or abnormal calcifications. IMPRESSION: 1. Persistent small bowel obstruction, with stable caliber of the small bowel. 2. Stable enteric catheter. Electronically Signed   By: Randa Ngo M.D.   On: 10/23/2020 02:42    Anti-infectives: Anti-infectives (From admission, onward)    None        Assessment/Plan Possible mild enteritis with ileus vs SBO - CT Abd pelvis with mildly dilated loops of bowel in the left hemi-abdomen. There are  some decompressed loops of small bowel in RLQ, but not definite transition zone appreciated. Given his history of appendectomy it is possible he has RLQ adhesions that could cause a bowel obstruction. No hernias, masses, or other evidence of mechanical obstruction appreciated. - Patient clinically improving. He has resolution of abdominal pain and nausea. NGT output no longer bilious and output decreased. He is NT on exam with normoactive BS. He reports flatus and 2 BM's yesterday. D/c NGT and allow CLD.  -- Keep K > 4 and Mg > 2 for bowel function - Mobilize for bowel function   FEN: D/c NGT. CLD ID: none VTE: SCD's, lovenox Foley: none Dispo: med-surg, TRH service, SBO protocol   Below per primary service -- AKI on CKD IV COVID-19 infection - not on oxygen HTN HLD GERD Schizoaffective disorder, bipolar type Aneurysm of the infrarenal aorta - 3.2 cm   LOS: 2 days    Jillyn Ledger , [facility] 10/24/2020, 8:24 AM Please see Amion for pager number during day hours 7:00am-4:30pm

## 2020-10-24 NOTE — Progress Notes (Signed)
PROGRESS NOTE                                                                                                                                                                                                             Patient Demographics:    Peter Marsh, is a 60 y.o. male, DOB - 1960-08-17, SWN:[id number]  Outpatient Primary MD for the patient is Vevelyn Francois, NP   Admit date - 10/19/2020   LOS - 2  Chief Complaint  Patient presents with   Medical Clearance       Brief Narrative: Patient is a 60 y.o. male with PMHx of schizophrenia, CKD stage IV, HTN, HLD, PAF (not on anticoagulation due to GI bleeding), s/p permanent pacemaker implantation-who was admitted for evaluation of SBO.  He was evaluated on 6/13 at [facility] for suicidal ideation-transferred to [facility] ED on 6/14 as he was COVID-positive.  While at [facility] ED-he developed SBO.  See below for further details.   COVID-19 vaccinated status: Vaccinated-boosted x1  Significant Events: 6/13>> hearing voices-suicidal ideation-evaluated at [facility] 6/14>> transfer to [facility] ED for COVID-positive status 6/16>> while at [facility] ED-cleared by psych for discharge. 6/16>> admit to [facility] for SBO/ileus  Significant studies: 6/14>>Chest x-ray: No pneumonia 6/16>> x-ray abdomen: Distended large and small bowel loops. 6/16>> CT abdomen/pelvis: Enteritis/mild ileus-early obstruction not excluded. 6/17>> x-ray abdomen: NG tube in place-ongoing SBO pattern.  COVID-19 medications: None  Antibiotics: None  Microbiology data: None  Procedures: None  Consults: General surgery  DVT prophylaxis: enoxaparin (LOVENOX) injection 40 mg Start: 10/23/20 2200    Subjective:   Patient in bed, appears comfortable, denies any headache, no fever, no chest pain or pressure, no shortness of breath , no abdominal pain. No new focal weakness.    Assessment  & Plan :   SBO/ileus: he is being  followed by general surgery and this problem is being managed by general surgery.  Treated conservatively with NG tube bowel rest and IV fluids, now on 10/24/2020 having regular bowel activity and BM, NG tube removed by general surgery and placed on clear liquids.  Will monitor.  Requested to stay active and mobile.  AKI on CKD stage IV: AKI hemodynamically mediated-improving with IV fluids.  Appears to have a small distal right ureteral stone (denies any renal colic like symptoms) that probably should pass spontaneously  given its size-will continue to observe closely.  Renal function is improving.  Baseline creatinine around 2.15.  1 episode of hematemesis on day of admission: None so far-hemoglobin stable-monitor on PPI-could have had a small Mallory-Weiss tear.  If hematemesis recurs-we will consult GI.  COVID-19 infection: Diagnosed on 6/13-minimal cough but really no other symptoms-no hypoxemia.  Suspect that reasonable to observe without initiating any treatment at this point.  HTN: BP currently running high on Catapres patch along with oral Coreg added on 10/24/2020.  As needed IV hydralazine.  History of atrial flutter: Reviewed outpatient cardiology note-no anticoagulation due to GI bleed  History of sinus node dysfunction-s/p PPM in place  GERD: Continue PPI  Bipolar disorder/schizoaffective-suicidal ideation: Cleared by psychiatry on 6/16-resume psychotropic medications when oral intake resumes.  Currently not suicidal or homicidal.  Several nonobstructing bilateral renal calculi-5 mm distal right ureteral/right UVJ calculus: Likely to pass spontaneously-per CT-hydronephrosis is similar to prior CT scans.  Stable renal function.  3.2 cm infrarenal aortic aneurysm: Incidental finding-radiology recommends ultrasound every 3 years.  Defer follow-up and monitoring to PCP continue beta-blocker.  Obesity: Estimated body mass index is 28.3 kg/m as calculated from the following:   Height as  of this encounter: 6\' 1"  (1.854 m).   Weight as of this encounter: 97.3 kg.      Condition - Stable  Family Communication  : Called wife Jackelyn Poling [phone] on 10/24/2020 message left at 9:20 AM.  Code Status :  Full Code  Diet :  Diet Order             Diet clear liquid Room service appropriate? Yes; Fluid consistency: Thin  Diet effective now                    Disposition Plan  :   Status is: Observation  The patient will require care spanning > 2 midnights and should be moved to inpatient because: Inpatient level of care appropriate due to severity of illness  Dispo: The patient is from: Home              Anticipated d/c is to: Home              Patient currently is not medically stable to d/c.   Difficult to place patient No     Barriers to discharge: Persistent ileus/SBO-renal function also improving-noted at baseline.  Remains NPO-NGT in place  Antimicorbials  :    Anti-infectives (From admission, onward)    None       Inpatient Medications  Scheduled Meds:  carvedilol  6.25 mg Oral BID WC   cloNIDine  0.3 mg Transdermal Q Sat   docusate sodium  100 mg Oral BID   enoxaparin (LOVENOX) injection  40 mg Subcutaneous Q24H   ezetimibe  10 mg Oral Daily   mometasone-formoterol  2 puff Inhalation BID   And   umeclidinium bromide  1 puff Inhalation Daily   pantoprazole (PROTONIX) IV  40 mg Intravenous Q12H   Continuous Infusions:   PRN Meds:.albuterol, fluticasone, guaiFENesin-dextromethorphan, hydrALAZINE, ondansetron (ZOFRAN) IV   Time Spent in minutes  35   See all Orders from today for further details   Lala Lund M.D on 10/24/2020 at 9:17 AM  To page go to [email or web address] - use universal password  [facility] -  Office  [phone]    Objective:   Vitals:   10/23/20 1948 10/24/20 0600 10/24/20 0700 10/24/20 0809  BP: (!) 190/92 (!) 187/97  (!) 188/105  Pulse: 77 73  86  Resp: (!) 21 15 20 20   Temp: 98.6 F (37 C) 98.3  F (36.8 C) 99.8 F (37.7 C) 99.8 F (37.7 C)  TempSrc: Oral Oral Axillary Axillary  SpO2: 93% 92% 98% 93%  Weight:      Height:        Wt Readings from Last 3 Encounters:  10/22/20 97.3 kg  10/19/20 99.8 kg  09/20/20 103 kg     Intake/Output Summary (Last 24 hours) at 10/24/2020 0917 Last data filed at 10/24/2020 0900 Gross per 24 hour  Intake 1439.5 ml  Output 650 ml  Net 789.5 ml     Physical Exam  Awake Alert, No new F.N deficits, Normal affect [place].AT,PERRAL Supple Neck,No JVD, No cervical lymphadenopathy appriciated.  Symmetrical Chest wall movement, Good air movement bilaterally, CTAB RRR,No Gallops, Rubs or new Murmurs, No Parasternal Heave +ve B.Sounds, Abd Soft, No tenderness, No organomegaly appriciated, No rebound - guarding or rigidity. No Cyanosis, Clubbing or edema, No new Rash or bruise     Data Review:    CBC Recent Labs  Lab 10/19/20 0825 10/21/20 1536 10/22/20 0149 10/23/20 0223 10/24/20 0548  WBC 4.7 9.5 10.6* 5.8 6.5  HGB 13.6 15.6 15.8 15.2 13.8  HCT 42.5 48.6 47.4 46.8 43.3  PLT 165 187 158 202 200  MCV 88.4 88.4 85.7 87.3 89.5  MCH 28.3 28.4 28.6 28.4 28.5  MCHC 32.0 32.1 33.3 32.5 31.9  RDW 12.7 12.8 12.6 13.0 13.0  LYMPHSABS 1.6 1.1 1.2 1.0 1.2  MONOABS 0.6 0.7 0.9 0.9 1.0  EOSABS 0.2 0.1 0.1 0.1 0.2  BASOSABS 0.0 0.0 0.0 0.0 0.0    Chemistries  Recent Labs  Lab 10/19/20 0825 10/21/20 1536 10/22/20 0149 10/23/20 0223 10/24/20 0548  NA 136 137 139 140 141  K 4.4 4.6 4.1 4.0 4.1  CL 107 100 101 103 107  CO2 23 27 25 26 25   GLUCOSE 95 105* 105* 112* 122*  BUN 28* 41* 40* 33* 26*  CREATININE 2.50* 3.25* 3.09* 2.78* 2.17*  CALCIUM 8.8* 9.8 9.8 9.0 8.7*  MG  --   --  1.7 2.2 2.1  AST 21 23 23 20 20   ALT 16 16 16 14 12   ALKPHOS 59 63 58 52 46  BILITOT 0.5 0.9 1.2 1.3* 1.0   ------------------------------------------------------------------------------------------------------------------ No results for input(s): CHOL,  HDL, LDLCALC, TRIG, CHOLHDL, LDLDIRECT in the last 72 hours.  Lab Results  Component Value Date   HGBA1C 5.5 09/08/2019   ------------------------------------------------------------------------------------------------------------------ No results for input(s): TSH, T4TOTAL, T3FREE, THYROIDAB in the last 72 hours.  Invalid input(s): FREET3 ------------------------------------------------------------------------------------------------------------------ No results for input(s): VITAMINB12, FOLATE, FERRITIN, TIBC, IRON, RETICCTPCT in the last 72 hours.  Coagulation profile No results for input(s): INR, PROTIME in the last 168 hours.  Recent Labs    10/23/20 0223 10/24/20 0548  DDIMER 2.65* 1.27*    Cardiac Enzymes No results for input(s): CKMB, TROPONINI, MYOGLOBIN in the last 168 hours.  Invalid input(s): CK ------------------------------------------------------------------------------------------------------------------    Component Value Date/Time   BNP 100.4 (H) 12/08/2019 8144    Micro Results Recent Results (from the past 240 hour(s))  Resp Panel by RT-PCR (Flu A&B, Covid) Nasopharyngeal Swab     Status: Abnormal   Collection Time: 10/18/20 11:43 PM   Specimen: Nasopharyngeal Swab; Nasopharyngeal(NP) swabs in vial transport medium  Result Value Ref Range Status   SARS Coronavirus 2 by RT PCR POSITIVE (A) NEGATIVE Final    Comment: RESULT CALLED TO,  READ BACK BY AND VERIFIED WITH: RN AMY CLLOODSELTER BY MESSAN H. AT 5956 ON 6 14/2022 (NOTE) SARS-CoV-2 target nucleic acids are DETECTED.  The SARS-CoV-2 RNA is generally detectable in upper respiratory specimens during the acute phase of infection. Positive results are indicative of the presence of the identified virus, but do not rule out bacterial infection or co-infection with other pathogens not detected by the test. Clinical correlation with patient history and other diagnostic information is necessary to  determine patient infection status. The expected result is Negative.  Fact Sheet for Patients: EntrepreneurPulse.com.au  Fact Sheet for Healthcare Providers: IncredibleEmployment.be  This test is not yet approved or cleared by the [place] FDA and  has been authorized for detection and/or diagnosis of SARS-CoV-2 by FDA under an Emergency Use Authorization (EUA).  This EUA will remain in effect (mean ing this test can be used) for the duration of  the COVID-19 declaration under Section 564(b)(1) of the Act, 21 U.S.C. section 360bbb-3(b)(1), unless the authorization is terminated or revoked sooner.     Influenza A by PCR NEGATIVE NEGATIVE Final   Influenza B by PCR NEGATIVE NEGATIVE Final    Comment: (NOTE) The Xpert Xpress SARS-CoV-2/FLU/RSV plus assay is intended as an aid in the diagnosis of influenza from Nasopharyngeal swab specimens and should not be used as a sole basis for treatment. Nasal washings and aspirates are unacceptable for Xpert Xpress SARS-CoV-2/FLU/RSV testing.  Fact Sheet for Patients: EntrepreneurPulse.com.au  Fact Sheet for Healthcare Providers: IncredibleEmployment.be  This test is not yet approved or cleared by the [place] FDA and has been authorized for detection and/or diagnosis of SARS-CoV-2 by FDA under an Emergency Use Authorization (EUA). This EUA will remain in effect (meaning this test can be used) for the duration of the COVID-19 declaration under Section 564(b)(1) of the Act, 21 U.S.C. section 360bbb-3(b)(1), unless the authorization is terminated or revoked.  Performed at [facility], [place] [address] [place],  [address]   MRSA PCR Screening     Status: None   Collection Time: 10/23/20 12:23 AM  Result Value Ref Range Status   MRSA by PCR NEGATIVE NEGATIVE Final    Comment:        The GeneXpert MRSA Assay (FDA approved for NASAL  specimens only), is one component of a comprehensive MRSA colonization surveillance program. It is not intended to diagnose MRSA infection nor to guide or monitor treatment for MRSA infections. Performed at [facility], [place] [address] [place],  [address]     Radiology Reports CT Abdomen Pelvis Wo Contrast  Result Date: 10/21/2020 CLINICAL DATA:  60 year old male with abdominal pain. Concern for bowel obstruction. EXAM: CT ABDOMEN AND PELVIS WITHOUT CONTRAST TECHNIQUE: Multidetector CT imaging of the abdomen and pelvis was performed following the standard protocol without IV contrast. COMPARISON:  CT of the abdomen pelvis dated 06/17/2016. FINDINGS: Evaluation of this exam is limited in the absence of intravenous contrast. Lower chest: The visualized lung bases are clear. Cardiac pacemaker wires noted. No intra-abdominal free air or free fluid. Hepatobiliary: The liver is unremarkable. No intrahepatic biliary dilatation. Small gallstone. No pericholecystic fluid or evidence of acute cholecystitis by CT. Pancreas: Scattered pancreatic calcification, sequela of prior pancreatitis. No active inflammatory changes. Spleen: Normal in size without focal abnormality. Adrenals/Urinary Tract: The adrenal glands are unremarkable. Mild left and severe right renal parenchyma atrophy. 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 There is moderate hydronephrosis of the upper pole moiety.  Two adjacent stones noted in the upper pole collecting system each measuring approximately 9 mm and combined length of approximately 15 mm. There is severe hydronephrosis of the inferior pole moiety versus less likely a parapelvic cyst. This is similar to prior CT. A 7 mm calcification noted along the inferior aspect of dilated inferior pole moiety adjacent to the ureteropelvic junction. Several nonobstructing left renal calculi measure up to 9 mm in the inferior pole of the left kidney. No  hydronephrosis on the left. A 4.7 cm hypodense lesion with fluid attenuation arising from the superior pole of the left kidney has increased in size since the prior CT. This likely represent cysts but suboptimally characterized on this noncontrast CT. Ultrasound may provide better evaluation. The visualized ureters appear unremarkable. There is a 5 mm stone in the distal right ureter adjacent to the right ureterovesical junction. Stomach/Bowel: There is moderate stool throughout the colon. Multiple mildly dilated fluid-filled loops of small bowel measure up to 4 cm in the left anterior abdomen. The terminal ileum and distal small bowel demonstrate a normal caliber. No definite transition identified. Although findings may represent enteritis and mild ileus an early obstruction is not excluded. Clinical correlation is recommended. Appendectomy. Vascular/Lymphatic: Moderate aortoiliac atherosclerotic disease. There is a 3.2 cm infrarenal aortic aneurysm, new since the prior CT. The IVC is unremarkable. No portal venous gas. There is no adenopathy. Reproductive: Prostate and seminal vesicles are grossly unremarkable. No pelvic mass. Other: None Musculoskeletal: Degenerative changes of the spine. No acute osseous pathology. IMPRESSION: 1. Probable enteritis with mild ileus. An early obstruction is not excluded. Clinical correlation is recommended. 2. 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 3. Several nonobstructing bilateral renal calculi and a 5 mm distal right ureteral/right UVJ calculus. No hydronephrosis on the left. 4. A 3.2 cm infrarenal aortic aneurysm, new since the prior CT.Recommend follow-up ultrasound every 3 years. This recommendation follows ACR consensus guidelines: White Paper of the ACR Incidental Findings Committee II on Vascular Findings. J Am Coll Radiol 2013; 10:789-794. 5. Cholelithiasis. 6. Aortic Atherosclerosis  (ICD10-I70.0). Electronically Signed   By: Anner Crete M.D.   On: 10/21/2020 18:35   DG Abd 1 View  Result Date: 10/21/2020 CLINICAL DATA:  NG tube placement EXAM: ABDOMEN - 1 VIEW COMPARISON:  Radiograph 10/21/2020, CT 10/21/2020 FINDINGS: Transesophageal tube is coiled in the left upper quadrant with tip and side port distal to the GE junction. Telemetry leads overlie the upper abdomen. Multiple clustered loops of air distended small bowel are again seen in the upper abdomen compatible with known bowel obstruction. No suspicious calcifications. Degenerative changes in the imaged spine and pelvis. IMPRESSION: Transesophageal tube appropriately positioned within the gastric lumen. Persistent small bowel dilatation clustered in the left upper quadrant compatible with known obstruction. Electronically Signed   By: Lovena Le M.D.   On: 10/21/2020 23:33   DG Chest Portable 1 View  Result Date: 10/19/2020 CLINICAL DATA:  COVID. Additional history provided: COVID positive 6/13. Patient reports chest pain, shortness of breath, cough. EXAM: PORTABLE CHEST 1 VIEW COMPARISON:  Prior chest radiographs 07/11/2020 and earlier. FINDINGS: Redemonstrated left chest dual lead implantable cardiac device. Mild cardiomegaly, unchanged. Aortic atherosclerosis. No appreciable airspace consolidation or pulmonary edema. No evidence of pleural effusion or pneumothorax. No acute bony abnormality identified. IMPRESSION: No evidence of acute cardiopulmonary abnormality. Mild cardiomegaly, unchanged. Aortic Atherosclerosis (ICD10-I70.0). Electronically Signed   By: Kellie Simmering DO   On:  10/19/2020 08:46   DG Abd Acute W/Chest  Result Date: 10/21/2020 CLINICAL DATA:  Abdominal pain EXAM: DG ABDOMEN ACUTE WITH 1 VIEW CHEST COMPARISON:  Chest x-ray 11/19/2018 FINDINGS: Heart size and vascularity normal. Lungs clear without infiltrate or effusion. Dual lead pacemaker unchanged. Mildly distended large and small bowel loops  present diffusely. There is gas and stool in the rectum. No free air. IMPRESSION: No acute cardiopulmonary abnormality Mildly distended large and small bowel loops consistent with ileus. Electronically Signed   By: Franchot Gallo M.D.   On: 10/21/2020 14:41   DG Abd Portable 1V-Small Bowel Obstruction Protocol-24 hr delay  Result Date: 10/23/2020 CLINICAL DATA:  Small bowel obstruction.  24 hour delayed image. EXAM: PORTABLE ABDOMEN - 1 VIEW COMPARISON:  Radiograph earlier today.  CT 10/21/2020 FINDINGS: Enteric tube tip and side-port in the stomach. Similar gaseous distension of small bowel, greatest in the left abdomen measuring 4.6 cm. There is no enteric contrast in the colon. Colonic stool most prominently involving the splenic flexure. No evidence of free air. Stable left pelvic phlebolith. Clear lung bases. IMPRESSION: Similar gaseous distension of small bowel, greatest in the left abdomen. Small volume of colonic stool without enteric contrast in the colon. Electronically Signed   By: Keith Rake M.D.   On: 10/23/2020 18:39   DG Abd Portable 1V-Small Bowel Obstruction Protocol-initial, 8 hr delay  Result Date: 10/23/2020 CLINICAL DATA:  Small-bowel obstruction EXAM: PORTABLE ABDOMEN - 1 VIEW COMPARISON:  10/22/2020 at 5:52 a.m. FINDINGS: Two supine frontal views of the abdomen and pelvis are obtained. Enteric catheter coiled over the gastric fundus. Diffuse gaseous distention of the small bowel again noted, measuring up to 4.9 cm in diameter. Gas and stool are seen throughout the colon to the level of the rectum. No masses or abnormal calcifications. IMPRESSION: 1. Persistent small bowel obstruction, with stable caliber of the small bowel. 2. Stable enteric catheter. Electronically Signed   By: Randa Ngo M.D.   On: 10/23/2020 02:42   DG Abd Portable 2V  Result Date: 10/22/2020 CLINICAL DATA:  60 year old male with suspected small bowel obstruction. EXAM: PORTABLE ABDOMEN - 2 VIEW  COMPARISON:  10/21/2020 and earlier. FINDINGS: AP portable upright and supine views of the abdomen and pelvis. Enteric tube remains looped within the stomach. Negative lung bases. Partially visible cardiac pacer leads. No pneumoperitoneum. But continued gas-filled and dilated small bowel loops throughout the mid abdomen up to 46 mm diameter. Paucity of large bowel gas. Small volume retained stool in the rectum. Right nephrolithiasis redemonstrated. Stable visualized osseous structures. IMPRESSION: 1. Ongoing small bowel obstruction pattern, with stable dilated mid abdominal loops up to 46 mm diameter. 2. No free air. 3. Enteric tube within the stomach. Electronically Signed   By: Genevie Ann M.D.   On: 10/22/2020 06:11   CUP PACEART REMOTE DEVICE CHECK  Result Date: 09/28/2020 Scheduled remote reviewed. Normal device function.  RP Next remote 91 days.

## 2020-10-24 NOTE — Progress Notes (Signed)
VASCULAR LAB    Bilateral lower extremity venous duplex has been performed.  See CV proc for preliminary results.   Marven Veley, RVT 10/24/2020, 2:37 PM

## 2020-10-25 DIAGNOSIS — K567 Ileus, unspecified: Secondary | ICD-10-CM | POA: Diagnosis not present

## 2020-10-25 LAB — CBC WITH DIFFERENTIAL/PLATELET
Abs Immature Granulocytes: 0.06 10*3/uL (ref 0.00–0.07)
Basophils Absolute: 0 10*3/uL (ref 0.0–0.1)
Basophils Relative: 0 %
Eosinophils Absolute: 0.2 10*3/uL (ref 0.0–0.5)
Eosinophils Relative: 2 %
HCT: 41.9 % (ref 39.0–52.0)
Hemoglobin: 13.6 g/dL (ref 13.0–17.0)
Immature Granulocytes: 1 %
Lymphocytes Relative: 18 %
Lymphs Abs: 1.3 10*3/uL (ref 0.7–4.0)
MCH: 28.6 pg (ref 26.0–34.0)
MCHC: 32.5 g/dL (ref 30.0–36.0)
MCV: 88.2 fL (ref 80.0–100.0)
Monocytes Absolute: 1.1 10*3/uL — ABNORMAL HIGH (ref 0.1–1.0)
Monocytes Relative: 15 %
Neutro Abs: 4.6 10*3/uL (ref 1.7–7.7)
Neutrophils Relative %: 64 %
Platelets: 175 10*3/uL (ref 150–400)
RBC: 4.75 MIL/uL (ref 4.22–5.81)
RDW: 12.6 % (ref 11.5–15.5)
WBC: 7.2 10*3/uL (ref 4.0–10.5)
nRBC: 0 % (ref 0.0–0.2)

## 2020-10-25 LAB — COMPREHENSIVE METABOLIC PANEL
ALT: 17 U/L (ref 0–44)
AST: 24 U/L (ref 15–41)
Albumin: 3.2 g/dL — ABNORMAL LOW (ref 3.5–5.0)
Alkaline Phosphatase: 45 U/L (ref 38–126)
Anion gap: 9 (ref 5–15)
BUN: 19 mg/dL (ref 8–23)
CO2: 21 mmol/L — ABNORMAL LOW (ref 22–32)
Calcium: 8.7 mg/dL — ABNORMAL LOW (ref 8.9–10.3)
Chloride: 109 mmol/L (ref 98–111)
Creatinine, Ser: 2.07 mg/dL — ABNORMAL HIGH (ref 0.61–1.24)
GFR, Estimated: 36 mL/min — ABNORMAL LOW (ref >60)
Glucose, Bld: 105 mg/dL — ABNORMAL HIGH (ref 70–99)
Potassium: 3.9 mmol/L (ref 3.5–5.1)
Sodium: 139 mmol/L (ref 135–145)
Total Bilirubin: 1.3 mg/dL — ABNORMAL HIGH (ref 0.3–1.2)
Total Protein: 5.9 g/dL — ABNORMAL LOW (ref 6.5–8.1)

## 2020-10-25 LAB — MAGNESIUM: Magnesium: 1.9 mg/dL (ref 1.7–2.4)

## 2020-10-25 LAB — D-DIMER, QUANTITATIVE: D-Dimer, Quant: 1.84 ug/mL-FEU — ABNORMAL HIGH (ref 0.00–0.50)

## 2020-10-25 LAB — C-REACTIVE PROTEIN: CRP: 7.6 mg/dL — ABNORMAL HIGH (ref <1.0)

## 2020-10-25 MED ORDER — ISOSORBIDE DINITRATE 30 MG PO TABS: ORAL_TABLET | ORAL | 1 refills | Status: DC

## 2020-10-25 MED ORDER — DOCUSATE SODIUM 100 MG PO CAPS: 100.0000 mg | ORAL_CAPSULE | Freq: Two times a day (BID) | ORAL | 0 refills | Status: DC

## 2020-10-25 MED ORDER — SUCRALFATE 1 GM/10ML PO SUSP
1.0000 g | Freq: Three times a day (TID) | ORAL | 3 refills | Status: AC
Start: 2020-10-25 — End: ?

## 2020-10-25 MED ORDER — CLONIDINE HCL 0.2 MG PO TABS: 0.2000 mg | ORAL_TABLET | Freq: Three times a day (TID) | ORAL | 0 refills | Status: DC

## 2020-10-25 MED ORDER — ONDANSETRON 4 MG PO TBDP: 4.0000 mg | ORAL_TABLET | Freq: Three times a day (TID) | ORAL | 0 refills | Status: DC | PRN

## 2020-10-25 MED ORDER — FLUTICASONE PROPIONATE 50 MCG/ACT NA SUSP
1.0000 | Freq: Every day | NASAL | Status: DC | PRN
  Filled 2020-10-25: qty 16

## 2020-10-25 MED ORDER — CLONIDINE HCL 0.2 MG PO TABS
0.2000 mg | ORAL_TABLET | Freq: Three times a day (TID) | ORAL | Status: DC
  Administered 2020-10-25: 0.2 mg via ORAL
  Filled 2020-10-25: qty 1

## 2020-10-25 NOTE — Progress Notes (Signed)
Patient was discharged from [phone]. AVS reviewed and all questions answered. Printed prescriptions given to patient. IV's d/c'd, skin intact. VS stable. Patient belongings include wallet and glasses.

## 2020-10-25 NOTE — Progress Notes (Signed)
Subjective: CC: Doing well.  No abdominal pain.  Tolerating liquids.  Passing flatus and having BMs  Objective: Vital signs in last 24 hours: Temp:  [98.3 F (36.8 C)-99 F (37.2 C)] 98.8 F (37.1 C) (06/20 0756) Pulse Rate:  [81-120] 86 (06/20 0756) Resp:  [17-20] 19 (06/20 0756) BP: (129-191)/(80-139) 181/102 (06/20 0756) SpO2:  [91 %-96 %] 96 % (06/20 0756) Last BM Date: 10/24/20  Intake/Output from previous day: 06/19 0701 - 06/20 0700 In: 1822.2 [P.O.:780; I.V.:1042.2] Out: 200 [Emesis/NG output:200] Intake/Output this shift: No intake/output data recorded.  PE: Heart: regular Pulm:  CTAB Abd: Soft, no distension.  NT. Normoactive bowel sounds.     Lab Results:  Recent Labs    10/24/20 0548 10/25/20 0302  WBC 6.5 7.2  HGB 13.8 13.6  HCT 43.3 41.9  PLT 200 175   BMET Recent Labs    10/24/20 0548 10/25/20 0302  NA 141 139  K 4.1 3.9  CL 107 109  CO2 25 21*  GLUCOSE 122* 105*  BUN 26* 19  CREATININE 2.17* 2.07*  CALCIUM 8.7* 8.7*   PT/INR No results for input(s): LABPROT, INR in the last 72 hours. CMP     Component Value Date/Time   NA 139 10/25/2020 0302   NA 137 09/20/2020 0943   K 3.9 10/25/2020 0302   CL 109 10/25/2020 0302   CO2 21 (L) 10/25/2020 0302   GLUCOSE 105 (H) 10/25/2020 0302   BUN 19 10/25/2020 0302   BUN 19 09/20/2020 0943   CREATININE 2.07 (H) 10/25/2020 0302   CALCIUM 8.7 (L) 10/25/2020 0302   PROT 5.9 (L) 10/25/2020 0302   PROT 6.7 09/20/2020 0943   ALBUMIN 3.2 (L) 10/25/2020 0302   ALBUMIN 4.1 09/20/2020 0943   AST 24 10/25/2020 0302   ALT 17 10/25/2020 0302   ALKPHOS 45 10/25/2020 0302   BILITOT 1.3 (H) 10/25/2020 0302   BILITOT 0.3 09/20/2020 0943   GFRNONAA 36 (L) 10/25/2020 0302   GFRAA 40 (L) 12/08/2019 0832   Lipase     Component Value Date/Time   LIPASE 25 10/21/2020 1536       Studies/Results: DG Abd Portable 1V  Result Date: 10/24/2020 CLINICAL DATA:  Abdominal distension. EXAM:  PORTABLE ABDOMEN - 1 VIEW COMPARISON:  10/23/2020. FINDINGS: Enteric tube tip and side-port project over the stomach. Multiple gaseous distended loops of bowel are demonstrated throughout the abdomen. Overall these appear slightly decreased when compared to recent prior exam. Left basilar atelectasis. Lumbar spine degenerative changes. IMPRESSION: Findings suggestive of mild improvement in gaseous distended loops of bowel within the abdomen. Electronically Signed   By: Lovey Newcomer M.D.   On: 10/24/2020 11:55   DG Abd Portable 1V-Small Bowel Obstruction Protocol-24 hr delay  Result Date: 10/23/2020 CLINICAL DATA:  Small bowel obstruction.  24 hour delayed image. EXAM: PORTABLE ABDOMEN - 1 VIEW COMPARISON:  Radiograph earlier today.  CT 10/21/2020 FINDINGS: Enteric tube tip and side-port in the stomach. Similar gaseous distension of small bowel, greatest in the left abdomen measuring 4.6 cm. There is no enteric contrast in the colon. Colonic stool most prominently involving the splenic flexure. No evidence of free air. Stable left pelvic phlebolith. Clear lung bases. IMPRESSION: Similar gaseous distension of small bowel, greatest in the left abdomen. Small volume of colonic stool without enteric contrast in the colon. Electronically Signed   By: Keith Rake M.D.   On: 10/23/2020 18:39   VAS [place] LOWER EXTREMITY VENOUS (DVT)  Result Date: 10/24/2020  Lower Venous DVT Study Patient Name:  Peter Marsh  Date of Exam:   10/24/2020 Medical Rec #: [id number]        Accession #:    [id number] Date of Birth: [birth year]        Patient Gender: M Patient Age:   59Y Exam Location:  [facility] Procedure:      VAS [place] LOWER EXTREMITY VENOUS (DVT) Referring Phys: [id number] [facility] --------------------------------------------------------------------------------  Indications: Covid, edema.  Comparison Study: Prior negative bilateral LEV done 10/15/2017 Performing Technologist: Sharion Dove RVS  Examination  Guidelines: A complete evaluation includes B-mode imaging, spectral Doppler, color Doppler, and power Doppler as needed of all accessible portions of each vessel. Bilateral testing is considered an integral part of a complete examination. Limited examinations for reoccurring indications may be performed as noted. The reflux portion of the exam is performed with the patient in reverse Trendelenburg.  +---------+---------------+---------+-----------+----------+--------------+ RIGHT    CompressibilityPhasicitySpontaneityPropertiesThrombus Aging +---------+---------------+---------+-----------+----------+--------------+ CFV      Full           Yes      Yes                                 +---------+---------------+---------+-----------+----------+--------------+ SFJ      Full                                                        +---------+---------------+---------+-----------+----------+--------------+ FV Prox  Full                                                        +---------+---------------+---------+-----------+----------+--------------+ FV Mid   Full                                                        +---------+---------------+---------+-----------+----------+--------------+ FV DistalFull                                                        +---------+---------------+---------+-----------+----------+--------------+ PFV      Full                                                        +---------+---------------+---------+-----------+----------+--------------+ POP      Full           Yes      Yes                                 +---------+---------------+---------+-----------+----------+--------------+ PTV      Full                                                        +---------+---------------+---------+-----------+----------+--------------+  PERO     Full                                                         +---------+---------------+---------+-----------+----------+--------------+   +---------+---------------+---------+-----------+----------+--------------+ LEFT     CompressibilityPhasicitySpontaneityPropertiesThrombus Aging +---------+---------------+---------+-----------+----------+--------------+ CFV      Full           Yes      Yes                                 +---------+---------------+---------+-----------+----------+--------------+ SFJ      Full                                                        +---------+---------------+---------+-----------+----------+--------------+ FV Prox  Full                                                        +---------+---------------+---------+-----------+----------+--------------+ FV Mid   Full                                                        +---------+---------------+---------+-----------+----------+--------------+ FV DistalFull                                                        +---------+---------------+---------+-----------+----------+--------------+ PFV      Full                                                        +---------+---------------+---------+-----------+----------+--------------+ POP      Full           Yes      Yes                                 +---------+---------------+---------+-----------+----------+--------------+ PTV      Full                                                        +---------+---------------+---------+-----------+----------+--------------+ PERO     Full                                                        +---------+---------------+---------+-----------+----------+--------------+  Summary: BILATERAL: - No evidence of deep vein thrombosis seen in the lower extremities, bilaterally. -   *See table(s) above for measurements and observations. Electronically signed by Harold Barban MD on 10/24/2020 at 9:31:22 PM.    Final      Anti-infectives: Anti-infectives (From admission, onward)    None        Assessment/Plan Possible mild enteritis with ileus vs SBO -doing well tolerating CLd -agree with soft diet and home today   FEN: soft ID: none VTE: SCD's, lovenox Foley: none Dispo: home   Below per primary service -- AKI on CKD IV COVID-19 infection - not on oxygen HTN HLD GERD Schizoaffective disorder, bipolar type Aneurysm of the infrarenal aorta - 3.2 cm   LOS: 3 days    Henreitta Cea , [facility] Surgery 10/25/2020, 10:35 AM Please see Amion for pager number during day hours 7:00am-4:30pm

## 2020-10-25 NOTE — Progress Notes (Signed)
Have retrieved the patient's wallet with license, medicare card, and debit card from the security office. Per patient, he also had clothes and shoes in the ED. No clothes or shoes were in security's possession. I have reminded the patient that we are not responsible for belongings brought to the hospital, including his clothing and shoes. Have provided support to patient.

## 2020-10-25 NOTE — Progress Notes (Signed)
PT Cancellation Note  Patient Details Name: Peter Marsh MRN: [id number] DOB: 1961-01-18   Cancelled Treatment:    Reason Eval/Treat Not Completed: Other (comment) patient currently being discharged. Thank you for opportunity to participate in his care!   Windell Norfolk, DPT, PN1   Supplemental Physical Therapist [facility]    Pager [phone] Acute Rehab Office [phone]

## 2020-10-25 NOTE — Discharge Instructions (Addendum)
Follow with Primary MD Vevelyn Francois, NP in 7 days, follow-up with your psychiatrist within a week.  Get CBC, CMP, 2 view Chest X ray -  checked next visit within 1 week by Primary MD   Activity: As tolerated with Full fall precautions use walker/cane & assistance as needed  Disposition Home    Diet: Soft consistency for the next 3 to 4 days then gradually advance to regular consistency heart healthy diet as tolerated.  Special Instructions: If you have smoked or chewed Tobacco  in the last 2 yrs please stop smoking, stop any regular Alcohol  and or any Recreational drug use.  On your next visit with your primary care physician please Get Medicines reviewed and adjusted.  Please request your Prim.MD to go over all Hospital Tests and Procedure/Radiological results at the follow up, please get all Hospital records sent to your Prim MD by signing hospital release before you go home.  If you experience worsening of your admission symptoms, develop shortness of breath, life threatening emergency, suicidal or homicidal thoughts you must seek medical attention immediately by calling 911 or calling your MD immediately  if symptoms less severe.  You Must read complete instructions/literature along with all the possible adverse reactions/side effects for all the Medicines you take and that have been prescribed to you. Take any new Medicines after you have completely understood and accpet all the possible adverse reactions/side effects.       Person Under Monitoring Name: Peter Marsh  Location: [address] [place] [address]   Infection Prevention Recommendations for Individuals Confirmed to have, or Being Evaluated for, 2019 Novel Coronavirus (COVID-19) Infection Who Receive Care at Home  Individuals who are confirmed to have, or are being evaluated for, COVID-19 should follow the prevention steps below until a healthcare provider or local or state health department  says they can return to normal activities.  Stay home except to get medical care You should restrict activities outside your home, except for getting medical care. Do not go to work, school, or public areas, and do not use public transportation or taxis.  Call ahead before visiting your doctor Before your medical appointment, call the healthcare provider and tell them that you have, or are being evaluated for, COVID-19 infection. This will help the healthcare provider's office take steps to keep other people from getting infected. Ask your healthcare provider to call the local or state health department.  Monitor your symptoms Seek prompt medical attention if your illness is worsening (e.g., difficulty breathing). Before going to your medical appointment, call the healthcare provider and tell them that you have, or are being evaluated for, COVID-19 infection. Ask your healthcare provider to call the local or state health department.  Wear a facemask You should wear a facemask that covers your nose and mouth when you are in the same room with other people and when you visit a healthcare provider. People who live with or visit you should also wear a facemask while they are in the same room with you.  Separate yourself from other people in your home As much as possible, you should stay in a different room from other people in your home. Also, you should use a separate bathroom, if available.  Avoid sharing household items You should not share dishes, drinking glasses, cups, eating utensils, towels, bedding, or other items with other people in your home. After using these items, you should wash them thoroughly with soap and water.  Cover your coughs and sneezes Cover your mouth and nose with a tissue when you cough or sneeze, or you can cough or sneeze into your sleeve. Throw used tissues in a lined trash can, and immediately wash your hands with soap and water for at least 20 seconds or  use an alcohol-based hand rub.  Wash your [facility] your hands often and thoroughly with soap and water for at least 20 seconds. You can use an alcohol-based hand sanitizer if soap and water are not available and if your hands are not visibly dirty. Avoid touching your eyes, nose, and mouth with unwashed hands.   Prevention Steps for Caregivers and Household Members of Individuals Confirmed to have, or Being Evaluated for, COVID-19 Infection Being Cared for in the Home  If you live with, or provide care at home for, a person confirmed to have, or being evaluated for, COVID-19 infection please follow these guidelines to prevent infection:  Follow healthcare provider's instructions Make sure that you understand and can help the patient follow any healthcare provider instructions for all care.  Provide for the patient's basic needs You should help the patient with basic needs in the home and provide support for getting groceries, prescriptions, and other personal needs.  Monitor the patient's symptoms If they are getting sicker, call his or her medical provider and tell them that the patient has, or is being evaluated for, COVID-19 infection. This will help the healthcare provider's office take steps to keep other people from getting infected. Ask the healthcare provider to call the local or state health department.  Limit the number of people who have contact with the patient If possible, have only one caregiver for the patient. Other household members should stay in another home or place of residence. If this is not possible, they should stay in another room, or be separated from the patient as much as possible. Use a separate bathroom, if available. Restrict visitors who do not have an essential need to be in the home.  Keep older adults, very young children, and other sick people away from the patient Keep older adults, very young children, and those who have compromised immune  systems or chronic health conditions away from the patient. This includes people with chronic heart, lung, or kidney conditions, diabetes, and cancer.  Ensure good ventilation Make sure that shared spaces in the home have good air flow, such as from an air conditioner or an opened window, weather permitting.  Wash your hands often Wash your hands often and thoroughly with soap and water for at least 20 seconds. You can use an alcohol based hand sanitizer if soap and water are not available and if your hands are not visibly dirty. Avoid touching your eyes, nose, and mouth with unwashed hands. Use disposable paper towels to dry your hands. If not available, use dedicated cloth towels and replace them when they become wet.  Wear a facemask and gloves Wear a disposable facemask at all times in the room and gloves when you touch or have contact with the patient's blood, body fluids, and/or secretions or excretions, such as sweat, saliva, sputum, nasal mucus, vomit, urine, or feces.  Ensure the mask fits over your nose and mouth tightly, and do not touch it during use. Throw out disposable facemasks and gloves after using them. Do not reuse. Wash your hands immediately after removing your facemask and gloves. If your personal clothing becomes contaminated, carefully remove clothing and launder. Wash your hands after handling  contaminated clothing. Place all used disposable facemasks, gloves, and other waste in a lined container before disposing them with other household waste. Remove gloves and wash your hands immediately after handling these items.  Do not share dishes, glasses, or other household items with the patient Avoid sharing household items. You should not share dishes, drinking glasses, cups, eating utensils, towels, bedding, or other items with a patient who is confirmed to have, or being evaluated for, COVID-19 infection. After the person uses these items, you should wash them thoroughly  with soap and water.  Wash laundry thoroughly Immediately remove and wash clothes or bedding that have blood, body fluids, and/or secretions or excretions, such as sweat, saliva, sputum, nasal mucus, vomit, urine, or feces, on them. Wear gloves when handling laundry from the patient. Read and follow directions on labels of laundry or clothing items and detergent. In general, wash and dry with the warmest temperatures recommended on the label.  Clean all areas the individual has used often Clean all touchable surfaces, such as counters, tabletops, doorknobs, bathroom fixtures, toilets, phones, keyboards, tablets, and bedside tables, every day. Also, clean any surfaces that may have blood, body fluids, and/or secretions or excretions on them. Wear gloves when cleaning surfaces the patient has come in contact with. Use a diluted bleach solution (e.g., dilute bleach with 1 part bleach and 10 parts water) or a household disinfectant with a label that says EPA-registered for coronaviruses. To make a bleach solution at home, add 1 tablespoon of bleach to 1 quart (4 cups) of water. For a larger supply, add  cup of bleach to 1 gallon (16 cups) of water. Read labels of cleaning products and follow recommendations provided on product labels. Labels contain instructions for safe and effective use of the cleaning product including precautions you should take when applying the product, such as wearing gloves or eye protection and making sure you have good ventilation during use of the product. Remove gloves and wash hands immediately after cleaning.  Monitor yourself for signs and symptoms of illness Caregivers and household members are considered close contacts, should monitor their health, and will be asked to limit movement outside of the home to the extent possible. Follow the monitoring steps for close contacts listed on the symptom monitoring form.   ? If you have additional questions, contact your  local health department or call the epidemiologist on call at [phone] (available 24/7). ? This guidance is subject to change. For the most up-to-date guidance from [facility], please refer to their website: [email or web address]

## 2020-10-25 NOTE — Discharge Summary (Signed)
Peter Marsh:[id number] DOB: Jan 23, 1961 DOA: 10/19/2020  PCP: Vevelyn Francois, NP  Admit date: 10/19/2020  Discharge date: 10/25/2020  Admitted From: Home  Disposition:  Home   Recommendations for Outpatient Follow-up:   Follow up with PCP in 1-2 weeks  PCP Please obtain BMP/CBC, 2 view CXR in 1week,  (see Discharge instructions)   PCP Please follow up on the following pending results: Needs outpatient psych, vascular surgery follow-up.  Check CT abdomen pelvis noncontrasted 1 week to reevaluate right ureteral stone.  Also needs outpatient GI follow-up for surveillance EGD.   Home Health: None   Equipment/Devices: None  Consultations: Psych, CCS Discharge Condition: Stable    CODE STATUS: Full    Diet Recommendation: Soft diet for the next 5 days and advance as tolerated to regular consistency heart healthy diet.  Diet Order             DIET SOFT Room service appropriate? Yes; Fluid consistency: Thin  Diet effective now                    Chief Complaint  Patient presents with   Medical Clearance     Brief history of present illness from the day of admission and additional interim summary    Patient is a 60 y.o. male with PMHx of schizophrenia, CKD stage IV, HTN, HLD, PAF (not on anticoagulation due to GI bleeding), s/p permanent pacemaker implantation-who was admitted for evaluation of SBO.  He was evaluated on 6/13 at [facility] for suicidal ideation-transferred to [facility] ED on 6/14 as he was COVID-positive.  While at [facility] ED-he developed SBO.  See below for further details.     COVID-19 vaccinated status: Vaccinated-boosted x1   Significant Events: 6/13>> hearing voices-suicidal ideation-evaluated at [facility] 6/14>> transfer to [facility] ED for COVID-positive status 6/16>> while at [facility] ED-cleared by psych  for discharge. 6/16>> admit to [facility] for SBO/ileus                                                                   Hospital Course    SBO/ileus: he is being followed by general surgery and this problem is being managed by general surgery.  Treated conservatively with NG tube bowel rest and IV fluids, now on 10/24/2020 having regular bowel activity and BM, NG tube removed by general surgery, he was placed on clear liquids thereafter soft diet which he tolerated well continue to have bowel movements.  Symptom-free will be discharged home today with outpatient PCP follow-up.   AKI on CKD stage IV: AKI hemodynamically mediated-improving with IV fluids.  Appears to have a small distal right ureteral stone (denies any renal colic like symptoms) that probably should pass spontaneously given its size-will continue to observe closely.  Function has improved and creatinine  is now better than baseline, request PCP to repeat CT scan abdomen pelvis noncontrast in 7 to 10 days to reevaluate the stone.   1 episode of hematemesis on day of admission: None so far-hemoglobin stable-monitor on PPI-could have had a small Mallory-Weiss tear.  No further reoccurrence of this problem is already on PPI and Carafate at home which will be continued.  PCP may consider referring him to GI in 4 weeks outpatient.   COVID-19 infection: Diagnosed on 6/13-minimal cough but really no other symptoms-no hypoxemia.  Suspect that reasonable to observe without initiating any treatment at this point.   HTN: Continue home regimen Catapres dose increased for better control.   History of atrial flutter: Reviewed outpatient cardiology note-no anticoagulation due to GI bleed   History of sinus node dysfunction-s/p PPM in place   GERD: Continue PPI   Bipolar disorder/schizoaffective-suicidal ideation: Cleared by psychiatry on 6/16-resume psychotropic medications when oral intake resumes.  Currently not suicidal or homicidal.   Several  nonobstructing bilateral renal calculi-5 mm distal right ureteral/right UVJ calculus: Likely to pass spontaneously-per CT-hydronephrosis is similar to prior CT scans.  Stable renal function.  Currently see above requested PCP to recheck CT scan abdomen pelvis noncontrast in 7 to 10 days.   3.2 cm infrarenal aortic aneurysm: Incidental finding-radiology recommends ultrasound every 3 years.  Defer follow-up and monitoring to PCP continue beta-blocker.   Obesity: Estimated body mass index is 28.3 kg/m as calculated from the following:   Height as of this encounter: 6\' 1"  (1.854 m).   Weight as of this encounter: 97.3 kg.   Discharge diagnosis     Principal Problem:   Ileus ([place]) Active Problems:   Hematemesis   Essential hypertension   GERD (gastroesophageal reflux disease)   Schizoaffective disorder, bipolar type ([name])   Acute renal failure superimposed on stage 4 chronic kidney disease ([place])   COVID-19 virus infection   Aneurysm of infrarenal abdominal aorta ([name])   SBO (small bowel obstruction) [facility])    Discharge instructions    Discharge Instructions     Discharge instructions   Complete by: As directed    Follow with Primary MD Vevelyn Francois, NP in 7 days, follow-up with your psychiatrist within a week.  Get CBC, CMP, 2 view Chest X ray -  checked next visit within 1 week by Primary MD   Activity: As tolerated with Full fall precautions use walker/cane & assistance as needed  Disposition Home    Diet: Soft consistency for the next 3 to 4 days then gradually advance to regular consistency heart healthy diet as tolerated.  Special Instructions: If you have smoked or chewed Tobacco  in the last 2 yrs please stop smoking, stop any regular Alcohol  and or any Recreational drug use.  On your next visit with your primary care physician please Get Medicines reviewed and adjusted.  Please request your Prim.MD to go over all Hospital Tests and Procedure/Radiological results  at the follow up, please get all Hospital records sent to your Prim MD by signing hospital release before you go home.  If you experience worsening of your admission symptoms, develop shortness of breath, life threatening emergency, suicidal or homicidal thoughts you must seek medical attention immediately by calling 911 or calling your MD immediately  if symptoms less severe.  You Must read complete instructions/literature along with all the possible adverse reactions/side effects for all the Medicines you take and that have been prescribed to you. Take any new Medicines after  you have completely understood and accpet all the possible adverse reactions/side effects.   Increase activity slowly   Complete by: As directed    MyChart COVID-19 home monitoring program   Complete by: Oct 25, 2020    Is the patient willing to use the [facility] for home monitoring?: Yes   Temperature monitoring   Complete by: Oct 25, 2020    After how many days would you like to receive a notification of this patient's flowsheet entries?: 1       Discharge Medications   Allergies as of 10/25/2020       Reactions   Methylpyrrolidone Hives   froze the intestine   Niacin Itching, Nausea And Vomiting, Other (See Comments)   Flushing, itching, tingling    Ace Inhibitors    Other reaction(s): CKD 4   Norvasc [amlodipine Besylate] Other (See Comments)   Swollen Feet   Other Other (See Comments)   Oxybutynin Chloride [oxybutynin Chloride Er] Other (See Comments)   froze the intestine   Vesicare [solifenacin Succinate] Other (See Comments)   Froze the intestine   Ciprofloxacin Rash, Other (See Comments)   Felt flushed   Oxybutynin Rash, Other (See Comments)   bowel obst   Solifenacin Rash        Medication List     STOP taking these medications    benazepril 10 MG tablet Commonly known as: LOTENSIN   tamsulosin 0.4 MG Caps capsule Commonly known as: FLOMAX       TAKE these medications     acetaminophen 325 MG tablet Commonly known as: TYLENOL Take 650 mg by mouth every 6 (six) hours as needed for mild pain or headache.   albuterol 108 (90 Base) MCG/ACT inhaler Commonly known as: VENTOLIN HFA Inhale 2 puffs into the lungs every 6 (six) hours as needed for wheezing or shortness of breath.   Breztri Aerosphere 160-9-4.8 MCG/ACT Aero Generic drug: Budeson-Glycopyrrol-Formoterol 2 puffs twice a day with a spacer What changed:  how much to take how to take this when to take this additional instructions   buPROPion 150 MG 12 hr tablet Commonly known as: WELLBUTRIN SR Take 150 mg by mouth 2 (two) times daily.   cloNIDine 0.2 MG tablet Commonly known as: CATAPRES Take 1 tablet (0.2 mg total) by mouth 3 (three) times daily. Please keep upcoming appt with Dr. Lovena Le in July 2022 before anymore refills. Thank you What changed: when to take this   docusate sodium 100 MG capsule Commonly known as: COLACE Take 1 capsule (100 mg total) by mouth 2 (two) times daily.   ezetimibe 10 MG tablet Commonly known as: ZETIA Take 1 tablet (10 mg total) by mouth daily.   Fenofibrate 50 MG Caps Take 1 capsule (50 mg total) by mouth daily.   fluticasone 50 MCG/ACT nasal spray Commonly known as: FLONASE Place 1 spray into both nostrils daily as needed for allergies.   furosemide 20 MG tablet Commonly known as: LASIX Take 1 tablet (20 mg total) by mouth daily.   gabapentin 100 MG capsule Commonly known as: Neurontin Take 1 capsule (100 mg total) by mouth 3 (three) times daily.   hydrALAZINE 50 MG tablet Commonly known as: APRESOLINE Take 1 tablet (50 mg total) by mouth 3 (three) times daily.   isosorbide dinitrate 30 MG tablet Commonly known as: ISORDIL TAKE 1 TABLET(30 MG) BY MOUTH FOUR TIMES DAILY What changed:  how much to take how to take this when to take this additional  instructions   metoprolol tartrate 100 MG tablet Commonly known as: LOPRESSOR Take 1  tablet (100 mg total) by mouth 2 (two) times daily.   montelukast 10 MG tablet Commonly known as: SINGULAIR Take 1 tablet (10 mg total) by mouth at bedtime.   ondansetron 4 MG disintegrating tablet Commonly known as: Zofran ODT Take 1 tablet (4 mg total) by mouth every 8 (eight) hours as needed for nausea or vomiting.   paliperidone 6 MG 24 hr tablet Commonly known as: INVEGA Take 6 mg by mouth at bedtime.   pantoprazole 40 MG tablet Commonly known as: PROTONIX Take 1 tablet (40 mg total) by mouth 2 (two) times daily.   sucralfate 1 GM/10ML suspension Commonly known as: Carafate Take 10 mLs (1 g total) by mouth 4 (four) times daily -  with meals and at bedtime. What changed:  when to take this reasons to take this   VITAMIN B-12 PO Take 1 tablet by mouth daily.   Zenpep 40000-126000 units Cpep Generic drug: Pancrelipase (Lip-Prot-Amyl) Take 1 tablet by mouth with breakfast, with lunch, and with evening meal.         Follow-up Information     Vevelyn Francois, NP. Schedule an appointment as soon as possible for a visit in 1 week(s).   Specialty: Adult Health Nurse Practitioner Why: Also follow-up with your psychiatrist within a week or request your PCP to arrange for a psychiatrist follow-up within 7 to 10 days. Contact information: [address] [place] [address] [phone]         Evans Lance, MD .   Specialty: Cardiology Contact information: [phone] [address] [place] [address] [place] [address] [phone]         Croitoru, Dani Gobble, MD. Schedule an appointment as soon as possible for a visit in 1 week(s).   Specialty: Cardiology Contact information: [address] [place] [address] [phone]         Waynetta Sandy, MD. Schedule an appointment as soon as possible for a visit in 1 month(s).   Specialties: Vascular Surgery, Cardiology Why: Aortic aneurysm follow-up and monitoring Contact  information: [place] [address] [phone]                 Major procedures and Radiology Reports - PLEASE review detailed and final reports thoroughly  -       CT Abdomen Pelvis Wo Contrast  Result Date: 10/21/2020 CLINICAL DATA:  60 year old male with abdominal pain. Concern for bowel obstruction. EXAM: CT ABDOMEN AND PELVIS WITHOUT CONTRAST TECHNIQUE: Multidetector CT imaging of the abdomen and pelvis was performed following the standard protocol without IV contrast. COMPARISON:  CT of the abdomen pelvis dated 06/17/2016. FINDINGS: Evaluation of this exam is limited in the absence of intravenous contrast. Lower chest: The visualized lung bases are clear. Cardiac pacemaker wires noted. No intra-abdominal free air or free fluid. Hepatobiliary: The liver is unremarkable. No intrahepatic biliary dilatation. Small gallstone. No pericholecystic fluid or evidence of acute cholecystitis by CT. Pancreas: Scattered pancreatic calcification, sequela of prior pancreatitis. No active inflammatory changes. Spleen: Normal in size without focal abnormality. Adrenals/Urinary Tract: The adrenal glands are unremarkable. Mild left and severe right renal parenchyma atrophy. 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 There is moderate hydronephrosis of the upper pole moiety. Two adjacent stones noted in the upper pole collecting system each measuring approximately 9 mm and combined length of approximately 15 mm. There is  severe hydronephrosis of the inferior pole moiety versus less likely a parapelvic cyst. This is similar to prior CT. A 7 mm calcification noted along the inferior aspect of dilated inferior pole moiety adjacent to the ureteropelvic junction. Several nonobstructing left renal calculi measure up to 9 mm in the inferior pole of the left kidney. No hydronephrosis on the left. A 4.7 cm hypodense lesion with fluid attenuation arising from the superior pole of  the left kidney has increased in size since the prior CT. This likely represent cysts but suboptimally characterized on this noncontrast CT. Ultrasound may provide better evaluation. The visualized ureters appear unremarkable. There is a 5 mm stone in the distal right ureter adjacent to the right ureterovesical junction. Stomach/Bowel: There is moderate stool throughout the colon. Multiple mildly dilated fluid-filled loops of small bowel measure up to 4 cm in the left anterior abdomen. The terminal ileum and distal small bowel demonstrate a normal caliber. No definite transition identified. Although findings may represent enteritis and mild ileus an early obstruction is not excluded. Clinical correlation is recommended. Appendectomy. Vascular/Lymphatic: Moderate aortoiliac atherosclerotic disease. There is a 3.2 cm infrarenal aortic aneurysm, new since the prior CT. The IVC is unremarkable. No portal venous gas. There is no adenopathy. Reproductive: Prostate and seminal vesicles are grossly unremarkable. No pelvic mass. Other: None Musculoskeletal: Degenerative changes of the spine. No acute osseous pathology. IMPRESSION: 1. Probable enteritis with mild ileus. An early obstruction is not excluded. Clinical correlation is recommended. 2. 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 3. Several nonobstructing bilateral renal calculi and a 5 mm distal right ureteral/right UVJ calculus. No hydronephrosis on the left. 4. A 3.2 cm infrarenal aortic aneurysm, new since the prior CT.Recommend follow-up ultrasound every 3 years. This recommendation follows ACR consensus guidelines: White Paper of the ACR Incidental Findings Committee II on Vascular Findings. J Am Coll Radiol 2013; 10:789-794. 5. Cholelithiasis. 6. Aortic Atherosclerosis (ICD10-I70.0). Electronically Signed   By: Anner Crete M.D.   On: 10/21/2020 18:35   DG Abd 1 View  Result Date:  10/21/2020 CLINICAL DATA:  NG tube placement EXAM: ABDOMEN - 1 VIEW COMPARISON:  Radiograph 10/21/2020, CT 10/21/2020 FINDINGS: Transesophageal tube is coiled in the left upper quadrant with tip and side port distal to the GE junction. Telemetry leads overlie the upper abdomen. Multiple clustered loops of air distended small bowel are again seen in the upper abdomen compatible with known bowel obstruction. No suspicious calcifications. Degenerative changes in the imaged spine and pelvis. IMPRESSION: Transesophageal tube appropriately positioned within the gastric lumen. Persistent small bowel dilatation clustered in the left upper quadrant compatible with known obstruction. Electronically Signed   By: Lovena Le M.D.   On: 10/21/2020 23:33   DG Chest Portable 1 View  Result Date: 10/19/2020 CLINICAL DATA:  COVID. Additional history provided: COVID positive 6/13. Patient reports chest pain, shortness of breath, cough. EXAM: PORTABLE CHEST 1 VIEW COMPARISON:  Prior chest radiographs 07/11/2020 and earlier. FINDINGS: Redemonstrated left chest dual lead implantable cardiac device. Mild cardiomegaly, unchanged. Aortic atherosclerosis. No appreciable airspace consolidation or pulmonary edema. No evidence of pleural effusion or pneumothorax. No acute bony abnormality identified. IMPRESSION: No evidence of acute cardiopulmonary abnormality. Mild cardiomegaly, unchanged. Aortic Atherosclerosis (ICD10-I70.0). Electronically Signed   By: Kellie Simmering DO   On: 10/19/2020 08:46   DG Abd Acute W/Chest  Result Date: 10/21/2020 CLINICAL DATA:  Abdominal pain EXAM: DG ABDOMEN ACUTE WITH 1 VIEW  CHEST COMPARISON:  Chest x-ray 11/19/2018 FINDINGS: Heart size and vascularity normal. Lungs clear without infiltrate or effusion. Dual lead pacemaker unchanged. Mildly distended large and small bowel loops present diffusely. There is gas and stool in the rectum. No free air. IMPRESSION: No acute cardiopulmonary abnormality Mildly  distended large and small bowel loops consistent with ileus. Electronically Signed   By: Franchot Gallo M.D.   On: 10/21/2020 14:41   DG Abd Portable 1V  Result Date: 10/24/2020 CLINICAL DATA:  Abdominal distension. EXAM: PORTABLE ABDOMEN - 1 VIEW COMPARISON:  10/23/2020. FINDINGS: Enteric tube tip and side-port project over the stomach. Multiple gaseous distended loops of bowel are demonstrated throughout the abdomen. Overall these appear slightly decreased when compared to recent prior exam. Left basilar atelectasis. Lumbar spine degenerative changes. IMPRESSION: Findings suggestive of mild improvement in gaseous distended loops of bowel within the abdomen. Electronically Signed   By: Lovey Newcomer M.D.   On: 10/24/2020 11:55   DG Abd Portable 1V-Small Bowel Obstruction Protocol-24 hr delay  Result Date: 10/23/2020 CLINICAL DATA:  Small bowel obstruction.  24 hour delayed image. EXAM: PORTABLE ABDOMEN - 1 VIEW COMPARISON:  Radiograph earlier today.  CT 10/21/2020 FINDINGS: Enteric tube tip and side-port in the stomach. Similar gaseous distension of small bowel, greatest in the left abdomen measuring 4.6 cm. There is no enteric contrast in the colon. Colonic stool most prominently involving the splenic flexure. No evidence of free air. Stable left pelvic phlebolith. Clear lung bases. IMPRESSION: Similar gaseous distension of small bowel, greatest in the left abdomen. Small volume of colonic stool without enteric contrast in the colon. Electronically Signed   By: Keith Rake M.D.   On: 10/23/2020 18:39   DG Abd Portable 1V-Small Bowel Obstruction Protocol-initial, 8 hr delay  Result Date: 10/23/2020 CLINICAL DATA:  Small-bowel obstruction EXAM: PORTABLE ABDOMEN - 1 VIEW COMPARISON:  10/22/2020 at 5:52 a.m. FINDINGS: Two supine frontal views of the abdomen and pelvis are obtained. Enteric catheter coiled over the gastric fundus. Diffuse gaseous distention of the small bowel again noted, measuring up to  4.9 cm in diameter. Gas and stool are seen throughout the colon to the level of the rectum. No masses or abnormal calcifications. IMPRESSION: 1. Persistent small bowel obstruction, with stable caliber of the small bowel. 2. Stable enteric catheter. Electronically Signed   By: Randa Ngo M.D.   On: 10/23/2020 02:42   DG Abd Portable 2V  Result Date: 10/22/2020 CLINICAL DATA:  60 year old male with suspected small bowel obstruction. EXAM: PORTABLE ABDOMEN - 2 VIEW COMPARISON:  10/21/2020 and earlier. FINDINGS: AP portable upright and supine views of the abdomen and pelvis. Enteric tube remains looped within the stomach. Negative lung bases. Partially visible cardiac pacer leads. No pneumoperitoneum. But continued gas-filled and dilated small bowel loops throughout the mid abdomen up to 46 mm diameter. Paucity of large bowel gas. Small volume retained stool in the rectum. Right nephrolithiasis redemonstrated. Stable visualized osseous structures. IMPRESSION: 1. Ongoing small bowel obstruction pattern, with stable dilated mid abdominal loops up to 46 mm diameter. 2. No free air. 3. Enteric tube within the stomach. Electronically Signed   By: Genevie Ann M.D.   On: 10/22/2020 06:11   CUP PACEART REMOTE DEVICE CHECK  Result Date: 09/28/2020 Scheduled remote reviewed. Normal device function.  RP Next remote 91 days.  VAS [place] LOWER EXTREMITY VENOUS (DVT)  Result Date: 10/24/2020  Lower Venous DVT Study Patient Name:  KINNIE KAUPP  Date of Exam:  10/24/2020 Medical Rec #: [id number]        Accession #:    [id number] Date of Birth: [birth year]        Patient Gender: M Patient Age:   59Y Exam Location:  [facility] Procedure:      VAS [place] LOWER EXTREMITY VENOUS (DVT) Referring Phys: [id number] Margaree Mackintosh [facility] --------------------------------------------------------------------------------  Indications: Covid, edema.  Comparison Study: Prior negative bilateral LEV done 10/15/2017 Performing Technologist: Sharion Dove RVS  Examination Guidelines: A complete evaluation includes B-mode imaging, spectral Doppler, color Doppler, and power Doppler as needed of all accessible portions of each vessel. Bilateral testing is considered an integral part of a complete examination. Limited examinations for reoccurring indications may be performed as noted. The reflux portion of the exam is performed with the patient in reverse Trendelenburg.  +---------+---------------+---------+-----------+----------+--------------+ RIGHT    CompressibilityPhasicitySpontaneityPropertiesThrombus Aging +---------+---------------+---------+-----------+----------+--------------+ CFV      Full           Yes      Yes                                 +---------+---------------+---------+-----------+----------+--------------+ SFJ      Full                                                        +---------+---------------+---------+-----------+----------+--------------+ FV Prox  Full                                                        +---------+---------------+---------+-----------+----------+--------------+ FV Mid   Full                                                        +---------+---------------+---------+-----------+----------+--------------+ FV DistalFull                                                        +---------+---------------+---------+-----------+----------+--------------+ PFV      Full                                                        +---------+---------------+---------+-----------+----------+--------------+ POP      Full           Yes      Yes                                 +---------+---------------+---------+-----------+----------+--------------+ PTV      Full                                                        +---------+---------------+---------+-----------+----------+--------------+  PERO     Full                                                         +---------+---------------+---------+-----------+----------+--------------+   +---------+---------------+---------+-----------+----------+--------------+ LEFT     CompressibilityPhasicitySpontaneityPropertiesThrombus Aging +---------+---------------+---------+-----------+----------+--------------+ CFV      Full           Yes      Yes                                 +---------+---------------+---------+-----------+----------+--------------+ SFJ      Full                                                        +---------+---------------+---------+-----------+----------+--------------+ FV Prox  Full                                                        +---------+---------------+---------+-----------+----------+--------------+ FV Mid   Full                                                        +---------+---------------+---------+-----------+----------+--------------+ FV DistalFull                                                        +---------+---------------+---------+-----------+----------+--------------+ PFV      Full                                                        +---------+---------------+---------+-----------+----------+--------------+ POP      Full           Yes      Yes                                 +---------+---------------+---------+-----------+----------+--------------+ PTV      Full                                                        +---------+---------------+---------+-----------+----------+--------------+ PERO     Full                                                        +---------+---------------+---------+-----------+----------+--------------+  Summary: BILATERAL: - No evidence of deep vein thrombosis seen in the lower extremities, bilaterally. -   *See table(s) above for measurements and observations. Electronically signed by Harold Barban MD on 10/24/2020 at 9:31:22 PM.    Final     Micro Results    Recent  Results (from the past 240 hour(s))  Resp Panel by RT-PCR (Flu A&B, Covid) Nasopharyngeal Swab     Status: Abnormal   Collection Time: 10/18/20 11:43 PM   Specimen: Nasopharyngeal Swab; Nasopharyngeal(NP) swabs in vial transport medium  Result Value Ref Range Status   SARS Coronavirus 2 by RT PCR POSITIVE (A) NEGATIVE Final    Comment: RESULT CALLED TO, READ BACK BY AND VERIFIED WITH: RN AMY CLLOODSELTER BY MESSAN H. AT [id number] ON 6 14/2022 (NOTE) SARS-CoV-2 target nucleic acids are DETECTED.  The SARS-CoV-2 RNA is generally detectable in upper respiratory specimens during the acute phase of infection. Positive results are indicative of the presence of the identified virus, but do not rule out bacterial infection or co-infection with other pathogens not detected by the test. Clinical correlation with patient history and other diagnostic information is necessary to determine patient infection status. The expected result is Negative.  Fact Sheet for Patients: EntrepreneurPulse.com.au  Fact Sheet for Healthcare Providers: IncredibleEmployment.be  This test is not yet approved or cleared by the [place] FDA and  has been authorized for detection and/or diagnosis of SARS-CoV-2 by FDA under an Emergency Use Authorization (EUA).  This EUA will remain in effect (mean ing this test can be used) for the duration of  the COVID-19 declaration under Section 564(b)(1) of the Act, 21 U.S.C. section 360bbb-3(b)(1), unless the authorization is terminated or revoked sooner.     Influenza A by PCR NEGATIVE NEGATIVE Final   Influenza B by PCR NEGATIVE NEGATIVE Final    Comment: (NOTE) The Xpert Xpress SARS-CoV-2/FLU/RSV plus assay is intended as an aid in the diagnosis of influenza from Nasopharyngeal swab specimens and should not be used as a sole basis for treatment. Nasal washings and aspirates are unacceptable for Xpert Xpress  SARS-CoV-2/FLU/RSV testing.  Fact Sheet for Patients: EntrepreneurPulse.com.au  Fact Sheet for Healthcare Providers: IncredibleEmployment.be  This test is not yet approved or cleared by the [place] FDA and has been authorized for detection and/or diagnosis of SARS-CoV-2 by FDA under an Emergency Use Authorization (EUA). This EUA will remain in effect (meaning this test can be used) for the duration of the COVID-19 declaration under Section 564(b)(1) of the Act, 21 U.S.C. section 360bbb-3(b)(1), unless the authorization is terminated or revoked.  Performed at [facility], [place] [address], [place], [place] [address]   MRSA PCR Screening     Status: None   Collection Time: 10/23/20 12:23 AM  Result Value Ref Range Status   MRSA by PCR NEGATIVE NEGATIVE Final    Comment:        The GeneXpert MRSA Assay (FDA approved for NASAL specimens only), is one component of a comprehensive MRSA colonization surveillance program. It is not intended to diagnose MRSA infection nor to guide or monitor treatment for MRSA infections. Performed at [facility], [place] [address], [place], [place] [address]     Today   Subjective    Peter Marsh today has no headache,no chest abdominal pain,no new weakness tingling or numbness, feels much better wants to go home today.     Objective   Blood pressure 160/90 pulse 86, temperature 98.8 F (37.1 C), temperature source  Oral, resp. rate 19, height 6\' 1"  (1.854 m), weight 97.3 kg, SpO2 96 %.   Intake/Output Summary (Last 24 hours) at 10/25/2020 1141 Last data filed at 10/24/2020 1812 Gross per 24 hour  Intake 480 ml  Output --  Net 480 ml    Exam  Awake Alert, No new F.N deficits, Normal affect [place].AT,PERRAL Supple Neck,No JVD, No cervical lymphadenopathy appriciated.  Symmetrical Chest wall movement, Good air movement bilaterally, CTAB RRR,No Gallops,Rubs or new Murmurs, No  Parasternal Heave +ve B.Sounds, Abd Soft, Non tender, No organomegaly appriciated, No rebound -guarding or rigidity. No Cyanosis, Clubbing or edema, No new Rash or bruise   Data Review   CBC w Diff:  Lab Results  Component Value Date   WBC 7.2 10/25/2020   HGB 13.6 10/25/2020   HGB 12.6 (L) 02/16/2017   HCT 41.9 10/25/2020   HCT 37.5 02/16/2017   PLT 175 10/25/2020   PLT 254 02/16/2017   LYMPHOPCT 18 10/25/2020   MONOPCT 15 10/25/2020   EOSPCT 2 10/25/2020   BASOPCT 0 10/25/2020    CMP:  Lab Results  Component Value Date   NA 139 10/25/2020   NA 137 09/20/2020   K 3.9 10/25/2020   CL 109 10/25/2020   CO2 21 (L) 10/25/2020   BUN 19 10/25/2020   BUN 19 09/20/2020   CREATININE 2.07 (H) 10/25/2020   PROT 5.9 (L) 10/25/2020   PROT 6.7 09/20/2020   ALBUMIN 3.2 (L) 10/25/2020   ALBUMIN 4.1 09/20/2020   BILITOT 1.3 (H) 10/25/2020   BILITOT 0.3 09/20/2020   ALKPHOS 45 10/25/2020   AST 24 10/25/2020   ALT 17 10/25/2020  .   Total Time in preparing paper work, data evaluation and todays exam - 52 minutes  Lala Lund M.D on 10/25/2020 at 11:41 AM  [facility]

## 2020-11-01 ENCOUNTER — Other Ambulatory Visit: Payer: Self-pay

## 2020-11-01 ENCOUNTER — Telehealth (INDEPENDENT_AMBULATORY_CARE_PROVIDER_SITE_OTHER): Payer: PPO | Admitting: Nurse Practitioner

## 2020-11-01 DIAGNOSIS — N211 Calculus in urethra: Secondary | ICD-10-CM | POA: Diagnosis not present

## 2020-11-01 DIAGNOSIS — Z8616 Personal history of COVID-19: Secondary | ICD-10-CM | POA: Diagnosis not present

## 2020-11-01 DIAGNOSIS — B1920 Unspecified viral hepatitis C without hepatic coma: Secondary | ICD-10-CM

## 2020-11-01 DIAGNOSIS — Z8719 Personal history of other diseases of the digestive system: Secondary | ICD-10-CM

## 2020-11-01 NOTE — Progress Notes (Signed)
   [place] will call [place], [place]  [address] Phone:  [phone]   Fax:  [phone] Virtual Visit via Video Note  I connected with Peter Marsh on 11/01/20 at  3:00 PM EDT by video and verified that I am speaking with the correct person using two identifiers.   I discussed the limitations, risks, security and privacy concerns of performing an evaluation and management service by video and the availability of in person appointments. I also discussed with the patient that there may be a patient responsible charge related to this service. The patient expressed understanding and agreed to proceed.  Patient home Provider Office  History of Present Illness: He  has a past medical history of Allergy, Anxiety, Asthma, Bilateral carotid bruits, Cardiac conduction disorder (2018), Cataract, Chronic kidney disease, Chronic kidney disease (CKD), stage IV (severe) ([place]), COPD (chronic obstructive pulmonary disease) ([place]), Depression, Encounter for care of pacemaker (02/13/2019), GERD (gastroesophageal reflux disease), History of stomach ulcers (2001), Hypertension, LBBB (left bundle branch block), Lower extremity edema, Mild intermittent asthma without complication, Mixed hyperlipidemia, Mobitz type 2 second degree AV block (04/06/2019), Neuropathy, Pacemaker: Medtronic Azure XT DR MRI [id number] PPM -  BUNDLE OF HIS pacing  (02/26/2017), Paroxysmal atrial flutter ([place]), PONV (postoperative nausea and vomiting), Prostatitis, Recurrent upper respiratory infection (URI), Schizophrenia ([place]), Sinus node dysfunction ([place]) (02/13/2019), and Urticaria.    Patient is seen virtually today for hospital follow-up. Recently admitted for SBO and COVID-19 . Hospital course of treatment was from 10/19/2020 to 10/25/2020.  During hospital course was treated conservatively with NG tube for bowel rest and fluids. He was also treated for AKI which improved however evidence of a renal stone  was noted on CT scan. COVID was not treated based on patient being asymptomatic.  A abdominal aortic aneurysm was noted and he is going to have surgery in the future.   He is feeling a lot better. He is working around his home. Denies headache, dizziness, visual changes, shortness of breath, dyspnea on exertion, chest pain, nausea, vomiting or any edema.    Observations/Objective: No exam due to virtual visit.  Patient in no apparent distress  Assessment  Primary Diagnosis & Pertinent Problem List: The primary encounter diagnosis was Hx of small bowel obstruction. Diagnoses of Urethral stone, Personal history of COVID-19, and Hepatitis C test positive were also pertinent to this visit.  Visit Diagnosis: 1. Hx of small bowel obstruction  Resolved Able to maintain diet and remain hydrated without difficulty  2. Urethral stone  Stable no pain CT scan ordered  3. Personal history of COVID-19  Post vaccination minimal symptoms  4. Hepatitis C test positive  Referral to infectious disease for surveillance    Follow Up Instructions: Fu as scheduled lab only apt on tomorrow    I discussed the assessment and treatment plan with the patient. The patient was provided an opportunity to ask questions and all were answered. The patient agreed with the plan and demonstrated an understanding of the instructions.   The patient was advised to call back or seek an in-person evaluation if the symptoms worsen or if the condition fails to improve as anticipated.  I provided 12 minutes of video- visit time during this encounter.   Vevelyn Francois, NP

## 2020-11-02 ENCOUNTER — Other Ambulatory Visit: Payer: PPO

## 2020-11-02 DIAGNOSIS — Z8616 Personal history of COVID-19: Secondary | ICD-10-CM | POA: Diagnosis not present

## 2020-11-02 DIAGNOSIS — N211 Calculus in urethra: Secondary | ICD-10-CM | POA: Diagnosis not present

## 2020-11-03 LAB — COMP. METABOLIC PANEL (12)
AST: 20 IU/L (ref 0–40)
Albumin/Globulin Ratio: 1.3 (ref 1.2–2.2)
Albumin: 3.5 g/dL — ABNORMAL LOW (ref 3.8–4.8)
Alkaline Phosphatase: 101 IU/L (ref 44–121)
BUN/Creatinine Ratio: 8 — ABNORMAL LOW (ref 10–24)
BUN: 18 mg/dL (ref 8–27)
Bilirubin Total: 0.3 mg/dL (ref 0.0–1.2)
Calcium: 9.1 mg/dL (ref 8.6–10.2)
Chloride: 99 mmol/L (ref 96–106)
Creatinine, Ser: 2.14 mg/dL — ABNORMAL HIGH (ref 0.76–1.27)
Globulin, Total: 2.6 g/dL (ref 1.5–4.5)
Glucose: 109 mg/dL — ABNORMAL HIGH (ref 65–99)
Potassium: 5 mmol/L (ref 3.5–5.2)
Sodium: 136 mmol/L (ref 134–144)
Total Protein: 6.1 g/dL (ref 6.0–8.5)
eGFR: 34 mL/min/{1.73_m2} — ABNORMAL LOW (ref >59)

## 2020-11-03 LAB — CBC WITH DIFFERENTIAL/PLATELET
Basophils Absolute: 0.1 10*3/uL (ref 0.0–0.2)
Basos: 1 %
EOS (ABSOLUTE): 0.3 10*3/uL (ref 0.0–0.4)
Eos: 4 %
Hematocrit: 35.2 % — ABNORMAL LOW (ref 37.5–51.0)
Hemoglobin: 11.6 g/dL — ABNORMAL LOW (ref 13.0–17.7)
Immature Grans (Abs): 0.1 10*3/uL (ref 0.0–0.1)
Immature Granulocytes: 1 %
Lymphocytes Absolute: 1.6 10*3/uL (ref 0.7–3.1)
Lymphs: 20 %
MCH: 28.7 pg (ref 26.6–33.0)
MCHC: 33 g/dL (ref 31.5–35.7)
MCV: 87 fL (ref 79–97)
Monocytes Absolute: 0.7 10*3/uL (ref 0.1–0.9)
Monocytes: 9 %
Neutrophils Absolute: 5.1 10*3/uL (ref 1.4–7.0)
Neutrophils: 65 %
Platelets: 280 10*3/uL (ref 150–450)
RBC: 4.04 x10E6/uL — ABNORMAL LOW (ref 4.14–5.80)
RDW: 12.7 % (ref 11.6–15.4)
WBC: 7.8 10*3/uL (ref 3.4–10.8)

## 2020-11-04 ENCOUNTER — Encounter: Payer: Self-pay | Admitting: Infectious Diseases

## 2020-11-04 ENCOUNTER — Other Ambulatory Visit: Payer: Self-pay

## 2020-11-04 ENCOUNTER — Ambulatory Visit (INDEPENDENT_AMBULATORY_CARE_PROVIDER_SITE_OTHER): Payer: PPO

## 2020-11-04 ENCOUNTER — Other Ambulatory Visit (HOSPITAL_COMMUNITY): Payer: Self-pay

## 2020-11-04 ENCOUNTER — Telehealth: Payer: Self-pay

## 2020-11-04 ENCOUNTER — Ambulatory Visit (INDEPENDENT_AMBULATORY_CARE_PROVIDER_SITE_OTHER): Payer: PPO | Admitting: Infectious Diseases

## 2020-11-04 VITALS — BP 175/116 | HR 75 | Temp 97.8°F | Wt 226.4 lb

## 2020-11-04 DIAGNOSIS — I10 Essential (primary) hypertension: Secondary | ICD-10-CM | POA: Diagnosis not present

## 2020-11-04 DIAGNOSIS — R768 Other specified abnormal immunological findings in serum: Secondary | ICD-10-CM | POA: Diagnosis not present

## 2020-11-04 DIAGNOSIS — Z23 Encounter for immunization: Secondary | ICD-10-CM | POA: Diagnosis not present

## 2020-11-04 DIAGNOSIS — Z8619 Personal history of other infectious and parasitic diseases: Secondary | ICD-10-CM

## 2020-11-04 DIAGNOSIS — U071 COVID-19: Secondary | ICD-10-CM | POA: Diagnosis not present

## 2020-11-04 DIAGNOSIS — R7689 Other specified abnormal immunological findings in serum: Secondary | ICD-10-CM

## 2020-11-04 NOTE — Assessment & Plan Note (Signed)
He has fully recovered from recent mild infection.  I will give fourth dose Pfizer today to update his vaccine status.  He was appreciative of this.

## 2020-11-04 NOTE — Progress Notes (Signed)
Patient Name: Peter Marsh  Date of Birth: Jul 08, 1960  MRN: [id number]  PCP: Vevelyn Francois, NP  Referring Provider: Vevelyn Francois, NP, Ph#: [phone]   Patient Active Problem List   Diagnosis Date Noted   SBO (small bowel obstruction) ([place]) 10/22/2020   Ileus ([place]) 10/21/2020   COVID-19 virus infection 10/21/2020   Aneurysm of infrarenal abdominal aorta ([place]) 10/21/2020   Allergic rhinitis due to pollen 09/22/2020   Chronic obstructive pulmonary disease, unspecified ([place]) 09/22/2020   Chronic pancreatitis ([place]) 09/22/2020   Gastric ulcer, unspecified as acute or chronic, without hemorrhage or perforation 09/22/2020   History of adenomatous polyp of colon 09/22/2020   Hyperglycemia 09/22/2020   Left bundle branch block 09/22/2020   Schizoaffective disorder, bipolar type ([place]) 09/22/2020   Testicular hypofunction 09/22/2020   Chronic kidney disease due to hypertension 09/22/2020   Acute renal failure superimposed on stage 4 chronic kidney disease ([place]) 09/22/2020   Barrett's esophagus 09/22/2020   Chronic GERD 09/22/2020   Psychotic disorder ([place]) 09/22/2020   Atrial flutter ([place]) 09/22/2020   Hepatitis C antibody positive in blood 09/22/2020   Hypertensive heart and chronic kidney disease with heart failure and stage 1 through stage 4 chronic kidney disease, or chronic kidney disease ([place]) 09/20/2020   Gait abnormality 10/22/2019   Vitamin B12 deficiency 10/22/2019   Idiopathic peripheral neuropathy 10/22/2019   Paresthesia 09/08/2019   Mobitz type 2 second degree AV block 04/06/2019   Encounter for care of pacemaker 02/13/2019   Sinus node dysfunction ([place]) 02/13/2019   GERD (gastroesophageal reflux disease) 11/19/2018   Other forms of angina pectoris ([place]) 06/24/2018   Essential hypertension 06/24/2018   Wide QRS ventricular tachycardia ([place]) 02/26/2017   Pacemaker: Medtronic Azure XT DR MRI [id number] PPM -  BUNDLE OF HIS pacing  02/26/2017   CKD (chronic kidney  disease), stage III ([place]) 09/16/2016   Schizophrenia ([place]) 09/16/2016   Hematemesis 09/16/2016   Asthma 02/09/2016   Herniated lumbar intervertebral disc 04/20/2011   Hyperlipidemia with target LDL less than 130 04/20/2009   Gastroparesis 11/24/2008   History of tobacco abuse 08/14/2008   Low back pain 11/15/2007    CC:  New patient - initial evaluation and management of chronic hepatitis C infection.     HPI/ROS:  Peter Marsh is a 60 y.o. male.   He tells me that he has a history of hepatitis C that was diagnosed and treated between 2003 and 2004 in [place].  He took a year of interferon injections and Ribavarin pills.  Liver biopsy at the time and posttreatment revealed that he did not have any cirrhosis and that he was eradicated liver disease.  He had a hospitalization here recently at [facility] for small bowel obstruction and suicide ideation.  Also found to have mild COVID-19 disease.  His hepatitis C antibody was repeated at this time and found to be positive.  There was no reflex RNA to determine chronic or cleared infection.  His initial risk factor for hepatitis acquisition was related to drug use.  Peter Marsh states that he has not had any relapses with intranasal or injection substance use since he was cleared of hepatitis C in 2004.  He does not drink any alcohol. He has been married 3 times, first 2 wives are deceased.  Present wife has been with for 16 years almost.  No tattoos, piercings, incarcerations.  He has stage IV kidney disease but has never been on dialysis.  No blood  transfusions.   Constitutional: negative for fevers, chills, and anorexia Eyes: negative for icterus Gastrointestinal: negative for nausea, vomiting, abdominal pain, and jaundice Musculoskeletal: negative for arthralgias Neurological: negative for headaches, memory problems, and gait problems Behavioral/Psych: negative for excessive alcohol consumption and illegal drug usage All  other systems reviewed and are negative      Past Medical History:  Diagnosis Date   Allergy    Anxiety    Asthma    uses inhalers    Bilateral carotid bruits    Cardiac conduction disorder 2018   s/p MDT PPM   Cataract    Chronic kidney disease    bladder interstial cystitis    Chronic kidney disease (CKD), stage IV (severe) ([place])    followed by Dr. Joelyn Oms at [facility]   COPD (chronic obstructive pulmonary disease) ([place])    Depression    Encounter for care of pacemaker 02/13/2019   GERD (gastroesophageal reflux disease)    History of stomach ulcers 2001   Hypertension    LBBB (left bundle branch block)    Lower extremity edema    Mild intermittent asthma without complication    Mixed hyperlipidemia    Mobitz type 2 second degree AV block 04/06/2019   Neuropathy    Pacemaker: Medtronic Azure XT DR MRI [id number]- PPM -  BUNDLE OF HIS pacing  02/26/2017   Scheduled Remote pacemaker check  11/12/2018:  There were 24 Fast AV episodes:  EGMs show SVTs. Episodes lasted < 2 minutes. Health trends do not demonstrate significant abnormality. Battery longevity is 9.4 - 10.3 years. RA pacing is 47.1 %, RV pacing is 40.4 %.  Clinic check 11/07/17.    Paroxysmal atrial flutter ([name])    PONV (postoperative nausea and vomiting)    Prostatitis    Recurrent upper respiratory infection (URI)    Schizophrenia ([name])    Sinus node dysfunction ([place]) 02/13/2019   Urticaria     Prior to Admission medications   Medication Sig Start Date End Date Taking? Authorizing Provider  acetaminophen (TYLENOL) 325 MG tablet Take 650 mg by mouth every 6 (six) hours as needed for mild pain or headache.   Yes [provider]  albuterol (VENTOLIN HFA) 108 (90 Base) MCG/ACT inhaler Inhale 2 puffs into the lungs every 6 (six) hours as needed for wheezing or shortness of breath. 09/08/20  Yes Ambs, Kathrine Cords, FNP  Budeson-Glycopyrrol-Formoterol (BREZTRI AEROSPHERE) 160-9-4.8 MCG/ACT AERO 2 puffs twice a day with  a spacer 09/08/20  Yes Ambs, Kathrine Cords, FNP  buPROPion [facility] SR) 150 MG 12 hr tablet Take 150 mg by mouth 2 (two) times daily.   Yes [provider]  cloNIDine (CATAPRES) 0.2 MG tablet Take 1 tablet (0.2 mg total) by mouth 3 (three) times daily. Please keep upcoming appt with Dr. Lovena Le in July 2022 before anymore refills. Thank you 10/25/20  Yes Thurnell Lose, MD  Cyanocobalamin (VITAMIN B-12 PO) Take 1 tablet by mouth daily.   Yes [provider]  docusate sodium (COLACE) 100 MG capsule Take 1 capsule (100 mg total) by mouth 2 (two) times daily. 10/25/20  Yes Thurnell Lose, MD  ezetimibe (ZETIA) 10 MG tablet Take 1 tablet (10 mg total) by mouth daily. 05/21/20 11/04/20 Yes O'Neal, Cassie Freer, MD  Fenofibrate 50 MG CAPS Take 1 capsule (50 mg total) by mouth daily. 02/17/20 02/16/21 Yes King, Diona Foley, NP  fluticasone (FLONASE) 50 MCG/ACT nasal spray Place 1 spray into both nostrils daily as needed for allergies.  09/08/20  Yes Ambs, Kathrine Cords, FNP  furosemide (LASIX) 20 MG tablet Take 1 tablet (20 mg total) by mouth daily. 07/15/20  Yes O'Neal, Cassie Freer, MD  gabapentin (NEURONTIN) 100 MG capsule Take 1 capsule (100 mg total) by mouth 3 (three) times daily. 09/08/19  Yes Marcial Pacas, MD  hydrALAZINE (APRESOLINE) 50 MG tablet Take 1 tablet (50 mg total) by mouth 3 (three) times daily. 04/15/20  Yes O'Neal, Cassie Freer, MD  isosorbide dinitrate (ISORDIL) 30 MG tablet TAKE 1 TABLET(30 MG) BY MOUTH FOUR TIMES DAILY 10/25/20  Yes Thurnell Lose, MD  metoprolol tartrate (LOPRESSOR) 100 MG tablet Take 1 tablet (100 mg total) by mouth 2 (two) times daily. 04/15/20  Yes O'Neal, Cassie Freer, MD  montelukast (SINGULAIR) 10 MG tablet Take 1 tablet (10 mg total) by mouth at bedtime. 09/08/20 12/07/20 Yes Ambs, Kathrine Cords, FNP  ondansetron (ZOFRAN ODT) 4 MG disintegrating tablet Take 1 tablet (4 mg total) by mouth every 8 (eight) hours as needed for nausea or vomiting. 10/25/20  Yes Thurnell Lose, MD  paliperidone (INVEGA) 6 MG 24 hr tablet Take 6 mg by mouth at bedtime.   Yes [provider]  Pancrelipase, Lip-Prot-Amyl, (ZENPEP) 40000-126000 units CPEP Take 1 tablet by mouth with breakfast, with lunch, and with evening meal. 04/12/20  Yes Thornton Park, MD  pantoprazole (PROTONIX) 40 MG tablet Take 1 tablet (40 mg total) by mouth 2 (two) times daily. 04/12/20  Yes Thornton Park, MD  sucralfate (CARAFATE) 1 GM/10ML suspension Take 10 mLs (1 g total) by mouth 4 (four) times daily -  with meals and at bedtime. 10/25/20  Yes Thurnell Lose, MD    Allergies  Allergen Reactions   Methylpyrrolidone Hives    froze the intestine   Niacin Itching, Nausea And Vomiting and Other (See Comments)    Flushing, itching, tingling    Ace Inhibitors     Other reaction(s): CKD 4   Norvasc [Amlodipine Besylate] Other (See Comments)    Swollen Feet   Other Other (See Comments)   Oxybutynin Chloride [Oxybutynin Chloride Er] Other (See Comments)    froze the intestine   Vesicare [Solifenacin Succinate] Other (See Comments)    Froze the intestine    Ciprofloxacin Rash and Other (See Comments)    Felt flushed    Oxybutynin Rash and Other (See Comments)    bowel obst   Solifenacin Rash    Social History   Tobacco Use   Smoking status: Former    Packs/day: 1.00    Years: 30.00    Pack years: 30.00    Types: Cigarettes    Quit date: 05/08/2008    Years since quitting: 12.5   Smokeless tobacco: Never  Vaping Use   Vaping Use: Never used  Substance Use Topics   Alcohol use: No   Drug use: No    Family History  Problem Relation Age of Onset   High blood pressure Mother    Alzheimer's disease Father    Colon cancer Neg Hx    Esophageal cancer Neg Hx    Rectal cancer Neg Hx    Stomach cancer Neg Hx      Objective:   Vitals:   11/04/20 0855  BP: (!) 175/116  Pulse: 75  Temp: 97.8 F (36.6 C)  SpO2: 97%   Constitutional: in no apparent distress and well  developed and well nourished Eyes: anicteric Cardiovascular: Cor RRR Respiratory: clear Gastrointestinal: Bowel sounds are normal, liver is not  enlarged, spleen is not enlarged Musculoskeletal: peripheral pulses normal, no pedal edema, no clubbing or cyanosis Skin: negative for - jaundice, spider hemangioma, telangiectasia, palmar erythema, ecchymosis and atrophy; no porphyria cutanea tarda Lymphatic: no cervical lymphadenopathy   Laboratory: Genotype: No results found for: HCVGENOTYPE HCV viral load: No results found for: HCVQUANT Lab Results  Component Value Date   WBC 7.8 11/02/2020   HGB 11.6 (L) 11/02/2020   HCT 35.2 (L) 11/02/2020   MCV 87 11/02/2020   PLT 280 11/02/2020    Lab Results  Component Value Date   CREATININE 2.14 (H) 11/02/2020   BUN 18 11/02/2020   NA 136 11/02/2020   K 5.0 11/02/2020   CL 99 11/02/2020   CO2 21 (L) 10/25/2020    Lab Results  Component Value Date   ALT 17 10/25/2020   AST 20 11/02/2020   ALKPHOS 101 11/02/2020    Lab Results  Component Value Date   INR 1.1 11/19/2018   BILITOT 0.3 11/02/2020   ALBUMIN 3.5 (L) 11/02/2020    Imaging:  CT scan 10/2020  -  Hepatobiliary: The liver is unremarkable. No intrahepatic biliary dilatation. Small gallstone. No pericholecystic fluid or evidence of acute cholecystitis by CT.     Assessment & Plan:   Problem List Items Addressed This Visit       Unprioritized   Hepatitis C antibody positive in blood    Peter Marsh has a history of hepatitis C genotype unknown previously treated with 49-month course of interferon injections and Ribavarin pills.  It sounds like he was cured in 2004 and his risk for reinfection is essentially 0 based on our discussion today.  Considering the fact that only an antibody was checked in the hospital setting I strongly suspect he still cured from infection.  I explained to him how the antibody test will always stay positive lifelong despite it being active or  cured.  We will check hep B surface antigen and antibody today to make certain he does not have hepatitis B infection, though I doubt it with normal LFTs and normal-appearing liver on recent imaging.  We will send a note to his PCP if he needs booster vaccines for hepatitis B based on today's labs. Answered all questions today.  I will get back in touch with him with results today with follow up pending these studies.        Essential hypertension    BP Readings from Last 3 Encounters:  11/04/20 (!) 175/116  10/25/20 (!) 181/102  10/19/20 (!) 215/107  He states he forgot to take his blood pressure medication before coming to today's visit.  Appears to be quite high last few checks presumably when he was in the hospital.  Will take when he gets home and FU with PCP        COVID-19 virus infection    He has fully recovered from recent mild infection.  I will give fourth dose Pfizer today to update his vaccine status.  He was appreciative of this.       Other Visit Diagnoses     History of hepatitis C    -  Primary   Relevant Orders   Hepatitis C RNA quantitative ([facility])   Hepatitis B surface antibody,qualitative   Hepatitis B surface antigen       Janene Madeira, MSN, NP-C [facility] [email or web address] Pager: [phone] Office: [phone] [place]: [phone]

## 2020-11-04 NOTE — Assessment & Plan Note (Signed)
BP Readings from Last 3 Encounters:  11/04/20 (!) 175/116  10/25/20 (!) 181/102  10/19/20 (!) 215/107   He states he forgot to take his blood pressure medication before coming to today's visit.  Appears to be quite high last few checks presumably when he was in the hospital.  Will take when he gets home and FU with PCP

## 2020-11-04 NOTE — Patient Instructions (Signed)
So nice to meet you.   Please stop by the lab on your way out so we can help verify my suspicion that you are still cured from hepatitis C.   The ANTIBODY (Ab) will always forever be positive for you whether it is active or cleared. That is the test that was checked.   Today we are looking for active viral genes to see if you need treatment or not.   We gave you your pfizer booster today as well making you completely up to date for your vaccines.

## 2020-11-04 NOTE — Assessment & Plan Note (Signed)
[name] has a history of hepatitis C genotype unknown previously treated with 62-month course of interferon injections and Ribavarin pills.  It sounds like he was cured in 2004 and his risk for reinfection is essentially 0 based on our discussion today.  Considering the fact that only an antibody was checked in the hospital setting I strongly suspect he still cured from infection.  I explained to him how the antibody test will always stay positive lifelong despite it being active or cured.  We will check hep B surface antigen and antibody today to make certain he does not have hepatitis B infection, though I doubt it with normal LFTs and normal-appearing liver on recent imaging.  We will send a note to his PCP if he needs booster vaccines for hepatitis B based on today's labs. Answered all questions today.  I will get back in touch with him with results today with follow up pending these studies.

## 2020-11-04 NOTE — Telephone Encounter (Signed)
[name], English as a foreign language completed.    The patient is insured through [employer].  Medication will need a PA.  We will continue to follow to see if copay assistance is needed.  Peter Marsh, [facility] Patient [facility] Phone: [phone] Fax:  [phone]

## 2020-11-04 NOTE — Progress Notes (Signed)
   Covid-19 Vaccination Clinic  Name:  SAAGAR TORTORELLA    MRN: [id number] DOB: 1961-03-25  11/04/2020  Mr. Spinnato was observed post Covid-19 immunization for 15 minutes without incident. He was provided with Vaccine Information Sheet and instruction to access the V-Safe system.   Mr. Kinnear was instructed to call 911 with any severe reactions post vaccine: Difficulty breathing  Swelling of face and throat  A fast heartbeat  A bad rash all over body  Dizziness and weakness   Immunizations Administered     Name Date Dose VIS Date Route   PFIZER Comrnaty(Gray TOP) Covid-19 Vaccine 11/04/2020  9:30 AM 0.3 mL 04/15/2020 Intramuscular   Manufacturer: [place]   Lot: [id number]   NDC: [phone]

## 2020-11-06 LAB — HEPATITIS B SURFACE ANTIGEN: Hepatitis B Surface Ag: NONREACTIVE

## 2020-11-06 LAB — HEPATITIS B SURFACE ANTIBODY,QUALITATIVE: Hep B S Ab: NONREACTIVE

## 2020-11-06 LAB — HEPATITIS C RNA QUANTITATIVE
HCV Quantitative Log: <1.18 log IU/mL
HCV RNA, PCR, QN: <15 IU/mL

## 2020-11-07 NOTE — Progress Notes (Signed)
No chronic hep c with negative RNA level. Antibody will always be positive despite previous eradication of infection.  He does need hepatitis B vaccines again to re-boost immunity.

## 2020-11-10 ENCOUNTER — Telehealth: Payer: Self-pay

## 2020-11-10 NOTE — Telephone Encounter (Signed)
Patient calls today to see about scheduling referral appt with Dr. Donzetta Matters. He is having some stomach trouble at night. Denies back pain. Advised him the referral was not processed yet, suspect feeling ill at night related to recent hospitalization for enteritis and mild ileus. Advised him to inform PCP - call back if further issues.

## 2020-11-15 ENCOUNTER — Telehealth: Payer: Self-pay

## 2020-11-15 NOTE — Telephone Encounter (Signed)
He is seeing Peter Marsh. Spoke to his wife. I am glad he is safe. He is not  on [employer] or our device clinic. That is all good JG

## 2020-11-15 NOTE — Telephone Encounter (Signed)
Called patient on both numbers on file to schedule a pacemaker check appointment for the 13th.

## 2020-11-15 NOTE — Telephone Encounter (Signed)
Spoke with patient to relay result note per Janene Madeira, NP. Advised him that his hepatitis C RNA level is undetectable, but that his Hep C antibody will continue to remain positive. Explained that [place] would like for him to be vaccinated against hepatitis B to boost immunity. Patient aware that he can receive these vaccines with his primary care provider and has no further questions.   Beryle Flock, RN

## 2020-11-30 ENCOUNTER — Ambulatory Visit (INDEPENDENT_AMBULATORY_CARE_PROVIDER_SITE_OTHER): Payer: PPO | Admitting: Internal Medicine

## 2020-11-30 ENCOUNTER — Encounter: Payer: Self-pay | Admitting: Internal Medicine

## 2020-11-30 ENCOUNTER — Other Ambulatory Visit: Payer: Self-pay

## 2020-11-30 VITALS — BP 130/84 | HR 88 | Ht 73.0 in | Wt 217.8 lb

## 2020-11-30 DIAGNOSIS — Z950 Presence of cardiac pacemaker: Secondary | ICD-10-CM

## 2020-11-30 DIAGNOSIS — I495 Sick sinus syndrome: Secondary | ICD-10-CM | POA: Diagnosis not present

## 2020-11-30 DIAGNOSIS — I4892 Unspecified atrial flutter: Secondary | ICD-10-CM

## 2020-11-30 NOTE — Patient Instructions (Signed)
Medication Instructions:  Your physician recommends that you continue on your current medications as directed. Please refer to the Current Medication list given to you today.  Labwork: None ordered.  Testing/Procedures: None ordered.  Follow-Up: Your physician wants you to follow-up in: one year with Cristopher Peru, MD or one of the following Advanced Practice Providers on your designated Care Team:   Tommye Standard, [place] Legrand Como "Jonni Sanger" Chalmers Cater, [place]  Remote monitoring is used to monitor your Pacemaker from home. This monitoring reduces the number of office visits required to check your device to one time per year. It allows [place] to keep an eye on the functioning of your device to ensure it is working properly. You are scheduled for a device check from home on 12/27/2020. You may send your transmission at any time that day. If you have a wireless device, the transmission will be sent automatically. After your physician reviews your transmission, you will receive a postcard with your next transmission date.  Any Other Special Instructions Will Be Listed Below (If Applicable).  If you need a refill on your cardiac medications before your next appointment, please call your pharmacy.

## 2020-11-30 NOTE — Progress Notes (Signed)
HPI Peter Marsh returns today for followup. He is a pleasant 60 yo man with conduction system disease and a wide QRS tachy who underwent PPM insertion almost 4 years ago. He has done well in the interim with no chest pain or sob. Minimal peripheral edema. He denies syncope or peripheral edema. He has had a single episode of probable SVT for a minute. he denies palpitations. No additional syncope. He has lost some weight. He was in the hospital with a SBO and Covid.      Allergies   Allergies  Allergen Reactions   Methylpyrrolidone Hives    froze the intestine   Niacin Itching, Nausea And Vomiting and Other (See Comments)    Flushing, itching, tingling    Ace Inhibitors     Other reaction(s): CKD 4   Norvasc [Amlodipine Besylate] Other (See Comments)    Swollen Feet   Other Other (See Comments)   Oxybutynin Chloride [Oxybutynin Chloride Er] Other (See Comments)    froze the intestine   Vesicare [Solifenacin Succinate] Other (See Comments)    Froze the intestine    Ciprofloxacin Rash and Other (See Comments)    Felt flushed    Oxybutynin Rash and Other (See Comments)    bowel obst   Solifenacin Rash     Current Outpatient Medications  Medication Sig Dispense Refill   acetaminophen (TYLENOL) 325 MG tablet Take 650 mg by mouth every 6 (six) hours as needed for mild pain or headache.     albuterol (VENTOLIN HFA) 108 (90 Base) MCG/ACT inhaler Inhale 2 puffs into the lungs every 6 (six) hours as needed for wheezing or shortness of breath. 18 g 1   Budeson-Glycopyrrol-Formoterol (BREZTRI AEROSPHERE) 160-9-4.8 MCG/ACT AERO 2 puffs twice a day with a spacer 10.7 g 2   buPROPion (WELLBUTRIN SR) 150 MG 12 hr tablet Take 150 mg by mouth 2 (two) times daily.     cloNIDine (CATAPRES) 0.2 MG tablet Take 1 tablet (0.2 mg total) by mouth 3 (three) times daily. Please keep upcoming appt with Dr. Lovena Le in July 2022 before anymore refills. Thank you 90 tablet 0   Cyanocobalamin (VITAMIN  B-12 PO) Take 1 tablet by mouth daily.     docusate sodium (COLACE) 100 MG capsule Take 1 capsule (100 mg total) by mouth 2 (two) times daily. 60 capsule 0   Fenofibrate 50 MG CAPS Take 1 capsule (50 mg total) by mouth daily. 30 capsule 11   fluticasone (FLONASE) 50 MCG/ACT nasal spray Place 1 spray into both nostrils daily as needed for allergies. 11.1 mL 3   furosemide (LASIX) 20 MG tablet Take 1 tablet (20 mg total) by mouth daily. 30 tablet 3   gabapentin (NEURONTIN) 100 MG capsule Take 1 capsule (100 mg total) by mouth 3 (three) times daily. 90 capsule 11   hydrALAZINE (APRESOLINE) 50 MG tablet Take 1 tablet (50 mg total) by mouth 3 (three) times daily. 270 tablet 1   isosorbide dinitrate (ISORDIL) 30 MG tablet TAKE 1 TABLET(30 MG) BY MOUTH FOUR TIMES DAILY 360 tablet 1   metoprolol tartrate (LOPRESSOR) 100 MG tablet Take 1 tablet (100 mg total) by mouth 2 (two) times daily. 180 tablet 1   montelukast (SINGULAIR) 10 MG tablet Take 1 tablet (10 mg total) by mouth at bedtime. 90 tablet 1   ondansetron (ZOFRAN ODT) 4 MG disintegrating tablet Take 1 tablet (4 mg total) by mouth every 8 (eight) hours as needed for nausea or vomiting. [place]  tablet 0   paliperidone (INVEGA) 6 MG 24 hr tablet Take 6 mg by mouth at bedtime.     Pancrelipase, Lip-Prot-Amyl, (ZENPEP) 40000-126000 units CPEP Take 1 tablet by mouth with breakfast, with lunch, and with evening meal. 90 capsule 2   pantoprazole (PROTONIX) 40 MG tablet Take 1 tablet (40 mg total) by mouth 2 (two) times daily. 180 tablet 3   sucralfate (CARAFATE) 1 GM/10ML suspension Take 10 mLs (1 g total) by mouth 4 (four) times daily -  with meals and at bedtime. 3600 mL 3   ezetimibe (ZETIA) 10 MG tablet Take 1 tablet (10 mg total) by mouth daily. 90 tablet 3   No current facility-administered medications for this visit.   Facility-Administered Medications Ordered in Other Visits  Medication Dose Route Frequency Provider Last Rate Last Admin    acetaminophen (OFIRMEV) IVPB    PRN Lissa Morales, CRNA   1,000 mg at 04/19/11 0910   glycopyrrolate (ROBINUL) injection    PRN Lissa Morales, CRNA   0.8 mg at 04/19/11 1037     Past Medical History:  Diagnosis Date   Allergy    Anxiety    Asthma    uses inhalers    Bilateral carotid bruits    Cardiac conduction disorder 2018   s/p MDT PPM   Cataract    Chronic kidney disease    bladder interstial cystitis    Chronic kidney disease (CKD), stage IV (severe) ([place])    followed by Dr. Joelyn Oms at [facility]   COPD (chronic obstructive pulmonary disease) ([place])    Depression    Encounter for care of pacemaker 02/13/2019   GERD (gastroesophageal reflux disease)    History of stomach ulcers 2001   Hypertension    LBBB (left bundle branch block)    Lower extremity edema    Mild intermittent asthma without complication    Mixed hyperlipidemia    Mobitz type 2 second degree AV block 04/06/2019   Neuropathy    Pacemaker: Medtronic Azure XT DR MRI [id number] PPM -  BUNDLE OF HIS pacing  02/26/2017   Scheduled Remote pacemaker check  11/12/2018:  There were 24 Fast AV episodes:  EGMs show SVTs. Episodes lasted < 2 minutes. Health trends do not demonstrate significant abnormality. Battery longevity is 9.4 - 10.3 years. RA pacing is 47.1 %, RV pacing is 40.4 %.  Clinic check 11/07/17.    Paroxysmal atrial flutter ([name])    PONV (postoperative nausea and vomiting)    Prostatitis    Recurrent upper respiratory infection (URI)    Schizophrenia ([name])    Sinus node dysfunction ([place]) 02/13/2019   Urticaria     ROS:   All systems reviewed and negative except as noted in the HPI.   Past Surgical History:  Procedure Laterality Date   ADENOIDECTOMY     APPENDECTOMY     COLONOSCOPY     ELECTROPHYSIOLOGY STUDY N/A 02/26/2017   Procedure: ELECTROPHYSIOLOGY STUDY;  Surgeon: Evans Lance, MD;  Location: [facility];  Service: Cardiovascular;  Laterality: N/A;    ESOPHAGOGASTRODUODENOSCOPY (EGD) WITH PROPOFOL N/A 09/18/2016   Procedure: ESOPHAGOGASTRODUODENOSCOPY (EGD) WITH PROPOFOL;  Surgeon: Ronnette Juniper, MD;  Location: [facility];  Service: Gastroenterology;  Laterality: N/A;   LUMBAR LAMINECTOMY/DECOMPRESSION MICRODISCECTOMY  04/19/2011   Procedure: LUMBAR LAMINECTOMY/DECOMPRESSION MICRODISCECTOMY;  Surgeon: Tobi Bastos;  Location: [facility];  Service: Orthopedics;  Laterality: Left;  Hemi LAminectomy/Microdiscectomy Lumbar four  - Lumbar five  on the Left (X-Ray)  PACEMAKER IMPLANT N/A 02/26/2017   Procedure: PACEMAKER IMPLANT;  Surgeon: Evans Lance, MD;  Location: [facility];  Service: Cardiovascular;  Laterality: N/A;   TONSILLECTOMY     UPPER GASTROINTESTINAL ENDOSCOPY       Family History  Problem Relation Age of Onset   High blood pressure Mother    Alzheimer's disease Father    Colon cancer Neg Hx    Esophageal cancer Neg Hx    Rectal cancer Neg Hx    Stomach cancer Neg Hx      Social History   Socioeconomic History   Marital status: Married    Spouse name: Not on file   Number of children: 1   Years of education: 12   Highest education level: High school graduate  Occupational History   Occupation: Retired  Tobacco Use   Smoking status: Former    Packs/day: 1.00    Years: 30.00    Pack years: 30.00    Types: Cigarettes    Quit date: 05/08/2008    Years since quitting: 12.5   Smokeless tobacco: Never  Vaping Use   Vaping Use: Never used  Substance and Sexual Activity   Alcohol use: No   Drug use: No   Sexual activity: Never  Other Topics Concern   Not on file  Social History Narrative   Lives at home with wife.   Right-handed.   One cup caffeine per day.   Social Determinants of Health   Financial Resource Strain: Not on file  Food Insecurity: Not on file  Transportation Needs: Not on file  Physical Activity: Not on file  Stress: Not on file  Social Connections: Not on file  Intimate  Partner Violence: Not on file     BP 130/84   Pulse 88   Ht 6\' 1"  (1.854 m)   Wt 217 lb 12.8 oz (98.8 kg)   SpO2 94%   BMI 28.74 kg/m   Physical Exam:  Well appearing NAD HEENT: Unremarkable Neck:  No JVD, no thyromegally Lymphatics:  No adenopathy Back:  No CVA tenderness Lungs:  Clear with no wheezes HEART:  Regular rate rhythm, no murmurs, no rubs, no clicks Abd:  soft, positive bowel sounds, no organomegally, no rebound, no guarding Ext:  2 plus pulses, no edema, no cyanosis, no clubbing Skin:  No rashes no nodules Neuro:  CN II through XII intact, motor grossly intact  EKG - nsr  DEVICE  Normal device function.  See PaceArt for details.   Assess/Plan:  1. [place] syncope - he has had no recurrent syncope since his PPM insertion. 2. PPM - his medtronic DDD PM is working normally. 3. Wide qrs tachy - he has had a single episode since his last check. Looks like SVT with abherrancy 4. Dyslipidemia - he will continue his statin therapy.  Carleene Overlie Keinan Brouillet,MD

## 2020-12-07 NOTE — Progress Notes (Signed)
Cardiology Office Note:   Date:  12/08/2020  NAME:  Peter Marsh    MRN: [id number] DOB:  07/07/1960   PCP:  Vevelyn Francois, NP  Cardiologist:  Sanda Klein, MD  Electrophysiologist:  Cristopher Peru, MD   Referring MD: Vevelyn Francois, NP   Chief Complaint  Patient presents with   Follow-up   History of Present Illness:   Peter Marsh is a 60 y.o. male with a hx of CKD IV, HTN, Aflutter (no AC due to GI bleed), COPD, AAA, SSS s/p ppm who presents for follow-up. Admission in June for SBO.  He reports he is doing well.  No symptoms.  Recent admission for SBO.  Symptoms have resolved.  He did not require surgery.  BP 124/78.  Pulse 86.  Recent visit by EP shows his device is functioning well.  No refills needed.  He reports he takes Lasix 20 mg daily and this does a good job.  He reports kidney function has been stable.  He follows with Dr. Hollie Salk.  Denies any symptoms today.  Found to have a AAA on recent CT scan.  Needs a follow-up ultrasound in 1 year.  Problem List 1. Schizophrenia 2. HTN 3. CKD IV -Creatinine 2.38 4. Atrial flutter -no AC due to GI bleed   5. High grade conduction (HV ~70 msec) disease s/p dual chamber ppm (medtronic) 6. HLD -T chol 154, HDL 25, LDL 98, triglycerides 176 7. Right ureter narrowing 8. COPD 9. AAA -3.2 cm 10/21/2020 (CTA)  Past Medical History: Past Medical History:  Diagnosis Date   Allergy    Anxiety    Asthma    uses inhalers    Bilateral carotid bruits    Cardiac conduction disorder 2018   s/p MDT PPM   Cataract    Chronic kidney disease    bladder interstial cystitis    Chronic kidney disease (CKD), stage IV (severe) ([place])    followed by Dr. Joelyn Oms at [facility]   COPD (chronic obstructive pulmonary disease) ([place])    Depression    Encounter for care of pacemaker 02/13/2019   GERD (gastroesophageal reflux disease)    History of stomach ulcers 2001   Hypertension    LBBB (left bundle branch block)    Lower extremity  edema    Mild intermittent asthma without complication    Mixed hyperlipidemia    Mobitz type 2 second degree AV block 04/06/2019   Neuropathy    Pacemaker: Medtronic Azure XT DR MRI [id number] PPM -  BUNDLE OF HIS pacing  02/26/2017   Scheduled Remote pacemaker check  11/12/2018:  There were 24 Fast AV episodes:  EGMs show SVTs. Episodes lasted < 2 minutes. Health trends do not demonstrate significant abnormality. Battery longevity is 9.4 - 10.3 years. RA pacing is 47.1 %, RV pacing is 40.4 %.  Clinic check 11/07/17.    Paroxysmal atrial flutter ([name])    PONV (postoperative nausea and vomiting)    Prostatitis    Recurrent upper respiratory infection (URI)    Schizophrenia ([name])    Sinus node dysfunction ([place]) 02/13/2019   Urticaria     Past Surgical History: Past Surgical History:  Procedure Laterality Date   ADENOIDECTOMY     APPENDECTOMY     COLONOSCOPY     ELECTROPHYSIOLOGY STUDY N/A 02/26/2017   Procedure: ELECTROPHYSIOLOGY STUDY;  Surgeon: Evans Lance, MD;  Location: [facility];  Service: Cardiovascular;  Laterality: N/A;   ESOPHAGOGASTRODUODENOSCOPY (EGD) WITH PROPOFOL  N/A 09/18/2016   Procedure: ESOPHAGOGASTRODUODENOSCOPY (EGD) WITH PROPOFOL;  Surgeon: Ronnette Juniper, MD;  Location: [facility];  Service: Gastroenterology;  Laterality: N/A;   LUMBAR LAMINECTOMY/DECOMPRESSION MICRODISCECTOMY  04/19/2011   Procedure: LUMBAR LAMINECTOMY/DECOMPRESSION MICRODISCECTOMY;  Surgeon: Tobi Bastos;  Location: [facility];  Service: Orthopedics;  Laterality: Left;  Hemi LAminectomy/Microdiscectomy Lumbar four  - Lumbar five  on the Left (X-Ray)   PACEMAKER IMPLANT N/A 02/26/2017   Procedure: PACEMAKER IMPLANT;  Surgeon: Evans Lance, MD;  Location: [facility];  Service: Cardiovascular;  Laterality: N/A;   TONSILLECTOMY     UPPER GASTROINTESTINAL ENDOSCOPY      Current Medications: Current Meds  Medication Sig   acetaminophen (TYLENOL) 325 MG tablet Take 650 mg by mouth  every 6 (six) hours as needed for mild pain or headache.   albuterol (VENTOLIN HFA) 108 (90 Base) MCG/ACT inhaler Inhale 2 puffs into the lungs every 6 (six) hours as needed for wheezing or shortness of breath.   Budeson-Glycopyrrol-Formoterol (BREZTRI AEROSPHERE) 160-9-4.8 MCG/ACT AERO 2 puffs twice a day with a spacer   buPROPion (WELLBUTRIN SR) 150 MG 12 hr tablet Take 150 mg by mouth 2 (two) times daily.   cloNIDine (CATAPRES) 0.2 MG tablet Take 1 tablet (0.2 mg total) by mouth 3 (three) times daily. Please keep upcoming appt with Dr. Lovena Le in July 2022 before anymore refills. Thank you   Cyanocobalamin (VITAMIN B-12 PO) Take 1 tablet by mouth daily.   docusate sodium (COLACE) 100 MG capsule Take 1 capsule (100 mg total) by mouth 2 (two) times daily.   Fenofibrate 50 MG CAPS Take 1 capsule (50 mg total) by mouth daily.   fluticasone (FLONASE) 50 MCG/ACT nasal spray Place 1 spray into both nostrils daily as needed for allergies.   furosemide (LASIX) 20 MG tablet Take 1 tablet (20 mg total) by mouth daily.   gabapentin (NEURONTIN) 100 MG capsule Take 1 capsule (100 mg total) by mouth 3 (three) times daily.   hydrALAZINE (APRESOLINE) 50 MG tablet Take 1 tablet (50 mg total) by mouth 3 (three) times daily.   isosorbide dinitrate (ISORDIL) 30 MG tablet TAKE 1 TABLET(30 MG) BY MOUTH FOUR TIMES DAILY   metoprolol tartrate (LOPRESSOR) 100 MG tablet Take 1 tablet (100 mg total) by mouth 2 (two) times daily.   ondansetron (ZOFRAN ODT) 4 MG disintegrating tablet Take 1 tablet (4 mg total) by mouth every 8 (eight) hours as needed for nausea or vomiting.   paliperidone (INVEGA) 6 MG 24 hr tablet Take 6 mg by mouth at bedtime.   Pancrelipase, Lip-Prot-Amyl, (ZENPEP) 40000-126000 units CPEP Take 1 tablet by mouth with breakfast, with lunch, and with evening meal.   pantoprazole (PROTONIX) 40 MG tablet Take 1 tablet (40 mg total) by mouth 2 (two) times daily.   sucralfate (CARAFATE) 1 GM/10ML suspension Take  10 mLs (1 g total) by mouth 4 (four) times daily -  with meals and at bedtime.     Allergies:    Methylpyrrolidone, Niacin, Ace inhibitors, Norvasc [amlodipine besylate], Other, Oxybutynin chloride [oxybutynin chloride er], Vesicare [solifenacin succinate], Ciprofloxacin, Oxybutynin, and Solifenacin   Social History: Social History   Socioeconomic History   Marital status: Married    Spouse name: Not on file   Number of children: 1   Years of education: 12   Highest education level: High school graduate  Occupational History   Occupation: Retired  Tobacco Use   Smoking status: Former    Packs/day: 1.00    Years:  30.00    Pack years: 30.00    Types: Cigarettes    Quit date: 05/08/2008    Years since quitting: 12.5   Smokeless tobacco: Never  Vaping Use   Vaping Use: Never used  Substance and Sexual Activity   Alcohol use: No   Drug use: No   Sexual activity: Never  Other Topics Concern   Not on file  Social History Narrative   Lives at home with wife.   Right-handed.   One cup caffeine per day.   Social Determinants of Health   Financial Resource Strain: Not on file  Food Insecurity: Not on file  Transportation Needs: Not on file  Physical Activity: Not on file  Stress: Not on file  Social Connections: Not on file     Family History: The patient's family history includes Alzheimer's disease in his father; High blood pressure in his mother. There is no history of Colon cancer, Esophageal cancer, Rectal cancer, or Stomach cancer.  ROS:   All other ROS reviewed and negative. Pertinent positives noted in the HPI.     EKGs/Labs/Other Studies Reviewed:   The following studies were personally reviewed by me today:  Recent Labs: 10/25/2020: ALT 17; Magnesium 1.9 11/02/2020: BUN 18; Creatinine, Ser 2.14; Hemoglobin 11.6; Platelets 280; Potassium 5.0; Sodium 136   Recent Lipid Panel    Component Value Date/Time   CHOL 149 09/20/2020 0943   TRIG 119 09/20/2020 0943    HDL 26 (L) 09/20/2020 0943   CHOLHDL 5.7 (H) 09/20/2020 0943   CHOLHDL 6.8 11/20/2018 0432   VLDL 19 11/20/2018 0432   LDLCALC 101 (H) 09/20/2020 0943    Physical Exam:   VS:  BP 124/78   Pulse 86   Ht 6\' 1"  (1.854 m)   Wt 221 lb 12.8 oz (100.6 kg)   SpO2 97%   BMI 29.26 kg/m    Wt Readings from Last 3 Encounters:  12/08/20 221 lb 12.8 oz (100.6 kg)  11/30/20 217 lb 12.8 oz (98.8 kg)  11/04/20 226 lb 6 oz (102.7 kg)    General: Well nourished, well developed, in no acute distress Head: Atraumatic, normal size  Eyes: PEERLA, EOMI  Neck: Supple, no JVD Endocrine: No thryomegaly Cardiac: Normal S1, S2; RRR; no murmurs, rubs, or gallops Lungs: Clear to auscultation bilaterally, no wheezing, rhonchi or rales  Abd: Soft, nontender, no hepatomegaly  Ext: No edema, pulses 2+ Musculoskeletal: No deformities, BUE and BLE strength normal and equal Skin: Warm and dry, no rashes   Neuro: Alert and oriented to person, place, time, and situation, CNII-XII grossly intact, no focal deficits  Psych: Normal mood and affect   ASSESSMENT:   Peter Marsh is a 60 y.o. male who presents for the following: 1. Sinus node dysfunction ([name])   2. Atrial flutter, unspecified type ([place])   3. Mixed hyperlipidemia   4. Essential hypertension   5. AAA (abdominal aortic aneurysm) without rupture ([place])     PLAN:   1. Sinus node dysfunction ([name]) -Status post pacemaker implantation.  Functioning well.  No issues.  2. Atrial flutter, unspecified type ([place]) -History of atrial flutter in the past.  Maintaining sinus rhythm.  No anticoagulation due to prior history of GI bleed.  3. Mixed hyperlipidemia -Most recent LDL cholesterol 101.  He is on Zetia 10 mg daily.  4. Essential hypertension -Well-controlled.  No change to medications.  5. AAA (abdominal aortic aneurysm) without rupture ([name]) -3.2 cm.  Found on CTA in the hospital.  We will repeat a abdominal ultrasound in 1 year.  He will see  [place] back in 1 year.  Disposition: Return in about 1 year (around 12/08/2021).  Medication Adjustments/Labs and Tests Ordered: Current medicines are reviewed at length with the patient today.  Concerns regarding medicines are outlined above.  No orders of the defined types were placed in this encounter.  No orders of the defined types were placed in this encounter.   Patient Instructions   Follow-Up: At [facility], you and your health needs are our priority.  As part of our continuing mission to provide you with exceptional heart care, we have created designated Provider Care Teams.  These Care Teams include your primary Cardiologist (physician) and Advanced Practice Providers (APPs -  Physician Assistants and Nurse Practitioners) who all work together to provide you with the care you need, when you need it.  We recommend signing up for the patient portal called "MyChart".  Sign up information is provided on this After Visit Summary.  MyChart is used to connect with patients for Virtual Visits (Telemedicine).  Patients are able to view lab/test results, encounter notes, upcoming appointments, etc.  Non-urgent messages can be sent to your provider as well.   To learn more about what you can do with MyChart, go to [email or web address].    Your next appointment:   12 month(s)  The format for your next appointment:   In Person  Provider:   Eleonore Chiquito, MD     Time Spent with Patient: I have spent a total of 35 minutes with patient reviewing hospital notes, telemetry, EKGs, labs and examining the patient as well as establishing an assessment and plan that was discussed with the patient.  > 50% of time was spent in direct patient care.  Signed, Addison Naegeli. Audie Box, MD, [place]  [address] [place],  [address] [phone]  12/08/2020 8:24 AM

## 2020-12-08 ENCOUNTER — Other Ambulatory Visit: Payer: Self-pay

## 2020-12-08 ENCOUNTER — Ambulatory Visit (INDEPENDENT_AMBULATORY_CARE_PROVIDER_SITE_OTHER): Payer: PPO | Admitting: Cardiovascular Disease

## 2020-12-08 ENCOUNTER — Encounter: Payer: Self-pay | Admitting: Cardiovascular Disease

## 2020-12-08 VITALS — BP 124/78 | HR 86 | Ht 73.0 in | Wt 221.8 lb

## 2020-12-08 DIAGNOSIS — I714 Abdominal aortic aneurysm, without rupture, unspecified: Secondary | ICD-10-CM

## 2020-12-08 DIAGNOSIS — I10 Essential (primary) hypertension: Secondary | ICD-10-CM

## 2020-12-08 DIAGNOSIS — E782 Mixed hyperlipidemia: Secondary | ICD-10-CM | POA: Diagnosis not present

## 2020-12-08 DIAGNOSIS — I4892 Unspecified atrial flutter: Secondary | ICD-10-CM | POA: Diagnosis not present

## 2020-12-08 DIAGNOSIS — I495 Sick sinus syndrome: Secondary | ICD-10-CM

## 2020-12-08 NOTE — Progress Notes (Signed)
[place]  [address] Dept: [phone]  FOLLOW UP NOTE  Patient ID: Peter Marsh, male    DOB: 1961-04-05  Age: 60 y.o. MRN: [id number] Date of Office Visit: 12/09/2020  Assessment  Chief Complaint: Asthma (ACT -25 ) and Cough (Some coughing )  HPI Peter Marsh is a 60 year old male who presents to the clinic for follow-up visit.  He was last seen in this clinic on 09/08/2020 for evaluation of asthma/COPD overlap syndrome, allergic rhinitis, and reflux.  His past medical history includes kidney disease, Barrett's esophagus, and a dual-chamber pacemaker. At today's visit, he reports his asthma/COPD overlap syndrome has been well controlled with no shortness of breath, cough, or wheeze with activity or rest.  He reports that he has been able to increase his activity and is walking frequently at this time.  He also reports a significant intentional weight loss.  He continues Breztri 2 puffs twice a day with a spacer and uses albuterol about once a month.  He reports he stopped taking montelukast several months ago as he was out of medication at that time.  Allergic rhinitis is reported as well controlled at this time with no medical intervention.  He does report symptoms occur during pollen season for which he uses cetirizine and Flonase as needed with relief of symptoms.  Reflux is reported as well controlled with no symptoms including vomiting or heartburn.  He continues pantoprazole twice a day and Carafate as needed.  He continues to follow gastroenterologist, Dr. Tarri Glenn.  His current medications are listed in the chart.   Drug Allergies:  Allergies  Allergen Reactions   Methylpyrrolidone Hives    froze the intestine   Niacin Itching, Nausea And Vomiting and Other (See Comments)    Flushing, itching, tingling    Ace Inhibitors     Other reaction(s): CKD 4   Norvasc [Amlodipine Besylate] Other (See Comments)    Swollen Feet   Other Other (See Comments)    Oxybutynin Chloride [Oxybutynin Chloride Er] Other (See Comments)    froze the intestine   Vesicare [Solifenacin Succinate] Other (See Comments)    Froze the intestine    Ciprofloxacin Rash and Other (See Comments)    Felt flushed    Oxybutynin Rash and Other (See Comments)    bowel obst   Solifenacin Rash    Physical Exam: BP 130/76   Pulse 62   Temp (!) 97.3 F (36.3 C)   Resp 16   Ht 6\' 1"  (1.854 m)   Wt 219 lb 12.8 oz (99.7 kg)   SpO2 95%   BMI 29.00 kg/m    Physical Exam Vitals reviewed.  Constitutional:      Appearance: Normal appearance.  HENT:     Head: Normocephalic and atraumatic.     Right Ear: Tympanic membrane normal.     Left Ear: Tympanic membrane normal.     Nose:     Comments: Bilateral nares normal.  Pharynx normal.  Ears normal.  Eyes normal.    Mouth/Throat:     Pharynx: Oropharynx is clear.  Eyes:     Conjunctiva/sclera: Conjunctivae normal.  Cardiovascular:     Rate and Rhythm: Normal rate and regular rhythm.     Heart sounds: Normal heart sounds. No murmur heard. Pulmonary:     Effort: Pulmonary effort is normal.     Breath sounds: Normal breath sounds.     Comments: Lungs clear to auscultation Musculoskeletal:  General: Normal range of motion.     Cervical back: Normal range of motion and neck supple.  Skin:    General: Skin is warm and dry.  Neurological:     Mental Status: He is alert and oriented to person, place, and time.  Psychiatric:        Mood and Affect: Mood normal.        Behavior: Behavior normal.        Thought Content: Thought content normal.        Judgment: Judgment normal.    Diagnostics: FVC 1.81, FEV1 1.34.  Predicted FVC 4.88, predicted FEV1 3.69.  Spirometry indicates severe restriction.  Postbronchodilator FVC 1.55, FEV1 1.51.  Postbronchodilator spirometry indicates no significant bronchodilator response.   Assessment and Plan: 1. Asthma-COPD overlap syndrome ([place])   2. Seasonal and perennial  allergic rhinitis   3. Gastroesophageal reflux disease, unspecified whether esophagitis present     Meds ordered this encounter  Medications   Budeson-Glycopyrrol-Formoterol (BREZTRI AEROSPHERE) 160-9-4.8 MCG/ACT AERO    Sig: Inhale 2 puffs into the lungs in the morning and at bedtime.    Dispense:  10.7 g    Refill:  5   montelukast (SINGULAIR) 10 MG tablet    Sig: Take 1 tablet (10 mg total) by mouth at bedtime.    Dispense:  90 tablet    Refill:  1   cetirizine (ZYRTEC) 10 MG tablet    Sig: Take 1 tablet (10 mg total) by mouth daily.    Dispense:  30 tablet    Refill:  5   albuterol (VENTOLIN HFA) 108 (90 Base) MCG/ACT inhaler    Sig: Inhale 2 puffs into the lungs every 6 (six) hours as needed for wheezing or shortness of breath.    Dispense:  18 g    Refill:  1     Patient Instructions  Ashtma/COPD Restart montelukast 10 mg once a day to prevent cough or wheeze Continue Brezrti-2 puffs twice a day with a spacer to prevent cough or wheeze Continue albuterol 2 puffs once every 4 hours as needed for cough or wheeze Can consider biologic therapy if no improvement Suggest referral to pulmonology if no improvement with montelukast at the next visit.   Allergic rhinitis Continue allergen avoidance measures directed toward pollen, mold, dust mite, and dog as listed below Continue cetirizine 10 mg once a day as needed for runny nose or itch Continue Flonase 2 sprays in each nostril once a day as needed for stuffy nose Consider saline nasal rinses as needed for nasal symptoms. Use this before any medicated nasal sprays for best result  Reflux Continue dietary and lifestyle modifications as listed below Continue your current medications as prescribed by Dr. Modena Nunnery  Call the clinic if this treatment plan is not working well for you  Follow up in 3 months or sooner if needed.   Return in about 3 months (around 03/11/2021), or if symptoms worsen or fail to improve.    Thank  you for the opportunity to care for this patient.  Please do not hesitate to contact me with questions.  Gareth Morgan, FNP [facility]

## 2020-12-08 NOTE — Patient Instructions (Signed)
  Follow-Up: At [facility], you and your health needs are our priority.  As part of our continuing mission to provide you with exceptional heart care, we have created designated Provider Care Teams.  These Care Teams include your primary Cardiologist (physician) and Advanced Practice Providers (APPs -  Physician Assistants and Nurse Practitioners) who all work together to provide you with the care you need, when you need it.  We recommend signing up for the patient portal called "MyChart".  Sign up information is provided on this After Visit Summary.  MyChart is used to connect with patients for Virtual Visits (Telemedicine).  Patients are able to view lab/test results, encounter notes, upcoming appointments, etc.  Non-urgent messages can be sent to your provider as well.   To learn more about what you can do with MyChart, go to [email or web address].    Your next appointment:   12 month(s)  The format for your next appointment:   In Person  Provider:   Price O'Neal, MD    

## 2020-12-08 NOTE — Patient Instructions (Addendum)
Ashtma/COPD Restart montelukast 10 mg once a day to prevent cough or wheeze Continue Brezrti-2 puffs twice a day with a spacer to prevent cough or wheeze Continue albuterol 2 puffs once every 4 hours as needed for cough or wheeze You may use albuterol 2 puffs 5-15 minutes before activity to decrease cough or wheeze Can consider biologic therapy if no improvement Suggest referral to pulmonology if no improvement with montelukast at the next visit.   Allergic rhinitis Continue allergen avoidance measures directed toward pollen, mold, dust mite, and dog as listed below Continue cetirizine 10 mg once a day as needed for runny nose or itch Continue Flonase 2 sprays in each nostril once a day as needed for stuffy nose Consider saline nasal rinses as needed for nasal symptoms. Use this before any medicated nasal sprays for best result  Reflux Continue dietary and lifestyle modifications as listed below Continue your current medications as prescribed by Dr. Modena Nunnery  Call the clinic if this treatment plan is not working well for you  Follow up in 3 months or sooner if needed.  Reducing Pollen Exposure The American Academy of Allergy, Asthma and Immunology suggests the following steps to reduce your exposure to pollen during allergy seasons. Do not hang sheets or clothing out to dry; pollen may collect on these items. Do not mow lawns or spend time around freshly cut grass; mowing stirs up pollen. Keep windows closed at night.  Keep car windows closed while driving. Minimize morning activities outdoors, a time when pollen counts are usually at their highest. Stay indoors as much as possible when pollen counts or humidity is high and on windy days when pollen tends to remain in the air longer. Use air conditioning when possible.  Many air conditioners have filters that trap the pollen spores. Use a HEPA room air filter to remove pollen form the indoor air you breathe.  Control of Mold  Allergen Mold and fungi can grow on a variety of surfaces provided certain temperature and moisture conditions exist.  Outdoor molds grow on plants, decaying vegetation and soil.  The major outdoor mold, Alternaria and Cladosporium, are found in very high numbers during hot and dry conditions.  Generally, a late Summer - Fall peak is seen for common outdoor fungal spores.  Rain will temporarily lower outdoor mold spore count, but counts rise rapidly when the rainy period ends.  The most important indoor molds are Aspergillus and Penicillium.  Dark, humid and poorly ventilated basements are ideal sites for mold growth.  The next most common sites of mold growth are the bathroom and the kitchen.  Outdoor [employer] Use air conditioning and keep windows closed Avoid exposure to decaying vegetation. Avoid leaf raking. Avoid grain handling. Consider wearing a face mask if working in moldy areas.  Indoor Mold Control Maintain humidity below 50%. Clean washable surfaces with 5% bleach solution. Remove sources e.g. Contaminated carpets.  Control of Dog or Cat Allergen Avoidance is the best way to manage a dog or cat allergy. If you have a dog or cat and are allergic to dog or cats, consider removing the dog or cat from the home. If you have a dog or cat but don't want to find it a new home, or if your family wants a pet even though someone in the household is allergic, here are some strategies that may help keep symptoms at bay:  Keep the pet out of your bedroom and restrict it to only a few rooms. Be  advised that keeping the dog or cat in only one room will not limit the allergens to that room. Don't pet, hug or kiss the dog or cat; if you do, wash your hands with soap and water. High-efficiency particulate air (HEPA) cleaners run continuously in a bedroom or living room can reduce allergen levels over time. Regular use of a high-efficiency vacuum cleaner or a central vacuum can reduce allergen  levels. Giving your dog or cat a bath at least once a week can reduce airborne allergen.   Control of Dust Mite Allergen Dust mites play a major role in allergic asthma and rhinitis. They occur in environments with high humidity wherever human skin is found. Dust mites absorb humidity from the atmosphere (ie, they do not drink) and feed on organic matter (including shed human and animal skin). Dust mites are a microscopic type of insect that you cannot see with the naked eye. High levels of dust mites have been detected from mattresses, pillows, carpets, upholstered furniture, bed covers, clothes, soft toys and any woven material. The principal allergen of the dust mite is found in its feces. A gram of dust may contain 1,000 mites and 250,000 fecal particles. Mite antigen is easily measured in the air during house cleaning activities. Dust mites do not bite and do not cause harm to humans, other than by triggering allergies/asthma.  Ways to decrease your exposure to dust mites in your home:  1. Encase mattresses, box springs and pillows with a mite-impermeable barrier or cover  2. Wash sheets, blankets and drapes weekly in hot water (130 F) with detergent and dry them in a dryer on the hot setting.  3. Have the room cleaned frequently with a vacuum cleaner and a damp dust-mop. For carpeting or rugs, vacuuming with a vacuum cleaner equipped with a high-efficiency particulate air (HEPA) filter. The dust mite allergic individual should not be in a room which is being cleaned and should wait 1 hour after cleaning before going into the room.  4. Do not sleep on upholstered furniture (eg, couches).  5. If possible removing carpeting, upholstered furniture and drapery from the home is ideal. Horizontal blinds should be eliminated in the rooms where the person spends the most time (bedroom, study, television room). Washable vinyl, roller-type shades are optimal.  6. Remove all non-washable stuffed toys  from the bedroom. Wash stuffed toys weekly like sheets and blankets above.  7. Reduce indoor humidity to less than 50%. Inexpensive humidity monitors can be purchased at most hardware stores. Do not use a humidifier as can make the problem worse and are not recommended.   Lifestyle Changes for Controlling GERD When you have GERD, stomach acid feels as if it's backing up toward your mouth. Whether or not you take medication to control your GERD, your symptoms can often be improved with lifestyle changes.   Raise Your Head Reflux is more likely to strike when you're lying down flat, because stomach fluid can flow backward more easily. Raising the head of your bed 4-6 inches can help. To do this: Slide blocks or books under the legs at the head of your bed. Or, place a wedge under the mattress. Many foam stores can make a suitable wedge for you. The wedge should run from your waist to the top of your head. Don't just prop your head on several pillows. This increases pressure on your stomach. It can make GERD worse.  Watch Your Eating Habits Certain foods may increase the acid in  your stomach or relax the lower esophageal sphincter, making GERD more likely. It's best to avoid the following: Coffee, tea, and carbonated drinks (with and without caffeine) Fatty, fried, or spicy food Mint, chocolate, onions, and tomatoes Any other foods that seem to irritate your stomach or cause you pain  Relieve the Pressure Eat smaller meals, even if you have to eat more often. Don't lie down right after you eat. Wait a few hours for your stomach to empty. Avoid tight belts and tight-fitting clothes. Lose excess weight.  Tobacco and Alcohol Avoid smoking tobacco and drinking alcohol. They can make GERD symptoms worse.

## 2020-12-09 ENCOUNTER — Encounter: Payer: Self-pay | Admitting: Family Medicine

## 2020-12-09 ENCOUNTER — Ambulatory Visit (INDEPENDENT_AMBULATORY_CARE_PROVIDER_SITE_OTHER): Payer: PPO | Admitting: Family Medicine

## 2020-12-09 VITALS — BP 130/76 | HR 62 | Temp 97.3°F | Resp 16 | Ht 73.0 in | Wt 219.8 lb

## 2020-12-09 DIAGNOSIS — K219 Gastro-esophageal reflux disease without esophagitis: Secondary | ICD-10-CM | POA: Diagnosis not present

## 2020-12-09 DIAGNOSIS — J449 Chronic obstructive pulmonary disease, unspecified: Secondary | ICD-10-CM | POA: Diagnosis not present

## 2020-12-09 DIAGNOSIS — J3089 Other allergic rhinitis: Secondary | ICD-10-CM

## 2020-12-09 DIAGNOSIS — J302 Other seasonal allergic rhinitis: Secondary | ICD-10-CM | POA: Diagnosis not present

## 2020-12-09 MED ORDER — ALBUTEROL SULFATE HFA 108 (90 BASE) MCG/ACT IN AERS: 2.0000 | INHALATION_SPRAY | Freq: Four times a day (QID) | RESPIRATORY_TRACT | 1 refills | Status: DC | PRN

## 2020-12-09 MED ORDER — CETIRIZINE HCL 10 MG PO TABS: 10.0000 mg | ORAL_TABLET | Freq: Every day | ORAL | 5 refills | Status: DC

## 2020-12-09 MED ORDER — BREZTRI AEROSPHERE 160-9-4.8 MCG/ACT IN AERO: 2.0000 | INHALATION_SPRAY | Freq: Two times a day (BID) | RESPIRATORY_TRACT | 5 refills | Status: DC

## 2020-12-09 MED ORDER — MONTELUKAST SODIUM 10 MG PO TABS: 10.0000 mg | ORAL_TABLET | Freq: Every day | ORAL | 1 refills | Status: DC

## 2020-12-17 ENCOUNTER — Telehealth: Payer: Self-pay | Admitting: Gastroenterology

## 2020-12-17 ENCOUNTER — Telehealth: Payer: Self-pay | Admitting: Family Medicine

## 2020-12-17 ENCOUNTER — Ambulatory Visit (INDEPENDENT_AMBULATORY_CARE_PROVIDER_SITE_OTHER): Payer: PPO | Admitting: Gastroenterology

## 2020-12-17 ENCOUNTER — Encounter: Payer: Self-pay | Admitting: Gastroenterology

## 2020-12-17 VITALS — BP 126/82 | HR 68 | Ht 73.0 in | Wt 219.2 lb

## 2020-12-17 DIAGNOSIS — R109 Unspecified abdominal pain: Secondary | ICD-10-CM | POA: Diagnosis not present

## 2020-12-17 DIAGNOSIS — K56609 Unspecified intestinal obstruction, unspecified as to partial versus complete obstruction: Secondary | ICD-10-CM

## 2020-12-17 MED ORDER — ONDANSETRON 4 MG PO TBDP: 4.0000 mg | ORAL_TABLET | Freq: Three times a day (TID) | ORAL | 0 refills | Status: DC | PRN

## 2020-12-17 MED ORDER — PLENVU 140 G PO SOLR: 140.0000 g | ORAL | 0 refills | Status: DC

## 2020-12-17 NOTE — Telephone Encounter (Signed)
Pt will come to the office to pick up the free sample kit.

## 2020-12-17 NOTE — Patient Instructions (Addendum)
If you are age 62 or older, your body mass index should be between 23-30. Your Body mass index is 28.93 kg/m. If this is out of the aforementioned range listed, please consider follow up with your Primary Care Provider.  If you are age 12 or younger, your body mass index should be between 19-25. Your Body mass index is 28.93 kg/m. If this is out of the aformentioned range listed, please consider follow up with your Primary Care Provider.   __________________________________________________________  The [place] providers would like to encourage you to use [facility] to communicate with providers for non-urgent requests or questions.  Due to long hold times on the telephone, sending your provider a message by [facility] may be a faster and more efficient way to get a response.  Please allow 48 business hours for a response.  Please remember that this is for non-urgent requests.    It is time for your colonoscopy. I am recommending a bowel prep over a couple of days to be sure that we get you good and cleaned out for a complete exam. I have recommended some anti-nausea medication to be used prior to each dose of laxative while you are getting ready for your colonoscopy.   Additional tips for colonoscopy:  - Stay well hydrated for 3-4 days prior to the exam. This reduces nausea and dehydration.  - To prevent skin/hemorrhoid irritation - prior to wiping, put A&Dointment or vaseline on the toilet paper. - Keep a towel or pad on the bed.  - Drink  64oz of clear liquids in the morning of prep day (prior to starting the prep) to be sure that there is enough fluid to flush the colon and stay hydrated!!!! This is in addition to the fluids required for preparation. - Use of a flavored hard candy, such as grape Anise Salvo, can counteract some of the flavor of the prep and may prevent some nausea.    Thank you for trusting me with your gastrointestinal care!    Thornton Park, MD, MPH

## 2020-12-17 NOTE — Telephone Encounter (Signed)
Reviewed the spirometry.  It looks like when he was seen in May 2022 he had an FEV1 of 1.64 (44%) and an FVC of 2.18 (45%).    However, when she saw him last week, he had an FEV1 of 1.15 L (31%) and an FVC of 1.55 L (32%).  Therefore, both of his lung values have decreased.  It looks like she recommended restarting the montelukast to see if this would help.  Peter Marsh is also considering adding a biologic or injectable medication to help manage his breathing.  He is going to follow-up in 3 months, so she will trend his breathing and see how he is doing.  We can certainly see him a lot sooner if needed.  I am not sure that would be a bad idea.  Why do not we reschedule him for 6 weeks or 8 weeks.  [name], MD [facility] [place] [facility] [place]

## 2020-12-17 NOTE — Telephone Encounter (Signed)
Patient was see 12/09/20 by Webb Silversmith. He said his spirometry was below normal and she would like to know what that means.

## 2020-12-17 NOTE — Telephone Encounter (Signed)
Inbound call from pharmacy stating Plenvu is not covered and is wanting to know if alternate prep can be sent to pharmacy.

## 2020-12-17 NOTE — Progress Notes (Signed)
Referring Provider: Vevelyn Francois, NP Primary Care Physician:  Vevelyn Francois, NP  Chief complaint: Chronic pancreatitis, Barrett's esophagus    IMPRESSION:  Recent hospitalization for enteritis with ileus versus SBO New onset anemia Chronic pancreatitis with associated abdominal pain    - improved on pancreatic enzymes Long-segment Barrett's Esophagus with esophageal ulcerations seen on EGD 2018    - no dysplasia on EGD 04/07/20 Intestinal metaplasia on gastric biopsies 04/07/20    - no family history of gastric cancer Chronic constipation with associated internal hemorrhoids History of colon polyps    - colonoscopy with Dr. Benson Norway 03/02/11: 3 tubular adenomas and a hyperplastic polyp Newly identified infrarenal aortic aneurysm  New onset anemia: Follow. Consider endoscopic evaluation with any overt or occult GI bleeding.   Recent enteritis with ileus versus SBO: Clinically resolved.   Chronic pancreatitis: Continue Creon.   Barrett's Esophagus: No dysplasia noted on biopsies 12/21. Continue pantoprazole and Carafate.   Gastritis with intestinal metaplasia on 12/21 EGD: No family history of gastric cancer.  Biopsies negative for H pylori. Will plan mapping with next EGD.   Intermittent rectal bleeding that the patient attributes to hemorrhoids: Resolved with normalization of bowel habits on pancreatic enzymes.  History of colon polyps: Surveillance due. He is concerned about prep-related nausea. Will plan Zofran SL prior to each dose of purgative. I've asked him to call the on-call physician with any difficulties to be sure that he has an adequate preparation for the procedure.    PLAN: - Diet recommendation: consume low-fat meals, eat multiple small meals, and avoid dehydration - Continue Creon, no dose changes today - Continue sucralfate and pantoprazole BID - Anemia labs at time of colonoscopy - Surveillance Colonoscopy given history of polyps - will use Zofran ODT 4mg   taken prior to each dose of purgative. Two 2 prep.  - Surveillance EGD for Barrett's 04/2023, will perform gastric mapping at that time, earlier if anemia persists and source not identified on colonoscopy - Office follow-up in 6 months, earlier as needed  Please see the "Patient Instructions" section for addition details about the plan.  HPI: CHIRSTOPHER Marsh is a 60 y.o. male who returns in follow-up after a recent hospitalization for small bowel obstruction. The interval history is obtained through the patient and review of his electronic health record. I have personal reviewed his labs, CT images, and surgical consultation notes.      At the time of his last office visit 06/02/20 his chronic symptoms of chronic pancreatitis, which are largely nausea, vomiting, and abdominal pain, had improved since resumingCreon. No steatorrhea, maldigestion, on known history of pancreatic pseudocyst, bile duct or duodenal obstruction.   Hospitalized for bowel obstruction in the setting of suicidal ideation and being COVID-positive. History of bowel obstruction in 2010.  He was treated conservatively and was symptoms free on discharge.   Now having a soft, formed bowel movement daily. Constipation and abdominal pain have resolved. No new complaints today.    Recent evaluation by allergist identified several allergies, seasonal and perennial allergic rhinitis, and asthma/COPD overlap syndrome.   Labs 11/02/20: normal CMP except for glucose 109, albumin 3.5. CBC showed a decline in hemoglobin to 11.6. Hemoglobin has been 13.6-15.8 over the last year.   Endoscopic history: - Colonoscopy Benson Norway) 03/02/2011:  1 cm transverse colon polyp, 2 6 to 7 mm sessile rectal polyps.  Pathology revealed 3 tubular adenomas and a hyperplastic polyp.  Surveillance recommended in 5 years.   - EGD 09/18/16 (  Therisa Doyne) for epigastric pain: antral inflammation; Barrett's esophagus with associated exudate/ulcer with the ulcer measuring 70mm -  EGD 04/07/20: 4cm Barrett's esophagus without dysphagia, gastritis and gastric intestinal metaplasia, normal duodenal biopsies  Prior abdominal imaging: - Gastric emptying study 2010: 30% retention at 2 hours - CT abd/pelvis without contrast 2015: Tiny calcifications throughout pancreas consistent with chronic  calcific pancreatitis. - CT abd/pelvis without contrast 2016: Diffuse speckled calcification within the pancreatic parenchyma is compatible with chronic pancreatitis. No dilatation of the main pancreatic duct. Kidney cysts - RUQ ultrasound 2018: normal gallbladder, CBD, liver, large cystic mass in the right kidney - Abdominal MRI to follow-up on renal lesions 2020: stomach and bowel mentioned as normal, abnormal kidneys - CT abd/pelvis without contrast 10/21/20: probable enteritis with mild ileus versus early obsruction, renal stones, aortic aneurysm  Father required gastric surgery for ulcers but did not have gastric cancer. No known family history of colon cancer or polyps. No family history of uterine/endometrial cancer, pancreatic cancer or gastric/stomach cancer.   Past Medical History:  Diagnosis Date   Allergy    Anxiety    Asthma    uses inhalers    Bilateral carotid bruits    Cardiac conduction disorder 2018   s/p MDT PPM   Cataract    Chronic kidney disease    bladder interstial cystitis    Chronic kidney disease (CKD), stage IV (severe) ([place])    followed by Dr. Joelyn Oms at [facility]   COPD (chronic obstructive pulmonary disease) ([place])    Depression    Encounter for care of pacemaker 02/13/2019   GERD (gastroesophageal reflux disease)    History of stomach ulcers 2001   Hypertension    LBBB (left bundle branch block)    Lower extremity edema    Mild intermittent asthma without complication    Mixed hyperlipidemia    Mobitz type 2 second degree AV block 04/06/2019   Neuropathy    Pacemaker: Medtronic Azure XT DR MRI [id number] PPM -  BUNDLE OF HIS pacing   02/26/2017   Scheduled Remote pacemaker check  11/12/2018:  There were 24 Fast AV episodes:  EGMs show SVTs. Episodes lasted < 2 minutes. Health trends do not demonstrate significant abnormality. Battery longevity is 9.4 - 10.3 years. RA pacing is 47.1 %, RV pacing is 40.4 %.  Clinic check 11/07/17.    Paroxysmal atrial flutter ([name])    PONV (postoperative nausea and vomiting)    Prostatitis    Recurrent upper respiratory infection (URI)    Schizophrenia ([name])    Sinus node dysfunction ([place]) 02/13/2019   Urticaria     Past Surgical History:  Procedure Laterality Date   ADENOIDECTOMY     APPENDECTOMY     COLONOSCOPY     ELECTROPHYSIOLOGY STUDY N/A 02/26/2017   Procedure: ELECTROPHYSIOLOGY STUDY;  Surgeon: Evans Lance, MD;  Location: [place] [facility];  Service: Cardiovascular;  Laterality: N/A;   ESOPHAGOGASTRODUODENOSCOPY (EGD) WITH PROPOFOL N/A 09/18/2016   Procedure: ESOPHAGOGASTRODUODENOSCOPY (EGD) WITH PROPOFOL;  Surgeon: Ronnette Juniper, MD;  Location: [facility];  Service: Gastroenterology;  Laterality: N/A;   LUMBAR LAMINECTOMY/DECOMPRESSION MICRODISCECTOMY  04/19/2011   Procedure: LUMBAR LAMINECTOMY/DECOMPRESSION MICRODISCECTOMY;  Surgeon: Tobi Bastos;  Location: [facility];  Service: Orthopedics;  Laterality: Left;  Hemi LAminectomy/Microdiscectomy Lumbar four  - Lumbar five  on the Left (X-Ray)   PACEMAKER IMPLANT N/A 02/26/2017   Procedure: PACEMAKER IMPLANT;  Surgeon: Evans Lance, MD;  Location: [place] [facility];  Service: Cardiovascular;  Laterality:  N/A;   TONSILLECTOMY     UPPER GASTROINTESTINAL ENDOSCOPY      Current Outpatient Medications  Medication Sig Dispense Refill   acetaminophen (TYLENOL) 325 MG tablet Take 650 mg by mouth every 6 (six) hours as needed for mild pain or headache.     albuterol (VENTOLIN HFA) 108 (90 Base) MCG/ACT inhaler Inhale 2 puffs into the lungs every 6 (six) hours as needed for wheezing or shortness of breath. 18 g 1    Budeson-Glycopyrrol-Formoterol (BREZTRI AEROSPHERE) 160-9-4.8 MCG/ACT AERO 2 puffs twice a day with a spacer 10.7 g 2   Budeson-Glycopyrrol-Formoterol (BREZTRI AEROSPHERE) 160-9-4.8 MCG/ACT AERO Inhale 2 puffs into the lungs in the morning and at bedtime. 10.7 g 5   buPROPion (WELLBUTRIN SR) 150 MG 12 hr tablet Take 150 mg by mouth 2 (two) times daily.     cetirizine (ZYRTEC) 10 MG tablet Take 1 tablet (10 mg total) by mouth daily. 30 tablet 5   cloNIDine (CATAPRES) 0.2 MG tablet Take 1 tablet (0.2 mg total) by mouth 3 (three) times daily. Please keep upcoming appt with Dr. Lovena Le in July 2022 before anymore refills. Thank you 90 tablet 0   Cyanocobalamin (VITAMIN B-12 PO) Take 1 tablet by mouth daily.     docusate sodium (COLACE) 100 MG capsule Take 1 capsule (100 mg total) by mouth 2 (two) times daily. 60 capsule 0   Fenofibrate 50 MG CAPS Take 1 capsule (50 mg total) by mouth daily. 30 capsule 11   fluticasone (FLONASE) 50 MCG/ACT nasal spray Place 1 spray into both nostrils daily as needed for allergies. 11.1 mL 3   furosemide (LASIX) 20 MG tablet Take 1 tablet (20 mg total) by mouth daily. 30 tablet 3   gabapentin (NEURONTIN) 100 MG capsule Take 1 capsule (100 mg total) by mouth 3 (three) times daily. 90 capsule 11   hydrALAZINE (APRESOLINE) 50 MG tablet Take 1 tablet (50 mg total) by mouth 3 (three) times daily. 270 tablet 1   isosorbide dinitrate (ISORDIL) 30 MG tablet TAKE 1 TABLET(30 MG) BY MOUTH FOUR TIMES DAILY 360 tablet 1   metoprolol tartrate (LOPRESSOR) 100 MG tablet Take 1 tablet (100 mg total) by mouth 2 (two) times daily. 180 tablet 1   montelukast (SINGULAIR) 10 MG tablet Take 1 tablet (10 mg total) by mouth at bedtime. 90 tablet 1   ondansetron (ZOFRAN ODT) 4 MG disintegrating tablet Take 1 tablet (4 mg total) by mouth every 8 (eight) hours as needed for nausea or vomiting. 20 tablet 0   paliperidone (INVEGA) 6 MG 24 hr tablet Take 6 mg by mouth at bedtime.     Pancrelipase,  Lip-Prot-Amyl, (ZENPEP) 40000-126000 units CPEP Take 1 tablet by mouth with breakfast, with lunch, and with evening meal. 90 capsule 2   pantoprazole (PROTONIX) 40 MG tablet Take 1 tablet (40 mg total) by mouth 2 (two) times daily. 180 tablet 3   sucralfate (CARAFATE) 1 GM/10ML suspension Take 10 mLs (1 g total) by mouth 4 (four) times daily -  with meals and at bedtime. 3600 mL 3   ezetimibe (ZETIA) 10 MG tablet Take 1 tablet (10 mg total) by mouth daily. 90 tablet 3   No current facility-administered medications for this visit.   Facility-Administered Medications Ordered in Other Visits  Medication Dose Route Frequency Provider Last Rate Last Admin   acetaminophen (OFIRMEV) IVPB    PRN Lissa Morales, CRNA   1,000 mg at 04/19/11 0910   glycopyrrolate (ROBINUL) injection  PRN Lissa Morales, CRNA   0.8 mg at 04/19/11 1037    Allergies as of 12/17/2020 - Review Complete 12/17/2020  Allergen Reaction Noted   Methylpyrrolidone Hives 07/21/2014   Niacin Itching, Nausea And Vomiting, and Other (See Comments) 08/30/2012   Ace inhibitors  10/23/2019   Norvasc [amlodipine besylate] Other (See Comments) 10/15/2017   Other Other (See Comments) 10/23/2019   Oxybutynin chloride [oxybutynin chloride er] Other (See Comments) 04/13/2011   Vesicare [solifenacin succinate] Other (See Comments) 04/13/2011   Ciprofloxacin Rash and Other (See Comments) 10/26/2010   Oxybutynin Rash and Other (See Comments) 10/09/2011   Solifenacin Rash 10/09/2011    Family History  Problem Relation Age of Onset   High blood pressure Mother    Alzheimer's disease Father    Colon cancer Neg Hx    Esophageal cancer Neg Hx    Rectal cancer Neg Hx    Stomach cancer Neg Hx       Physical Exam: General:   Alert,  well-nourished, pleasant and cooperative in NAD Head:  Normocephalic and atraumatic. Eyes:  Sclera clear, no icterus.   Conjunctiva pink. Ears:  Normal auditory acuity. Nose:  No deformity, discharge,  or  lesions. Mouth:  No deformity or lesions.   Neck:  Supple; no masses or thyromegaly. Lungs:  Clear throughout to auscultation.   No wheezes. Heart:  Regular rate and rhythm; no murmurs. Abdomen:  Soft, central obesity, ventral hernia, nontender, nondistended, normal bowel sounds, no rebound or guarding. No hepatosplenomegaly.   Rectal:  Deferred  Msk:  Symmetrical. No boney deformities LAD: No inguinal or umbilical LAD Extremities:  No clubbing or edema. Neurologic:  Alert and  oriented x4;  grossly nonfocal Skin:  Intact without significant lesions or rashes. Psych:  Alert and cooperative. Normal mood and affect.     Caroleen Stoermer L. Tarri Glenn, MD, MPH 12/17/2020, 10:28 AM

## 2020-12-17 NOTE — Telephone Encounter (Signed)
Please advise to this as Webb Silversmith is on vacation

## 2020-12-20 NOTE — Telephone Encounter (Signed)
Patient came in person with his wife to speak to someone about his results. Patient states he is feeling dizzy and has been having shortness of breath. Patient's wife explained he has not been feeling well lately and was concerned about patient's test. Patient then walked out stating he was not feeling well and wife asked if someone could give them a call today. I was not able to read Dr. Gillermina Hu message to them.   Best contact number: [phone]

## 2020-12-20 NOTE — Telephone Encounter (Signed)
Spoke with patient informed him of Dr. Gillermina Hu note. Patient verbalized understanding. Patient also informed me that he believes the way he has been feel is due to his medication and not his asthma. He stated that he wasn't feeling well this morning but is now feeling fine. I informed patient that if he felt like that again to call our office. I also recommend he follow up with his primary care regarding this matter as well. Patient verbalized understanding and stated that he has a follow up with his primary care soon.

## 2020-12-20 NOTE — Telephone Encounter (Signed)
Spoke with patient's wife, she stated that he just stepped out but would be back shortly. She is going to have him call me back once he gets back.

## 2020-12-27 ENCOUNTER — Ambulatory Visit (INDEPENDENT_AMBULATORY_CARE_PROVIDER_SITE_OTHER): Payer: PPO

## 2020-12-27 DIAGNOSIS — I495 Sick sinus syndrome: Secondary | ICD-10-CM | POA: Diagnosis not present

## 2020-12-27 LAB — CUP PACEART REMOTE DEVICE CHECK
Battery Remaining Longevity: 96 mo
Battery Voltage: 3 V
Brady Statistic AP VP Percent: 0.27 %
Brady Statistic AP VS Percent: 98.49 %
Brady Statistic AS VP Percent: 0 %
Brady Statistic AS VS Percent: 1.24 %
Brady Statistic RA Percent Paced: 98.77 %
Brady Statistic RV Percent Paced: 0.27 %
Date Time Interrogation Session: 20220822014504
Implantable Lead Implant Date: 20181022
Implantable Lead Implant Date: 20181022
Implantable Lead Location: 753859
Implantable Lead Location: 753860
Implantable Lead Model: 3830
Implantable Lead Model: 5076
Implantable Pulse Generator Implant Date: 20181022
Lead Channel Impedance Value: 323 Ohm
Lead Channel Impedance Value: 399 Ohm
Lead Channel Impedance Value: 456 Ohm
Lead Channel Impedance Value: 513 Ohm
Lead Channel Pacing Threshold Amplitude: 0.625 V
Lead Channel Pacing Threshold Amplitude: 1 V
Lead Channel Pacing Threshold Pulse Width: 0.4 ms
Lead Channel Pacing Threshold Pulse Width: 0.4 ms
Lead Channel Sensing Intrinsic Amplitude: 10.25 mV
Lead Channel Sensing Intrinsic Amplitude: 10.25 mV
Lead Channel Sensing Intrinsic Amplitude: 3.375 mV
Lead Channel Sensing Intrinsic Amplitude: 3.375 mV
Lead Channel Setting Pacing Amplitude: 1.5 V
Lead Channel Setting Pacing Amplitude: 3.5 V
Lead Channel Setting Pacing Pulse Width: 1 ms
Lead Channel Setting Sensing Sensitivity: 1.2 mV

## 2020-12-31 ENCOUNTER — Encounter: Payer: Self-pay | Admitting: Vascular Surgery

## 2020-12-31 ENCOUNTER — Ambulatory Visit (INDEPENDENT_AMBULATORY_CARE_PROVIDER_SITE_OTHER): Payer: PPO | Admitting: Vascular Surgery

## 2020-12-31 ENCOUNTER — Other Ambulatory Visit: Payer: Self-pay

## 2020-12-31 VITALS — BP 154/100 | HR 101 | Temp 97.2°F | Resp 18 | Ht 73.0 in | Wt 216.0 lb

## 2020-12-31 DIAGNOSIS — I714 Abdominal aortic aneurysm, without rupture, unspecified: Secondary | ICD-10-CM

## 2020-12-31 NOTE — Progress Notes (Signed)
Patient ID: Peter Marsh, male   DOB: 11-30-1960, 60 y.o.   MRN: [id number]  Reason for Consult: New Patient (Initial Visit) and AAA (AAA)   Referred by Thurnell Lose, MD  Subjective:     HPI:  Peter Marsh is a 60 y.o. male history of chronic kidney disease recently admitted with COVID-19.  Noncontrasted CT scan for abdominal pain demonstrated abdominal aortic aneurysm.  He has been diagnosed with bilateral carotid bruits in the past with no carotid stenosis.  Denies any history of stroke, TIA or amaurosis.  Risk factors for aneurysm do include hypertension and previous smoking history.  He does not have any family history of aneurysm disease.  Patient does have 1 son in his 69s.  His ex-wife actually died from an aneurysm rupture.  He has no new back or abdominal pain.  Past Medical History:  Diagnosis Date   Allergy    Anxiety    Asthma    uses inhalers    Bilateral carotid bruits    Cardiac conduction disorder 2018   s/p MDT PPM   Cataract    Chronic kidney disease    bladder interstial cystitis    Chronic kidney disease (CKD), stage IV (severe) ([place])    followed by Dr. Joelyn Oms at [facility]   COPD (chronic obstructive pulmonary disease) ([place])    Depression    Encounter for care of pacemaker 02/13/2019   GERD (gastroesophageal reflux disease)    History of stomach ulcers 2001   Hypertension    LBBB (left bundle branch block)    Lower extremity edema    Mild intermittent asthma without complication    Mixed hyperlipidemia    Mobitz type 2 second degree AV block 04/06/2019   Neuropathy    Pacemaker: Medtronic Azure XT DR MRI [id number] PPM -  BUNDLE OF HIS pacing  02/26/2017   Scheduled Remote pacemaker check  11/12/2018:  There were 24 Fast AV episodes:  EGMs show SVTs. Episodes lasted < 2 minutes. Health trends do not demonstrate significant abnormality. Battery longevity is 9.4 - 10.3 years. RA pacing is 47.1 %, RV pacing is 40.4 %.  Clinic check 11/07/17.     Paroxysmal atrial flutter ([name])    PONV (postoperative nausea and vomiting)    Prostatitis    Recurrent upper respiratory infection (URI)    Schizophrenia ([name])    Sinus node dysfunction ([place]) 02/13/2019   Urticaria    Family History  Problem Relation Age of Onset   High blood pressure Mother    Alzheimer's disease Father    Colon cancer Neg Hx    Esophageal cancer Neg Hx    Rectal cancer Neg Hx    Stomach cancer Neg Hx    Past Surgical History:  Procedure Laterality Date   ADENOIDECTOMY     APPENDECTOMY     COLONOSCOPY     ELECTROPHYSIOLOGY STUDY N/A 02/26/2017   Procedure: ELECTROPHYSIOLOGY STUDY;  Surgeon: Evans Lance, MD;  Location: [facility];  Service: Cardiovascular;  Laterality: N/A;   ESOPHAGOGASTRODUODENOSCOPY (EGD) WITH PROPOFOL N/A 09/18/2016   Procedure: ESOPHAGOGASTRODUODENOSCOPY (EGD) WITH PROPOFOL;  Surgeon: Ronnette Juniper, MD;  Location: [facility];  Service: Gastroenterology;  Laterality: N/A;   LUMBAR LAMINECTOMY/DECOMPRESSION MICRODISCECTOMY  04/19/2011   Procedure: LUMBAR LAMINECTOMY/DECOMPRESSION MICRODISCECTOMY;  Surgeon: Tobi Bastos;  Location: [facility];  Service: Orthopedics;  Laterality: Left;  Hemi LAminectomy/Microdiscectomy Lumbar four  - Lumbar five  on the Left (X-Ray)   PACEMAKER IMPLANT N/A  02/26/2017   Procedure: PACEMAKER IMPLANT;  Surgeon: Evans Lance, MD;  Location: [facility];  Service: Cardiovascular;  Laterality: N/A;   TONSILLECTOMY     UPPER GASTROINTESTINAL ENDOSCOPY      Short Social History:  Social History   Tobacco Use   Smoking status: Former    Packs/day: 1.00    Years: 30.00    Pack years: 30.00    Types: Cigarettes    Quit date: 05/08/2008    Years since quitting: 12.6   Smokeless tobacco: Never  Substance Use Topics   Alcohol use: No    Allergies  Allergen Reactions   Methylpyrrolidone Hives    froze the intestine   Niacin Itching, Nausea And Vomiting and Other (See Comments)    Flushing,  itching, tingling    Ace Inhibitors     Other reaction(s): CKD 4   Norvasc [Amlodipine Besylate] Other (See Comments)    Swollen Feet   Other Other (See Comments)   Oxybutynin Chloride [Oxybutynin Chloride Er] Other (See Comments)    froze the intestine   Vesicare [Solifenacin Succinate] Other (See Comments)    Froze the intestine    Ciprofloxacin Rash and Other (See Comments)    Felt flushed    Oxybutynin Rash and Other (See Comments)    bowel obst   Solifenacin Rash    Current Outpatient Medications  Medication Sig Dispense Refill   acetaminophen (TYLENOL) 325 MG tablet Take 650 mg by mouth every 6 (six) hours as needed for mild pain or headache.     albuterol (VENTOLIN HFA) 108 (90 Base) MCG/ACT inhaler Inhale 2 puffs into the lungs every 6 (six) hours as needed for wheezing or shortness of breath. 18 g 1   Budeson-Glycopyrrol-Formoterol (BREZTRI AEROSPHERE) 160-9-4.8 MCG/ACT AERO 2 puffs twice a day with a spacer 10.7 g 2   Budeson-Glycopyrrol-Formoterol (BREZTRI AEROSPHERE) 160-9-4.8 MCG/ACT AERO Inhale 2 puffs into the lungs in the morning and at bedtime. 10.7 g 5   buPROPion (WELLBUTRIN SR) 150 MG 12 hr tablet Take 150 mg by mouth 2 (two) times daily.     cetirizine (ZYRTEC) 10 MG tablet Take 1 tablet (10 mg total) by mouth daily. 30 tablet 5   cloNIDine (CATAPRES) 0.2 MG tablet Take 1 tablet (0.2 mg total) by mouth 3 (three) times daily. Please keep upcoming appt with Dr. Lovena Le in July 2022 before anymore refills. Thank you 90 tablet 0   Cyanocobalamin (VITAMIN B-12 PO) Take 1 tablet by mouth daily.     docusate sodium (COLACE) 100 MG capsule Take 1 capsule (100 mg total) by mouth 2 (two) times daily. 60 capsule 0   Fenofibrate 50 MG CAPS Take 1 capsule (50 mg total) by mouth daily. 30 capsule 11   fluticasone (FLONASE) 50 MCG/ACT nasal spray Place 1 spray into both nostrils daily as needed for allergies. 11.1 mL 3   furosemide (LASIX) 20 MG tablet Take 1 tablet (20 mg total)  by mouth daily. 30 tablet 3   gabapentin (NEURONTIN) 100 MG capsule Take 1 capsule (100 mg total) by mouth 3 (three) times daily. 90 capsule 11   hydrALAZINE (APRESOLINE) 50 MG tablet Take 1 tablet (50 mg total) by mouth 3 (three) times daily. 270 tablet 1   isosorbide dinitrate (ISORDIL) 30 MG tablet TAKE 1 TABLET(30 MG) BY MOUTH FOUR TIMES DAILY 360 tablet 1   metoprolol tartrate (LOPRESSOR) 100 MG tablet Take 1 tablet (100 mg total) by mouth 2 (two) times daily. 180 tablet  1   montelukast (SINGULAIR) 10 MG tablet Take 1 tablet (10 mg total) by mouth at bedtime. 90 tablet 1   ondansetron (ZOFRAN ODT) 4 MG disintegrating tablet Take 1 tablet (4 mg total) by mouth every 8 (eight) hours as needed for nausea or vomiting. 20 tablet 0   ondansetron (ZOFRAN ODT) 4 MG disintegrating tablet Take 1 tablet (4 mg total) by mouth every 8 (eight) hours as needed for nausea or vomiting. 10 tablet 0   paliperidone (INVEGA) 6 MG 24 hr tablet Take 6 mg by mouth at bedtime.     Pancrelipase, Lip-Prot-Amyl, (ZENPEP) 40000-126000 units CPEP Take 1 tablet by mouth with breakfast, with lunch, and with evening meal. 90 capsule 2   pantoprazole (PROTONIX) 40 MG tablet Take 1 tablet (40 mg total) by mouth 2 (two) times daily. 180 tablet 3   PEG-KCl-NaCl-NaSulf-Na Asc-C (PLENVU) 140 g SOLR Take 140 g by mouth as directed. Manufacturer's coupon Universal coupon code:BIN: P2366821; GROUP: [id number]; PCN: [id number]; ID: [id number]; PAY NO MORE $50 1 each 0   sucralfate (CARAFATE) 1 GM/10ML suspension Take 10 mLs (1 g total) by mouth 4 (four) times daily -  with meals and at bedtime. 3600 mL 3   ezetimibe (ZETIA) 10 MG tablet Take 1 tablet (10 mg total) by mouth daily. 90 tablet 3   No current facility-administered medications for this visit.   Facility-Administered Medications Ordered in Other Visits  Medication Dose Route Frequency Provider Last Rate Last Admin   acetaminophen (OFIRMEV) IVPB    PRN Lissa Morales, CRNA   1,000  mg at 04/19/11 0910   glycopyrrolate (ROBINUL) injection    PRN Lissa Morales, CRNA   0.8 mg at 04/19/11 1037    Review of Systems  Constitutional:  Constitutional negative. HENT: HENT negative.  Eyes: Eyes negative.  Respiratory: Respiratory negative.  Cardiovascular: Cardiovascular negative.  GI: Positive for abdominal pain.  Musculoskeletal: Positive for back pain.  Skin: Skin negative.  Neurological: Neurological negative. Hematologic: Hematologic/lymphatic negative.  Psychiatric: Psychiatric negative.       Objective:  Objective   Vitals:   12/31/20 0852  BP: (!) 154/100  Pulse: (!) 101  Resp: 18  Temp: (!) 97.2 F (36.2 C)  TempSrc: Temporal  SpO2: 96%  Weight: 216 lb (98 kg)  Height: 6\' 1"  (1.854 m)   Body mass index is 28.5 kg/m.  Physical Exam HENT:     Head: Normocephalic.     Nose:     Comments: Wearing a mask Cardiovascular:     Rate and Rhythm: Normal rate.     Pulses: Normal pulses.  Pulmonary:     Effort: Pulmonary effort is normal.  Abdominal:     General: Abdomen is flat.     Palpations: Abdomen is soft. There is no mass.  Musculoskeletal:        General: No swelling. Normal range of motion.     Cervical back: Normal range of motion.  Skin:    General: Skin is warm and dry.     Capillary Refill: Capillary refill takes less than 2 seconds.  Neurological:     General: No focal deficit present.     Mental Status: He is alert.  Psychiatric:        Mood and Affect: Mood normal.    Data: CT IMPRESSION: 1. Probable enteritis with mild ileus. An early obstruction is not excluded. Clinical correlation is recommended. 2. Duplicated appearance of the right renal collecting system with similar degree of  hydronephrosis as the prior CT. 3. Several nonobstructing bilateral renal calculi and a 5 mm distal right ureteral/right UVJ calculus. No hydronephrosis on the left. 4. A 3.2 cm infrarenal aortic aneurysm, new since the prior CT.Recommend  follow-up ultrasound every 3 years. This recommendation follows ACR consensus guidelines: White Paper of the ACR Incidental Findings Committee II on Vascular Findings. J Am Coll Radiol 2013; 10:789-794. 5. Cholelithiasis. 6. Aortic Atherosclerosis (ICD10-I70.0).       Assessment/Plan:     60 year old male with small abdominal aneurysm.  We discussed the expected course of aneurysmal disease as well as the signs and symptoms of rupture and he demonstrates good understanding.  We will plan to see him back in 2 years with duplex.     Waynetta Sandy MD [facility]

## 2021-01-04 ENCOUNTER — Other Ambulatory Visit: Payer: Self-pay | Admitting: Internal Medicine

## 2021-01-06 DIAGNOSIS — Z87891 Personal history of nicotine dependence: Secondary | ICD-10-CM | POA: Diagnosis not present

## 2021-01-06 DIAGNOSIS — Z23 Encounter for immunization: Secondary | ICD-10-CM | POA: Diagnosis not present

## 2021-01-06 DIAGNOSIS — R634 Abnormal weight loss: Secondary | ICD-10-CM | POA: Diagnosis not present

## 2021-01-06 DIAGNOSIS — E538 Deficiency of other specified B group vitamins: Secondary | ICD-10-CM | POA: Diagnosis not present

## 2021-01-06 DIAGNOSIS — J449 Chronic obstructive pulmonary disease, unspecified: Secondary | ICD-10-CM | POA: Diagnosis not present

## 2021-01-07 ENCOUNTER — Other Ambulatory Visit: Payer: Self-pay | Admitting: Internal Medicine

## 2021-01-07 DIAGNOSIS — Z87891 Personal history of nicotine dependence: Secondary | ICD-10-CM

## 2021-01-12 NOTE — Progress Notes (Signed)
Remote pacemaker transmission.   

## 2021-01-19 ENCOUNTER — Encounter: Payer: PPO | Admitting: Gastroenterology

## 2021-02-02 DIAGNOSIS — F209 Schizophrenia, unspecified: Secondary | ICD-10-CM | POA: Diagnosis not present

## 2021-02-10 ENCOUNTER — Telehealth: Payer: Self-pay

## 2021-02-10 NOTE — Telephone Encounter (Signed)
Both medications are filled by Cardiology, patient advised to contact that office. Verbally understood.

## 2021-02-10 NOTE — Telephone Encounter (Signed)
Clonidine Metoprolol tartrate  [facility] [place]

## 2021-02-15 DIAGNOSIS — D631 Anemia in chronic kidney disease: Secondary | ICD-10-CM | POA: Diagnosis not present

## 2021-02-15 DIAGNOSIS — N1330 Unspecified hydronephrosis: Secondary | ICD-10-CM | POA: Diagnosis not present

## 2021-02-15 DIAGNOSIS — N2581 Secondary hyperparathyroidism of renal origin: Secondary | ICD-10-CM | POA: Diagnosis not present

## 2021-02-15 DIAGNOSIS — I129 Hypertensive chronic kidney disease with stage 1 through stage 4 chronic kidney disease, or unspecified chronic kidney disease: Secondary | ICD-10-CM | POA: Diagnosis not present

## 2021-02-15 DIAGNOSIS — N1832 Chronic kidney disease, stage 3b: Secondary | ICD-10-CM | POA: Diagnosis not present

## 2021-02-21 ENCOUNTER — Encounter: Payer: PPO | Admitting: Gastroenterology

## 2021-03-21 ENCOUNTER — Other Ambulatory Visit: Payer: Self-pay

## 2021-03-21 ENCOUNTER — Telehealth: Payer: Self-pay | Admitting: Cardiovascular Disease

## 2021-03-21 DIAGNOSIS — I10 Essential (primary) hypertension: Secondary | ICD-10-CM

## 2021-03-21 MED ORDER — ISOSORBIDE DINITRATE 30 MG PO TABS
ORAL_TABLET | ORAL | 1 refills | Status: DC
Start: 2021-03-21 — End: 2022-05-27

## 2021-03-21 NOTE — Telephone Encounter (Signed)
Patient came into office and wanted to make provider aware that he is moving to [place]. Patient states he will not longer be a patient of Dr.o'neal.

## 2021-03-23 ENCOUNTER — Ambulatory Visit: Payer: PPO | Admitting: Nurse Practitioner

## 2021-03-28 ENCOUNTER — Ambulatory Visit (INDEPENDENT_AMBULATORY_CARE_PROVIDER_SITE_OTHER): Payer: PPO

## 2021-03-28 DIAGNOSIS — I441 Atrioventricular block, second degree: Secondary | ICD-10-CM

## 2021-03-28 LAB — CUP PACEART REMOTE DEVICE CHECK
Battery Remaining Longevity: 97 mo
Battery Voltage: 3 V
Brady Statistic AP VP Percent: 0.14 %
Brady Statistic AP VS Percent: 95.98 %
Brady Statistic AS VP Percent: 0 %
Brady Statistic AS VS Percent: 3.88 %
Brady Statistic RA Percent Paced: 96.13 %
Brady Statistic RV Percent Paced: 0.14 %
Date Time Interrogation Session: 20221120222417
Implantable Lead Implant Date: 20181022
Implantable Lead Implant Date: 20181022
Implantable Lead Location: 753859
Implantable Lead Location: 753860
Implantable Lead Model: 3830
Implantable Lead Model: 5076
Implantable Pulse Generator Implant Date: 20181022
Lead Channel Impedance Value: 323 Ohm
Lead Channel Impedance Value: 361 Ohm
Lead Channel Impedance Value: 456 Ohm
Lead Channel Impedance Value: 475 Ohm
Lead Channel Pacing Threshold Amplitude: 0.625 V
Lead Channel Pacing Threshold Amplitude: 1 V
Lead Channel Pacing Threshold Pulse Width: 0.4 ms
Lead Channel Pacing Threshold Pulse Width: 0.4 ms
Lead Channel Sensing Intrinsic Amplitude: 10.375 mV
Lead Channel Sensing Intrinsic Amplitude: 10.375 mV
Lead Channel Sensing Intrinsic Amplitude: 3.75 mV
Lead Channel Sensing Intrinsic Amplitude: 3.75 mV
Lead Channel Setting Pacing Amplitude: 1.5 V
Lead Channel Setting Pacing Amplitude: 3.5 V
Lead Channel Setting Pacing Pulse Width: 1 ms
Lead Channel Setting Sensing Sensitivity: 1.2 mV

## 2021-03-29 IMAGING — CR RIGHT HAND - COMPLETE 3+ VIEW
3 series · 3 of 3 positions shown · non-contrast
Comparison: None.

CLINICAL DATA: Right hand pain secondary to a fall off of a curb.

EXAM:
RIGHT HAND - COMPLETE 3+ VIEW

[x hand pa right]
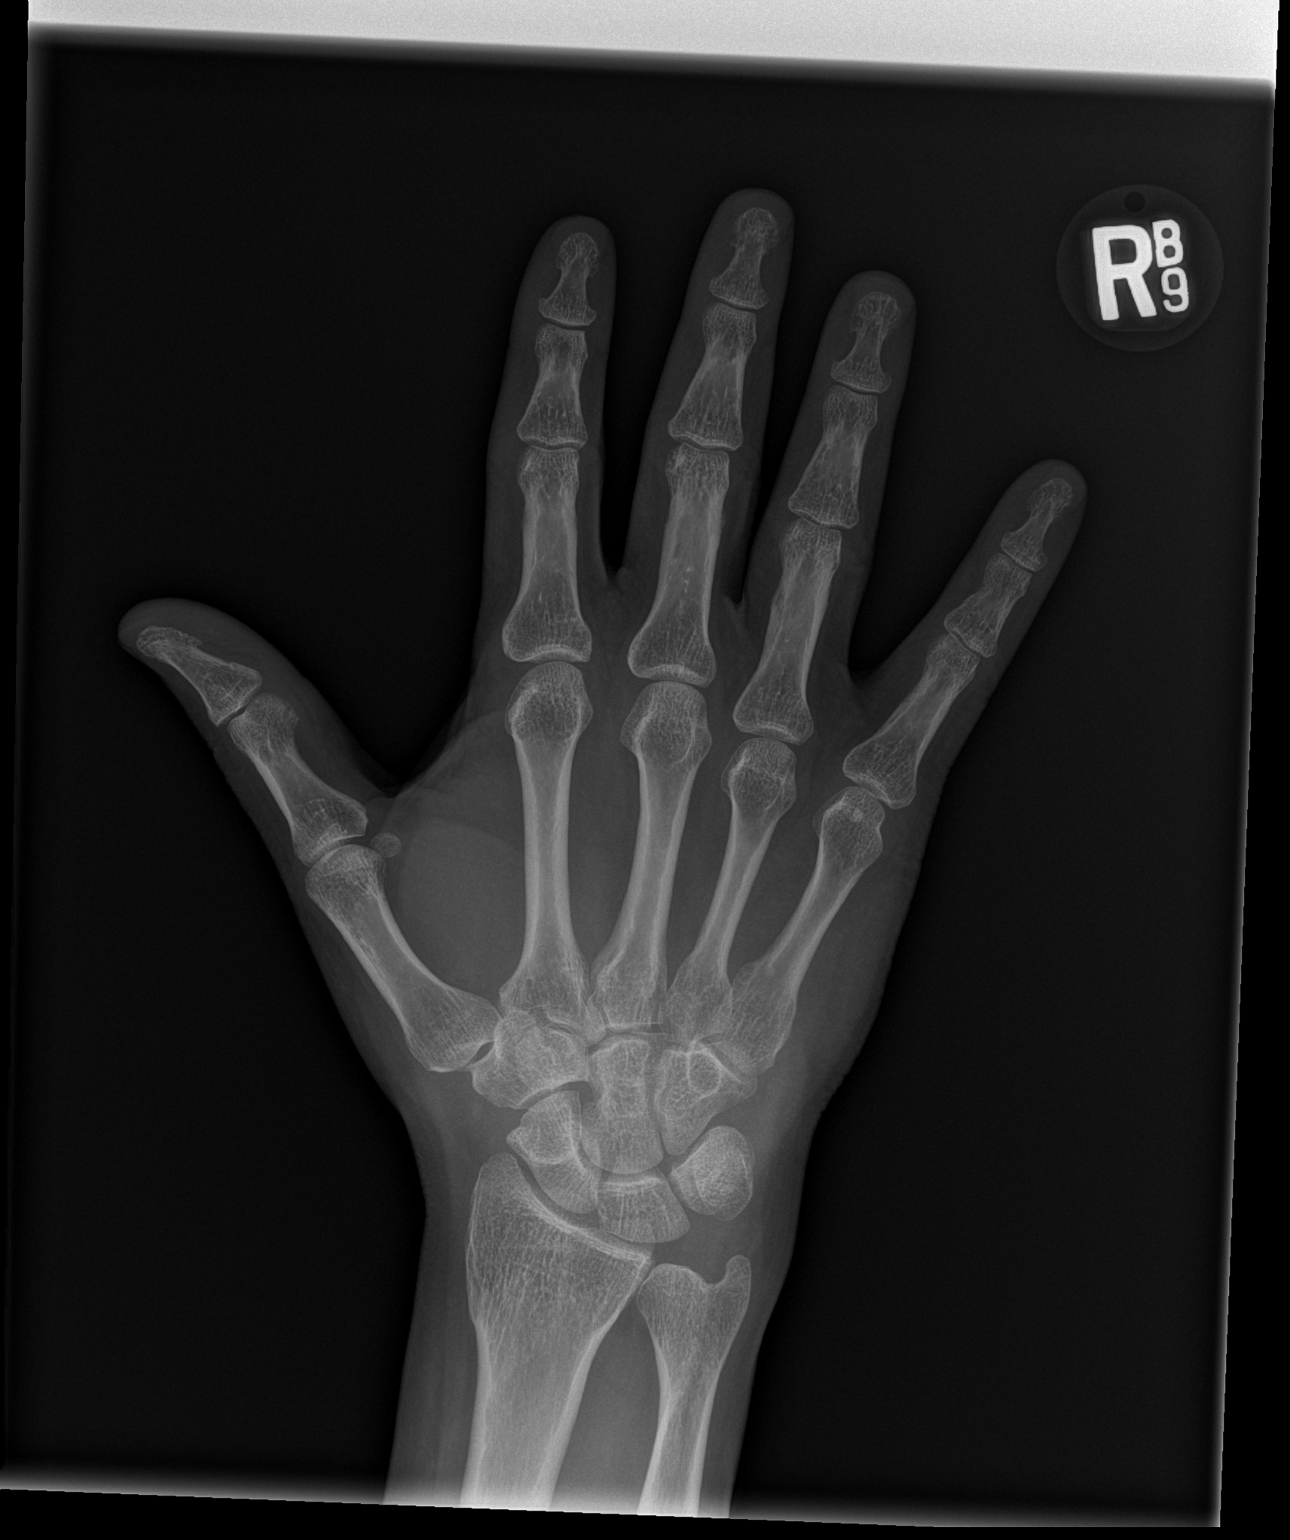

[x hand obl right]
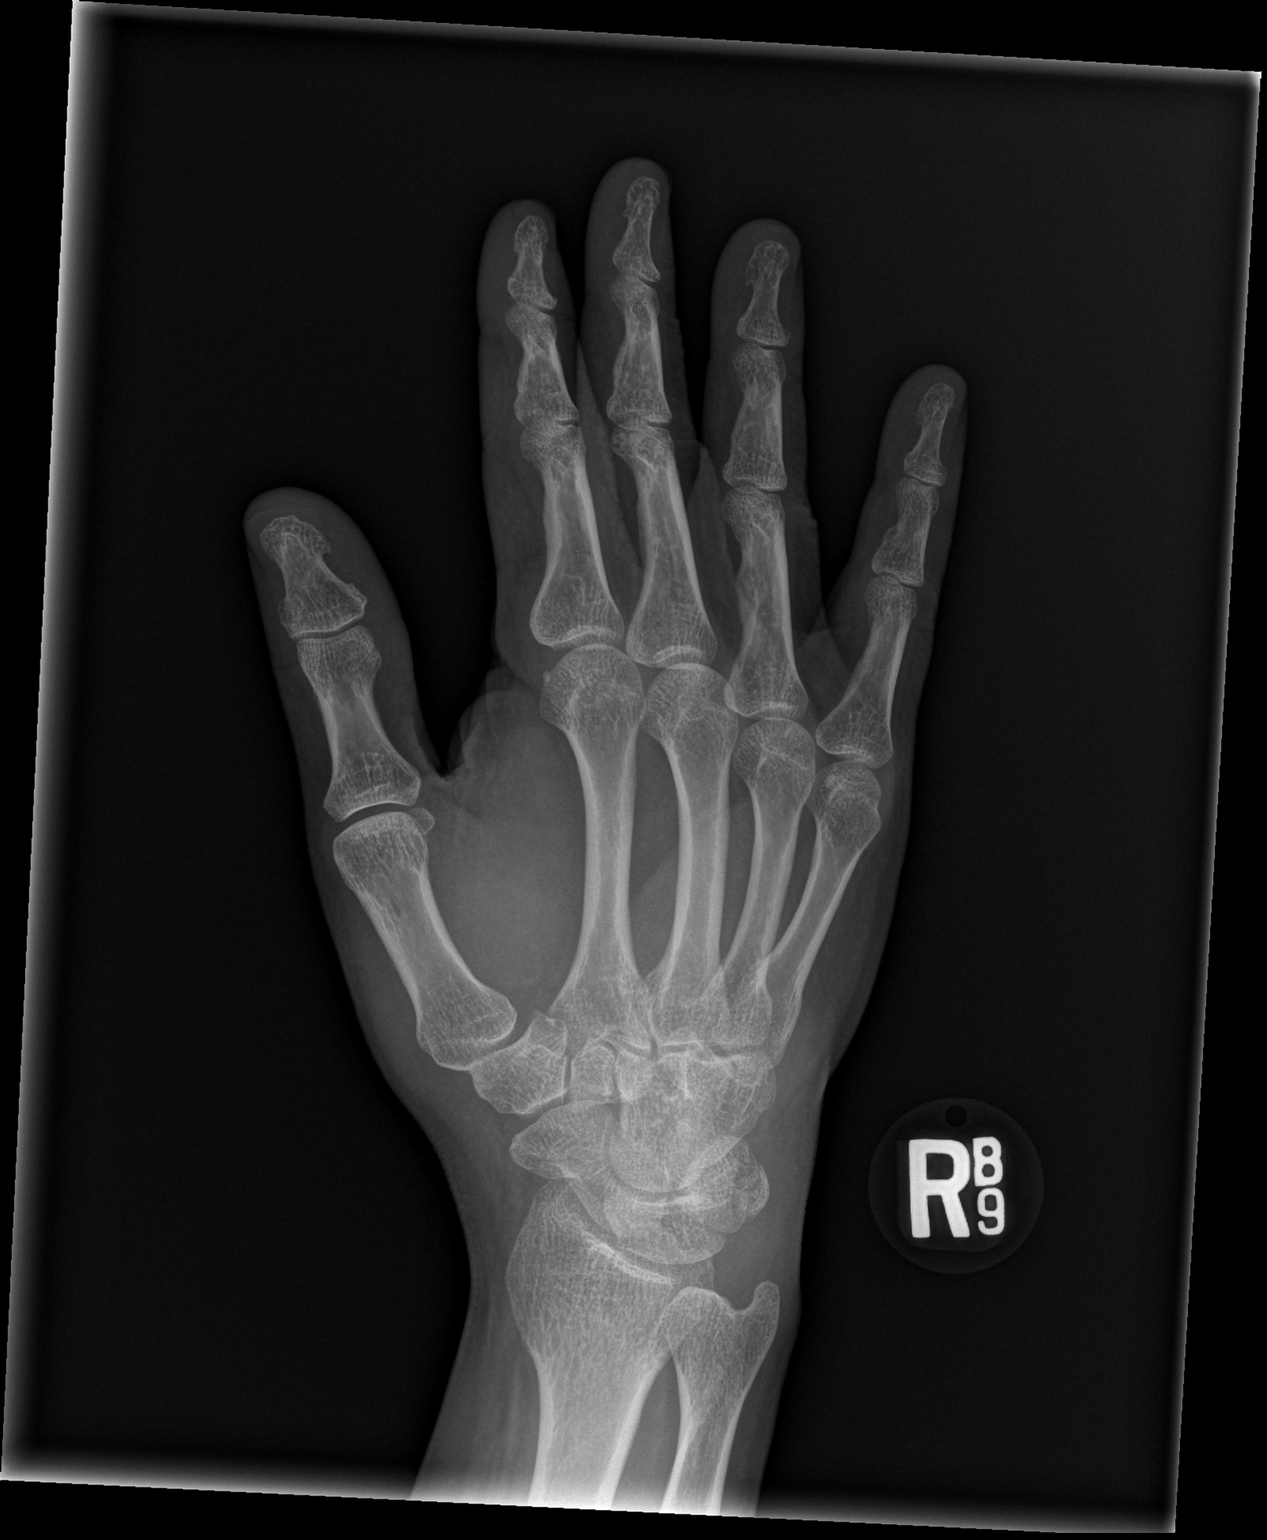

[x hand lat right]
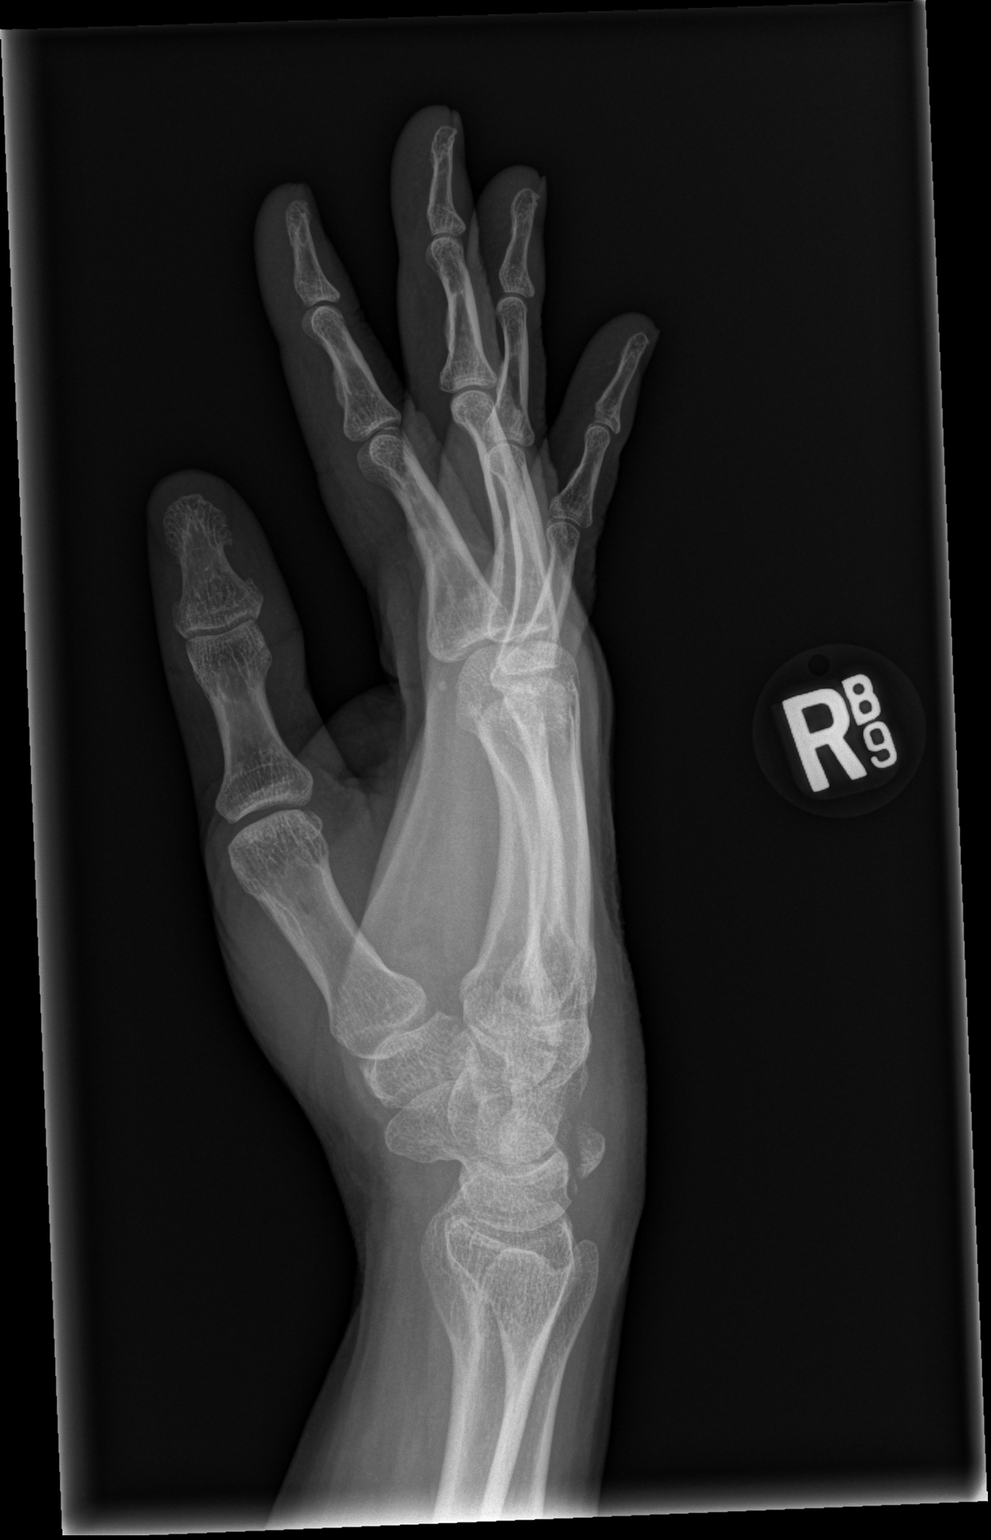

[3 of 3 positions shown; findings below may reference images not displayed]

FINDINGS: There are avulsion fractures from the dorsal aspect of the
triquetrum. The other bones of the hand and wrist are intact.
IMPRESSION: Fracture of the triquetrum.

## 2021-03-29 IMAGING — CR RIGHT WRIST - COMPLETE 3+ VIEW
4 series · 4 of 4 positions shown · non-contrast
Comparison: None.

CLINICAL DATA: Right wrist and hand pain secondary to a fall off of
a curb.

EXAM:
RIGHT WRIST - COMPLETE 3+ VIEW

[x wrist pa right]
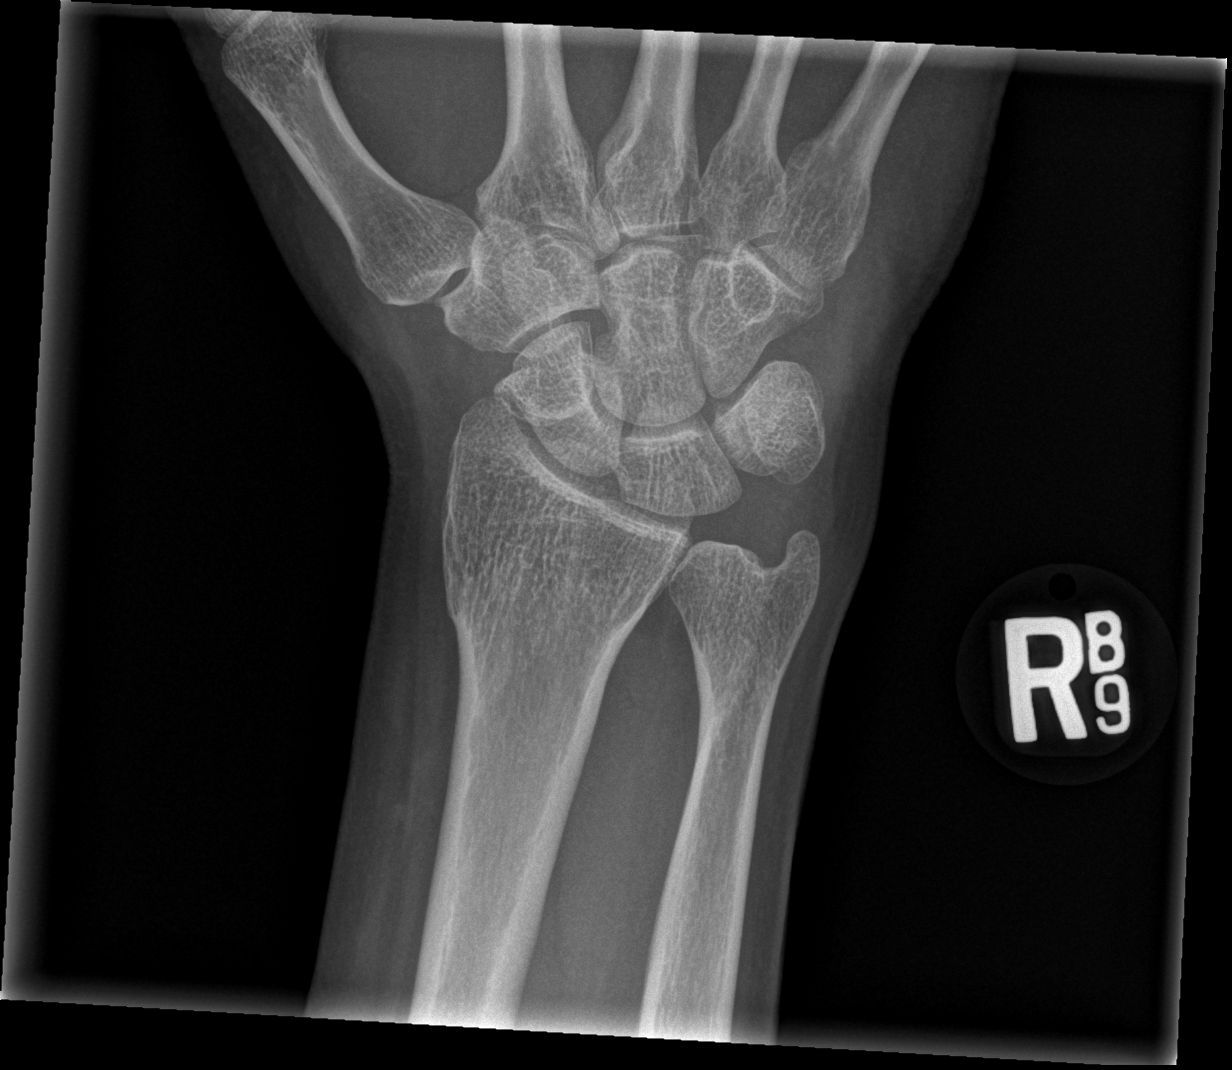

[x wrist obl right]
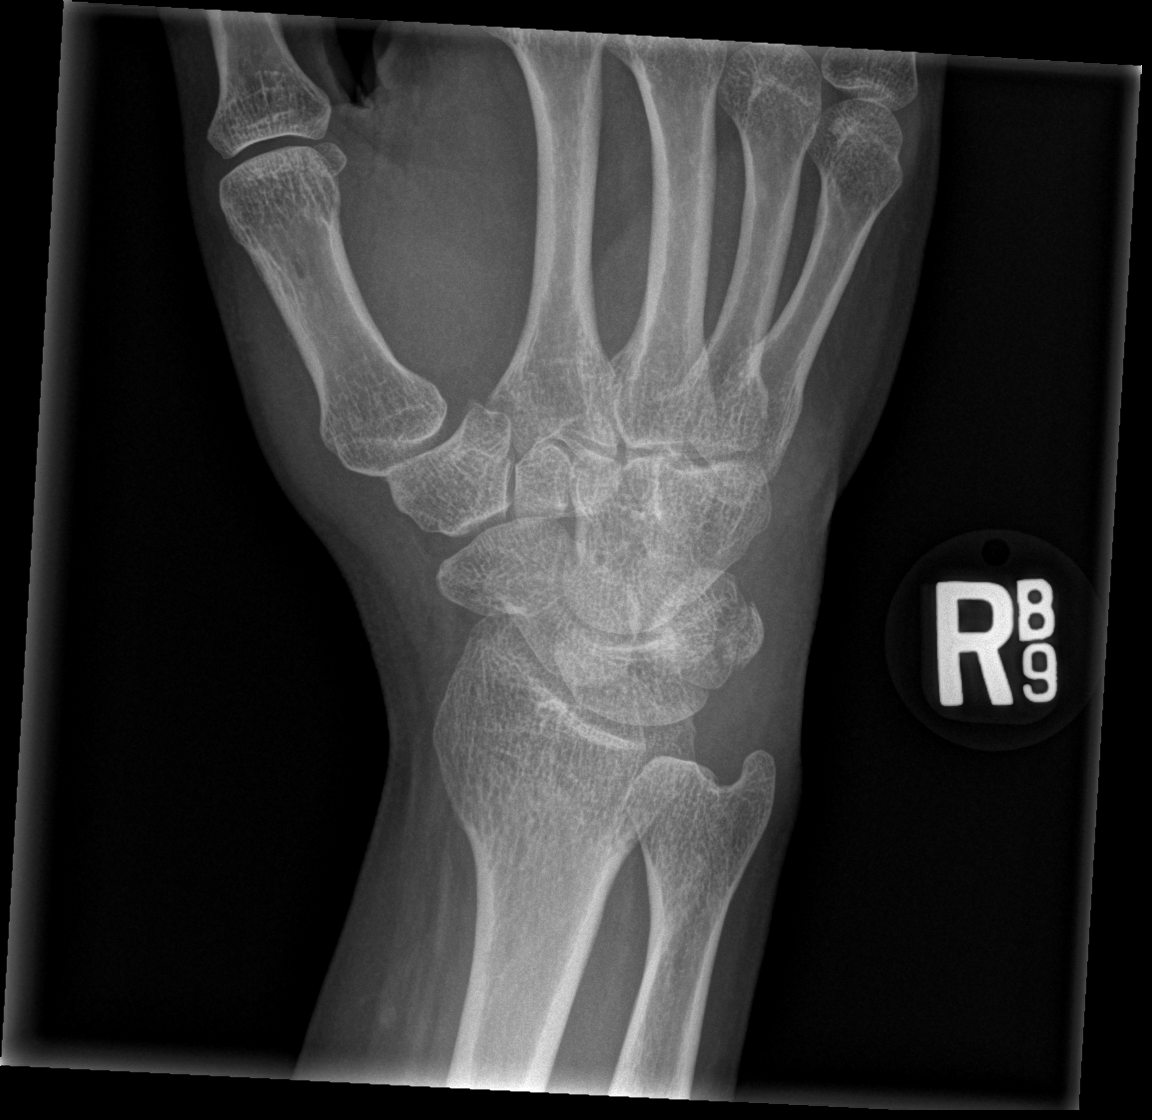

[x wrist lat right]
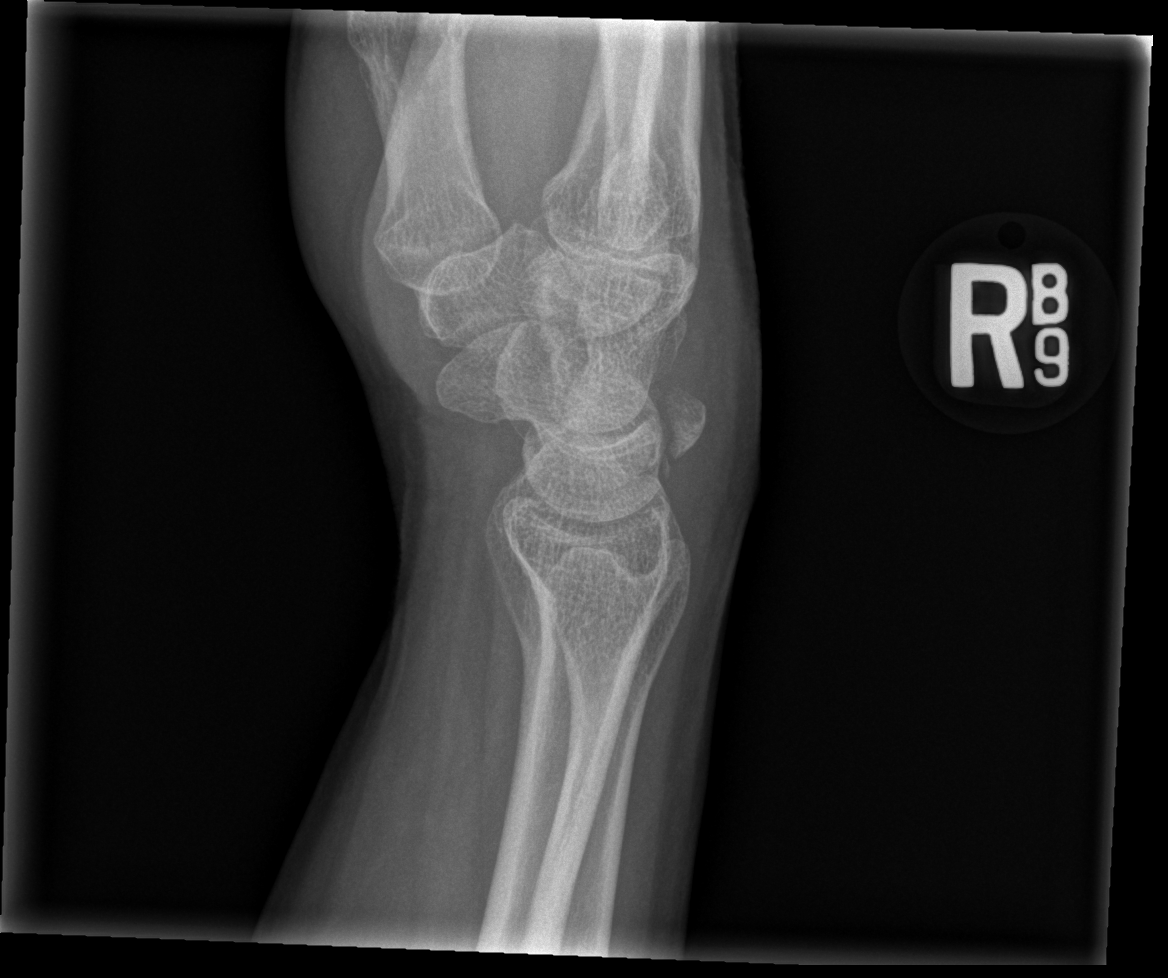

[x wrist navicular view right]
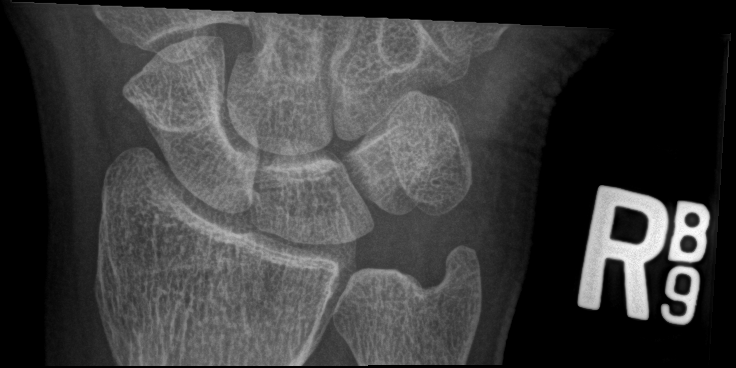

[4 of 4 positions shown; findings below may reference images not displayed]

FINDINGS: There is a prominent dorsal avulsion fracture of the triquetrum seen
on the lateral view. No dislocation. The other bones of the wrist
appear intact.
IMPRESSION: Fracture of the triquetrum.

## 2021-04-05 DIAGNOSIS — N3010 Interstitial cystitis (chronic) without hematuria: Secondary | ICD-10-CM | POA: Diagnosis not present

## 2021-04-05 DIAGNOSIS — F250 Schizoaffective disorder, bipolar type: Secondary | ICD-10-CM | POA: Diagnosis not present

## 2021-04-05 NOTE — Progress Notes (Signed)
Remote pacemaker transmission.   

## 2021-04-12 ENCOUNTER — Telehealth: Payer: Self-pay | Admitting: Gastroenterology

## 2021-04-12 ENCOUNTER — Other Ambulatory Visit: Payer: Self-pay

## 2021-04-12 DIAGNOSIS — K2270 Barrett's esophagus without dysplasia: Secondary | ICD-10-CM

## 2021-04-12 MED ORDER — PANTOPRAZOLE SODIUM 40 MG PO TBEC: 40.0000 mg | DELAYED_RELEASE_TABLET | Freq: Two times a day (BID) | ORAL | 2 refills | Status: DC

## 2021-04-12 NOTE — Telephone Encounter (Signed)
Outpatient Medication Detail   Disp Refills Start End   pantoprazole (PROTONIX) 40 MG tablet 60 tablet 2 04/12/2021    Sig - Route: Take 1 tablet (40 mg total) by mouth 2 (two) times daily. - Oral   Sent to pharmacy as: pantoprazole (PROTONIX) 40 MG tablet   Notes to Pharmacy: MUST CALL TO SCHEDULE FOLLOW UP APPT FOR ANY FUTURE REFILL REQUESTS   E-Prescribing Status: Receipt confirmed by pharmacy (04/12/2021  2:08 PM EST)

## 2021-05-31 ENCOUNTER — Telehealth: Payer: Self-pay | Admitting: *Deleted

## 2021-05-31 NOTE — Telephone Encounter (Signed)
Patient no show PV appointment for today. I called patient, no answer, left a message for the patient to call us back today before 5 pm or the PV and procedure will be cancelled.

## 2021-06-14 ENCOUNTER — Encounter: Payer: PPO | Admitting: Gastroenterology

## 2021-06-27 ENCOUNTER — Ambulatory Visit (INDEPENDENT_AMBULATORY_CARE_PROVIDER_SITE_OTHER): Payer: Medicare Other

## 2021-06-27 DIAGNOSIS — I441 Atrioventricular block, second degree: Secondary | ICD-10-CM

## 2021-06-27 LAB — CUP PACEART REMOTE DEVICE CHECK
Battery Remaining Longevity: 89 mo
Battery Voltage: 3 V
Brady Statistic AP VP Percent: 0.13 %
Brady Statistic AP VS Percent: 95.93 %
Brady Statistic AS VP Percent: 0 %
Brady Statistic AS VS Percent: 3.94 %
Brady Statistic RA Percent Paced: 96.08 %
Brady Statistic RV Percent Paced: 0.14 %
Date Time Interrogation Session: 20230219210025
Implantable Lead Implant Date: 20181022
Implantable Lead Implant Date: 20181022
Implantable Lead Location: 753859
Implantable Lead Location: 753860
Implantable Lead Model: 3830
Implantable Lead Model: 5076
Implantable Pulse Generator Implant Date: 20181022
Lead Channel Impedance Value: 304 Ohm
Lead Channel Impedance Value: 323 Ohm
Lead Channel Impedance Value: 456 Ohm
Lead Channel Impedance Value: 513 Ohm
Lead Channel Pacing Threshold Amplitude: 0.75 V
Lead Channel Pacing Threshold Amplitude: 0.875 V
Lead Channel Pacing Threshold Pulse Width: 0.4 ms
Lead Channel Pacing Threshold Pulse Width: 0.4 ms
Lead Channel Sensing Intrinsic Amplitude: 10 mV
Lead Channel Sensing Intrinsic Amplitude: 10 mV
Lead Channel Sensing Intrinsic Amplitude: 3.625 mV
Lead Channel Sensing Intrinsic Amplitude: 3.625 mV
Lead Channel Setting Pacing Amplitude: 1.5 V
Lead Channel Setting Pacing Amplitude: 3.5 V
Lead Channel Setting Pacing Pulse Width: 1 ms
Lead Channel Setting Sensing Sensitivity: 1.2 mV

## 2021-06-30 NOTE — Progress Notes (Signed)
Remote pacemaker transmission.   

## 2021-07-05 ENCOUNTER — Other Ambulatory Visit: Payer: Self-pay | Admitting: Gastroenterology

## 2021-07-05 DIAGNOSIS — K2270 Barrett's esophagus without dysplasia: Secondary | ICD-10-CM

## 2021-07-06 ENCOUNTER — Other Ambulatory Visit: Payer: Self-pay | Admitting: Family Medicine

## 2021-07-11 ENCOUNTER — Other Ambulatory Visit: Payer: Self-pay | Admitting: Family

## 2021-07-11 ENCOUNTER — Telehealth: Payer: Self-pay | Admitting: Allergy & Immunology

## 2021-07-11 MED ORDER — ALBUTEROL SULFATE HFA 108 (90 BASE) MCG/ACT IN AERS: 2.0000 | INHALATION_SPRAY | Freq: Four times a day (QID) | RESPIRATORY_TRACT | 1 refills | Status: DC | PRN

## 2021-07-11 NOTE — Telephone Encounter (Signed)
It looks like this was already sent today.

## 2021-07-11 NOTE — Telephone Encounter (Signed)
VENTOLIN TO [facility] [address] ?

## 2021-07-11 NOTE — Patient Instructions (Addendum)
Ashtma/COPD ?Continue montelukast 10 mg once a day to prevent cough or wheeze ?Continue Brezrti-2 puffs twice a day with a spacer to prevent cough or wheeze. Spacer sent by prescription to your pharmacy. Demonstration given. Reviewed proper technique of inhaler. Empty lungs first. ?Continue albuterol 2 puffs once every 4 hours as needed for cough or wheeze ?You may use albuterol 2 puffs 5-15 minutes before activity to decrease cough or wheeze ?We will get some lab work to see if you qualify for a biologic if needed in the future. We will call you with results once they are all back. ?Consider referral to pulmonology  ? ?Allergic rhinitis ?Continue allergen avoidance measures directed toward pollen, mold, dust mite, and dog as listed below ?Continue cetirizine 10 mg once a day as needed for runny nose or itch ?Continue Flonase 2 sprays in each nostril once a day as needed for stuffy nose ?Consider saline nasal rinses as needed for nasal symptoms. Use this before any medicated nasal sprays for best result ? ?Reflux ?Continue dietary and lifestyle modifications as listed below ?Continue your current medications as prescribed by Dr. Modena Nunnery ? ?Call the clinic if this treatment plan is not working well for you ? ?Follow up in  months or sooner if needed. ? ?Reducing Pollen Exposure ?The American Academy of Allergy, Asthma and Immunology suggests the following steps to reduce your exposure to pollen during allergy seasons. ?Do not hang sheets or clothing out to dry; pollen may collect on these items. ?Do not mow lawns or spend time around freshly cut grass; mowing stirs up pollen. ?Keep windows closed at night.  Keep car windows closed while driving. ?Minimize morning activities outdoors, a time when pollen counts are usually at their highest. ?Stay indoors as much as possible when pollen counts or humidity is high and on windy days when pollen tends to remain in the air longer. ?Use air conditioning when possible.  Many  air conditioners have filters that trap the pollen spores. ?Use a HEPA room air filter to remove pollen form the indoor air you breathe. ? ?Control of Mold Allergen ?Mold and fungi can grow on a variety of surfaces provided certain temperature and moisture conditions exist.  Outdoor molds grow on plants, decaying vegetation and soil.  The major outdoor mold, Alternaria and Cladosporium, are found in very high numbers during hot and dry conditions.  Generally, a late Summer - Fall peak is seen for common outdoor fungal spores.  Rain will temporarily lower outdoor mold spore count, but counts rise rapidly when the rainy period ends.  The most important indoor molds are Aspergillus and Penicillium.  Dark, humid and poorly ventilated basements are ideal sites for mold growth.  The next most common sites of mold growth are the bathroom and the kitchen. ? ?Outdoor [employer] ?Use air conditioning and keep windows closed ?Avoid exposure to decaying vegetation. ?Avoid leaf raking. ?Avoid grain handling. ?Consider wearing a face mask if working in moldy areas. ? ?Indoor Mold Control ?Maintain humidity below 50%. ?Clean washable surfaces with 5% bleach solution. ?Remove sources e.g. Contaminated carpets. ? ?Control of Dog or Cat Allergen ?Avoidance is the best way to manage a dog or cat allergy. If you have a dog or cat and are allergic to dog or cats, consider removing the dog or cat from the home. ?If you have a dog or cat but don?t want to find it a new home, or if your family wants a pet even though someone in the  household is allergic, here are some strategies that may help keep symptoms at bay: ? ?Keep the pet out of your bedroom and restrict it to only a few rooms. Be advised that keeping the dog or cat in only one room will not limit the allergens to that room. ?Don?t pet, hug or kiss the dog or cat; if you do, wash your hands with soap and water. ?High-efficiency particulate air (HEPA) cleaners run continuously in  a bedroom or living room can reduce allergen levels over time. ?Regular use of a high-efficiency vacuum cleaner or a central vacuum can reduce allergen levels. ?Giving your dog or cat a bath at least once a week can reduce airborne allergen. ? ? ?Control of Dust Mite Allergen ?Dust mites play a major role in allergic asthma and rhinitis. They occur in environments with high humidity wherever human skin is found. Dust mites absorb humidity from the atmosphere (ie, they do not drink) and feed on organic matter (including shed human and animal skin). Dust mites are a microscopic type of insect that you cannot see with the naked eye. High levels of dust mites have been detected from mattresses, pillows, carpets, upholstered furniture, bed covers, clothes, soft toys and any woven material. The principal allergen of the dust mite is found in its feces. A gram of dust may contain 1,000 mites and 250,000 fecal particles. Mite antigen is easily measured in the air during house cleaning activities. Dust mites do not bite and do not cause harm to humans, other than by triggering allergies/asthma. ? ?Ways to decrease your exposure to dust mites in your home: ? ?1. Encase mattresses, box springs and pillows with a mite-impermeable barrier or cover ? ?2. Wash sheets, blankets and drapes weekly in hot water (130? F) with detergent and dry them in a dryer on the hot setting. ? ?3. Have the room cleaned frequently with a vacuum cleaner and a damp dust-mop. For carpeting or rugs, vacuuming with a vacuum cleaner equipped with a high-efficiency particulate air (HEPA) filter. The dust mite allergic individual should not be in a room which is being cleaned and should wait 1 hour after cleaning before going into the room. ? ?4. Do not sleep on upholstered furniture (eg, couches). ? ?5. If possible removing carpeting, upholstered furniture and drapery from the home is ideal. Horizontal blinds should be eliminated in the rooms where the  person spends the most time (bedroom, study, television room). Washable vinyl, roller-type shades are optimal. ? ?6. Remove all non-washable stuffed toys from the bedroom. Wash stuffed toys weekly like sheets and blankets above. ? ?7. Reduce indoor humidity to less than 50%. Inexpensive humidity monitors can be purchased at most hardware stores. Do not use a humidifier as can make the problem worse and are not recommended. ? ? ?Lifestyle Changes for Controlling GERD ?When you have GERD, stomach acid feels as if it?s backing up toward your mouth. ?Whether or not you take medication to control your GERD, your symptoms can often be ?improved with lifestyle changes.  ? ?Raise Your Head ?Reflux is more likely to strike when you?re lying down flat, because stomach fluid can ?flow backward more easily. Raising the head of your bed 4-6 inches can help. To do this: ?Slide blocks or books under the legs at the head of your bed. Or, place a wedge under ?the mattress. Many foam stores can make a suitable wedge for you. The wedge ?should run from your waist to the top of your head. ?  Don?t just prop your head on several pillows. This increases pressure on your ?stomach. It can make GERD worse. ? ?Watch Your Eating Habits ?Certain foods may increase the acid in your stomach or relax the lower esophageal ?sphincter, making GERD more likely. It?s best to avoid the following: ?Coffee, tea, and carbonated drinks (with and without caffeine) ?Fatty, fried, or spicy food ?Mint, chocolate, onions, and tomatoes ?Any other foods that seem to irritate your stomach or cause you pain ? ?Relieve the Pressure ?Eat smaller meals, even if you have to eat more often. ?Don?t lie down right after you eat. Wait a few hours for your stomach to empty. ?Avoid tight belts and tight-fitting clothes. ?Lose excess weight. ? ?Tobacco and Alcohol ?Avoid smoking tobacco and drinking alcohol. They can make GERD symptoms worse. ? ? ?

## 2021-07-11 NOTE — Telephone Encounter (Signed)
Duplicate

## 2021-07-11 NOTE — Telephone Encounter (Signed)
Called patient - DOB verified - advised ofc visit is needed for future refills - schedule f/u ofc visit for tomorrow 07/12/21 at 10 am. ? ?Refilling Albuterol (Ventolin HFA)  ?

## 2021-07-12 ENCOUNTER — Encounter: Payer: Self-pay | Admitting: Family

## 2021-07-12 ENCOUNTER — Ambulatory Visit: Payer: Medicare Other | Admitting: Family

## 2021-07-12 ENCOUNTER — Other Ambulatory Visit: Payer: Self-pay

## 2021-07-12 VITALS — BP 130/80 | HR 76 | Temp 97.8°F | Resp 16 | Ht 73.0 in | Wt 209.1 lb

## 2021-07-12 DIAGNOSIS — J449 Chronic obstructive pulmonary disease, unspecified: Secondary | ICD-10-CM

## 2021-07-12 DIAGNOSIS — J3089 Other allergic rhinitis: Secondary | ICD-10-CM | POA: Diagnosis not present

## 2021-07-12 DIAGNOSIS — K219 Gastro-esophageal reflux disease without esophagitis: Secondary | ICD-10-CM

## 2021-07-12 DIAGNOSIS — J302 Other seasonal allergic rhinitis: Secondary | ICD-10-CM

## 2021-07-12 MED ORDER — SPACER/AERO-HOLDING CHAMBERS DEVI: 0 refills | Status: DC

## 2021-07-12 NOTE — Progress Notes (Signed)
[name] [address] Dept: [phone]  FOLLOW UP NOTE  Patient ID: Peter Marsh, male    DOB: [birth year]  Age: 61 y.o. MRN: [id number] Date of Office Visit: 07/12/2021  Assessment  Chief Complaint: Follow-up and Medication Refill  HPI Peter Marsh is a 61 year old male who presents today for follow-up of asthma-COPD overlap syndrome, seasonal and perennial allergic rhinitis, gastroesophageal reflux disease.  He was last seen on December 09, 2020 by Gareth Morgan, FNP.  He denies any new diagnosis or surgeries since his last office visit.  His past medical history includes kidney disease, Barrett's esophagus, and a dual-chamber pacemaker.  He continues to follow-up with Dr. Hollie Salk with [facility].  He reports that he has stage IV chronic kidney disease.  Asthma/COPD overlap syndrome is reported as controlled with Singulair 10 mg at night, Breztri 2 puffs twice a day without a spacer, albuterol once a day.  He denies coughing, wheezing, tightness in chest, shortness of breath, and nocturnal awakenings due to breathing problems.  Since his last office visit he denies any any trips to the emergency room or urgent care due to breathing problems.  He uses albuterol once a day because he was told to use his albuterol once a day.  Instructed that the albuterol is to be used only as needed so it can work when it is needed.  He does not follow-up with a pulmonologist.  After giving him the 4 puffs of Xopenex with a spacer it was found that he was using improper technique.  Reviewed proper technique of inhaler with spacer.  Seasonal and perennial allergic rhinitis is reported as controlled right now with Zyrtec 10 mg once a day and Flonase nasal spray as needed.  He reports he uses the Flonase more in the spring.  He denies any rhinorrhea, nasal congestion, and postnasal drip.  He has not had any sinus infections since we last saw him.  He continues to follow-up with Dr. Modena Nunnery  of his gastroenterologist.  He denies any reflux symptoms.  He does report that sometimes he does take Carafate if needed along with his generic Protonix.   Drug Allergies:  Allergies  Allergen Reactions   Methylpyrrolidone Hives    froze the intestine   Niacin Itching, Nausea And Vomiting and Other (See Comments)    Flushing, itching, tingling    Ace Inhibitors     Other reaction(s): CKD 4   Norvasc [Amlodipine Besylate] Other (See Comments)    Swollen Feet   Other Other (See Comments)   Oxybutynin Chloride [Oxybutynin Chloride Er] Other (See Comments)    froze the intestine   Vesicare [Solifenacin Succinate] Other (See Comments)    Froze the intestine    Ciprofloxacin Rash and Other (See Comments)    Felt flushed    Oxybutynin Rash and Other (See Comments)    bowel obst   Solifenacin Rash    Review of Systems: Review of Systems  Constitutional:  Negative for chills and fever.  HENT:         Denies rhinorrhea, nasal congestion, and post nasal drip  Eyes:        Denies itchy watery eyes  Respiratory:  Negative for cough, shortness of breath and wheezing.        Denies cough, wheeze, tightness in chest, and shortness of breath  Cardiovascular:  Negative for chest pain and palpitations.  Gastrointestinal:        Denies reflux with medications  Genitourinary:  Negative for frequency.  Skin:  Negative for itching and rash.  Neurological:  Negative for headaches.  Endo/Heme/Allergies:  Positive for environmental allergies.    Physical Exam: BP 130/80    Pulse 76    Temp 97.8 F (36.6 C)    Resp 16    Ht '6\' 1"'$  (1.854 m)    Wt 209 lb 2 oz (94.9 kg)    SpO2 97%    BMI 27.59 kg/m    Physical Exam Constitutional:      Appearance: Normal appearance.  HENT:     Head: Normocephalic and atraumatic.     Comments: Pharynx normal. Eyes normal. Ears normal. Nose normal    Right Ear: Tympanic membrane, ear canal and external ear normal.     Left Ear: Tympanic membrane, ear  canal and external ear normal.     Nose: Nose normal.     Mouth/Throat:     Mouth: Mucous membranes are moist.     Pharynx: Oropharynx is clear.  Eyes:     Conjunctiva/sclera: Conjunctivae normal.  Cardiovascular:     Rate and Rhythm: Normal rate and regular rhythm.     Heart sounds: Normal heart sounds.  Pulmonary:     Effort: Pulmonary effort is normal.     Breath sounds: Normal breath sounds.     Comments: Lungs clear to auscultation Musculoskeletal:     Cervical back: Neck supple.  Skin:    General: Skin is warm.  Neurological:     Mental Status: He is alert and oriented to person, place, and time.  Psychiatric:        Mood and Affect: Mood normal.        Behavior: Behavior normal.        Thought Content: Thought content normal.        Judgment: Judgment normal.    Diagnostics: FVC 1.91 L, FEV1 1.36 L (35%).  Predicted FVC 5.03 L, predicted FEV1 3.84 L.  Spirometry indicates possible severe restriction.  Postbronchodilator response shows FVC 4.19 L, FEV1 1.79 L.  There is a 32% change in FEV1.  Spirometry indicates possible severe obstruction.  He reports that his breathing is better after the 4 puffs of Xopenex.  Assessment and Plan: 1. Asthma-COPD overlap syndrome ([place])   2. Seasonal and perennial allergic rhinitis   3. Gastroesophageal reflux disease, unspecified whether esophagitis present     Meds ordered this encounter  Medications   Spacer/Aero-Holding Josiah Lobo DEVI    Sig: Use as directed with inhaler.    Dispense:  1 each    Refill:  0    Patient Instructions  Ashtma/COPD Continue montelukast 10 mg once a day to prevent cough or wheeze Continue Brezrti-2 puffs twice a day with a spacer to prevent cough or wheeze. Spacer sent by prescription to your pharmacy. Demonstration given. Reviewed proper technique of inhaler. Empty lungs first. Continue albuterol 2 puffs once every 4 hours as needed for cough or wheeze You may use albuterol 2 puffs 5-15 minutes  before activity to decrease cough or wheeze We will get some lab work to see if you qualify for a biologic if needed in the future. We will call you with results once they are all back. Consider referral to pulmonology   Allergic rhinitis Continue allergen avoidance measures directed toward pollen, mold, dust mite, and dog as listed below Continue cetirizine 10 mg once a day as needed for runny nose or itch Continue Flonase 2 sprays in each nostril once a day  as needed for stuffy nose Consider saline nasal rinses as needed for nasal symptoms. Use this before any medicated nasal sprays for best result  Reflux Continue dietary and lifestyle modifications as listed below Continue your current medications as prescribed by Dr. Modena Nunnery  Call the clinic if this treatment plan is not working well for you  Follow up in  months or sooner if needed.  Reducing Pollen Exposure The American Academy of Allergy, Asthma and Immunology suggests the following steps to reduce your exposure to pollen during allergy seasons. Do not hang sheets or clothing out to dry; pollen may collect on these items. Do not mow lawns or spend time around freshly cut grass; mowing stirs up pollen. Keep windows closed at night.  Keep car windows closed while driving. Minimize morning activities outdoors, a time when pollen counts are usually at their highest. Stay indoors as much as possible when pollen counts or humidity is high and on windy days when pollen tends to remain in the air longer. Use air conditioning when possible.  Many air conditioners have filters that trap the pollen spores. Use a HEPA room air filter to remove pollen form the indoor air you breathe.  Control of Mold Allergen Mold and fungi can grow on a variety of surfaces provided certain temperature and moisture conditions exist.  Outdoor molds grow on plants, decaying vegetation and soil.  The major outdoor mold, Alternaria and Cladosporium, are found in  very high numbers during hot and dry conditions.  Generally, a late Summer - Fall peak is seen for common outdoor fungal spores.  Rain will temporarily lower outdoor mold spore count, but counts rise rapidly when the rainy period ends.  The most important indoor molds are Aspergillus and Penicillium.  Dark, humid and poorly ventilated basements are ideal sites for mold growth.  The next most common sites of mold growth are the bathroom and the kitchen.  Outdoor [employer] Use air conditioning and keep windows closed Avoid exposure to decaying vegetation. Avoid leaf raking. Avoid grain handling. Consider wearing a face mask if working in moldy areas.  Indoor Mold Control Maintain humidity below 50%. Clean washable surfaces with 5% bleach solution. Remove sources e.g. Contaminated carpets.  Control of Dog or Cat Allergen Avoidance is the best way to manage a dog or cat allergy. If you have a dog or cat and are allergic to dog or cats, consider removing the dog or cat from the home. If you have a dog or cat but dont want to find it a new home, or if your family wants a pet even though someone in the household is allergic, here are some strategies that may help keep symptoms at bay:  Keep the pet out of your bedroom and restrict it to only a few rooms. Be advised that keeping the dog or cat in only one room will not limit the allergens to that room. Dont pet, hug or kiss the dog or cat; if you do, wash your hands with soap and water. High-efficiency particulate air (HEPA) cleaners run continuously in a bedroom or living room can reduce allergen levels over time. Regular use of a high-efficiency vacuum cleaner or a central vacuum can reduce allergen levels. Giving your dog or cat a bath at least once a week can reduce airborne allergen.   Control of Dust Mite Allergen Dust mites play a major role in allergic asthma and rhinitis. They occur in environments with high humidity wherever human  skin is found.  Dust mites absorb humidity from the atmosphere (ie, they do not drink) and feed on organic matter (including shed human and animal skin). Dust mites are a microscopic type of insect that you cannot see with the naked eye. High levels of dust mites have been detected from mattresses, pillows, carpets, upholstered furniture, bed covers, clothes, soft toys and any woven material. The principal allergen of the dust mite is found in its feces. A gram of dust may contain 1,000 mites and 250,000 fecal particles. Mite antigen is easily measured in the air during house cleaning activities. Dust mites do not bite and do not cause harm to humans, other than by triggering allergies/asthma.  Ways to decrease your exposure to dust mites in your home:  1. Encase mattresses, box springs and pillows with a mite-impermeable barrier or cover  2. Wash sheets, blankets and drapes weekly in hot water (130 F) with detergent and dry them in a dryer on the hot setting.  3. Have the room cleaned frequently with a vacuum cleaner and a damp dust-mop. For carpeting or rugs, vacuuming with a vacuum cleaner equipped with a high-efficiency particulate air (HEPA) filter. The dust mite allergic individual should not be in a room which is being cleaned and should wait 1 hour after cleaning before going into the room.  4. Do not sleep on upholstered furniture (eg, couches).  5. If possible removing carpeting, upholstered furniture and drapery from the home is ideal. Horizontal blinds should be eliminated in the rooms where the person spends the most time (bedroom, study, television room). Washable vinyl, roller-type shades are optimal.  6. Remove all non-washable stuffed toys from the bedroom. Wash stuffed toys weekly like sheets and blankets above.  7. Reduce indoor humidity to less than 50%. Inexpensive humidity monitors can be purchased at most hardware stores. Do not use a humidifier as can make the problem worse  and are not recommended.   Lifestyle Changes for Controlling GERD When you have GERD, stomach acid feels as if its backing up toward your mouth. Whether or not you take medication to control your GERD, your symptoms can often be improved with lifestyle changes.   Raise Your Head Reflux is more likely to strike when youre lying down flat, because stomach fluid can flow backward more easily. Raising the head of your bed 4-6 inches can help. To do this: Slide blocks or books under the legs at the head of your bed. Or, place a wedge under the mattress. Many foam stores can make a suitable wedge for you. The wedge should run from your waist to the top of your head. Dont just prop your head on several pillows. This increases pressure on your stomach. It can make GERD worse.  Watch Your Eating Habits Certain foods may increase the acid in your stomach or relax the lower esophageal sphincter, making GERD more likely. Its best to avoid the following: Coffee, tea, and carbonated drinks (with and without caffeine) Fatty, fried, or spicy food Mint, chocolate, onions, and tomatoes Any other foods that seem to irritate your stomach or cause you pain  Relieve the Pressure Eat smaller meals, even if you have to eat more often. Dont lie down right after you eat. Wait a few hours for your stomach to empty. Avoid tight belts and tight-fitting clothes. Lose excess weight.  Tobacco and Alcohol Avoid smoking tobacco and drinking alcohol. They can make GERD symptoms worse.    Return in about 6 weeks (around 08/23/2021), or if symptoms  worsen or fail to improve.    Thank you for the opportunity to care for this patient.  Please do not hesitate to contact me with questions.  Althea Charon, FNP [facility]

## 2021-07-15 LAB — ALLERGENS, ZONE 2
Alternaria Alternata IgE: <0.1 kU/L
Amer Sycamore IgE Qn: <0.1 kU/L
Aspergillus Fumigatus IgE: <0.1 kU/L
Bahia Grass IgE: 0.1 kU/L — AB
Bermuda Grass IgE: 0.1 kU/L — AB
Cat Dander IgE: <0.1 kU/L
Cedar, Mountain IgE: <0.1 kU/L
Cladosporium Herbarum IgE: <0.1 kU/L
Cockroach, American IgE: <0.1 kU/L
Common Silver Birch IgE: <0.1 kU/L
D Farinae IgE: <0.1 kU/L
D Pteronyssinus IgE: <0.1 kU/L
Dog Dander IgE: <0.1 kU/L
Elm, American IgE: <0.1 kU/L
Hickory, White IgE: <0.1 kU/L
Johnson Grass IgE: <0.1 kU/L
Maple/Box Elder IgE: <0.1 kU/L
Mucor Racemosus IgE: <0.1 kU/L
Mugwort IgE Qn: <0.1 kU/L
Nettle IgE: <0.1 kU/L
Oak, White IgE: 0.1 kU/L — AB
Penicillium Chrysogen IgE: <0.1 kU/L
Pigweed, Rough IgE: <0.1 kU/L
Plantain, English IgE: <0.1 kU/L
Ragweed, Short IgE: <0.1 kU/L
Sheep Sorrel IgE Qn: <0.1 kU/L
Stemphylium Herbarum IgE: <0.1 kU/L
Sweet gum IgE RAST Ql: <0.1 kU/L
Timothy Grass IgE: <0.1 kU/L
White Mulberry IgE: <0.1 kU/L

## 2021-07-15 LAB — CBC WITH DIFFERENTIAL
Basophils Absolute: 0 10*3/uL (ref 0.0–0.2)
Basos: 0 %
EOS (ABSOLUTE): 0.4 10*3/uL (ref 0.0–0.4)
Eos: 6 %
Hematocrit: 44.1 % (ref 37.5–51.0)
Hemoglobin: 14.9 g/dL (ref 13.0–17.7)
Immature Grans (Abs): 0 10*3/uL (ref 0.0–0.1)
Immature Granulocytes: 1 %
Lymphocytes Absolute: 1.3 10*3/uL (ref 0.7–3.1)
Lymphs: 20 %
MCH: 28.7 pg (ref 26.6–33.0)
MCHC: 33.8 g/dL (ref 31.5–35.7)
MCV: 85 fL (ref 79–97)
Monocytes Absolute: 0.5 10*3/uL (ref 0.1–0.9)
Monocytes: 9 %
Neutrophils Absolute: 4 10*3/uL (ref 1.4–7.0)
Neutrophils: 64 %
RBC: 5.2 x10E6/uL (ref 4.14–5.80)
RDW: 12.8 % (ref 11.6–15.4)
WBC: 6.2 10*3/uL (ref 3.4–10.8)

## 2021-07-15 LAB — IGE: IgE (Immunoglobulin E), Serum: 193 IU/mL (ref 6–495)

## 2021-07-15 NOTE — Progress Notes (Signed)
Please let Peter Marsh know that his lab work shows that he is slightly allergic to grass pollen and oak tree. ? ?Also, his lab work does show that he has elevated eosinophils that can cause your asthma to flare. There are biologics such as Pura Spice, or Dupixent that we can use in the future if needed.  ? ?Make sure that he keeps his follow up appointment on 08/25/21 '@10'$ :45 AM with Dr. Ernst Bowler.

## 2021-07-19 ENCOUNTER — Encounter: Payer: Medicare Other | Admitting: Gastroenterology

## 2021-08-25 ENCOUNTER — Ambulatory Visit: Payer: Medicare Other | Admitting: Allergy & Immunology

## 2021-09-06 ENCOUNTER — Emergency Department (HOSPITAL_COMMUNITY): Payer: Medicare Other

## 2021-09-06 ENCOUNTER — Other Ambulatory Visit: Payer: Self-pay

## 2021-09-06 ENCOUNTER — Emergency Department (HOSPITAL_COMMUNITY)
Admission: EM | Admit: 2021-09-06 | Discharge: 2021-09-06 | Discharge status: Against Medical Advice | Payer: Medicare Other | Attending: Emergency Medicine | Admitting: Emergency Medicine

## 2021-09-06 DIAGNOSIS — J449 Chronic obstructive pulmonary disease, unspecified: Secondary | ICD-10-CM | POA: Insufficient documentation

## 2021-09-06 DIAGNOSIS — R0789 Other chest pain: Secondary | ICD-10-CM | POA: Diagnosis not present

## 2021-09-06 DIAGNOSIS — Z8616 Personal history of COVID-19: Secondary | ICD-10-CM | POA: Insufficient documentation

## 2021-09-06 DIAGNOSIS — Z5321 Procedure and treatment not carried out due to patient leaving prior to being seen by health care provider: Secondary | ICD-10-CM | POA: Diagnosis not present

## 2021-09-06 DIAGNOSIS — R0602 Shortness of breath: Secondary | ICD-10-CM | POA: Insufficient documentation

## 2021-09-06 DIAGNOSIS — Z950 Presence of cardiac pacemaker: Secondary | ICD-10-CM | POA: Insufficient documentation

## 2021-09-06 DIAGNOSIS — J45909 Unspecified asthma, uncomplicated: Secondary | ICD-10-CM | POA: Insufficient documentation

## 2021-09-06 DIAGNOSIS — Z20822 Contact with and (suspected) exposure to covid-19: Secondary | ICD-10-CM | POA: Insufficient documentation

## 2021-09-06 DIAGNOSIS — N189 Chronic kidney disease, unspecified: Secondary | ICD-10-CM | POA: Diagnosis not present

## 2021-09-06 LAB — CBC WITH DIFFERENTIAL/PLATELET
Abs Immature Granulocytes: 0.03 10*3/uL (ref 0.00–0.07)
Basophils Absolute: 0 10*3/uL (ref 0.0–0.1)
Basophils Relative: 0 %
Eosinophils Absolute: 0.4 10*3/uL (ref 0.0–0.5)
Eosinophils Relative: 5 %
HCT: 41.7 % (ref 39.0–52.0)
Hemoglobin: 13.4 g/dL (ref 13.0–17.0)
Immature Granulocytes: 0 %
Lymphocytes Relative: 20 %
Lymphs Abs: 1.5 10*3/uL (ref 0.7–4.0)
MCH: 28.5 pg (ref 26.0–34.0)
MCHC: 32.1 g/dL (ref 30.0–36.0)
MCV: 88.7 fL (ref 80.0–100.0)
Monocytes Absolute: 0.8 10*3/uL (ref 0.1–1.0)
Monocytes Relative: 10 %
Neutro Abs: 4.7 10*3/uL (ref 1.7–7.7)
Neutrophils Relative %: 65 %
Platelets: 198 10*3/uL (ref 150–400)
RBC: 4.7 MIL/uL (ref 4.22–5.81)
RDW: 12.5 % (ref 11.5–15.5)
WBC: 7.5 10*3/uL (ref 4.0–10.5)
nRBC: 0 % (ref 0.0–0.2)

## 2021-09-06 LAB — COMPREHENSIVE METABOLIC PANEL
ALT: 15 U/L (ref 0–44)
AST: 18 U/L (ref 15–41)
Albumin: 3.5 g/dL (ref 3.5–5.0)
Alkaline Phosphatase: 63 U/L (ref 38–126)
Anion gap: 5 (ref 5–15)
BUN: 27 mg/dL — ABNORMAL HIGH (ref 8–23)
CO2: 26 mmol/L (ref 22–32)
Calcium: 8.9 mg/dL (ref 8.9–10.3)
Chloride: 106 mmol/L (ref 98–111)
Creatinine, Ser: 2.25 mg/dL — ABNORMAL HIGH (ref 0.61–1.24)
GFR, Estimated: 32 mL/min — ABNORMAL LOW (ref >60)
Glucose, Bld: 100 mg/dL — ABNORMAL HIGH (ref 70–99)
Potassium: 4.5 mmol/L (ref 3.5–5.1)
Sodium: 137 mmol/L (ref 135–145)
Total Bilirubin: 0.5 mg/dL (ref 0.3–1.2)
Total Protein: 6.4 g/dL — ABNORMAL LOW (ref 6.5–8.1)

## 2021-09-06 LAB — RESP PANEL BY RT-PCR (FLU A&B, COVID) ARPGX2
Influenza A by PCR: NEGATIVE
Influenza B by PCR: NEGATIVE
SARS Coronavirus 2 by RT PCR: NEGATIVE

## 2021-09-06 LAB — LIPASE, BLOOD: Lipase: 39 U/L (ref 11–51)

## 2021-09-06 LAB — TROPONIN I (HIGH SENSITIVITY): Troponin I (High Sensitivity): 7 ng/L (ref <18)

## 2021-09-06 NOTE — ED Notes (Signed)
No answer for repeat trop. ?

## 2021-09-06 NOTE — ED Provider Triage Note (Signed)
Emergency Medicine Provider Triage Evaluation Note ? ?Peter Marsh , a 61 y.o. male  was evaluated in triage.  Pt complains of shortness of breath over the past 2 days.  Fairly constant, worse with activity.  Having associated chest pressure, nausea, and abdominal discomfort.  Tried using his inhaler without relief.  Did have some congestion and cough a few days ago but this seemed to resolve. ? ?Review of Systems  ?Positive: Dyspnea, chest pressure, nausea, abdominal discomfort.  Resolved congestion and cough ?Negative: Hemoptysis, diarrhea, melena, syncope ? ?Physical Exam  ?BP (!) 153/106 (BP Location: Left Arm)   Pulse 85   Temp 98 ?F (36.7 ?C)   Resp (!) 22   SpO2 98%  ?Gen:   Awake, no distress   ?Resp:  Normal effort, no wheezing. ?MSK:   No significant pitting edema to the lower legs. ?Other:  No peritoneal signs on abdominal exam. ? ?Medical Decision Making  ?Medically screening exam initiated at 1:55 AM.  Appropriate orders placed.  Peter Marsh was informed that the remainder of the evaluation will be completed by another provider, this initial triage assessment does not replace that evaluation, and the importance of remaining in the ED until their evaluation is complete. ? ?Dyspnea. ?  Amaryllis Dyke, PA-C ?09/06/21 0158 ? ?

## 2021-09-06 NOTE — ED Triage Notes (Signed)
Pt c/o SHOB x2-3 days. Hx CKD, pacemaker, COPD, asthma. Endorses associated chest tightness, "like I can't get a full breath." Denies sick symptoms, no sick contacts. Compliant w all home medications, rescue inhaler "didn't help much." ?

## 2021-09-26 ENCOUNTER — Ambulatory Visit (INDEPENDENT_AMBULATORY_CARE_PROVIDER_SITE_OTHER): Payer: Medicare Other

## 2021-09-26 DIAGNOSIS — I441 Atrioventricular block, second degree: Secondary | ICD-10-CM

## 2021-09-27 LAB — CUP PACEART REMOTE DEVICE CHECK
Battery Remaining Longevity: 90 mo
Battery Voltage: 3 V
Brady Statistic AP VP Percent: 0.15 %
Brady Statistic AP VS Percent: 95.94 %
Brady Statistic AS VP Percent: 0 %
Brady Statistic AS VS Percent: 3.91 %
Brady Statistic RA Percent Paced: 96.11 %
Brady Statistic RV Percent Paced: 0.15 %
Date Time Interrogation Session: 20230522033723
Implantable Lead Implant Date: 20181022
Implantable Lead Implant Date: 20181022
Implantable Lead Location: 753859
Implantable Lead Location: 753860
Implantable Lead Model: 3830
Implantable Lead Model: 5076
Implantable Pulse Generator Implant Date: 20181022
Lead Channel Impedance Value: 323 Ohm
Lead Channel Impedance Value: 361 Ohm
Lead Channel Impedance Value: 475 Ohm
Lead Channel Impedance Value: 551 Ohm
Lead Channel Pacing Threshold Amplitude: 0.75 V
Lead Channel Pacing Threshold Amplitude: 1 V
Lead Channel Pacing Threshold Pulse Width: 0.4 ms
Lead Channel Pacing Threshold Pulse Width: 0.4 ms
Lead Channel Sensing Intrinsic Amplitude: 11.875 mV
Lead Channel Sensing Intrinsic Amplitude: 11.875 mV
Lead Channel Sensing Intrinsic Amplitude: 3.625 mV
Lead Channel Sensing Intrinsic Amplitude: 3.625 mV
Lead Channel Setting Pacing Amplitude: 1.5 V
Lead Channel Setting Pacing Amplitude: 3.5 V
Lead Channel Setting Pacing Pulse Width: 1 ms
Lead Channel Setting Sensing Sensitivity: 1.2 mV

## 2021-09-28 ENCOUNTER — Emergency Department (HOSPITAL_COMMUNITY): Payer: Medicare Other

## 2021-09-28 ENCOUNTER — Encounter (HOSPITAL_COMMUNITY): Payer: Self-pay

## 2021-09-28 ENCOUNTER — Emergency Department (HOSPITAL_COMMUNITY)
Admission: EM | Admit: 2021-09-28 | Discharge: 2021-09-28 | Disposition: A | Discharge status: Home / Self Care | Payer: Medicare Other | Attending: Emergency Medicine | Admitting: Emergency Medicine

## 2021-09-28 ENCOUNTER — Other Ambulatory Visit: Payer: Self-pay

## 2021-09-28 DIAGNOSIS — Z79899 Other long term (current) drug therapy: Secondary | ICD-10-CM | POA: Insufficient documentation

## 2021-09-28 DIAGNOSIS — I10 Essential (primary) hypertension: Secondary | ICD-10-CM | POA: Diagnosis not present

## 2021-09-28 DIAGNOSIS — R072 Precordial pain: Secondary | ICD-10-CM

## 2021-09-28 DIAGNOSIS — R079 Chest pain, unspecified: Secondary | ICD-10-CM | POA: Diagnosis present

## 2021-09-28 LAB — COMPREHENSIVE METABOLIC PANEL
ALT: 14 U/L (ref 0–44)
AST: 18 U/L (ref 15–41)
Albumin: 3.8 g/dL (ref 3.5–5.0)
Alkaline Phosphatase: 77 U/L (ref 38–126)
Anion gap: 7 (ref 5–15)
BUN: 22 mg/dL (ref 8–23)
CO2: 24 mmol/L (ref 22–32)
Calcium: 9.1 mg/dL (ref 8.9–10.3)
Chloride: 104 mmol/L (ref 98–111)
Creatinine, Ser: 2.26 mg/dL — ABNORMAL HIGH (ref 0.61–1.24)
GFR, Estimated: 32 mL/min — ABNORMAL LOW (ref >60)
Glucose, Bld: 106 mg/dL — ABNORMAL HIGH (ref 70–99)
Potassium: 4.7 mmol/L (ref 3.5–5.1)
Sodium: 135 mmol/L (ref 135–145)
Total Bilirubin: 0.9 mg/dL (ref 0.3–1.2)
Total Protein: 7.1 g/dL (ref 6.5–8.1)

## 2021-09-28 LAB — CBC
HCT: 44.6 % (ref 39.0–52.0)
Hemoglobin: 14.9 g/dL (ref 13.0–17.0)
MCH: 29.1 pg (ref 26.0–34.0)
MCHC: 33.4 g/dL (ref 30.0–36.0)
MCV: 87.1 fL (ref 80.0–100.0)
Platelets: 169 10*3/uL (ref 150–400)
RBC: 5.12 MIL/uL (ref 4.22–5.81)
RDW: 12.6 % (ref 11.5–15.5)
WBC: 8 10*3/uL (ref 4.0–10.5)
nRBC: 0 % (ref 0.0–0.2)

## 2021-09-28 LAB — TROPONIN I (HIGH SENSITIVITY): Troponin I (High Sensitivity): 6 ng/L (ref <18)

## 2021-09-28 MED ORDER — FAMOTIDINE 20 MG PO TABS
20.0000 mg | ORAL_TABLET | Freq: Once | ORAL | Status: AC
Start: 2021-09-28 — End: 2021-09-28
  Administered 2021-09-28: 20 mg via ORAL
  Filled 2021-09-28: qty 1

## 2021-09-28 MED ORDER — ALUM & MAG HYDROXIDE-SIMETH 200-200-20 MG/5ML PO SUSP
30.0000 mL | Freq: Once | ORAL | Status: AC
  Administered 2021-09-28: 30 mL via ORAL
  Filled 2021-09-28: qty 30

## 2021-09-28 MED ORDER — ACETAMINOPHEN 500 MG PO TABS
1000.0000 mg | ORAL_TABLET | Freq: Once | ORAL | Status: AC
  Administered 2021-09-28: 1000 mg via ORAL
  Filled 2021-09-28: qty 2

## 2021-09-28 NOTE — ED Triage Notes (Signed)
Pt arrived POV from home c/o left sided CP x3 days. Pt states he does have pacemaker and is not sure if it is related to that. Pt also endorses feeling sick for several days. Pt denies any SHOB or vomiting.

## 2021-09-28 NOTE — ED Provider Notes (Signed)
[facility] EMERGENCY DEPARTMENT Provider Note   CSN: [id number] Arrival date & time: 09/28/21  0825     History  Chief Complaint  Patient presents with   Chest Pain    Peter Marsh is a 61 y.o. male.  Patient with hx pacemaker, c/o mid chest pain in past three days. Symptoms occur at rest, intermittent, last hours at a time, dull, non radiating, not pleuritic. No exertional cp or discomfort. No associated sob, nv or diaphoresis. +some recent indigestion/heartburn. Denies hx cad or fam hx cad. No cough or uri symptoms. No fever or chills. No leg pain or swelling.   The history is provided by the patient and medical records.  Chest Pain Associated symptoms: no abdominal pain, no back pain, no cough, no fever, no headache, no shortness of breath and no vomiting       Home Medications Prior to Admission medications   Medication Sig Start Date End Date Taking? Authorizing Provider  acetaminophen (TYLENOL) 325 MG tablet Take 650 mg by mouth every 6 (six) hours as needed for mild pain or headache.    [provider]  albuterol (VENTOLIN HFA) 108 (90 Base) MCG/ACT inhaler INHALE 2 PUFFS INTO THE LUNGS EVERY 6 HOURS AS NEEDED FOR WHEEZING OR SHORTNESS OF BREATH 07/11/21   Althea Charon, FNP  Budeson-Glycopyrrol-Formoterol (BREZTRI AEROSPHERE) 160-9-4.8 MCG/ACT AERO 2 puffs twice a day with a spacer 09/08/20   Ambs, Kathrine Cords, FNP  Budeson-Glycopyrrol-Formoterol (BREZTRI AEROSPHERE) 160-9-4.8 MCG/ACT AERO Inhale 2 puffs into the lungs in the morning and at bedtime. 12/09/20   Dara Hoyer, FNP  buPROPion (WELLBUTRIN SR) 150 MG 12 hr tablet Take 150 mg by mouth 2 (two) times daily.    [provider]  cetirizine (ZYRTEC) 10 MG tablet Take 1 tablet (10 mg total) by mouth daily. 12/09/20   Dara Hoyer, FNP  cloNIDine (CATAPRES) 0.2 MG tablet Take 1 tablet (0.2 mg total) by mouth 3 (three) times daily. 01/05/21   Evans Lance, MD  Cyanocobalamin (VITAMIN B-12  PO) Take 1 tablet by mouth daily.    [provider]  docusate sodium (COLACE) 100 MG capsule Take 1 capsule (100 mg total) by mouth 2 (two) times daily. 10/25/20   Thurnell Lose, MD  ezetimibe (ZETIA) 10 MG tablet Take 1 tablet (10 mg total) by mouth daily. 05/21/20 11/04/20  O'NealCassie Freer, MD  Fenofibrate 50 MG CAPS Take 1 capsule (50 mg total) by mouth daily. 02/17/20 02/16/21  Vevelyn Francois, NP  fluticasone (FLONASE) 50 MCG/ACT nasal spray Place 1 spray into both nostrils daily as needed for allergies. 09/08/20   Dara Hoyer, FNP  furosemide (LASIX) 20 MG tablet Take 1 tablet (20 mg total) by mouth daily. 07/15/20   O'NealCassie Freer, MD  gabapentin (NEURONTIN) 100 MG capsule Take 1 capsule (100 mg total) by mouth 3 (three) times daily. 09/08/19   Marcial Pacas, MD  hydrALAZINE (APRESOLINE) 50 MG tablet Take 1 tablet (50 mg total) by mouth 3 (three) times daily. 04/15/20   O'NealCassie Freer, MD  isosorbide dinitrate (ISORDIL) 30 MG tablet TAKE 1 TABLET(30 MG) BY MOUTH FOUR TIMES DAILY 03/21/21   O'Neal, Cassie Freer, MD  metoprolol tartrate (LOPRESSOR) 100 MG tablet Take 1 tablet (100 mg total) by mouth 2 (two) times daily. 04/15/20   Geralynn Rile, MD  montelukast (SINGULAIR) 10 MG tablet Take 1 tablet (10 mg total) by mouth at bedtime. 12/09/20 03/09/21  Gareth Morgan  M, FNP  ondansetron (ZOFRAN ODT) 4 MG disintegrating tablet Take 1 tablet (4 mg total) by mouth every 8 (eight) hours as needed for nausea or vomiting. 10/25/20   Thurnell Lose, MD  ondansetron (ZOFRAN ODT) 4 MG disintegrating tablet Take 1 tablet (4 mg total) by mouth every 8 (eight) hours as needed for nausea or vomiting. 12/17/20   Thornton Park, MD  paliperidone (INVEGA) 6 MG 24 hr tablet Take 6 mg by mouth at bedtime.    [provider]  Pancrelipase, Lip-Prot-Amyl, (ZENPEP) 40000-126000 units CPEP Take 1 tablet by mouth with breakfast, with lunch, and with evening meal. 04/12/20   Thornton Park, MD  pantoprazole (PROTONIX) 40 MG tablet TAKE 1 TABLET(40 MG) BY MOUTH TWICE DAILY 07/06/21   Thornton Park, MD  PEG-KCl-NaCl-NaSulf-Na Asc-C (PLENVU) 140 g SOLR Take 140 g by mouth as directed. Manufacturer's coupon Universal coupon code:BIN: P2366821; GROUP: [id number]; PCN: [id number]; ID: [id number]; PAY NO MORE $50 12/17/20   Thornton Park, MD  Spacer/Aero-Holding [facility] Use as directed with inhaler. 07/12/21   Althea Charon, FNP  sucralfate (CARAFATE) 1 GM/10ML suspension Take 10 mLs (1 g total) by mouth 4 (four) times daily -  with meals and at bedtime. 10/25/20   Thurnell Lose, MD      Allergies    Methylpyrrolidone, Niacin, Ace inhibitors, Norvasc [amlodipine besylate], Other, Oxybutynin chloride [oxybutynin chloride er], Vesicare [solifenacin succinate], Ciprofloxacin, Oxybutynin, and Solifenacin    Review of Systems   Review of Systems  Constitutional:  Negative for fever.  HENT:  Negative for sore throat.   Eyes:  Negative for redness.  Respiratory:  Negative for cough and shortness of breath.   Cardiovascular:  Positive for chest pain. Negative for leg swelling.  Gastrointestinal:  Negative for abdominal pain and vomiting.  Genitourinary:  Negative for flank pain.  Musculoskeletal:  Negative for back pain and neck pain.  Skin:  Negative for rash.  Neurological:  Negative for headaches.  Hematological:  Does not bruise/bleed easily.  Psychiatric/Behavioral:  Negative for confusion.    Physical Exam Updated Vital Signs BP (!) 170/100   Pulse (!) 59   Temp 98.1 F (36.7 C) (Oral)   Resp 15   Ht 1.854 m ('6\' 1"'$ )   Wt 94.8 kg   SpO2 100%   BMI 27.57 kg/m  Physical Exam Vitals and nursing note reviewed.  Constitutional:      Appearance: Normal appearance. He is well-developed.  HENT:     Head: Atraumatic.     Nose: Nose normal.     Mouth/Throat:     Mouth: Mucous membranes are moist.     Pharynx: Oropharynx is clear.  Eyes:     General: No  scleral icterus.    Conjunctiva/sclera: Conjunctivae normal.  Neck:     Trachea: No tracheal deviation.  Cardiovascular:     Rate and Rhythm: Normal rate and regular rhythm.     Pulses: Normal pulses.     Heart sounds: Normal heart sounds. No murmur heard.   No friction rub. No gallop.  Pulmonary:     Effort: Pulmonary effort is normal. No accessory muscle usage or respiratory distress.     Breath sounds: Normal breath sounds.  Abdominal:     General: Bowel sounds are normal. There is no distension.     Palpations: Abdomen is soft.     Tenderness: There is no abdominal tenderness. There is no guarding.  Genitourinary:    Comments: No cva tenderness. Musculoskeletal:  General: No swelling or tenderness.     Cervical back: Normal range of motion and neck supple. No rigidity.     Right lower leg: No edema.     Left lower leg: No edema.  Skin:    General: Skin is warm and dry.     Findings: No rash.  Neurological:     Mental Status: He is alert.     Comments: Alert, speech clear.   Psychiatric:        Mood and Affect: Mood normal.    ED Results / Procedures / Treatments   Labs (all labs ordered are listed, but only abnormal results are displayed) Results for orders placed or performed during the hospital encounter of 09/28/21  CBC  Result Value Ref Range   WBC 8.0 4.0 - 10.5 K/uL   RBC 5.12 4.22 - 5.81 MIL/uL   Hemoglobin 14.9 13.0 - 17.0 g/dL   HCT 44.6 39.0 - 52.0 %   MCV 87.1 80.0 - 100.0 fL   MCH 29.1 26.0 - 34.0 pg   MCHC 33.4 30.0 - 36.0 g/dL   RDW 12.6 11.5 - 15.5 %   Platelets 169 150 - 400 K/uL   nRBC 0.0 0.0 - 0.2 %  Comprehensive metabolic panel  Result Value Ref Range   Sodium 135 135 - 145 mmol/L   Potassium 4.7 3.5 - 5.1 mmol/L   Chloride 104 98 - 111 mmol/L   CO2 24 22 - 32 mmol/L   Glucose, Bld 106 (H) 70 - 99 mg/dL   BUN 22 8 - 23 mg/dL   Creatinine, Ser 2.26 (H) 0.61 - 1.24 mg/dL   Calcium 9.1 8.9 - 10.3 mg/dL   Total Protein 7.1 6.5 -  8.1 g/dL   Albumin 3.8 3.5 - 5.0 g/dL   AST 18 15 - 41 U/L   ALT 14 0 - 44 U/L   Alkaline Phosphatase 77 38 - 126 U/L   Total Bilirubin 0.9 0.3 - 1.2 mg/dL   GFR, Estimated 32 (L) >60 mL/min   Anion gap 7 5 - 15  Troponin I (High Sensitivity)  Result Value Ref Range   Troponin I (High Sensitivity) 6 <18 ng/L   DG Chest 2 View  Result Date: 09/06/2021 CLINICAL DATA:  Chest pain EXAM: CHEST - 2 VIEW COMPARISON:  10/19/2020 FINDINGS: Cardiac shadow is within normal limits. Pacing device is again seen. Lungs are well aerated bilaterally. No focal infiltrate or effusion is seen. No bony abnormality is noted. IMPRESSION: No active cardiopulmonary disease. Electronically Signed   By: Inez Catalina M.D.   On: 09/06/2021 02:28   CUP PACEART REMOTE DEVICE CHECK  Result Date: 09/27/2021 Scheduled remote reviewed. Normal device function.  Next remote 91 days. LA     EKG EKG Interpretation  Date/Time:  Wednesday Sep 28 2021 08:39:15 EDT Ventricular Rate:  61 PR Interval:    QRS Duration: 176 QT Interval:  444 QTC Calculation: 448 R Axis:   68 Text Interpretation: AV dual-paced complexes Confirmed by Lajean Saver [phone]) on 09/28/2021 8:48:23 AM  Radiology DG Chest Port 1 View  Result Date: 09/28/2021 CLINICAL DATA:  Chest pain EXAM: PORTABLE CHEST 1 VIEW COMPARISON:  09/06/2021 FINDINGS: No new consolidation or edema. No pleural effusion or pneumothorax. Stable cardiomediastinal contours. Left chest wall dual lead pacemaker. IMPRESSION: No acute process in the chest. Electronically Signed   By: Macy Mis M.D.   On: 09/28/2021 09:30    Procedures Procedures    Medications Ordered in  ED Medications  famotidine (PEPCID) tablet 20 mg (has no administration in time range)  acetaminophen (TYLENOL) tablet 1,000 mg (has no administration in time range)  alum & mag hydroxide-simeth (MAALOX/MYLANTA) 200-200-20 MG/5ML suspension 30 mL (has no administration in time range)    ED Course/  Medical Decision Making/ A&P                           Medical Decision Making Problems Addressed: Essential hypertension: chronic illness or injury with exacerbation, progression, or side effects of treatment that poses a threat to life or bodily functions Precordial chest pain: acute illness or injury with systemic symptoms that poses a threat to life or bodily functions  Amount and/or Complexity of Data Reviewed External Data Reviewed: notes. Labs: ordered. Decision-making details documented in ED Course. Radiology: ordered and independent interpretation performed. Decision-making details documented in ED Course. ECG/medicine tests: ordered and independent interpretation performed. Decision-making details documented in ED Course.  Risk OTC drugs. Decision regarding hospitalization.   Iv ns. Continuous pulse ox and cardiac monitoring. Labs ordered/sent. Imaging ordered.   Differential diagnosis includes stemi, acs, msk cp, gerd, etc. Disposition decision including potential need for admission considered - will get labs and ecg and reassess.   Reviewed nursing notes and prior charts for additional history. External reports reviewed.   Cardiac monitor: sinus rhythm, rate 64.  Labs reviewed/interpreted by me - trop normal - after symptoms present for past three days, felt not c/w acs.   Xrays reviewed/interpreted by me - no pna.  Acetaminophen po, pepcid po, maalox po.  Chest discomfort resolved, pt indicates feels ready for d/c.  Rec close pcp/card f/u for cp as well as bp which currently appears not well controlled.          Final Clinical Impression(s) / ED Diagnoses Final diagnoses:  None    Rx / DC Orders ED Discharge Orders     None         Lajean Saver, MD 09/28/21 1039

## 2021-09-28 NOTE — Discharge Instructions (Addendum)
It was our pleasure to provide your ER care today - we hope that you feel better.  For recent chest pain, follow up closely with your cardiologist in the next 1-2 weeks - call office to arrange/confirm appointment time.  Also follow up closely with your doctor regarding your blood pressure, that is high today.   Return to ER if worse, new symptoms, fevers, recurrent/persistent chest pain, trouble breathing, or other concern.

## 2021-10-12 NOTE — Progress Notes (Signed)
Remote pacemaker transmission.   

## 2021-10-28 ENCOUNTER — Ambulatory Visit: Payer: Medicare Other | Admitting: Cardiovascular Disease

## 2021-11-22 ENCOUNTER — Ambulatory Visit (HOSPITAL_COMMUNITY)
Admission: EM | Admit: 2021-11-22 | Discharge: 2021-11-22 | Disposition: A | Discharge status: Home / Self Care | Payer: Medicare Other

## 2021-11-25 ENCOUNTER — Encounter: Payer: Self-pay | Admitting: Family Medicine

## 2021-11-26 ENCOUNTER — Ambulatory Visit (HOSPITAL_COMMUNITY)
Admission: EM | Admit: 2021-11-26 | Discharge: 2021-11-26 | Disposition: A | Discharge status: Home / Self Care | Payer: Medicare Other | Attending: Registered Nurse | Admitting: Registered Nurse

## 2021-11-26 ENCOUNTER — Encounter (HOSPITAL_COMMUNITY): Payer: Self-pay | Admitting: Registered Nurse

## 2021-11-26 DIAGNOSIS — F250 Schizoaffective disorder, bipolar type: Secondary | ICD-10-CM | POA: Diagnosis present

## 2021-11-26 DIAGNOSIS — Z592 Discord with neighbors, lodgers and landlord: Secondary | ICD-10-CM

## 2021-11-26 HISTORY — DX: Discord with neighbors, lodgers and landlord: Z59.2
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
## 2021-11-26 NOTE — ED Notes (Signed)
Patient A&O x 4, ambulatory. Patient discharged in no acute distress. Patient denied SI/HI, A/VH upon discharge. Patient verbalized understanding of all discharge instructions explained by staff, to include follow up appointments and safety plan. Pt and his wife were provided education on importance of adhering to BP medication regimen. Pt and his wife stated, "We are going home right now so he can take his BP medicine".Pt asymptomatic at present. Patient escorted to lobby via staff. Safety maintained.
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
## 2021-12-26 ENCOUNTER — Ambulatory Visit (INDEPENDENT_AMBULATORY_CARE_PROVIDER_SITE_OTHER): Payer: Medicare Other

## 2021-12-26 DIAGNOSIS — I441 Atrioventricular block, second degree: Secondary | ICD-10-CM

## 2021-12-27 LAB — CUP PACEART REMOTE DEVICE CHECK
Battery Remaining Longevity: 89 mo
Battery Voltage: 2.99 V
Brady Statistic AP VP Percent: 0.21 %
Brady Statistic AP VS Percent: 97.78 %
Brady Statistic AS VP Percent: 0 %
Brady Statistic AS VS Percent: 2.01 %
Brady Statistic RA Percent Paced: 98 %
Brady Statistic RV Percent Paced: 0.21 %
Date Time Interrogation Session: 20230820230226
Implantable Lead Implant Date: 20181022
Implantable Lead Implant Date: 20181022
Implantable Lead Location: 753859
Implantable Lead Location: 753860
Implantable Lead Model: 3830
Implantable Lead Model: 5076
Implantable Pulse Generator Implant Date: 20181022
Lead Channel Impedance Value: 323 Ohm
Lead Channel Impedance Value: 323 Ohm
Lead Channel Impedance Value: 475 Ohm
Lead Channel Impedance Value: 513 Ohm
Lead Channel Pacing Threshold Amplitude: 0.75 V
Lead Channel Pacing Threshold Amplitude: 1 V
Lead Channel Pacing Threshold Pulse Width: 0.4 ms
Lead Channel Pacing Threshold Pulse Width: 0.4 ms
Lead Channel Sensing Intrinsic Amplitude: 3.5 mV
Lead Channel Sensing Intrinsic Amplitude: 3.5 mV
Lead Channel Sensing Intrinsic Amplitude: 9.25 mV
Lead Channel Sensing Intrinsic Amplitude: 9.25 mV
Lead Channel Setting Pacing Amplitude: 1.5 V
Lead Channel Setting Pacing Amplitude: 3.5 V
Lead Channel Setting Pacing Pulse Width: 1 ms
Lead Channel Setting Sensing Sensitivity: 1.2 mV

## 2022-01-17 NOTE — Progress Notes (Unsigned)
Electrophysiology Office Note Date: 01/18/2022  ID:  Peter Marsh, DOB 05/12/1960, MRN [id number]  PCP: Bernerd Limbo, MD Primary Cardiologist: Sanda Klein, MD Electrophysiologist: Cristopher Peru, MD   CC: Pacemaker follow-up  Peter Marsh is a 61 y.o. male seen today for Cristopher Peru, MD for routine electrophysiology followup.  last being seen in our clinic the patient reports doing very well.  he denies chest pain, palpitations, dyspnea, PND, orthopnea, nausea, vomiting, dizziness, syncope, edema, weight gain, or early satiety.   Device History: Medtronic Dual Chamber PPM implanted 02/2017 for stokes adams syncope  Past Medical History:  Diagnosis Date   Allergy    Anxiety    Asthma    uses inhalers    Bilateral carotid bruits    Cardiac conduction disorder 2018   s/p MDT PPM   Cataract    Chronic kidney disease    bladder interstial cystitis    Chronic kidney disease (CKD), stage IV (severe) ([name])    followed by Dr. Joelyn Oms at [facility]   COPD (chronic obstructive pulmonary disease) ([place])    Depression    Encounter for care of pacemaker 02/13/2019   GERD (gastroesophageal reflux disease)    History of stomach ulcers 2001   Hypertension    LBBB (left bundle branch block)    Lower extremity edema    Mild intermittent asthma without complication    Mixed hyperlipidemia    Mobitz type 2 second degree AV block 04/06/2019   Neuropathy    Pacemaker: Medtronic Azure XT DR MRI [id number] PPM -  BUNDLE OF HIS pacing  02/26/2017   Scheduled Remote pacemaker check  11/12/2018:  There were 24 Fast AV episodes:  EGMs show SVTs. Episodes lasted < 2 minutes. Health trends do not demonstrate significant abnormality. Battery longevity is 9.4 - 10.3 years. RA pacing is 47.1 %, RV pacing is 40.4 %.  Clinic check 11/07/17.    Paroxysmal atrial flutter ([name])    PONV (postoperative nausea and vomiting)    Prostatitis    Recurrent upper respiratory infection (URI)     Schizophrenia ([name])    Sinus node dysfunction ([place]) 02/13/2019   Urticaria    Past Surgical History:  Procedure Laterality Date   ADENOIDECTOMY     APPENDECTOMY     COLONOSCOPY     ELECTROPHYSIOLOGY STUDY N/A 02/26/2017   Procedure: ELECTROPHYSIOLOGY STUDY;  Surgeon: Evans Lance, MD;  Location: [place] [facility];  Service: Cardiovascular;  Laterality: N/A;   ESOPHAGOGASTRODUODENOSCOPY (EGD) WITH PROPOFOL N/A 09/18/2016   Procedure: ESOPHAGOGASTRODUODENOSCOPY (EGD) WITH PROPOFOL;  Surgeon: Ronnette Juniper, MD;  Location: [facility];  Service: Gastroenterology;  Laterality: N/A;   LUMBAR LAMINECTOMY/DECOMPRESSION MICRODISCECTOMY  04/19/2011   Procedure: LUMBAR LAMINECTOMY/DECOMPRESSION MICRODISCECTOMY;  Surgeon: Tobi Bastos;  Location: [facility];  Service: Orthopedics;  Laterality: Left;  Hemi LAminectomy/Microdiscectomy Lumbar four  - Lumbar five  on the Left (X-Ray)   PACEMAKER IMPLANT N/A 02/26/2017   Procedure: PACEMAKER IMPLANT;  Surgeon: Evans Lance, MD;  Location: [place] [facility];  Service: Cardiovascular;  Laterality: N/A;   TONSILLECTOMY     UPPER GASTROINTESTINAL ENDOSCOPY      Current Outpatient Medications  Medication Sig Dispense Refill   acetaminophen (TYLENOL) 325 MG tablet Take 650 mg by mouth every 6 (six) hours as needed for mild pain or headache.     albuterol (VENTOLIN HFA) 108 (90 Base) MCG/ACT inhaler INHALE 2 PUFFS INTO THE LUNGS EVERY 6 HOURS AS NEEDED FOR WHEEZING OR SHORTNESS  OF BREATH 18 g 0   Budeson-Glycopyrrol-Formoterol (BREZTRI AEROSPHERE) 160-9-4.8 MCG/ACT AERO 2 puffs twice a day with a spacer 10.7 g 2   Budeson-Glycopyrrol-Formoterol (BREZTRI AEROSPHERE) 160-9-4.8 MCG/ACT AERO Inhale 2 puffs into the lungs in the morning and at bedtime. 10.7 g 5   buPROPion (WELLBUTRIN SR) 150 MG 12 hr tablet Take 150 mg by mouth 2 (two) times daily.     cetirizine (ZYRTEC) 10 MG tablet Take 1 tablet (10 mg total) by mouth daily. 30 tablet 5   cloNIDine  (CATAPRES) 0.2 MG tablet Take 1 tablet (0.2 mg total) by mouth 3 (three) times daily. 270 tablet 3   Cyanocobalamin (VITAMIN B-12 PO) Take 1 tablet by mouth daily.     docusate sodium (COLACE) 100 MG capsule Take 1 capsule (100 mg total) by mouth 2 (two) times daily. 60 capsule 0   fluticasone (FLONASE) 50 MCG/ACT nasal spray Place 1 spray into both nostrils daily as needed for allergies. 11.1 mL 3   furosemide (LASIX) 20 MG tablet Take 1 tablet (20 mg total) by mouth daily. 30 tablet 3   gabapentin (NEURONTIN) 100 MG capsule Take 1 capsule (100 mg total) by mouth 3 (three) times daily. 90 capsule 11   hydrALAZINE (APRESOLINE) 50 MG tablet Take 1 tablet (50 mg total) by mouth 3 (three) times daily. 270 tablet 1   isosorbide dinitrate (ISORDIL) 30 MG tablet TAKE 1 TABLET(30 MG) BY MOUTH FOUR TIMES DAILY 360 tablet 1   metoprolol tartrate (LOPRESSOR) 100 MG tablet Take 1 tablet (100 mg total) by mouth 2 (two) times daily. 180 tablet 1   ondansetron (ZOFRAN ODT) 4 MG disintegrating tablet Take 1 tablet (4 mg total) by mouth every 8 (eight) hours as needed for nausea or vomiting. 20 tablet 0   ondansetron (ZOFRAN ODT) 4 MG disintegrating tablet Take 1 tablet (4 mg total) by mouth every 8 (eight) hours as needed for nausea or vomiting. 10 tablet 0   paliperidone (INVEGA) 6 MG 24 hr tablet Take 6 mg by mouth at bedtime.     Pancrelipase, Lip-Prot-Amyl, (ZENPEP) 40000-126000 units CPEP Take 1 tablet by mouth with breakfast, with lunch, and with evening meal. 90 capsule 2   pantoprazole (PROTONIX) 40 MG tablet TAKE 1 TABLET(40 MG) BY MOUTH TWICE DAILY 60 tablet 5   PEG-KCl-NaCl-NaSulf-Na Asc-C (PLENVU) 140 g SOLR Take 140 g by mouth as directed. Manufacturer's coupon Universal coupon code:BIN: P2366821; GROUP: [id number]; PCN: [id number]; ID: [id number]; PAY NO MORE $50 1 each 0   Spacer/Aero-Holding Dorise Bullion Use as directed with inhaler. 1 each 0   sucralfate (CARAFATE) 1 GM/10ML suspension Take 10 mLs (1 g  total) by mouth 4 (four) times daily -  with meals and at bedtime. 3600 mL 3   ezetimibe (ZETIA) 10 MG tablet Take 1 tablet (10 mg total) by mouth daily. 90 tablet 3   Fenofibrate 50 MG CAPS Take 1 capsule (50 mg total) by mouth daily. 30 capsule 11   montelukast (SINGULAIR) 10 MG tablet Take 1 tablet (10 mg total) by mouth at bedtime. 90 tablet 1   No current facility-administered medications for this visit.   Facility-Administered Medications Ordered in Other Visits  Medication Dose Route Frequency Provider Last Rate Last Admin   acetaminophen (OFIRMEV) IVPB    PRN Lissa Morales, CRNA   1,000 mg at 04/19/11 0910   glycopyrrolate (ROBINUL) injection    PRN Lissa Morales, CRNA   0.8 mg at 04/19/11 1037    Allergies:  Methylpyrrolidone, Niacin, Ace inhibitors, Norvasc [amlodipine besylate], Other, Oxybutynin chloride [oxybutynin chloride er], Vesicare [solifenacin succinate], Ciprofloxacin, Oxybutynin, and Solifenacin   Social History: Social History   Socioeconomic History   Marital status: Married    Spouse name: Not on file   Number of children: 1   Years of education: 12   Highest education level: High school graduate  Occupational History   Occupation: Retired  Tobacco Use   Smoking status: Former    Packs/day: 1.00    Years: 30.00    Total pack years: 30.00    Types: Cigarettes    Quit date: 05/08/2008    Years since quitting: 13.7   Smokeless tobacco: Never  Vaping Use   Vaping Use: Never used  Substance and Sexual Activity   Alcohol use: No   Drug use: No   Sexual activity: Never  Other Topics Concern   Not on file  Social History Narrative   Lives at home with wife.   Right-handed.   One cup caffeine per day.   Social Determinants of Health   Financial Resource Strain: Not on file  Food Insecurity: Not on file  Transportation Needs: Not on file  Physical Activity: Not on file  Stress: Not on file  Social Connections: Not on file  Intimate Partner  Violence: Not on file    Family History: Family History  Problem Relation Age of Onset   High blood pressure Mother    Alzheimer's disease Father    Colon cancer Neg Hx    Esophageal cancer Neg Hx    Rectal cancer Neg Hx    Stomach cancer Neg Hx      Review of Systems: All other systems reviewed and are otherwise negative except as noted above.  Physical Exam: Vitals:   01/18/22 0906  BP: 126/88  Pulse: 82  SpO2: 96%  Weight: 203 lb 9.6 oz (92.4 kg)  Height: '6\' 1"'$  (1.854 m)     GEN- The patient is well appearing, alert and oriented x 3 today.   HEENT: normocephalic, atraumatic; sclera clear, conjunctiva pink; hearing intact; oropharynx clear; neck supple, no JVP Lymph- no cervical lymphadenopathy Lungs- Clear to ausculation bilaterally, normal work of breathing.  No wheezes, rales, rhonchi Heart- Regular  rate and rhythm, no murmurs, rubs or gallops, PMI not laterally displaced GI- soft, non-tender, non-distended, bowel sounds present, no hepatosplenomegaly Extremities- no clubbing or cyanosis. No peripheral edema; DP/PT/radial pulses 2+ bilaterally MS- no significant deformity or atrophy Skin- warm and dry, no rash or lesion; PPM pocket well healed Psych- euthymic mood, full affect Neuro- strength and sensation are intact  PPM Interrogation-  reviewed in detail today,  See PACEART report.  EKG:  EKG is not ordered today. Personal review of ekg ordered 09/28/21 shows AV dual paced rhythm at 61 bpm   Recent Labs: 09/28/2021: ALT 14; BUN 22; Creatinine, Ser 2.26; Hemoglobin 14.9; Platelets 169; Potassium 4.7; Sodium 135   Wt Readings from Last 3 Encounters:  01/18/22 203 lb 9.6 oz (92.4 kg)  09/28/21 209 lb (94.8 kg)  07/12/21 209 lb 2 oz (94.9 kg)     Other studies Reviewed: Additional studies/ records that were reviewed today include: Previous EP office notes, Previous remote checks, Most recent labwork.   Assessment and Plan:  1.  Syncope, Stokes Adam; SND   s/p Medtronic PPM  Normal PPM function See Claudia Desanctis Art report No changes today  Current medicines are reviewed at length with the patient today.    Labs/  tests ordered today include:  Orders Placed This Encounter  Procedures   CUP PACEART INCLINIC DEVICE CHECK    Disposition:   Follow up with Dr. Lovena Le in 12 months    Signed, Shirley Friar, PA-C  01/18/2022 9:11 AM  [facility] [address] [place] [address] [phone] (office) [phone] (fax)

## 2022-01-18 ENCOUNTER — Ambulatory Visit: Payer: Medicare Other | Attending: Student | Admitting: Student

## 2022-01-18 ENCOUNTER — Encounter: Payer: Self-pay | Admitting: Student

## 2022-01-18 VITALS — BP 126/88 | HR 82 | Ht 73.0 in | Wt 203.6 lb

## 2022-01-18 DIAGNOSIS — I10 Essential (primary) hypertension: Secondary | ICD-10-CM

## 2022-01-18 DIAGNOSIS — I441 Atrioventricular block, second degree: Secondary | ICD-10-CM | POA: Diagnosis not present

## 2022-01-18 DIAGNOSIS — I495 Sick sinus syndrome: Secondary | ICD-10-CM | POA: Diagnosis not present

## 2022-01-18 DIAGNOSIS — R55 Syncope and collapse: Secondary | ICD-10-CM

## 2022-01-18 LAB — CUP PACEART INCLINIC DEVICE CHECK
Battery Remaining Longevity: 100 mo
Battery Voltage: 2.99 V
Brady Statistic AP VP Percent: 0.16 %
Brady Statistic AP VS Percent: 96.41 %
Brady Statistic AS VP Percent: 0 %
Brady Statistic AS VS Percent: 3.42 %
Brady Statistic RA Percent Paced: 96.59 %
Brady Statistic RV Percent Paced: 0.17 %
Date Time Interrogation Session: 20230913095047
Implantable Lead Implant Date: 20181022
Implantable Lead Implant Date: 20181022
Implantable Lead Location: 753859
Implantable Lead Location: 753860
Implantable Lead Model: 3830
Implantable Lead Model: 5076
Implantable Pulse Generator Implant Date: 20181022
Lead Channel Impedance Value: 342 Ohm
Lead Channel Impedance Value: 380 Ohm
Lead Channel Impedance Value: 494 Ohm
Lead Channel Impedance Value: 570 Ohm
Lead Channel Pacing Threshold Amplitude: 0.75 V
Lead Channel Pacing Threshold Amplitude: 1 V
Lead Channel Pacing Threshold Pulse Width: 0.4 ms
Lead Channel Pacing Threshold Pulse Width: 0.4 ms
Lead Channel Sensing Intrinsic Amplitude: 10.25 mV
Lead Channel Sensing Intrinsic Amplitude: 12 mV
Lead Channel Sensing Intrinsic Amplitude: 3.875 mV
Lead Channel Sensing Intrinsic Amplitude: 4 mV
Lead Channel Setting Pacing Amplitude: 1.5 V
Lead Channel Setting Pacing Amplitude: 2.5 V
Lead Channel Setting Pacing Pulse Width: 1 ms
Lead Channel Setting Sensing Sensitivity: 1.2 mV

## 2022-01-18 NOTE — Patient Instructions (Signed)
Medication Instructions:  Your physician recommends that you continue on your current medications as directed. Please refer to the Current Medication list given to you today.  *If you need a refill on your cardiac medications before your next appointment, please call your pharmacy*   Lab Work: None If you have labs (blood work) drawn today and your tests are completely normal, you will receive your results only by: [place] (if you have MyChart) OR A paper copy in the mail If you have any lab test that is abnormal or we need to change your treatment, we will call you to review the results.   Follow-Up: At [facility], you and your health needs are our priority.  As part of our continuing mission to provide you with exceptional heart care, we have created designated Provider Care Teams.  These Care Teams include your primary Cardiologist (physician) and Advanced Practice Providers (APPs -  Physician Assistants and Nurse Practitioners) who all work together to provide you with the care you need, when you need it.   Your next appointment:   1 year(s)  The format for your next appointment:   In Person  Provider:   Cristopher Peru, MD

## 2022-01-20 NOTE — Progress Notes (Signed)
Remote pacemaker transmission.   

## 2022-03-27 ENCOUNTER — Ambulatory Visit (INDEPENDENT_AMBULATORY_CARE_PROVIDER_SITE_OTHER): Payer: Medicare Other

## 2022-03-27 DIAGNOSIS — R55 Syncope and collapse: Secondary | ICD-10-CM | POA: Diagnosis not present

## 2022-03-27 LAB — CUP PACEART REMOTE DEVICE CHECK
Battery Remaining Longevity: 98 mo
Battery Voltage: 2.99 V
Brady Statistic AP VP Percent: 0.11 %
Brady Statistic AP VS Percent: 93.35 %
Brady Statistic AS VP Percent: 0 %
Brady Statistic AS VS Percent: 6.54 %
Brady Statistic RA Percent Paced: 93.49 %
Brady Statistic RV Percent Paced: 0.11 %
Date Time Interrogation Session: 20231119235744
Implantable Lead Connection Status: 753985
Implantable Lead Connection Status: 753985
Implantable Lead Implant Date: 20181022
Implantable Lead Implant Date: 20181022
Implantable Lead Location: 753859
Implantable Lead Location: 753860
Implantable Lead Model: 3830
Implantable Lead Model: 5076
Implantable Pulse Generator Implant Date: 20181022
Lead Channel Impedance Value: 323 Ohm
Lead Channel Impedance Value: 361 Ohm
Lead Channel Impedance Value: 475 Ohm
Lead Channel Impedance Value: 532 Ohm
Lead Channel Pacing Threshold Amplitude: 0.625 V
Lead Channel Pacing Threshold Amplitude: 0.875 V
Lead Channel Pacing Threshold Pulse Width: 0.4 ms
Lead Channel Pacing Threshold Pulse Width: 0.4 ms
Lead Channel Sensing Intrinsic Amplitude: 3 mV
Lead Channel Sensing Intrinsic Amplitude: 3 mV
Lead Channel Sensing Intrinsic Amplitude: 9 mV
Lead Channel Sensing Intrinsic Amplitude: 9 mV
Lead Channel Setting Pacing Amplitude: 1.5 V
Lead Channel Setting Pacing Amplitude: 2.5 V
Lead Channel Setting Pacing Pulse Width: 1 ms
Lead Channel Setting Sensing Sensitivity: 1.2 mV
Zone Setting Status: 755011
Zone Setting Status: 755011

## 2022-05-09 NOTE — Progress Notes (Signed)
Remote pacemaker transmission.   

## 2022-05-26 ENCOUNTER — Encounter (HOSPITAL_COMMUNITY): Payer: Self-pay

## 2022-05-26 ENCOUNTER — Ambulatory Visit (HOSPITAL_COMMUNITY)
Admission: EM | Disposition: A | Discharge status: Home / Self Care | Payer: Self-pay | Source: Home / Self Care | Attending: Emergency Medicine

## 2022-05-26 ENCOUNTER — Observation Stay (HOSPITAL_BASED_OUTPATIENT_CLINIC_OR_DEPARTMENT_OTHER): Payer: 59

## 2022-05-26 ENCOUNTER — Emergency Department (HOSPITAL_COMMUNITY): Payer: 59

## 2022-05-26 ENCOUNTER — Other Ambulatory Visit: Payer: Self-pay

## 2022-05-26 ENCOUNTER — Observation Stay (HOSPITAL_COMMUNITY)
Admission: EM | Admit: 2022-05-26 | Discharge: 2022-05-27 | Disposition: A | Discharge status: Home / Self Care | Payer: 59 | Attending: Internal Medicine | Admitting: Internal Medicine

## 2022-05-26 DIAGNOSIS — Z79899 Other long term (current) drug therapy: Secondary | ICD-10-CM | POA: Diagnosis not present

## 2022-05-26 DIAGNOSIS — Z950 Presence of cardiac pacemaker: Secondary | ICD-10-CM | POA: Insufficient documentation

## 2022-05-26 DIAGNOSIS — R7989 Other specified abnormal findings of blood chemistry: Secondary | ICD-10-CM | POA: Diagnosis not present

## 2022-05-26 DIAGNOSIS — J4489 Other specified chronic obstructive pulmonary disease: Secondary | ICD-10-CM | POA: Diagnosis present

## 2022-05-26 DIAGNOSIS — K219 Gastro-esophageal reflux disease without esophagitis: Secondary | ICD-10-CM | POA: Diagnosis present

## 2022-05-26 DIAGNOSIS — I495 Sick sinus syndrome: Secondary | ICD-10-CM | POA: Diagnosis present

## 2022-05-26 DIAGNOSIS — R072 Precordial pain: Secondary | ICD-10-CM | POA: Diagnosis present

## 2022-05-26 DIAGNOSIS — I129 Hypertensive chronic kidney disease with stage 1 through stage 4 chronic kidney disease, or unspecified chronic kidney disease: Secondary | ICD-10-CM | POA: Diagnosis not present

## 2022-05-26 DIAGNOSIS — R079 Chest pain, unspecified: Secondary | ICD-10-CM

## 2022-05-26 DIAGNOSIS — I4892 Unspecified atrial flutter: Secondary | ICD-10-CM | POA: Insufficient documentation

## 2022-05-26 DIAGNOSIS — Z87891 Personal history of nicotine dependence: Secondary | ICD-10-CM | POA: Diagnosis not present

## 2022-05-26 DIAGNOSIS — I200 Unstable angina: Secondary | ICD-10-CM

## 2022-05-26 DIAGNOSIS — J45909 Unspecified asthma, uncomplicated: Secondary | ICD-10-CM | POA: Diagnosis not present

## 2022-05-26 DIAGNOSIS — E785 Hyperlipidemia, unspecified: Secondary | ICD-10-CM | POA: Diagnosis present

## 2022-05-26 DIAGNOSIS — I2510 Atherosclerotic heart disease of native coronary artery without angina pectoris: Secondary | ICD-10-CM | POA: Diagnosis not present

## 2022-05-26 DIAGNOSIS — I214 Non-ST elevation (NSTEMI) myocardial infarction: Principal | ICD-10-CM

## 2022-05-26 DIAGNOSIS — N184 Chronic kidney disease, stage 4 (severe): Secondary | ICD-10-CM | POA: Diagnosis not present

## 2022-05-26 DIAGNOSIS — E1122 Type 2 diabetes mellitus with diabetic chronic kidney disease: Secondary | ICD-10-CM | POA: Insufficient documentation

## 2022-05-26 DIAGNOSIS — J449 Chronic obstructive pulmonary disease, unspecified: Secondary | ICD-10-CM | POA: Diagnosis not present

## 2022-05-26 DIAGNOSIS — I160 Hypertensive urgency: Secondary | ICD-10-CM | POA: Diagnosis not present

## 2022-05-26 DIAGNOSIS — N1832 Chronic kidney disease, stage 3b: Secondary | ICD-10-CM | POA: Diagnosis present

## 2022-05-26 DIAGNOSIS — Z955 Presence of coronary angioplasty implant and graft: Secondary | ICD-10-CM

## 2022-05-26 HISTORY — DX: Type 2 diabetes mellitus without complications: E11.9

## 2022-05-26 HISTORY — PX: LEFT HEART CATH AND CORONARY ANGIOGRAPHY: CATH118249

## 2022-05-26 HISTORY — PX: INTRAVASCULAR ULTRASOUND/IVUS: CATH118244

## 2022-05-26 HISTORY — PX: CORONARY STENT INTERVENTION: CATH118234

## 2022-05-26 HISTORY — DX: Atherosclerotic heart disease of native coronary artery without angina pectoris: I25.10

## 2022-05-26 LAB — CBC
HCT: 43.2 % (ref 39.0–52.0)
Hemoglobin: 13.9 g/dL (ref 13.0–17.0)
MCH: 29 pg (ref 26.0–34.0)
MCHC: 32.2 g/dL (ref 30.0–36.0)
MCV: 90 fL (ref 80.0–100.0)
Platelets: 197 10*3/uL (ref 150–400)
RBC: 4.8 MIL/uL (ref 4.22–5.81)
RDW: 12.6 % (ref 11.5–15.5)
WBC: 8.8 10*3/uL (ref 4.0–10.5)
nRBC: 0 % (ref 0.0–0.2)

## 2022-05-26 LAB — BASIC METABOLIC PANEL
Anion gap: 9 (ref 5–15)
BUN: 24 mg/dL — ABNORMAL HIGH (ref 8–23)
CO2: 27 mmol/L (ref 22–32)
Calcium: 9.1 mg/dL (ref 8.9–10.3)
Chloride: 102 mmol/L (ref 98–111)
Creatinine, Ser: 2.07 mg/dL — ABNORMAL HIGH (ref 0.61–1.24)
GFR, Estimated: 35 mL/min — ABNORMAL LOW (ref >60)
Glucose, Bld: 105 mg/dL — ABNORMAL HIGH (ref 70–99)
Potassium: 3.8 mmol/L (ref 3.5–5.1)
Sodium: 138 mmol/L (ref 135–145)

## 2022-05-26 LAB — ECHOCARDIOGRAM COMPLETE
Area-P 1/2: 3.5 cm2
Calc EF: 41.1 %
Height: 73 in
S' Lateral: 3.2 cm
Single Plane A2C EF: 40.4 %
Single Plane A4C EF: 38.9 %
Weight: 3200 oz

## 2022-05-26 LAB — HEPARIN LEVEL (UNFRACTIONATED): Heparin Unfractionated: 0.21 IU/mL — ABNORMAL LOW (ref 0.30–0.70)

## 2022-05-26 LAB — PROTIME-INR
INR: 1.1 (ref 0.8–1.2)
Prothrombin Time: 13.6 seconds (ref 11.4–15.2)

## 2022-05-26 LAB — BRAIN NATRIURETIC PEPTIDE: B Natriuretic Peptide: 62.9 pg/mL (ref 0.0–100.0)

## 2022-05-26 LAB — HIV ANTIBODY (ROUTINE TESTING W REFLEX): HIV Screen 4th Generation wRfx: NONREACTIVE

## 2022-05-26 LAB — TROPONIN I (HIGH SENSITIVITY)
Troponin I (High Sensitivity): 16 ng/L (ref <18)
Troponin I (High Sensitivity): 71 ng/L — ABNORMAL HIGH (ref <18)
Troponin I (High Sensitivity): 258 ng/L (ref <18)

## 2022-05-26 LAB — POCT ACTIVATED CLOTTING TIME
Activated Clotting Time: 293 seconds
Activated Clotting Time: 298 seconds

## 2022-05-26 SURGERY — LEFT HEART CATH AND CORONARY ANGIOGRAPHY
Anesthesia: LOCAL

## 2022-05-26 MED ORDER — HYDRALAZINE HCL 20 MG/ML IJ SOLN: 10.0000 mg | INTRAMUSCULAR | Status: AC | PRN

## 2022-05-26 MED ORDER — SODIUM CHLORIDE 0.9% FLUSH: 3.0000 mL | INTRAVENOUS | Status: DC | PRN

## 2022-05-26 MED ORDER — ASPIRIN 81 MG PO CHEW: 81.0000 mg | CHEWABLE_TABLET | ORAL | Status: DC

## 2022-05-26 MED ORDER — DOCUSATE SODIUM 100 MG PO CAPS
100.0000 mg | ORAL_CAPSULE | Freq: Two times a day (BID) | ORAL | Status: DC
  Administered 2022-05-26 – 2022-05-27 (×2): 100 mg via ORAL
  Filled 2022-05-26 (×2): qty 1

## 2022-05-26 MED ORDER — METOPROLOL TARTRATE 100 MG PO TABS
100.0000 mg | ORAL_TABLET | Freq: Two times a day (BID) | ORAL | Status: DC
  Administered 2022-05-26 (×2): 100 mg via ORAL
  Filled 2022-05-26: qty 1
  Filled 2022-05-26: qty 4
  Filled 2022-05-26: qty 1

## 2022-05-26 MED ORDER — HYDRALAZINE HCL 50 MG PO TABS
50.0000 mg | ORAL_TABLET | Freq: Three times a day (TID) | ORAL | Status: DC
  Administered 2022-05-26 – 2022-05-27 (×4): 50 mg via ORAL
  Filled 2022-05-26 (×2): qty 1
  Filled 2022-05-26: qty 2
  Filled 2022-05-26: qty 1

## 2022-05-26 MED ORDER — ACETAMINOPHEN 325 MG PO TABS: 650.0000 mg | ORAL_TABLET | Freq: Four times a day (QID) | ORAL | Status: DC | PRN

## 2022-05-26 MED ORDER — VERAPAMIL HCL 2.5 MG/ML IV SOLN
INTRAVENOUS | Status: DC | PRN
  Administered 2022-05-26 (×2): 10 mL via INTRA_ARTERIAL

## 2022-05-26 MED ORDER — UMECLIDINIUM BROMIDE 62.5 MCG/ACT IN AEPB
1.0000 | INHALATION_SPRAY | Freq: Every day | RESPIRATORY_TRACT | Status: DC
  Administered 2022-05-27: 1 via RESPIRATORY_TRACT
  Filled 2022-05-26: qty 7

## 2022-05-26 MED ORDER — LIDOCAINE HCL (PF) 1 % IJ SOLN
INTRAMUSCULAR | Status: DC | PRN
  Administered 2022-05-26: 2 mL

## 2022-05-26 MED ORDER — LIDOCAINE HCL (PF) 1 % IJ SOLN
INTRAMUSCULAR | Status: AC
  Filled 2022-05-26: qty 30

## 2022-05-26 MED ORDER — ALBUTEROL SULFATE (2.5 MG/3ML) 0.083% IN NEBU: 2.5000 mg | INHALATION_SOLUTION | Freq: Four times a day (QID) | RESPIRATORY_TRACT | Status: DC | PRN

## 2022-05-26 MED ORDER — ISOSORBIDE DINITRATE 30 MG PO TABS
30.0000 mg | ORAL_TABLET | Freq: Three times a day (TID) | ORAL | Status: DC
  Administered 2022-05-26 – 2022-05-27 (×2): 30 mg via ORAL
  Filled 2022-05-26 (×2): qty 1

## 2022-05-26 MED ORDER — PALIPERIDONE ER 6 MG PO TB24
6.0000 mg | ORAL_TABLET | Freq: Every day | ORAL | Status: DC
  Administered 2022-05-26: 6 mg via ORAL
  Filled 2022-05-26: qty 1

## 2022-05-26 MED ORDER — SUCRALFATE 1 GM/10ML PO SUSP: 1.0000 g | Freq: Every day | ORAL | Status: DC | PRN

## 2022-05-26 MED ORDER — SODIUM CHLORIDE 0.9 % WEIGHT BASED INFUSION
3.0000 mL/kg/h | INTRAVENOUS | Status: DC
  Administered 2022-05-26: 3 mL/kg/h via INTRAVENOUS

## 2022-05-26 MED ORDER — NITROGLYCERIN 1 MG/10 ML FOR IR/CATH LAB
INTRA_ARTERIAL | Status: AC
  Filled 2022-05-26: qty 10

## 2022-05-26 MED ORDER — ASPIRIN 81 MG PO CHEW
81.0000 mg | CHEWABLE_TABLET | Freq: Every day | ORAL | Status: DC
  Administered 2022-05-27: 81 mg via ORAL
  Filled 2022-05-26: qty 1

## 2022-05-26 MED ORDER — ACETAMINOPHEN 325 MG PO TABS: 650.0000 mg | ORAL_TABLET | ORAL | Status: DC | PRN

## 2022-05-26 MED ORDER — HEPARIN BOLUS VIA INFUSION
3000.0000 [IU] | Freq: Once | INTRAVENOUS | Status: AC
  Administered 2022-05-26: 3000 [IU] via INTRAVENOUS
  Filled 2022-05-26: qty 3000

## 2022-05-26 MED ORDER — SODIUM CHLORIDE 0.9 % WEIGHT BASED INFUSION
1.0000 mL/kg/h | INTRAVENOUS | Status: DC
  Administered 2022-05-26: 1 mL/kg/h via INTRAVENOUS

## 2022-05-26 MED ORDER — SODIUM CHLORIDE 0.9 % IV SOLN: INTRAVENOUS | Status: AC

## 2022-05-26 MED ORDER — FENTANYL CITRATE (PF) 100 MCG/2ML IJ SOLN
INTRAMUSCULAR | Status: AC
  Filled 2022-05-26: qty 2

## 2022-05-26 MED ORDER — SODIUM CHLORIDE 0.9 % IV SOLN: 250.0000 mL | INTRAVENOUS | Status: DC | PRN

## 2022-05-26 MED ORDER — FENTANYL CITRATE (PF) 100 MCG/2ML IJ SOLN
INTRAMUSCULAR | Status: DC | PRN
  Administered 2022-05-26 (×2): 25 ug via INTRAVENOUS

## 2022-05-26 MED ORDER — SODIUM CHLORIDE 0.9% FLUSH: 3.0000 mL | Freq: Two times a day (BID) | INTRAVENOUS | Status: DC

## 2022-05-26 MED ORDER — HEPARIN SODIUM (PORCINE) 1000 UNIT/ML IJ SOLN
INTRAMUSCULAR | Status: AC
  Filled 2022-05-26: qty 10

## 2022-05-26 MED ORDER — HEPARIN SODIUM (PORCINE) 1000 UNIT/ML IJ SOLN
INTRAMUSCULAR | Status: DC | PRN
  Administered 2022-05-26: 7000 [IU] via INTRAVENOUS
  Administered 2022-05-26: 2000 [IU] via INTRAVENOUS
  Administered 2022-05-26: 4500 [IU] via INTRAVENOUS

## 2022-05-26 MED ORDER — CLONIDINE HCL 0.2 MG PO TABS
0.2000 mg | ORAL_TABLET | Freq: Three times a day (TID) | ORAL | Status: DC
  Administered 2022-05-26 – 2022-05-27 (×4): 0.2 mg via ORAL
  Filled 2022-05-26 (×4): qty 1

## 2022-05-26 MED ORDER — ACETAMINOPHEN 650 MG RE SUPP: 650.0000 mg | Freq: Four times a day (QID) | RECTAL | Status: DC | PRN

## 2022-05-26 MED ORDER — ISOSORBIDE DINITRATE 30 MG PO TABS
30.0000 mg | ORAL_TABLET | Freq: Four times a day (QID) | ORAL | Status: DC
  Administered 2022-05-26 (×2): 30 mg via ORAL
  Filled 2022-05-26 (×2): qty 1
  Filled 2022-05-26 (×2): qty 3

## 2022-05-26 MED ORDER — MIDAZOLAM HCL 2 MG/2ML IJ SOLN
INTRAMUSCULAR | Status: DC | PRN
  Administered 2022-05-26: 2 mg via INTRAVENOUS

## 2022-05-26 MED ORDER — HYDRALAZINE HCL 20 MG/ML IJ SOLN: 5.0000 mg | Freq: Four times a day (QID) | INTRAMUSCULAR | Status: DC | PRN

## 2022-05-26 MED ORDER — TICAGRELOR 90 MG PO TABS
90.0000 mg | ORAL_TABLET | Freq: Two times a day (BID) | ORAL | Status: DC
  Administered 2022-05-26 – 2022-05-27 (×2): 90 mg via ORAL
  Filled 2022-05-26 (×2): qty 1

## 2022-05-26 MED ORDER — MIDAZOLAM HCL 2 MG/2ML IJ SOLN
INTRAMUSCULAR | Status: AC
  Filled 2022-05-26: qty 2

## 2022-05-26 MED ORDER — LORATADINE 10 MG PO TABS
10.0000 mg | ORAL_TABLET | Freq: Every day | ORAL | Status: DC
  Administered 2022-05-26 – 2022-05-27 (×2): 10 mg via ORAL
  Filled 2022-05-26 (×2): qty 1

## 2022-05-26 MED ORDER — TICAGRELOR 90 MG PO TABS
ORAL_TABLET | ORAL | Status: DC | PRN
  Administered 2022-05-26: 180 mg via ORAL

## 2022-05-26 MED ORDER — HEPARIN (PORCINE) 25000 UT/250ML-% IV SOLN
1250.0000 [IU]/h | INTRAVENOUS | Status: DC
  Administered 2022-05-26 (×2): 1250 [IU]/h via INTRAVENOUS
  Filled 2022-05-26: qty 250

## 2022-05-26 MED ORDER — VERAPAMIL HCL 2.5 MG/ML IV SOLN
INTRAVENOUS | Status: AC
  Filled 2022-05-26: qty 2

## 2022-05-26 MED ORDER — ASPIRIN 81 MG PO CHEW
81.0000 mg | CHEWABLE_TABLET | ORAL | Status: AC
  Administered 2022-05-26: 81 mg via ORAL
  Filled 2022-05-26: qty 1

## 2022-05-26 MED ORDER — LABETALOL HCL 5 MG/ML IV SOLN: 10.0000 mg | INTRAVENOUS | Status: AC | PRN

## 2022-05-26 MED ORDER — MOMETASONE FURO-FORMOTEROL FUM 100-5 MCG/ACT IN AERO
2.0000 | INHALATION_SPRAY | Freq: Two times a day (BID) | RESPIRATORY_TRACT | Status: DC
  Administered 2022-05-26 – 2022-05-27 (×2): 2 via RESPIRATORY_TRACT
  Filled 2022-05-26: qty 8.8

## 2022-05-26 MED ORDER — ONDANSETRON HCL 4 MG/2ML IJ SOLN: 4.0000 mg | Freq: Four times a day (QID) | INTRAMUSCULAR | Status: DC | PRN

## 2022-05-26 MED ORDER — HEPARIN (PORCINE) IN NACL 1000-0.9 UT/500ML-% IV SOLN
INTRAVENOUS | Status: DC | PRN
  Administered 2022-05-26 (×2): 500 mL

## 2022-05-26 MED ORDER — ALBUTEROL SULFATE HFA 108 (90 BASE) MCG/ACT IN AERS: 2.0000 | INHALATION_SPRAY | Freq: Four times a day (QID) | RESPIRATORY_TRACT | Status: DC | PRN

## 2022-05-26 MED ORDER — HEPARIN (PORCINE) IN NACL 1000-0.9 UT/500ML-% IV SOLN
INTRAVENOUS | Status: AC
  Filled 2022-05-26: qty 1000

## 2022-05-26 MED ORDER — BUPROPION HCL ER (SR) 150 MG PO TB12
150.0000 mg | ORAL_TABLET | Freq: Two times a day (BID) | ORAL | Status: DC
  Administered 2022-05-26 – 2022-05-27 (×3): 150 mg via ORAL
  Filled 2022-05-26 (×3): qty 1

## 2022-05-26 MED ORDER — PANTOPRAZOLE SODIUM 40 MG PO TBEC
40.0000 mg | DELAYED_RELEASE_TABLET | Freq: Two times a day (BID) | ORAL | Status: DC
  Administered 2022-05-26 – 2022-05-27 (×3): 40 mg via ORAL
  Filled 2022-05-26 (×3): qty 1

## 2022-05-26 MED ORDER — FLUTICASONE PROPIONATE 50 MCG/ACT NA SUSP: 1.0000 | Freq: Every day | NASAL | Status: DC | PRN

## 2022-05-26 MED ORDER — ONDANSETRON 4 MG PO TBDP: 4.0000 mg | ORAL_TABLET | Freq: Three times a day (TID) | ORAL | Status: DC | PRN

## 2022-05-26 MED ORDER — MONTELUKAST SODIUM 10 MG PO TABS
10.0000 mg | ORAL_TABLET | Freq: Every day | ORAL | Status: DC
  Administered 2022-05-26: 10 mg via ORAL
  Filled 2022-05-26: qty 1

## 2022-05-26 MED ORDER — BUDESON-GLYCOPYRROL-FORMOTEROL 160-9-4.8 MCG/ACT IN AERO: 2.0000 | INHALATION_SPRAY | Freq: Two times a day (BID) | RESPIRATORY_TRACT | Status: DC

## 2022-05-26 MED ORDER — IOHEXOL 350 MG/ML SOLN
INTRAVENOUS | Status: DC | PRN
  Administered 2022-05-26: 50 mL

## 2022-05-26 MED ORDER — PERFLUTREN LIPID MICROSPHERE
1.0000 mL | INTRAVENOUS | Status: DC | PRN
  Administered 2022-05-26: 5 mL via INTRAVENOUS

## 2022-05-26 SURGICAL SUPPLY — 25 items
BALL SAPPHIRE NC24 3.75X12 (BALLOONS) ×1
BALLN EMERGE MR 2.0X15 (BALLOONS) ×1
BALLN ~~LOC~~ EMERGE MR 4.0X8 (BALLOONS) ×1
BALLOON EMERGE MR 2.0X15 (BALLOONS) IMPLANT
BALLOON SAPPHIRE NC24 3.75X12 (BALLOONS) IMPLANT
BALLOON ~~LOC~~ EMERGE MR 4.0X8 (BALLOONS) IMPLANT
CATH 5FR JL3.5 JR4 ANG PIG MP (CATHETERS) IMPLANT
CATH LAUNCHER 6FR EBU 3.5 SH (CATHETERS) IMPLANT
CATH OPTICROSS HD (CATHETERS) IMPLANT
DEVICE RAD COMP TR BAND LRG (VASCULAR PRODUCTS) IMPLANT
GLIDESHEATH SLEND SS 6F .021 (SHEATH) IMPLANT
GUIDEWIRE INQWIRE 1.5J.035X260 (WIRE) IMPLANT
INQWIRE 1.5J .035X260CM (WIRE) ×1
KIT ENCORE 26 ADVANTAGE (KITS) IMPLANT
KIT HEART LEFT (KITS) ×2 IMPLANT
PACK CARDIAC CATHETERIZATION (CUSTOM PROCEDURE TRAY) ×2 IMPLANT
SHEATH PROBE COVER 6X72 (BAG) IMPLANT
SLED PULL BACK IVUS (MISCELLANEOUS) IMPLANT
STENT SYNERGY XD 3.50X24 (Permanent Stent) IMPLANT
SYNERGY XD 3.50X24 (Permanent Stent) ×1 IMPLANT
SYR MEDRAD MARK 7 150ML (SYRINGE) ×2 IMPLANT
TRANSDUCER W/STOPCOCK (MISCELLANEOUS) ×2 IMPLANT
TUBING CIL FLEX 10 FLL-RA (TUBING) ×2 IMPLANT
VALVE GUARDIAN II ~~LOC~~ HEMO (MISCELLANEOUS) IMPLANT
WIRE RUNTHROUGH .014X180CM (WIRE) IMPLANT

## 2022-05-26 NOTE — H&P (Signed)
History and Physical    Peter Marsh ZJQ:[id number] DOB: 1961/05/04 DOA: 05/26/2022  PCP: Bernerd Limbo, MD  Patient coming from: Home  Chief Complaint: Chest pain  HPI: Peter Marsh is a 62 y.o. 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  He presents to the ED via EMS with a chief complaint of chest pain and was given aspirin 324 mg and nitroglycerin x 1 and route.  He was chest pain-free at the time of arrival to the ED.  Blood pressure elevated with systolic in the 240X to 735H.  Afebrile. Not tachycardic, tachypneic, or hypoxic.  Labs showing no leukocytosis or anemia, creatinine 2.0 (at baseline), troponin 16> 71, BNP normal.  Chest x-ray showing cardiomegaly with mildly distended pulmonary vasculature.  No consolidation, effusion, or pneumothorax. ED physician discussed the case with on-call cardiologist who recommended starting IV heparin due to concern for possible NSTEMI.  Cardiology team will consult.  [facility] called to admit.  Patient states yesterday around 1 PM he was taking out his garbage when all of a sudden he experienced substernal chest tightness and shortness of breath.  States "it felt like my lungs were on fire."  States later at night while in bed he had recurrence of these symptoms and this time "10 times worse" which prompted him to seek medical attention.  Chest pain resolved after he received nitroglycerin with EMS.  He reports history of GERD in the past but believes it is currently well-controlled.  Denies history of blood clots.  States he was previously seen by Dr. Einar Gip and had several cardiac tests done but is not sure if he ever had cardiac  catheterization done.  Patient states his blood pressure is always high despite taking multiple medications although he is not entirely compliant as some of these medications make him feel sleepy and he is not taking them as prescribed.  Review of Systems:  Review of Systems  All other systems reviewed and are negative.   Past Medical History:  Diagnosis Date   Allergy    Anxiety    Asthma    uses inhalers    Bilateral carotid bruits    Cardiac conduction disorder 2018   s/p MDT PPM   Cataract    Chronic kidney disease    bladder interstial cystitis    Chronic kidney disease (CKD), stage IV (severe) ([name])    followed by Dr. Joelyn Oms at [facility]   COPD (chronic obstructive pulmonary disease) ([place])    Coronary artery disease    Depression    Diabetes mellitus without complication ([place])    Encounter for care of pacemaker 02/13/2019   GERD (gastroesophageal reflux disease)    History of stomach ulcers 2001   Hypertension    LBBB (left bundle branch block)    Lower extremity edema    Mild intermittent asthma without complication    Mixed hyperlipidemia    Mobitz type 2 second degree AV block 04/06/2019   Neuropathy    Pacemaker: Medtronic Azure XT DR MRI [id number] PPM -  BUNDLE OF HIS pacing  02/26/2017   Scheduled Remote pacemaker check  11/12/2018:  There were 24 Fast AV episodes:  EGMs show SVTs. Episodes lasted < 2 minutes. Health trends do not demonstrate significant abnormality. Battery longevity  is 9.4 - 10.3 years. RA pacing is 47.1 %, RV pacing is 40.4 %.  Clinic check 11/07/17.    Paroxysmal atrial flutter ([name])    PONV (postoperative nausea and vomiting)    Prostatitis    Recurrent upper respiratory infection (URI)    Schizophrenia ([name])    Sinus node dysfunction ([place]) 02/13/2019   Urticaria     Past Surgical History:  Procedure Laterality Date   ADENOIDECTOMY     APPENDECTOMY     COLONOSCOPY     ELECTROPHYSIOLOGY STUDY N/A 02/26/2017   Procedure:  ELECTROPHYSIOLOGY STUDY;  Surgeon: Evans Lance, MD;  Location: [facility] [facility];  Service: Cardiovascular;  Laterality: N/A;   ESOPHAGOGASTRODUODENOSCOPY (EGD) WITH PROPOFOL N/A 09/18/2016   Procedure: ESOPHAGOGASTRODUODENOSCOPY (EGD) WITH PROPOFOL;  Surgeon: Ronnette Juniper, MD;  Location: [facility];  Service: Gastroenterology;  Laterality: N/A;   LUMBAR LAMINECTOMY/DECOMPRESSION MICRODISCECTOMY  04/19/2011   Procedure: LUMBAR LAMINECTOMY/DECOMPRESSION MICRODISCECTOMY;  Surgeon: Tobi Bastos;  Location: [facility];  Service: Orthopedics;  Laterality: Left;  Hemi LAminectomy/Microdiscectomy Lumbar four  - Lumbar five  on the Left (X-Ray)   PACEMAKER IMPLANT N/A 02/26/2017   Procedure: PACEMAKER IMPLANT;  Surgeon: Evans Lance, MD;  Location: [facility];  Service: Cardiovascular;  Laterality: N/A;   TONSILLECTOMY     UPPER GASTROINTESTINAL ENDOSCOPY       reports that he quit smoking about 14 years ago. His smoking use included cigarettes. He has a 30.00 pack-year smoking history. He has never used smokeless tobacco. He reports that he does not drink alcohol and does not use drugs.  Allergies  Allergen Reactions   Methylpyrrolidone Hives    froze the intestine   Niacin Itching, Nausea And Vomiting and Other (See Comments)    Flushing, itching, tingling    Ace Inhibitors     Other reaction(s): CKD 4   Norvasc [Amlodipine Besylate] Other (See Comments)    Swollen Feet   Other Other (See Comments)   Oxybutynin Chloride [Oxybutynin Chloride Er] Other (See Comments)    froze the intestine   Vesicare [Solifenacin Succinate] Other (See Comments)    Froze the intestine    Ciprofloxacin Rash and Other (See Comments)    Felt flushed    Oxybutynin Rash and Other (See Comments)    bowel obst   Solifenacin Rash    Family History  Problem Relation Age of Onset   High blood pressure Mother    Alzheimer's disease Father    Colon cancer Neg Hx    Esophageal cancer Neg Hx     Rectal cancer Neg Hx    Stomach cancer Neg Hx     Prior to Admission medications   Medication Sig Start Date End Date Taking? Authorizing Provider  acetaminophen (TYLENOL) 325 MG tablet Take 650 mg by mouth every 6 (six) hours as needed for mild pain or headache.   Yes [provider]  albuterol (VENTOLIN HFA) 108 (90 Base) MCG/ACT inhaler INHALE 2 PUFFS INTO THE LUNGS EVERY 6 HOURS AS NEEDED FOR WHEEZING OR SHORTNESS OF BREATH Patient taking differently: Inhale 2 puffs into the lungs every 6 (six) hours as needed for wheezing or shortness of breath. 07/11/21  Yes Althea Charon, FNP  Budeson-Glycopyrrol-Formoterol (BREZTRI AEROSPHERE) 160-9-4.8 MCG/ACT AERO Inhale 2 puffs into the lungs in the morning and at bedtime. 12/09/20  Yes Ambs, Kathrine Cords, FNP  buPROPion [facility] SR) 150 MG 12 hr tablet Take 150 mg by mouth 2 (two) times daily.   Yes  [provider]  cetirizine (ZYRTEC) 10 MG tablet Take 1 tablet (10 mg total) by mouth daily. 12/09/20  Yes Ambs, Kathrine Cords, FNP  cloNIDine (CATAPRES) 0.2 MG tablet Take 1 tablet (0.2 mg total) by mouth 3 (three) times daily. 01/05/21  Yes Evans Lance, MD  docusate sodium (COLACE) 100 MG capsule Take 1 capsule (100 mg total) by mouth 2 (two) times daily. 10/25/20  Yes Thurnell Lose, MD  fluticasone (FLONASE) 50 MCG/ACT nasal spray Place 1 spray into both nostrils daily as needed for allergies. 09/08/20  Yes Ambs, Kathrine Cords, FNP  gabapentin (NEURONTIN) 100 MG capsule Take 1 capsule (100 mg total) by mouth 3 (three) times daily. Patient taking differently: Take 100 mg by mouth daily as needed (for neuropathy in hands in feet). 09/08/19  Yes Marcial Pacas, MD  hydrALAZINE (APRESOLINE) 50 MG tablet Take 1 tablet (50 mg total) by mouth 3 (three) times daily. 04/15/20  Yes O'Neal, Cassie Freer, MD  isosorbide dinitrate (ISORDIL) 30 MG tablet TAKE 1 TABLET(30 MG) BY MOUTH FOUR TIMES DAILY Patient taking differently: Take 30 mg by mouth in the morning, at noon,  in the evening, and at bedtime. TAKE 1 TABLET(30 MG) BY MOUTH FOUR TIMES DAILY 03/21/21  Yes O'Neal, Cassie Freer, MD  metoprolol tartrate (LOPRESSOR) 100 MG tablet Take 1 tablet (100 mg total) by mouth 2 (two) times daily. 04/15/20  Yes O'Neal, Cassie Freer, MD  montelukast (SINGULAIR) 10 MG tablet Take 1 tablet (10 mg total) by mouth at bedtime. 12/09/20 05/27/23 Yes Ambs, Kathrine Cords, FNP  ondansetron (ZOFRAN ODT) 4 MG disintegrating tablet Take 1 tablet (4 mg total) by mouth every 8 (eight) hours as needed for nausea or vomiting. 12/17/20  Yes Thornton Park, MD  paliperidone (INVEGA) 6 MG 24 hr tablet Take 6 mg by mouth at bedtime.   Yes [provider]  pantoprazole (PROTONIX) 40 MG tablet TAKE 1 TABLET(40 MG) BY MOUTH TWICE DAILY Patient taking differently: Take 40 mg by mouth 2 (two) times daily. 07/06/21  Yes Thornton Park, MD  sucralfate (CARAFATE) 1 GM/10ML suspension Take 10 mLs (1 g total) by mouth 4 (four) times daily -  with meals and at bedtime. Patient taking differently: Take 1 g by mouth daily as needed (for ulcers). 10/25/20  Yes Thurnell Lose, MD  Pancrelipase, Lip-Prot-Amyl, (ZENPEP) 40000-126000 units CPEP Take 1 tablet by mouth with breakfast, with lunch, and with evening meal. Patient not taking: Reported on 05/26/2022 04/12/20   Thornton Park, MD  Spacer/Aero-Holding [facility] Use as directed with inhaler. 07/12/21   Althea Charon, FNP    Physical Exam: Vitals:   05/26/22 0512 05/26/22 0545 05/26/22 0600 05/26/22 0615  BP:  (!) 179/108 (!) 187/107 (!) 183/104  Pulse:  63 62 60  Resp:  '15 16 15  '$ Temp: (!) 97.2 F (36.2 C)     TempSrc: Oral     SpO2:  99% 100% 99%  Weight:      Height:        Physical Exam Vitals reviewed.  Constitutional:      General: He is not in acute distress. HENT:     Head: Normocephalic and atraumatic.  Eyes:     Extraocular Movements: Extraocular movements intact.  Cardiovascular:     Rate and Rhythm: Normal rate  and regular rhythm.     Pulses: Normal pulses.  Pulmonary:     Effort: Pulmonary effort is normal. No respiratory distress.     Breath sounds: Normal  breath sounds. No wheezing or rales.  Abdominal:     General: Bowel sounds are normal. There is no distension.     Palpations: Abdomen is soft.     Tenderness: There is no abdominal tenderness.  Musculoskeletal:     Cervical back: Normal range of motion.     Right lower leg: No edema.     Left lower leg: No edema.  Skin:    General: Skin is warm and dry.  Neurological:     General: No focal deficit present.     Mental Status: He is alert and oriented to person, place, and time.     Labs on Admission: I have personally reviewed following labs and imaging studies  CBC: Recent Labs  Lab 05/26/22 0114  WBC 8.8  HGB 13.9  HCT 43.2  MCV 90.0  PLT 295   Basic Metabolic Panel: Recent Labs  Lab 05/26/22 0114  NA 138  K 3.8  CL 102  CO2 27  GLUCOSE 105*  BUN 24*  CREATININE 2.07*  CALCIUM 9.1   GFR: Estimated Creatinine Clearance: 41.3 mL/min (A) (by C-G formula based on SCr of 2.07 mg/dL (H)). Liver Function Tests: No results for input(s): "AST", "ALT", "ALKPHOS", "BILITOT", "PROT", "ALBUMIN" in the last 168 hours. No results for input(s): "LIPASE", "AMYLASE" in the last 168 hours. No results for input(s): "AMMONIA" in the last 168 hours. Coagulation Profile: Recent Labs  Lab 05/26/22 0114  INR 1.1   Cardiac Enzymes: No results for input(s): "CKTOTAL", "CKMB", "CKMBINDEX", "TROPONINI" in the last 168 hours. BNP (last 3 results) No results for input(s): "PROBNP" in the last 8760 hours. HbA1C: No results for input(s): "HGBA1C" in the last 72 hours. CBG: No results for input(s): "GLUCAP" in the last 168 hours. Lipid Profile: No results for input(s): "CHOL", "HDL", "LDLCALC", "TRIG", "CHOLHDL", "LDLDIRECT" in the last 72 hours. Thyroid Function Tests: No results for input(s): "TSH", "T4TOTAL", "FREET4", "T3FREE",  "THYROIDAB" in the last 72 hours. Anemia Panel: No results for input(s): "VITAMINB12", "FOLATE", "FERRITIN", "TIBC", "IRON", "RETICCTPCT" in the last 72 hours. Urine analysis:    Component Value Date/Time   COLORURINE YELLOW 07/11/2020 0830   APPEARANCEUR CLEAR 07/11/2020 0830   LABSPEC 1.023 07/11/2020 0830   PHURINE 6.0 07/11/2020 0830   GLUCOSEU NEGATIVE 07/11/2020 0830   HGBUR NEGATIVE 07/11/2020 0830   BILIRUBINUR neg 09/20/2020 1024   KETONESUR NEGATIVE 07/11/2020 0830   PROTEINUR Positive (A) 09/20/2020 1024   PROTEINUR 100 (A) 07/11/2020 0830   UROBILINOGEN 0.2 09/20/2020 1024   UROBILINOGEN 0.2 04/17/2011 1030   NITRITE neg 09/20/2020 1024   NITRITE NEGATIVE 07/11/2020 0830   LEUKOCYTESUR Negative 09/20/2020 1024   LEUKOCYTESUR NEGATIVE 07/11/2020 0830    Radiological Exams on Admission: DG Chest Port 1 View  Result Date: 05/26/2022 CLINICAL DATA:  Chest pain. EXAM: PORTABLE CHEST 1 VIEW COMPARISON:  09/28/2021. FINDINGS: Heart is enlarged and the mediastinal contour is stable. There is atherosclerotic calcification of the aorta. The pulmonary vasculature is prominent. Lung volumes are low. No consolidation, effusion, or pneumothorax. Dual lead pacemaker device is present over the left chest. No acute osseous abnormality. IMPRESSION: Cardiomegaly with mildly distended pulmonary vasculature. Electronically Signed   By: Brett Fairy M.D.   On: 05/26/2022 01:46    EKG: Independently reviewed.  Paced rhythm, LBBB is not new.  Assessment and Plan  Chest pain and elevated troponin Possible NSTEMI He has documented history of CAD but no prior cath results in the chart.  EKG interpretation limited secondary  to paced rhythm, LBBB is not new.  Initial troponin 16, repeat elevated at 71.  Chest pain resolved after he received nitroglycerin with EMS.  PE less likely given no tachycardia or hypoxia.  Blood pressure significantly elevated with systolic 021.  ED physician discussed the  case with on-call cardiologist who recommended starting IV heparin due to concern for possible NSTEMI.  Cardiology team will consult. -Cardiac monitoring -Continue IV heparin -Patient received full dose aspirin by EMS -Echocardiogram  Hypertensive urgency Due to noncompliance as per pharmacy review of his medication refill records.  Blood pressure currently elevated with systolic 115 but not having chest pain at this time. -Continue home medications including clonidine, hydralazine, isosorbide, and metoprolol. 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 -Cardiac monitoring -Continue metoprolol  CKD stage IIIb Creatinine 2.0, at baseline.  Asthma-COPD overlap syndrome Stable, no signs of acute exacerbation. -Continue home meds  Anxiety/depression/schizophrenia -Continue home medications  GERD -Continue Protonix  DVT prophylaxis: IV heparin gtt Code Status: Full Code (discussed with the patient) Family Communication: No family available at this time. Consults called: Cardiology Level of care: Progressive Care Unit Admission status: It is my clinical opinion that referral for OBSERVATION is reasonable and necessary in this patient based on the above information provided. The aforementioned taken together are felt to place the patient at high risk for further clinical deterioration. However, it is anticipated that the patient may be medically stable for discharge from the hospital within 24 to 48 hours.   Shela Leff MD [facility]  If 7PM-7AM, please contact night-coverage [email or web address]  05/26/2022, 6:24 AM

## 2022-05-26 NOTE — Progress Notes (Signed)
[facility] PROGRESS NOTE   Peter Marsh XNA:[id number] DOB: 06-Jan-1961 DOA: 05/26/2022  PCP: Bernerd Limbo, MD  Brief History/Interval Summary: 62 y.o. 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  Presented with chest pain.  Concerning for unstable angina.  Hospitalized for further management.   Consultants: Cardiology  Procedures: Cardiac catheterization is planned for later today    Subjective/Interval History: Patient mentions that his chest symptoms have improved.  Denies any shortness of breath this morning.  No headaches.  No nausea or vomiting currently.    Assessment/Plan:  Unstable angina Cardiology has been consulted.  Tentative plan is for cardiac catheterization later today.  Continue with IV heparin.  Echocardiogram has been ordered.  Patient is on aspirin.  Accelerated hypertension Blood pressure is very poorly controlled.  Patient currently on clonidine, hydralazine, isosorbide dinitrate, metoprolol.  Used as needed agents as well. There is report of noncompliance.  History of sinus node dysfunction/Stokes-Adams syncope Pacemaker in situ  History of paroxysmal atrial flutter Continue metoprolol.  Not on anticoagulation due to history of GI bleed.  Chronic kidney disease stage IIIb Baseline creatinine is around 2.  Seems to be at baseline.  History of asthma/COPD Stable.  History of anxiety depression and schizophrenia Continue home medications.  He is noted to be on Wellbutrin, Invega.  GERD PPI  DVT Prophylaxis: On IV heparin Code Status: Full code Family Communication: Discussed with the patient Disposition Plan:  Hopefully return home when improved  Status is: Observation The patient remains OBS appropriate and may d/c before 2 midnights.      Medications: Scheduled:  [START ON 05/27/2022] aspirin  81 mg Oral Pre-Cath   Budeson-Glycopyrrol-Formoterol  2 puff Inhalation BID   buPROPion  150 mg Oral BID   cloNIDine  0.2 mg Oral TID   hydrALAZINE  50 mg Oral TID   isosorbide dinitrate  30 mg Oral QID   loratadine  10 mg Oral Daily   metoprolol tartrate  100 mg Oral BID   montelukast  10 mg Oral QHS   paliperidone  6 mg Oral QHS   pantoprazole  40 mg Oral BID   sodium chloride flush  3 mL Intravenous Q12H   Continuous:  sodium chloride     Followed by   sodium chloride     heparin 1,250 Units/hr (05/26/22 0537)   RKY:[id number] **OR** acetaminophen, albuterol, hydrALAZINE  Antibiotics: Anti-infectives (From admission, onward)    None       Objective:  Vital Signs  Vitals:   05/26/22 0645 05/26/22 0800 05/26/22 0830 05/26/22 0851  BP:  (!) 180/106 (!) 191/98   Pulse: 68 (!) 58 63   Resp: '14 15 16   '$ Temp:    98.1 F (36.7 C)  TempSrc:    Oral  SpO2: 98% 98% 100%   Weight:      Height:       No intake or output data in the 24 hours ending 05/26/22 0940 Filed Weights   05/26/22 0107  Weight: 90.7 kg    General appearance: Awake alert.  In no distress Resp: Clear to auscultation bilaterally.  Normal effort Cardio: S1-S2 is normal regular.  No S3-S4.  No rubs murmurs or bruit GI: Abdomen is soft.  Nontender nondistended.  Bowel sounds  are present normal.  No masses organomegaly Extremities: No edema.  Full range of motion of lower extremities. Neurologic: Alert and oriented x3.  No focal neurological deficits.    Lab Results:  Data Reviewed: I have personally reviewed following labs and reports of the imaging studies  CBC: Recent Labs  Lab 05/26/22 0114  WBC 8.8  HGB 13.9  HCT 43.2  MCV 90.0  PLT 413    Basic Metabolic Panel: Recent Labs  Lab  05/26/22 0114  NA 138  K 3.8  CL 102  CO2 27  GLUCOSE 105*  BUN 24*  CREATININE 2.07*  CALCIUM 9.1    GFR: Estimated Creatinine Clearance: 41.3 mL/min (A) (by C-G formula based on SCr of 2.07 mg/dL (H)).   Coagulation Profile: Recent Labs  Lab 05/26/22 0114  INR 1.1     Radiology Studies: [facility] Chest [facility] 1 View  Result Date: 05/26/2022 CLINICAL DATA:  Chest pain. EXAM: PORTABLE CHEST 1 VIEW COMPARISON:  09/28/2021. FINDINGS: Heart is enlarged and the mediastinal contour is stable. There is atherosclerotic calcification of the aorta. The pulmonary vasculature is prominent. Lung volumes are low. No consolidation, effusion, or pneumothorax. Dual lead pacemaker device is present over the left chest. No acute osseous abnormality. IMPRESSION: Cardiomegaly with mildly distended pulmonary vasculature. Electronically Signed   By: Brett Fairy M.D.   On: 05/26/2022 01:46       LOS: 0 days   [facility] Pager on [email or web address]  05/26/2022, 9:40 AM

## 2022-05-26 NOTE — ED Triage Notes (Signed)
Patient arrives via ems secondary to  mid chest pain x 3 days. Nitro x 1 and Asprin 324 mg administered prior arrival. Patient denies any chest pain at this time. Hx copd, paced on the monitor.

## 2022-05-26 NOTE — Progress Notes (Signed)
ANTICOAGULATION CONSULT NOTE - Initial Consult  Pharmacy Consult for heparin Indication: chest pain/ACS  Allergies  Allergen Reactions   Methylpyrrolidone Hives    froze the intestine   Niacin Itching, Nausea And Vomiting and Other (See Comments)    Flushing, itching, tingling    Ace Inhibitors     Other reaction(s): CKD 4   Norvasc [Amlodipine Besylate] Other (See Comments)    Swollen Feet   Other Other (See Comments)   Oxybutynin Chloride [Oxybutynin Chloride Er] Other (See Comments)    froze the intestine   Vesicare [Solifenacin Succinate] Other (See Comments)    Froze the intestine    Ciprofloxacin Rash and Other (See Comments)    Felt flushed    Oxybutynin Rash and Other (See Comments)    bowel obst   Solifenacin Rash    Patient Measurements: Height: '6\' 1"'$  (185.4 cm) Weight: 90.7 kg (200 lb) IBW/kg (Calculated) : 79.9  Vital Signs: Temp: 97.2 F (36.2 C) (01/19 0512) Temp Source: Oral (01/19 0512) BP: 167/95 (01/19 0430) Pulse Rate: 67 (01/19 0430)  Labs: Recent Labs    05/26/22 0114 05/26/22 0313  HGB 13.9  --   HCT 43.2  --   PLT 197  --   LABPROT 13.6  --   INR 1.1  --   CREATININE 2.07*  --   TROPONINIHS 16 71*    Estimated Creatinine Clearance: 41.3 mL/min (A) (by C-G formula based on SCr of 2.07 mg/dL (H)).   Medical History: Past Medical History:  Diagnosis Date   Allergy    Anxiety    Asthma    uses inhalers    Bilateral carotid bruits    Cardiac conduction disorder 2018   s/p MDT PPM   Cataract    Chronic kidney disease    bladder interstial cystitis    Chronic kidney disease (CKD), stage IV (severe) ([name])    followed by Dr. Joelyn Oms at [facility]   COPD (chronic obstructive pulmonary disease) ([place])    Coronary artery disease    Depression    Diabetes mellitus without complication ([place])    Encounter for care of pacemaker 02/13/2019   GERD (gastroesophageal reflux disease)    History of stomach ulcers 2001   Hypertension     LBBB (left bundle branch block)    Lower extremity edema    Mild intermittent asthma without complication    Mixed hyperlipidemia    Mobitz type 2 second degree AV block 04/06/2019   Neuropathy    Pacemaker: Medtronic Azure XT DR MRI [id number] PPM -  BUNDLE OF HIS pacing  02/26/2017   Scheduled Remote pacemaker check  11/12/2018:  There were 24 Fast AV episodes:  EGMs show SVTs. Episodes lasted < 2 minutes. Health trends do not demonstrate significant abnormality. Battery longevity is 9.4 - 10.3 years. RA pacing is 47.1 %, RV pacing is 40.4 %.  Clinic check 11/07/17.    Paroxysmal atrial flutter ([name])    PONV (postoperative nausea and vomiting)    Prostatitis    Recurrent upper respiratory infection (URI)    Schizophrenia ([name])    Sinus node dysfunction ([place]) 02/13/2019   Urticaria     Assessment: 62yo male c/o CP associated with SOB, initial troponin negative but now rising, to begin heparin; PAF listed in [place] but pt is not taking [place] PTA.  Goal of Therapy:  Heparin level 0.3-0.7 units/ml Monitor platelets by anticoagulation protocol: Yes   Plan:  Heparin 3000 units IV bolus x1 followed by  infusion at 1250 units/hr. Monitor heparin levels and CBC.  Wynona Neat, PharmD, BCPS  05/26/2022,5:15 AM

## 2022-05-26 NOTE — ED Provider Notes (Addendum)
[facility] Emergency Department Provider Note MRN:  [id number]  Arrival date & time: 05/26/22     Chief Complaint   Chest pain  History of Present Illness   Peter Marsh is a 62 y.o. year-old male with a history of CKD COPD, CAD, pacemaker presenting to the ED with chief complaint of chest pain.  Sudden onset chest pain with shortness of breath this evening.  Occurred while he was walking to take out the garbage bins.  Pain is now resolved.  Had some diaphoresis.  Review of Systems  A thorough review of systems was obtained and all systems are negative except as noted in the HPI and PMH.   Patient's Health History    Past Medical History:  Diagnosis Date   Allergy    Anxiety    Asthma    uses inhalers    Bilateral carotid bruits    Cardiac conduction disorder 2018   s/p MDT PPM   Cataract    Chronic kidney disease    bladder interstial cystitis    Chronic kidney disease (CKD), stage IV (severe) ([name])    followed by Dr. Joelyn Oms at [facility]   COPD (chronic obstructive pulmonary disease) ([place])    Coronary artery disease    Depression    Diabetes mellitus without complication ([place])    Encounter for care of pacemaker 02/13/2019   GERD (gastroesophageal reflux disease)    History of stomach ulcers 2001   Hypertension    LBBB (left bundle branch block)    Lower extremity edema    Mild intermittent asthma without complication    Mixed hyperlipidemia    Mobitz type 2 second degree AV block 04/06/2019   Neuropathy    Pacemaker: Medtronic Azure XT DR MRI [id number] PPM -  BUNDLE OF HIS pacing  02/26/2017   Scheduled Remote pacemaker check  11/12/2018:  There were 24 Fast AV episodes:  EGMs show SVTs. Episodes lasted < 2 minutes. Health trends do not demonstrate significant abnormality. Battery longevity is 9.4 - 10.3 years. RA pacing is 47.1 %, RV pacing is 40.4 %.  Clinic check 11/07/17.    Paroxysmal atrial flutter ([name])    PONV (postoperative  nausea and vomiting)    Prostatitis    Recurrent upper respiratory infection (URI)    Schizophrenia ([name])    Sinus node dysfunction ([place]) 02/13/2019   Urticaria     Past Surgical History:  Procedure Laterality Date   ADENOIDECTOMY     APPENDECTOMY     COLONOSCOPY     ELECTROPHYSIOLOGY STUDY N/A 02/26/2017   Procedure: ELECTROPHYSIOLOGY STUDY;  Surgeon: Evans Lance, MD;  Location: [place];  Service: Cardiovascular;  Laterality: N/A;   ESOPHAGOGASTRODUODENOSCOPY (EGD) WITH PROPOFOL N/A 09/18/2016   Procedure: ESOPHAGOGASTRODUODENOSCOPY (EGD) WITH PROPOFOL;  Surgeon: Ronnette Juniper, MD;  Location: [place];  Service: Gastroenterology;  Laterality: N/A;   LUMBAR LAMINECTOMY/DECOMPRESSION MICRODISCECTOMY  04/19/2011   Procedure: LUMBAR LAMINECTOMY/DECOMPRESSION MICRODISCECTOMY;  Surgeon: Tobi Bastos;  Location: [facility];  Service: Orthopedics;  Laterality: Left;  Hemi LAminectomy/Microdiscectomy Lumbar four  - Lumbar five  on the Left (X-Ray)   PACEMAKER IMPLANT N/A 02/26/2017   Procedure: PACEMAKER IMPLANT;  Surgeon: Evans Lance, MD;  Location: [place] [facility];  Service: Cardiovascular;  Laterality: N/A;   TONSILLECTOMY     UPPER GASTROINTESTINAL ENDOSCOPY      Family History  Problem Relation Age of Onset   High blood pressure Mother    Alzheimer's disease  Father    Colon cancer Neg Hx    Esophageal cancer Neg Hx    Rectal cancer Neg Hx    Stomach cancer Neg Hx     Social History   Socioeconomic History   Marital status: Married    Spouse name: Not on file   Number of children: 1   Years of education: 12   Highest education level: High school graduate  Occupational History   Occupation: Retired  Tobacco Use   Smoking status: Former    Packs/day: 1.00    Years: 30.00    Total pack years: 30.00    Types: Cigarettes    Quit date: 05/08/2008    Years since quitting: 14.0   Smokeless tobacco: Never  Vaping Use   Vaping Use: Never used  Substance and  Sexual Activity   Alcohol use: No   Drug use: No   Sexual activity: Never  Other Topics Concern   Not on file  Social History Narrative   Lives at home with wife.   Right-handed.   One cup caffeine per day.   Social Determinants of Health   Financial Resource Strain: Not on file  Food Insecurity: Not on file  Transportation Needs: Not on file  Physical Activity: Not on file  Stress: Not on file  Social Connections: Not on file  Intimate Partner Violence: Not on file     Physical Exam   Vitals:   05/26/22 0415 05/26/22 0430  BP: (!) 160/97 (!) 167/95  Pulse: 61 67  Resp: 15 16  Temp:    SpO2: 98% 100%    CONSTITUTIONAL: Well-appearing, NAD NEURO/PSYCH:  Alert and oriented x 3, no focal deficits EYES:  eyes equal and reactive ENT/NECK:  no LAD, no JVD CARDIO: Regular rate, well-perfused, normal S1 and S2 PULM:  CTAB no wheezing or rhonchi GI/GU:  non-distended, non-tender MSK/SPINE:  No gross deformities, no edema SKIN:  no rash, atraumatic   *Additional and/or pertinent findings included in MDM below  Diagnostic and Interventional Summary    EKG Interpretation  Date/Time:  Friday May 26 2022 01:08:46 EST Ventricular Rate:  75 PR Interval:  224 QRS Duration: 177 QT Interval:  421 QTC Calculation: 471 R Axis:   61 Text Interpretation: Sinus rhythm Prolonged PR interval Left bundle branch block Confirmed by Gerlene Fee [phone]) on 05/26/2022 1:23:00 AM       Labs Reviewed  BASIC METABOLIC PANEL - Abnormal; Notable for the following components:      Result Value   Glucose, Bld 105 (*)    BUN 24 (*)    Creatinine, Ser 2.07 (*)    GFR, Estimated 35 (*)    All other components within normal limits  TROPONIN I (HIGH SENSITIVITY) - Abnormal; Notable for the following components:   Troponin I (High Sensitivity) 71 (*)    All other components within normal limits  CBC  PROTIME-INR  BRAIN NATRIURETIC PEPTIDE  TROPONIN I (HIGH SENSITIVITY)    DG  Chest Port 1 View  Final Result      Medications - No data to display   Procedures  /  Critical Care .Critical Care  Performed by: Maudie Flakes, MD Authorized by: Maudie Flakes, MD   Critical care provider statement:    Critical care time (minutes):  35   Critical care was necessary to treat or prevent imminent or life-threatening deterioration of the following conditions: NSTEMI.   Critical care was time spent personally by me on the  following activities:  Development of treatment plan with patient or surrogate, discussions with consultants, evaluation of patient's response to treatment, examination of patient, ordering and review of laboratory studies, ordering and review of radiographic studies, ordering and performing treatments and interventions, pulse oximetry, re-evaluation of patient's condition and review of old charts   ED Course and Medical Decision Making  Initial Impression and Ddx Exertional chest pain, history of CAD, history of COPD, history of pacemaker.  Differential diagnosis includes ACS, CHF, COPD exacerbation, pneumothorax, arrhythmia.  Asymptomatic at this time, EKG demonstrating paced left bundle branch pattern, awaiting labs, chest x-ray.  Past medical/surgical history that increases complexity of ED encounter: COPD, CAD  Interpretation of Diagnostics I personally reviewed the EKG and my interpretation is as follows: Bundle branch block pattern  Labs reassuring with no significant blood count or electrolyte disturbance.  First troponin negative but second troponin climbing to 71  Patient Reassessment and Ultimate Disposition/Management     Patient continues to be symptom-free, will discuss with cardiology and plan for admission to either cardiology or medicine service.  Patient management required discussion with the following services or consulting groups:  Hospitalist Service  Complexity of Problems Addressed Acute illness or injury that poses  threat of life of bodily function  Additional Data Reviewed and Analyzed Further history obtained from: Further history from spouse/family member  Additional Factors Impacting ED Encounter Risk Consideration of hospitalization  Barth Kirks. Sedonia Small, MD [place] [email or web address]  Final Clinical Impressions(s) / ED Diagnoses     ICD-10-CM   1. Chest pain, unspecified type  R07.9       ED Discharge Orders     None        Discharge Instructions Discussed with and Provided to Patient:   Discharge Instructions   None      Maudie Flakes, MD 05/26/22 0430    Maudie Flakes, MD 05/26/22 [phone]

## 2022-05-26 NOTE — Consult Note (Addendum)
Cardiology Consultation   Patient ID: Peter Marsh MRN: [id number]; DOB: 1960-08-17  Admit date: 05/26/2022 Date of Consult: 05/26/2022  PCP:  Bernerd Limbo, MD   [facility] Cardiologist:  Evalina Field, MD  Electrophysiologist:  Cristopher Peru, MD     Nephrologist: Dr. Hollie Salk  Patient Profile:   Peter Marsh is a 62 y.o. 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 Sedonia Small.  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  He smoked for 30 years but quit in 2010.  His brother recently had stent placement at age 67.  He does not drink or do any illicit drug.  Despite coronary artery disease listed on his past medical history, patient says he has never underwent cardiac catheterization in the past.  He used to be followed by Dr. Einar Gip then later established with Dr. Audie Box.  Myoview obtained on 03/13/2016 was low risk. Echocardiogram obtained on 03/28/2016 showed EF 50%, abnormal septal wall motion due to left bundle branch block, grade 1 DD, mild MR and mild TR.  He underwent EP study and pacemaker implantation in 2018 for Stokes-Adams attacks.  EP study demonstrated no inducible SVT, VT, A-fib or atrial flutter with isoproterenol.  He also has a history of severe right hydronephrosis seen on previous renal ultrasound in August 2020.  Renal artery ultrasound in December 2020 demonstrated no evidence of left renal artery stenosis, severe hydronephrosis in the right kidney, bilateral renal  cyst.  A Myoview was ordered by Dr. Audie Box in January 2022 for the chest pain, this was never completed.  He was last seen by Dr. Audie Box in August 2022 at which time he was doing well.  He was last seen by EP service in September 2023 at which time he was doing well.  Most recent device interrogation in November 2023 demonstrated normal pacemaker function, stable lead and battery.  Although he has had passing out spells since pacemaker placement, he has not had any recent syncope in the past 2 years.  He was in his usual state of health until the morning of 05/25/2022.  He was taking out the trash, after walking waling outside his house, he started having burning sensation in his left lung and diffuse chest tightness.  Symptom lasted less than 5 minutes however severe enough where he thought he was not going to make it back to the house.  He attributed to the cold weather.  He did okay for the remainder of the day.  Around 12:30 AM in the morning of 05/26/2022, he woke up with similar chest pain, this time the symptom was more intense and lasted longer.  He called EMS, he was given nitroglycerin and aspirin with relief of symptoms.  By the time he arrived at [facility], ED, his symptom has resolved.  He has not had any chest pain since.  Significant blood work include creatinine of 2.07 which is near his baseline.  Serial troponin 16-->71.  BNP 62.  Chest x-ray showed cardiomegaly with mildly distended pulmonary  vasculature.  EKG showed paced rhythm, left bundle branch block.  Blood pressure on arrival was 160s, however trended up to 200 while in the emergency room.  According to the patient, he has blood pressures frequently high at home despite compliance with home blood pressure medications including clonidine 0.2 mg 3 times a day, Isordil 30 mg 4 times a day, metoprolol tartrate 100 mg twice a day and hydralazine 50 mg 3 times a day dosing.  Cardiology service consulted for NSTEMI.   Past Medical History:   Diagnosis Date   Allergy    Anxiety    Asthma    uses inhalers    Bilateral carotid bruits    Cardiac conduction disorder 2018   s/p MDT PPM   Cataract    Chronic kidney disease    bladder interstial cystitis    Chronic kidney disease (CKD), stage IV (severe) ([name])    followed by Dr. Joelyn Oms at [facility]   COPD (chronic obstructive pulmonary disease) ([place])    Coronary artery disease    Depression    Diabetes mellitus without complication ([place])    Encounter for care of pacemaker 02/13/2019   GERD (gastroesophageal reflux disease)    History of stomach ulcers 2001   Hypertension    LBBB (left bundle branch block)    Lower extremity edema    Mild intermittent asthma without complication    Mixed hyperlipidemia    Mobitz type 2 second degree AV block 04/06/2019   Neuropathy    Pacemaker: Medtronic Azure XT DR MRI [id number] PPM -  BUNDLE OF HIS pacing  02/26/2017   Scheduled Remote pacemaker check  11/12/2018:  There were 24 Fast AV episodes:  EGMs show SVTs. Episodes lasted < 2 minutes. Health trends do not demonstrate significant abnormality. Battery longevity is 9.4 - 10.3 years. RA pacing is 47.1 %, RV pacing is 40.4 %.  Clinic check 11/07/17.    Paroxysmal atrial flutter ([name])    PONV (postoperative nausea and vomiting)    Prostatitis    Recurrent upper respiratory infection (URI)    Schizophrenia ([name])    Sinus node dysfunction ([place]) 02/13/2019   Urticaria     Past Surgical History:  Procedure Laterality Date   ADENOIDECTOMY     APPENDECTOMY     COLONOSCOPY     ELECTROPHYSIOLOGY STUDY N/A 02/26/2017   Procedure: ELECTROPHYSIOLOGY STUDY;  Surgeon: Evans Lance, MD;  Location: [place] [facility];  Service: Cardiovascular;  Laterality: N/A;   ESOPHAGOGASTRODUODENOSCOPY (EGD) WITH PROPOFOL N/A 09/18/2016   Procedure: ESOPHAGOGASTRODUODENOSCOPY (EGD) WITH PROPOFOL;  Surgeon: Ronnette Juniper, MD;  Location: [facility] [facility];  Service: Gastroenterology;  Laterality: N/A;    LUMBAR LAMINECTOMY/DECOMPRESSION MICRODISCECTOMY  04/19/2011   Procedure: LUMBAR LAMINECTOMY/DECOMPRESSION MICRODISCECTOMY;  Surgeon: Tobi Bastos;  Location: [facility];  Service: Orthopedics;  Laterality: Left;  Hemi LAminectomy/Microdiscectomy Lumbar four  - Lumbar five  on the Left (X-Ray)   PACEMAKER IMPLANT N/A 02/26/2017   Procedure: PACEMAKER IMPLANT;  Surgeon: Evans Lance, MD;  Location: [place] [facility];  Service: Cardiovascular;  Laterality: N/A;   TONSILLECTOMY     UPPER GASTROINTESTINAL ENDOSCOPY       Home Medications:  Prior to Admission medications   Medication Sig Start Date End Date Taking? Authorizing Provider  acetaminophen (TYLENOL) 325 MG tablet Take 650 mg by mouth every 6 (six) hours as needed for mild pain or headache.   Yes [provider]  albuterol (VENTOLIN HFA) 108 (90 Base) MCG/ACT inhaler INHALE 2  PUFFS INTO THE LUNGS EVERY 6 HOURS AS NEEDED FOR WHEEZING OR SHORTNESS OF BREATH Patient taking differently: Inhale 2 puffs into the lungs every 6 (six) hours as needed for wheezing or shortness of breath. 07/11/21  Yes Althea Charon, FNP  Budeson-Glycopyrrol-Formoterol (BREZTRI AEROSPHERE) 160-9-4.8 MCG/ACT AERO Inhale 2 puffs into the lungs in the morning and at bedtime. 12/09/20  Yes Ambs, Kathrine Cords, FNP  buPROPion [facility] SR) 150 MG 12 hr tablet Take 150 mg by mouth 2 (two) times daily.   Yes [provider]  cetirizine (ZYRTEC) 10 MG tablet Take 1 tablet (10 mg total) by mouth daily. 12/09/20  Yes Ambs, Kathrine Cords, FNP  cloNIDine (CATAPRES) 0.2 MG tablet Take 1 tablet (0.2 mg total) by mouth 3 (three) times daily. 01/05/21  Yes Evans Lance, MD  docusate sodium (COLACE) 100 MG capsule Take 1 capsule (100 mg total) by mouth 2 (two) times daily. 10/25/20  Yes Thurnell Lose, MD  fluticasone (FLONASE) 50 MCG/ACT nasal spray Place 1 spray into both nostrils daily as needed for allergies. 09/08/20  Yes Ambs, Kathrine Cords, FNP  gabapentin (NEURONTIN) 100 MG  capsule Take 1 capsule (100 mg total) by mouth 3 (three) times daily. Patient taking differently: Take 100 mg by mouth daily as needed (for neuropathy in hands in feet). 09/08/19  Yes Marcial Pacas, MD  hydrALAZINE (APRESOLINE) 50 MG tablet Take 1 tablet (50 mg total) by mouth 3 (three) times daily. 04/15/20  Yes O'Neal, Cassie Freer, MD  isosorbide dinitrate (ISORDIL) 30 MG tablet TAKE 1 TABLET(30 MG) BY MOUTH FOUR TIMES DAILY Patient taking differently: Take 30 mg by mouth in the morning, at noon, in the evening, and at bedtime. TAKE 1 TABLET(30 MG) BY MOUTH FOUR TIMES DAILY 03/21/21  Yes O'Neal, Cassie Freer, MD  metoprolol tartrate (LOPRESSOR) 100 MG tablet Take 1 tablet (100 mg total) by mouth 2 (two) times daily. 04/15/20  Yes O'Neal, Cassie Freer, MD  montelukast (SINGULAIR) 10 MG tablet Take 1 tablet (10 mg total) by mouth at bedtime. 12/09/20 05/27/23 Yes Ambs, Kathrine Cords, FNP  ondansetron (ZOFRAN ODT) 4 MG disintegrating tablet Take 1 tablet (4 mg total) by mouth every 8 (eight) hours as needed for nausea or vomiting. 12/17/20  Yes [place], MD  paliperidone (INVEGA) 6 MG 24 hr tablet Take 6 mg by mouth at bedtime.   Yes [provider]  pantoprazole (PROTONIX) 40 MG tablet TAKE 1 TABLET(40 MG) BY MOUTH TWICE DAILY Patient taking differently: Take 40 mg by mouth 2 (two) times daily. 07/06/21  Yes [place], MD  sucralfate (CARAFATE) 1 GM/10ML suspension Take 10 mLs (1 g total) by mouth 4 (four) times daily -  with meals and at bedtime. Patient taking differently: Take 1 g by mouth daily as needed (for ulcers). 10/25/20  Yes Thurnell Lose, MD  Pancrelipase, Lip-Prot-Amyl, (ZENPEP) 40000-126000 units CPEP Take 1 tablet by mouth with breakfast, with lunch, and with evening meal. Patient not taking: Reported on 05/26/2022 04/12/20   [place], MD  Spacer/Aero-Holding [facility] Use as directed with inhaler. 07/12/21   Althea Charon, FNP    Inpatient  Medications: Scheduled Meds:  Budeson-Glycopyrrol-Formoterol  2 puff Inhalation BID   buPROPion  150 mg Oral BID   cloNIDine  0.2 mg Oral TID   hydrALAZINE  50 mg Oral TID   isosorbide dinitrate  30 mg Oral QID   loratadine  10 mg Oral Daily   metoprolol tartrate  100 mg Oral BID  montelukast  10 mg Oral QHS   paliperidone  6 mg Oral QHS   pantoprazole  40 mg Oral BID   Continuous Infusions:  heparin 1,250 Units/hr (05/26/22 0537)   PRN Meds: acetaminophen **OR** acetaminophen, albuterol, hydrALAZINE  Allergies:    Allergies  Allergen Reactions   Methylpyrrolidone Hives    froze the intestine   Niacin Itching, Nausea And Vomiting and Other (See Comments)    Flushing, itching, tingling    Ace Inhibitors     Other reaction(s): CKD 4   Norvasc [Amlodipine Besylate] Other (See Comments)    Swollen Feet   Other Other (See Comments)   Oxybutynin Chloride [Oxybutynin Chloride Er] Other (See Comments)    froze the intestine   Vesicare [Solifenacin Succinate] Other (See Comments)    Froze the intestine    Ciprofloxacin Rash and Other (See Comments)    Felt flushed    Oxybutynin Rash and Other (See Comments)    bowel obst   Solifenacin Rash    Social History:   Social History   Socioeconomic History   Marital status: Married    Spouse name: Not on file   Number of children: 1   Years of education: 12   Highest education level: High school graduate  Occupational History   Occupation: Retired  Tobacco Use   Smoking status: Former    Packs/day: 1.00    Years: 30.00    Total pack years: 30.00    Types: Cigarettes    Quit date: 05/08/2008    Years since quitting: 14.0   Smokeless tobacco: Never  Vaping Use   Vaping Use: Never used  Substance and Sexual Activity   Alcohol use: No   Drug use: No   Sexual activity: Never  Other Topics Concern   Not on file  Social History Narrative   Lives at home with wife.   Right-handed.   One cup caffeine per day.    Social Determinants of Health   Financial Resource Strain: Not on file  Food Insecurity: Not on file  Transportation Needs: Not on file  Physical Activity: Not on file  Stress: Not on file  Social Connections: Not on file  Intimate Partner Violence: Not on file    Family History:    Family History  Problem Relation Age of Onset   High blood pressure Mother    Alzheimer's disease Father    Colon cancer Neg Hx    Esophageal cancer Neg Hx    Rectal cancer Neg Hx    Stomach cancer Neg Hx      ROS:  Please see the history of present illness.   All other ROS reviewed and negative.     Physical Exam/Data:   Vitals:   05/26/22 0545 05/26/22 0600 05/26/22 0615 05/26/22 0645  BP: (!) 179/108 (!) 187/107 (!) 183/104   Pulse: 63 62 60 68  Resp: '15 16 15 14  '$ Temp:      TempSrc:      SpO2: 99% 100% 99% 98%  Weight:      Height:       No intake or output data in the 24 hours ending 05/26/22 0755    05/26/2022    1:07 AM 01/18/2022    9:06 AM 09/28/2021    8:37 AM  Last 3 Weights  Weight (lbs) 200 lb 203 lb 9.6 oz 209 lb  Weight (kg) 90.719 kg 92.352 kg 94.802 kg     Body mass index is 26.39 kg/m.  General:  Well nourished, well developed, in no acute distress HEENT: normal Neck: no JVD Vascular: No carotid bruits; Distal pulses 2+ bilaterally Cardiac:  normal S1, S2; RRR; no murmur  Lungs:  clear to auscultation bilaterally, no wheezing, rhonchi or rales  Abd: soft, nontender, no hepatomegaly  Ext: no edema Musculoskeletal:  No deformities, BUE and BLE strength normal and equal Skin: warm and dry  Neuro:  CNs 2-12 intact, no focal abnormalities noted Psych:  Normal affect   EKG:  The EKG was personally reviewed and demonstrates: Paced rhythm, left bundle branch block Telemetry:  Telemetry was personally reviewed and demonstrates: Paced rhythm, no significant ventricular ectopy  Relevant CV Studies:  N/A  Laboratory Data:  High Sensitivity Troponin:    Recent Labs  Lab 05/26/22 0114 05/26/22 0313  TROPONINIHS 16 71*     Chemistry Recent Labs  Lab 05/26/22 0114  NA 138  K 3.8  CL 102  CO2 27  GLUCOSE 105*  BUN 24*  CREATININE 2.07*  CALCIUM 9.1  GFRNONAA 35*  ANIONGAP 9    No results for input(s): "PROT", "ALBUMIN", "AST", "ALT", "ALKPHOS", "BILITOT" in the last 168 hours. Lipids No results for input(s): "CHOL", "TRIG", "HDL", "LABVLDL", "LDLCALC", "CHOLHDL" in the last 168 hours.  Hematology Recent Labs  Lab 05/26/22 0114  WBC 8.8  RBC 4.80  HGB 13.9  HCT 43.2  MCV 90.0  MCH 29.0  MCHC 32.2  RDW 12.6  PLT 197   Thyroid No results for input(s): "TSH", "FREET4" in the last 168 hours.  BNP Recent Labs  Lab 05/26/22 0114  BNP 62.9    DDimer No results for input(s): "DDIMER" in the last 168 hours.   Radiology/Studies:  DG Chest Port 1 View  Result Date: 05/26/2022 CLINICAL DATA:  Chest pain. EXAM: PORTABLE CHEST 1 VIEW COMPARISON:  09/28/2021. FINDINGS: Heart is enlarged and the mediastinal contour is stable. There is atherosclerotic calcification of the aorta. The pulmonary vasculature is prominent. Lung volumes are low. No consolidation, effusion, or pneumothorax. Dual lead pacemaker device is present over the left chest. No acute osseous abnormality. IMPRESSION: Cardiomegaly with mildly distended pulmonary vasculature. Electronically Signed   By: Brett Fairy M.D.   On: 05/26/2022 01:46     Assessment and Plan:   NSTEMI   -2 episode of chest pain so far.  First episode occurred while taking out the trash can yesterday morning.  Second episode woke him up from sleep around 12:30 AM this morning.  Symptoms concerning for unstable angina, second troponin elevated.  EKG showed paced rhythm with left bundle branch block.  -Continue IV heparin.  Obtain echocardiogram.  Obtain third troponin to see how high it goes.  -Will discuss with MD, patient has stage IV CKD, which placed him at high risk for contrast  nephropathy, although his symptoms suggest likely underlying coronary artery disease.  Hypertension: Uncontrolled.  Resume home medication.  Uptitrate as blood pressure allows.  History of atrial flutter not on anticoagulation due to history of GI bleeding  CKD stage IV: Creatinine 2.07, appears to be his baseline.  History of severe right hydronephrosis seen on multiple ultrasound in 2020  History of Stokes-Adams syncope s/p Medtronic dual-chamber PPM 02/26/2017   Risk Assessment/Risk Scores:     TIMI Risk Score for Unstable Angina or Non-ST Elevation MI:   The patient's TIMI risk score is 4, which indicates a 20% risk of all cause mortality, new or recurrent myocardial infarction or need for urgent revascularization in the  next 14 days.          For questions or updates, please contact [facility] Please consult [email or web address] for contact info under    Signed, Almyra Deforest, [place]  05/26/2022 7:55 AM  I have examined the patient and reviewed assessment and plan and discussed with patient.  Agree with above as stated.    Spoke at length about his personal history.  He is from [place] and then worked in [place], and then [facility].  He is a Biomedical scientist.  He is aware of CKD.  States it was from Nexium use and he has won a Public house manager.  He has had recurrent chest pain at rest and with minimal exertion.  Mildly elevated troponin.  I think he needs cath despite renal function.  I explained to him the risks of cath including renal injury.  He is aware and agreeable to cath.  Will hydrate and try to minimize dye exposure.  All questions about cath answered.   The patient understands that risks include but are not limited to stroke (1 in 1000), death (1 in 29), kidney failure [usually temporary] (1 in 500), bleeding (1 in 200), allergic reaction [possibly serious] (1 in 200), and agrees to proceed.   Higher than usual risk to kidneys explained to the patient.   Larae Grooms

## 2022-05-26 NOTE — H&P (View-Only) (Signed)
Cardiology Consultation   Patient ID: Peter Marsh MRN: [id number]; DOB: 1960-11-11  Admit date: 05/26/2022 Date of Consult: 05/26/2022  PCP:  Bernerd Limbo, MD   [facility] Cardiologist:  Evalina Field, MD  Electrophysiologist:  Cristopher Peru, MD     Nephrologist: Dr. Hollie Salk  Patient Profile:   Peter Marsh is a 62 y.o. 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 Sedonia Small.  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  Peter Marsh smoked for 30 years but quit in 2010.  His brother recently had stent placement at age 64.  Peter Marsh does not drink or do any illicit drug.  Despite coronary artery disease listed on his past medical history, patient says Peter Marsh has never underwent cardiac catheterization in the past.  Peter Marsh used to be followed by Dr. Einar Gip then later established with Dr. Audie Box.  Myoview obtained on 03/13/2016 was low risk. Echocardiogram obtained on 03/28/2016 showed EF 50%, abnormal septal wall motion due to left bundle branch block, grade 1 DD, mild MR and mild TR.  Peter Marsh underwent EP study and pacemaker implantation in 2018 for Stokes-Adams attacks.  EP study demonstrated no inducible SVT, VT, A-fib or atrial flutter with isoproterenol.  Peter Marsh also has a history of severe right hydronephrosis seen on previous renal ultrasound in August 2020.  Renal artery ultrasound in December 2020 demonstrated no evidence of left renal artery stenosis, severe hydronephrosis in the right kidney, bilateral renal  cyst.  A Myoview was ordered by Dr. Audie Box in January 2022 for the chest pain, this was never completed.  Peter Marsh was last seen by Dr. Audie Box in August 2022 at which time Peter Marsh was doing well.  Peter Marsh was last seen by EP service in September 2023 at which time Peter Marsh was doing well.  Most recent device interrogation in November 2023 demonstrated normal pacemaker function, stable lead and battery.  Although Peter Marsh has had passing out spells since pacemaker placement, Peter Marsh has not had any recent syncope in the past 2 years.  Peter Marsh was in his usual state of health until the morning of 05/25/2022.  Peter Marsh was taking out the trash, after walking waling outside his house, Peter Marsh started having burning sensation in his left lung and diffuse chest tightness.  Symptom lasted less than 5 minutes however severe enough where Peter Marsh thought Peter Marsh was not going to make it back to the house.  Peter Marsh attributed to the cold weather.  Peter Marsh did okay for the remainder of the day.  Around 12:30 AM in the morning of 05/26/2022, Peter Marsh woke up with similar chest pain, this time the symptom was more intense and lasted longer.  Peter Marsh called EMS, Peter Marsh was given nitroglycerin and aspirin with relief of symptoms.  By the time Peter Marsh arrived at [facility], ED, his symptom has resolved.  Peter Marsh has not had any chest pain since.  Significant blood work include creatinine of 2.07 which is near his baseline.  Serial troponin 16-->71.  BNP 62.  Chest x-ray showed cardiomegaly with mildly distended pulmonary  vasculature.  EKG showed paced rhythm, left bundle branch block.  Blood pressure on arrival was 160s, however trended up to 200 while in the emergency room.  According to the patient, Peter Marsh has blood pressures frequently high at home despite compliance with home blood pressure medications including clonidine 0.2 mg 3 times a day, Isordil 30 mg 4 times a day, metoprolol tartrate 100 mg twice a day and hydralazine 50 mg 3 times a day dosing.  Cardiology service consulted for NSTEMI.   Past Medical History:   Diagnosis Date   Allergy    Anxiety    Asthma    uses inhalers    Bilateral carotid bruits    Cardiac conduction disorder 2018   s/p MDT PPM   Cataract    Chronic kidney disease    bladder interstial cystitis    Chronic kidney disease (CKD), stage IV (severe) ([name])    followed by Dr. Joelyn Oms at [facility]   COPD (chronic obstructive pulmonary disease) ([place])    Coronary artery disease    Depression    Diabetes mellitus without complication ([place])    Encounter for care of pacemaker 02/13/2019   GERD (gastroesophageal reflux disease)    History of stomach ulcers 2001   Hypertension    LBBB (left bundle branch block)    Lower extremity edema    Mild intermittent asthma without complication    Mixed hyperlipidemia    Mobitz type 2 second degree AV block 04/06/2019   Neuropathy    Pacemaker: Medtronic Azure XT DR MRI [id number] PPM -  BUNDLE OF HIS pacing  02/26/2017   Scheduled Remote pacemaker check  11/12/2018:  There were 24 Fast AV episodes:  EGMs show SVTs. Episodes lasted < 2 minutes. Health trends do not demonstrate significant abnormality. Battery longevity is 9.4 - 10.3 years. RA pacing is 47.1 %, RV pacing is 40.4 %.  Clinic check 11/07/17.    Paroxysmal atrial flutter ([name])    PONV (postoperative nausea and vomiting)    Prostatitis    Recurrent upper respiratory infection (URI)    Schizophrenia ([name])    Sinus node dysfunction ([place]) 02/13/2019   Urticaria     Past Surgical History:  Procedure Laterality Date   ADENOIDECTOMY     APPENDECTOMY     COLONOSCOPY     ELECTROPHYSIOLOGY STUDY N/A 02/26/2017   Procedure: ELECTROPHYSIOLOGY STUDY;  Surgeon: Evans Lance, MD;  Location: [place] [facility];  Service: Cardiovascular;  Laterality: N/A;   ESOPHAGOGASTRODUODENOSCOPY (EGD) WITH PROPOFOL N/A 09/18/2016   Procedure: ESOPHAGOGASTRODUODENOSCOPY (EGD) WITH PROPOFOL;  Surgeon: Ronnette Juniper, MD;  Location: [place];  Service: Gastroenterology;  Laterality: N/A;    LUMBAR LAMINECTOMY/DECOMPRESSION MICRODISCECTOMY  04/19/2011   Procedure: LUMBAR LAMINECTOMY/DECOMPRESSION MICRODISCECTOMY;  Surgeon: Tobi Bastos;  Location: [facility];  Service: Orthopedics;  Laterality: Left;  Hemi LAminectomy/Microdiscectomy Lumbar four  - Lumbar five  on the Left (X-Ray)   PACEMAKER IMPLANT N/A 02/26/2017   Procedure: PACEMAKER IMPLANT;  Surgeon: Evans Lance, MD;  Location: [place];  Service: Cardiovascular;  Laterality: N/A;   TONSILLECTOMY     UPPER GASTROINTESTINAL ENDOSCOPY       Home Medications:  Prior to Admission medications   Medication Sig Start Date End Date Taking? Authorizing Provider  acetaminophen (TYLENOL) 325 MG tablet Take 650 mg by mouth every 6 (six) hours as needed for mild pain or headache.   Yes [provider]  albuterol (VENTOLIN HFA) 108 (90 Base) MCG/ACT inhaler INHALE 2  PUFFS INTO THE LUNGS EVERY 6 HOURS AS NEEDED FOR WHEEZING OR SHORTNESS OF BREATH Patient taking differently: Inhale 2 puffs into the lungs every 6 (six) hours as needed for wheezing or shortness of breath. 07/11/21  Yes Althea Charon, FNP  Budeson-Glycopyrrol-Formoterol (BREZTRI AEROSPHERE) 160-9-4.8 MCG/ACT AERO Inhale 2 puffs into the lungs in the morning and at bedtime. 12/09/20  Yes Ambs, Kathrine Cords, FNP  buPROPion [facility] SR) 150 MG 12 hr tablet Take 150 mg by mouth 2 (two) times daily.   Yes [provider]  cetirizine (ZYRTEC) 10 MG tablet Take 1 tablet (10 mg total) by mouth daily. 12/09/20  Yes Ambs, Kathrine Cords, FNP  cloNIDine (CATAPRES) 0.2 MG tablet Take 1 tablet (0.2 mg total) by mouth 3 (three) times daily. 01/05/21  Yes Evans Lance, MD  docusate sodium (COLACE) 100 MG capsule Take 1 capsule (100 mg total) by mouth 2 (two) times daily. 10/25/20  Yes Thurnell Lose, MD  fluticasone (FLONASE) 50 MCG/ACT nasal spray Place 1 spray into both nostrils daily as needed for allergies. 09/08/20  Yes Ambs, Kathrine Cords, FNP  gabapentin (NEURONTIN) 100 MG  capsule Take 1 capsule (100 mg total) by mouth 3 (three) times daily. Patient taking differently: Take 100 mg by mouth daily as needed (for neuropathy in hands in feet). 09/08/19  Yes Marcial Pacas, MD  hydrALAZINE (APRESOLINE) 50 MG tablet Take 1 tablet (50 mg total) by mouth 3 (three) times daily. 04/15/20  Yes O'Neal, Cassie Freer, MD  isosorbide dinitrate (ISORDIL) 30 MG tablet TAKE 1 TABLET(30 MG) BY MOUTH FOUR TIMES DAILY Patient taking differently: Take 30 mg by mouth in the morning, at noon, in the evening, and at bedtime. TAKE 1 TABLET(30 MG) BY MOUTH FOUR TIMES DAILY 03/21/21  Yes O'Neal, Cassie Freer, MD  metoprolol tartrate (LOPRESSOR) 100 MG tablet Take 1 tablet (100 mg total) by mouth 2 (two) times daily. 04/15/20  Yes O'Neal, Cassie Freer, MD  montelukast (SINGULAIR) 10 MG tablet Take 1 tablet (10 mg total) by mouth at bedtime. 12/09/20 05/27/23 Yes Ambs, Kathrine Cords, FNP  ondansetron (ZOFRAN ODT) 4 MG disintegrating tablet Take 1 tablet (4 mg total) by mouth every 8 (eight) hours as needed for nausea or vomiting. 12/17/20  Yes [place], MD  paliperidone (INVEGA) 6 MG 24 hr tablet Take 6 mg by mouth at bedtime.   Yes [provider]  pantoprazole (PROTONIX) 40 MG tablet TAKE 1 TABLET(40 MG) BY MOUTH TWICE DAILY Patient taking differently: Take 40 mg by mouth 2 (two) times daily. 07/06/21  Yes [place], MD  sucralfate (CARAFATE) 1 GM/10ML suspension Take 10 mLs (1 g total) by mouth 4 (four) times daily -  with meals and at bedtime. Patient taking differently: Take 1 g by mouth daily as needed (for ulcers). 10/25/20  Yes Thurnell Lose, MD  Pancrelipase, Lip-Prot-Amyl, (ZENPEP) 40000-126000 units CPEP Take 1 tablet by mouth with breakfast, with lunch, and with evening meal. Patient not taking: Reported on 05/26/2022 04/12/20   Thorn[place] Park, MD  Spacer/Aero-Holding [facility] Use as directed with inhaler. 07/12/21   Althea Charon, FNP    Inpatient  Medications: Scheduled Meds:  Budeson-Glycopyrrol-Formoterol  2 puff Inhalation BID   buPROPion  150 mg Oral BID   cloNIDine  0.2 mg Oral TID   hydrALAZINE  50 mg Oral TID   isosorbide dinitrate  30 mg Oral QID   loratadine  10 mg Oral Daily   metoprolol tartrate  100 mg Oral BID  montelukast  10 mg Oral QHS   paliperidone  6 mg Oral QHS   pantoprazole  40 mg Oral BID   Continuous Infusions:  heparin 1,250 Units/hr (05/26/22 0537)   PRN Meds: acetaminophen **OR** acetaminophen, albuterol, hydrALAZINE  Allergies:    Allergies  Allergen Reactions   Methylpyrrolidone Hives    froze the intestine   Niacin Itching, Nausea And Vomiting and Other (See Comments)    Flushing, itching, tingling    Ace Inhibitors     Other reaction(s): CKD 4   Norvasc [Amlodipine Besylate] Other (See Comments)    Swollen Feet   Other Other (See Comments)   Oxybutynin Chloride [Oxybutynin Chloride Er] Other (See Comments)    froze the intestine   Vesicare [Solifenacin Succinate] Other (See Comments)    Froze the intestine    Ciprofloxacin Rash and Other (See Comments)    Felt flushed    Oxybutynin Rash and Other (See Comments)    bowel obst   Solifenacin Rash    Social History:   Social History   Socioeconomic History   Marital status: Married    Spouse name: Not on file   Number of children: 1   Years of education: 12   Highest education level: High school graduate  Occupational History   Occupation: Retired  Tobacco Use   Smoking status: Former    Packs/day: 1.00    Years: 30.00    Total pack years: 30.00    Types: Cigarettes    Quit date: 05/08/2008    Years since quitting: 14.0   Smokeless tobacco: Never  Vaping Use   Vaping Use: Never used  Substance and Sexual Activity   Alcohol use: No   Drug use: No   Sexual activity: Never  Other Topics Concern   Not on file  Social History Narrative   Lives at home with wife.   Right-handed.   One cup caffeine per day.    Social Determinants of Health   Financial Resource Strain: Not on file  Food Insecurity: Not on file  Transportation Needs: Not on file  Physical Activity: Not on file  Stress: Not on file  Social Connections: Not on file  Intimate Partner Violence: Not on file    Family History:    Family History  Problem Relation Age of Onset   High blood pressure Mother    Alzheimer's disease Father    Colon cancer Neg Hx    Esophageal cancer Neg Hx    Rectal cancer Neg Hx    Stomach cancer Neg Hx      ROS:  Please see the history of present illness.   All other ROS reviewed and negative.     Physical Exam/Data:   Vitals:   05/26/22 0545 05/26/22 0600 05/26/22 0615 05/26/22 0645  BP: (!) 179/108 (!) 187/107 (!) 183/104   Pulse: 63 62 60 68  Resp: '15 16 15 14  '$ Temp:      TempSrc:      SpO2: 99% 100% 99% 98%  Weight:      Height:       No intake or output data in the 24 hours ending 05/26/22 0755    05/26/2022    1:07 AM 01/18/2022    9:06 AM 09/28/2021    8:37 AM  Last 3 Weights  Weight (lbs) 200 lb 203 lb 9.6 oz 209 lb  Weight (kg) 90.719 kg 92.352 kg 94.802 kg     Body mass index is 26.39 kg/m.  General:  Well nourished, well developed, in no acute distress HEENT: normal Neck: no JVD Vascular: No carotid bruits; Distal pulses 2+ bilaterally Cardiac:  normal S1, S2; RRR; no murmur  Lungs:  clear to auscultation bilaterally, no wheezing, rhonchi or rales  Abd: soft, nontender, no hepatomegaly  Ext: no edema Musculoskeletal:  No deformities, BUE and BLE strength normal and equal Skin: warm and dry  Neuro:  CNs 2-12 intact, no focal abnormalities noted Psych:  Normal affect   EKG:  The EKG was personally reviewed and demonstrates: Paced rhythm, left bundle branch block Telemetry:  Telemetry was personally reviewed and demonstrates: Paced rhythm, no significant ventricular ectopy  Relevant CV Studies:  N/A  Laboratory Data:  High Sensitivity Troponin:    Recent Labs  Lab 05/26/22 0114 05/26/22 0313  TROPONINIHS 16 71*     Chemistry Recent Labs  Lab 05/26/22 0114  NA 138  K 3.8  CL 102  CO2 27  GLUCOSE 105*  BUN 24*  CREATININE 2.07*  CALCIUM 9.1  GFRNONAA 35*  ANIONGAP 9    No results for input(s): "PROT", "ALBUMIN", "AST", "ALT", "ALKPHOS", "BILITOT" in the last 168 hours. Lipids No results for input(s): "CHOL", "TRIG", "HDL", "LABVLDL", "LDLCALC", "CHOLHDL" in the last 168 hours.  Hematology Recent Labs  Lab 05/26/22 0114  WBC 8.8  RBC 4.80  HGB 13.9  HCT 43.2  MCV 90.0  MCH 29.0  MCHC 32.2  RDW 12.6  PLT 197   Thyroid No results for input(s): "TSH", "FREET4" in the last 168 hours.  BNP Recent Labs  Lab 05/26/22 0114  BNP 62.9    DDimer No results for input(s): "DDIMER" in the last 168 hours.   Radiology/Studies:  DG Chest Port 1 View  Result Date: 05/26/2022 CLINICAL DATA:  Chest pain. EXAM: PORTABLE CHEST 1 VIEW COMPARISON:  09/28/2021. FINDINGS: Heart is enlarged and the mediastinal contour is stable. There is atherosclerotic calcification of the aorta. The pulmonary vasculature is prominent. Lung volumes are low. No consolidation, effusion, or pneumothorax. Dual lead pacemaker device is present over the left chest. No acute osseous abnormality. IMPRESSION: Cardiomegaly with mildly distended pulmonary vasculature. Electronically Signed   By: Brett Fairy M.D.   On: 05/26/2022 01:46     Assessment and Plan:   NSTEMI   -2 episode of chest pain so far.  First episode occurred while taking out the trash can yesterday morning.  Second episode woke him up from sleep around 12:30 AM this morning.  Symptoms concerning for unstable angina, second troponin elevated.  EKG showed paced rhythm with left bundle branch block.  -Continue IV heparin.  Obtain echocardiogram.  Obtain third troponin to see how high it goes.  -Will discuss with MD, patient has stage IV CKD, which placed him at high risk for contrast  nephropathy, although his symptoms suggest likely underlying coronary artery disease.  Hypertension: Uncontrolled.  Resume home medication.  Uptitrate as blood pressure allows.  History of atrial flutter not on anticoagulation due to history of GI bleeding  CKD stage IV: Creatinine 2.07, appears to be his baseline.  History of severe right hydronephrosis seen on multiple ultrasound in 2020  History of Stokes-Adams syncope s/p Medtronic dual-chamber PPM 02/26/2017   Risk Assessment/Risk Scores:     TIMI Risk Score for Unstable Angina or Non-ST Elevation MI:   The patient's TIMI risk score is 4, which indicates a 20% risk of all cause mortality, new or recurrent myocardial infarction or need for urgent revascularization in the  next 14 days.          For questions or updates, please contact [place] Please consult [email or web address] for contact info under    Signed, Almyra Deforest, [place]  05/26/2022 7:55 AM  I have examined the patient and reviewed assessment and plan and discussed with patient.  Agree with above as stated.    Spoke at length about his personal history.  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  Peter Marsh is aware of CKD.  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  Peter Marsh has had recurrent chest pain at rest and with minimal exertion.  Mildly elevated troponin.  I think Peter Marsh needs cath despite renal function.  I explained to him the risks of cath including renal injury.  Peter Marsh is aware and agreeable to cath.  Will hydrate and try to minimize dye exposure.  All questions about cath answered.   The patient understands that risks include but are not limited to stroke (1 in 1000), death (1 in 50), kidney failure [usually temporary] (1 in 500), bleeding (1 in 200), allergic reaction [possibly serious] (1 in 200), and agrees to proceed.   Higher than usual risk to kidneys explained to the patient.   Larae Grooms

## 2022-05-26 NOTE — Progress Notes (Signed)
Echocardiogram 2D Echocardiogram has been performed.  Frances Furbish 05/26/2022, 1:28 PM

## 2022-05-26 NOTE — ED Notes (Signed)
ED TO INPATIENT HANDOFF REPORT  ED Nurse Name and Phone #:   S Name/Age/Gender Peter Marsh 62 y.o. male Room/Bed: 002C/002C  Code Status   Code Status: Full Code  Home/SNF/Other Home Patient oriented to: self, place, time, and situation Is this baseline? Yes   Triage Complete: Triage complete  Chief Complaint NSTEMI (non-ST elevated myocardial infarction) [facility]) [I21.4]  Triage Note Patient arrives via ems secondary to  mid chest pain x 3 days. Nitro x 1 and Asprin 324 mg administered prior arrival. Patient denies any chest pain at this time. Hx copd, paced on the monitor.    Allergies Allergies  Allergen Reactions   Methylpyrrolidone Hives    froze the intestine   Niacin Itching, Nausea And Vomiting and Other (See Comments)    Flushing, itching, tingling    Ace Inhibitors     Other reaction(s): CKD 4   Norvasc [Amlodipine Besylate] Other (See Comments)    Swollen Feet   Other Other (See Comments)   Oxybutynin Chloride [Oxybutynin Chloride Er] Other (See Comments)    froze the intestine   Vesicare [Solifenacin Succinate] Other (See Comments)    Froze the intestine    Ciprofloxacin Rash and Other (See Comments)    Felt flushed    Oxybutynin Rash and Other (See Comments)    bowel obst   Solifenacin Rash    Level of Care/Admitting Diagnosis ED Disposition     ED Disposition  Admit   Condition  --   [place]: [place] [[id number]]  Level of Care: Progressive [102]  Admit to Progressive based on following criteria: CARDIOVASCULAR & THORACIC of moderate stability with acute coronary syndrome symptoms/low risk myocardial infarction/hypertensive urgency/arrhythmias/heart failure potentially compromising stability and stable post cardiovascular intervention patients.  May place patient in observation at [facility] or [place] if equivalent level of care is available:: Yes  Covid Evaluation: Asymptomatic - no recent exposure  (last 10 days) testing not required  Diagnosis: NSTEMI (non-ST elevated myocardial infarction) [facility]) [[id number]]  Admitting Physician: Shela Leff [[id number]]  Attending Physician: Shela Leff [[id number]]          B Medical/Surgery History Past Medical History:  Diagnosis Date   Allergy    Anxiety    Asthma    uses inhalers    Bilateral carotid bruits    Cardiac conduction disorder 2018   s/p MDT PPM   Cataract    Chronic kidney disease    bladder interstial cystitis    Chronic kidney disease (CKD), stage IV (severe) ([place])    followed by Dr. Joelyn Oms at [facility]   COPD (chronic obstructive pulmonary disease) ([place])    Coronary artery disease    Depression    Diabetes mellitus without complication ([place])    Encounter for care of pacemaker 02/13/2019   GERD (gastroesophageal reflux disease)    History of stomach ulcers 2001   Hypertension    LBBB (left bundle branch block)    Lower extremity edema    Mild intermittent asthma without complication    Mixed hyperlipidemia    Mobitz type 2 second degree AV block 04/06/2019   Neuropathy    Pacemaker: Medtronic Azure XT DR MRI [id number] PPM -  BUNDLE OF HIS pacing  02/26/2017   Scheduled Remote pacemaker check  11/12/2018:  There were 24 Fast AV episodes:  EGMs show SVTs. Episodes lasted < 2 minutes. Health trends do not demonstrate significant abnormality. Battery longevity is 9.4 - 10.3 years. RA  pacing is 47.1 %, RV pacing is 40.4 %.  Clinic check 11/07/17.    Paroxysmal atrial flutter ([name])    PONV (postoperative nausea and vomiting)    Prostatitis    Recurrent upper respiratory infection (URI)    Schizophrenia ([name])    Sinus node dysfunction ([place]) 02/13/2019   Urticaria    Past Surgical History:  Procedure Laterality Date   ADENOIDECTOMY     APPENDECTOMY     COLONOSCOPY     ELECTROPHYSIOLOGY STUDY N/A 02/26/2017   Procedure: ELECTROPHYSIOLOGY STUDY;  Surgeon: Evans Lance, MD;  Location: [facility];  Service: Cardiovascular;  Laterality: N/A;   ESOPHAGOGASTRODUODENOSCOPY (EGD) WITH PROPOFOL N/A 09/18/2016   Procedure: ESOPHAGOGASTRODUODENOSCOPY (EGD) WITH PROPOFOL;  Surgeon: Ronnette Juniper, MD;  Location: [place];  Service: Gastroenterology;  Laterality: N/A;   LUMBAR LAMINECTOMY/DECOMPRESSION MICRODISCECTOMY  04/19/2011   Procedure: LUMBAR LAMINECTOMY/DECOMPRESSION MICRODISCECTOMY;  Surgeon: Tobi Bastos;  Location: [facility];  Service: Orthopedics;  Laterality: Left;  Hemi LAminectomy/Microdiscectomy Lumbar four  - Lumbar five  on the Left (X-Ray)   PACEMAKER IMPLANT N/A 02/26/2017   Procedure: PACEMAKER IMPLANT;  Surgeon: Evans Lance, MD;  Location: [facility];  Service: Cardiovascular;  Laterality: N/A;   TONSILLECTOMY     UPPER GASTROINTESTINAL ENDOSCOPY       A IV Location/Drains/Wounds Patient Lines/Drains/Airways Status     Active Line/Drains/Airways     Name Placement date Placement time Site Days   Peripheral IV 05/26/22 18 G Left Antecubital 05/26/22  0116  Antecubital  less than 1   Incision 04/19/11 Back Other (Comment) 04/19/11  1049  -- 4055   Incision (Closed) 02/27/17 Chest Left 02/27/17  0815  -- 1914            Intake/Output Last 24 hours  Intake/Output Summary (Last 24 hours) at 05/26/2022 1416 Last data filed at 05/26/2022 1107 Gross per 24 hour  Intake 285.71 ml  Output --  Net 285.71 ml    Labs/Imaging Results for orders placed or performed during the hospital encounter of 05/26/22 (from the past 48 hour(s))  CBC     Status: None   Collection Time: 05/26/22  1:14 AM  Result Value Ref Range   WBC 8.8 4.0 - 10.5 K/uL   RBC 4.80 4.22 - 5.81 MIL/uL   Hemoglobin 13.9 13.0 - 17.0 g/dL   HCT 43.2 39.0 - 52.0 %   MCV 90.0 80.0 - 100.0 fL   MCH 29.0 26.0 - 34.0 pg   MCHC 32.2 30.0 - 36.0 g/dL   RDW 12.6 11.5 - 15.5 %   Platelets 197 150 - 400 K/uL   nRBC 0.0 0.0 - 0.2 %    Comment: Performed at [facility], [place]  [address], [place], [place] [address]  Basic metabolic panel     Status: Abnormal   Collection Time: 05/26/22  1:14 AM  Result Value Ref Range   Sodium 138 135 - 145 mmol/L   Potassium 3.8 3.5 - 5.1 mmol/L   Chloride 102 98 - 111 mmol/L   CO2 27 22 - 32 mmol/L   Glucose, Bld 105 (H) 70 - 99 mg/dL    Comment: Glucose reference range applies only to samples taken after fasting for at least 8 hours.   BUN 24 (H) 8 - 23 mg/dL   Creatinine, Ser 2.07 (H) 0.61 - 1.24 mg/dL   Calcium 9.1 8.9 - 10.3 mg/dL   GFR, Estimated 35 (L) >60 mL/min  Comment: (NOTE) Calculated using the CKD-EPI Creatinine Equation (2021)    Anion gap 9 5 - 15    Comment: Performed at [facility], [place] [address] [place], [place] [address]  Troponin I (High Sensitivity)     Status: None   Collection Time: 05/26/22  1:14 AM  Result Value Ref Range   Troponin I (High Sensitivity) 16 <18 ng/L    Comment: (NOTE) Elevated high sensitivity troponin I (hsTnI) values and significant  changes across serial measurements may suggest ACS but many other  chronic and acute conditions are known to elevate hsTnI results.  Refer to the "Links" section for chest pain algorithms and additional  guidance. Performed at [facility], [place] [address] [place], [place] [address]   Protime-INR     Status: None   Collection Time: 05/26/22  1:14 AM  Result Value Ref Range   Prothrombin Time 13.6 11.4 - 15.2 seconds   INR 1.1 0.8 - 1.2    Comment: (NOTE) INR goal varies based on device and disease states. Performed at [facility], [place] [address], [place], [place] [address]   Brain natriuretic peptide     Status: None   Collection Time: 05/26/22  1:14 AM  Result Value Ref Range   B Natriuretic Peptide 62.9 0.0 - 100.0 pg/mL    Comment: Performed at [facility] [address], [place], [place] [address]  Troponin I (High Sensitivity)     Status: Abnormal   Collection Time: 05/26/22  3:13 AM  Result Value Ref  Range   Troponin I (High Sensitivity) 71 (H) <18 ng/L    Comment: RESULT CALLED TO, READ BACK BY AND VERIFIED WITH STEFANIA SERIANNI RN 05/26/22 0414 M KOROLESKI (NOTE) Elevated high sensitivity troponin I (hsTnI) values and significant  changes across serial measurements may suggest ACS but many other  chronic and acute conditions are known to elevate hsTnI results.  Refer to the "Links" section for chest pain algorithms and additional  guidance. Performed at [facility], [place] [address] [place], [place] [address]   Troponin I (High Sensitivity)     Status: Abnormal   Collection Time: 05/26/22  7:59 AM  Result Value Ref Range   Troponin I (High Sensitivity) 258 (HH) <18 ng/L    Comment: CRITICAL RESULT CALLED TO, READ BACK BY AND VERIFIED WITH [place] Flatley THORTON RN.'@[id number]'$  ON 1.19.24 BY TCALDWELL MT. (NOTE) Elevated high sensitivity troponin I (hsTnI) values and significant  changes across serial measurements may suggest ACS but many other  chronic and acute conditions are known to elevate hsTnI results.  Refer to the "Links" section for chest pain algorithms and additional  guidance. Performed at [facility], [place] [address], [place], [place] [address]   HIV Antibody (routine testing w rflx)     Status: None   Collection Time: 05/26/22  7:59 AM  Result Value Ref Range   HIV Screen 4th Generation wRfx Non Reactive Non Reactive    Comment: Performed at [facility], [place] [address], [place], [place] [address]   ECHOCARDIOGRAM COMPLETE  Result Date: 05/26/2022    ECHOCARDIOGRAM REPORT   Patient Name:   Peter Marsh Date of Exam: 05/26/2022 Medical Rec #:  [id number]       Height:       73.0 in Accession #:    [id number]      Weight:       200.0 lb Date of  Birth:  06/07/1960       BSA:          2.152 m Patient Age:    89 years        BP:           128/79 mmHg Patient Gender: M               HR:           63 bpm. Exam Location:  Inpatient Procedure: 2D Echo, Cardiac  Doppler, Color Doppler and Intracardiac            Opacification Agent Indications:    chest pain  History:        Patient has prior history of Echocardiogram examinations. CAD,                 Pacemaker, Arrythmias:Atrial Flutter and LBBB; Risk                 Factors:Diabetes, Hypertension and Dyslipidemia.  Sonographer:    Phineas Douglas Referring Phys: [id number] VASUNDHRA RATHORE IMPRESSIONS  1. Left ventricular ejection fraction, by estimation, is 40 to 45%. The left ventricle has mildly decreased function. The left ventricle demonstrates regional wall motion abnormalities. Abnormal (paradoxical) septal motion, consistent with left bundle branch block. Apical hypokinesis.     There is severe asymmetric left ventricular hypertrophy of the basal-septal segment. Left ventricular diastolic parameters are consistent with Grade I diastolic dysfunction (impaired relaxation).  2. Right ventricular systolic function is normal. The right ventricular size is normal.  3. The mitral valve is normal in structure. Trivial mitral valve regurgitation. No evidence of mitral stenosis.  4. The aortic valve is tricuspid. Aortic valve regurgitation is not visualized. No aortic stenosis is present. FINDINGS  Left Ventricle: Left ventricular ejection fraction, by estimation, is 40 to 45%. The left ventricle has mildly decreased function. The left ventricle demonstrates regional wall motion abnormalities. Definity contrast agent was given IV to delineate the left ventricular endocardial borders. The left ventricular internal cavity size was normal in size. There is severe asymmetric left ventricular hypertrophy of the basal-septal segment. Abnormal (paradoxical) septal motion, consistent with left bundle branch block. Left ventricular diastolic parameters are consistent with Grade I diastolic dysfunction (impaired relaxation).  LV Wall Scoring: The entire septum, apical anterior segment, and apex are hypokinetic. The anterior wall, entire  lateral wall, and entire inferior wall are normal. Right Ventricle: The right ventricular size is normal. No increase in right ventricular wall thickness. Right ventricular systolic function is normal. Left Atrium: Left atrial size was normal in size. Right Atrium: Right atrial size was normal in size. Pericardium: There is no evidence of pericardial effusion. Presence of epicardial fat layer. Mitral Valve: The mitral valve is normal in structure. Trivial mitral valve regurgitation. No evidence of mitral valve stenosis. Tricuspid Valve: The tricuspid valve is normal in structure. Tricuspid valve regurgitation is trivial. Aortic Valve: The aortic valve is tricuspid. Aortic valve regurgitation is not visualized. No aortic stenosis is present. Pulmonic Valve: The pulmonic valve was not well visualized. Pulmonic valve regurgitation is not visualized. Aorta: The aortic root and ascending aorta are structurally normal, with no evidence of dilitation. IAS/Shunts: The interatrial septum was not well visualized.  LEFT VENTRICLE PLAX 2D LVIDd:         4.00 cm      Diastology LVIDs:         3.20 cm      LV e' medial:    4.38 cm/s  LV PW:         1.50 cm      LV E/e' medial:  8.8 LV IVS:        1.70 cm      LV e' lateral:   5.76 cm/s LVOT diam:     1.90 cm      LV E/e' lateral: 6.7 LV SV:         58 LV SV Index:   27 LVOT Area:     2.84 cm  LV Volumes (MOD) LV vol d, MOD A2C: 124.0 ml LV vol d, MOD A4C: 130.0 ml LV vol s, MOD A2C: 73.9 ml LV vol s, MOD A4C: 79.4 ml LV SV MOD A2C:     50.1 ml LV SV MOD A4C:     130.0 ml LV SV MOD BP:      54.2 ml RIGHT VENTRICLE RV Basal diam:  4.10 cm RV S prime:     7.35 cm/s TAPSE (M-mode): 1.6 cm LEFT ATRIUM             Index        RIGHT ATRIUM           Index LA diam:        3.20 cm 1.49 cm/m   RA Area:     17.60 cm LA Vol (A2C):   66.0 ml 30.67 ml/m  RA Volume:   49.40 ml  22.96 ml/m LA Vol (A4C):   39.9 ml 18.54 ml/m LA Biplane Vol: 56.0 ml 26.03 ml/m  AORTIC VALVE LVOT Vmax:    93.80 cm/s LVOT Vmean:  63.700 cm/s LVOT VTI:    0.203 m  AORTA Ao Root diam: 3.40 cm Ao Asc diam:  3.40 cm MITRAL VALVE               TRICUSPID VALVE MV Area (PHT): 3.50 cm    TR Peak grad:   20.1 mmHg MV Decel Time: 217 msec    TR Vmax:        224.00 cm/s MV E velocity: 38.70 cm/s MV A velocity: 65.10 cm/s  SHUNTS MV E/A ratio:  0.59        Systemic VTI:  0.20 m                            Systemic Diam: 1.90 cm Oswaldo Milian MD Electronically signed by Oswaldo Milian MD Signature Date/Time: 05/26/2022/1:51:47 PM    Final    DG Chest Port 1 View  Result Date: 05/26/2022 CLINICAL DATA:  Chest pain. EXAM: PORTABLE CHEST 1 VIEW COMPARISON:  09/28/2021. FINDINGS: Heart is enlarged and the mediastinal contour is stable. There is atherosclerotic calcification of the aorta. The pulmonary vasculature is prominent. Lung volumes are low. No consolidation, effusion, or pneumothorax. Dual lead pacemaker device is present over the left chest. No acute osseous abnormality. IMPRESSION: Cardiomegaly with mildly distended pulmonary vasculature. Electronically Signed   By: Brett Fairy M.D.   On: 05/26/2022 01:46    Pending Labs Unresulted Labs (From admission, onward)     Start     Ordered   05/27/22 0500  Heparin level (unfractionated)  Daily,   R     See Hyperspace for full Linked Orders Report.   05/26/22 0528   05/27/22 0500  CBC  Daily,   R     See Hyperspace for full Linked Orders Report.   05/26/22 0528   05/27/22 0623  Basic metabolic  panel  Daily,   R      05/26/22 0945   05/26/22 1400  Heparin level (unfractionated)  Once-Timed,   URGENT        05/26/22 0528            Vitals/Pain Today's Vitals   05/26/22 1315 05/26/22 1330 05/26/22 1357 05/26/22 1400  BP: (!) 145/93 (!) 147/89  (!) 147/116  Pulse: 60 64  (!) 59  Resp: '18 15  15  '$ Temp:   98.3 F (36.8 C)   TempSrc:   Oral   SpO2: 96% 96%  99%  Weight:      Height:      PainSc:        Isolation Precautions No active  isolations  Medications Medications  heparin ADULT infusion 100 units/mL (25000 units/259m) (1,250 Units/hr Intravenous New Bag/Given 05/26/22 1035)  cloNIDine (CATAPRES) tablet 0.2 mg (0.2 mg Oral Given 05/26/22 0830)  hydrALAZINE (APRESOLINE) tablet 50 mg (50 mg Oral Given 05/26/22 0830)  isosorbide dinitrate (ISORDIL) tablet 30 mg (30 mg Oral Given 05/26/22 1106)  metoprolol tartrate (LOPRESSOR) tablet 100 mg (100 mg Oral Given 05/26/22 0830)  hydrALAZINE (APRESOLINE) injection 5 mg (has no administration in time range)  buPROPion (WELLBUTRIN SR) 12 hr tablet 150 mg (150 mg Oral Given 05/26/22 1106)  paliperidone (INVEGA) 24 hr tablet 6 mg (has no administration in time range)  pantoprazole (PROTONIX) EC tablet 40 mg (40 mg Oral Given 05/26/22 1106)  Budeson-Glycopyrrol-Formoterol 160-9-4.8 MCG/ACT AERO 2 puff (2 puffs Inhalation Not Given 05/26/22 1029)  loratadine (CLARITIN) tablet 10 mg (10 mg Oral Given 05/26/22 1106)  montelukast (SINGULAIR) tablet 10 mg (has no administration in time range)  acetaminophen (TYLENOL) tablet 650 mg (has no administration in time range)    Or  acetaminophen (TYLENOL) suppository 650 mg (has no administration in time range)  albuterol (PROVENTIL) (2.5 MG/3ML) 0.083% nebulizer solution 2.5 mg (has no administration in time range)  sodium chloride flush (NS) 0.9 % injection 3 mL (3 mLs Intravenous Not Given 05/26/22 1107)  0.9% sodium chloride infusion (0 mL/kg/hr  90.7 kg Intravenous Stopped 05/26/22 1107)    Followed by  0.9% sodium chloride infusion (1 mL/kg/hr  90.7 kg Intravenous New Bag/Given 05/26/22 1108)  perflutren lipid microspheres (DEFINITY) IV suspension (5 mLs Intravenous Given 05/26/22 1329)  heparin bolus via infusion 3,000 Units (3,000 Units Intravenous Bolus from Bag 05/26/22 0538)  aspirin chewable tablet 81 mg (81 mg Oral Given 05/26/22 1225)    Mobility walks     Focused Assessments Cardiac Assessment Handoff:  Cardiac Rhythm: Heart  block Lab Results  Component Value Date   CKTOTAL 73 01/10/2010   CKMB 1.0 01/10/2010   TROPONINI <0.03 06/24/2018   Lab Results  Component Value Date   DDIMER 1.84 (H) 10/25/2020   Does the Patient currently have chest pain? No    R Recommendations: See Admitting Provider Note  Report given to:   Additional Notes: Pt going to cath lab

## 2022-05-26 NOTE — Interval H&P Note (Signed)
Cath Lab Visit (complete for each Cath Lab visit)  Clinical Evaluation Leading to the Procedure:   ACS: Yes.    Non-ACS:    Anginal Classification: CCS IV  Anti-ischemic medical therapy: Minimal Therapy (1 class of medications)  Non-Invasive Test Results: No non-invasive testing performed  Prior CABG: No previous CABG      History and Physical Interval Note:  05/26/2022 2:33 PM  Peter Marsh  has presented today for surgery, with the diagnosis of nstemi.  The various methods of treatment have been discussed with the patient and family. After consideration of risks, benefits and other options for treatment, the patient has consented to  Procedure(s): LEFT HEART CATH AND CORONARY ANGIOGRAPHY (N/A) as a surgical intervention.  The patient's history has been reviewed, patient examined, no change in status, stable for surgery.  I have reviewed the patient's chart and labs.  Questions were answered to the patient's satisfaction.     Larae Grooms

## 2022-05-27 DIAGNOSIS — I4892 Unspecified atrial flutter: Secondary | ICD-10-CM | POA: Diagnosis not present

## 2022-05-27 DIAGNOSIS — I129 Hypertensive chronic kidney disease with stage 1 through stage 4 chronic kidney disease, or unspecified chronic kidney disease: Secondary | ICD-10-CM | POA: Diagnosis not present

## 2022-05-27 DIAGNOSIS — I10 Essential (primary) hypertension: Secondary | ICD-10-CM

## 2022-05-27 DIAGNOSIS — J45909 Unspecified asthma, uncomplicated: Secondary | ICD-10-CM | POA: Diagnosis not present

## 2022-05-27 DIAGNOSIS — E785 Hyperlipidemia, unspecified: Secondary | ICD-10-CM | POA: Diagnosis not present

## 2022-05-27 DIAGNOSIS — I214 Non-ST elevation (NSTEMI) myocardial infarction: Secondary | ICD-10-CM | POA: Diagnosis not present

## 2022-05-27 DIAGNOSIS — N1832 Chronic kidney disease, stage 3b: Secondary | ICD-10-CM | POA: Diagnosis not present

## 2022-05-27 DIAGNOSIS — J449 Chronic obstructive pulmonary disease, unspecified: Secondary | ICD-10-CM | POA: Diagnosis not present

## 2022-05-27 LAB — BASIC METABOLIC PANEL
Anion gap: 5 (ref 5–15)
BUN: 20 mg/dL (ref 8–23)
CO2: 25 mmol/L (ref 22–32)
Calcium: 8.6 mg/dL — ABNORMAL LOW (ref 8.9–10.3)
Chloride: 106 mmol/L (ref 98–111)
Creatinine, Ser: 1.81 mg/dL — ABNORMAL HIGH (ref 0.61–1.24)
GFR, Estimated: 41 mL/min — ABNORMAL LOW (ref >60)
Glucose, Bld: 104 mg/dL — ABNORMAL HIGH (ref 70–99)
Potassium: 4.4 mmol/L (ref 3.5–5.1)
Sodium: 136 mmol/L (ref 135–145)

## 2022-05-27 LAB — CBC
HCT: 39.1 % (ref 39.0–52.0)
Hemoglobin: 12.8 g/dL — ABNORMAL LOW (ref 13.0–17.0)
MCH: 28.8 pg (ref 26.0–34.0)
MCHC: 32.7 g/dL (ref 30.0–36.0)
MCV: 87.9 fL (ref 80.0–100.0)
Platelets: 177 10*3/uL (ref 150–400)
RBC: 4.45 MIL/uL (ref 4.22–5.81)
RDW: 12.7 % (ref 11.5–15.5)
WBC: 6.7 10*3/uL (ref 4.0–10.5)
nRBC: 0 % (ref 0.0–0.2)

## 2022-05-27 MED ORDER — ISOSORBIDE DINITRATE 30 MG PO TABS: 30.0000 mg | ORAL_TABLET | Freq: Three times a day (TID) | ORAL | 1 refills | Status: DC

## 2022-05-27 MED ORDER — ASPIRIN 81 MG PO CHEW: 81.0000 mg | CHEWABLE_TABLET | Freq: Every day | ORAL | 3 refills | Status: AC

## 2022-05-27 MED ORDER — TICAGRELOR 90 MG PO TABS: 90.0000 mg | ORAL_TABLET | Freq: Two times a day (BID) | ORAL | 2 refills | Status: DC

## 2022-05-27 MED ORDER — METOPROLOL SUCCINATE ER 100 MG PO TB24: 100.0000 mg | ORAL_TABLET | Freq: Two times a day (BID) | ORAL | 2 refills | Status: DC

## 2022-05-27 MED ORDER — METOPROLOL SUCCINATE ER 100 MG PO TB24
100.0000 mg | ORAL_TABLET | Freq: Two times a day (BID) | ORAL | Status: DC
  Administered 2022-05-27: 100 mg via ORAL
  Filled 2022-05-27: qty 1

## 2022-05-27 NOTE — Discharge Summary (Signed)
[facility]  Physician Discharge Summary   Patient ID: Peter Marsh MRN: [id number] DOB/AGE: 1960/10/14 62 y.o.  Admit date: 05/26/2022 Discharge date: 05/27/2022    PCP: Bernerd Limbo, MD  DISCHARGE DIAGNOSES:    NSTEMI (non-ST elevated myocardial infarction) [facility])   Sinus node dysfunction ([name])   Chronic kidney disease, stage 3b ([name])   GERD (gastroesophageal reflux disease)   Asthma-COPD overlap syndrome   Hyperlipidemia   Hypertensive urgency   Paroxysmal atrial flutter ([employer])   RECOMMENDATIONS FOR OUTPATIENT FOLLOW UP: Cardiology to arrange outpatient follow-up   Home Health: None Equipment/Devices: None  CODE STATUS: Full code code  DISCHARGE CONDITION: fair  Diet recommendation: Heart healthy  INITIAL HISTORY: 62 y.o. 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  Presented with chest pain.  Concerning for unstable angina.  Hospitalized for further management.    Consultants: Cardiology   Procedures: Cardiac catheterization  HOSPITAL COURSE:   NSTEMI Patient presented with chest pressure.  Had elevation in troponin.  Seen by cardiology.  Underwent cardiac catheterization with stenting of LAD.  Patient to be on aspirin and Brilinta.  Cleared by cardiology for discharge cardiology to arrange outpatient follow-up.  Essential hypertension Continue home medications.  History of sinus node dysfunction/Stokes-Adams syncope Pacemaker in situ  History of paroxysmal atrial flutter On metoprolol.  Not on anticoagulation due to history of GI bleed  Chronic kidney disease stage IIIb Stable  History of asthma/COPD is History of  anxiety depression and schizophrenia  Patient is stable.  Okay for discharge home today.     PERTINENT LABS:  The results of significant diagnostics from this hospitalization (including imaging, microbiology, ancillary and laboratory) are listed below for reference.    Labs:   Basic Metabolic Panel: Recent Labs  Lab 05/26/22 0114 05/27/22 0233  NA 138 136  K 3.8 4.4  CL 102 106  CO2 27 25  GLUCOSE 105* 104*  BUN 24* 20  CREATININE 2.07* 1.81*  CALCIUM 9.1 8.6*    CBC: Recent Labs  Lab 05/26/22 0114 05/27/22 0233  WBC 8.8 6.7  HGB 13.9 12.8*  HCT 43.2 39.1  MCV 90.0 87.9  PLT 197 177      IMAGING STUDIES CARDIAC CATHETERIZATION  Result Date: 05/26/2022   Prox RCA lesion is 25% stenosed.   Prox LAD lesion is 99% stenosed.  A drug-eluting stent was successfully placed using a SYNERGY XD 3.50X24 postdilated to greater than 4 m in diameter and optimized with intravascular ultrasound..   Post intervention, there is a 0% residual stenosis.   LV end diastolic pressure is normal.   There is no aortic valve stenosis. Successful PCI of the severe lesion in the proximal LAD.  Continue dual antiplatelet therapy for at least 12 months.  Continue aggressive secondary prevention.  Will decrease his dose of isosorbide from 4 times a day down to 3 times a day.  This can gradually be weaned off based on his symptoms.  Watch overnight.  Hydrate aggressively due to renal insufficiency.  Specific emphasis was placed on minimizing contrast.  Only 50 cc of contrast was used for the procedure.  Plan for discharge tomorrow.   ECHOCARDIOGRAM COMPLETE  Result Date: 05/26/2022    ECHOCARDIOGRAM REPORT   Patient Name:   Peter Marsh  Date of Exam: 05/26/2022 Medical Rec #:  [id number]       Height:       73.0 in Accession #:    [id number]      Weight:       200.0 lb Date of Birth:  1961-05-06       BSA:          2.152 m Patient Age:    40 years        BP:           128/79 mmHg Patient Gender: M                HR:           63 bpm. Exam Location:  Inpatient Procedure: 2D Echo, Cardiac Doppler, Color Doppler and Intracardiac            Opacification Agent Indications:    chest pain  History:        Patient has prior history of Echocardiogram examinations. CAD,                 Pacemaker, Arrythmias:Atrial Flutter and LBBB; Risk                 Factors:Diabetes, Hypertension and Dyslipidemia.  Sonographer:    Phineas Douglas Referring Phys: [id number] VASUNDHRA RATHORE IMPRESSIONS  1. Left ventricular ejection fraction, by estimation, is 40 to 45%. The left ventricle has mildly decreased function. The left ventricle demonstrates regional wall motion abnormalities. Abnormal (paradoxical) septal motion, consistent with left bundle branch block. Apical hypokinesis.     There is severe asymmetric left ventricular hypertrophy of the basal-septal segment. Left ventricular diastolic parameters are consistent with Grade I diastolic dysfunction (impaired relaxation).  2. Right ventricular systolic function is normal. The right ventricular size is normal.  3. The mitral valve is normal in structure. Trivial mitral valve regurgitation. No evidence of mitral stenosis.  4. The aortic valve is tricuspid. Aortic valve regurgitation is not visualized. No aortic stenosis is present. FINDINGS  Left Ventricle: Left ventricular ejection fraction, by estimation, is 40 to 45%. The left ventricle has mildly decreased function. The left ventricle demonstrates regional wall motion abnormalities. Definity contrast agent was given IV to delineate the left ventricular endocardial borders. The left ventricular internal cavity size was normal in size. There is severe asymmetric left ventricular hypertrophy of the basal-septal segment. Abnormal (paradoxical) septal motion, consistent with left bundle branch block. Left ventricular diastolic parameters are consistent with Grade I diastolic dysfunction (impaired relaxation).  LV Wall Scoring: The  entire septum, apical anterior segment, and apex are hypokinetic. The anterior wall, entire lateral wall, and entire inferior wall are normal. Right Ventricle: The right ventricular size is normal. No increase in right ventricular wall thickness. Right ventricular systolic function is normal. Left Atrium: Left atrial size was normal in size. Right Atrium: Right atrial size was normal in size. Pericardium: There is no evidence of pericardial effusion. Presence of epicardial fat layer. Mitral Valve: The mitral valve is normal in structure. Trivial mitral valve regurgitation. No evidence of mitral valve stenosis. Tricuspid Valve: The tricuspid valve is normal in structure. Tricuspid valve regurgitation is trivial. Aortic Valve: The aortic valve is tricuspid. Aortic valve regurgitation is not visualized. No aortic stenosis is present. Pulmonic Valve: The pulmonic valve was not well visualized. Pulmonic valve regurgitation is not visualized. Aorta: The aortic root and ascending aorta are structurally normal, with no evidence of dilitation. IAS/Shunts: The interatrial septum was not well visualized.  LEFT VENTRICLE PLAX 2D LVIDd:         4.00 cm      Diastology LVIDs:         3.20 cm      LV e' medial:    4.38 cm/s LV PW:         1.50 cm      LV E/e' medial:  8.8 LV IVS:        1.70 cm      LV e' lateral:   5.76 cm/s LVOT diam:     1.90 cm      LV E/e' lateral: 6.7 LV SV:         58 LV SV Index:   27 LVOT Area:     2.84 cm  LV Volumes (MOD) LV vol d, MOD A2C: 124.0 ml LV vol d, MOD A4C: 130.0 ml LV vol s, MOD A2C: 73.9 ml LV vol s, MOD A4C: 79.4 ml LV SV MOD A2C:     50.1 ml LV SV MOD A4C:     130.0 ml LV SV MOD BP:      54.2 ml RIGHT VENTRICLE RV Basal diam:  4.10 cm RV S prime:     7.35 cm/s TAPSE (M-mode): 1.6 cm LEFT ATRIUM             Index        RIGHT ATRIUM           Index LA diam:        3.20 cm 1.49 cm/m   RA Area:     17.60 cm LA Vol (A2C):   66.0 ml 30.67 ml/m  RA Volume:   49.40 ml  22.96 ml/m LA Vol  (A4C):   39.9 ml 18.54 ml/m LA Biplane Vol: 56.0 ml 26.03 ml/m  AORTIC VALVE LVOT Vmax:   93.80 cm/s LVOT Vmean:  63.700 cm/s LVOT VTI:    0.203 m  AORTA Ao Root diam: 3.40 cm Ao Asc diam:  3.40 cm MITRAL VALVE               TRICUSPID VALVE MV Area (PHT): 3.50 cm    TR Peak grad:   20.1 mmHg MV Decel Time: 217 msec    TR Vmax:        224.00 cm/s MV E velocity: 38.70 cm/s MV A velocity: 65.10 cm/s  SHUNTS MV E/A ratio:  0.59        Systemic VTI:  0.20 m                            Systemic Diam: 1.90 cm Oswaldo Milian MD Electronically signed by Oswaldo Milian MD Signature Date/Time: 05/26/2022/1:51:47 PM    Final    DG Chest Port 1 View  Result Date: 05/26/2022 CLINICAL DATA:  Chest pain. EXAM: PORTABLE CHEST 1 VIEW COMPARISON:  09/28/2021. FINDINGS: Heart is enlarged and the mediastinal contour is stable. There is atherosclerotic calcification of the aorta. The pulmonary vasculature is prominent. Lung volumes are low. No consolidation, effusion, or pneumothorax. Dual lead pacemaker device is present over the left chest. No acute osseous abnormality. IMPRESSION: Cardiomegaly with mildly distended pulmonary vasculature. Electronically Signed   By: Brett Fairy M.D.   On: 05/26/2022 01:46    DISCHARGE EXAMINATION: Vitals:   05/26/22 1949 05/26/22 2215 05/27/22 0333 05/27/22 1113  BP: 131/77 (!) 144/79 137/77 (!) 166/111  Pulse: 60 60 60 65  Resp:   18  Temp: 97.9 F (36.6 C)  97.7 F (36.5 C)   TempSrc: Oral  Oral   SpO2: 99%  99% 99%  Weight:      Height:       General appearance: Awake alert.  In no distress Resp: Clear to auscultation bilaterally.  Normal effort Cardio: S1-S2 is normal regular.  No S3-S4.  No rubs murmurs or bruit GI: Abdomen is soft.  Nontender nondistended.  Bowel sounds are present normal.  No masses organomegaly Extremities: No edema.  Full range of motion of lower extremities. Neurologic: Alert and oriented x3.  No focal neurological deficits.     DISPOSITION: Home  Discharge Instructions     Amb Referral to Cardiac Rehabilitation   Complete by: As directed    Diagnosis:  Coronary Stents NSTEMI     After initial evaluation and assessments completed: Virtual Based Care may be provided alone or in conjunction with Phase 2 Cardiac Rehab based on patient barriers.: Yes   Intensive Cardiac Rehabilitation (ICR) [place] location only OR Traditional Cardiac Rehabilitation (TCR) *If criteria for ICR are not met will enroll in TCR [facility] only): Yes   Call MD for:  difficulty breathing, headache or visual disturbances   Complete by: As directed    Call MD for:  extreme fatigue   Complete by: As directed    Call MD for:  persistant dizziness or light-headedness   Complete by: As directed    Call MD for:  persistant nausea and vomiting   Complete by: As directed    Call MD for:  severe uncontrolled pain   Complete by: As directed    Call MD for:  temperature >100.4   Complete by: As directed    Diet - low sodium heart healthy   Complete by: As directed    Discharge instructions   Complete by: As directed    Please take your medications as prescribed.  Please call the cardiologist office if you have any questions going forward.  You were cared for by a hospitalist during your hospital stay. If you have any questions about your discharge medications or the care you received while you were in the hospital after you are discharged, you can call the unit and asked to speak with the hospitalist on call if the hospitalist that took care of you is not available. Once you are discharged, your primary care physician will handle any further medical issues. Please note that NO REFILLS for any discharge medications will be authorized once you are discharged, as it is imperative that you return to your primary care physician (or establish a relationship with a primary care physician if you do not have one) for your aftercare needs so that they can reassess  your need for medications and monitor your lab values. If you do not have a primary care physician, you can call [phone] for a physician referral.   Increase activity slowly   Complete by: As directed           Allergies as of 05/27/2022       Reactions   Methylpyrrolidone Hives   froze the intestine   Niacin Itching, Nausea And Vomiting, Other (See Comments)   Flushing, itching, tingling    Ace Inhibitors    Other reaction(s): CKD 4   Norvasc [amlodipine Besylate] Other (See Comments)   Swollen Feet   Other Other (See Comments)   Oxybutynin Chloride [oxybutynin Chloride Er] Other (See Comments)   froze the intestine   Vesicare [solifenacin  Succinate] Other (See Comments)   Froze the intestine   Ciprofloxacin Rash, Other (See Comments)   Felt flushed   Oxybutynin Rash, Other (See Comments)   bowel obst   Solifenacin Rash        Medication List     STOP taking these medications    metoprolol tartrate 100 MG tablet Commonly known as: LOPRESSOR       TAKE these medications    acetaminophen 325 MG tablet Commonly known as: TYLENOL Take 650 mg by mouth every 6 (six) hours as needed for mild pain or headache.   albuterol 108 (90 Base) MCG/ACT inhaler Commonly known as: VENTOLIN HFA INHALE 2 PUFFS INTO THE LUNGS EVERY 6 HOURS AS NEEDED FOR WHEEZING OR SHORTNESS OF BREATH   aspirin 81 MG chewable tablet Chew 1 tablet (81 mg total) by mouth daily.   Breztri Aerosphere 160-9-4.8 MCG/ACT Aero Generic drug: Budeson-Glycopyrrol-Formoterol Inhale 2 puffs into the lungs in the morning and at bedtime.   buPROPion 150 MG 12 hr tablet Commonly known as: WELLBUTRIN SR Take 150 mg by mouth 2 (two) times daily.   cetirizine 10 MG tablet Commonly known as: ZYRTEC Take 1 tablet (10 mg total) by mouth daily.   cloNIDine 0.2 MG tablet Commonly known as: CATAPRES Take 1 tablet (0.2 mg total) by mouth 3 (three) times daily.   docusate sodium 100 MG capsule Commonly  known as: COLACE Take 1 capsule (100 mg total) by mouth 2 (two) times daily.   fluticasone 50 MCG/ACT nasal spray Commonly known as: FLONASE Place 1 spray into both nostrils daily as needed for allergies.   gabapentin 100 MG capsule Commonly known as: Neurontin Take 1 capsule (100 mg total) by mouth 3 (three) times daily. What changed:  when to take this reasons to take this   hydrALAZINE 50 MG tablet Commonly known as: APRESOLINE Take 1 tablet (50 mg total) by mouth 3 (three) times daily.   isosorbide dinitrate 30 MG tablet Commonly known as: ISORDIL Take 1 tablet (30 mg total) by mouth 3 (three) times daily. What changed:  how much to take how to take this when to take this additional instructions   metoprolol succinate 100 MG 24 hr tablet Commonly known as: TOPROL-XL Take 1 tablet (100 mg total) by mouth 2 (two) times daily. Take with or immediately following a meal.   montelukast 10 MG tablet Commonly known as: SINGULAIR Take 1 tablet (10 mg total) by mouth at bedtime.   ondansetron 4 MG disintegrating tablet Commonly known as: Zofran ODT Take 1 tablet (4 mg total) by mouth every 8 (eight) hours as needed for nausea or vomiting.   paliperidone 6 MG 24 hr tablet Commonly known as: INVEGA Take 6 mg by mouth at bedtime.   pantoprazole 40 MG tablet Commonly known as: PROTONIX TAKE 1 TABLET(40 MG) BY MOUTH TWICE DAILY What changed: See the new instructions.   Spacer/Aero-Holding [employer] Use as directed with inhaler.   sucralfate 1 GM/10ML suspension Commonly known as: Carafate Take 10 mLs (1 g total) by mouth 4 (four) times daily -  with meals and at bedtime. What changed:  when to take this reasons to take this   ticagrelor 90 MG Tabs tablet Commonly known as: BRILINTA Take 1 tablet (90 mg total) by mouth 2 (two) times daily.   Zenpep 40000-126000 units Cpep Generic drug: Pancrelipase (Lip-Prot-Amyl) Take 1 tablet by mouth with breakfast, with  lunch, and with evening meal.  Follow-up Information     Bernerd Limbo, MD Follow up in 1 week(s).   Specialty: Family Medicine Why: post hospitalization follow up Contact information: [place] [address] [phone]         Jettie Booze, MD Follow up.   Specialties: Cardiology, Radiology, Interventional Cardiology Contact information: [address] [address] [phone]                 TOTAL DISCHARGE TIME: 35 minutes  [place]  [facility] Pager on [email or web address]  05/28/2022, 11:01 AM

## 2022-05-27 NOTE — Progress Notes (Signed)
Rounding Note    Patient Name: Peter Marsh Date of Encounter: 05/27/2022  [place] Cardiologist: Evalina Field, MD   Subjective   Creatinine mildly improved (2.07 > 1.81).  BP 137/77.  Denies any chest pain or dyspnea  Inpatient Medications    Scheduled Meds:  aspirin  81 mg Oral Daily   buPROPion  150 mg Oral BID   cloNIDine  0.2 mg Oral TID   docusate sodium  100 mg Oral BID   hydrALAZINE  50 mg Oral TID   isosorbide dinitrate  30 mg Oral TID   loratadine  10 mg Oral Daily   metoprolol tartrate  100 mg Oral BID   mometasone-formoterol  2 puff Inhalation BID   montelukast  10 mg Oral QHS   paliperidone  6 mg Oral QHS   pantoprazole  40 mg Oral BID   sodium chloride flush  3 mL Intravenous Q12H   sodium chloride flush  3 mL Intravenous Q12H   ticagrelor  90 mg Oral BID   umeclidinium bromide  1 puff Inhalation Daily   Continuous Infusions:  sodium chloride     sodium chloride     PRN Meds: sodium chloride, sodium chloride, acetaminophen **OR** acetaminophen, albuterol, fluticasone, ondansetron (ZOFRAN) IV, ondansetron, sodium chloride flush, sodium chloride flush, sucralfate   Vital Signs    Vitals:   05/26/22 1916 05/26/22 1949 05/26/22 2215 05/27/22 0333  BP: 129/78 131/77 (!) 144/79 137/77  Pulse: (!) 59 60 60 60  Resp:    18  Temp:  97.9 F (36.6 C)  97.7 F (36.5 C)  TempSrc:  Oral  Oral  SpO2: 100% 99%  99%  Weight:      Height:        Intake/Output Summary (Last 24 hours) at 05/27/2022 0849 Last data filed at 05/27/2022 0740 Gross per 24 hour  Intake 2584.56 ml  Output 800 ml  Net 1784.56 ml      05/26/2022    1:07 AM 01/18/2022    9:06 AM 09/28/2021    8:37 AM  Last 3 Weights  Weight (lbs) 200 lb 203 lb 9.6 oz 209 lb  Weight (kg) 90.719 kg 92.352 kg 94.802 kg      Telemetry    A paced - Personally Reviewed  ECG    Atrial paced, rate 62, nonspecific intraventricular conduction delay- Personally Reviewed  Physical  Exam   GEN: No acute distress.   Neck: No JVD Cardiac: RRR, no murmurs, rubs, or gallops.  Respiratory: Clear to auscultation bilaterally. GI: Soft, nontender, non-distended  MS: No edema; No deformity. Neuro:  Nonfocal  Psych: Normal affect   Labs    High Sensitivity Troponin:   Recent Labs  Lab 05/26/22 0114 05/26/22 0313 05/26/22 0759  TROPONINIHS 16 71* 258*     Chemistry Recent Labs  Lab 05/26/22 0114 05/27/22 0233  NA 138 136  K 3.8 4.4  CL 102 106  CO2 27 25  GLUCOSE 105* 104*  BUN 24* 20  CREATININE 2.07* 1.81*  CALCIUM 9.1 8.6*  GFRNONAA 35* 41*  ANIONGAP 9 5    Lipids No results for input(s): "CHOL", "TRIG", "HDL", "LABVLDL", "LDLCALC", "CHOLHDL" in the last 168 hours.  Hematology Recent Labs  Lab 05/26/22 0114 05/27/22 0233  WBC 8.8 6.7  RBC 4.80 4.45  HGB 13.9 12.8*  HCT 43.2 39.1  MCV 90.0 87.9  MCH 29.0 28.8  MCHC 32.2 32.7  RDW 12.6 12.7  PLT 197 177  Thyroid No results for input(s): "TSH", "FREET4" in the last 168 hours.  BNP Recent Labs  Lab 05/26/22 0114  BNP 62.9    DDimer No results for input(s): "DDIMER" in the last 168 hours.   Radiology    CARDIAC CATHETERIZATION  Result Date: 05/26/2022   Prox RCA lesion is 25% stenosed.   Prox LAD lesion is 99% stenosed.  A drug-eluting stent was successfully placed using a SYNERGY XD 3.50X24 postdilated to greater than 4 m in diameter and optimized with intravascular ultrasound..   Post intervention, there is a 0% residual stenosis.   LV end diastolic pressure is normal.   There is no aortic valve stenosis. Successful PCI of the severe lesion in the proximal LAD.  Continue dual antiplatelet therapy for at least 12 months.  Continue aggressive secondary prevention.  Will decrease his dose of isosorbide from 4 times a day down to 3 times a day.  This can gradually be weaned off based on his symptoms.  Watch overnight.  Hydrate aggressively due to renal insufficiency.  Specific emphasis was  placed on minimizing contrast.  Only 50 cc of contrast was used for the procedure.  Plan for discharge tomorrow.   ECHOCARDIOGRAM COMPLETE  Result Date: 05/26/2022    ECHOCARDIOGRAM REPORT   Patient Name:   Peter Marsh Date of Exam: 05/26/2022 Medical Rec #:  [id number]       Height:       73.0 in Accession #:    [id number]      Weight:       200.0 lb Date of Birth:  1961/03/03       BSA:          2.152 m Patient Age:    62 years        BP:           128/79 mmHg Patient Gender: M               HR:           63 bpm. Exam Location:  Inpatient Procedure: 2D Echo, Cardiac Doppler, Color Doppler and Intracardiac            Opacification Agent Indications:    chest pain  History:        Patient has prior history of Echocardiogram examinations. CAD,                 Pacemaker, Arrythmias:Atrial Flutter and LBBB; Risk                 Factors:Diabetes, Hypertension and Dyslipidemia.  Sonographer:    Phineas Douglas Referring Phys: [id number] VASUNDHRA RATHORE IMPRESSIONS  1. Left ventricular ejection fraction, by estimation, is 40 to 45%. The left ventricle has mildly decreased function. The left ventricle demonstrates regional wall motion abnormalities. Abnormal (paradoxical) septal motion, consistent with left bundle branch block. Apical hypokinesis.     There is severe asymmetric left ventricular hypertrophy of the basal-septal segment. Left ventricular diastolic parameters are consistent with Grade I diastolic dysfunction (impaired relaxation).  2. Right ventricular systolic function is normal. The right ventricular size is normal.  3. The mitral valve is normal in structure. Trivial mitral valve regurgitation. No evidence of mitral stenosis.  4. The aortic valve is tricuspid. Aortic valve regurgitation is not visualized. No aortic stenosis is present. FINDINGS  Left Ventricle: Left ventricular ejection fraction, by estimation, is 40 to 45%. The left ventricle has mildly decreased function. The left ventricle  demonstrates regional wall  motion abnormalities. Definity contrast agent was given IV to delineate the left ventricular endocardial borders. The left ventricular internal cavity size was normal in size. There is severe asymmetric left ventricular hypertrophy of the basal-septal segment. Abnormal (paradoxical) septal motion, consistent with left bundle branch block. Left ventricular diastolic parameters are consistent with Grade I diastolic dysfunction (impaired relaxation).  LV Wall Scoring: The entire septum, apical anterior segment, and apex are hypokinetic. The anterior wall, entire lateral wall, and entire inferior wall are normal. Right Ventricle: The right ventricular size is normal. No increase in right ventricular wall thickness. Right ventricular systolic function is normal. Left Atrium: Left atrial size was normal in size. Right Atrium: Right atrial size was normal in size. Pericardium: There is no evidence of pericardial effusion. Presence of epicardial fat layer. Mitral Valve: The mitral valve is normal in structure. Trivial mitral valve regurgitation. No evidence of mitral valve stenosis. Tricuspid Valve: The tricuspid valve is normal in structure. Tricuspid valve regurgitation is trivial. Aortic Valve: The aortic valve is tricuspid. Aortic valve regurgitation is not visualized. No aortic stenosis is present. Pulmonic Valve: The pulmonic valve was not well visualized. Pulmonic valve regurgitation is not visualized. Aorta: The aortic root and ascending aorta are structurally normal, with no evidence of dilitation. IAS/Shunts: The interatrial septum was not well visualized.  LEFT VENTRICLE PLAX 2D LVIDd:         4.00 cm      Diastology LVIDs:         3.20 cm      LV e' medial:    4.38 cm/s LV PW:         1.50 cm      LV E/e' medial:  8.8 LV IVS:        1.70 cm      LV e' lateral:   5.76 cm/s LVOT diam:     1.90 cm      LV E/e' lateral: 6.7 LV SV:         58 LV SV Index:   27 LVOT Area:     2.84 cm  LV  Volumes (MOD) LV vol d, MOD A2C: 124.0 ml LV vol d, MOD A4C: 130.0 ml LV vol s, MOD A2C: 73.9 ml LV vol s, MOD A4C: 79.4 ml LV SV MOD A2C:     50.1 ml LV SV MOD A4C:     130.0 ml LV SV MOD BP:      54.2 ml RIGHT VENTRICLE RV Basal diam:  4.10 cm RV S prime:     7.35 cm/s TAPSE (M-mode): 1.6 cm LEFT ATRIUM             Index        RIGHT ATRIUM           Index LA diam:        3.20 cm 1.49 cm/m   RA Area:     17.60 cm LA Vol (A2C):   66.0 ml 30.67 ml/m  RA Volume:   49.40 ml  22.96 ml/m LA Vol (A4C):   39.9 ml 18.54 ml/m LA Biplane Vol: 56.0 ml 26.03 ml/m  AORTIC VALVE LVOT Vmax:   93.80 cm/s LVOT Vmean:  63.700 cm/s LVOT VTI:    0.203 m  AORTA Ao Root diam: 3.40 cm Ao Asc diam:  3.40 cm MITRAL VALVE               TRICUSPID VALVE MV Area (PHT): 3.50 cm    TR Peak grad:  20.1 mmHg MV Decel Time: 217 msec    TR Vmax:        224.00 cm/s MV E velocity: 38.70 cm/s MV A velocity: 65.10 cm/s  SHUNTS MV E/A ratio:  0.59        Systemic VTI:  0.20 m                            Systemic Diam: 1.90 cm Oswaldo Milian MD Electronically signed by Oswaldo Milian MD Signature Date/Time: 05/26/2022/1:51:47 PM    Final    DG Chest Port 1 View  Result Date: 05/26/2022 CLINICAL DATA:  Chest pain. EXAM: PORTABLE CHEST 1 VIEW COMPARISON:  09/28/2021. FINDINGS: Heart is enlarged and the mediastinal contour is stable. There is atherosclerotic calcification of the aorta. The pulmonary vasculature is prominent. Lung volumes are low. No consolidation, effusion, or pneumothorax. Dual lead pacemaker device is present over the left chest. No acute osseous abnormality. IMPRESSION: Cardiomegaly with mildly distended pulmonary vasculature. Electronically Signed   By: Brett Fairy M.D.   On: 05/26/2022 01:46    Cardiac Studies     Patient Profile     62 y.o. 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  EKG with left bundle branch block.  Echocardiogram with EF 40 to 45%, paradoxical septal motion consistent with left bundle branch block, apical hypokinesis, normal RV function, no significant valvular disease.  Cath showed 99% proximal LAD stenosis status post DES, 25% proximal RCA stenosis. -Continue aspirin 81 mg daily, ticagrelor 90 mg twice daily -Continue Isordil 30 mg 3 times daily -Continue metoprolol 100 mg twice daily -Reports intolerance to statin.  Plan outpatient referral to lipid clinic for PCSK9 inhibitor  Acute combined heart failure: EF 40 to 45%.  -Continue metoprolol, will consolidate to Toprol-XL  -Continue Isordil/hydralazine -Hold off on ACE/ARB/Arni given renal function  CKD stage IV: Creatinine 2.1 on admission, which appears baseline.  Only received 50 cc contrast for cath.  Monitor renal function (mildly improved at 1.8 this morning)  Atrial flutter: Currently in sinus rhythm.  Has not been on anticoagulation due to history of GI bleeding.  Will need to monitor for bleeding on DAPT  Stokes-Adams syncope: status post pacemaker 02/2017  Hypertension: On clonidine 0.2 mg 3 times daily, hydralazine 50 mg 3 times daily, Isordil 30 mg 3 times daily, metoprolol 100 mg twice daily  [facility] will sign off.    Medication Recommendations: Aspirin 81 mg daily, ticagrelor 90 mg twice daily, clonidine 0.2 mg 3 times daily, Isordil 30 mg 3 times daily, Toprol-XL 100 mg twice daily, hydralazine 50 mg 3 times daily Other recommendations (labs, testing, etc): BMET in 1 week. Follow up as an outpatient: Will schedule follow-up   For questions or updates, please contact [place] Please consult [email or web address] for contact info under        Signed, Donato Heinz, MD  05/27/2022, 8:49  AM

## 2022-05-27 NOTE — Progress Notes (Signed)
  Transition of Care [facility]) Screening Note   Patient Details  Name: Peter Marsh Date of Birth: 12/12/1960   Transition of Care [facility]) CM/SW Contact:    Bartholomew Crews, RN Phone Number: [phone] 05/27/2022, 8:15 AM  Cardiology consult - L heart cath and coronary angiography yesterday  Transition of Care Department [facility]) has reviewed patient and no TOC needs have been identified at this time. We will continue to monitor patient advancement through interdisciplinary progression rounds. If new patient transition needs arise, please place a TOC consult.

## 2022-05-27 NOTE — Progress Notes (Signed)
CARDIAC REHAB PHASE I   PRE:  Rate/Rhythm: 68 Paced  BP:  Sitting: 127/80      SaO2: 100 RA  MODE:  Ambulation: 250 ft   AD:  None  POST:  Rate/Rhythm: 98 Paced  BP:  Sitting: 160/111      SaO2: 98 RA  Pt amb with standby assistance, pt denies CP and SOB during amb and was returned to room w/o complaint.   Pt was educated on stent card, stent location, Birlinta and ASA use, wt restrictions, no baths/daily wash-ups, s/s of infection, ex guidelines (progressive walking and resistance exercise), s/s to stop exercising, NTG use and calling 911, heart healthy and diabetic diet, risk factors (T2DM, High LDLs), and CRPII. Pt received MI book and materials on exercise, diet, and CRPII. Will refer to [facility].     Christen Bame  9:05 AM 05/27/2022    Service time is from 0800 to 0907.

## 2022-05-27 NOTE — Progress Notes (Signed)
   05/27/22 1000  Mobility  Activity Ambulated independently in room  Level of Assistance Independent  Assistive Device None  Distance Ambulated (ft) 20 ft  Activity Response Tolerated well  Mobility Referral Yes  $Mobility charge 1 Mobility   Mobility Specialist Progress Note  Received pt in bed having no complaints and agreeable to mobility. Pt was asymptomatic throughout ambulation and returned to room w/o fault. Left in bed w/ call bell in reach and all needs met.   Lucious Groves Mobility Specialist  Please contact via SecureChat or Rehab office at [phone]

## 2022-05-27 NOTE — TOC Transition Note (Signed)
Transition of Care [facility]) - CM/SW Discharge Note   Patient Details  Name: Peter Marsh MRN: [id number] Date of Birth: July 28, 1960  Transition of Care [facility]) CM/SW Contact:  Bartholomew Crews, RN Phone Number: [phone] 05/27/2022, 11:01 AM   Clinical Narrative:     Acknowledging TOC consult for new Brilinta - verified with nursing that Brilinta Medicare 1 month Free Trial cards are available on department. No further TOC needs identified at this time.   Final next level of care: Home/Self Care Barriers to Discharge: No Barriers Identified   Patient Goals and CMS Choice      Discharge Placement                         Discharge Plan and Services Additional resources added to the After Visit Summary for                                       Social Determinants of Health (SDOH) Interventions SDOH Screenings   Depression (PHQ2-9): Low Risk  (11/04/2020)  Tobacco Use: Medium Risk (05/26/2022)     Readmission Risk Interventions     No data to display

## 2022-05-29 ENCOUNTER — Encounter (HOSPITAL_COMMUNITY): Payer: Self-pay | Admitting: Interventional Cardiology

## 2022-05-29 ENCOUNTER — Telehealth: Payer: Self-pay | Admitting: *Deleted

## 2022-05-29 DIAGNOSIS — E782 Mixed hyperlipidemia: Secondary | ICD-10-CM

## 2022-05-29 MED FILL — Lidocaine HCl Local Preservative Free (PF) Inj 1%: INTRAMUSCULAR | Qty: 30 | Status: AC

## 2022-05-29 MED FILL — Nitroglycerin IV Soln 100 MCG/ML in D5W: INTRA_ARTERIAL | Qty: 10 | Status: AC

## 2022-05-29 NOTE — Telephone Encounter (Signed)
Referral placed and message sent to scheduling.

## 2022-05-29 NOTE — Telephone Encounter (Signed)
-----  Message from Donato Heinz, MD sent at 05/27/2022  9:06 AM EST ----- Regarding: Lipid clinic [facility], can we refer this patient to pharmacy lipid clinic for next available appointment? Thanks, Gerald Stabs

## 2022-05-30 LAB — LIPOPROTEIN A (LPA): Lipoprotein (a): 92.2 nmol/L — ABNORMAL HIGH (ref <75.0)

## 2022-06-01 ENCOUNTER — Other Ambulatory Visit: Payer: Self-pay

## 2022-06-01 ENCOUNTER — Encounter (HOSPITAL_COMMUNITY): Payer: Self-pay | Admitting: Emergency Medicine

## 2022-06-01 ENCOUNTER — Emergency Department (HOSPITAL_COMMUNITY): Payer: 59

## 2022-06-01 ENCOUNTER — Emergency Department (HOSPITAL_COMMUNITY)
Admission: EM | Admit: 2022-06-01 | Discharge: 2022-06-01 | Disposition: A | Discharge status: Home / Self Care | Payer: 59 | Attending: Emergency Medicine | Admitting: Emergency Medicine

## 2022-06-01 DIAGNOSIS — Z7982 Long term (current) use of aspirin: Secondary | ICD-10-CM | POA: Diagnosis not present

## 2022-06-01 DIAGNOSIS — R079 Chest pain, unspecified: Secondary | ICD-10-CM | POA: Diagnosis not present

## 2022-06-01 LAB — CBC WITH DIFFERENTIAL/PLATELET
Abs Immature Granulocytes: 0.03 10*3/uL (ref 0.00–0.07)
Basophils Absolute: 0 10*3/uL (ref 0.0–0.1)
Basophils Relative: 0 %
Eosinophils Absolute: 0.5 10*3/uL (ref 0.0–0.5)
Eosinophils Relative: 6 %
HCT: 37.7 % — ABNORMAL LOW (ref 39.0–52.0)
Hemoglobin: 12.8 g/dL — ABNORMAL LOW (ref 13.0–17.0)
Immature Granulocytes: 0 %
Lymphocytes Relative: 19 %
Lymphs Abs: 1.5 10*3/uL (ref 0.7–4.0)
MCH: 29.4 pg (ref 26.0–34.0)
MCHC: 34 g/dL (ref 30.0–36.0)
MCV: 86.5 fL (ref 80.0–100.0)
Monocytes Absolute: 0.9 10*3/uL (ref 0.1–1.0)
Monocytes Relative: 11 %
Neutro Abs: 5.3 10*3/uL (ref 1.7–7.7)
Neutrophils Relative %: 64 %
Platelets: 196 10*3/uL (ref 150–400)
RBC: 4.36 MIL/uL (ref 4.22–5.81)
RDW: 12.3 % (ref 11.5–15.5)
WBC: 8.3 10*3/uL (ref 4.0–10.5)
nRBC: 0 % (ref 0.0–0.2)

## 2022-06-01 LAB — BASIC METABOLIC PANEL
Anion gap: 9 (ref 5–15)
BUN: 28 mg/dL — ABNORMAL HIGH (ref 8–23)
CO2: 20 mmol/L — ABNORMAL LOW (ref 22–32)
Calcium: 8.7 mg/dL — ABNORMAL LOW (ref 8.9–10.3)
Chloride: 106 mmol/L (ref 98–111)
Creatinine, Ser: 2.24 mg/dL — ABNORMAL HIGH (ref 0.61–1.24)
GFR, Estimated: 32 mL/min — ABNORMAL LOW (ref >60)
Glucose, Bld: 93 mg/dL (ref 70–99)
Potassium: 4.3 mmol/L (ref 3.5–5.1)
Sodium: 135 mmol/L (ref 135–145)

## 2022-06-01 LAB — TROPONIN I (HIGH SENSITIVITY)
Troponin I (High Sensitivity): 74 ng/L — ABNORMAL HIGH (ref <18)
Troponin I (High Sensitivity): 63 ng/L — ABNORMAL HIGH (ref <18)

## 2022-06-01 MED ORDER — LACTATED RINGERS IV SOLN: INTRAVENOUS | Status: DC

## 2022-06-01 NOTE — ED Provider Notes (Signed)
[facility] Provider Note   CSN: [id number] Arrival date & time: 06/01/22  1704     History  Chief Complaint  Patient presents with   Chest Pain    Peter Marsh is a 62 y.o. male.  62 year old male who presents with left-sided chest pain which began today.  Symptoms have waxed and waned.  No associated dyspnea, diaphoresis, shortness of breath.  Patient received NSTEMI and the pain currently does not feel like that.  Patient states that he has been taking scheduled nitroglycerin because he misunderstood the directions from his cardiologist.  Denies any fever, cough, congestion.  No leg pain or swelling.  EMS was called and patient was given morphine along with aspirin.  States he feels fine at this time.  States the pain is localized to the location where he had his pacemaker placed.  Denies any redness or infection to the area.      Home Medications Prior to Admission medications   Medication Sig Start Date End Date Taking? Authorizing Provider  acetaminophen (TYLENOL) 325 MG tablet Take 650 mg by mouth every 6 (six) hours as needed for mild pain or headache.    [provider]  albuterol (VENTOLIN HFA) 108 (90 Base) MCG/ACT inhaler INHALE 2 PUFFS INTO THE LUNGS EVERY 6 HOURS AS NEEDED FOR WHEEZING OR SHORTNESS OF BREATH Patient taking differently: Inhale 2 puffs into the lungs every 6 (six) hours as needed for wheezing or shortness of breath. 07/11/21   Althea Charon, FNP  aspirin 81 MG chewable tablet Chew 1 tablet (81 mg total) by mouth daily. 05/28/22   Bonnielee Haff, MD  Budeson-Glycopyrrol-Formoterol (BREZTRI AEROSPHERE) 160-9-4.8 MCG/ACT AERO Inhale 2 puffs into the lungs in the morning and at bedtime. 12/09/20   Dara Hoyer, FNP  buPROPion (WELLBUTRIN SR) 150 MG 12 hr tablet Take 150 mg by mouth 2 (two) times daily.    [provider]  cetirizine (ZYRTEC) 10 MG tablet Take 1 tablet (10 mg total) by mouth daily.  12/09/20   Dara Hoyer, FNP  cloNIDine (CATAPRES) 0.2 MG tablet Take 1 tablet (0.2 mg total) by mouth 3 (three) times daily. 01/05/21   Evans Lance, MD  docusate sodium (COLACE) 100 MG capsule Take 1 capsule (100 mg total) by mouth 2 (two) times daily. 10/25/20   Thurnell Lose, MD  fluticasone (FLONASE) 50 MCG/ACT nasal spray Place 1 spray into both nostrils daily as needed for allergies. 09/08/20   Dara Hoyer, FNP  gabapentin (NEURONTIN) 100 MG capsule Take 1 capsule (100 mg total) by mouth 3 (three) times daily. Patient taking differently: Take 100 mg by mouth daily as needed (for neuropathy in hands in feet). 09/08/19   Marcial Pacas, MD  hydrALAZINE (APRESOLINE) 50 MG tablet Take 1 tablet (50 mg total) by mouth 3 (three) times daily. 04/15/20   O'NealCassie Freer, MD  isosorbide dinitrate (ISORDIL) 30 MG tablet Take 1 tablet (30 mg total) by mouth 3 (three) times daily. 05/27/22   Bonnielee Haff, MD  metoprolol succinate (TOPROL-XL) 100 MG 24 hr tablet Take 1 tablet (100 mg total) by mouth 2 (two) times daily. Take with or immediately following a meal. 05/27/22   Bonnielee Haff, MD  montelukast (SINGULAIR) 10 MG tablet Take 1 tablet (10 mg total) by mouth at bedtime. 12/09/20 05/27/23  Dara Hoyer, FNP  ondansetron (ZOFRAN ODT) 4 MG disintegrating tablet Take 1 tablet (4 mg total) by mouth every 8 (  eight) hours as needed for nausea or vomiting. 12/17/20   Thornton Park, MD  paliperidone (INVEGA) 6 MG 24 hr tablet Take 6 mg by mouth at bedtime.    [provider]  Pancrelipase, Lip-Prot-Amyl, (ZENPEP) 40000-126000 units CPEP Take 1 tablet by mouth with breakfast, with lunch, and with evening meal. Patient not taking: Reported on 05/26/2022 04/12/20   Thornton Park, MD  pantoprazole (PROTONIX) 40 MG tablet TAKE 1 TABLET(40 MG) BY MOUTH TWICE DAILY Patient taking differently: Take 40 mg by mouth 2 (two) times daily. 07/06/21   Thornton Park, MD  Spacer/Aero-Holding Dorise Bullion Use  as directed with inhaler. 07/12/21   Althea Charon, FNP  sucralfate (CARAFATE) 1 GM/10ML suspension Take 10 mLs (1 g total) by mouth 4 (four) times daily -  with meals and at bedtime. Patient taking differently: Take 1 g by mouth daily as needed (for ulcers). 10/25/20   Thurnell Lose, MD  ticagrelor (BRILINTA) 90 MG TABS tablet Take 1 tablet (90 mg total) by mouth 2 (two) times daily. 05/27/22   Bonnielee Haff, MD      Allergies    Methylpyrrolidone, Niacin, Ace inhibitors, Norvasc [amlodipine besylate], Other, Oxybutynin chloride [oxybutynin chloride er], Vesicare [solifenacin succinate], Ciprofloxacin, Oxybutynin, and Solifenacin    Review of Systems   Review of Systems  All other systems reviewed and are negative.   Physical Exam Updated Vital Signs BP (!) 167/100   Pulse (!) 59   Temp 97.7 F (36.5 C) (Oral)   Resp 17   Ht 1.854 m ('6\' 1"'$ )   Wt 92.1 kg   SpO2 98%   BMI 26.78 kg/m  Physical Exam Vitals and nursing note reviewed.  Constitutional:      General: He is not in acute distress.    Appearance: Normal appearance. He is well-developed. He is not toxic-appearing.  HENT:     Head: Normocephalic and atraumatic.  Eyes:     General: Lids are normal.     Conjunctiva/sclera: Conjunctivae normal.     Pupils: Pupils are equal, round, and reactive to light.  Neck:     Thyroid: No thyroid mass.     Trachea: No tracheal deviation.  Cardiovascular:     Rate and Rhythm: Normal rate and regular rhythm.     Heart sounds: Normal heart sounds. No murmur heard.    No gallop.  Pulmonary:     Effort: Pulmonary effort is normal. No respiratory distress.     Breath sounds: Normal breath sounds. No stridor. No decreased breath sounds, wheezing, rhonchi or rales.  Chest:    Abdominal:     General: There is no distension.     Palpations: Abdomen is soft.     Tenderness: There is no abdominal tenderness. There is no rebound.  Musculoskeletal:        General: No tenderness.  Normal range of motion.     Cervical back: Normal range of motion and neck supple.  Skin:    General: Skin is warm and dry.     Findings: No abrasion or rash.  Neurological:     Mental Status: He is alert and oriented to person, place, and time. Mental status is at baseline.     GCS: GCS eye subscore is 4. GCS verbal subscore is 5. GCS motor subscore is 6.     Cranial Nerves: No cranial nerve deficit.     Sensory: No sensory deficit.     Motor: Motor function is intact.  Psychiatric:  Attention and Perception: Attention normal.        Speech: Speech normal.        Behavior: Behavior normal.    ED Results / Procedures / Treatments   Labs (all labs ordered are listed, but only abnormal results are displayed) Labs Reviewed  CBC WITH DIFFERENTIAL/PLATELET  BASIC METABOLIC PANEL  TROPONIN I (HIGH SENSITIVITY)    EKG EKG Interpretation  Date/Time:  Thursday June 01 2022 17:06:15 EST Ventricular Rate:  65 PR Interval:  164 QRS Duration: 178 QT Interval:  418 QTC Calculation: 435 R Axis:   62 Text Interpretation: Atrial-paced rhythm Left bundle branch block No significant change since last tracing Confirmed by Lacretia Leigh ([id number]) on 06/01/2022 5:34:48 PM  Radiology DG Chest Port 1 View  Result Date: 06/01/2022 CLINICAL DATA:  Chest pain EXAM: PORTABLE CHEST 1 VIEW COMPARISON:  05/26/2022 FINDINGS: Pacer leads unchanged. Heart size within normal limits for AP technique. Atherosclerotic calcification of the aortic arch. No blunting of the costophrenic angles.  The lungs appear clear. IMPRESSION: 1. No active cardiopulmonary disease is radiographically apparent. 2. Pacer leads unchanged. Electronically Signed   By: Van Clines M.D.   On: 06/01/2022 17:50    Procedures Procedures    Medications Ordered in ED Medications  lactated ringers infusion ( Intravenous New Bag/Given 06/01/22 1745)    ED Course/ Medical Decision Making/ A&P                              Medical Decision Making Amount and/or Complexity of Data Reviewed Labs: ordered. Radiology: ordered.  Risk Prescription drug management.   Patient is EKG is unchanged from prior.  He is in a paced rhythm.  Chest x-ray per interpretation shows no acute findings.  Patient's cardiac markers are negative x 2.  Suspect that this is not ACS or PE.  He has been stable here.  No evidence of site infection where patient has pacemaker inserted.  Patient is CKD is stable.  Will be discharged home.  Wife at bedside and is agreeable to this as well        Final Clinical Impression(s) / ED Diagnoses Final diagnoses:  None    Rx / DC Orders ED Discharge Orders     None         Lacretia Leigh, MD 06/01/22 2134

## 2022-06-01 NOTE — ED Notes (Signed)
Patient verbalizes understanding of discharge instructions. Opportunity for questioning and answers were provided. Armband removed by staff, pt discharged from ED. Pt taken to ED waiting room via wheel chair.  

## 2022-06-01 NOTE — ED Triage Notes (Signed)
Pt BIB [facility] from home due to chest pain that has progressively gotten worse over the day.  A week ago patient was here for N-Stemi and went to cath lab and got stent placed.  Pt took one nitro sublingually but did not help chest pain that started at 0700 today.  Pt also took '324mg'$  of aspirin.  [facility] gave 50mg of morphine.  18g left AC.  VS BP 188/102, HR 60-90

## 2022-06-02 ENCOUNTER — Telehealth (HOSPITAL_COMMUNITY): Payer: Self-pay

## 2022-06-02 NOTE — Telephone Encounter (Signed)
Attempted to call patient in regards to Cardiac Rehab - unable to leave VM 

## 2022-06-02 NOTE — Telephone Encounter (Signed)
Pt insurance is active and benefits verified through [facility]. Co-pay $0.00, DED $0.00/$0.00 met, out of pocket $3,600.00/$0.00 met, co-insurance 0%. No pre-authorization required. Passport, 06/02/22 @ 4:30PM, VWP#[id number]   How many CR sessions are covered? (36 sessions for TCR, 72 sessions for ICR)72 Is this a lifetime maximum or an annual maximum? Lifetime Has the member used any of these services to date? No Is there a time limit (weeks/months) on start of program and/or program completion? No     Will contact patient to see if he is interested in the Cardiac Rehab Program. If interested, patient will need to complete follow up appt. Once completed, patient will be contacted for scheduling upon review by the RN Navigator.

## 2022-06-02 NOTE — Progress Notes (Signed)
Cardiology Clinic Note   Patient Name: Peter Marsh Date of Encounter: 06/06/2022  Primary Care Provider:  Tracey Harries, MD Primary Cardiologist:  Reatha Harps, MD  Patient Profile    Peter Marsh is a 62 y.o. male with a past medical history of CAD s/p NSTEMI PCI with DES proximal LAD, LBBB/sinus node dysfunction s/p PPM 2018, PAF/a flutter not on anticoagulation secondary to GI bleed history, infrarenal abdominal aortic aneurysm, hypertension, hyperlipidemia, CKD stage IIIb, COPD, schizophrenia, history of GI bleed who presents to the clinic today for hospital follow-up post NSTEMI with PCI.  Past Medical History    Past Medical History:  Diagnosis Date   Allergy    Anxiety    Asthma    uses inhalers    Bilateral carotid bruits    Cardiac conduction disorder 2018   s/p MDT PPM   Cataract    Chronic kidney disease    bladder interstial cystitis    Chronic kidney disease (CKD), stage IV (severe) ([name])    followed by Dr. Marisue Humble at [facility]   COPD (chronic obstructive pulmonary disease) ([name])    Coronary artery disease    Depression    Diabetes mellitus without complication ([name])    Encounter for care of pacemaker 02/13/2019   GERD (gastroesophageal reflux disease)    History of stomach ulcers 2001   Hypertension    LBBB (left bundle branch block)    Lower extremity edema    Mild intermittent asthma without complication    Mixed hyperlipidemia    Mobitz type 2 second degree AV block 04/06/2019   Neuropathy    Pacemaker: Medtronic Azure XT DR MRI [id number] PPM -  BUNDLE OF HIS pacing  02/26/2017   Scheduled Remote pacemaker check  11/12/2018:  There were 24 Fast AV episodes:  EGMs show SVTs. Episodes lasted < 2 minutes. Health trends do not demonstrate significant abnormality. Battery longevity is 9.4 - 10.3 years. RA pacing is 47.1 %, RV pacing is 40.4 %.  Clinic check 11/07/17.    Paroxysmal atrial flutter ([name])    PONV (postoperative nausea and vomiting)     Prostatitis    Recurrent upper respiratory infection (URI)    Schizophrenia ([name])    Sinus node dysfunction ([name]) 02/13/2019   Urticaria    Past Surgical History:  Procedure Laterality Date   ADENOIDECTOMY     APPENDECTOMY     COLONOSCOPY     CORONARY STENT INTERVENTION N/A 05/26/2022   Procedure: CORONARY STENT INTERVENTION;  Surgeon: Corky Crafts, MD;  Location: [facility] INVASIVE CV LAB;  Service: Cardiovascular;  Laterality: N/A;   ELECTROPHYSIOLOGY STUDY N/A 02/26/2017   Procedure: ELECTROPHYSIOLOGY STUDY;  Surgeon: Marinus Maw, MD;  Location: [facility] INVASIVE CV LAB;  Service: Cardiovascular;  Laterality: N/A;   ESOPHAGOGASTRODUODENOSCOPY (EGD) WITH PROPOFOL N/A 09/18/2016   Procedure: ESOPHAGOGASTRODUODENOSCOPY (EGD) WITH PROPOFOL;  Surgeon: Kerin Salen, MD;  Location: [facility] ENDOSCOPY;  Service: Gastroenterology;  Laterality: N/A;   INTRAVASCULAR ULTRASOUND/IVUS N/A 05/26/2022   Procedure: Intravascular Ultrasound/IVUS;  Surgeon: Corky Crafts, MD;  Location: [facility] INVASIVE CV LAB;  Service: Cardiovascular;  Laterality: N/A;   LEFT HEART CATH AND CORONARY ANGIOGRAPHY N/A 05/26/2022   Procedure: LEFT HEART CATH AND CORONARY ANGIOGRAPHY;  Surgeon: Corky Crafts, MD;  Location: [facility] INVASIVE CV LAB;  Service: Cardiovascular;  Laterality: N/A;   LUMBAR LAMINECTOMY/DECOMPRESSION MICRODISCECTOMY  04/19/2011   Procedure: LUMBAR LAMINECTOMY/DECOMPRESSION MICRODISCECTOMY;  Surgeon: Jacki Cones;  Location: [facility] ORS;  Service: Orthopedics;  Laterality: Left;  Hemi LAminectomy/Microdiscectomy Lumbar four  - Lumbar five  on the Left (X-Ray)   PACEMAKER IMPLANT N/A 02/26/2017   Procedure: PACEMAKER IMPLANT;  Surgeon: Marinus Maw, MD;  Location: [facility] INVASIVE CV LAB;  Service: Cardiovascular;  Laterality: N/A;   TONSILLECTOMY     UPPER GASTROINTESTINAL ENDOSCOPY      Allergies  Allergies  Allergen Reactions   Methylpyrrolidone Hives    froze the intestine   Niacin Itching, Nausea  And Vomiting and Other (See Comments)    Flushing, itching, tingling    Ace Inhibitors     Other reaction(s): CKD 4   Norvasc [Amlodipine Besylate] Other (See Comments)    Swollen Feet   Other Other (See Comments)   Oxybutynin Chloride [Oxybutynin Chloride Er] Other (See Comments)    froze the intestine   Vesicare [Solifenacin Succinate] Other (See Comments)    Froze the intestine    Ciprofloxacin Rash and Other (See Comments)    Felt flushed    Oxybutynin Rash and Other (See Comments)    bowel obst   Solifenacin Rash    History of Present Illness    Peter Marsh has a past medical history of: CAD/ischemic cardiomyopathy. LHC 05/26/2022: Proximal RCA 25%.  Proximal LAD 99%.  PCI with DES 3.5 x 24 to proximal LAD. Echo 05/26/2022: EF 40 to 45%.  Regional wall motion abnormalities.  Abnormal septal motion consistent with LBBB.  Apical hypokinesis.  Severe asymmetric LVH of the basal septal segment.  Grade I DD.  Trivial MR. LBBB/sinus node dysfunction. PPM implantation 02/26/2017: Medtronic dual chamber PPM. Remote check 03/26/2022: Normal device function with stable leads and battery. PAF/a-flutter. Infrarenal abdominal aortic aneurysm. CT abdomen pelvis 10/21/2020: 3.2 cm infrarenal aortic aneurysm.   Evaluated by vascular surgery 12/31/2020.  Due for repeat ultrasound August 2024. Hypertension. Hyperlipidemia. Lipid panel 09/20/2020: LDL 101, HDL 26, TG 119, total 149. LPa 05/27/2022: 92.2. CKD stage IIIb. COPD. Schizophrenia. History of GI bleed.  Peter Marsh was first evaluated by Dr. Ladona Ridgel at the request of Dr. Jacinto Halim on 02/07/2017 for atrial flutter.  Diagnosis was felt to be BBRVT not a flutter and plan was made for EP study for catheter ablation versus PPM.  He underwent PPM as outlined above.  Patient had been followed in the past by Dr. Jacinto Halim for general cardiology prior to establishing with Dr. Flora Lipps. He was last seen in the office by Dr. Flora Lipps on 12/08/2020.  He was  doing well at that time and no medication changes were made.  He was evaluated by Otilio Saber, PA-C on 01/18/2022 in the office and no changes were made to his device.  Most recently patient presented to the emergency department via EMS on 05/26/2022 for sudden onset of exertional chest pain.  Nitro x 1 and 324 mg aspirin administered by EMS; patient pain-free upon arrival to ED.  Troponin peak 258.  EKG showed paced rhythm with LBBB.  Patient was taken to Cath Lab (as above).  Patient was discharged from hospital 05/27/2022. He returned to the ED via EMS from home for chest pain that had progressively gotten worse throughout the day on 06/01/2022.  Troponin 74>> 63.  Not felt to be ACS or PE.  Patient was discharged home.  Today, patient is alone. He denies any further episodes of chest pain. He feels the episode of chest pain on 1/25 is related to what he ate that day ("a cheeseburger and half a bag of chocolate chip cookies"). Prior  to that and since that day he has been watching what he eats trying to eat more vegetables. He feels his appetite is decreased. He has been slowly returning to his normal activities including walking on his property without chest pain. He reports mild swelling of bilateral ankles that is always there. Patient denies shortness of breath or dyspnea on exertion.  Denies orthopnea or PND. No palpitations. He does not weigh every day. Went over medications with patient. He reports he is allergic to statins stating "I had a severe reaction to it but it all went away once I stopped it." He is not sure what exactly his reaction was. He is very interested in attending cardiac rehab.      Home Medications    Current Meds  Medication Sig   acetaminophen (TYLENOL) 325 MG tablet Take 650 mg by mouth every 6 (six) hours as needed for mild pain or headache.   aspirin 81 MG chewable tablet Chew 1 tablet (81 mg total) by mouth daily.   Budeson-Glycopyrrol-Formoterol (BREZTRI AEROSPHERE)  160-9-4.8 MCG/ACT AERO Inhale 2 puffs into the lungs in the morning and at bedtime.   buPROPion (WELLBUTRIN SR) 150 MG 12 hr tablet Take 150 mg by mouth 2 (two) times daily.   cetirizine (ZYRTEC) 10 MG tablet Take 1 tablet (10 mg total) by mouth daily.   cloNIDine (CATAPRES) 0.2 MG tablet Take 1 tablet (0.2 mg total) by mouth 3 (three) times daily.   docusate sodium (COLACE) 100 MG capsule Take 1 capsule (100 mg total) by mouth 2 (two) times daily.   hydrALAZINE (APRESOLINE) 50 MG tablet Take 1 tablet (50 mg total) by mouth 3 (three) times daily.   isosorbide dinitrate (ISORDIL) 30 MG tablet Take 1 tablet (30 mg total) by mouth 3 (three) times daily.   metoprolol succinate (TOPROL-XL) 100 MG 24 hr tablet Take 1 tablet (100 mg total) by mouth 2 (two) times daily. Take with or immediately following a meal.   montelukast (SINGULAIR) 10 MG tablet Take 1 tablet (10 mg total) by mouth at bedtime.   paliperidone (INVEGA) 6 MG 24 hr tablet Take 6 mg by mouth at bedtime.   Pancrelipase, Lip-Prot-Amyl, (ZENPEP) 40000-126000 units CPEP Take 1 tablet by mouth with breakfast, with lunch, and with evening meal.   pantoprazole (PROTONIX) 40 MG tablet TAKE 1 TABLET(40 MG) BY MOUTH TWICE DAILY (Patient taking differently: Take 40 mg by mouth 2 (two) times daily.)   Spacer/Aero-Holding Rudean Curt Use as directed with inhaler.   sucralfate (CARAFATE) 1 GM/10ML suspension Take 10 mLs (1 g total) by mouth 4 (four) times daily -  with meals and at bedtime. (Patient taking differently: Take 1 g by mouth daily as needed (for ulcers).)   ticagrelor (BRILINTA) 90 MG TABS tablet Take 1 tablet (90 mg total) by mouth 2 (two) times daily.    Family History    Family History  Problem Relation Age of Onset   High blood pressure Mother    Alzheimer's disease Father    Heart attack Brother    Colon cancer Neg Hx    Esophageal cancer Neg Hx    Rectal cancer Neg Hx    Stomach cancer Neg Hx    He indicated that his  mother is deceased. He indicated that his father is deceased. He indicated that only one of his two brothers is alive. He indicated that his maternal grandmother is deceased. He indicated that his maternal grandfather is deceased. He indicated that his paternal grandmother  is deceased. He indicated that his paternal grandfather is deceased. He indicated that the status of his neg hx is unknown.   Social History    Social History   Socioeconomic History   Marital status: Married    Spouse name: Not on file   Number of children: 1   Years of education: 12   Highest education level: High school graduate  Occupational History   Occupation: Retired  Tobacco Use   Smoking status: Former    Packs/day: 1.00    Years: 30.00    Total pack years: 30.00    Types: Cigarettes    Quit date: 05/08/2008    Years since quitting: 14.0   Smokeless tobacco: Never  Vaping Use   Vaping Use: Never used  Substance and Sexual Activity   Alcohol use: No   Drug use: No   Sexual activity: Never  Other Topics Concern   Not on file  Social History Narrative   Lives at home with wife.   Right-handed.   One cup caffeine per day.   Social Determinants of Health   Financial Resource Strain: Not on file  Food Insecurity: Not on file  Transportation Needs: Not on file  Physical Activity: Not on file  Stress: Not on file  Social Connections: Not on file  Intimate Partner Violence: Not on file     Review of Systems    General: No chills, fever, night sweats or weight changes.  Cardiovascular:  No chest pain, dyspnea on exertion, orthopnea, palpitations, paroxysmal nocturnal dyspnea. Mild ankle edema.  Dermatological: No rash, lesions/masses Respiratory: No cough, dyspnea Urologic: No hematuria, dysuria Abdominal:   No nausea, vomiting, diarrhea, bright red blood per rectum, melena, or hematemesis Neurologic:  No visual changes, weakness, changes in mental status. All other systems reviewed and are  otherwise negative except as noted above.  Physical Exam    VS:  BP 124/86 (BP Location: Left Arm, Patient Position: Sitting, Cuff Size: Large)   Pulse 66   Ht 6\' 1"  (1.854 m)   Wt 199 lb 6.4 oz (90.4 kg)   SpO2 99%   BMI 26.31 kg/m  , BMI Body mass index is 26.31 kg/m. GEN:  Well nourished, well developed, in no acute distress. HEENT: Normal. Neck: Supple, no JVD, carotid bruits, or masses. Cardiac: RRR, no murmurs, rubs, or gallops. No clubbing, cyanosis. Trace edema bilateral ankles. Radials/DP/PT 2+ and equal bilaterally.  Respiratory:  Respirations regular and unlabored, clear to auscultation bilaterally. GI: Soft, nontender, nondistended. MS: No deformity or atrophy. Skin: Warm and dry, no rash. Neuro: Strength and sensation are intact. Psych: Normal affect.  Accessory Clinical Findings   Recent Labs: 09/28/2021: ALT 14 05/26/2022: B Natriuretic Peptide 62.9 06/01/2022: BUN 28; Creatinine, Ser 2.24; Hemoglobin 12.8; Platelets 196; Potassium 4.3; Sodium 135   Recent Lipid Panel    Component Value Date/Time   CHOL 149 09/20/2020 0943   TRIG 119 09/20/2020 0943   HDL 26 (L) 09/20/2020 0943   CHOLHDL 5.7 (H) 09/20/2020 0943   CHOLHDL 6.8 11/20/2018 0432   VLDL 19 11/20/2018 0432   LDLCALC 101 (H) 09/20/2020 0943    ECG personally reviewed by me today: Atrial pacemaker, rate 66 bpm, LBBB.  No significant changes from 06/01/2022.   Assessment & Plan   CAD/ischemic cardiomyopathy.  LHC 05/26/2022 PCI with DES to proximal LAD.  Echo 05/26/2022 EF 40 to 45%, Grade I DD.  Patient return to the ED 06/01/2022 for progressively worsening chest pain.  Troponin decreased  from 74 to 63.  This was not felt to be ACS or PE so patient was discharged home.  Patient feels this episode was related to eating a cheeseburger and cookies. He denies further episodes of chest pain, tightness or pressure. He has been returning to his normal activities around his home without pain or shortness of  breath. He is instructed to weigh daily. Continue aspirin, Brilinta, metoprolol, isosorbide, prn SL NTG. He is not taking a statin secondary to a "severe reaction" in the past. Will refer to Pharm D lipid clinic.    Cardiac Rehabilitation Eligibility Assessment  The patient is ready to start cardiac rehabilitation from a cardiac standpoint.   LBBB/sinus node dysfunction.  S/p PPM October 2018.  Remote device check 03/26/2022 showed normal device function with stable leads and battery.  Patient denies dizziness, lightheadedness, presyncope or syncope. Continue device checks and follow-ups with EP.  Hypertension.  BP today 124/86. Patient denies headaches or dizziness.  Continue clonidine, hydralazine, metoprolol. Hyperlipidemia.  LDL May 2022 101, not at goal.  LPa January 2024 92.2. Patient reports prior reaction to statins (see #1).  Refer to Pharm.D. lipid clinic. PAF/a flutter.  Patient denies palpitations. He has a regular rate and rhythm today. Patient not on anticoagulation secondary to history of GI bleed. AAA.  CT June 2022 showed 3.2 cm infrarenal aortic aneurysm.  Patient was evaluated by vascular surgery in August 2022.  Due for repeat ultrasound August 2024.  Continue to follow-up with vascular surgery.  Disposition: Referral to pharm D lipid clinic. Return in 3 months or sooner as needed.    Etta Grandchild. Consandra Laske, DNP, NP-C     06/06/2022, 11:00 AM [facility] [address] Office [phone] Fax [phone]

## 2022-06-06 ENCOUNTER — Ambulatory Visit: Payer: 59 | Attending: General Practice | Admitting: Student

## 2022-06-06 ENCOUNTER — Encounter: Payer: Self-pay | Admitting: General Practice

## 2022-06-06 VITALS — BP 124/86 | HR 66 | Ht 73.0 in | Wt 199.4 lb

## 2022-06-06 DIAGNOSIS — I2510 Atherosclerotic heart disease of native coronary artery without angina pectoris: Secondary | ICD-10-CM

## 2022-06-06 DIAGNOSIS — E782 Mixed hyperlipidemia: Secondary | ICD-10-CM | POA: Diagnosis not present

## 2022-06-06 DIAGNOSIS — I4892 Unspecified atrial flutter: Secondary | ICD-10-CM

## 2022-06-06 DIAGNOSIS — I7143 Infrarenal abdominal aortic aneurysm, without rupture: Secondary | ICD-10-CM

## 2022-06-06 DIAGNOSIS — I480 Paroxysmal atrial fibrillation: Secondary | ICD-10-CM | POA: Diagnosis not present

## 2022-06-06 DIAGNOSIS — I447 Left bundle-branch block, unspecified: Secondary | ICD-10-CM

## 2022-06-06 DIAGNOSIS — I255 Ischemic cardiomyopathy: Secondary | ICD-10-CM

## 2022-06-06 DIAGNOSIS — I10 Essential (primary) hypertension: Secondary | ICD-10-CM

## 2022-06-06 NOTE — Patient Instructions (Addendum)
Medication Instructions:  The current medical regimen is effective;  continue present plan and medications as directed. Please refer to the Current Medication list given to you today.  *If you need a refill on your cardiac medications before your next appointment, please call your pharmacy*  Lab Work: NONE If you have labs (blood work) drawn today and your tests are completely normal, you will receive your results only by: [facility] (if you have MyChart) OR A paper copy in the mail If you have any lab test that is abnormal or we need to change your treatment, we will call you to review the results.  Other Instructions PLEASE READ AND FOLLOW ATTACHED  SALTY 6  REFER TO PHARMD-LIPID  Please try to avoid these triggers: Do not use any products that have nicotine or tobacco in them. These include cigarettes, e-cigarettes, and chewing tobacco. If you need help quitting, ask your doctor. Eat heart-healthy foods. Talk with your doctor about the right eating plan for you. Exercise regularly as told by your doctor. Stay hydrated Do not drink alcohol, Caffeine or chocolate. Lose weight if you are overweight. Do not use drugs, including cannabis  Follow-Up: At [facility], you and your health needs are our priority.  As part of our continuing mission to provide you with exceptional heart care, we have created designated Provider Care Teams.  These Care Teams include your primary Cardiologist (physician) and Advanced Practice Providers (APPs -  Physician Assistants and Nurse Practitioners) who all work together to provide you with the care you need, when you need it.  Your next appointment:   3 month(s)  Provider:   Evalina Field, MD  or Coletta Memos, FNP or [name], NP

## 2022-06-26 ENCOUNTER — Ambulatory Visit: Payer: 59

## 2022-06-26 DIAGNOSIS — R55 Syncope and collapse: Secondary | ICD-10-CM | POA: Diagnosis not present

## 2022-06-26 LAB — CUP PACEART REMOTE DEVICE CHECK
Battery Remaining Longevity: 95 mo
Battery Voltage: 2.99 V
Brady Statistic AP VP Percent: 0.68 %
Brady Statistic AP VS Percent: 98.45 %
Brady Statistic AS VP Percent: 0 %
Brady Statistic AS VS Percent: 0.87 %
Brady Statistic RA Percent Paced: 99.14 %
Brady Statistic RV Percent Paced: 0.68 %
Date Time Interrogation Session: 20240218224355
Implantable Lead Connection Status: 753985
Implantable Lead Connection Status: 753985
Implantable Lead Implant Date: 20181022
Implantable Lead Implant Date: 20181022
Implantable Lead Location: 753859
Implantable Lead Location: 753860
Implantable Lead Model: 3830
Implantable Lead Model: 5076
Implantable Pulse Generator Implant Date: 20181022
Lead Channel Impedance Value: 323 Ohm
Lead Channel Impedance Value: 342 Ohm
Lead Channel Impedance Value: 475 Ohm
Lead Channel Impedance Value: 494 Ohm
Lead Channel Pacing Threshold Amplitude: 0.75 V
Lead Channel Pacing Threshold Amplitude: 1.125 V
Lead Channel Pacing Threshold Pulse Width: 0.4 ms
Lead Channel Pacing Threshold Pulse Width: 0.4 ms
Lead Channel Sensing Intrinsic Amplitude: 10 mV
Lead Channel Sensing Intrinsic Amplitude: 10 mV
Lead Channel Sensing Intrinsic Amplitude: 4.5 mV
Lead Channel Sensing Intrinsic Amplitude: 4.5 mV
Lead Channel Setting Pacing Amplitude: 1.5 V
Lead Channel Setting Pacing Amplitude: 2.5 V
Lead Channel Setting Pacing Pulse Width: 1 ms
Lead Channel Setting Sensing Sensitivity: 1.2 mV
Zone Setting Status: 755011
Zone Setting Status: 755011

## 2022-06-27 ENCOUNTER — Encounter: Payer: Self-pay | Admitting: Pharmacist

## 2022-06-27 ENCOUNTER — Telehealth: Payer: Self-pay | Admitting: Pharmacist

## 2022-06-27 ENCOUNTER — Ambulatory Visit: Payer: 59 | Attending: Cardiology | Admitting: Pharmacist

## 2022-06-27 VITALS — BP 146/82 | HR 83

## 2022-06-27 DIAGNOSIS — T466X5A Adverse effect of antihyperlipidemic and antiarteriosclerotic drugs, initial encounter: Secondary | ICD-10-CM | POA: Diagnosis not present

## 2022-06-27 DIAGNOSIS — M791 Myalgia, unspecified site: Secondary | ICD-10-CM | POA: Diagnosis not present

## 2022-06-27 DIAGNOSIS — E785 Hyperlipidemia, unspecified: Secondary | ICD-10-CM

## 2022-06-27 DIAGNOSIS — I2510 Atherosclerotic heart disease of native coronary artery without angina pectoris: Secondary | ICD-10-CM | POA: Diagnosis not present

## 2022-06-27 NOTE — Progress Notes (Unsigned)
Patient ID: DAR BAILE                 DOB: [birth year]                    MRN: [id number]     HPI: Peter Marsh is a 62 y.o. male patient referred to lipid clinic by [name]. PMH is significant for CKD, HTN, NSTEMI, a fluter, CAD, schizophrenia, and ileus.  Patient reports statin intolerance.  Patient presents today with wife. Concerned regarding his CAD and his HTN which he reports is elevated in the morning and in the middle of the night.  Has CKD which he says is due to being prescribed Nexium 20 years ago and has a lawsuit against [employer] pending.  Has tried atorvastatin and rosuvastatin and had side effects.   Is not sleeping well. Reports anxiety, stress, and pain.  Was drinking 4 liters of mountain Dew a day until his NSTEMI. Has reduced now and drinking mostly water and sugar free Kool aid.  Current Medications: N/A  Intolerances:  Rosuvastatin Atorvastatin  Risk Factors:  Hx of NSTEMI Hx of smoking  LDL goal: <55  Labs: LPA 92.2, TC 133, Trigs 130, HDL 24, LDL 85 (05/10/21)   Past Medical History:  Diagnosis Date   Allergy    Anxiety    Asthma    uses inhalers    Bilateral carotid bruits    Cardiac conduction disorder 2018   s/p MDT PPM   Cataract    Chronic kidney disease    bladder interstial cystitis    Chronic kidney disease (CKD), stage IV (severe) ([name])    followed by Dr. Joelyn Oms at [facility]   COPD (chronic obstructive pulmonary disease) ([place])    Coronary artery disease    Depression    Diabetes mellitus without complication ([place])    Encounter for care of pacemaker 02/13/2019   GERD (gastroesophageal reflux disease)    History of stomach ulcers 2001   Hypertension    LBBB (left bundle branch block)    Lower extremity edema    Mild intermittent asthma without complication    Mixed hyperlipidemia    Mobitz type 2 second degree AV block 04/06/2019   Neuropathy    Pacemaker: Medtronic Azure XT DR MRI [id number] PPM -  BUNDLE  OF HIS pacing  02/26/2017   Scheduled Remote pacemaker check  11/12/2018:  There were 24 Fast AV episodes:  EGMs show SVTs. Episodes lasted < 2 minutes. Health trends do not demonstrate significant abnormality. Battery longevity is 9.4 - 10.3 years. RA pacing is 47.1 %, RV pacing is 40.4 %.  Clinic check 11/07/17.    Paroxysmal atrial flutter ([name])    PONV (postoperative nausea and vomiting)    Prostatitis    Recurrent upper respiratory infection (URI)    Schizophrenia ([name])    Sinus node dysfunction ([place]) 02/13/2019   Urticaria     Current Outpatient Medications on File Prior to Visit  Medication Sig Dispense Refill   acetaminophen (TYLENOL) 325 MG tablet Take 650 mg by mouth every 6 (six) hours as needed for mild pain or headache.     albuterol (VENTOLIN HFA) 108 (90 Base) MCG/ACT inhaler INHALE 2 PUFFS INTO THE LUNGS EVERY 6 HOURS AS NEEDED FOR WHEEZING OR SHORTNESS OF BREATH (Patient not taking: Reported on 06/06/2022) 18 g 0   aspirin 81 MG chewable tablet Chew 1 tablet (81 mg total) by mouth daily. 30 tablet 3  Budeson-Glycopyrrol-Formoterol (BREZTRI AEROSPHERE) 160-9-4.8 MCG/ACT AERO Inhale 2 puffs into the lungs in the morning and at bedtime. 10.7 g 5   buPROPion (WELLBUTRIN SR) 150 MG 12 hr tablet Take 150 mg by mouth 2 (two) times daily.     cetirizine (ZYRTEC) 10 MG tablet Take 1 tablet (10 mg total) by mouth daily. 30 tablet 5   cloNIDine (CATAPRES) 0.2 MG tablet Take 1 tablet (0.2 mg total) by mouth 3 (three) times daily. 270 tablet 3   docusate sodium (COLACE) 100 MG capsule Take 1 capsule (100 mg total) by mouth 2 (two) times daily. 60 capsule 0   fluticasone (FLONASE) 50 MCG/ACT nasal spray Place 1 spray into both nostrils daily as needed for allergies. (Patient not taking: Reported on 06/06/2022) 11.1 mL 3   gabapentin (NEURONTIN) 100 MG capsule Take 1 capsule (100 mg total) by mouth 3 (three) times daily. (Patient not taking: Reported on 06/06/2022) 90 capsule 11   hydrALAZINE  (APRESOLINE) 50 MG tablet Take 1 tablet (50 mg total) by mouth 3 (three) times daily. 270 tablet 1   isosorbide dinitrate (ISORDIL) 30 MG tablet Take 1 tablet (30 mg total) by mouth 3 (three) times daily. 90 tablet 1   metoprolol succinate (TOPROL-XL) 100 MG 24 hr tablet Take 1 tablet (100 mg total) by mouth 2 (two) times daily. Take with or immediately following a meal. 60 tablet 2   montelukast (SINGULAIR) 10 MG tablet Take 1 tablet (10 mg total) by mouth at bedtime. 90 tablet 1   nitroGLYCERIN (NITROSTAT) 0.4 MG SL tablet Place 0.4 mg under the tongue every 5 (five) minutes as needed for chest pain.     ondansetron (ZOFRAN ODT) 4 MG disintegrating tablet Take 1 tablet (4 mg total) by mouth every 8 (eight) hours as needed for nausea or vomiting. (Patient not taking: Reported on 06/06/2022) 10 tablet 0   paliperidone (INVEGA) 6 MG 24 hr tablet Take 6 mg by mouth at bedtime.     Pancrelipase, Lip-Prot-Amyl, (ZENPEP) 40000-126000 units CPEP Take 1 tablet by mouth with breakfast, with lunch, and with evening meal. 90 capsule 2   pantoprazole (PROTONIX) 40 MG tablet TAKE 1 TABLET(40 MG) BY MOUTH TWICE DAILY (Patient taking differently: Take 40 mg by mouth 2 (two) times daily.) 60 tablet 5   Spacer/Aero-Holding [facility] Use as directed with inhaler. 1 each 0   sucralfate (CARAFATE) 1 GM/10ML suspension Take 10 mLs (1 g total) by mouth 4 (four) times daily -  with meals and at bedtime. (Patient taking differently: Take 1 g by mouth daily as needed (for ulcers).) 3600 mL 3   ticagrelor (BRILINTA) 90 MG TABS tablet Take 1 tablet (90 mg total) by mouth 2 (two) times daily. 60 tablet 2   Current Facility-Administered Medications on File Prior to Visit  Medication Dose Route Frequency Provider Last Rate Last Admin   acetaminophen (OFIRMEV) IVPB    PRN Lissa Morales, CRNA   1,000 mg at 04/19/11 0910   glycopyrrolate (ROBINUL) injection    PRN Lissa Morales, CRNA   0.8 mg at 04/19/11 1037    Allergies   Allergen Reactions   Methylpyrrolidone Hives    froze the intestine   Niacin Itching, Nausea And Vomiting and Other (See Comments)    Flushing, itching, tingling    Ace Inhibitors     Other reaction(s): CKD 4   Norvasc [Amlodipine Besylate] Other (See Comments)    Swollen Feet   Other Other (See Comments)  Oxybutynin Chloride [Oxybutynin Chloride Er] Other (See Comments)    froze the intestine   Statins     Reports severe reaction but cannot remember exactly what it was    Vesicare [Solifenacin Succinate] Other (See Comments)    Froze the intestine    Ciprofloxacin Rash and Other (See Comments)    Felt flushed    Oxybutynin Rash and Other (See Comments)    bowel obst   Solifenacin Rash    Assessment/Plan:  1. Hyperlipidemia - Patient LDL 85 which is above goal of <55. Aggressive goal selected due to recent NSTEMI. Since patient intolerant to statins, recommend starting PCSK9i.  Using demo pen, educated patient on mechanism of action, storage, site selection, administration, and possible adverse effects. Patient able to demonstrate in room. Will complete PA and contact patient when approved. Recheck lipid panel in 2-3 months.  Patient concerned regarding his BP which he says is elevated in the middle of the night and the morning. Advised to reach out to [facility] physician regarding timing of his paliperidone administration since he reports sleep disruptions. Gave patient BP logs and scheduled f/u for 4 weeks

## 2022-06-27 NOTE — Patient Instructions (Signed)
It was nice meeting you two today  We would like your LDL to be less than 55  We would like to start a new medication called Repatha which is a new medication you would take every 2 weeks  I will complete the prior authorization for you and contact you when it is approved  Once you start the medication we will recheck your cholesterol in 2-3 months  Please message with any questions  Karren Cobble, PharmD, [place], [place], [place], [place], [place], [address] Phone: [phone], Fax: [phone]

## 2022-06-28 ENCOUNTER — Telehealth: Payer: Self-pay

## 2022-06-28 ENCOUNTER — Other Ambulatory Visit (HOSPITAL_COMMUNITY): Payer: Self-pay

## 2022-06-28 DIAGNOSIS — I214 Non-ST elevation (NSTEMI) myocardial infarction: Secondary | ICD-10-CM

## 2022-06-28 DIAGNOSIS — I2510 Atherosclerotic heart disease of native coronary artery without angina pectoris: Secondary | ICD-10-CM

## 2022-06-28 NOTE — Telephone Encounter (Signed)
Pharmacy Patient Advocate Encounter   Received notification from [employer] that prior authorization for REPATHA 140 MG/ML INJ is needed.    PA submitted on 06/28/22 [place] [id number] Status is pending  Karie Soda, [place] Patient Advocate Specialist Direct Number: [phone] Fax: [phone]

## 2022-06-29 ENCOUNTER — Telehealth (HOSPITAL_COMMUNITY): Payer: Self-pay

## 2022-06-29 MED ORDER — REPATHA SURECLICK 140 MG/ML ~~LOC~~ SOAJ: 1.0000 mL | SUBCUTANEOUS | 3 refills | Status: DC

## 2022-06-29 NOTE — Telephone Encounter (Signed)
Pharmacy Patient Advocate Encounter  Prior Authorization for REPATHA 140 MG/ML INJ has been approved.    PA# [id number] Effective dates: 06/28/22 through 12/27/22  Karie Soda, [place] Patient Advocate Specialist Direct Number: [phone] Fax: [phone]

## 2022-06-29 NOTE — Telephone Encounter (Signed)
Attempted to call patient in regards to Cardiac Rehab - LM on VM  Mailed letter 

## 2022-06-29 NOTE — Addendum Note (Signed)
Addended by: Rollen Sox on: 06/29/2022 04:41 PM   Modules accepted: Orders

## 2022-06-29 NOTE — Telephone Encounter (Signed)
Called and spoke with pt in regards to CR, pt stated he is moving to [place] soon and is not interested in participate at this time.   Closed referral

## 2022-07-28 ENCOUNTER — Ambulatory Visit: Payer: 59 | Attending: Cardiology | Admitting: Student

## 2022-07-28 ENCOUNTER — Encounter: Payer: Self-pay | Admitting: Student

## 2022-07-28 VITALS — BP 145/89 | HR 64

## 2022-07-28 DIAGNOSIS — E7849 Other hyperlipidemia: Secondary | ICD-10-CM | POA: Diagnosis not present

## 2022-07-28 DIAGNOSIS — I10 Essential (primary) hypertension: Secondary | ICD-10-CM | POA: Diagnosis not present

## 2022-07-28 MED ORDER — HYDRALAZINE HCL 100 MG PO TABS: 100.0000 mg | ORAL_TABLET | Freq: Two times a day (BID) | ORAL | 3 refills | Status: DC

## 2022-07-28 MED ORDER — EZETIMIBE 10 MG PO TABS: 10.0000 mg | ORAL_TABLET | Freq: Every day | ORAL | 3 refills | Status: AC

## 2022-07-28 NOTE — Progress Notes (Signed)
Patient ID: Peter Marsh                 DOB: May 23, 1960                      MRN: [id number]      HPI: Peter Marsh is a 62 y.o. male referred by Dr. Audie Box to HTN clinic. PMH is significant for CAD s/p NSTEMI PCI with DES proximal LAD, LBBB/sinus node dysfunction s/p PPM 2018, PAF/a flutter not on anticoagulation secondary to GI bleed history, infrarenal abdominal aortic aneurysm, hypertension, hyperlipidemia, CKD stage IIIb, COPD, schizophrenia.  Patient saw McCoy ,PA on 06/13/2022 BP was 188/118. Losartan 100 mg was initiated   Patient presented today for HTN clinic. Patient reports he stopped taking Repatha he has tried injecting but he could not do it so prefres something oral or injection that is done by provider. He has been taking Ezetimibe 10 mg but his new pharmacy does not has the prescription so wants [place] to send prescription to [place]. He checks his BP at home it ~127-130/80-85 range with HR around 60-70.his BP arm cuff is never validated  He is stress today as his car broke. He was prescribed losartan but he is get hot flashes from it so had stopped taking it but he takes clonidine and hydralazine three times daily and metoprolol once daily without forgetting. 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 Has lost significant weight over past couple years ( was 300 lbs now 180 lbs)   Current HTN meds:  Clonidine 0.2 mg three times daily  Hydralazine 50 mg three times daily Metoprolol XL100 mg daily  Previously tried:  BP goal: <130/80  Family History:  Father: alzheimer Father side Uncles: heart attack  Mother: diabetes    Social History:  Alcohol: none Smoking: quit 15 years ago   Diet: low salt diet   Exercise: will be joining [employer], [employer]    CrCl : 40 mL/min ( adjusted BW)   Wt Readings from Last 3 Encounters:  06/06/22 199 lb 6.4 oz (90.4 kg)  06/01/22 203 lb (92.1 kg)  05/26/22 200 lb (90.7 kg)   BP Readings from Last 3 Encounters:  07/28/22  (!) 145/89  06/27/22 (!) 146/82  06/06/22 124/86   Pulse Readings from Last 3 Encounters:  07/28/22 64  06/27/22 83  06/06/22 66    Renal function: CrCl cannot be calculated (Patient's most recent lab result is older than the maximum 21 days allowed.).  Past Medical History:  Diagnosis Date   Allergy    Anxiety    Asthma    uses inhalers    Bilateral carotid bruits    Cardiac conduction disorder 2018   s/p MDT PPM   Cataract    Chronic kidney disease    bladder interstial cystitis    Chronic kidney disease (CKD), stage IV (severe) ([name])    followed by Dr. Joelyn Oms at [facility]   COPD (chronic obstructive pulmonary disease) ([place])    Coronary artery disease    Depression    Diabetes mellitus without complication ([place])    Encounter for care of pacemaker 02/13/2019   GERD (gastroesophageal reflux disease)    History of stomach ulcers 2001   Hypertension    LBBB (left bundle branch block)    Lower extremity edema    Mild intermittent asthma without complication    Mixed hyperlipidemia    Mobitz type 2 second degree AV block 04/06/2019  Neuropathy    Pacemaker: Medtronic Azure XT DR MRI [id number] PPM -  BUNDLE OF HIS pacing  02/26/2017   Scheduled Remote pacemaker check  11/12/2018:  There were 24 Fast AV episodes:  EGMs show SVTs. Episodes lasted < 2 minutes. Health trends do not demonstrate significant abnormality. Battery longevity is 9.4 - 10.3 years. RA pacing is 47.1 %, RV pacing is 40.4 %.  Clinic check 11/07/17.    Paroxysmal atrial flutter ([name])    PONV (postoperative nausea and vomiting)    Prostatitis    Recurrent upper respiratory infection (URI)    Schizophrenia ([name])    Sinus node dysfunction ([place]) 02/13/2019   Urticaria     Current Outpatient Medications on File Prior to Visit  Medication Sig Dispense Refill   acetaminophen (TYLENOL) 325 MG tablet Take 650 mg by mouth every 6 (six) hours as needed for mild pain or headache.     albuterol (VENTOLIN  HFA) 108 (90 Base) MCG/ACT inhaler INHALE 2 PUFFS INTO THE LUNGS EVERY 6 HOURS AS NEEDED FOR WHEEZING OR SHORTNESS OF BREATH (Patient not taking: Reported on 06/06/2022) 18 g 0   aspirin 81 MG chewable tablet Chew 1 tablet (81 mg total) by mouth daily. 30 tablet 3   Budeson-Glycopyrrol-Formoterol (BREZTRI AEROSPHERE) 160-9-4.8 MCG/ACT AERO Inhale 2 puffs into the lungs in the morning and at bedtime. 10.7 g 5   buPROPion (WELLBUTRIN SR) 150 MG 12 hr tablet Take 150 mg by mouth 2 (two) times daily.     cetirizine (ZYRTEC) 10 MG tablet Take 1 tablet (10 mg total) by mouth daily. 30 tablet 5   cloNIDine (CATAPRES) 0.2 MG tablet Take 1 tablet (0.2 mg total) by mouth 3 (three) times daily. 270 tablet 3   docusate sodium (COLACE) 100 MG capsule Take 1 capsule (100 mg total) by mouth 2 (two) times daily. 60 capsule 0   Evolocumab (REPATHA SURECLICK) [id number] MG/ML SOAJ Inject 140 mg into the skin every 14 (fourteen) days. 6 mL 3   fluticasone (FLONASE) 50 MCG/ACT nasal spray Place 1 spray into both nostrils daily as needed for allergies. (Patient not taking: Reported on 06/06/2022) 11.1 mL 3   gabapentin (NEURONTIN) 100 MG capsule Take 1 capsule (100 mg total) by mouth 3 (three) times daily. (Patient not taking: Reported on 06/06/2022) 90 capsule 11   isosorbide dinitrate (ISORDIL) 30 MG tablet Take 1 tablet (30 mg total) by mouth 3 (three) times daily. 90 tablet 1   metoprolol succinate (TOPROL-XL) 100 MG 24 hr tablet Take 1 tablet (100 mg total) by mouth 2 (two) times daily. Take with or immediately following a meal. 60 tablet 2   montelukast (SINGULAIR) 10 MG tablet Take 1 tablet (10 mg total) by mouth at bedtime. 90 tablet 1   nitroGLYCERIN (NITROSTAT) 0.4 MG SL tablet Place 0.4 mg under the tongue every 5 (five) minutes as needed for chest pain.     ondansetron (ZOFRAN ODT) 4 MG disintegrating tablet Take 1 tablet (4 mg total) by mouth every 8 (eight) hours as needed for nausea or vomiting. (Patient not taking:  Reported on 06/06/2022) 10 tablet 0   paliperidone (INVEGA) 6 MG 24 hr tablet Take 6 mg by mouth at bedtime.     Pancrelipase, Lip-Prot-Amyl, (ZENPEP) 40000-126000 units CPEP Take 1 tablet by mouth with breakfast, with lunch, and with evening meal. 90 capsule 2   pantoprazole (PROTONIX) 40 MG tablet TAKE 1 TABLET(40 MG) BY MOUTH TWICE DAILY (Patient taking differently: Take 40 mg by mouth  2 (two) times daily.) 60 tablet 5   Spacer/Aero-Holding [facility] Use as directed with inhaler. 1 each 0   sucralfate (CARAFATE) 1 GM/10ML suspension Take 10 mLs (1 g total) by mouth 4 (four) times daily -  with meals and at bedtime. (Patient taking differently: Take 1 g by mouth daily as needed (for ulcers).) 3600 mL 3   ticagrelor (BRILINTA) 90 MG TABS tablet Take 1 tablet (90 mg total) by mouth 2 (two) times daily. 60 tablet 2   Current Facility-Administered Medications on File Prior to Visit  Medication Dose Route Frequency Provider Last Rate Last Admin   acetaminophen (OFIRMEV) IVPB    PRN Lissa Morales, CRNA   1,000 mg at 04/19/11 0910   glycopyrrolate (ROBINUL) injection    PRN Lissa Morales, CRNA   0.8 mg at 04/19/11 1037    Allergies  Allergen Reactions   Methylpyrrolidone Hives    froze the intestine   Niacin Itching, Nausea And Vomiting and Other (See Comments)    Flushing, itching, tingling    Ace Inhibitors     Other reaction(s): CKD 4   Norvasc [Amlodipine Besylate] Other (See Comments)    Swollen Feet   Other Other (See Comments)   Oxybutynin Chloride [Oxybutynin Chloride Er] Other (See Comments)    froze the intestine   Statins     Reports severe reaction but cannot remember exactly what it was    Vesicare [Solifenacin Succinate] Other (See Comments)    Froze the intestine    Ciprofloxacin Rash and Other (See Comments)    Felt flushed    Oxybutynin Rash and Other (See Comments)    bowel obst   Solifenacin Rash    Blood pressure (!) 145/89, pulse 64, SpO2 97  %.   Assessment/Plan:  1. Hypertension -  Essential hypertension Assessment: BP is uncontrolled in office BP 166/99 mmHg  2nd reading 145/89 heart rate 64  Takes current BP medications regularly and tolerates them well without any side effects Denies SOB, palpitation, chest pain, headaches,or swelling Could not take losartan or any ACE or ARBs it gives him hot flashes ( also has reduced renal function) Reiterated the importance of regular exercise and low salt diet   Plan:  Increase dose of hydralazine from 50 mg three times daily to 100 mg three times daily  Continue taking clonidine 0.2 mg three times daily and metoprolol XL 100 mg daily  Patient to bring home BP monitor for validation at the next OV  Patient to keep record of BP readings with heart rate and report to [place] at the next visit Patient to see PharmD in 4 weeks for follow up  Follow up lab(s) : none    Hyperlipidemia Assessment/ plan:  Patient was prescribed Repatha due to statins intolerance Can't do self injections - tried couple times and stopped taking it  Patient re-started taking Zetia 10 mg daily Given hx of MI and Stroke inclisiran is reasonable option for secondary prevention  patient is in agreement since it does not involve self injection step  Patient has medicare dual coverage will send Leqvio start form to determine insurance  coverage       Thank you  Cammy Copa, Pharm.D [facility] [address] [place], [place] [address]  Phone: [phone]; Fax: [phone]

## 2022-07-28 NOTE — Patient Instructions (Signed)
Changes made by your pharmacist Cammy Copa, PharmD at today's visit:    Instructions/Changes  (what do you need to do) Your Notes  (what you did and when you did it)  1.continue taking clonidine 0.2 mg three times daily and metoprolol XL 100 mg daily    2. Increase hydralazine from 50 mg three times daily to 100 mg three times daily     Bring all of your meds, your BP cuff and your record of home blood pressures to your next appointment.    HOW TO TAKE YOUR BLOOD PRESSURE AT HOME  Rest 5 minutes before taking your blood pressure.  Don't smoke or drink caffeinated beverages for at least 30 minutes before. Take your blood pressure before (not after) you eat. Sit comfortably with your back supported and both feet on the floor (don't cross your legs). Elevate your arm to heart level on a table or a desk. Use the proper sized cuff. It should fit smoothly and snugly around your bare upper arm. There should be enough room to slip a fingertip under the cuff. The bottom edge of the cuff should be 1 inch above the crease of the elbow. Ideally, take 3 measurements at one sitting and record the average.  Important lifestyle changes to control high blood pressure  Intervention  Effect on the BP  Lose extra pounds and watch your waistline Weight loss is one of the most effective lifestyle changes for controlling blood pressure. If you're overweight or obese, losing even a small amount of weight can help reduce blood pressure. Blood pressure might go down by about 1 millimeter of mercury (mm Hg) with each kilogram (about 2.2 pounds) of weight lost.  Exercise regularly As a general goal, aim for at least 30 minutes of moderate physical activity every day. Regular physical activity can lower high blood pressure by about 5 to 8 mm Hg.  Eat a healthy diet Eating a diet rich in whole grains, fruits, vegetables, and low-fat dairy products and low in saturated fat and cholesterol. A healthy diet can  lower high blood pressure by up to 11 mm Hg.  Reduce salt (sodium) in your diet Even a small reduction of sodium in the diet can improve heart health and reduce high blood pressure by about 5 to 6 mm Hg.  Limit alcohol One drink equals 12 ounces of beer, 5 ounces of wine, or 1.5 ounces of 80-proof liquor.  Limiting alcohol to less than one drink a day for women or two drinks a day for men can help lower blood pressure by about 4 mm Hg.   If you have any questions or concerns please use My Chart to send questions or call the office at [phone]

## 2022-07-28 NOTE — Assessment & Plan Note (Addendum)
Assessment/ plan:  Patient was prescribed Repatha due to statins intolerance Can't do self injections - tried couple times and stopped taking it  Patient re-started taking Zetia 10 mg daily Given hx of MI and Stroke(2021) inclisiran is reasonable option for secondary prevention  patient is in agreement since it does not involve self injection step  Patient has medicare dual coverage will send Leqvio start form to determine insurance  coverage

## 2022-07-28 NOTE — Assessment & Plan Note (Signed)
Assessment: BP is uncontrolled in office BP 166/99 mmHg  2nd reading 145/89 heart rate 64  Takes current BP medications regularly and tolerates them well without any side effects Denies SOB, palpitation, chest pain, headaches,or swelling Could not take losartan or any ACE or ARBs it gives him hot flashes ( also has reduced renal function) Reiterated the importance of regular exercise and low salt diet   Plan:  Increase dose of hydralazine from 50 mg three times daily to 100 mg three times daily  Continue taking clonidine 0.2 mg three times daily and metoprolol XL 100 mg daily  Patient to bring home BP monitor for validation at the next OV  Patient to keep record of BP readings with heart rate and report to [place] at the next visit Patient to see PharmD in 4 weeks for follow up  Follow up lab(s) : none

## 2022-08-07 NOTE — Progress Notes (Signed)
Remote pacemaker transmission.   

## 2022-08-14 ENCOUNTER — Emergency Department (HOSPITAL_COMMUNITY)
Admission: EM | Admit: 2022-08-14 | Discharge: 2022-08-14 | Discharge status: Against Medical Advice | Payer: 59 | Attending: Emergency Medicine | Admitting: Emergency Medicine

## 2022-08-14 ENCOUNTER — Other Ambulatory Visit: Payer: Self-pay

## 2022-08-14 DIAGNOSIS — R5383 Other fatigue: Secondary | ICD-10-CM | POA: Diagnosis present

## 2022-08-14 DIAGNOSIS — Z5321 Procedure and treatment not carried out due to patient leaving prior to being seen by health care provider: Secondary | ICD-10-CM | POA: Diagnosis not present

## 2022-08-14 NOTE — ED Triage Notes (Signed)
Pt states he is "so tired"  "tired of everything"  has been reportedly sick, states his wife has left him, and he just doesn't feel well.  Denies SI or HI.

## 2022-08-14 NOTE — ED Notes (Signed)
Pt walked out of triage room and stated "I am going home, is this the way out"  Pt left department independently ambulatory and in NAD

## 2022-08-15 ENCOUNTER — Telehealth: Payer: Self-pay | Admitting: Pharmacist

## 2022-08-15 NOTE — Telephone Encounter (Signed)
Per Leqvio service center patient has 80/20 coverage for Leqvio and his out of pocket max is 3600.00.  Patient could not afford this option. At this point he would like to continue with Ezetimibe and repeat lab in 3 months.   Encourage patient to think about starting Repatha which is already approved by his insurance. Offered to train in person again and do 1st injection in the office. Patient will think about it and will let us know.

## 2022-08-18 ENCOUNTER — Telehealth: Payer: Self-pay | Admitting: Gastroenterology

## 2022-08-18 NOTE — Telephone Encounter (Signed)
Patient advise.  

## 2022-08-18 NOTE — Telephone Encounter (Signed)
Good morning Dr. Myrtie Neither  Supervising Provider 08/18/22 AM  We received a referral for this patient to schedule a colonoscopy. Patient is a previous patient of Dr. Orvan Falconer but due to her leaving would like to be assigned to a different provider. Patient had an initial consultation with [facility] in 01/2022, but canceled his colonoscopy due to issues with traveling. Patient states it is too far and would like to come back to establish with [facility]. Records are in Care Everywhere for your review. Please advise on scheduling.    Thank you.

## 2022-08-18 NOTE — Telephone Encounter (Signed)
This patient has seen 3 different GI practices in this area in the last 3 years [facility], followed by Dr. Orvan Falconer at this practice, followed by Dr.Wray last year).  He was seen by Dr. Jeani Hawking before that.  Multiple changes of GI providers is not optimal for continuity of care.  In addition to that, due to high demand from patients without established GI providers, I  cannot accommodate this request to transfer care back to our practice.  - HD

## 2022-08-21 ENCOUNTER — Telehealth: Payer: Self-pay

## 2022-08-21 DIAGNOSIS — I214 Non-ST elevation (NSTEMI) myocardial infarction: Secondary | ICD-10-CM

## 2022-08-21 MED ORDER — CLOPIDOGREL BISULFATE 75 MG PO TABS: 75.0000 mg | ORAL_TABLET | Freq: Every day | ORAL | 3 refills | Status: DC

## 2022-08-21 NOTE — Telephone Encounter (Signed)
Patient walked in and stated since he been taking brilinta he has been having bruising all over and he knows he is not hitting himself. He states a month ago he woke up and had bruising on his face. He did have small bruising on his left wrist and the back if his elbow. No active bleeding according to patient. He states he sometimes feels hot and cold and feels like brilinta has been causing him to have headaches. He does not currently have headache. He wants to know if you any other options.I will forward to provider for advise.

## 2022-08-21 NOTE — Addendum Note (Signed)
Addended by: Judene Companion on: 08/21/2022 01:58 PM   Modules accepted: Orders

## 2022-08-21 NOTE — Telephone Encounter (Signed)
Patient is aware to stop Brilinta and he will start plavix 75mg  daily tomorrow. Rx sent to pharmacy on file.

## 2022-09-04 ENCOUNTER — Encounter: Payer: Self-pay | Admitting: Pharmacist Clinician (PhC)/ Clinical Pharmacy Specialist

## 2022-09-04 ENCOUNTER — Ambulatory Visit: Payer: 59 | Attending: Cardiology | Admitting: Pharmacist Clinician (PhC)/ Clinical Pharmacy Specialist

## 2022-09-04 VITALS — BP 144/91 | HR 60 | Ht 73.0 in | Wt 185.0 lb

## 2022-09-04 DIAGNOSIS — I10 Essential (primary) hypertension: Secondary | ICD-10-CM | POA: Diagnosis not present

## 2022-09-04 NOTE — Progress Notes (Unsigned)
Cardiology Clinic Note   Patient Name: Peter Marsh Date of Encounter: 09/05/2022  Primary Care Provider:  Tracey Harries, MD Primary Cardiologist:  Reatha Harps, MD  Patient Profile    Peter Marsh 62 year old male presents to the clinic today for follow-up evaluation of his paroxysmal atrial fibrillation and coronary artery disease.  Past Medical History    Past Medical History:  Diagnosis Date   Allergy    Anxiety    Asthma    uses inhalers    Bilateral carotid bruits    Cardiac conduction disorder 2018   s/p MDT PPM   Cataract    Chronic kidney disease    bladder interstial cystitis    Chronic kidney disease (CKD), stage IV (severe) ([name])    followed by Dr. Marisue Humble at [facility]   COPD (chronic obstructive pulmonary disease) ([name])    Coronary artery disease    Depression    Diabetes mellitus without complication ([name])    Encounter for care of pacemaker 02/13/2019   GERD (gastroesophageal reflux disease)    History of stomach ulcers 2001   Hypertension    LBBB (left bundle branch block)    Lower extremity edema    Mild intermittent asthma without complication    Mixed hyperlipidemia    Mobitz type 2 second degree AV block 04/06/2019   Neuropathy    Pacemaker: Medtronic Azure XT DR MRI [id number] PPM -  BUNDLE OF HIS pacing  02/26/2017   Scheduled Remote pacemaker check  11/12/2018:  There were 24 Fast AV episodes:  EGMs show SVTs. Episodes lasted < 2 minutes. Health trends do not demonstrate significant abnormality. Battery longevity is 9.4 - 10.3 years. RA pacing is 47.1 %, RV pacing is 40.4 %.  Clinic check 11/07/17.    Paroxysmal atrial flutter ([name])    PONV (postoperative nausea and vomiting)    Prostatitis    Recurrent upper respiratory infection (URI)    Schizophrenia ([name])    Sinus node dysfunction ([name]) 02/13/2019   Urticaria    Past Surgical History:  Procedure Laterality Date   ADENOIDECTOMY     APPENDECTOMY     COLONOSCOPY      CORONARY STENT INTERVENTION N/A 05/26/2022   Procedure: CORONARY STENT INTERVENTION;  Surgeon: Corky Crafts, MD;  Location: [facility] INVASIVE CV LAB;  Service: Cardiovascular;  Laterality: N/A;   CORONARY ULTRASOUND/IVUS N/A 05/26/2022   Procedure: Intravascular Ultrasound/IVUS;  Surgeon: Corky Crafts, MD;  Location[facility] INVASIVE CV LAB;  Service: Cardiovascular;  Laterality: N/A;   ELECTROPHYSIOLOGY STUDY N/A 02/26/2017   Procedure: ELECTROPHYSIOLOGY STUDY;  Surgeon: Marinus Maw, MD;  Location: [facility] INVASIVE CV LAB;  Service: Cardiovascular;  Laterality: N/A;   ESOPHAGOGASTRODUODENOSCOPY (EGD) WITH PROPOFOL N/A 09/18/2016   Procedure: ESOPHAGOGASTRODUODENOSCOPY (EGD) WITH PROPOFOL;  Surgeon: Kerin Salen, MD;  Location: [facility] ENDOSCOPY;  Service: Gastroenterology;  Laterality: N/A;   LEFT HEART CATH AND CORONARY ANGIOGRAPHY N/A 05/26/2022   Procedure: LEFT HEART CATH AND CORONARY ANGIOGRAPHY;  Surgeon: Corky Crafts, MD;  Location: [facility] INVASIVE CV LAB;  Service: Cardiovascular;  Laterality: N/A;   LUMBAR LAMINECTOMY/DECOMPRESSION MICRODISCECTOMY  04/19/2011   Procedure: LUMBAR LAMINECTOMY/DECOMPRESSION MICRODISCECTOMY;  Surgeon: Jacki Cones;  Location: [facility] ORS;  Service: Orthopedics;  Laterality: Left;  Hemi LAminectomy/Microdiscectomy Lumbar four  - Lumbar five  on the Left (X-Ray)   PACEMAKER IMPLANT N/A 02/26/2017   Procedure: PACEMAKER IMPLANT;  Surgeon: Marinus Maw, MD;  Location: [facility] INVASIVE CV LAB;  Service: Cardiovascular;  Laterality: N/A;   TONSILLECTOMY     UPPER GASTROINTESTINAL ENDOSCOPY      Allergies  Allergies  Allergen Reactions   Methylpyrrolidone Hives    froze the intestine   Niacin Itching, Nausea And Vomiting and Other (See Comments)    Flushing, itching, tingling    Ace Inhibitors     Other reaction(s): CKD 4   Norvasc [Amlodipine Besylate] Other (See Comments)    Swollen Feet   Other Other (See Comments)   Oxybutynin Chloride [Oxybutynin  Chloride Er] Other (See Comments)    froze the intestine   Statins     Reports severe reaction but cannot remember exactly what it was    Vesicare [Solifenacin Succinate] Other (See Comments)    Froze the intestine    Ciprofloxacin Rash and Other (See Comments)    Felt flushed    Oxybutynin Rash and Other (See Comments)    bowel obst   Solifenacin Rash    History of Present Illness    Peter Marsh has a PMH of coronary artery disease, hyperlipidemia, ischemic cardiomyopathy, LBBB, HTN, paroxysmal atrial fibrillation/flutter, PPM (2018), and AAA.  He underwent cardiac catheterization 1/24 with PCI and DES to his proximal LAD.  Echocardiogram 1/24 showed an EF of 40-45%, G1 DD.  He reports severe statin reaction and was referred to the lipid clinic.  He was restarted on ezetimibe 10 mg daily.  He attempted several times to do self injection with Repatha and was unable to take the medication.  He was felt to be a good candidate for inclisiran medication.  However, his insurance would require him to pay $3600 out-of-pocket.  He was encouraged to think about restarting Repatha as it has already been approved by his insurance.  Lipid clinic plan to repeat lab work in 3 months.  (Around 6/24)  He presents to the clinic today for follow-up evaluation and states he has had increased stress related to his housing situation and personal items.  He reports his first wife and daughter dying in a car accident.  We reviewed his coronary artery disease, hyperlipidemia, hypertension, and paroxysmal atrial fibrillation.  He reports that he checked his blood pressure this morning and it was in the 110s over 80s.  He did not take his medication last night however, he did take his medication this morning.  He has been making a increased effort to work on diet and exercise.  I will provide him with a 2-week blood pressure log, batteries for his blood pressure cuff, and plan to follow-up in 2 to 3 months.  Today  he denies chest pain, shortness of breath, lower extremity edema, fatigue, palpitations, melena, hematuria, hemoptysis, diaphoresis, weakness, presyncope, syncope, orthopnea, and PND.   Home Medications    Prior to Admission medications   Medication Sig Start Date End Date Taking? Authorizing Provider  clopidogrel (PLAVIX) 75 MG tablet Take 1 tablet (75 mg total) by mouth daily. 08/21/22 08/21/23  O'NealRonnald Ramp, MD  acetaminophen (TYLENOL) 325 MG tablet Take 650 mg by mouth every 6 (six) hours as needed for mild pain or headache.    [provider]  albuterol (VENTOLIN HFA) 108 (90 Base) MCG/ACT inhaler INHALE 2 PUFFS INTO THE LUNGS EVERY 6 HOURS AS NEEDED FOR WHEEZING OR SHORTNESS OF BREATH Patient not taking: Reported on 06/06/2022 07/11/21   Nehemiah Settle, FNP  aspirin 81 MG chewable tablet Chew 1 tablet (81 mg total) by mouth daily. 05/28/22   Osvaldo Shipper, MD  Budeson-Glycopyrrol-Formoterol [facility]  AEROSPHERE) 160-9-4.8 MCG/ACT AERO Inhale 2 puffs into the lungs in the morning and at bedtime. 12/09/20   Hetty Blend, FNP  buPROPion (WELLBUTRIN SR) 150 MG 12 hr tablet Take 150 mg by mouth 2 (two) times daily.    [provider]  cetirizine (ZYRTEC) 10 MG tablet Take 1 tablet (10 mg total) by mouth daily. 12/09/20   Hetty Blend, FNP  cloNIDine (CATAPRES) 0.2 MG tablet Take 1 tablet (0.2 mg total) by mouth 3 (three) times daily. 01/05/21   Marinus Maw, MD  docusate sodium (COLACE) 100 MG capsule Take 1 capsule (100 mg total) by mouth 2 (two) times daily. 10/25/20   Leroy Sea, MD  Evolocumab (REPATHA SURECLICK) 140 MG/ML SOAJ Inject 140 mg into the skin every 14 (fourteen) days. 06/29/22   O'NealRonnald Ramp, MD  ezetimibe (ZETIA) 10 MG tablet Take 1 tablet (10 mg total) by mouth daily. 07/28/22   Sande Rives, MD  fluticasone (FLONASE) 50 MCG/ACT nasal spray Place 1 spray into both nostrils daily as needed for allergies. Patient not taking: Reported on  06/06/2022 09/08/20   Hetty Blend, FNP  gabapentin (NEURONTIN) 100 MG capsule Take 1 capsule (100 mg total) by mouth 3 (three) times daily. Patient not taking: Reported on 06/06/2022 09/08/19   Levert Feinstein, MD  hydrALAZINE (APRESOLINE) 100 MG tablet Take 1 tablet (100 mg total) by mouth 2 (two) times daily. 07/28/22   O'NealRonnald Ramp, MD  isosorbide dinitrate (ISORDIL) 30 MG tablet Take 1 tablet (30 mg total) by mouth 3 (three) times daily. 05/27/22   Osvaldo Shipper, MD  metoprolol succinate (TOPROL-XL) 100 MG 24 hr tablet Take 1 tablet (100 mg total) by mouth 2 (two) times daily. Take with or immediately following a meal. 05/27/22   Osvaldo Shipper, MD  montelukast (SINGULAIR) 10 MG tablet Take 1 tablet (10 mg total) by mouth at bedtime. 12/09/20 05/27/23  Hetty Blend, FNP  nitroGLYCERIN (NITROSTAT) 0.4 MG SL tablet Place 0.4 mg under the tongue every 5 (five) minutes as needed for chest pain.    [provider]  ondansetron (ZOFRAN ODT) 4 MG disintegrating tablet Take 1 tablet (4 mg total) by mouth every 8 (eight) hours as needed for nausea or vomiting. Patient not taking: Reported on 06/06/2022 12/17/20   Tressia Danas, MD  paliperidone (INVEGA) 6 MG 24 hr tablet Take 6 mg by mouth at bedtime.    [provider]  Pancrelipase, Lip-Prot-Amyl, (ZENPEP) 40000-126000 units CPEP Take 1 tablet by mouth with breakfast, with lunch, and with evening meal. 04/12/20   Tressia Danas, MD  pantoprazole (PROTONIX) 40 MG tablet TAKE 1 TABLET(40 MG) BY MOUTH TWICE DAILY Patient taking differently: Take 40 mg by mouth 2 (two) times daily. 07/06/21   Tressia Danas, MD  Spacer/Aero-Holding Rudean Curt Use as directed with inhaler. 07/12/21   Nehemiah Settle, FNP  sucralfate (CARAFATE) 1 GM/10ML suspension Take 10 mLs (1 g total) by mouth 4 (four) times daily -  with meals and at bedtime. Patient taking differently: Take 1 g by mouth daily as needed (for ulcers). 10/25/20   Leroy Sea, MD     Family History    Family History  Problem Relation Age of Onset   High blood pressure Mother    Alzheimer's disease Father    Heart attack Brother    Colon cancer Neg Hx    Esophageal cancer Neg Hx    Rectal cancer Neg Hx    Stomach  cancer Neg Hx    He indicated that his mother is deceased. He indicated that his father is deceased. He indicated that only one of his two brothers is alive. He indicated that his maternal grandmother is deceased. He indicated that his maternal grandfather is deceased. He indicated that his paternal grandmother is deceased. He indicated that his paternal grandfather is deceased. He indicated that the status of his neg hx is unknown.  Social History    Social History   Socioeconomic History   Marital status: Married    Spouse name: Not on file   Number of children: 1   Years of education: 12   Highest education level: High school graduate  Occupational History   Occupation: Retired  Tobacco Use   Smoking status: Former    Packs/day: 1.00    Years: 30.00    Additional pack years: 0.00    Total pack years: 30.00    Types: Cigarettes    Quit date: 05/08/2008    Years since quitting: 14.3   Smokeless tobacco: Never  Vaping Use   Vaping Use: Never used  Substance and Sexual Activity   Alcohol use: No   Drug use: No   Sexual activity: Never  Other Topics Concern   Not on file  Social History Narrative   Lives at home with wife.   Right-handed.   One cup caffeine per day.   Social Determinants of Health   Financial Resource Strain: Not on file  Food Insecurity: Not on file  Transportation Needs: Not on file  Physical Activity: Not on file  Stress: Not on file  Social Connections: Not on file  Intimate Partner Violence: Not on file     Review of Systems    General:  No chills, fever, night sweats or weight changes.  Cardiovascular:  No chest pain, dyspnea on exertion, edema, orthopnea, palpitations, paroxysmal nocturnal  dyspnea. Dermatological: No rash, lesions/masses Respiratory: No cough, dyspnea Urologic: No hematuria, dysuria Abdominal:   No nausea, vomiting, diarrhea, bright red blood per rectum, melena, or hematemesis Neurologic:  No visual changes, wkns, changes in mental status. All other systems reviewed and are otherwise negative except as noted above.  Physical Exam    VS:  BP (!) 148/90   Pulse 87   Ht 6\' 1"  (1.854 m)   Wt 194 lb (88 kg)   SpO2 97%   BMI 25.60 kg/m  , BMI Body mass index is 25.6 kg/m. GEN: Well nourished, well developed, in no acute distress. HEENT: normal. Neck: Supple, no JVD, carotid bruits, or masses. Cardiac: RRR, no murmurs, rubs, or gallops. No clubbing, cyanosis, edema.  Radials/DP/PT 2+ and equal bilaterally.  Respiratory:  Respirations regular and unlabored, clear to auscultation bilaterally. GI: Soft, nontender, nondistended, BS + x 4. MS: no deformity or atrophy. Skin: warm and dry, no rash. Neuro:  Strength and sensation are intact. Psych: Normal affect.  Accessory Clinical Findings    Recent Labs: 09/28/2021: ALT 14 05/26/2022: B Natriuretic Peptide 62.9 06/01/2022: BUN 28; Creatinine, Ser 2.24; Hemoglobin 12.8; Platelets 196; Potassium 4.3; Sodium 135   Recent Lipid Panel    Component Value Date/Time   CHOL 149 09/20/2020 0943   TRIG 119 09/20/2020 0943   HDL 26 (L) 09/20/2020 0943   CHOLHDL 5.7 (H) 09/20/2020 0943   CHOLHDL 6.8 11/20/2018 0432   VLDL 19 11/20/2018 0432   LDLCALC 101 (H) 09/20/2020 0943    HYPERTENSION CONTROL Vitals:   09/05/22 0853 09/05/22 0919  BP: [name])  184/110 (!) 148/90    The patient's blood pressure is elevated above target today.  In order to address the patient's elevated BP: The blood pressure is usually elevated in clinic.  Blood pressures monitored at home have been optimal.; Blood pressure will be monitored at home to determine if medication changes need to be made. (Reports blood pressures in the 110s over  80s at home this morning.)       ECG personally reviewed by me today- none today.   Assessment & Plan   1.  Coronary artery disease-no chest pain today.  Underwent cardiac catheterization in the setting of NSTEMI and received PCI with DES to his proximal LAD. Continue Plavix, aspirin, ezetimibe, isosorbide, metoprolol Heart healthy low-sodium diet-salty 6 given Increase physical activity as tolerated  Hyperlipidemia-working with pharmacy lipid clinic.  Unable to tolerate Repatha.  Tolerating ezetimibe well.  Pharmacy checked on inclisiran injection.  Prescription would require patient to pay out-of-pocket $3600 due to insurance.  He was encouraged to think about resuming Repatha. Plan for repeat lab work 6/24.  Ischemic cardiomyopathy-euvolemic today.  Weight stable.  Increasing physical activity.  Echocardiogram showed an EF of 40-45% with severe asymmetric LVH, G1 DD and trivial mitral valve regurgitation. Continue metoprolol, isosorbide, hydralazine  Essential hypertension-BP today 148/90. At home this morning bp was 110/84.  Appears to have  whitecoat hypertension.  Provided batteries for blood pressure cuff and cuff was within 10 points of repeat blood pressure. Maintain blood pressure log-patient will send 2-week blood pressure log back to clinic. Low-sodium diet-salty given Continue clonidine, hydralazine, metoprolol  Paroxysmal atrial fibrillation-heart rate today 87 bpm.  Denies episodes of accelerated or irregular heartbeat.  Not a candidate for anticoagulation due to history of GI bleed. Continue rate control Avoid triggers caffeine, chocolate, EtOH, dehydration etc.  LBBB, sinus node dysfunction-underwent PPM 10/18.  Denies episodes of lightheadedness, presyncope and syncope.  Remote device check 06/25/2022.  Appropriate histograms.  Battery and leads stable.  On 42-month interval. Follows with EP  Abdominal aortic aneurysm-CT 6/22 showed 3.2 cm aortic aneurysm.  Follows  with vascular surgery.  Repeat abdominal ultrasound plan for 8/24. Maintain good blood pressure  Disposition: Follow-up with Dr. Flora Lipps or me in 3-4 months.   Thomasene Ripple. Vaneta Hammontree NP-C     09/05/2022, 9:25 AM [facility] [address] Office [phone] Fax [phone]    I spent 15 minutes examining this patient, reviewing medications, and using patient centered shared decision making involving her cardiac care.  Prior to her visit I spent greater than 20 minutes reviewing her past medical history,  medications, and prior cardiac tests.

## 2022-09-04 NOTE — Patient Instructions (Signed)
Follow up appointment: tomorrow with Edd Fabian NP  Take your BP meds as follows: continue with your current medications  Check your blood pressure at home daily (if able) and keep record of the readings.  Hypertension "High blood pressure"  Hypertension is often called "The Silent Killer." It rarely causes symptoms until it is extremely  high or has done damage to other organs in the body. For this reason, you should have your  blood pressure checked regularly by your physician. We will check your blood pressure  every time you see a provider at one of our offices.   Your blood pressure reading consists of two numbers. Ideally, blood pressure should be  below 120/80. The first ("top") number is called the systolic pressure. It measures the  pressure in your arteries as your heart beats. The second ("bottom") number is called the diastolic pressure. It measures the pressure in your arteries as the heart relaxes between beats.  The benefits of getting your blood pressure under control are enormous. A 10-point  reduction in systolic blood pressure can reduce your risk of stroke by 27% and heart failure by 28%  Your blood pressure goal is < 130/80  To check your pressure at home you will need to:  1. Sit up in a chair, with feet flat on the floor and back supported. Do not cross your ankles or legs. 2. Rest your left arm so that the cuff is about heart level. If the cuff goes on your upper arm,  then just relax the arm on the table, arm of the chair or your lap. If you have a wrist cuff, we  suggest relaxing your wrist against your chest (think of it as Pledging the Flag with the  wrong arm).  3. Place the cuff snugly around your arm, about 1 inch above the crook of your elbow. The  cords should be inside the groove of your elbow.  4. Sit quietly, with the cuff in place, for about 5 minutes. After that 5 minutes press the power  button to start a reading. 5. Do not talk or move  while the reading is taking place.  6. Record your readings on a sheet of paper. Although most cuffs have a memory, it is often  easier to see a pattern developing when the numbers are all in front of you.  7. You can repeat the reading after 1-3 minutes if it is recommended  Make sure your bladder is empty and you have not had caffeine or tobacco within the last 30 min  Always bring your blood pressure log with you to your appointments. If you have not brought your monitor in to be double checked for accuracy, please bring it to your next appointment.  You can find a list of quality blood pressure cuffs at [email or web address]

## 2022-09-04 NOTE — Progress Notes (Signed)
Office Visit    Patient Name: Peter Marsh Date of Encounter: 09/04/2022  Primary Care Provider:  Tracey Harries, MD Primary Cardiologist:  Reatha Harps, MD  Chief Complaint    Hypertension  Significant Past Medical History   CAD NSTEMI, PCI- DES to pLAD  LBBB Ppm 2018  AF Not on anticoagulation 2/2 GI bleed hx  HLD        Allergies  Allergen Reactions   Methylpyrrolidone Hives    froze the intestine   Niacin Itching, Nausea And Vomiting and Other (See Comments)    Flushing, itching, tingling    Ace Inhibitors     Other reaction(s): CKD 4   Norvasc [Amlodipine Besylate] Other (See Comments)    Swollen Feet   Other Other (See Comments)   Oxybutynin Chloride [Oxybutynin Chloride Er] Other (See Comments)    froze the intestine   Statins     Reports severe reaction but cannot remember exactly what it was    Vesicare [Solifenacin Succinate] Other (See Comments)    Froze the intestine    Ciprofloxacin Rash and Other (See Comments)    Felt flushed    Oxybutynin Rash and Other (See Comments)    bowel obst   Solifenacin Rash    History of Present Illness    Peter Marsh is a 62 y.o. male patient of Dr Flora Lipps, in the office today for follow up of blood pressure and cholesterol management.  He was most recently seen by Carmela Hurt PharmD last month.  At that time his BP was elevated at 145/89.  She increased his hydralazine to 100 mg three times daily.  He reports that home readings have improved, only 1 reading > 150 since last visit.  Unfortunately did not bring home meter for validation.   By looking at fill dates looks to be mostly compliant with his medications.    Blood Pressure Goal:  130/80  Current Medications:  clonidine 0.2 mg tid, hydralazine 100 mg tid, metoprolol succ 100 mg qd  Previously tried: ACEI - CKD; amlodipine - edema  Family Hx:  father died Alzheimer's at 25, mother around 57 from complications after damaging neck/throat; one brother  deceased lung cancer at 76, younger brother healthy now 40; one son  Social Hx:      Tobacco: quit 15 years ago  Alcohol: one  Caffeine: Mt. Dew occasionally  Diet:   low sodium;  has interstitial cystis, so has to be careful of trigger foods.  Prior chef, likes rosemary chicken, uses crock pot to cook; otherwise salad and fruits/vegetables  Exercise: wants to join [employer]  Home BP readings:  no readings with him, but thinks mostly 120's, only 1 time up to 151; did not bring home meter, notes batteries died and he won't get paid until later this week    Accessory Clinical Findings    Lab Results  Component Value Date   CREATININE 2.24 (H) 06/01/2022   BUN 28 (H) 06/01/2022   NA 135 06/01/2022   K 4.3 06/01/2022   CL 106 06/01/2022   CO2 20 (L) 06/01/2022   Lab Results  Component Value Date   ALT 14 09/28/2021   AST 18 09/28/2021   ALKPHOS 77 09/28/2021   BILITOT 0.9 09/28/2021   Lab Results  Component Value Date   HGBA1C 5.5 09/08/2019    Home Medications    Current Outpatient Medications  Medication Sig Dispense Refill   albuterol (VENTOLIN HFA) 108 (90 Base) MCG/ACT  inhaler INHALE 2 PUFFS INTO THE LUNGS EVERY 6 HOURS AS NEEDED FOR WHEEZING OR SHORTNESS OF BREATH 18 g 0   aspirin 81 MG chewable tablet Chew 1 tablet (81 mg total) by mouth daily. 30 tablet 3   Budeson-Glycopyrrol-Formoterol (BREZTRI AEROSPHERE) 160-9-4.8 MCG/ACT AERO Inhale 2 puffs into the lungs in the morning and at bedtime. 10.7 g 5   buPROPion (WELLBUTRIN SR) 150 MG 12 hr tablet Take 150 mg by mouth 2 (two) times daily.     cetirizine (ZYRTEC) 10 MG tablet Take 1 tablet (10 mg total) by mouth daily. 30 tablet 5   cloNIDine (CATAPRES) 0.2 MG tablet Take 1 tablet (0.2 mg total) by mouth 3 (three) times daily. 270 tablet 3   clopidogrel (PLAVIX) 75 MG tablet Take 1 tablet (75 mg total) by mouth daily. 90 tablet 3   docusate sodium (COLACE) 100 MG capsule Take 1 capsule (100 mg total) by mouth  2 (two) times daily. 60 capsule 0   ezetimibe (ZETIA) 10 MG tablet Take 1 tablet (10 mg total) by mouth daily. 90 tablet 3   fluticasone (FLONASE) 50 MCG/ACT nasal spray Place 1 spray into both nostrils daily as needed for allergies. 11.1 mL 3   hydrALAZINE (APRESOLINE) 100 MG tablet Take 1 tablet (100 mg total) by mouth 2 (two) times daily. 270 tablet 3   isosorbide dinitrate (ISORDIL) 30 MG tablet Take 1 tablet (30 mg total) by mouth 3 (three) times daily. 90 tablet 1   metoprolol succinate (TOPROL-XL) 100 MG 24 hr tablet Take 1 tablet (100 mg total) by mouth 2 (two) times daily. Take with or immediately following a meal. 60 tablet 2   montelukast (SINGULAIR) 10 MG tablet Take 1 tablet (10 mg total) by mouth at bedtime. 90 tablet 1   nitroGLYCERIN (NITROSTAT) 0.4 MG SL tablet Place 0.4 mg under the tongue every 5 (five) minutes as needed for chest pain.     paliperidone (INVEGA) 6 MG 24 hr tablet Take 6 mg by mouth at bedtime.     pantoprazole (PROTONIX) 40 MG tablet TAKE 1 TABLET(40 MG) BY MOUTH TWICE DAILY (Patient taking differently: Take 40 mg by mouth 2 (two) times daily.) 60 tablet 5   Spacer/Aero-Holding [facility] Use as directed with inhaler. 1 each 0   sucralfate (CARAFATE) 1 GM/10ML suspension Take 10 mLs (1 g total) by mouth 4 (four) times daily -  with meals and at bedtime. (Patient taking differently: Take 1 g by mouth daily as needed (for ulcers).) 3600 mL 3   ondansetron (ZOFRAN ODT) 4 MG disintegrating tablet Take 1 tablet (4 mg total) by mouth every 8 (eight) hours as needed for nausea or vomiting. (Patient not taking: Reported on 06/06/2022) 10 tablet 0   No current facility-administered medications for this visit.   Facility-Administered Medications Ordered in Other Visits  Medication Dose Route Frequency Provider Last Rate Last Admin   acetaminophen (OFIRMEV) IVPB    PRN Illene Silver, CRNA   1,000 mg at 04/19/11 0910   glycopyrrolate (ROBINUL) injection    PRN Illene Silver, CRNA   0.8 mg at 04/19/11 1037     HYPERTENSION CONTROL Vitals:   09/04/22 1556 09/04/22 1600  BP: (!) 148/92 (!) 144/91    The patient's blood pressure is elevated above target today.  In order to address the patient's elevated BP: Blood pressure will be monitored at home to determine if medication changes need to be made.      Assessment &  Plan    Essential hypertension Assessment: BP is un/controlled in office BP 144/91 mmHg;  above the goal (<130/80). Home meter out of batteries, cannot afford until later in the week Tolerates current medications; well without any side effects Denies SOB, palpitation, chest pain, headaches,or swelling Reiterated the importance of regular exercise and low salt diet   Plan:  Continue taking hydralazine 100 mg tid, clonidine 0.2 mg tid, metoprolol succ 100 mg qd Patient to bring home BP meter to office tomorrow, we have batteries that can be used to test the accuracy of his meter Patient to follow up with Edd Fabian tomorrow.  If cuff accurate and home readings still elevated, may need to increase clonidine to 0.3 mg tid; cannot switch to carvedilol 2/2 breathing issues and need to avoid diuretics, ACEI/ARB 2/2 renal function   Phillips Hay PharmD CPP [facility]  [address] [place], [place] [address] [phone]

## 2022-09-04 NOTE — Assessment & Plan Note (Signed)
Assessment: BP is un/controlled in office BP 144/91 mmHg;  above the goal (<130/80). Home meter out of batteries, cannot afford until later in the week Tolerates current medications; well without any side effects Denies SOB, palpitation, chest pain, headaches,or swelling Reiterated the importance of regular exercise and low salt diet   Plan:  Continue taking hydralazine 100 mg tid, clonidine 0.2 mg tid, metoprolol succ 100 mg qd Patient to bring home BP meter to office tomorrow, we have batteries that can be used to test the accuracy of his meter Patient to follow up with Edd Fabian tomorrow.  If cuff accurate and home readings still elevated, may need to increase clonidine to 0.3 mg tid; cannot switch to carvedilol 2/2 breathing issues and need to avoid diuretics, ACEI/ARB 2/2 renal function

## 2022-09-05 ENCOUNTER — Ambulatory Visit: Payer: 59 | Attending: General Practice | Admitting: General Practice

## 2022-09-05 ENCOUNTER — Encounter: Payer: Self-pay | Admitting: General Practice

## 2022-09-05 VITALS — BP 148/90 | HR 87 | Ht 73.0 in | Wt 194.0 lb

## 2022-09-05 DIAGNOSIS — I2510 Atherosclerotic heart disease of native coronary artery without angina pectoris: Secondary | ICD-10-CM | POA: Diagnosis not present

## 2022-09-05 DIAGNOSIS — I10 Essential (primary) hypertension: Secondary | ICD-10-CM

## 2022-09-05 DIAGNOSIS — I255 Ischemic cardiomyopathy: Secondary | ICD-10-CM | POA: Diagnosis not present

## 2022-09-05 DIAGNOSIS — I480 Paroxysmal atrial fibrillation: Secondary | ICD-10-CM

## 2022-09-05 DIAGNOSIS — E785 Hyperlipidemia, unspecified: Secondary | ICD-10-CM

## 2022-09-05 DIAGNOSIS — I714 Abdominal aortic aneurysm, without rupture, unspecified: Secondary | ICD-10-CM

## 2022-09-05 DIAGNOSIS — I447 Left bundle-branch block, unspecified: Secondary | ICD-10-CM

## 2022-09-05 NOTE — Patient Instructions (Signed)
Medication Instructions:  The current medical regimen is effective;  continue present plan and medications as directed. Please refer to the Current Medication list given to you today.  *If you need a refill on your cardiac medications before your next appointment, please call your pharmacy*  Lab Work: NONE If you have labs (blood work) drawn today and your tests are completely normal, you will receive your results only by: MyChart Message (if you have MyChart) OR A paper copy in the mail If you have any lab test that is abnormal or we need to change your treatment, we will call you to review the results.  Testing/Procedures: NONE   Follow-Up: At [facility], you and your health needs are our priority.  As part of our continuing mission to provide you with exceptional heart care, we have created designated Provider Care Teams.  These Care Teams include your primary Cardiologist (physician) and Advanced Practice Providers (APPs -  Physician Assistants and Nurse Practitioners) who all work together to provide you with the care you need, when you need it.  Your next appointment:   2-3 month(s)  Provider:   Reatha Harps, MD  or Edd Fabian, FNP        Other Instructions TAKE AND LOG YOUR BLOOD PRESSURE-PROVIDE BLOOD PRESSURE NUMBERS AFTER 2 WEEKS  PLEASE READ AND FOLLOW ATTACHED  SALTY 6

## 2022-09-15 ENCOUNTER — Telehealth: Payer: Self-pay

## 2022-09-15 ENCOUNTER — Other Ambulatory Visit: Payer: Self-pay | Admitting: Pharmacist

## 2022-09-15 DIAGNOSIS — I10 Essential (primary) hypertension: Secondary | ICD-10-CM

## 2022-09-15 MED ORDER — CLONIDINE HCL 0.2 MG PO TABS
0.2000 mg | ORAL_TABLET | Freq: Three times a day (TID) | ORAL | 3 refills | Status: DC
Start: 2022-09-15 — End: 2022-09-19

## 2022-09-15 MED ORDER — ISOSORBIDE DINITRATE 30 MG PO TABS
30.0000 mg | ORAL_TABLET | Freq: Three times a day (TID) | ORAL | 1 refills | Status: DC
Start: 2022-09-15 — End: 2022-09-19

## 2022-09-15 MED ORDER — HYDRALAZINE HCL 100 MG PO TABS
100.0000 mg | ORAL_TABLET | Freq: Three times a day (TID) | ORAL | 1 refills | Status: DC
Start: 2022-09-15 — End: 2022-09-19

## 2022-09-15 NOTE — Telephone Encounter (Signed)
Checked patients chart and it looks as if all of his medications were refilled today by another employee in our office. Nothing else needs to be done!

## 2022-09-15 NOTE — Telephone Encounter (Signed)
Please verify OK to send Clonidine  3 times daily.  Has not been sent since 2022.  Patient requesting this along with refills of isosorbide, hydralazine . Need to verify pharmacy with patient.

## 2022-09-15 NOTE — Telephone Encounter (Signed)
This patient was just seen by Gwendolyn Lima, NP April of this year. He can have all of his medications refilled. I will call him and get everything handled. Thanks!

## 2022-09-15 NOTE — Telephone Encounter (Signed)
Patient walked in. Needs updated prescriptions due to dose increases from last visits. Refilled and sent to pharmacy.

## 2022-09-19 ENCOUNTER — Other Ambulatory Visit: Payer: Self-pay | Admitting: Cardiovascular Disease

## 2022-09-19 ENCOUNTER — Telehealth: Payer: Self-pay | Admitting: Pharmacist

## 2022-09-19 DIAGNOSIS — I10 Essential (primary) hypertension: Secondary | ICD-10-CM

## 2022-09-19 MED ORDER — CLONIDINE HCL 0.2 MG PO TABS
0.2000 mg | ORAL_TABLET | Freq: Three times a day (TID) | ORAL | 3 refills | Status: AC
Start: 2022-09-19 — End: ?

## 2022-09-19 MED ORDER — ISOSORBIDE DINITRATE 30 MG PO TABS
30.0000 mg | ORAL_TABLET | Freq: Three times a day (TID) | ORAL | 1 refills | Status: DC
Start: 2022-09-19 — End: 2022-09-19

## 2022-09-19 MED ORDER — HYDRALAZINE HCL 100 MG PO TABS
100.0000 mg | ORAL_TABLET | Freq: Three times a day (TID) | ORAL | 1 refills | Status: DC
Start: 2022-09-19 — End: 2022-11-28

## 2022-09-19 NOTE — Telephone Encounter (Signed)
Patient walked in again and says he wants the medications previously sent to [facility] to be sent to [facility]

## 2022-09-25 ENCOUNTER — Ambulatory Visit (INDEPENDENT_AMBULATORY_CARE_PROVIDER_SITE_OTHER): Payer: 59

## 2022-09-25 DIAGNOSIS — I255 Ischemic cardiomyopathy: Secondary | ICD-10-CM | POA: Diagnosis not present

## 2022-09-25 DIAGNOSIS — R55 Syncope and collapse: Secondary | ICD-10-CM

## 2022-09-25 LAB — CUP PACEART REMOTE DEVICE CHECK
Battery Remaining Longevity: 93 mo
Battery Voltage: 2.99 V
Brady Statistic AP VP Percent: 0.37 %
Brady Statistic AP VS Percent: 98.78 %
Brady Statistic AS VP Percent: 0 %
Brady Statistic AS VS Percent: 0.85 %
Brady Statistic RA Percent Paced: 99.16 %
Brady Statistic RV Percent Paced: 0.37 %
Date Time Interrogation Session: 20240520020538
Implantable Lead Connection Status: 753985
Implantable Lead Connection Status: 753985
Implantable Lead Implant Date: 20181022
Implantable Lead Implant Date: 20181022
Implantable Lead Location: 753859
Implantable Lead Location: 753860
Implantable Lead Model: 3830
Implantable Lead Model: 5076
Implantable Pulse Generator Implant Date: 20181022
Lead Channel Impedance Value: 285 Ohm
Lead Channel Impedance Value: 323 Ohm
Lead Channel Impedance Value: 456 Ohm
Lead Channel Impedance Value: 513 Ohm
Lead Channel Pacing Threshold Amplitude: 0.75 V
Lead Channel Pacing Threshold Amplitude: 1.125 V
Lead Channel Pacing Threshold Pulse Width: 0.4 ms
Lead Channel Pacing Threshold Pulse Width: 0.4 ms
Lead Channel Sensing Intrinsic Amplitude: 2.875 mV
Lead Channel Sensing Intrinsic Amplitude: 2.875 mV
Lead Channel Sensing Intrinsic Amplitude: 8.375 mV
Lead Channel Sensing Intrinsic Amplitude: 8.375 mV
Lead Channel Setting Pacing Amplitude: 1.5 V
Lead Channel Setting Pacing Amplitude: 2.5 V
Lead Channel Setting Pacing Pulse Width: 1 ms
Lead Channel Setting Sensing Sensitivity: 1.2 mV
Zone Setting Status: 755011
Zone Setting Status: 755011

## 2022-09-25 NOTE — Telephone Encounter (Signed)
PA request

## 2022-10-01 ENCOUNTER — Encounter (HOSPITAL_COMMUNITY): Payer: Self-pay | Admitting: *Deleted

## 2022-10-01 ENCOUNTER — Other Ambulatory Visit: Payer: Self-pay

## 2022-10-01 ENCOUNTER — Emergency Department (HOSPITAL_COMMUNITY): Payer: 59

## 2022-10-01 ENCOUNTER — Inpatient Hospital Stay (HOSPITAL_COMMUNITY)
Admission: EM | Admit: 2022-10-01 | Discharge: 2022-10-05 | DRG: 445 | Disposition: A | Discharge status: Home / Self Care | Payer: 59 | Attending: Internal Medicine | Admitting: Internal Medicine

## 2022-10-01 DIAGNOSIS — I5043 Acute on chronic combined systolic (congestive) and diastolic (congestive) heart failure: Secondary | ICD-10-CM | POA: Insufficient documentation

## 2022-10-01 DIAGNOSIS — F32A Depression, unspecified: Secondary | ICD-10-CM | POA: Diagnosis present

## 2022-10-01 DIAGNOSIS — E1140 Type 2 diabetes mellitus with diabetic neuropathy, unspecified: Secondary | ICD-10-CM | POA: Diagnosis present

## 2022-10-01 DIAGNOSIS — I447 Left bundle-branch block, unspecified: Secondary | ICD-10-CM | POA: Diagnosis present

## 2022-10-01 DIAGNOSIS — I5032 Chronic diastolic (congestive) heart failure: Secondary | ICD-10-CM | POA: Insufficient documentation

## 2022-10-01 DIAGNOSIS — I4892 Unspecified atrial flutter: Secondary | ICD-10-CM | POA: Diagnosis present

## 2022-10-01 DIAGNOSIS — Z8616 Personal history of COVID-19: Secondary | ICD-10-CM

## 2022-10-01 DIAGNOSIS — N200 Calculus of kidney: Secondary | ICD-10-CM | POA: Diagnosis present

## 2022-10-01 DIAGNOSIS — I252 Old myocardial infarction: Secondary | ICD-10-CM

## 2022-10-01 DIAGNOSIS — I495 Sick sinus syndrome: Secondary | ICD-10-CM | POA: Diagnosis present

## 2022-10-01 DIAGNOSIS — J439 Emphysema, unspecified: Secondary | ICD-10-CM | POA: Diagnosis present

## 2022-10-01 DIAGNOSIS — Z87891 Personal history of nicotine dependence: Secondary | ICD-10-CM

## 2022-10-01 DIAGNOSIS — I5042 Chronic combined systolic (congestive) and diastolic (congestive) heart failure: Secondary | ICD-10-CM | POA: Diagnosis present

## 2022-10-01 DIAGNOSIS — K819 Cholecystitis, unspecified: Principal | ICD-10-CM

## 2022-10-01 DIAGNOSIS — J4489 Other specified chronic obstructive pulmonary disease: Secondary | ICD-10-CM | POA: Diagnosis present

## 2022-10-01 DIAGNOSIS — Z950 Presence of cardiac pacemaker: Secondary | ICD-10-CM | POA: Diagnosis not present

## 2022-10-01 DIAGNOSIS — Z881 Allergy status to other antibiotic agents status: Secondary | ICD-10-CM

## 2022-10-01 DIAGNOSIS — I519 Heart disease, unspecified: Secondary | ICD-10-CM

## 2022-10-01 DIAGNOSIS — I7143 Infrarenal abdominal aortic aneurysm, without rupture: Secondary | ICD-10-CM

## 2022-10-01 DIAGNOSIS — Z886 Allergy status to analgesic agent status: Secondary | ICD-10-CM

## 2022-10-01 DIAGNOSIS — I255 Ischemic cardiomyopathy: Secondary | ICD-10-CM | POA: Diagnosis present

## 2022-10-01 DIAGNOSIS — Z01810 Encounter for preprocedural cardiovascular examination: Secondary | ICD-10-CM | POA: Diagnosis not present

## 2022-10-01 DIAGNOSIS — I130 Hypertensive heart and chronic kidney disease with heart failure and stage 1 through stage 4 chronic kidney disease, or unspecified chronic kidney disease: Secondary | ICD-10-CM | POA: Diagnosis present

## 2022-10-01 DIAGNOSIS — I480 Paroxysmal atrial fibrillation: Secondary | ICD-10-CM | POA: Diagnosis present

## 2022-10-01 DIAGNOSIS — I2510 Atherosclerotic heart disease of native coronary artery without angina pectoris: Secondary | ICD-10-CM | POA: Diagnosis present

## 2022-10-01 DIAGNOSIS — K279 Peptic ulcer, site unspecified, unspecified as acute or chronic, without hemorrhage or perforation: Secondary | ICD-10-CM

## 2022-10-01 DIAGNOSIS — F419 Anxiety disorder, unspecified: Secondary | ICD-10-CM | POA: Diagnosis present

## 2022-10-01 DIAGNOSIS — K810 Acute cholecystitis: Secondary | ICD-10-CM | POA: Diagnosis not present

## 2022-10-01 DIAGNOSIS — J4520 Mild intermittent asthma, uncomplicated: Secondary | ICD-10-CM | POA: Diagnosis present

## 2022-10-01 DIAGNOSIS — Z8711 Personal history of peptic ulcer disease: Secondary | ICD-10-CM

## 2022-10-01 DIAGNOSIS — I441 Atrioventricular block, second degree: Secondary | ICD-10-CM | POA: Diagnosis present

## 2022-10-01 DIAGNOSIS — K8000 Calculus of gallbladder with acute cholecystitis without obstruction: Principal | ICD-10-CM | POA: Diagnosis present

## 2022-10-01 DIAGNOSIS — I10 Essential (primary) hypertension: Secondary | ICD-10-CM | POA: Diagnosis present

## 2022-10-01 DIAGNOSIS — Z79899 Other long term (current) drug therapy: Secondary | ICD-10-CM

## 2022-10-01 DIAGNOSIS — I712 Thoracic aortic aneurysm, without rupture, unspecified: Secondary | ICD-10-CM | POA: Diagnosis present

## 2022-10-01 DIAGNOSIS — K861 Other chronic pancreatitis: Secondary | ICD-10-CM | POA: Diagnosis present

## 2022-10-01 DIAGNOSIS — Z7902 Long term (current) use of antithrombotics/antiplatelets: Secondary | ICD-10-CM

## 2022-10-01 DIAGNOSIS — E782 Mixed hyperlipidemia: Secondary | ICD-10-CM | POA: Diagnosis present

## 2022-10-01 DIAGNOSIS — Z888 Allergy status to other drugs, medicaments and biological substances status: Secondary | ICD-10-CM

## 2022-10-01 DIAGNOSIS — Z955 Presence of coronary angioplasty implant and graft: Secondary | ICD-10-CM

## 2022-10-01 DIAGNOSIS — E785 Hyperlipidemia, unspecified: Secondary | ICD-10-CM | POA: Diagnosis present

## 2022-10-01 DIAGNOSIS — Z820 Family history of epilepsy and other diseases of the nervous system: Secondary | ICD-10-CM

## 2022-10-01 DIAGNOSIS — Z8249 Family history of ischemic heart disease and other diseases of the circulatory system: Secondary | ICD-10-CM

## 2022-10-01 DIAGNOSIS — N1832 Chronic kidney disease, stage 3b: Secondary | ICD-10-CM | POA: Diagnosis present

## 2022-10-01 DIAGNOSIS — N281 Cyst of kidney, acquired: Secondary | ICD-10-CM | POA: Diagnosis present

## 2022-10-01 DIAGNOSIS — K259 Gastric ulcer, unspecified as acute or chronic, without hemorrhage or perforation: Secondary | ICD-10-CM | POA: Diagnosis present

## 2022-10-01 DIAGNOSIS — K219 Gastro-esophageal reflux disease without esophagitis: Secondary | ICD-10-CM | POA: Diagnosis present

## 2022-10-01 DIAGNOSIS — Z1152 Encounter for screening for COVID-19: Secondary | ICD-10-CM

## 2022-10-01 DIAGNOSIS — E1122 Type 2 diabetes mellitus with diabetic chronic kidney disease: Secondary | ICD-10-CM | POA: Diagnosis present

## 2022-10-01 DIAGNOSIS — F209 Schizophrenia, unspecified: Secondary | ICD-10-CM | POA: Diagnosis present

## 2022-10-01 DIAGNOSIS — E871 Hypo-osmolality and hyponatremia: Secondary | ICD-10-CM | POA: Diagnosis present

## 2022-10-01 DIAGNOSIS — Z7982 Long term (current) use of aspirin: Secondary | ICD-10-CM

## 2022-10-01 LAB — COMPREHENSIVE METABOLIC PANEL
ALT: 17 U/L (ref 0–44)
AST: 23 U/L (ref 15–41)
Albumin: 3.7 g/dL (ref 3.5–5.0)
Alkaline Phosphatase: 66 U/L (ref 38–126)
Anion gap: 8 (ref 5–15)
BUN: 25 mg/dL — ABNORMAL HIGH (ref 8–23)
CO2: 25 mmol/L (ref 22–32)
Calcium: 8.7 mg/dL — ABNORMAL LOW (ref 8.9–10.3)
Chloride: 100 mmol/L (ref 98–111)
Creatinine, Ser: 1.94 mg/dL — ABNORMAL HIGH (ref 0.61–1.24)
GFR, Estimated: 38 mL/min — ABNORMAL LOW (ref >60)
Glucose, Bld: 104 mg/dL — ABNORMAL HIGH (ref 70–99)
Potassium: 4.6 mmol/L (ref 3.5–5.1)
Sodium: 133 mmol/L — ABNORMAL LOW (ref 135–145)
Total Bilirubin: 0.8 mg/dL (ref 0.3–1.2)
Total Protein: 6.9 g/dL (ref 6.5–8.1)

## 2022-10-01 LAB — URINALYSIS, ROUTINE W REFLEX MICROSCOPIC
Bilirubin Urine: NEGATIVE
Glucose, UA: NEGATIVE mg/dL
Hgb urine dipstick: NEGATIVE
Ketones, ur: NEGATIVE mg/dL
Leukocytes,Ua: NEGATIVE
Nitrite: NEGATIVE
Protein, ur: NEGATIVE mg/dL
Specific Gravity, Urine: 1.009 (ref 1.005–1.030)
pH: 6 (ref 5.0–8.0)

## 2022-10-01 LAB — CBC
HCT: 42.5 % (ref 39.0–52.0)
Hemoglobin: 13.4 g/dL (ref 13.0–17.0)
MCH: 28 pg (ref 26.0–34.0)
MCHC: 31.5 g/dL (ref 30.0–36.0)
MCV: 88.9 fL (ref 80.0–100.0)
Platelets: 210 10*3/uL (ref 150–400)
RBC: 4.78 MIL/uL (ref 4.22–5.81)
RDW: 12.5 % (ref 11.5–15.5)
WBC: 8.2 10*3/uL (ref 4.0–10.5)
nRBC: 0 % (ref 0.0–0.2)

## 2022-10-01 LAB — LIPASE, BLOOD: Lipase: 32 U/L (ref 11–51)

## 2022-10-01 MED ORDER — LABETALOL HCL 5 MG/ML IV SOLN: 5.0000 mg | INTRAVENOUS | Status: DC | PRN

## 2022-10-01 MED ORDER — ACETAMINOPHEN 650 MG RE SUPP: 650.0000 mg | Freq: Four times a day (QID) | RECTAL | Status: DC | PRN

## 2022-10-01 MED ORDER — SODIUM CHLORIDE 0.9 % IV BOLUS
500.0000 mL | Freq: Once | INTRAVENOUS | Status: AC
  Administered 2022-10-01: 500 mL via INTRAVENOUS

## 2022-10-01 MED ORDER — ISOSORBIDE DINITRATE 30 MG PO TABS
30.0000 mg | ORAL_TABLET | Freq: Three times a day (TID) | ORAL | Status: DC
  Administered 2022-10-01 – 2022-10-05 (×12): 30 mg via ORAL
  Filled 2022-10-01 (×8): qty 1
  Filled 2022-10-01: qty 3
  Filled 2022-10-01 (×4): qty 1

## 2022-10-01 MED ORDER — SUCRALFATE 1 GM/10ML PO SUSP: 1.0000 g | Freq: Every day | ORAL | Status: DC | PRN

## 2022-10-01 MED ORDER — MONTELUKAST SODIUM 10 MG PO TABS
10.0000 mg | ORAL_TABLET | Freq: Every day | ORAL | Status: DC
  Administered 2022-10-01 – 2022-10-04 (×4): 10 mg via ORAL
  Filled 2022-10-01 (×4): qty 1

## 2022-10-01 MED ORDER — ALBUTEROL SULFATE (2.5 MG/3ML) 0.083% IN NEBU: 3.0000 mL | INHALATION_SOLUTION | Freq: Four times a day (QID) | RESPIRATORY_TRACT | Status: DC | PRN

## 2022-10-01 MED ORDER — SODIUM CHLORIDE 0.9 % IV SOLN: INTRAVENOUS | Status: AC

## 2022-10-01 MED ORDER — IOHEXOL 350 MG/ML SOLN
100.0000 mL | Freq: Once | INTRAVENOUS | Status: AC | PRN
  Administered 2022-10-01: 100 mL via INTRAVENOUS

## 2022-10-01 MED ORDER — POLYETHYLENE GLYCOL 3350 17 G PO PACK: 17.0000 g | PACK | Freq: Every day | ORAL | Status: DC | PRN

## 2022-10-01 MED ORDER — LABETALOL HCL 5 MG/ML IV SOLN
5.0000 mg | INTRAVENOUS | Status: AC | PRN
  Administered 2022-10-01 – 2022-10-02 (×2): 5 mg via INTRAVENOUS
  Filled 2022-10-01 (×2): qty 4

## 2022-10-01 MED ORDER — ALUM & MAG HYDROXIDE-SIMETH 200-200-20 MG/5ML PO SUSP
30.0000 mL | ORAL | Status: DC | PRN
  Administered 2022-10-01: 30 mL via ORAL
  Filled 2022-10-01: qty 30

## 2022-10-01 MED ORDER — EZETIMIBE 10 MG PO TABS
10.0000 mg | ORAL_TABLET | Freq: Every day | ORAL | Status: DC
  Administered 2022-10-02 – 2022-10-05 (×4): 10 mg via ORAL
  Filled 2022-10-01 (×4): qty 1

## 2022-10-01 MED ORDER — ACETAMINOPHEN 325 MG PO TABS: 650.0000 mg | ORAL_TABLET | Freq: Four times a day (QID) | ORAL | Status: DC | PRN

## 2022-10-01 MED ORDER — ONDANSETRON HCL 4 MG/2ML IJ SOLN
4.0000 mg | Freq: Four times a day (QID) | INTRAMUSCULAR | Status: DC | PRN
  Administered 2022-10-01: 4 mg via INTRAVENOUS
  Filled 2022-10-01: qty 2

## 2022-10-01 MED ORDER — PALIPERIDONE ER 6 MG PO TB24
6.0000 mg | ORAL_TABLET | Freq: Every day | ORAL | Status: DC
  Administered 2022-10-01 – 2022-10-04 (×4): 6 mg via ORAL
  Filled 2022-10-01 (×5): qty 1

## 2022-10-01 MED ORDER — SODIUM CHLORIDE 0.9% FLUSH
3.0000 mL | Freq: Two times a day (BID) | INTRAVENOUS | Status: DC
  Administered 2022-10-01 – 2022-10-03 (×5): 3 mL via INTRAVENOUS

## 2022-10-01 MED ORDER — ONDANSETRON HCL 4 MG/2ML IJ SOLN
4.0000 mg | Freq: Once | INTRAMUSCULAR | Status: AC
  Administered 2022-10-01: 4 mg via INTRAVENOUS
  Filled 2022-10-01: qty 2

## 2022-10-01 MED ORDER — BUDESON-GLYCOPYRROL-FORMOTEROL 160-9-4.8 MCG/ACT IN AERO: 2.0000 | INHALATION_SPRAY | Freq: Two times a day (BID) | RESPIRATORY_TRACT | Status: DC

## 2022-10-01 MED ORDER — BUPROPION HCL ER (SR) 150 MG PO TB12
150.0000 mg | ORAL_TABLET | Freq: Two times a day (BID) | ORAL | Status: DC
  Administered 2022-10-01 – 2022-10-05 (×8): 150 mg via ORAL
  Filled 2022-10-01 (×8): qty 1

## 2022-10-01 MED ORDER — CLONIDINE HCL 0.2 MG PO TABS
0.2000 mg | ORAL_TABLET | Freq: Three times a day (TID) | ORAL | Status: DC
  Administered 2022-10-01 – 2022-10-05 (×12): 0.2 mg via ORAL
  Filled 2022-10-01 (×12): qty 1

## 2022-10-01 MED ORDER — PANTOPRAZOLE SODIUM 40 MG PO TBEC
40.0000 mg | DELAYED_RELEASE_TABLET | Freq: Two times a day (BID) | ORAL | Status: DC
  Administered 2022-10-01 – 2022-10-05 (×8): 40 mg via ORAL
  Filled 2022-10-01 (×8): qty 1

## 2022-10-01 MED ORDER — METOPROLOL SUCCINATE ER 100 MG PO TB24
100.0000 mg | ORAL_TABLET | Freq: Two times a day (BID) | ORAL | Status: DC
  Administered 2022-10-01 – 2022-10-05 (×8): 100 mg via ORAL
  Filled 2022-10-01 (×8): qty 1

## 2022-10-01 MED ORDER — CLOPIDOGREL BISULFATE 75 MG PO TABS
75.0000 mg | ORAL_TABLET | Freq: Every day | ORAL | Status: DC
  Administered 2022-10-02 – 2022-10-05 (×4): 75 mg via ORAL
  Filled 2022-10-01 (×4): qty 1

## 2022-10-01 MED ORDER — ASPIRIN 81 MG PO CHEW
81.0000 mg | CHEWABLE_TABLET | Freq: Every day | ORAL | Status: DC
  Administered 2022-10-02 – 2022-10-05 (×4): 81 mg via ORAL
  Filled 2022-10-01 (×4): qty 1

## 2022-10-01 MED ORDER — HYDRALAZINE HCL 25 MG PO TABS
100.0000 mg | ORAL_TABLET | Freq: Three times a day (TID) | ORAL | Status: DC
  Administered 2022-10-01 – 2022-10-05 (×12): 100 mg via ORAL
  Filled 2022-10-01 (×12): qty 4

## 2022-10-01 MED ORDER — FLUTICASONE FUROATE-VILANTEROL 100-25 MCG/ACT IN AEPB
1.0000 | INHALATION_SPRAY | Freq: Every day | RESPIRATORY_TRACT | Status: DC
  Administered 2022-10-02 – 2022-10-05 (×4): 1 via RESPIRATORY_TRACT
  Filled 2022-10-01: qty 28

## 2022-10-01 MED ORDER — UMECLIDINIUM BROMIDE 62.5 MCG/ACT IN AEPB
1.0000 | INHALATION_SPRAY | Freq: Every day | RESPIRATORY_TRACT | Status: DC
  Administered 2022-10-02 – 2022-10-05 (×4): 1 via RESPIRATORY_TRACT
  Filled 2022-10-01: qty 7

## 2022-10-01 NOTE — Consult Note (Signed)
Reason for Consult:abdominal pain Referring Provider: Ladean Raya is an 62 y.o. male.  HPI: 62 yo male with abdominal pain. Pain has been going on for about 1 week. Pain is worse on the right side. He has had no appetite. He has nausea but no vomiting. He has had some diarrhea. He denies fevers.  Past Medical History:  Diagnosis Date   Allergy    Anxiety    Asthma    uses inhalers    Bilateral carotid bruits    Cardiac conduction disorder 2018   s/p MDT PPM   Cataract    Chest pain 06/24/2018   Chronic kidney disease    bladder interstial cystitis    Chronic kidney disease (CKD), stage IV (severe) ([name])    followed by Dr. Marisue Humble at [facility]   COPD (chronic obstructive pulmonary disease) ([name])    Coronary artery disease    COVID-19 virus infection 10/21/2020   Depression    Diabetes mellitus without complication ([name])    Discord with neighbors, lodgers and landlord 11/26/2021   Encounter for care of pacemaker 02/13/2019   GERD (gastroesophageal reflux disease)    Hematemesis 09/16/2016   History of stomach ulcers 2001   Hypertension    LBBB (left bundle branch block)    Lower extremity edema    Mild intermittent asthma without complication    Mixed hyperlipidemia    Mobitz type 2 second degree AV block 04/06/2019   Neuropathy    Pacemaker: Medtronic Azure XT DR MRI [id number] PPM -  BUNDLE OF HIS pacing  02/26/2017   Scheduled Remote pacemaker check  11/12/2018:  There were 24 Fast AV episodes:  EGMs show SVTs. Episodes lasted < 2 minutes. Health trends do not demonstrate significant abnormality. Battery longevity is 9.4 - 10.3 years. RA pacing is 47.1 %, RV pacing is 40.4 %.  Clinic check 11/07/17.    Paroxysmal atrial flutter ([name])    PONV (postoperative nausea and vomiting)    Prostatitis    Recurrent upper respiratory infection (URI)    Schizophrenia ([name])    Sinus node dysfunction ([name]) 02/13/2019   Urticaria     Past Surgical History:   Procedure Laterality Date   ADENOIDECTOMY     APPENDECTOMY     COLONOSCOPY     CORONARY STENT INTERVENTION N/A 05/26/2022   Procedure: CORONARY STENT INTERVENTION;  Surgeon: Corky Crafts, MD;  Location: [facility] INVASIVE CV LAB;  Service: Cardiovascular;  Laterality: N/A;   CORONARY ULTRASOUND/IVUS N/A 05/26/2022   Procedure: Intravascular Ultrasound/IVUS;  Surgeon: Corky Crafts, MD;  Location: [facility] INVASIVE CV LAB;  Service: Cardiovascular;  Laterality: N/A;   ELECTROPHYSIOLOGY STUDY N/A 02/26/2017   Procedure: ELECTROPHYSIOLOGY STUDY;  Surgeon: Marinus Maw, MD;  Location: [facility] INVASIVE CV LAB;  Service: Cardiovascular;  Laterality: N/A;   ESOPHAGOGASTRODUODENOSCOPY (EGD) WITH PROPOFOL N/A 09/18/2016   Procedure: ESOPHAGOGASTRODUODENOSCOPY (EGD) WITH PROPOFOL;  Surgeon: Kerin Salen, MD;  Location: [facility] ENDOSCOPY;  Service: Gastroenterology;  Laterality: N/A;   LEFT HEART CATH AND CORONARY ANGIOGRAPHY N/A 05/26/2022   Procedure: LEFT HEART CATH AND CORONARY ANGIOGRAPHY;  Surgeon: Corky Crafts, MD;  Location: [facility] INVASIVE CV LAB;  Service: Cardiovascular;  Laterality: N/A;   LUMBAR LAMINECTOMY/DECOMPRESSION MICRODISCECTOMY  04/19/2011   Procedure: LUMBAR LAMINECTOMY/DECOMPRESSION MICRODISCECTOMY;  Surgeon: Jacki Cones;  Location: [facility] ORS;  Service: Orthopedics;  Laterality: Left;  Hemi LAminectomy/Microdiscectomy Lumbar four  - Lumbar five  on the Left (X-Ray)   PACEMAKER IMPLANT N/A 02/26/2017  Procedure: PACEMAKER IMPLANT;  Surgeon: Marinus Maw, MD;  Location: [facility] INVASIVE CV LAB;  Service: Cardiovascular;  Laterality: N/A;   TONSILLECTOMY     UPPER GASTROINTESTINAL ENDOSCOPY      Family History  Problem Relation Age of Onset   High blood pressure Mother    Alzheimer's disease Father    Heart attack Brother    Colon cancer Neg Hx    Esophageal cancer Neg Hx    Rectal cancer Neg Hx    Stomach cancer Neg Hx     Social History:  reports that he quit smoking about  14 years ago. His smoking use included cigarettes. He has a 30.00 pack-year smoking history. He has never used smokeless tobacco. He reports that he does not drink alcohol and does not use drugs.  Allergies:  Allergies  Allergen Reactions   Methylpyrrolidone Hives    froze the intestine   Niacin Itching, Nausea And Vomiting and Other (See Comments)    Flushing, itching, tingling    Ace Inhibitors     Other reaction(s): CKD 4   Norvasc [Amlodipine Besylate] Other (See Comments)    Swollen Feet   Other Other (See Comments)   Oxybutynin Chloride [Oxybutynin Chloride Er] Other (See Comments)    froze the intestine   Statins     Reports severe reaction but cannot remember exactly what it was    Vesicare [Solifenacin Succinate] Other (See Comments)    Froze the intestine    Ciprofloxacin Rash and Other (See Comments)    Felt flushed    Oxybutynin Rash and Other (See Comments)    bowel obst   Solifenacin Rash    Medications: I have reviewed the patient's current medications.  Results for orders placed or performed during the hospital encounter of 10/01/22 (from the past 48 hour(s))  Lipase, blood     Status: None   Collection Time: 10/01/22  4:20 AM  Result Value Ref Range   Lipase 32 11 - 51 U/L    Comment: Performed at [facility], [address] [place], [place] [address]  Comprehensive metabolic panel     Status: Abnormal   Collection Time: 10/01/22  4:20 AM  Result Value Ref Range   Sodium 133 (L) 135 - 145 mmol/L   Potassium 4.6 3.5 - 5.1 mmol/L   Chloride 100 98 - 111 mmol/L   CO2 25 22 - 32 mmol/L   Glucose, Bld 104 (H) 70 - 99 mg/dL    Comment: Glucose reference range applies only to samples taken after fasting for at least 8 hours.   BUN 25 (H) 8 - 23 mg/dL   Creatinine, Ser 9.14 (H) 0.61 - 1.24 mg/dL   Calcium 8.7 (L) 8.9 - 10.3 mg/dL   Total Protein 6.9 6.5 - 8.1 g/dL   Albumin 3.7 3.5 - 5.0 g/dL   AST 23 15 - 41 U/L   ALT 17 0 - 44 U/L   Alkaline  Phosphatase 66 38 - 126 U/L   Total Bilirubin 0.8 0.3 - 1.2 mg/dL   GFR, Estimated 38 (L) >60 mL/min    Comment: (NOTE) Calculated using the CKD-EPI Creatinine Equation (2021)    Anion gap 8 5 - 15    Comment: Performed at [facility], [address], [place], [place] [address]  CBC     Status: None   Collection Time: 10/01/22  4:20 AM  Result Value Ref Range   WBC 8.2 4.0 - 10.5 K/uL  RBC 4.78 4.22 - 5.81 MIL/uL   Hemoglobin 13.4 13.0 - 17.0 g/dL   HCT 16.1 09.6 - 04.5 %   MCV 88.9 80.0 - 100.0 fL   MCH 28.0 26.0 - 34.0 pg   MCHC 31.5 30.0 - 36.0 g/dL   RDW 40.9 81.1 - 91.4 %   Platelets 210 150 - 400 K/uL   nRBC 0.0 0.0 - 0.2 %    Comment: Performed at [facility], [address] [place], [place] [address]  Urinalysis, Routine w reflex microscopic -Urine, Clean Catch     Status: None   Collection Time: 10/01/22  4:20 AM  Result Value Ref Range   Color, Urine YELLOW YELLOW   APPearance CLEAR CLEAR   Specific Gravity, Urine 1.009 1.005 - 1.030   pH 6.0 5.0 - 8.0   Glucose, UA NEGATIVE NEGATIVE mg/dL   Hgb urine dipstick NEGATIVE NEGATIVE   Bilirubin Urine NEGATIVE NEGATIVE   Ketones, ur NEGATIVE NEGATIVE mg/dL   Protein, ur NEGATIVE NEGATIVE mg/dL   Nitrite NEGATIVE NEGATIVE   Leukocytes,Ua NEGATIVE NEGATIVE    Comment: Performed at [facility], [address] [place], [place] [address]    US Abdomen Limited RUQ (LIVER/GB)  Result Date: 10/01/2022 CLINICAL DATA:  Gallstones. EXAM: ULTRASOUND ABDOMEN LIMITED RIGHT UPPER QUADRANT COMPARISON:  CT from earlier today FINDINGS: Gallbladder: 5 mm stone identified within the gallbladder. The gallbladder appears partially decompressed with wall thickening measuring up to 6.6 mm. Positive sonographic Murphy's sign was reported by the sonographer. Common bile duct: Diameter: 3.1 Liver: No focal lesion identified. Within normal limits in parenchymal echogenicity. Portal vein is patent on color Doppler imaging with  normal direction of blood flow towards the liver. Other: Diminished exam detail due to overlying bowel gas. IMPRESSION: Gallstone with gallbladder wall thickening and positive sonographic Murphy's sign. Cannot exclude acute cholecystitis. Electronically Signed   By: Signa Kell M.D.   On: 10/01/2022 10:18   CT Angio Chest/Abd/Pel for Dissection W and/or W/WO  Result Date: 10/01/2022 CLINICAL DATA:  Acute aortic syndrome. EXAM: CT ANGIOGRAPHY CHEST, ABDOMEN AND PELVIS TECHNIQUE: Non-contrast CT of the chest was initially obtained. Multidetector CT imaging through the chest, abdomen and pelvis was performed using the standard protocol during bolus administration of intravenous contrast. Multiplanar reconstructed images and MIPs were obtained and reviewed to evaluate the vascular anatomy. RADIATION DOSE REDUCTION: This exam was performed according to the departmental dose-optimization program which includes automated exposure control, adjustment of the mA and/or kV according to patient size and/or use of iterative reconstruction technique. CONTRAST:  OMNIPAQUE IOHEXOL 350 MG/ML SOLN COMPARISON:  Abdomen pelvis CT 10/21/2020 FINDINGS: CTA CHEST FINDINGS Cardiovascular: The heart size is normal. No substantial pericardial effusion. Coronary artery calcification is evident. Coronary stent device evident. Mild atherosclerotic calcification is noted in the wall of the thoracic aorta. Left-sided permanent pacemaker evident. Precontrast imaging shows no hyperdense crescent in the wall of the thoracic aorta to suggest the presence of an acute intramural hematoma. No thoracic aortic aneurysm. Imaging after IV contrast administration shows no dissection of the thoracic aorta. Sagittal imaging reveals a penetrating ulcer or ulcerated plaque along the under surface of the transverse aorta measuring on the order of 11 x 8 mm. This is also seen on axial image 43/6. Mediastinum/Nodes: No mediastinal lymphadenopathy. No  evidence for gross hilar lymphadenopathy although assessment is limited by the lack of intravenous contrast on the current study. The esophagus has normal imaging features. There is no axillary  lymphadenopathy. Lungs/Pleura: Centrilobular and paraseptal emphysema evident. No suspicious pulmonary nodule or mass. No focal airspace consolidation. No pleural effusion. Musculoskeletal: No worrisome lytic or sclerotic osseous abnormality. Review of the MIP images confirms the above findings. CTA ABDOMEN AND PELVIS FINDINGS VASCULAR Aorta: Infrarenal abdominal aorta measures 3.9 x 3.7 cm in maximum orthogonal diameter. Atherosclerotic changes evident with prominent mural thrombus in the aneurysmal segment. No dissection, vasculitis or significant stenosis. Celiac: Patent without evidence of aneurysm, dissection, vasculitis or significant stenosis. SMA: Patent without evidence of aneurysm, dissection, vasculitis or significant stenosis. Renals: Both renal arteries are patent without evidence of aneurysm, dissection, vasculitis, fibromuscular dysplasia or significant stenosis. IMA: Chronic occlusion Inflow: Patent without evidence of aneurysm, dissection, vasculitis or significant stenosis. Veins: No obvious venous abnormality within the limitations of this arterial phase study. Review of the MIP images confirms the above findings. NON-VASCULAR Hepatobiliary: No suspicious focal abnormality within the liver parenchyma. Gallbladder is decompressed which accentuates wall thickness gallbladder wall is ill-defined raising the question of pericholecystic edema. 6 mm gallstone evident. No intrahepatic or extrahepatic biliary dilation. Pancreas: Diffuse parenchymal calcification consistent with chronic pancreatitis. No main duct dilatation. Spleen: No splenomegaly. No focal mass lesion. Adrenals/Urinary Tract: No adrenal nodule or mass. Large central sinus cysts noted left kidney. There is dilatation of an upper pole calyx, similar  to CT scan from 09/25/2014, potentially due to mass-effect from the large central sinus cyst. Overlying cortical thinning right kidney with levin mm stone upper interpolar region and 10 mm stone in the lower interpolar region. No associated right hydroureter. Component of right UPJ obstruction cannot be excluded. 5.5 cm exophytic low-density lesion upper pole left kidney has increased from 4.7 cm on the 2022 exam. 15 mm subcapsular lesion upper pole left kidney is similar to prior. Both of these lesions have attenuation slightly higher than would be expected for simple cyst. 8 mm nonobstructing stone identified lower pole left kidney. Left ureter unremarkable. The urinary bladder appears normal for the degree of distention. Stomach/Bowel: Stomach is unremarkable. No gastric wall thickening. No evidence of outlet obstruction. Duodenum is normally positioned as is the ligament of Treitz. No small bowel wall thickening. No small bowel dilatation. The terminal ileum is normal. The appendix is not well visualized, but there is no edema or inflammation in the region of the cecum. No gross colonic mass. No colonic wall thickening. Lymphatic: There is no gastrohepatic or hepatoduodenal ligament lymphadenopathy. No retroperitoneal or mesenteric lymphadenopathy. No pelvic sidewall lymphadenopathy. Reproductive: The prostate gland and seminal vesicles are unremarkable. Other: No intraperitoneal free fluid. Musculoskeletal: No worrisome lytic or sclerotic osseous abnormality. Review of the MIP images confirms the above findings. IMPRESSION: 1. No evidence for acute intramural hematoma or dissection in the thoracoabdominal aorta. 2. 11 x 8 mm penetrating ulcer or ulcerated plaque along the under surface of the transverse aorta. 3. 3.9 x 3.7 cm infrarenal abdominal aortic aneurysm. 4. Gallbladder is decompressed which accentuates wall thickness. Gallbladder wall is ill-defined raising the question of pericholecystic edema. There  is a 6 mm gallstone evident. Right upper quadrant ultrasound may prove helpful to further evaluate. 5. Large central sinus cyst left kidney with dilatation of an upper pole calyx, similar to CT scan from 09/25/2014, potentially due to mass-effect from the large central sinus cyst. Component of right UPJ obstruction cannot be excluded. 6. Bilateral nonobstructing nephrolithiasis. 7. 5.5 cm exophytic low-density lesion upper pole left kidney has increased from 4.7 cm on the 2022 exam. Another smaller upper pole left renal  lesion is also mildly progressed. Both of these lesions have attenuation slightly higher than would be expected for simple cysts. While likely cysts complicated by proteinaceous debris or hemorrhage. Given the presence of permanent pacemaker, follow-up CT abdomen with and without contrast may be warranted to exclude hypoenhancing neoplasm. 8. Chronic pancreatitis. 9.  Emphysema (ICD10-J43.9). Electronically Signed   By: Kennith Center M.D.   On: 10/01/2022 08:51   US Aorta  Result Date: 10/01/2022 CLINICAL DATA:  Abdominal pain. History of abdominal aortic aneurysm. EXAM: ULTRASOUND OF ABDOMINAL AORTA TECHNIQUE: Ultrasound examination of the abdominal aorta and proximal common iliac arteries was performed to evaluate for aneurysm. Additional color and Doppler images of the distal aorta were obtained to document patency. COMPARISON:  CT abdomen/pelvis 10/21/2020 FINDINGS: Abdominal aortic measurements as follows: Proximal:  2.7 x 2.9 cm Mid:  2.1 x 2.0 cm Distal:  3.5 x 3.9 cm Patent: Yes, peak systolic velocity is 63.6 cm/s Right common iliac artery: 1.0 x 1.1 cm Left common iliac artery: 0.7 x 1.1 cm IMPRESSION: 3.9 cm abdominal aortic aneurysm. Infrarenal abdominal aorta was 3.4 cm maximum diameter when remeasured on the study from 10/21/2020 compatible with mild interval progression. Ultrasound can be insensitive for acute aortic aneurysm leak and dissection. Recommend follow-up ultrasound  every 2 years. This recommendation follows ACR consensus guidelines: White Paper of the ACR Incidental Findings Committee II on Vascular Findings. J Am Coll Radiol 2013; 10:789-794. Electronically Signed   By: Kennith Center M.D.   On: 10/01/2022 07:30    ROS  PE Blood pressure (!) 191/112, pulse (!) 59, temperature 97.8 F (36.6 C), temperature source Oral, resp. rate 19, height 6\' 1"  (1.854 m), weight 83.9 kg, SpO2 99 %. Constitutional: NAD; conversant; no deformities Eyes: Moist conjunctiva; no lid lag; anicteric; PERRL Neck: Trachea midline; no thyromegaly Lungs: Normal respiratory effort; no tactile fremitus CV: RRR; no palpable thrills; no pitting edema GI: Abd tender to palpation right side; no palpable hepatosplenomegaly MSK: Normal gait; no clubbing/cyanosis Psychiatric: Appropriate affect; alert and oriented x3 Lymphatic: No palpable cervical or axillary lymphadenopathy Skin: No major subcutaneous nodules. Warm and dry   Assessment/Plan: 62 yo male with multiple medical problems with concern for acute calculous cholecystitis. Stent placed in 05/2022 -IV abx -bowel rest -cardiology consult for risk assessment and recs on antiplatelet therapy hold and timing of surgery -ideally surgery during hospitalization, but if unable to come off plavix, may be better for cholecystostomy tube. -We discussed the etiology of his pain, we discussed treatment options and recommended surgery. We discussed details of surgery including general anesthesia, laparoscopic approach, identification of cystic duct and common bile duct. Ligation of cystic duct and cystic artery. Possible need for intraoperative cholangiogram or open procedure. Possible risks of common bile duct injury, liver injury, cystic duct leak, bleeding, infection, post-cholecystectomy syndrome. The patient showed good understanding and all questions were answered   I reviewed last 24 h vitals and pain scores, last 48 h intake and  output, last 24 h labs and trends, and last 24 h imaging results.  This care required high  level of medical decision making.   De Blanch Dorrie Cocuzza 10/01/2022, 3:29 PM

## 2022-10-01 NOTE — ED Notes (Signed)
ED TO INPATIENT HANDOFF REPORT  ED Nurse Name and Phone #: Nicholos Johns [phone]  S Name/Age/Gender Peter Marsh 62 y.o. male Room/Bed: 032C/032C  Code Status   Code Status: Full Code  Home/SNF/Other Home Patient oriented to: self, place, time, and situation Is this baseline? Yes   Triage Complete: Triage complete  Chief Complaint Acute cholecystitis [K81.0]  Triage Note Patient has multiple complaints , c/o abd. Pain onset 3-4 days ago  states he has an abd. Aneurysm was suppose to have ct of his abd. Last week however didn't have to up front money to do so. States his has  a cyst on his kidney and is on the renal transplant list . C/o n/v/d. States the abd. Pain comes and goes.    Allergies Allergies  Allergen Reactions   Methylpyrrolidone Hives    froze the intestine   Niacin Itching, Nausea And Vomiting and Other (See Comments)    Flushing, itching, tingling    Ace Inhibitors     Other reaction(s): CKD 4   Norvasc [Amlodipine Besylate] Other (See Comments)    Swollen Feet   Other Other (See Comments)   Oxybutynin Chloride [Oxybutynin Chloride Er] Other (See Comments)    froze the intestine   Statins     Reports severe reaction but cannot remember exactly what it was    Vesicare [Solifenacin Succinate] Other (See Comments)    Froze the intestine    Ciprofloxacin Rash and Other (See Comments)    Felt flushed    Oxybutynin Rash and Other (See Comments)    bowel obst   Solifenacin Rash    Level of Care/Admitting Diagnosis ED Disposition     ED Disposition  Admit   Condition  --   Comment  Hospital Area: [facility] [[id number]]  Level of Care: Telemetry Surgical [[id number]]  May place patient in observation at [facility] or [facility] if equivalent level of care is available:: No  Covid Evaluation: Asymptomatic - no recent exposure (last 10 days) testing not required  Diagnosis: Acute cholecystitis [575.0.ICD-9-CM]  Admitting Physician: Synetta Fail [[id number]]  Attending Physician: Synetta Fail [[id number]]          B Medical/Surgery History Past Medical History:  Diagnosis Date   Allergy    Anxiety    Asthma    uses inhalers    Bilateral carotid bruits    Cardiac conduction disorder 2018   s/p MDT PPM   Cataract    Chest pain 06/24/2018   Chronic kidney disease    bladder interstial cystitis    Chronic kidney disease (CKD), stage IV (severe) ([name])    followed by Dr. Marisue Humble at [facility]   COPD (chronic obstructive pulmonary disease) ([name])    Coronary artery disease    COVID-19 virus infection 10/21/2020   Depression    Diabetes mellitus without complication ([name])    Discord with neighbors, lodgers and landlord 11/26/2021   Encounter for care of pacemaker 02/13/2019   GERD (gastroesophageal reflux disease)    Hematemesis 09/16/2016   History of stomach ulcers 2001   Hypertension    LBBB (left bundle branch block)    Lower extremity edema    Mild intermittent asthma without complication    Mixed hyperlipidemia    Mobitz type 2 second degree AV block 04/06/2019   Neuropathy    Pacemaker: Medtronic Azure XT DR MRI [id number] PPM -  BUNDLE OF HIS pacing  02/26/2017   Scheduled Remote pacemaker check  11/12/2018:  There were 24 Fast AV episodes:  EGMs show SVTs. Episodes lasted < 2 minutes. Health trends do not demonstrate significant abnormality. Battery longevity is 9.4 - 10.3 years. RA pacing is 47.1 %, RV pacing is 40.4 %.  Clinic check 11/07/17.    Paroxysmal atrial flutter ([name])    PONV (postoperative nausea and vomiting)    Prostatitis    Recurrent upper respiratory infection (URI)    Schizophrenia ([name])    Sinus node dysfunction ([name]) 02/13/2019   Urticaria    Past Surgical History:  Procedure Laterality Date   ADENOIDECTOMY     APPENDECTOMY     COLONOSCOPY     CORONARY STENT INTERVENTION N/A 05/26/2022   Procedure: CORONARY STENT INTERVENTION;  Surgeon: Corky Crafts, MD;   Location: [facility] INVASIVE CV LAB;  Service: Cardiovascular;  Laterality: N/A;   CORONARY ULTRASOUND/IVUS N/A 05/26/2022   Procedure: Intravascular Ultrasound/IVUS;  Surgeon: Corky Crafts, MD;  Location: [facility] INVASIVE CV LAB;  Service: Cardiovascular;  Laterality: N/A;   ELECTROPHYSIOLOGY STUDY N/A 02/26/2017   Procedure: ELECTROPHYSIOLOGY STUDY;  Surgeon: Marinus Maw, MD;  Location: [facility] INVASIVE CV LAB;  Service: Cardiovascular;  Laterality: N/A;   ESOPHAGOGASTRODUODENOSCOPY (EGD) WITH PROPOFOL N/A 09/18/2016   Procedure: ESOPHAGOGASTRODUODENOSCOPY (EGD) WITH PROPOFOL;  Surgeon: Kerin Salen, MD;  Location: [facility] ENDOSCOPY;  Service: Gastroenterology;  Laterality: N/A;   LEFT HEART CATH AND CORONARY ANGIOGRAPHY N/A 05/26/2022   Procedure: LEFT HEART CATH AND CORONARY ANGIOGRAPHY;  Surgeon: Corky Crafts, MD;  Location: [facility] INVASIVE CV LAB;  Service: Cardiovascular;  Laterality: N/A;   LUMBAR LAMINECTOMY/DECOMPRESSION MICRODISCECTOMY  04/19/2011   Procedure: LUMBAR LAMINECTOMY/DECOMPRESSION MICRODISCECTOMY;  Surgeon: Jacki Cones;  Location: [facility] ORS;  Service: Orthopedics;  Laterality: Left;  Hemi LAminectomy/Microdiscectomy Lumbar four  - Lumbar five  on the Left (X-Ray)   PACEMAKER IMPLANT N/A 02/26/2017   Procedure: PACEMAKER IMPLANT;  Surgeon: Marinus Maw, MD;  Location: [facility] INVASIVE CV LAB;  Service: Cardiovascular;  Laterality: N/A;   TONSILLECTOMY     UPPER GASTROINTESTINAL ENDOSCOPY       A IV Location/Drains/Wounds Patient Lines/Drains/Airways Status     Active Line/Drains/Airways     Name Placement date Placement time Site Days   Peripheral IV 10/01/22 Right Antecubital 10/01/22  0506  Antecubital  less than 1            Intake/Output Last 24 hours No intake or output data in the 24 hours ending 10/01/22 1255  Labs/Imaging Results for orders placed or performed during the hospital encounter of 10/01/22 (from the past 48 hour(s))  Lipase, blood     Status: None    Collection Time: 10/01/22  4:20 AM  Result Value Ref Range   Lipase 32 11 - 51 U/L    Comment: Performed at [facility], [address], [place], [place] [address]  Comprehensive metabolic panel     Status: Abnormal   Collection Time: 10/01/22  4:20 AM  Result Value Ref Range   Sodium 133 (L) 135 - 145 mmol/L   Potassium 4.6 3.5 - 5.1 mmol/L   Chloride 100 98 - 111 mmol/L   CO2 25 22 - 32 mmol/L   Glucose, Bld 104 (H) 70 - 99 mg/dL    Comment: Glucose reference range applies only to samples taken after fasting for at least 8 hours.   BUN 25 (H) 8 - 23 mg/dL   Creatinine, Ser 6.04 (H) 0.61 - 1.24 mg/dL   Calcium 8.7 (L) 8.9 -  10.3 mg/dL   Total Protein 6.9 6.5 - 8.1 g/dL   Albumin 3.7 3.5 - 5.0 g/dL   AST 23 15 - 41 U/L   ALT 17 0 - 44 U/L   Alkaline Phosphatase 66 38 - 126 U/L   Total Bilirubin 0.8 0.3 - 1.2 mg/dL   GFR, Estimated 38 (L) >60 mL/min    Comment: (NOTE) Calculated using the CKD-EPI Creatinine Equation (2021)    Anion gap 8 5 - 15    Comment: Performed at [facility], [address], [place], [place] [address]  CBC     Status: None   Collection Time: 10/01/22  4:20 AM  Result Value Ref Range   WBC 8.2 4.0 - 10.5 K/uL   RBC 4.78 4.22 - 5.81 MIL/uL   Hemoglobin 13.4 13.0 - 17.0 g/dL   HCT 60.4 54.0 - 98.1 %   MCV 88.9 80.0 - 100.0 fL   MCH 28.0 26.0 - 34.0 pg   MCHC 31.5 30.0 - 36.0 g/dL   RDW 19.1 47.8 - 29.5 %   Platelets 210 150 - 400 K/uL   nRBC 0.0 0.0 - 0.2 %    Comment: Performed at [facility], [address], [place], [place] [address]  Urinalysis, Routine w reflex microscopic -Urine, Clean Catch     Status: None   Collection Time: 10/01/22  4:20 AM  Result Value Ref Range   Color, Urine YELLOW YELLOW   APPearance CLEAR CLEAR   Specific Gravity, Urine 1.009 1.005 - 1.030   pH 6.0 5.0 - 8.0   Glucose, UA NEGATIVE NEGATIVE mg/dL   Hgb urine dipstick NEGATIVE NEGATIVE   Bilirubin Urine NEGATIVE NEGATIVE   Ketones, ur  NEGATIVE NEGATIVE mg/dL   Protein, ur NEGATIVE NEGATIVE mg/dL   Nitrite NEGATIVE NEGATIVE   Leukocytes,Ua NEGATIVE NEGATIVE    Comment: Performed at [facility], [address], [place], [place] [address]   US Abdomen Limited RUQ (LIVER/GB)  Result Date: 10/01/2022 CLINICAL DATA:  Gallstones. EXAM: ULTRASOUND ABDOMEN LIMITED RIGHT UPPER QUADRANT COMPARISON:  CT from earlier today FINDINGS: Gallbladder: 5 mm stone identified within the gallbladder. The gallbladder appears partially decompressed with wall thickening measuring up to 6.6 mm. Positive sonographic Murphy's sign was reported by the sonographer. Common bile duct: Diameter: 3.1 Liver: No focal lesion identified. Within normal limits in parenchymal echogenicity. Portal vein is patent on color Doppler imaging with normal direction of blood flow towards the liver. Other: Diminished exam detail due to overlying bowel gas. IMPRESSION: Gallstone with gallbladder wall thickening and positive sonographic Murphy's sign. Cannot exclude acute cholecystitis. Electronically Signed   By: Signa Kell M.D.   On: 10/01/2022 10:18   CT Angio Chest/Abd/Pel for Dissection W and/or W/WO  Result Date: 10/01/2022 CLINICAL DATA:  Acute aortic syndrome. EXAM: CT ANGIOGRAPHY CHEST, ABDOMEN AND PELVIS TECHNIQUE: Non-contrast CT of the chest was initially obtained. Multidetector CT imaging through the chest, abdomen and pelvis was performed using the standard protocol during bolus administration of intravenous contrast. Multiplanar reconstructed images and MIPs were obtained and reviewed to evaluate the vascular anatomy. RADIATION DOSE REDUCTION: This exam was performed according to the departmental dose-optimization program which includes automated exposure control, adjustment of the mA and/or kV according to patient size and/or use of iterative reconstruction technique. CONTRAST:  OMNIPAQUE IOHEXOL 350 MG/ML SOLN COMPARISON:  Abdomen pelvis CT 10/21/2020  FINDINGS: CTA CHEST FINDINGS Cardiovascular: The heart size is normal. No substantial pericardial effusion. Coronary artery  calcification is evident. Coronary stent device evident. Mild atherosclerotic calcification is noted in the wall of the thoracic aorta. Left-sided permanent pacemaker evident. Precontrast imaging shows no hyperdense crescent in the wall of the thoracic aorta to suggest the presence of an acute intramural hematoma. No thoracic aortic aneurysm. Imaging after IV contrast administration shows no dissection of the thoracic aorta. Sagittal imaging reveals a penetrating ulcer or ulcerated plaque along the under surface of the transverse aorta measuring on the order of 11 x 8 mm. This is also seen on axial image 43/6. Mediastinum/Nodes: No mediastinal lymphadenopathy. No evidence for gross hilar lymphadenopathy although assessment is limited by the lack of intravenous contrast on the current study. The esophagus has normal imaging features. There is no axillary lymphadenopathy. Lungs/Pleura: Centrilobular and paraseptal emphysema evident. No suspicious pulmonary nodule or mass. No focal airspace consolidation. No pleural effusion. Musculoskeletal: No worrisome lytic or sclerotic osseous abnormality. Review of the MIP images confirms the above findings. CTA ABDOMEN AND PELVIS FINDINGS VASCULAR Aorta: Infrarenal abdominal aorta measures 3.9 x 3.7 cm in maximum orthogonal diameter. Atherosclerotic changes evident with prominent mural thrombus in the aneurysmal segment. No dissection, vasculitis or significant stenosis. Celiac: Patent without evidence of aneurysm, dissection, vasculitis or significant stenosis. SMA: Patent without evidence of aneurysm, dissection, vasculitis or significant stenosis. Renals: Both renal arteries are patent without evidence of aneurysm, dissection, vasculitis, fibromuscular dysplasia or significant stenosis. IMA: Chronic occlusion Inflow: Patent without evidence of aneurysm,  dissection, vasculitis or significant stenosis. Veins: No obvious venous abnormality within the limitations of this arterial phase study. Review of the MIP images confirms the above findings. NON-VASCULAR Hepatobiliary: No suspicious focal abnormality within the liver parenchyma. Gallbladder is decompressed which accentuates wall thickness gallbladder wall is ill-defined raising the question of pericholecystic edema. 6 mm gallstone evident. No intrahepatic or extrahepatic biliary dilation. Pancreas: Diffuse parenchymal calcification consistent with chronic pancreatitis. No main duct dilatation. Spleen: No splenomegaly. No focal mass lesion. Adrenals/Urinary Tract: No adrenal nodule or mass. Large central sinus cysts noted left kidney. There is dilatation of an upper pole calyx, similar to CT scan from 09/25/2014, potentially due to mass-effect from the large central sinus cyst. Overlying cortical thinning right kidney with levin mm stone upper interpolar region and 10 mm stone in the lower interpolar region. No associated right hydroureter. Component of right UPJ obstruction cannot be excluded. 5.5 cm exophytic low-density lesion upper pole left kidney has increased from 4.7 cm on the 2022 exam. 15 mm subcapsular lesion upper pole left kidney is similar to prior. Both of these lesions have attenuation slightly higher than would be expected for simple cyst. 8 mm nonobstructing stone identified lower pole left kidney. Left ureter unremarkable. The urinary bladder appears normal for the degree of distention. Stomach/Bowel: Stomach is unremarkable. No gastric wall thickening. No evidence of outlet obstruction. Duodenum is normally positioned as is the ligament of Treitz. No small bowel wall thickening. No small bowel dilatation. The terminal ileum is normal. The appendix is not well visualized, but there is no edema or inflammation in the region of the cecum. No gross colonic mass. No colonic wall thickening. Lymphatic:  There is no gastrohepatic or hepatoduodenal ligament lymphadenopathy. No retroperitoneal or mesenteric lymphadenopathy. No pelvic sidewall lymphadenopathy. Reproductive: The prostate gland and seminal vesicles are unremarkable. Other: No intraperitoneal free fluid. Musculoskeletal: No worrisome lytic or sclerotic osseous abnormality. Review of the MIP images confirms the above findings. IMPRESSION: 1. No evidence for acute intramural hematoma or dissection in the thoracoabdominal aorta.  2. 11 x 8 mm penetrating ulcer or ulcerated plaque along the under surface of the transverse aorta. 3. 3.9 x 3.7 cm infrarenal abdominal aortic aneurysm. 4. Gallbladder is decompressed which accentuates wall thickness. Gallbladder wall is ill-defined raising the question of pericholecystic edema. There is a 6 mm gallstone evident. Right upper quadrant ultrasound may prove helpful to further evaluate. 5. Large central sinus cyst left kidney with dilatation of an upper pole calyx, similar to CT scan from 09/25/2014, potentially due to mass-effect from the large central sinus cyst. Component of right UPJ obstruction cannot be excluded. 6. Bilateral nonobstructing nephrolithiasis. 7. 5.5 cm exophytic low-density lesion upper pole left kidney has increased from 4.7 cm on the 2022 exam. Another smaller upper pole left renal lesion is also mildly progressed. Both of these lesions have attenuation slightly higher than would be expected for simple cysts. While likely cysts complicated by proteinaceous debris or hemorrhage. Given the presence of permanent pacemaker, follow-up CT abdomen with and without contrast may be warranted to exclude hypoenhancing neoplasm. 8. Chronic pancreatitis. 9.  Emphysema (ICD10-J43.9). Electronically Signed   By: Kennith Center M.D.   On: 10/01/2022 08:51   US Aorta  Result Date: 10/01/2022 CLINICAL DATA:  Abdominal pain. History of abdominal aortic aneurysm. EXAM: ULTRASOUND OF ABDOMINAL AORTA TECHNIQUE:  Ultrasound examination of the abdominal aorta and proximal common iliac arteries was performed to evaluate for aneurysm. Additional color and Doppler images of the distal aorta were obtained to document patency. COMPARISON:  CT abdomen/pelvis 10/21/2020 FINDINGS: Abdominal aortic measurements as follows: Proximal:  2.7 x 2.9 cm Mid:  2.1 x 2.0 cm Distal:  3.5 x 3.9 cm Patent: Yes, peak systolic velocity is 63.6 cm/s Right common iliac artery: 1.0 x 1.1 cm Left common iliac artery: 0.7 x 1.1 cm IMPRESSION: 3.9 cm abdominal aortic aneurysm. Infrarenal abdominal aorta was 3.4 cm maximum diameter when remeasured on the study from 10/21/2020 compatible with mild interval progression. Ultrasound can be insensitive for acute aortic aneurysm leak and dissection. Recommend follow-up ultrasound every 2 years. This recommendation follows ACR consensus guidelines: White Paper of the ACR Incidental Findings Committee II on Vascular Findings. J Am Coll Radiol 2013; 10:789-794. Electronically Signed   By: Kennith Center M.D.   On: 10/01/2022 07:30    Pending Labs Unresulted Labs (From admission, onward)     Start     Ordered   10/02/22 0500  Comprehensive metabolic panel  Tomorrow morning,   R        10/01/22 1229   10/02/22 0500  CBC  Tomorrow morning,   R        10/01/22 1229            Vitals/Pain Today's Vitals   10/01/22 1115 10/01/22 1130 10/01/22 1200 10/01/22 1245  BP:  (!) 196/114 (!) 189/112 (!) 174/110  Pulse: 60 66 (!) 58 (!) 56  Resp: 19 17 17 13   Temp:      TempSrc:      SpO2: 100% 100% 97% 94%  Weight:      Height:      PainSc:        Isolation Precautions No active isolations  Medications Medications  aspirin chewable tablet 81 mg (has no administration in time range)  cloNIDine (CATAPRES) tablet 0.2 mg (has no administration in time range)  ezetimibe (ZETIA) tablet 10 mg (has no administration in time range)  hydrALAZINE (APRESOLINE) tablet 100 mg (has no administration in  time range)  isosorbide dinitrate (ISORDIL)  tablet 30 mg (has no administration in time range)  metoprolol succinate (TOPROL-XL) 24 hr tablet 100 mg (has no administration in time range)  buPROPion (WELLBUTRIN SR) 12 hr tablet 150 mg (has no administration in time range)  paliperidone (INVEGA) 24 hr tablet 6 mg (has no administration in time range)  pantoprazole (PROTONIX) EC tablet 40 mg (has no administration in time range)  sucralfate (CARAFATE) 1 GM/10ML suspension 1 g (has no administration in time range)  clopidogrel (PLAVIX) tablet 75 mg (has no administration in time range)  montelukast (SINGULAIR) tablet 10 mg (has no administration in time range)  Budeson-Glycopyrrol-Formoterol 160-9-4.8 MCG/ACT AERO 2 puff (has no administration in time range)  albuterol (VENTOLIN HFA) 108 (90 Base) MCG/ACT inhaler 2 puff (has no administration in time range)  sodium chloride flush (NS) 0.9 % injection 3 mL (has no administration in time range)  acetaminophen (TYLENOL) tablet 650 mg (has no administration in time range)    Or  acetaminophen (TYLENOL) suppository 650 mg (has no administration in time range)  polyethylene glycol (MIRALAX / GLYCOLAX) packet 17 g (has no administration in time range)  0.9 %  sodium chloride infusion (has no administration in time range)  iohexol (OMNIPAQUE) 350 MG/ML injection 100 mL (100 mLs Intravenous Contrast Given 10/01/22 0808)  sodium chloride 0.9 % bolus 500 mL (500 mLs Intravenous New Bag/Given 10/01/22 1109)  ondansetron (ZOFRAN) injection 4 mg (4 mg Intravenous Given 10/01/22 1110)    Mobility walks     Focused Assessments Cardiac Assessment Handoff:    Lab Results  Component Value Date   CKTOTAL 73 01/10/2010   CKMB 1.0 01/10/2010   TROPONINI <0.03 06/24/2018   Lab Results  Component Value Date   DDIMER 1.84 (H) 10/25/2020   Does the Patient currently have chest pain? No   ,    R Recommendations: See Admitting Provider Note  Report  given to:   Additional Notes: ambulatory, continent, very pleasant. Would like to eat if surgery is not going to be until tomorrow

## 2022-10-01 NOTE — ED Provider Notes (Signed)
Physical Exam  BP (!) 196/114   Pulse 66   Temp 98.1 F (36.7 C)   Resp 17   Ht 6\' 1"  (1.854 m)   Wt 83.9 kg   SpO2 100%   BMI 24.41 kg/m   Physical Exam Vitals and nursing note reviewed.  Constitutional:      General: He is not in acute distress.    Appearance: Normal appearance. He is normal weight. He is not ill-appearing.  HENT:     Head: Normocephalic and atraumatic.  Pulmonary:     Effort: Pulmonary effort is normal. No respiratory distress.  Abdominal:     General: Abdomen is flat.     Palpations: Abdomen is soft.     Tenderness: There is abdominal tenderness in the right upper quadrant.     Comments: No pulsatile mass or abdominal bruit.  Bowel sounds normal.  Musculoskeletal:        General: Normal range of motion.     Cervical back: Neck supple.  Skin:    General: Skin is warm and dry.  Neurological:     Mental Status: He is alert and oriented to person, place, and time.  Psychiatric:        Mood and Affect: Mood normal.        Behavior: Behavior normal.     Procedures  Procedures  ED Course / MDM   Clinical Course as of 10/01/22 1235  Sun Oct 01, 2022  0540 + hemoccult (exam w light brown stool no BRBPR) [WF]  1145 Spoke with general surgery Dr. Sheliah Hatch who will consult. Requests admission to hospitalist [AS]  1159 Family medicine residency program states they are capped and cannot admit [AS]    Clinical Course User Index [AS] Weldon Nouri, Edsel Petrin, PA-C [WF] Gailen Shelter, [place]   Medical Decision Making Amount and/or Complexity of Data Reviewed Labs: ordered. Radiology: ordered.  Assumed care at shift change from [facility], PA-C. Please see his note for full HPI. In short, 62 year old male presenting with epigastric and midline pain which woke him up from his sleep this morning. Has a known abdominal aortic aneurysm, was told recently it increased in size from 3.2 cm to 4 cm. Pain resolved prior to arrival in the ED. Plan is to obtain US  of aorta to assess for worsening aneurysm or dissection. Will likely be able to go home if [place] is negative.  0730- [place] reassuring. Patient has had nausea, vomiting and diarrhea for 2 days.  States he ate some suspicious foods prior to symptom onset and he believes this is contributing to his symptoms.  States he was unable to eat at all yesterday.  Currently states he is back to baseline.  He denies any abdominal pain, nausea or other symptoms.  1130-CT dissection study revealed gallbladder wall thickening with recommendation for right upper quadrant ultrasound.  Right upper quadrant ultrasound performed and revealed gallbladder wall thickening, cholelithiasis and positive sonographic Murphy sign.  Concerning presentation for cholecystitis.  General surgery was consulted and will evaluate the patient.  Recommends admission to hospitalist.  1230- Hospitalist consulted and will admit.  Hypertensive but otherwise stable at the time of admission.  Patient's case discussed with Dr. Durwin Nora who agrees with plan to admit.   Note: Portions of this report may have been transcribed using voice recognition software. Every effort was made to ensure accuracy; however, inadvertent computerized transcription errors may still be present.       Elfie Costanza, Edsel Petrin, PA-C  10/01/22 1235    Gloris Manchester, MD 10/03/22 [phone]

## 2022-10-01 NOTE — ED Provider Notes (Signed)
[place] EMERGENCY DEPARTMENT AT [facility] Provider Note   CSN: [id number] Arrival date & time: 10/01/22  0351     History  Chief Complaint  Patient presents with   Abdominal Pain    Peter Marsh is a 62 y.o. male.   Abdominal Pain  Patient is a 62 year old male with past medical history significant for CAD, HLD, ischemic cardiomyopathy, LBBB, HTN, paroxysmal A-fib, pacemaker placed in 2018, AAA, he has DES stents to the proximal LAD from January 2024, most recent echocardiogram in January showed EF of 40 to 45% He is on Plavix and aspirin  Patient presents emergency room with complaints of 1 week of intermittent epigastric/umbilical abdominal pain he states it will come and go without provocation.  Seems to last for minutes at times and sometimes quite a bit longer.  He came to emergency room today because he was woken up at 3 AM by the pain and states it was constant for over an hour he came to the emergency room and is now experience resolution of his pain.  He has had intermittent nausea and nonbloody nonbilious emesis.  No bowel movement in 4 days.  He states that he has had some BRBPR but nothing else has come out of his anus since 4 days ago.   Notably patient was diagnosed with a AAA     Home Medications Prior to Admission medications   Medication Sig Start Date End Date Taking? Authorizing Provider  albuterol (VENTOLIN HFA) 108 (90 Base) MCG/ACT inhaler INHALE 2 PUFFS INTO THE LUNGS EVERY 6 HOURS AS NEEDED FOR WHEEZING OR SHORTNESS OF BREATH 07/11/21   Nehemiah Settle, FNP  aspirin 81 MG chewable tablet Chew 1 tablet (81 mg total) by mouth daily. 05/28/22   Osvaldo Shipper, MD  Budeson-Glycopyrrol-Formoterol (BREZTRI AEROSPHERE) 160-9-4.8 MCG/ACT AERO Inhale 2 puffs into the lungs in the morning and at bedtime. 12/09/20   Hetty Blend, FNP  buPROPion (WELLBUTRIN SR) 150 MG 12 hr tablet Take 150 mg by mouth 2 (two) times daily.    [provider]   cetirizine (ZYRTEC) 10 MG tablet Take 1 tablet (10 mg total) by mouth daily. 12/09/20   Hetty Blend, FNP  cloNIDine (CATAPRES) 0.2 MG tablet Take 1 tablet (0.2 mg total) by mouth 3 (three) times daily. 09/19/22   O'NealRonnald Ramp, MD  clopidogrel (PLAVIX) 75 MG tablet Take 1 tablet (75 mg total) by mouth daily. 08/21/22 08/21/23  O'NealRonnald Ramp, MD  docusate sodium (COLACE) 100 MG capsule Take 1 capsule (100 mg total) by mouth 2 (two) times daily. 10/25/20   Leroy Sea, MD  ezetimibe (ZETIA) 10 MG tablet Take 1 tablet (10 mg total) by mouth daily. 07/28/22   Sande Rives, MD  fluticasone (FLONASE) 50 MCG/ACT nasal spray Place 1 spray into both nostrils daily as needed for allergies. 09/08/20   Hetty Blend, FNP  hydrALAZINE (APRESOLINE) 100 MG tablet Take 1 tablet (100 mg total) by mouth 3 (three) times daily. 09/19/22   O'NealRonnald Ramp, MD  isosorbide dinitrate (ISORDIL) 30 MG tablet TAKE 1 TABLET(30 MG) BY MOUTH THREE TIMES DAILY 09/19/22   Ronney Asters, NP  metoprolol succinate (TOPROL-XL) 100 MG 24 hr tablet Take 1 tablet (100 mg total) by mouth 2 (two) times daily. Take with or immediately following a meal. 05/27/22   Osvaldo Shipper, MD  montelukast (SINGULAIR) 10 MG tablet Take 1 tablet (10 mg total) by mouth at bedtime. 12/09/20 05/27/23  Hetty Blend, FNP  nitroGLYCERIN (NITROSTAT) 0.4 MG SL tablet Place 0.4 mg under the tongue every 5 (five) minutes as needed for chest pain.    [provider]  ondansetron (ZOFRAN ODT) 4 MG disintegrating tablet Take 1 tablet (4 mg total) by mouth every 8 (eight) hours as needed for nausea or vomiting. 12/17/20   Tressia Danas, MD  paliperidone (INVEGA) 6 MG 24 hr tablet Take 6 mg by mouth at bedtime.    [provider]  pantoprazole (PROTONIX) 40 MG tablet TAKE 1 TABLET(40 MG) BY MOUTH TWICE DAILY Patient taking differently: Take 40 mg by mouth 2 (two) times daily. 07/06/21   Tressia Danas, MD   Spacer/Aero-Holding Rudean Curt Use as directed with inhaler. 07/12/21   Nehemiah Settle, FNP  sucralfate (CARAFATE) 1 GM/10ML suspension Take 10 mLs (1 g total) by mouth 4 (four) times daily -  with meals and at bedtime. Patient taking differently: Take 1 g by mouth daily as needed (for ulcers). 10/25/20   Leroy Sea, MD      Allergies    Methylpyrrolidone, Niacin, Ace inhibitors, Norvasc [amlodipine besylate], Other, Oxybutynin chloride [oxybutynin chloride er], Statins, Vesicare [solifenacin succinate], Ciprofloxacin, Oxybutynin, and Solifenacin    Review of Systems   Review of Systems  Gastrointestinal:  Positive for abdominal pain.    Physical Exam Updated Vital Signs BP (!) 171/100   Pulse 68   Temp 98 F (36.7 C) (Oral)   Resp (!) 22   Ht 6\' 1"  (1.854 m)   Wt 83.9 kg   SpO2 98%   BMI 24.41 kg/m  Physical Exam Vitals and nursing note reviewed.  Constitutional:      General: He is not in acute distress.    Comments: Somewhat pale 62 year old male appears chronically ill but in no acute distress.  HENT:     Head: Normocephalic and atraumatic.     Nose: Nose normal.  Eyes:     General: No scleral icterus. Cardiovascular:     Rate and Rhythm: Normal rate and regular rhythm.     Pulses: Normal pulses.     Heart sounds: Normal heart sounds.  Pulmonary:     Effort: Pulmonary effort is normal. No respiratory distress.     Breath sounds: No wheezing.  Abdominal:     Palpations: Abdomen is soft.     Tenderness: There is no abdominal tenderness. There is no guarding or rebound.  Musculoskeletal:     Cervical back: Normal range of motion.     Right lower leg: No edema.     Left lower leg: No edema.  Skin:    General: Skin is warm and dry.     Capillary Refill: Capillary refill takes less than 2 seconds.  Neurological:     Mental Status: He is alert. Mental status is at baseline.  Psychiatric:        Mood and Affect: Mood normal.        Behavior: Behavior  normal.     ED Results / Procedures / Treatments   Labs (all labs ordered are listed, but only abnormal results are displayed) Labs Reviewed  COMPREHENSIVE METABOLIC PANEL - Abnormal; Notable for the following components:      Result Value   Sodium 133 (*)    Glucose, Bld 104 (*)    BUN 25 (*)    Creatinine, Ser 1.94 (*)    Calcium 8.7 (*)    GFR, Estimated 38 (*)    All other components  within normal limits  LIPASE, BLOOD  CBC  URINALYSIS, ROUTINE W REFLEX MICROSCOPIC  POC OCCULT BLOOD, ED    EKG None  Radiology No results found.  Procedures Procedures    Medications Ordered in ED Medications - No data to display  ED Course/ Medical Decision Making/ A&P Clinical Course as of 10/01/22 0545  Sun Oct 01, 2022  0540 + hemoccult (exam w light brown stool no BRBPR) [WF]    Clinical Course User Index [WF] Gailen Shelter, PA                             Medical Decision Making Amount and/or Complexity of Data Reviewed Labs: ordered.   This patient presents to the ED for concern of abd pain, this involves a number of treatment options, and is a complaint that carries with it a moderate to high risk of complications and morbidity. A differential diagnosis was considered for the patient's symptoms which is discussed below:   The causes of generalized abdominal pain include but are not limited to AAA, mesenteric ischemia, appendicitis, diverticulitis, DKA, gastritis, gastroenteritis, AMI, nephrolithiasis, pancreatitis, peritonitis, adrenal insufficiency,lead poisoning, iron toxicity, intestinal ischemia, constipation, UTI,SBO/LBO, splenic rupture, biliary disease, IBD, IBS, PUD, or hepatitis.   Co morbidities: Discussed in HPI   Brief History:  Patient is a 62 year old male with past medical history significant for CAD, HLD, ischemic cardiomyopathy, LBBB, HTN, paroxysmal A-fib, pacemaker placed in 2018, AAA, he has DES stents to the proximal LAD from January 2024,  most recent echocardiogram in January showed EF of 40 to 45% He is on Plavix and aspirin  Patient presents emergency room with complaints of 1 week of intermittent epigastric/umbilical abdominal pain he states it will come and go without provocation.  Seems to last for minutes at times and sometimes quite a bit longer.  He came to emergency room today because he was woken up at 3 AM by the pain and states it was constant for over an hour he came to the emergency room and is now experience resolution of his pain.  He has had intermittent nausea and nonbloody nonbilious emesis.  No bowel movement in 4 days.  He states that he has had some BRBPR but nothing else has come out of his anus since 4 days ago.   Notably patient was diagnosed with a AAA    EMR reviewed including pt PMHx, past surgical history and past visits to ER.   See HPI for more details   Lab Tests:  I personally reviewed all laboratory work and imaging. Metabolic panel without any acute abnormality specifically kidney function within normal limits and no significant electrolyte abnormalities. CBC without leukocytosis or significant anemia.   Hemoccult positive however rectal exam with normal color brown stool.  No hemorrhoids or fissures.  Hemoglobin normal  UA unremarkable  Imaging Studies:  Aortic ultrasound pending   10/21/20 CT WO 3.2 cm infrarenal aortic aneurysm   08/29/22 US Aorta IMPRESSION:  1. Mild dilatation abdominal aorta measuring 4 cm.  2. Right hydronephrosis and 4.7 cm simple cysts left kidney. Abdomen pelvic CT would be helpful in further evaluation.   Cardiac Monitoring:  The patient was maintained on a cardiac monitor.  I personally viewed and interpreted the cardiac monitored which showed an underlying rhythm of: Paced rhythm on monitor EKG non-ischemic paced rhythm appears to be paced atrial with aberrant conduction resulting in wide QRS   Medicines ordered:  No  symptoms  currently   Critical Interventions:     Consults/Attending Physician   I discussed this case with my attending physician who cosigned this note including patient's presenting symptoms, physical exam, and planned diagnostics and interventions. Attending physician stated agreement with plan or made changes to plan which were implemented.   Attending physician assessed patient at bedside.    Reevaluation:  After the interventions noted above I re-evaluated patient and found that they have :stayed the same   Social Determinants of Health:  The patient's social determinants of health were a factor in the care of this patient    Problem List / ED Course:  Patient with intermittent abdominal pain for 1 week - US aorta pending. No pain currently. Reassuring workup w labs/exam.  BRBPR - normal exam. + hemoccult however patient has no anemia and is overall well-appearing with normal vital signs no hypotension.  Seems to be chronically hypertensive in the setting of AAA this is certainly an issue that needs to be addressed either today or with cardiology/primary care. AAA -will be evaluated today with ultrasound.   Dispostion:  Pt transferred to AM team.   Final Clinical Impression(s) / ED Diagnoses Final diagnoses:  None    Rx / DC Orders ED Discharge Orders     None         Gailen Shelter, [place] 10/01/22 1610    Marily Memos, MD 10/01/22 [id number]

## 2022-10-01 NOTE — ED Notes (Signed)
ED TO INPATIENT HANDOFF REPORT  ED Nurse Name and Phone #: Nicholos Johns [phone]  S Name/Age/Gender Peter Marsh 62 y.o. male Room/Bed: 032C/032C  Code Status   Code Status: Full Code  Home/SNF/Other Home Patient oriented to: self, place, time, and situation Is this baseline? Yes   Triage Complete: Triage complete  Chief Complaint Acute cholecystitis [K81.0]  Triage Note Patient has multiple complaints , c/o abd. Pain onset 3-4 days ago  states he has an abd. Aneurysm was suppose to have ct of his abd. Last week however didn't have to up front money to do so. States his has  a cyst on his kidney and is on the renal transplant list . C/o n/v/d. States the abd. Pain comes and goes.    Allergies Allergies  Allergen Reactions   Methylpyrrolidone Hives    froze the intestine   Niacin Itching, Nausea And Vomiting and Other (See Comments)    Flushing, itching, tingling    Ace Inhibitors     Other reaction(s): CKD 4   Norvasc [Amlodipine Besylate] Other (See Comments)    Swollen Feet   Other Other (See Comments)   Oxybutynin Chloride [Oxybutynin Chloride Er] Other (See Comments)    froze the intestine   Statins     Reports severe reaction but cannot remember exactly what it was    Vesicare [Solifenacin Succinate] Other (See Comments)    Froze the intestine    Ciprofloxacin Rash and Other (See Comments)    Felt flushed    Oxybutynin Rash and Other (See Comments)    bowel obst   Solifenacin Rash    Level of Care/Admitting Diagnosis ED Disposition     ED Disposition  Admit   Condition  --   Comment  Hospital Area: [facility] [[id number]]  Level of Care: Progressive [102]  Admit to Progressive based on following criteria: Other see comments  Comments: Nursing reportedly refusing patient on telemetry unit due to elevated BP.  May place patient in observation at [facility] or [facility] if equivalent level of care is available:: No  Covid Evaluation:  Asymptomatic - no recent exposure (last 10 days) testing not required  Diagnosis: Acute cholecystitis [575.0.ICD-9-CM]  Admitting Physician: Synetta Fail [[id number]]  Attending Physician: Synetta Fail [[id number]]          B Medical/Surgery History Past Medical History:  Diagnosis Date   Allergy    Anxiety    Asthma    uses inhalers    Bilateral carotid bruits    Cardiac conduction disorder 2018   s/p MDT PPM   Cataract    Chest pain 06/24/2018   Chronic kidney disease    bladder interstial cystitis    Chronic kidney disease (CKD), stage IV (severe) ([name])    followed by Dr. Marisue Humble at [facility]   COPD (chronic obstructive pulmonary disease) ([name])    Coronary artery disease    COVID-19 virus infection 10/21/2020   Depression    Diabetes mellitus without complication ([name])    Discord with neighbors, lodgers and landlord 11/26/2021   Encounter for care of pacemaker 02/13/2019   GERD (gastroesophageal reflux disease)    Hematemesis 09/16/2016   History of stomach ulcers 2001   Hypertension    LBBB (left bundle branch block)    Lower extremity edema    Mild intermittent asthma without complication    Mixed hyperlipidemia    Mobitz type 2 second degree AV block 04/06/2019   Neuropathy  Pacemaker: Medtronic Azure XT DR MRI [id number] PPM -  BUNDLE OF HIS pacing  02/26/2017   Scheduled Remote pacemaker check  11/12/2018:  There were 24 Fast AV episodes:  EGMs show SVTs. Episodes lasted < 2 minutes. Health trends do not demonstrate significant abnormality. Battery longevity is 9.4 - 10.3 years. RA pacing is 47.1 %, RV pacing is 40.4 %.  Clinic check 11/07/17.    Paroxysmal atrial flutter ([name])    PONV (postoperative nausea and vomiting)    Prostatitis    Recurrent upper respiratory infection (URI)    Schizophrenia ([name])    Sinus node dysfunction ([name]) 02/13/2019   Urticaria    Past Surgical History:  Procedure Laterality Date   ADENOIDECTOMY      APPENDECTOMY     COLONOSCOPY     CORONARY STENT INTERVENTION N/A 05/26/2022   Procedure: CORONARY STENT INTERVENTION;  Surgeon: Corky Crafts, MD;  Location: [facility] INVASIVE CV LAB;  Service: Cardiovascular;  Laterality: N/A;   CORONARY ULTRASOUND/IVUS N/A 05/26/2022   Procedure: Intravascular Ultrasound/IVUS;  Surgeon: Corky Crafts, MD;  Location: [facility] INVASIVE CV LAB;  Service: Cardiovascular;  Laterality: N/A;   ELECTROPHYSIOLOGY STUDY N/A 02/26/2017   Procedure: ELECTROPHYSIOLOGY STUDY;  Surgeon: Marinus Maw, MD;  Location: [facility] INVASIVE CV LAB;  Service: Cardiovascular;  Laterality: N/A;   ESOPHAGOGASTRODUODENOSCOPY (EGD) WITH PROPOFOL N/A 09/18/2016   Procedure: ESOPHAGOGASTRODUODENOSCOPY (EGD) WITH PROPOFOL;  Surgeon: Kerin Salen, MD;  Location: [facility] ENDOSCOPY;  Service: Gastroenterology;  Laterality: N/A;   LEFT HEART CATH AND CORONARY ANGIOGRAPHY N/A 05/26/2022   Procedure: LEFT HEART CATH AND CORONARY ANGIOGRAPHY;  Surgeon: Corky Crafts, MD;  Location: [facility] INVASIVE CV LAB;  Service: Cardiovascular;  Laterality: N/A;   LUMBAR LAMINECTOMY/DECOMPRESSION MICRODISCECTOMY  04/19/2011   Procedure: LUMBAR LAMINECTOMY/DECOMPRESSION MICRODISCECTOMY;  Surgeon: Jacki Cones;  Location: [facility] ORS;  Service: Orthopedics;  Laterality: Left;  Hemi LAminectomy/Microdiscectomy Lumbar four  - Lumbar five  on the Left (X-Ray)   PACEMAKER IMPLANT N/A 02/26/2017   Procedure: PACEMAKER IMPLANT;  Surgeon: Marinus Maw, MD;  Location: [facility] INVASIVE CV LAB;  Service: Cardiovascular;  Laterality: N/A;   TONSILLECTOMY     UPPER GASTROINTESTINAL ENDOSCOPY       A IV Location/Drains/Wounds Patient Lines/Drains/Airways Status     Active Line/Drains/Airways     Name Placement date Placement time Site Days   Peripheral IV 10/01/22 Right Antecubital 10/01/22  0506  Antecubital  less than 1            Intake/Output Last 24 hours No intake or output data in the 24 hours ending 10/01/22  1822  Labs/Imaging Results for orders placed or performed during the hospital encounter of 10/01/22 (from the past 48 hour(s))  Lipase, blood     Status: None   Collection Time: 10/01/22  4:20 AM  Result Value Ref Range   Lipase 32 11 - 51 U/L    Comment: Performed at [facility], [address] [place], [place] [address]  Comprehensive metabolic panel     Status: Abnormal   Collection Time: 10/01/22  4:20 AM  Result Value Ref Range   Sodium 133 (L) 135 - 145 mmol/L   Potassium 4.6 3.5 - 5.1 mmol/L   Chloride 100 98 - 111 mmol/L   CO2 25 22 - 32 mmol/L   Glucose, Bld 104 (H) 70 - 99 mg/dL    Comment: Glucose reference range applies only to samples taken after fasting for at least 8 hours.  BUN 25 (H) 8 - 23 mg/dL   Creatinine, Ser 6.04 (H) 0.61 - 1.24 mg/dL   Calcium 8.7 (L) 8.9 - 10.3 mg/dL   Total Protein 6.9 6.5 - 8.1 g/dL   Albumin 3.7 3.5 - 5.0 g/dL   AST 23 15 - 41 U/L   ALT 17 0 - 44 U/L   Alkaline Phosphatase 66 38 - 126 U/L   Total Bilirubin 0.8 0.3 - 1.2 mg/dL   GFR, Estimated 38 (L) >60 mL/min    Comment: (NOTE) Calculated using the CKD-EPI Creatinine Equation (2021)    Anion gap 8 5 - 15    Comment: Performed at [facility], [address] [place], [place] [address]  CBC     Status: None   Collection Time: 10/01/22  4:20 AM  Result Value Ref Range   WBC 8.2 4.0 - 10.5 K/uL   RBC 4.78 4.22 - 5.81 MIL/uL   Hemoglobin 13.4 13.0 - 17.0 g/dL   HCT 11.9 14.7 - 82.9 %   MCV 88.9 80.0 - 100.0 fL   MCH 28.0 26.0 - 34.0 pg   MCHC 31.5 30.0 - 36.0 g/dL   RDW 56.2 13.0 - 86.5 %   Platelets 210 150 - 400 K/uL   nRBC 0.0 0.0 - 0.2 %    Comment: Performed at [facility], [address] [place], [place] [address]  Urinalysis, Routine w reflex microscopic -Urine, Clean Catch     Status: None   Collection Time: 10/01/22  4:20 AM  Result Value Ref Range   Color, Urine YELLOW YELLOW   APPearance CLEAR CLEAR   Specific Gravity, Urine 1.009 1.005 -  1.030   pH 6.0 5.0 - 8.0   Glucose, UA NEGATIVE NEGATIVE mg/dL   Hgb urine dipstick NEGATIVE NEGATIVE   Bilirubin Urine NEGATIVE NEGATIVE   Ketones, ur NEGATIVE NEGATIVE mg/dL   Protein, ur NEGATIVE NEGATIVE mg/dL   Nitrite NEGATIVE NEGATIVE   Leukocytes,Ua NEGATIVE NEGATIVE    Comment: Performed at [facility], [address], [place], [place] [address]   US Abdomen Limited RUQ (LIVER/GB)  Result Date: 10/01/2022 CLINICAL DATA:  Gallstones. EXAM: ULTRASOUND ABDOMEN LIMITED RIGHT UPPER QUADRANT COMPARISON:  CT from earlier today FINDINGS: Gallbladder: 5 mm stone identified within the gallbladder. The gallbladder appears partially decompressed with wall thickening measuring up to 6.6 mm. Positive sonographic Murphy's sign was reported by the sonographer. Common bile duct: Diameter: 3.1 Liver: No focal lesion identified. Within normal limits in parenchymal echogenicity. Portal vein is patent on color Doppler imaging with normal direction of blood flow towards the liver. Other: Diminished exam detail due to overlying bowel gas. IMPRESSION: Gallstone with gallbladder wall thickening and positive sonographic Murphy's sign. Cannot exclude acute cholecystitis. Electronically Signed   By: Signa Kell M.D.   On: 10/01/2022 10:18   CT Angio Chest/Abd/Pel for Dissection W and/or W/WO  Result Date: 10/01/2022 CLINICAL DATA:  Acute aortic syndrome. EXAM: CT ANGIOGRAPHY CHEST, ABDOMEN AND PELVIS TECHNIQUE: Non-contrast CT of the chest was initially obtained. Multidetector CT imaging through the chest, abdomen and pelvis was performed using the standard protocol during bolus administration of intravenous contrast. Multiplanar reconstructed images and MIPs were obtained and reviewed to evaluate the vascular anatomy. RADIATION DOSE REDUCTION: This exam was performed according to the departmental dose-optimization program which includes automated exposure control, adjustment of the mA and/or kV according to  patient size and/or use of iterative reconstruction technique. CONTRAST:  OMNIPAQUE IOHEXOL 350  MG/ML SOLN COMPARISON:  Abdomen pelvis CT 10/21/2020 FINDINGS: CTA CHEST FINDINGS Cardiovascular: The heart size is normal. No substantial pericardial effusion. Coronary artery calcification is evident. Coronary stent device evident. Mild atherosclerotic calcification is noted in the wall of the thoracic aorta. Left-sided permanent pacemaker evident. Precontrast imaging shows no hyperdense crescent in the wall of the thoracic aorta to suggest the presence of an acute intramural hematoma. No thoracic aortic aneurysm. Imaging after IV contrast administration shows no dissection of the thoracic aorta. Sagittal imaging reveals a penetrating ulcer or ulcerated plaque along the under surface of the transverse aorta measuring on the order of 11 x 8 mm. This is also seen on axial image 43/6. Mediastinum/Nodes: No mediastinal lymphadenopathy. No evidence for gross hilar lymphadenopathy although assessment is limited by the lack of intravenous contrast on the current study. The esophagus has normal imaging features. There is no axillary lymphadenopathy. Lungs/Pleura: Centrilobular and paraseptal emphysema evident. No suspicious pulmonary nodule or mass. No focal airspace consolidation. No pleural effusion. Musculoskeletal: No worrisome lytic or sclerotic osseous abnormality. Review of the MIP images confirms the above findings. CTA ABDOMEN AND PELVIS FINDINGS VASCULAR Aorta: Infrarenal abdominal aorta measures 3.9 x 3.7 cm in maximum orthogonal diameter. Atherosclerotic changes evident with prominent mural thrombus in the aneurysmal segment. No dissection, vasculitis or significant stenosis. Celiac: Patent without evidence of aneurysm, dissection, vasculitis or significant stenosis. SMA: Patent without evidence of aneurysm, dissection, vasculitis or significant stenosis. Renals: Both renal arteries are patent without evidence  of aneurysm, dissection, vasculitis, fibromuscular dysplasia or significant stenosis. IMA: Chronic occlusion Inflow: Patent without evidence of aneurysm, dissection, vasculitis or significant stenosis. Veins: No obvious venous abnormality within the limitations of this arterial phase study. Review of the MIP images confirms the above findings. NON-VASCULAR Hepatobiliary: No suspicious focal abnormality within the liver parenchyma. Gallbladder is decompressed which accentuates wall thickness gallbladder wall is ill-defined raising the question of pericholecystic edema. 6 mm gallstone evident. No intrahepatic or extrahepatic biliary dilation. Pancreas: Diffuse parenchymal calcification consistent with chronic pancreatitis. No main duct dilatation. Spleen: No splenomegaly. No focal mass lesion. Adrenals/Urinary Tract: No adrenal nodule or mass. Large central sinus cysts noted left kidney. There is dilatation of an upper pole calyx, similar to CT scan from 09/25/2014, potentially due to mass-effect from the large central sinus cyst. Overlying cortical thinning right kidney with levin mm stone upper interpolar region and 10 mm stone in the lower interpolar region. No associated right hydroureter. Component of right UPJ obstruction cannot be excluded. 5.5 cm exophytic low-density lesion upper pole left kidney has increased from 4.7 cm on the 2022 exam. 15 mm subcapsular lesion upper pole left kidney is similar to prior. Both of these lesions have attenuation slightly higher than would be expected for simple cyst. 8 mm nonobstructing stone identified lower pole left kidney. Left ureter unremarkable. The urinary bladder appears normal for the degree of distention. Stomach/Bowel: Stomach is unremarkable. No gastric wall thickening. No evidence of outlet obstruction. Duodenum is normally positioned as is the ligament of Treitz. No small bowel wall thickening. No small bowel dilatation. The terminal ileum is normal. The  appendix is not well visualized, but there is no edema or inflammation in the region of the cecum. No gross colonic mass. No colonic wall thickening. Lymphatic: There is no gastrohepatic or hepatoduodenal ligament lymphadenopathy. No retroperitoneal or mesenteric lymphadenopathy. No pelvic sidewall lymphadenopathy. Reproductive: The prostate gland and seminal vesicles are unremarkable. Other: No intraperitoneal free fluid. Musculoskeletal: No worrisome lytic or sclerotic osseous  abnormality. Review of the MIP images confirms the above findings. IMPRESSION: 1. No evidence for acute intramural hematoma or dissection in the thoracoabdominal aorta. 2. 11 x 8 mm penetrating ulcer or ulcerated plaque along the under surface of the transverse aorta. 3. 3.9 x 3.7 cm infrarenal abdominal aortic aneurysm. 4. Gallbladder is decompressed which accentuates wall thickness. Gallbladder wall is ill-defined raising the question of pericholecystic edema. There is a 6 mm gallstone evident. Right upper quadrant ultrasound may prove helpful to further evaluate. 5. Large central sinus cyst left kidney with dilatation of an upper pole calyx, similar to CT scan from 09/25/2014, potentially due to mass-effect from the large central sinus cyst. Component of right UPJ obstruction cannot be excluded. 6. Bilateral nonobstructing nephrolithiasis. 7. 5.5 cm exophytic low-density lesion upper pole left kidney has increased from 4.7 cm on the 2022 exam. Another smaller upper pole left renal lesion is also mildly progressed. Both of these lesions have attenuation slightly higher than would be expected for simple cysts. While likely cysts complicated by proteinaceous debris or hemorrhage. Given the presence of permanent pacemaker, follow-up CT abdomen with and without contrast may be warranted to exclude hypoenhancing neoplasm. 8. Chronic pancreatitis. 9.  Emphysema (ICD10-J43.9). Electronically Signed   By: Kennith Center M.D.   On: 10/01/2022 08:51    US Aorta  Result Date: 10/01/2022 CLINICAL DATA:  Abdominal pain. History of abdominal aortic aneurysm. EXAM: ULTRASOUND OF ABDOMINAL AORTA TECHNIQUE: Ultrasound examination of the abdominal aorta and proximal common iliac arteries was performed to evaluate for aneurysm. Additional color and Doppler images of the distal aorta were obtained to document patency. COMPARISON:  CT abdomen/pelvis 10/21/2020 FINDINGS: Abdominal aortic measurements as follows: Proximal:  2.7 x 2.9 cm Mid:  2.1 x 2.0 cm Distal:  3.5 x 3.9 cm Patent: Yes, peak systolic velocity is 63.6 cm/s Right common iliac artery: 1.0 x 1.1 cm Left common iliac artery: 0.7 x 1.1 cm IMPRESSION: 3.9 cm abdominal aortic aneurysm. Infrarenal abdominal aorta was 3.4 cm maximum diameter when remeasured on the study from 10/21/2020 compatible with mild interval progression. Ultrasound can be insensitive for acute aortic aneurysm leak and dissection. Recommend follow-up ultrasound every 2 years. This recommendation follows ACR consensus guidelines: White Paper of the ACR Incidental Findings Committee II on Vascular Findings. J Am Coll Radiol 2013; 10:789-794. Electronically Signed   By: Kennith Center M.D.   On: 10/01/2022 07:30    Pending Labs Unresulted Labs (From admission, onward)     Start     Ordered   10/02/22 0500  Comprehensive metabolic panel  Tomorrow morning,   R        10/01/22 1229   10/02/22 0500  CBC  Tomorrow morning,   R        10/01/22 1229            Vitals/Pain Today's Vitals   10/01/22 1730 10/01/22 1740 10/01/22 1745 10/01/22 1800  BP: (!) 142/89     Pulse: 61  64 60  Resp: 17  16 15   Temp:  98.1 F (36.7 C)    TempSrc:  Oral    SpO2: 98%  96% 97%  Weight:      Height:      PainSc:        Isolation Precautions No active isolations  Medications Medications  aspirin chewable tablet 81 mg (has no administration in time range)  cloNIDine (CATAPRES) tablet 0.2 mg (0.2 mg Oral Not Given 10/01/22 1657)   ezetimibe (ZETIA) tablet  10 mg (has no administration in time range)  hydrALAZINE (APRESOLINE) tablet 100 mg (100 mg Oral Not Given 10/01/22 1658)  isosorbide dinitrate (ISORDIL) tablet 30 mg (30 mg Oral Given 10/01/22 1659)  metoprolol succinate (TOPROL-XL) 24 hr tablet 100 mg (has no administration in time range)  buPROPion (WELLBUTRIN SR) 12 hr tablet 150 mg (has no administration in time range)  paliperidone (INVEGA) 24 hr tablet 6 mg (has no administration in time range)  pantoprazole (PROTONIX) EC tablet 40 mg (40 mg Oral Not Given 10/01/22 1340)  sucralfate (CARAFATE) 1 GM/10ML suspension 1 g (has no administration in time range)  clopidogrel (PLAVIX) tablet 75 mg (has no administration in time range)  montelukast (SINGULAIR) tablet 10 mg (has no administration in time range)  Budeson-Glycopyrrol-Formoterol 160-9-4.8 MCG/ACT AERO 2 puff (has no administration in time range)  albuterol (PROVENTIL) (2.5 MG/3ML) 0.083% nebulizer solution 3 mL (has no administration in time range)  sodium chloride flush (NS) 0.9 % injection 3 mL (3 mLs Intravenous Not Given 10/01/22 1258)  acetaminophen (TYLENOL) tablet 650 mg (has no administration in time range)    Or  acetaminophen (TYLENOL) suppository 650 mg (has no administration in time range)  polyethylene glycol (MIRALAX / GLYCOLAX) packet 17 g (has no administration in time range)  0.9 %  sodium chloride infusion ( Intravenous New Bag/Given 10/01/22 1549)  labetalol (NORMODYNE) injection 5 mg (5 mg Intravenous Given 10/01/22 1700)  iohexol (OMNIPAQUE) 350 MG/ML injection 100 mL (100 mLs Intravenous Contrast Given 10/01/22 0808)  sodium chloride 0.9 % bolus 500 mL (500 mLs Intravenous New Bag/Given 10/01/22 1109)  ondansetron (ZOFRAN) injection 4 mg (4 mg Intravenous Given 10/01/22 1110)    Mobility walks     Focused Assessments Cardiac Assessment Handoff:    Lab Results  Component Value Date   CKTOTAL 73 01/10/2010   CKMB 1.0 01/10/2010    TROPONINI <0.03 06/24/2018   Lab Results  Component Value Date   DDIMER 1.84 (H) 10/25/2020   Does the Patient currently have chest pain? No    R Recommendations: See Admitting Provider Note  Report given to:   Additional Notes: ambulatory, continent

## 2022-10-01 NOTE — ED Notes (Signed)
POC occult blood test positive...will not upload in epic.   Documented on log sheet in mini lab.

## 2022-10-01 NOTE — ED Notes (Signed)
ED TO INPATIENT HANDOFF REPORT  ED Nurse Name and Phone #: Nicholos Johns [phone]  S Name/Age/Gender Peter Marsh 62 y.o. male Room/Bed: 032C/032C  Code Status   Code Status: Full Code  Home/SNF/Other Home Patient oriented to: self, place, time, and situation Is this baseline? Yes   Triage Complete: Triage complete  Chief Complaint Acute cholecystitis [K81.0]  Triage Note Patient has multiple complaints , c/o abd. Pain onset 3-4 days ago  states he has an abd. Aneurysm was suppose to have ct of his abd. Last week however didn't have to up front money to do so. States his has  a cyst on his kidney and is on the renal transplant list . C/o n/v/d. States the abd. Pain comes and goes.    Allergies Allergies  Allergen Reactions   Methylpyrrolidone Hives    froze the intestine   Niacin Itching, Nausea And Vomiting and Other (See Comments)    Flushing, itching, tingling    Ace Inhibitors     Other reaction(s): CKD 4   Norvasc [Amlodipine Besylate] Other (See Comments)    Swollen Feet   Other Other (See Comments)   Oxybutynin Chloride [Oxybutynin Chloride Er] Other (See Comments)    froze the intestine   Statins     Reports severe reaction but cannot remember exactly what it was    Vesicare [Solifenacin Succinate] Other (See Comments)    Froze the intestine    Ciprofloxacin Rash and Other (See Comments)    Felt flushed    Oxybutynin Rash and Other (See Comments)    bowel obst   Solifenacin Rash    Level of Care/Admitting Diagnosis ED Disposition     ED Disposition  Admit   Condition  --   Comment  Hospital Area: [facility] [[id number]]  Level of Care: Progressive [102]  Admit to Progressive based on following criteria: Other see comments  Comments: Nursing reportedly refusing patient on telemetry unit due to elevated BP.  May place patient in observation at [facility] or [facility] if equivalent level of care is available:: No  Covid Evaluation:  Asymptomatic - no recent exposure (last 10 days) testing not required  Diagnosis: Acute cholecystitis [575.0.ICD-9-CM]  Admitting Physician: Synetta Fail [[id number]]  Attending Physician: Synetta Fail [[id number]]          B Medical/Surgery History Past Medical History:  Diagnosis Date   Allergy    Anxiety    Asthma    uses inhalers    Bilateral carotid bruits    Cardiac conduction disorder 2018   s/p MDT PPM   Cataract    Chest pain 06/24/2018   Chronic kidney disease    bladder interstial cystitis    Chronic kidney disease (CKD), stage IV (severe) ([name])    followed by Dr. Marisue Humble at [facility]   COPD (chronic obstructive pulmonary disease) ([name])    Coronary artery disease    COVID-19 virus infection 10/21/2020   Depression    Diabetes mellitus without complication ([name])    Discord with neighbors, lodgers and landlord 11/26/2021   Encounter for care of pacemaker 02/13/2019   GERD (gastroesophageal reflux disease)    Hematemesis 09/16/2016   History of stomach ulcers 2001   Hypertension    LBBB (left bundle branch block)    Lower extremity edema    Mild intermittent asthma without complication    Mixed hyperlipidemia    Mobitz type 2 second degree AV block 04/06/2019   Neuropathy  Pacemaker: Medtronic Azure XT DR MRI [id number] PPM -  BUNDLE OF HIS pacing  02/26/2017   Scheduled Remote pacemaker check  11/12/2018:  There were 24 Fast AV episodes:  EGMs show SVTs. Episodes lasted < 2 minutes. Health trends do not demonstrate significant abnormality. Battery longevity is 9.4 - 10.3 years. RA pacing is 47.1 %, RV pacing is 40.4 %.  Clinic check 11/07/17.    Paroxysmal atrial flutter ([name])    PONV (postoperative nausea and vomiting)    Prostatitis    Recurrent upper respiratory infection (URI)    Schizophrenia ([name])    Sinus node dysfunction ([name]) 02/13/2019   Urticaria    Past Surgical History:  Procedure Laterality Date   ADENOIDECTOMY      APPENDECTOMY     COLONOSCOPY     CORONARY STENT INTERVENTION N/A 05/26/2022   Procedure: CORONARY STENT INTERVENTION;  Surgeon: Corky Crafts, MD;  Location: [facility] INVASIVE CV LAB;  Service: Cardiovascular;  Laterality: N/A;   CORONARY ULTRASOUND/IVUS N/A 05/26/2022   Procedure: Intravascular Ultrasound/IVUS;  Surgeon: Corky Crafts, MD;  Location: [facility] INVASIVE CV LAB;  Service: Cardiovascular;  Laterality: N/A;   ELECTROPHYSIOLOGY STUDY N/A 02/26/2017   Procedure: ELECTROPHYSIOLOGY STUDY;  Surgeon: Marinus Maw, MD;  Location: [facility] INVASIVE CV LAB;  Service: Cardiovascular;  Laterality: N/A;   ESOPHAGOGASTRODUODENOSCOPY (EGD) WITH PROPOFOL N/A 09/18/2016   Procedure: ESOPHAGOGASTRODUODENOSCOPY (EGD) WITH PROPOFOL;  Surgeon: Kerin Salen, MD;  Location: [facility] ENDOSCOPY;  Service: Gastroenterology;  Laterality: N/A;   LEFT HEART CATH AND CORONARY ANGIOGRAPHY N/A 05/26/2022   Procedure: LEFT HEART CATH AND CORONARY ANGIOGRAPHY;  Surgeon: Corky Crafts, MD;  Location: [facility] INVASIVE CV LAB;  Service: Cardiovascular;  Laterality: N/A;   LUMBAR LAMINECTOMY/DECOMPRESSION MICRODISCECTOMY  04/19/2011   Procedure: LUMBAR LAMINECTOMY/DECOMPRESSION MICRODISCECTOMY;  Surgeon: Jacki Cones;  Location: [facility] ORS;  Service: Orthopedics;  Laterality: Left;  Hemi LAminectomy/Microdiscectomy Lumbar four  - Lumbar five  on the Left (X-Ray)   PACEMAKER IMPLANT N/A 02/26/2017   Procedure: PACEMAKER IMPLANT;  Surgeon: Marinus Maw, MD;  Location: [facility] INVASIVE CV LAB;  Service: Cardiovascular;  Laterality: N/A;   TONSILLECTOMY     UPPER GASTROINTESTINAL ENDOSCOPY       A IV Location/Drains/Wounds Patient Lines/Drains/Airways Status     Active Line/Drains/Airways     Name Placement date Placement time Site Days   Peripheral IV 10/01/22 Right Antecubital 10/01/22  0506  Antecubital  less than 1            Intake/Output Last 24 hours No intake or output data in the 24 hours ending 10/01/22  1544  Labs/Imaging Results for orders placed or performed during the hospital encounter of 10/01/22 (from the past 48 hour(s))  Lipase, blood     Status: None   Collection Time: 10/01/22  4:20 AM  Result Value Ref Range   Lipase 32 11 - 51 U/L    Comment: Performed at [facility], [address], [place], [place] [address]  Comprehensive metabolic panel     Status: Abnormal   Collection Time: 10/01/22  4:20 AM  Result Value Ref Range   Sodium 133 (L) 135 - 145 mmol/L   Potassium 4.6 3.5 - 5.1 mmol/L   Chloride 100 98 - 111 mmol/L   CO2 25 22 - 32 mmol/L   Glucose, Bld 104 (H) 70 - 99 mg/dL    Comment: Glucose reference range applies only to samples taken after fasting for at least 8 hours.  BUN 25 (H) 8 - 23 mg/dL   Creatinine, Ser 1.61 (H) 0.61 - 1.24 mg/dL   Calcium 8.7 (L) 8.9 - 10.3 mg/dL   Total Protein 6.9 6.5 - 8.1 g/dL   Albumin 3.7 3.5 - 5.0 g/dL   AST 23 15 - 41 U/L   ALT 17 0 - 44 U/L   Alkaline Phosphatase 66 38 - 126 U/L   Total Bilirubin 0.8 0.3 - 1.2 mg/dL   GFR, Estimated 38 (L) >60 mL/min    Comment: (NOTE) Calculated using the CKD-EPI Creatinine Equation (2021)    Anion gap 8 5 - 15    Comment: Performed at [facility], [address], [place], [place] [address]  CBC     Status: None   Collection Time: 10/01/22  4:20 AM  Result Value Ref Range   WBC 8.2 4.0 - 10.5 K/uL   RBC 4.78 4.22 - 5.81 MIL/uL   Hemoglobin 13.4 13.0 - 17.0 g/dL   HCT 54.0 98.1 - 19.1 %   MCV 88.9 80.0 - 100.0 fL   MCH 28.0 26.0 - 34.0 pg   MCHC 31.5 30.0 - 36.0 g/dL   RDW 47.8 29.5 - 62.1 %   Platelets 210 150 - 400 K/uL   nRBC 0.0 0.0 - 0.2 %    Comment: Performed at [facility], [address] [place], [place] [address]  Urinalysis, Routine w reflex microscopic -Urine, Clean Catch     Status: None   Collection Time: 10/01/22  4:20 AM  Result Value Ref Range   Color, Urine YELLOW YELLOW   APPearance CLEAR CLEAR   Specific Gravity, Urine 1.009 1.005 -  1.030   pH 6.0 5.0 - 8.0   Glucose, UA NEGATIVE NEGATIVE mg/dL   Hgb urine dipstick NEGATIVE NEGATIVE   Bilirubin Urine NEGATIVE NEGATIVE   Ketones, ur NEGATIVE NEGATIVE mg/dL   Protein, ur NEGATIVE NEGATIVE mg/dL   Nitrite NEGATIVE NEGATIVE   Leukocytes,Ua NEGATIVE NEGATIVE    Comment: Performed at [facility], [address], [place], [place] [address]   US Abdomen Limited RUQ (LIVER/GB)  Result Date: 10/01/2022 CLINICAL DATA:  Gallstones. EXAM: ULTRASOUND ABDOMEN LIMITED RIGHT UPPER QUADRANT COMPARISON:  CT from earlier today FINDINGS: Gallbladder: 5 mm stone identified within the gallbladder. The gallbladder appears partially decompressed with wall thickening measuring up to 6.6 mm. Positive sonographic Murphy's sign was reported by the sonographer. Common bile duct: Diameter: 3.1 Liver: No focal lesion identified. Within normal limits in parenchymal echogenicity. Portal vein is patent on color Doppler imaging with normal direction of blood flow towards the liver. Other: Diminished exam detail due to overlying bowel gas. IMPRESSION: Gallstone with gallbladder wall thickening and positive sonographic Murphy's sign. Cannot exclude acute cholecystitis. Electronically Signed   By: Signa Kell M.D.   On: 10/01/2022 10:18   CT Angio Chest/Abd/Pel for Dissection W and/or W/WO  Result Date: 10/01/2022 CLINICAL DATA:  Acute aortic syndrome. EXAM: CT ANGIOGRAPHY CHEST, ABDOMEN AND PELVIS TECHNIQUE: Non-contrast CT of the chest was initially obtained. Multidetector CT imaging through the chest, abdomen and pelvis was performed using the standard protocol during bolus administration of intravenous contrast. Multiplanar reconstructed images and MIPs were obtained and reviewed to evaluate the vascular anatomy. RADIATION DOSE REDUCTION: This exam was performed according to the departmental dose-optimization program which includes automated exposure control, adjustment of the mA and/or kV according to  patient size and/or use of iterative reconstruction technique. CONTRAST:  OMNIPAQUE IOHEXOL 350  MG/ML SOLN COMPARISON:  Abdomen pelvis CT 10/21/2020 FINDINGS: CTA CHEST FINDINGS Cardiovascular: The heart size is normal. No substantial pericardial effusion. Coronary artery calcification is evident. Coronary stent device evident. Mild atherosclerotic calcification is noted in the wall of the thoracic aorta. Left-sided permanent pacemaker evident. Precontrast imaging shows no hyperdense crescent in the wall of the thoracic aorta to suggest the presence of an acute intramural hematoma. No thoracic aortic aneurysm. Imaging after IV contrast administration shows no dissection of the thoracic aorta. Sagittal imaging reveals a penetrating ulcer or ulcerated plaque along the under surface of the transverse aorta measuring on the order of 11 x 8 mm. This is also seen on axial image 43/6. Mediastinum/Nodes: No mediastinal lymphadenopathy. No evidence for gross hilar lymphadenopathy although assessment is limited by the lack of intravenous contrast on the current study. The esophagus has normal imaging features. There is no axillary lymphadenopathy. Lungs/Pleura: Centrilobular and paraseptal emphysema evident. No suspicious pulmonary nodule or mass. No focal airspace consolidation. No pleural effusion. Musculoskeletal: No worrisome lytic or sclerotic osseous abnormality. Review of the MIP images confirms the above findings. CTA ABDOMEN AND PELVIS FINDINGS VASCULAR Aorta: Infrarenal abdominal aorta measures 3.9 x 3.7 cm in maximum orthogonal diameter. Atherosclerotic changes evident with prominent mural thrombus in the aneurysmal segment. No dissection, vasculitis or significant stenosis. Celiac: Patent without evidence of aneurysm, dissection, vasculitis or significant stenosis. SMA: Patent without evidence of aneurysm, dissection, vasculitis or significant stenosis. Renals: Both renal arteries are patent without evidence  of aneurysm, dissection, vasculitis, fibromuscular dysplasia or significant stenosis. IMA: Chronic occlusion Inflow: Patent without evidence of aneurysm, dissection, vasculitis or significant stenosis. Veins: No obvious venous abnormality within the limitations of this arterial phase study. Review of the MIP images confirms the above findings. NON-VASCULAR Hepatobiliary: No suspicious focal abnormality within the liver parenchyma. Gallbladder is decompressed which accentuates wall thickness gallbladder wall is ill-defined raising the question of pericholecystic edema. 6 mm gallstone evident. No intrahepatic or extrahepatic biliary dilation. Pancreas: Diffuse parenchymal calcification consistent with chronic pancreatitis. No main duct dilatation. Spleen: No splenomegaly. No focal mass lesion. Adrenals/Urinary Tract: No adrenal nodule or mass. Large central sinus cysts noted left kidney. There is dilatation of an upper pole calyx, similar to CT scan from 09/25/2014, potentially due to mass-effect from the large central sinus cyst. Overlying cortical thinning right kidney with levin mm stone upper interpolar region and 10 mm stone in the lower interpolar region. No associated right hydroureter. Component of right UPJ obstruction cannot be excluded. 5.5 cm exophytic low-density lesion upper pole left kidney has increased from 4.7 cm on the 2022 exam. 15 mm subcapsular lesion upper pole left kidney is similar to prior. Both of these lesions have attenuation slightly higher than would be expected for simple cyst. 8 mm nonobstructing stone identified lower pole left kidney. Left ureter unremarkable. The urinary bladder appears normal for the degree of distention. Stomach/Bowel: Stomach is unremarkable. No gastric wall thickening. No evidence of outlet obstruction. Duodenum is normally positioned as is the ligament of Treitz. No small bowel wall thickening. No small bowel dilatation. The terminal ileum is normal. The  appendix is not well visualized, but there is no edema or inflammation in the region of the cecum. No gross colonic mass. No colonic wall thickening. Lymphatic: There is no gastrohepatic or hepatoduodenal ligament lymphadenopathy. No retroperitoneal or mesenteric lymphadenopathy. No pelvic sidewall lymphadenopathy. Reproductive: The prostate gland and seminal vesicles are unremarkable. Other: No intraperitoneal free fluid. Musculoskeletal: No worrisome lytic or sclerotic osseous  abnormality. Review of the MIP images confirms the above findings. IMPRESSION: 1. No evidence for acute intramural hematoma or dissection in the thoracoabdominal aorta. 2. 11 x 8 mm penetrating ulcer or ulcerated plaque along the under surface of the transverse aorta. 3. 3.9 x 3.7 cm infrarenal abdominal aortic aneurysm. 4. Gallbladder is decompressed which accentuates wall thickness. Gallbladder wall is ill-defined raising the question of pericholecystic edema. There is a 6 mm gallstone evident. Right upper quadrant ultrasound may prove helpful to further evaluate. 5. Large central sinus cyst left kidney with dilatation of an upper pole calyx, similar to CT scan from 09/25/2014, potentially due to mass-effect from the large central sinus cyst. Component of right UPJ obstruction cannot be excluded. 6. Bilateral nonobstructing nephrolithiasis. 7. 5.5 cm exophytic low-density lesion upper pole left kidney has increased from 4.7 cm on the 2022 exam. Another smaller upper pole left renal lesion is also mildly progressed. Both of these lesions have attenuation slightly higher than would be expected for simple cysts. While likely cysts complicated by proteinaceous debris or hemorrhage. Given the presence of permanent pacemaker, follow-up CT abdomen with and without contrast may be warranted to exclude hypoenhancing neoplasm. 8. Chronic pancreatitis. 9.  Emphysema (ICD10-J43.9). Electronically Signed   By: Kennith Center M.D.   On: 10/01/2022 08:51    US Aorta  Result Date: 10/01/2022 CLINICAL DATA:  Abdominal pain. History of abdominal aortic aneurysm. EXAM: ULTRASOUND OF ABDOMINAL AORTA TECHNIQUE: Ultrasound examination of the abdominal aorta and proximal common iliac arteries was performed to evaluate for aneurysm. Additional color and Doppler images of the distal aorta were obtained to document patency. COMPARISON:  CT abdomen/pelvis 10/21/2020 FINDINGS: Abdominal aortic measurements as follows: Proximal:  2.7 x 2.9 cm Mid:  2.1 x 2.0 cm Distal:  3.5 x 3.9 cm Patent: Yes, peak systolic velocity is 63.6 cm/s Right common iliac artery: 1.0 x 1.1 cm Left common iliac artery: 0.7 x 1.1 cm IMPRESSION: 3.9 cm abdominal aortic aneurysm. Infrarenal abdominal aorta was 3.4 cm maximum diameter when remeasured on the study from 10/21/2020 compatible with mild interval progression. Ultrasound can be insensitive for acute aortic aneurysm leak and dissection. Recommend follow-up ultrasound every 2 years. This recommendation follows ACR consensus guidelines: White Paper of the ACR Incidental Findings Committee II on Vascular Findings. J Am Coll Radiol 2013; 10:789-794. Electronically Signed   By: Kennith Center M.D.   On: 10/01/2022 07:30    Pending Labs Unresulted Labs (From admission, onward)     Start     Ordered   10/02/22 0500  Comprehensive metabolic panel  Tomorrow morning,   R        10/01/22 1229   10/02/22 0500  CBC  Tomorrow morning,   R        10/01/22 1229            Vitals/Pain Today's Vitals   10/01/22 1334 10/01/22 1345 10/01/22 1400 10/01/22 1530  BP: (!) 173/120 (!) 181/99 (!) 191/112 (!) 179/104  Pulse: 60 (!) 59 (!) 59 62  Resp: 17 15 19 15   Temp: 97.8 F (36.6 C)     TempSrc: Oral     SpO2: 96% 97% 99% 96%  Weight:      Height:      PainSc:        Isolation Precautions No active isolations  Medications Medications  aspirin chewable tablet 81 mg (has no administration in time range)  cloNIDine (CATAPRES) tablet  0.2 mg (0.2 mg Oral Given 10/01/22 1307)  ezetimibe (ZETIA) tablet 10 mg (has no administration in time range)  hydrALAZINE (APRESOLINE) tablet 100 mg (100 mg Oral Given 10/01/22 1308)  isosorbide dinitrate (ISORDIL) tablet 30 mg (has no administration in time range)  metoprolol succinate (TOPROL-XL) 24 hr tablet 100 mg (has no administration in time range)  buPROPion (WELLBUTRIN SR) 12 hr tablet 150 mg (has no administration in time range)  paliperidone (INVEGA) 24 hr tablet 6 mg (has no administration in time range)  pantoprazole (PROTONIX) EC tablet 40 mg (40 mg Oral Not Given 10/01/22 1340)  sucralfate (CARAFATE) 1 GM/10ML suspension 1 g (has no administration in time range)  clopidogrel (PLAVIX) tablet 75 mg (has no administration in time range)  montelukast (SINGULAIR) tablet 10 mg (has no administration in time range)  Budeson-Glycopyrrol-Formoterol 160-9-4.8 MCG/ACT AERO 2 puff (has no administration in time range)  albuterol (PROVENTIL) (2.5 MG/3ML) 0.083% nebulizer solution 3 mL (has no administration in time range)  sodium chloride flush (NS) 0.9 % injection 3 mL (3 mLs Intravenous Not Given 10/01/22 1258)  acetaminophen (TYLENOL) tablet 650 mg (has no administration in time range)    Or  acetaminophen (TYLENOL) suppository 650 mg (has no administration in time range)  polyethylene glycol (MIRALAX / GLYCOLAX) packet 17 g (has no administration in time range)  0.9 %  sodium chloride infusion (has no administration in time range)  labetalol (NORMODYNE) injection 5 mg (has no administration in time range)  iohexol (OMNIPAQUE) 350 MG/ML injection 100 mL (100 mLs Intravenous Contrast Given 10/01/22 0808)  sodium chloride 0.9 % bolus 500 mL (500 mLs Intravenous New Bag/Given 10/01/22 1109)  ondansetron (ZOFRAN) injection 4 mg (4 mg Intravenous Given 10/01/22 1110)    Mobility walks     Focused Assessments Cardiac Assessment Handoff:    Lab Results  Component Value Date   CKTOTAL 73  01/10/2010   CKMB 1.0 01/10/2010   TROPONINI <0.03 06/24/2018   Lab Results  Component Value Date   DDIMER 1.84 (H) 10/25/2020   Does the Patient currently have chest pain? No    R Recommendations: See Admitting Provider Note  Report given to:   Additional Notes: .

## 2022-10-01 NOTE — Consult Note (Signed)
Cardiology Consultation   Patient ID: Peter Marsh MRN: [id number]; DOB: [birth year]  Admit date: 10/01/2022 Date of Consult: 10/01/2022  PCP:  Tracey Harries, MD   [facility] Providers Cardiologist:  Reatha Harps, MD  Electrophysiologist:  Lewayne Bunting, MD       Patient Profile:   Peter Marsh is a 62 y.o. male with a hx of coronary artery disease with a non-STEMI and placement of a drug-eluting stent to the LAD artery in January 2024 who is being seen 10/01/2022 for the evaluation of preoperative cardiovascular risk evaluation in the setting of acute cholecystitis at the request of Dr. Alinda Money.  History of Present Illness:   Peter Marsh is a 62 year old gentleman with multiple medical problems including AAA, severe hypertension, CKD stage IV (sees Dr. Signe Colt),  dual-chamber permanent pacemaker (Medtronic 2018 for AV conduction abnormalities), history of atrial flutter, history of GI bleed preventing anticoagulation, history of schizophrenia, who now presents with 3 to 4 days of colicky intermittent abdominal pain, nausea without vomiting, some diarrhea, no fever or chills.  Today the pain was most severe, woke him from sleep at 3 AM and continued unabated for a much longer period of time.  Surgical evaluation has raised concern for possible acute calculus cholecystitis.  He has a 5 mm stone in the gallbladder, gallbladder wall thickening, positive sonographic Eulah Pont sign, consultation is required since he is on antiplatelet therapy with aspirin and clopidogrel following placement of the LAD artery stent in January.  Visional findings and workup are moderate size infrarenal AAA at 3.9 cm (compared to 3.4 cm in June 2022).  On CT there was no evidence of intramural hematoma, dissection or bleeding.  The pancreas had diffuse parenchymal calcification consistent with chronic pancreatitis.  He also has bilateral nonobstructing nephrolithiasis and kidney cysts that had features  suggesting they were not just simple cysts.  He has occasional chest pain, but it sounds like he is describing discomfort radiating from the abdomen.  Different from his previous angina.  He has numerous other complaints, but denies orthopnea, PND, lower extremity edema or significant exertional dyspnea.  He is however quite sedentary.  He has had some bright red blood per rectum and his fecal occult blood test was positive.  He is quite emotional and reports that he has been hospitalized 14 times in the last year.  Note that his pacemaker system is MRI conditional (Medtronic Azure DR- MRI generator, atrial lead 5076, His bundle lead 3830).  MRIs can be performed with appropriate reprogramming.   Past Medical History:  Diagnosis Date   Allergy    Anxiety    Asthma    uses inhalers    Bilateral carotid bruits    Cardiac conduction disorder 2018   s/p MDT PPM   Cataract    Chest pain 06/24/2018   Chronic kidney disease    bladder interstial cystitis    Chronic kidney disease (CKD), stage IV (severe) ([name])    followed by Dr. Marisue Humble at [facility]   COPD (chronic obstructive pulmonary disease) ([name])    Coronary artery disease    COVID-19 virus infection 10/21/2020   Depression    Diabetes mellitus without complication ([name])    Discord with neighbors, lodgers and landlord 11/26/2021   Encounter for care of pacemaker 02/13/2019   GERD (gastroesophageal reflux disease)    Hematemesis 09/16/2016   History of stomach ulcers 2001   Hypertension    LBBB (left bundle branch block)  Lower extremity edema    Mild intermittent asthma without complication    Mixed hyperlipidemia    Mobitz type 2 second degree AV block 04/06/2019   Neuropathy    Pacemaker: Medtronic Azure XT DR MRI [id number] PPM -  BUNDLE OF HIS pacing  02/26/2017   Scheduled Remote pacemaker check  11/12/2018:  There were 24 Fast AV episodes:  EGMs show SVTs. Episodes lasted < 2 minutes. Health trends do not demonstrate  significant abnormality. Battery longevity is 9.4 - 10.3 years. RA pacing is 47.1 %, RV pacing is 40.4 %.  Clinic check 11/07/17.    Paroxysmal atrial flutter ([name])    PONV (postoperative nausea and vomiting)    Prostatitis    Recurrent upper respiratory infection (URI)    Schizophrenia ([name])    Sinus node dysfunction ([name]) 02/13/2019   Urticaria     Past Surgical History:  Procedure Laterality Date   ADENOIDECTOMY     APPENDECTOMY     COLONOSCOPY     CORONARY STENT INTERVENTION N/A 05/26/2022   Procedure: CORONARY STENT INTERVENTION;  Surgeon: Corky Crafts, MD;  Location: [facility] INVASIVE CV LAB;  Service: Cardiovascular;  Laterality: N/A;   CORONARY ULTRASOUND/IVUS N/A 05/26/2022   Procedure: Intravascular Ultrasound/IVUS;  Surgeon: Corky Crafts, MD;  Location: [facility] INVASIVE CV LAB;  Service: Cardiovascular;  Laterality: N/A;   ELECTROPHYSIOLOGY STUDY N/A 02/26/2017   Procedure: ELECTROPHYSIOLOGY STUDY;  Surgeon: Marinus Maw, MD;  Location: [facility] INVASIVE CV LAB;  Service: Cardiovascular;  Laterality: N/A;   ESOPHAGOGASTRODUODENOSCOPY (EGD) WITH PROPOFOL N/A 09/18/2016   Procedure: ESOPHAGOGASTRODUODENOSCOPY (EGD) WITH PROPOFOL;  Surgeon: Kerin Salen, MD;  Location: [facility] ENDOSCOPY;  Service: Gastroenterology;  Laterality: N/A;   LEFT HEART CATH AND CORONARY ANGIOGRAPHY N/A 05/26/2022   Procedure: LEFT HEART CATH AND CORONARY ANGIOGRAPHY;  Surgeon: Corky Crafts, MD;  Location: [facility] INVASIVE CV LAB;  Service: Cardiovascular;  Laterality: N/A;   LUMBAR LAMINECTOMY/DECOMPRESSION MICRODISCECTOMY  04/19/2011   Procedure: LUMBAR LAMINECTOMY/DECOMPRESSION MICRODISCECTOMY;  Surgeon: Jacki Cones;  Location: [facility] ORS;  Service: Orthopedics;  Laterality: Left;  Hemi LAminectomy/Microdiscectomy Lumbar four  - Lumbar five  on the Left (X-Ray)   PACEMAKER IMPLANT N/A 02/26/2017   Procedure: PACEMAKER IMPLANT;  Surgeon: Marinus Maw, MD;  Location: [facility] INVASIVE CV LAB;  Service:  Cardiovascular;  Laterality: N/A;   TONSILLECTOMY     UPPER GASTROINTESTINAL ENDOSCOPY       Home Medications:  Prior to Admission medications   Medication Sig Start Date End Date Taking? Authorizing Provider  albuterol (VENTOLIN HFA) 108 (90 Base) MCG/ACT inhaler INHALE 2 PUFFS INTO THE LUNGS EVERY 6 HOURS AS NEEDED FOR WHEEZING OR SHORTNESS OF BREATH 07/11/21   Nehemiah Settle, FNP  aspirin 81 MG chewable tablet Chew 1 tablet (81 mg total) by mouth daily. 05/28/22   Osvaldo Shipper, MD  Budeson-Glycopyrrol-Formoterol (BREZTRI AEROSPHERE) 160-9-4.8 MCG/ACT AERO Inhale 2 puffs into the lungs in the morning and at bedtime. 12/09/20   Hetty Blend, FNP  buPROPion (WELLBUTRIN SR) 150 MG 12 hr tablet Take 150 mg by mouth 2 (two) times daily.    [provider]  cetirizine (ZYRTEC) 10 MG tablet Take 1 tablet (10 mg total) by mouth daily. 12/09/20   Hetty Blend, FNP  cloNIDine (CATAPRES) 0.2 MG tablet Take 1 tablet (0.2 mg total) by mouth 3 (three) times daily. 09/19/22   O'NealRonnald Ramp, MD  clopidogrel (PLAVIX) 75 MG tablet Take 1 tablet (75 mg total) by mouth daily. 08/21/22 08/21/23  Sande Rives, MD  docusate sodium (COLACE) 100 MG capsule Take 1 capsule (100 mg total) by mouth 2 (two) times daily. 10/25/20   Leroy Sea, MD  ezetimibe (ZETIA) 10 MG tablet Take 1 tablet (10 mg total) by mouth daily. 07/28/22   Sande Rives, MD  fluticasone (FLONASE) 50 MCG/ACT nasal spray Place 1 spray into both nostrils daily as needed for allergies. 09/08/20   Hetty Blend, FNP  hydrALAZINE (APRESOLINE) 100 MG tablet Take 1 tablet (100 mg total) by mouth 3 (three) times daily. 09/19/22   O'NealRonnald Ramp, MD  isosorbide dinitrate (ISORDIL) 30 MG tablet TAKE 1 TABLET(30 MG) BY MOUTH THREE TIMES DAILY 09/19/22   Ronney Asters, NP  metoprolol succinate (TOPROL-XL) 100 MG 24 hr tablet Take 1 tablet (100 mg total) by mouth 2 (two) times daily. Take with or immediately following a meal.  05/27/22   Osvaldo Shipper, MD  montelukast (SINGULAIR) 10 MG tablet Take 1 tablet (10 mg total) by mouth at bedtime. 12/09/20 05/27/23  Hetty Blend, FNP  nitroGLYCERIN (NITROSTAT) 0.4 MG SL tablet Place 0.4 mg under the tongue every 5 (five) minutes as needed for chest pain.    [provider]  ondansetron (ZOFRAN ODT) 4 MG disintegrating tablet Take 1 tablet (4 mg total) by mouth every 8 (eight) hours as needed for nausea or vomiting. 12/17/20   Tressia Danas, MD  paliperidone (INVEGA) 6 MG 24 hr tablet Take 6 mg by mouth at bedtime.    [provider]  pantoprazole (PROTONIX) 40 MG tablet TAKE 1 TABLET(40 MG) BY MOUTH TWICE DAILY Patient taking differently: Take 40 mg by mouth 2 (two) times daily. 07/06/21   Tressia Danas, MD  Spacer/Aero-Holding Rudean Curt Use as directed with inhaler. 07/12/21   Nehemiah Settle, FNP  sucralfate (CARAFATE) 1 GM/10ML suspension Take 10 mLs (1 g total) by mouth 4 (four) times daily -  with meals and at bedtime. Patient taking differently: Take 1 g by mouth daily as needed (for ulcers). 10/25/20   Leroy Sea, MD    Inpatient Medications: Scheduled Meds:  [START ON 10/02/2022] aspirin  81 mg Oral Daily   Budeson-Glycopyrrol-Formoterol  2 puff Inhalation BID   buPROPion  150 mg Oral BID   cloNIDine  0.2 mg Oral TID   [START ON 10/02/2022] clopidogrel  75 mg Oral Daily   [START ON 10/02/2022] ezetimibe  10 mg Oral Daily   hydrALAZINE  100 mg Oral TID   isosorbide dinitrate  30 mg Oral TID   metoprolol succinate  100 mg Oral BID   montelukast  10 mg Oral QHS   paliperidone  6 mg Oral QHS   pantoprazole  40 mg Oral BID   sodium chloride flush  3 mL Intravenous Q12H   Continuous Infusions:  sodium chloride 75 mL/hr at 10/01/22 1549   PRN Meds: acetaminophen **OR** acetaminophen, albuterol, labetalol, polyethylene glycol, sucralfate  Allergies:    Allergies  Allergen Reactions   Methylpyrrolidone Hives    froze the intestine    Niacin Itching, Nausea And Vomiting and Other (See Comments)    Flushing, itching, tingling    Ace Inhibitors     Other reaction(s): CKD 4   Norvasc [Amlodipine Besylate] Other (See Comments)    Swollen Feet   Other Other (See Comments)   Oxybutynin Chloride [Oxybutynin Chloride Er] Other (See Comments)    froze the intestine   Statins     Reports severe reaction but cannot  remember exactly what it was    Vesicare [Solifenacin Succinate] Other (See Comments)    Froze the intestine    Ciprofloxacin Rash and Other (See Comments)    Felt flushed    Oxybutynin Rash and Other (See Comments)    bowel obst   Solifenacin Rash    Social History:   Social History   Socioeconomic History   Marital status: Married    Spouse name: Not on file   Number of children: 1   Years of education: 12   Highest education level: High school graduate  Occupational History   Occupation: Retired  Tobacco Use   Smoking status: Former    Packs/day: 1.00    Years: 30.00    Additional pack years: 0.00    Total pack years: 30.00    Types: Cigarettes    Quit date: 05/08/2008    Years since quitting: 14.4   Smokeless tobacco: Never  Vaping Use   Vaping Use: Never used  Substance and Sexual Activity   Alcohol use: No   Drug use: No   Sexual activity: Never  Other Topics Concern   Not on file  Social History Narrative   Lives at home with wife.   Right-handed.   One cup caffeine per day.   Social Determinants of Health   Financial Resource Strain: Not on file  Food Insecurity: Not on file  Transportation Needs: Not on file  Physical Activity: Not on file  Stress: Not on file  Social Connections: Not on file  Intimate Partner Violence: Not on file    Family History:    Family History  Problem Relation Age of Onset   High blood pressure Mother    Alzheimer's disease Father    Heart attack Brother    Colon cancer Neg Hx    Esophageal cancer Neg Hx    Rectal cancer Neg Hx    Stomach  cancer Neg Hx      ROS:  Please see the history of present illness.   All other ROS reviewed and negative.     Physical Exam/Data:   Vitals:   10/01/22 1345 10/01/22 1400 10/01/22 1530 10/01/22 1600  BP: (!) 181/99 (!) 191/112 (!) 179/104 (!) 181/118  Pulse: (!) 59 (!) 59 62 61  Resp: 15 19 15 14   Temp:      TempSrc:      SpO2: 97% 99% 96% 98%  Weight:      Height:       No intake or output data in the 24 hours ending 10/01/22 1625    10/01/2022    4:13 AM 09/05/2022    8:53 AM 09/04/2022    3:56 PM  Last 3 Weights  Weight (lbs) 185 lb 194 lb 185 lb  Weight (kg) 83.915 kg 87.998 kg 83.915 kg     Body mass index is 24.41 kg/m.  General:  Well nourished, well developed, in no acute distress; HEENT: normal Neck: no JVD Vascular: No carotid bruits; Distal pulses 2+ bilaterally Cardiac:  normal S1, S2; RRR; no murmur  Lungs:  clear to auscultation bilaterally, no wheezing, rhonchi or rales  Abd: soft, tender RUQ, no hepatomegaly  Ext: no edema Musculoskeletal:  No deformities, BUE and BLE strength normal and equal Skin: warm and dry  Neuro:  CNs 2-12 intact, no focal abnormalities noted Psych: Appears to have depressed affect, easily tearful.  EKG:  The EKG was personally reviewed and demonstrates: Atrial paced, ventricular sensed rhythm with left bundle  branch block. Telemetry:  Telemetry was personally reviewed and demonstrates: Atrial paced, ventricular sensed rhythm, rareAV sequential pacing  Relevant CV Studies: Reviewed normal pacemaker download from 09/25/2022, 99% ApVs   echocardiogram 05/26/2022     1. Left ventricular ejection fraction, by estimation, is 40 to 45%. The  left ventricle has mildly decreased function. The left ventricle  demonstrates regional wall motion abnormalities. Abnormal (paradoxical)  septal motion, consistent with left bundle  branch block. Apical hypokinesis.      There is severe asymmetric left ventricular hypertrophy of the   basal-septal segment. Left ventricular diastolic parameters are consistent  with Grade I diastolic dysfunction (impaired relaxation).   2. Right ventricular systolic function is normal. The right ventricular  size is normal.   3. The mitral valve is normal in structure. Trivial mitral valve  regurgitation. No evidence of mitral stenosis.   4. The aortic valve is tricuspid. Aortic valve regurgitation is not  visualized. No aortic stenosis is present.   cardiac catheterization 05/26/2022  Diagnostic Dominance: Right  Intervention    Implants   Permanent Stent  Synergy Xd 3.50x24       Prox RCA lesion is 25% stenosed.   Prox LAD lesion is 99% stenosed.  A drug-eluting stent was successfully placed using a SYNERGY XD 3.50X24 postdilated to greater than 4 m in diameter and optimized with intravascular ultrasound..   Post intervention, there is a 0% residual stenosis.   LV end diastolic pressure is normal.   There is no aortic valve stenosis.   Successful PCI of the severe lesion in the proximal LAD.  Continue dual antiplatelet therapy for at least 12 months.  Continue aggressive secondary prevention.  Will decrease his dose of isosorbide from 4 times a day down to 3 times a day.  This can gradually be weaned off based on his symptoms.  Watch overnight.  Hydrate aggressively due to renal insufficiency.  Specific emphasis was placed on minimizing contrast.  Only 50 cc of contrast was used for the procedure.  Plan for discharge tomorrow.    Laboratory Data:  High Sensitivity Troponin:  No results for input(s): "TROPONINIHS" in the last 720 hours.   Chemistry Recent Labs  Lab 10/01/22 0420  NA 133*  K 4.6  CL 100  CO2 25  GLUCOSE 104*  BUN 25*  CREATININE 1.94*  CALCIUM 8.7*  GFRNONAA 38*  ANIONGAP 8    Recent Labs  Lab 10/01/22 0420  PROT 6.9  ALBUMIN 3.7  AST 23  ALT 17  ALKPHOS 66  BILITOT 0.8   Lipids No results for input(s): "CHOL", "TRIG", "HDL", "LABVLDL",  "LDLCALC", "CHOLHDL" in the last 168 hours.  Hematology Recent Labs  Lab 10/01/22 0420  WBC 8.2  RBC 4.78  HGB 13.4  HCT 42.5  MCV 88.9  MCH 28.0  MCHC 31.5  RDW 12.5  PLT 210   Thyroid No results for input(s): "TSH", "FREET4" in the last 168 hours.  BNPNo results for input(s): "BNP", "PROBNP" in the last 168 hours.  DDimer No results for input(s): "DDIMER" in the last 168 hours.   Radiology/Studies:  US Abdomen Limited RUQ (LIVER/GB)  Result Date: 10/01/2022 CLINICAL DATA:  Gallstones. EXAM: ULTRASOUND ABDOMEN LIMITED RIGHT UPPER QUADRANT COMPARISON:  CT from earlier today FINDINGS: Gallbladder: 5 mm stone identified within the gallbladder. The gallbladder appears partially decompressed with wall thickening measuring up to 6.6 mm. Positive sonographic Murphy's sign was reported by the sonographer. Common bile duct: Diameter: 3.1 Liver: No focal lesion  identified. Within normal limits in parenchymal echogenicity. Portal vein is patent on color Doppler imaging with normal direction of blood flow towards the liver. Other: Diminished exam detail due to overlying bowel gas. IMPRESSION: Gallstone with gallbladder wall thickening and positive sonographic Murphy's sign. Cannot exclude acute cholecystitis. Electronically Signed   By: Signa Kell M.D.   On: 10/01/2022 10:18   CT Angio Chest/Abd/Pel for Dissection W and/or W/WO  Result Date: 10/01/2022 CLINICAL DATA:  Acute aortic syndrome. EXAM: CT ANGIOGRAPHY CHEST, ABDOMEN AND PELVIS TECHNIQUE: Non-contrast CT of the chest was initially obtained. Multidetector CT imaging through the chest, abdomen and pelvis was performed using the standard protocol during bolus administration of intravenous contrast. Multiplanar reconstructed images and MIPs were obtained and reviewed to evaluate the vascular anatomy. RADIATION DOSE REDUCTION: This exam was performed according to the departmental dose-optimization program which includes automated exposure  control, adjustment of the mA and/or kV according to patient size and/or use of iterative reconstruction technique. CONTRAST:  OMNIPAQUE IOHEXOL 350 MG/ML SOLN COMPARISON:  Abdomen pelvis CT 10/21/2020 FINDINGS: CTA CHEST FINDINGS Cardiovascular: The heart size is normal. No substantial pericardial effusion. Coronary artery calcification is evident. Coronary stent device evident. Mild atherosclerotic calcification is noted in the wall of the thoracic aorta. Left-sided permanent pacemaker evident. Precontrast imaging shows no hyperdense crescent in the wall of the thoracic aorta to suggest the presence of an acute intramural hematoma. No thoracic aortic aneurysm. Imaging after IV contrast administration shows no dissection of the thoracic aorta. Sagittal imaging reveals a penetrating ulcer or ulcerated plaque along the under surface of the transverse aorta measuring on the order of 11 x 8 mm. This is also seen on axial image 43/6. Mediastinum/Nodes: No mediastinal lymphadenopathy. No evidence for gross hilar lymphadenopathy although assessment is limited by the lack of intravenous contrast on the current study. The esophagus has normal imaging features. There is no axillary lymphadenopathy. Lungs/Pleura: Centrilobular and paraseptal emphysema evident. No suspicious pulmonary nodule or mass. No focal airspace consolidation. No pleural effusion. Musculoskeletal: No worrisome lytic or sclerotic osseous abnormality. Review of the MIP images confirms the above findings. CTA ABDOMEN AND PELVIS FINDINGS VASCULAR Aorta: Infrarenal abdominal aorta measures 3.9 x 3.7 cm in maximum orthogonal diameter. Atherosclerotic changes evident with prominent mural thrombus in the aneurysmal segment. No dissection, vasculitis or significant stenosis. Celiac: Patent without evidence of aneurysm, dissection, vasculitis or significant stenosis. SMA: Patent without evidence of aneurysm, dissection, vasculitis or significant stenosis.  Renals: Both renal arteries are patent without evidence of aneurysm, dissection, vasculitis, fibromuscular dysplasia or significant stenosis. IMA: Chronic occlusion Inflow: Patent without evidence of aneurysm, dissection, vasculitis or significant stenosis. Veins: No obvious venous abnormality within the limitations of this arterial phase study. Review of the MIP images confirms the above findings. NON-VASCULAR Hepatobiliary: No suspicious focal abnormality within the liver parenchyma. Gallbladder is decompressed which accentuates wall thickness gallbladder wall is ill-defined raising the question of pericholecystic edema. 6 mm gallstone evident. No intrahepatic or extrahepatic biliary dilation. Pancreas: Diffuse parenchymal calcification consistent with chronic pancreatitis. No main duct dilatation. Spleen: No splenomegaly. No focal mass lesion. Adrenals/Urinary Tract: No adrenal nodule or mass. Large central sinus cysts noted left kidney. There is dilatation of an upper pole calyx, similar to CT scan from 09/25/2014, potentially due to mass-effect from the large central sinus cyst. Overlying cortical thinning right kidney with levin mm stone upper interpolar region and 10 mm stone in the lower interpolar region. No associated right hydroureter. Component of right UPJ obstruction  cannot be excluded. 5.5 cm exophytic low-density lesion upper pole left kidney has increased from 4.7 cm on the 2022 exam. 15 mm subcapsular lesion upper pole left kidney is similar to prior. Both of these lesions have attenuation slightly higher than would be expected for simple cyst. 8 mm nonobstructing stone identified lower pole left kidney. Left ureter unremarkable. The urinary bladder appears normal for the degree of distention. Stomach/Bowel: Stomach is unremarkable. No gastric wall thickening. No evidence of outlet obstruction. Duodenum is normally positioned as is the ligament of Treitz. No small bowel wall thickening. No small  bowel dilatation. The terminal ileum is normal. The appendix is not well visualized, but there is no edema or inflammation in the region of the cecum. No gross colonic mass. No colonic wall thickening. Lymphatic: There is no gastrohepatic or hepatoduodenal ligament lymphadenopathy. No retroperitoneal or mesenteric lymphadenopathy. No pelvic sidewall lymphadenopathy. Reproductive: The prostate gland and seminal vesicles are unremarkable. Other: No intraperitoneal free fluid. Musculoskeletal: No worrisome lytic or sclerotic osseous abnormality. Review of the MIP images confirms the above findings. IMPRESSION: 1. No evidence for acute intramural hematoma or dissection in the thoracoabdominal aorta. 2. 11 x 8 mm penetrating ulcer or ulcerated plaque along the under surface of the transverse aorta. 3. 3.9 x 3.7 cm infrarenal abdominal aortic aneurysm. 4. Gallbladder is decompressed which accentuates wall thickness. Gallbladder wall is ill-defined raising the question of pericholecystic edema. There is a 6 mm gallstone evident. Right upper quadrant ultrasound may prove helpful to further evaluate. 5. Large central sinus cyst left kidney with dilatation of an upper pole calyx, similar to CT scan from 09/25/2014, potentially due to mass-effect from the large central sinus cyst. Component of right UPJ obstruction cannot be excluded. 6. Bilateral nonobstructing nephrolithiasis. 7. 5.5 cm exophytic low-density lesion upper pole left kidney has increased from 4.7 cm on the 2022 exam. Another smaller upper pole left renal lesion is also mildly progressed. Both of these lesions have attenuation slightly higher than would be expected for simple cysts. While likely cysts complicated by proteinaceous debris or hemorrhage. Given the presence of permanent pacemaker, follow-up CT abdomen with and without contrast may be warranted to exclude hypoenhancing neoplasm. 8. Chronic pancreatitis. 9.  Emphysema (ICD10-J43.9). Electronically  Signed   By: Kennith Center M.D.   On: 10/01/2022 08:51   US Aorta  Result Date: 10/01/2022 CLINICAL DATA:  Abdominal pain. History of abdominal aortic aneurysm. EXAM: ULTRASOUND OF ABDOMINAL AORTA TECHNIQUE: Ultrasound examination of the abdominal aorta and proximal common iliac arteries was performed to evaluate for aneurysm. Additional color and Doppler images of the distal aorta were obtained to document patency. COMPARISON:  CT abdomen/pelvis 10/21/2020 FINDINGS: Abdominal aortic measurements as follows: Proximal:  2.7 x 2.9 cm Mid:  2.1 x 2.0 cm Distal:  3.5 x 3.9 cm Patent: Yes, peak systolic velocity is 63.6 cm/s Right common iliac artery: 1.0 x 1.1 cm Left common iliac artery: 0.7 x 1.1 cm IMPRESSION: 3.9 cm abdominal aortic aneurysm. Infrarenal abdominal aorta was 3.4 cm maximum diameter when remeasured on the study from 10/21/2020 compatible with mild interval progression. Ultrasound can be insensitive for acute aortic aneurysm leak and dissection. Recommend follow-up ultrasound every 2 years. This recommendation follows ACR consensus guidelines: White Paper of the ACR Incidental Findings Committee II on Vascular Findings. J Am Coll Radiol 2013; 10:789-794. Electronically Signed   By: Kennith Center M.D.   On: 10/01/2022 07:30     Assessment and Plan:   CAD: Unfortunately his coronary  lesion was in the proximal LAD artery, had substantial impact on LV function (EF dropped to 40-45% despite the absence of a big increase in troponin) and was treated in the setting of an acute coronary syndrome.  Stopping dual antiplatelet therapy prematurely with there for placing him at substantial risk of serious complications.  The initial plan was to continue dual antiplatelet therapy for at least 12 months.  I think it will be important not to interrupt the aspirin and clopidogrel even temporarily for the first 6 months (that would be until mid July).  Otherwise, as far as I can tell he does not have any active  coronary problems.  ECG is not diagnostic due to left bundle branch block. LV Dysfunction: Clinically he does not have heart failure.  Suspect the reduction in LVEF was transient during his acute coronary event.  The troponin increased very slightly.  Will repeat echo to see if there has been recovery of LV function. 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 Pacemaker: Normal device function.  He has an MRI conditional system.  With appropriate reprogramming he can undergo MRI for his abdominal pathology. CKD stage IV: Creatinine has been stable in the 1.9-2.2 range over the last couple of years.  His nephrologist is Dr. Signe Colt. HTN: Overly controlled with particularly high diastolic blood pressure.  He reports that this has been the case even when he was not in pain from his gallbladder.  Part of the increased blood pressure today may be interruption of clonidine and metoprolol, which both can cause serious rebound hypertension.  If he is unable to take medications orally, suggest intravenous metoprolol tartrate dosed every 8 hours and a clonidine patch. AAA: Slow progression compared to 2022.  Not likely to be responsible for his current abdominal symptoms. Acute cholecystitis: Unfortunately he is at high risk for major cardiovascular complications with abdominal surgery and premature interruption of dual antiplatelet therapy.  If the surgery team thinks that cholecystectomy can be safely done without interrupting dual antiplatelet therapy, I think his heart is healthy enough to go through that.  If surgery cannot be performed without interruption of antiplatelet therapy, it may be best to temporize the situation with a percutaneous cholecystostomy tube.   Risk Assessment/Risk Scores:                For questions or updates, please  contact [facility] Please consult [email or web address] for contact info under    Signed, Thurmon Fair, MD  10/01/2022 4:25 PM

## 2022-10-01 NOTE — ED Notes (Signed)
Patient transported to CT 

## 2022-10-01 NOTE — H&P (Signed)
History and Physical   Peter Marsh ZOX:[id number] DOB: [birth year] DOA: 10/01/2022  PCP: Tracey Harries, MD   Patient coming from: Home  Chief Complaint: Abdominal pain  HPI: Peter Marsh is a 62 y.o. male with medical history significant of CHF, atrial flutter, second-degree AV block type II, sinus node dysfunction, status post pacemaker, CAD, chronic pancreatitis, asthma-COPD overlap, schizophrenia, SBO, hypertension, hyperlipidemia, PUD, GERD, hepatitis C, CKD 3B, AAA presenting with abdominal pain.  Patient reports 1 week of intermittent epigastric/umbilical abdominal pain.  He states that it comes and goes without any known triggers.  Typically last minutes but has lasted longer at times.  He states that this morning he woke up at 3 AM with severe pain that has lasted for over an hour prompted him to come to the ED for further evaluation.  Pain had improved by the time he arrived to the ED.  Additionally reports his last bowel movement was 4 days ago but has had some bright red blood per rectum.  Of note, does have history of AAA as above.  Denies fevers, chills, chest pain, shortness of breath, nausea, vomiting.  ED Course: Vital signs in the ED notable for blood pressure in the 160s to 200s systolic.  Lab workup included CMP with sodium 133, BUN 25, creatinine stable at 1.94, glucose 104, calcium 8.7.  CBC within normal limits.  FOBT positive.  Lipase normal.  Urinalysis normal.  Aortic ultrasound showed AAA with diameter of 3.9 up from previous of 3.4.  CTA of the chest abdomen pelvis showed no significant hematoma or dissection.  There was a transverse aortic penetrating ulcer noted, also noted was his AAA measuring 3.9 x 3.7 infrarenal, gallbladder was noted to have changes that could be consistent with edema/cholecystitis recommending ultrasound follow-up.  Renal cysts were also noted possibly with an obstructive component to one of the calyces.  Additional left renal cyst noted that  favored simple cyst.  Right upper quadrant ultrasound was performed which showed positive sonographic Murphy sign and wall thickening unable to exclude acute cholecystitis.  General surgery consulted by EDP and they will see the patient.  Review of Systems: As per HPI otherwise all other systems reviewed and are negative.  Past Medical History:  Diagnosis Date   Allergy    Anxiety    Asthma    uses inhalers    Bilateral carotid bruits    Cardiac conduction disorder 2018   s/p MDT PPM   Cataract    Chest pain 06/24/2018   Chronic kidney disease    bladder interstial cystitis    Chronic kidney disease (CKD), stage IV (severe) ([name])    followed by Dr. Marisue Humble at [facility]   COPD (chronic obstructive pulmonary disease) ([name])    Coronary artery disease    COVID-19 virus infection 10/21/2020   Depression    Diabetes mellitus without complication ([name])    Discord with neighbors, lodgers and landlord 11/26/2021   Encounter for care of pacemaker 02/13/2019   GERD (gastroesophageal reflux disease)    Hematemesis 09/16/2016   History of stomach ulcers 2001   Hypertension    LBBB (left bundle branch block)    Lower extremity edema    Mild intermittent asthma without complication    Mixed hyperlipidemia    Mobitz type 2 second degree AV block 04/06/2019   Neuropathy    Pacemaker: Medtronic Azure XT DR MRI [id number] PPM -  BUNDLE OF HIS pacing  02/26/2017   Scheduled Remote pacemaker check  11/12/2018:  There were 24 Fast AV episodes:  EGMs show SVTs. Episodes lasted < 2 minutes. Health trends do not demonstrate significant abnormality. Battery longevity is 9.4 - 10.3 years. RA pacing is 47.1 %, RV pacing is 40.4 %.  Clinic check 11/07/17.    Paroxysmal atrial flutter ([name])    PONV (postoperative nausea and vomiting)    Prostatitis    Recurrent upper respiratory infection (URI)    Schizophrenia ([name])    Sinus node dysfunction ([name]) 02/13/2019   Urticaria     Past Surgical History:   Procedure Laterality Date   ADENOIDECTOMY     APPENDECTOMY     COLONOSCOPY     CORONARY STENT INTERVENTION N/A 05/26/2022   Procedure: CORONARY STENT INTERVENTION;  Surgeon: Corky Crafts, MD;  Location: [facility] INVASIVE CV LAB;  Service: Cardiovascular;  Laterality: N/A;   CORONARY ULTRASOUND/IVUS N/A 05/26/2022   Procedure: Intravascular Ultrasound/IVUS;  Surgeon: Corky Crafts, MD;  Location: [facility] INVASIVE CV LAB;  Service: Cardiovascular;  Laterality: N/A;   ELECTROPHYSIOLOGY STUDY N/A 02/26/2017   Procedure: ELECTROPHYSIOLOGY STUDY;  Surgeon: Marinus Maw, MD;  Location: [facility] INVASIVE CV LAB;  Service: Cardiovascular;  Laterality: N/A;   ESOPHAGOGASTRODUODENOSCOPY (EGD) WITH PROPOFOL N/A 09/18/2016   Procedure: ESOPHAGOGASTRODUODENOSCOPY (EGD) WITH PROPOFOL;  Surgeon: Kerin Salen, MD;  Location: [facility] ENDOSCOPY;  Service: Gastroenterology;  Laterality: N/A;   LEFT HEART CATH AND CORONARY ANGIOGRAPHY N/A 05/26/2022   Procedure: LEFT HEART CATH AND CORONARY ANGIOGRAPHY;  Surgeon: Corky Crafts, MD;  Location: [facility] INVASIVE CV LAB;  Service: Cardiovascular;  Laterality: N/A;   LUMBAR LAMINECTOMY/DECOMPRESSION MICRODISCECTOMY  04/19/2011   Procedure: LUMBAR LAMINECTOMY/DECOMPRESSION MICRODISCECTOMY;  Surgeon: Jacki Cones;  Location: [facility] ORS;  Service: Orthopedics;  Laterality: Left;  Hemi LAminectomy/Microdiscectomy Lumbar four  - Lumbar five  on the Left (X-Ray)   PACEMAKER IMPLANT N/A 02/26/2017   Procedure: PACEMAKER IMPLANT;  Surgeon: Marinus Maw, MD;  Location: [facility] INVASIVE CV LAB;  Service: Cardiovascular;  Laterality: N/A;   TONSILLECTOMY     UPPER GASTROINTESTINAL ENDOSCOPY      Social History  reports that he quit smoking about 14 years ago. His smoking use included cigarettes. He has a 30.00 pack-year smoking history. He has never used smokeless tobacco. He reports that he does not drink alcohol and does not use drugs.  Allergies  Allergen Reactions    Methylpyrrolidone Hives    froze the intestine   Niacin Itching, Nausea And Vomiting and Other (See Comments)    Flushing, itching, tingling    Ace Inhibitors     Other reaction(s): CKD 4   Norvasc [Amlodipine Besylate] Other (See Comments)    Swollen Feet   Other Other (See Comments)   Oxybutynin Chloride [Oxybutynin Chloride Er] Other (See Comments)    froze the intestine   Statins     Reports severe reaction but cannot remember exactly what it was    Vesicare [Solifenacin Succinate] Other (See Comments)    Froze the intestine    Ciprofloxacin Rash and Other (See Comments)    Felt flushed    Oxybutynin Rash and Other (See Comments)    bowel obst   Solifenacin Rash    Family History  Problem Relation Age of Onset   High blood pressure Mother    Alzheimer's disease Father    Heart attack Brother    Colon cancer Neg Hx    Esophageal cancer Neg Hx    Rectal cancer Neg Hx    Stomach cancer  Neg Hx   Reviewed on admission  Prior to Admission medications   Medication Sig Start Date End Date Taking? Authorizing Provider  albuterol (VENTOLIN HFA) 108 (90 Base) MCG/ACT inhaler INHALE 2 PUFFS INTO THE LUNGS EVERY 6 HOURS AS NEEDED FOR WHEEZING OR SHORTNESS OF BREATH 07/11/21   Nehemiah Settle, FNP  aspirin 81 MG chewable tablet Chew 1 tablet (81 mg total) by mouth daily. 05/28/22   Osvaldo Shipper, MD  Budeson-Glycopyrrol-Formoterol (BREZTRI AEROSPHERE) 160-9-4.8 MCG/ACT AERO Inhale 2 puffs into the lungs in the morning and at bedtime. 12/09/20   Hetty Blend, FNP  buPROPion (WELLBUTRIN SR) 150 MG 12 hr tablet Take 150 mg by mouth 2 (two) times daily.    [provider]  cetirizine (ZYRTEC) 10 MG tablet Take 1 tablet (10 mg total) by mouth daily. 12/09/20   Hetty Blend, FNP  cloNIDine (CATAPRES) 0.2 MG tablet Take 1 tablet (0.2 mg total) by mouth 3 (three) times daily. 09/19/22   O'NealRonnald Ramp, MD  clopidogrel (PLAVIX) 75 MG tablet Take 1 tablet (75 mg total) by mouth  daily. 08/21/22 08/21/23  O'NealRonnald Ramp, MD  docusate sodium (COLACE) 100 MG capsule Take 1 capsule (100 mg total) by mouth 2 (two) times daily. 10/25/20   Leroy Sea, MD  ezetimibe (ZETIA) 10 MG tablet Take 1 tablet (10 mg total) by mouth daily. 07/28/22   Sande Rives, MD  fluticasone (FLONASE) 50 MCG/ACT nasal spray Place 1 spray into both nostrils daily as needed for allergies. 09/08/20   Hetty Blend, FNP  hydrALAZINE (APRESOLINE) 100 MG tablet Take 1 tablet (100 mg total) by mouth 3 (three) times daily. 09/19/22   O'NealRonnald Ramp, MD  isosorbide dinitrate (ISORDIL) 30 MG tablet TAKE 1 TABLET(30 MG) BY MOUTH THREE TIMES DAILY 09/19/22   Ronney Asters, NP  metoprolol succinate (TOPROL-XL) 100 MG 24 hr tablet Take 1 tablet (100 mg total) by mouth 2 (two) times daily. Take with or immediately following a meal. 05/27/22   Osvaldo Shipper, MD  montelukast (SINGULAIR) 10 MG tablet Take 1 tablet (10 mg total) by mouth at bedtime. 12/09/20 05/27/23  Hetty Blend, FNP  nitroGLYCERIN (NITROSTAT) 0.4 MG SL tablet Place 0.4 mg under the tongue every 5 (five) minutes as needed for chest pain.    [provider]  ondansetron (ZOFRAN ODT) 4 MG disintegrating tablet Take 1 tablet (4 mg total) by mouth every 8 (eight) hours as needed for nausea or vomiting. 12/17/20   Tressia Danas, MD  paliperidone (INVEGA) 6 MG 24 hr tablet Take 6 mg by mouth at bedtime.    [provider]  pantoprazole (PROTONIX) 40 MG tablet TAKE 1 TABLET(40 MG) BY MOUTH TWICE DAILY Patient taking differently: Take 40 mg by mouth 2 (two) times daily. 07/06/21   Tressia Danas, MD  Spacer/Aero-Holding Rudean Curt Use as directed with inhaler. 07/12/21   Nehemiah Settle, FNP  sucralfate (CARAFATE) 1 GM/10ML suspension Take 10 mLs (1 g total) by mouth 4 (four) times daily -  with meals and at bedtime. Patient taking differently: Take 1 g by mouth daily as needed (for ulcers). 10/25/20   Leroy Sea, MD    Physical Exam: Vitals:   10/01/22 0945 10/01/22 1100 10/01/22 1115 10/01/22 1130  BP:  (!) 201/115  (!) 196/114  Pulse: 61 65 60 66  Resp: 13 13 19 17   Temp:      TempSrc:      SpO2: 100% 100%  100% 100%  Weight:      Height:        Physical Exam Constitutional:      General: He is not in acute distress.    Appearance: Normal appearance. He is obese.  HENT:     Head: Normocephalic and atraumatic.     Mouth/Throat:     Mouth: Mucous membranes are moist.     Pharynx: Oropharynx is clear.  Eyes:     Extraocular Movements: Extraocular movements intact.     Pupils: Pupils are equal, round, and reactive to light.  Cardiovascular:     Rate and Rhythm: Normal rate and regular rhythm.     Pulses: Normal pulses.     Heart sounds: Normal heart sounds.  Pulmonary:     Effort: Pulmonary effort is normal. No respiratory distress.     Breath sounds: Normal breath sounds.  Abdominal:     General: Bowel sounds are normal. There is no distension.     Palpations: Abdomen is soft.     Tenderness: There is abdominal tenderness.  Musculoskeletal:        General: No swelling or deformity.  Skin:    General: Skin is warm and dry.  Neurological:     General: No focal deficit present.     Mental Status: Mental status is at baseline.    Labs on Admission: I have personally reviewed following labs and imaging studies  CBC: Recent Labs  Lab 10/01/22 0420  WBC 8.2  HGB 13.4  HCT 42.5  MCV 88.9  PLT 210    Basic Metabolic Panel: Recent Labs  Lab 10/01/22 0420  NA 133*  K 4.6  CL 100  CO2 25  GLUCOSE 104*  BUN 25*  CREATININE 1.94*  CALCIUM 8.7*    GFR: Estimated Creatinine Clearance: 44 mL/min (A) (by C-G formula based on SCr of 1.94 mg/dL (H)).  Liver Function Tests: Recent Labs  Lab 10/01/22 0420  AST 23  ALT 17  ALKPHOS 66  BILITOT 0.8  PROT 6.9  ALBUMIN 3.7    Urine analysis:    Component Value Date/Time   COLORURINE YELLOW 10/01/2022 0420    APPEARANCEUR CLEAR 10/01/2022 0420   LABSPEC 1.009 10/01/2022 0420   PHURINE 6.0 10/01/2022 0420   GLUCOSEU NEGATIVE 10/01/2022 0420   HGBUR NEGATIVE 10/01/2022 0420   BILIRUBINUR NEGATIVE 10/01/2022 0420   BILIRUBINUR neg 09/20/2020 1024   KETONESUR NEGATIVE 10/01/2022 0420   PROTEINUR NEGATIVE 10/01/2022 0420   UROBILINOGEN 0.2 09/20/2020 1024   UROBILINOGEN 0.2 04/17/2011 1030   NITRITE NEGATIVE 10/01/2022 0420   LEUKOCYTESUR NEGATIVE 10/01/2022 0420    Radiological Exams on Admission: US Abdomen Limited RUQ (LIVER/GB)  Result Date: 10/01/2022 CLINICAL DATA:  Gallstones. EXAM: ULTRASOUND ABDOMEN LIMITED RIGHT UPPER QUADRANT COMPARISON:  CT from earlier today FINDINGS: Gallbladder: 5 mm stone identified within the gallbladder. The gallbladder appears partially decompressed with wall thickening measuring up to 6.6 mm. Positive sonographic Murphy's sign was reported by the sonographer. Common bile duct: Diameter: 3.1 Liver: No focal lesion identified. Within normal limits in parenchymal echogenicity. Portal vein is patent on color Doppler imaging with normal direction of blood flow towards the liver. Other: Diminished exam detail due to overlying bowel gas. IMPRESSION: Gallstone with gallbladder wall thickening and positive sonographic Murphy's sign. Cannot exclude acute cholecystitis. Electronically Signed   By: Signa Kell M.D.   On: 10/01/2022 10:18   CT Angio Chest/Abd/Pel for Dissection W and/or W/WO  Result Date: 10/01/2022 CLINICAL DATA:  Acute  aortic syndrome. EXAM: CT ANGIOGRAPHY CHEST, ABDOMEN AND PELVIS TECHNIQUE: Non-contrast CT of the chest was initially obtained. Multidetector CT imaging through the chest, abdomen and pelvis was performed using the standard protocol during bolus administration of intravenous contrast. Multiplanar reconstructed images and MIPs were obtained and reviewed to evaluate the vascular anatomy. RADIATION DOSE REDUCTION: This exam was performed according  to the departmental dose-optimization program which includes automated exposure control, adjustment of the mA and/or kV according to patient size and/or use of iterative reconstruction technique. CONTRAST:  OMNIPAQUE IOHEXOL 350 MG/ML SOLN COMPARISON:  Abdomen pelvis CT 10/21/2020 FINDINGS: CTA CHEST FINDINGS Cardiovascular: The heart size is normal. No substantial pericardial effusion. Coronary artery calcification is evident. Coronary stent device evident. Mild atherosclerotic calcification is noted in the wall of the thoracic aorta. Left-sided permanent pacemaker evident. Precontrast imaging shows no hyperdense crescent in the wall of the thoracic aorta to suggest the presence of an acute intramural hematoma. No thoracic aortic aneurysm. Imaging after IV contrast administration shows no dissection of the thoracic aorta. Sagittal imaging reveals a penetrating ulcer or ulcerated plaque along the under surface of the transverse aorta measuring on the order of 11 x 8 mm. This is also seen on axial image 43/6. Mediastinum/Nodes: No mediastinal lymphadenopathy. No evidence for gross hilar lymphadenopathy although assessment is limited by the lack of intravenous contrast on the current study. The esophagus has normal imaging features. There is no axillary lymphadenopathy. Lungs/Pleura: Centrilobular and paraseptal emphysema evident. No suspicious pulmonary nodule or mass. No focal airspace consolidation. No pleural effusion. Musculoskeletal: No worrisome lytic or sclerotic osseous abnormality. Review of the MIP images confirms the above findings. CTA ABDOMEN AND PELVIS FINDINGS VASCULAR Aorta: Infrarenal abdominal aorta measures 3.9 x 3.7 cm in maximum orthogonal diameter. Atherosclerotic changes evident with prominent mural thrombus in the aneurysmal segment. No dissection, vasculitis or significant stenosis. Celiac: Patent without evidence of aneurysm, dissection, vasculitis or significant stenosis. SMA: Patent  without evidence of aneurysm, dissection, vasculitis or significant stenosis. Renals: Both renal arteries are patent without evidence of aneurysm, dissection, vasculitis, fibromuscular dysplasia or significant stenosis. IMA: Chronic occlusion Inflow: Patent without evidence of aneurysm, dissection, vasculitis or significant stenosis. Veins: No obvious venous abnormality within the limitations of this arterial phase study. Review of the MIP images confirms the above findings. NON-VASCULAR Hepatobiliary: No suspicious focal abnormality within the liver parenchyma. Gallbladder is decompressed which accentuates wall thickness gallbladder wall is ill-defined raising the question of pericholecystic edema. 6 mm gallstone evident. No intrahepatic or extrahepatic biliary dilation. Pancreas: Diffuse parenchymal calcification consistent with chronic pancreatitis. No main duct dilatation. Spleen: No splenomegaly. No focal mass lesion. Adrenals/Urinary Tract: No adrenal nodule or mass. Large central sinus cysts noted left kidney. There is dilatation of an upper pole calyx, similar to CT scan from 09/25/2014, potentially due to mass-effect from the large central sinus cyst. Overlying cortical thinning right kidney with levin mm stone upper interpolar region and 10 mm stone in the lower interpolar region. No associated right hydroureter. Component of right UPJ obstruction cannot be excluded. 5.5 cm exophytic low-density lesion upper pole left kidney has increased from 4.7 cm on the 2022 exam. 15 mm subcapsular lesion upper pole left kidney is similar to prior. Both of these lesions have attenuation slightly higher than would be expected for simple cyst. 8 mm nonobstructing stone identified lower pole left kidney. Left ureter unremarkable. The urinary bladder appears normal for the degree of distention. Stomach/Bowel: Stomach is unremarkable. No gastric wall thickening. No evidence  of outlet obstruction. Duodenum is normally  positioned as is the ligament of Treitz. No small bowel wall thickening. No small bowel dilatation. The terminal ileum is normal. The appendix is not well visualized, but there is no edema or inflammation in the region of the cecum. No gross colonic mass. No colonic wall thickening. Lymphatic: There is no gastrohepatic or hepatoduodenal ligament lymphadenopathy. No retroperitoneal or mesenteric lymphadenopathy. No pelvic sidewall lymphadenopathy. Reproductive: The prostate gland and seminal vesicles are unremarkable. Other: No intraperitoneal free fluid. Musculoskeletal: No worrisome lytic or sclerotic osseous abnormality. Review of the MIP images confirms the above findings. IMPRESSION: 1. No evidence for acute intramural hematoma or dissection in the thoracoabdominal aorta. 2. 11 x 8 mm penetrating ulcer or ulcerated plaque along the under surface of the transverse aorta. 3. 3.9 x 3.7 cm infrarenal abdominal aortic aneurysm. 4. Gallbladder is decompressed which accentuates wall thickness. Gallbladder wall is ill-defined raising the question of pericholecystic edema. There is a 6 mm gallstone evident. Right upper quadrant ultrasound may prove helpful to further evaluate. 5. Large central sinus cyst left kidney with dilatation of an upper pole calyx, similar to CT scan from 09/25/2014, potentially due to mass-effect from the large central sinus cyst. Component of right UPJ obstruction cannot be excluded. 6. Bilateral nonobstructing nephrolithiasis. 7. 5.5 cm exophytic low-density lesion upper pole left kidney has increased from 4.7 cm on the 2022 exam. Another smaller upper pole left renal lesion is also mildly progressed. Both of these lesions have attenuation slightly higher than would be expected for simple cysts. While likely cysts complicated by proteinaceous debris or hemorrhage. Given the presence of permanent pacemaker, follow-up CT abdomen with and without contrast may be warranted to exclude hypoenhancing  neoplasm. 8. Chronic pancreatitis. 9.  Emphysema (ICD10-J43.9). Electronically Signed   By: Kennith Center M.D.   On: 10/01/2022 08:51   US Aorta  Result Date: 10/01/2022 CLINICAL DATA:  Abdominal pain. History of abdominal aortic aneurysm. EXAM: ULTRASOUND OF ABDOMINAL AORTA TECHNIQUE: Ultrasound examination of the abdominal aorta and proximal common iliac arteries was performed to evaluate for aneurysm. Additional color and Doppler images of the distal aorta were obtained to document patency. COMPARISON:  CT abdomen/pelvis 10/21/2020 FINDINGS: Abdominal aortic measurements as follows: Proximal:  2.7 x 2.9 cm Mid:  2.1 x 2.0 cm Distal:  3.5 x 3.9 cm Patent: Yes, peak systolic velocity is 63.6 cm/s Right common iliac artery: 1.0 x 1.1 cm Left common iliac artery: 0.7 x 1.1 cm IMPRESSION: 3.9 cm abdominal aortic aneurysm. Infrarenal abdominal aorta was 3.4 cm maximum diameter when remeasured on the study from 10/21/2020 compatible with mild interval progression. Ultrasound can be insensitive for acute aortic aneurysm leak and dissection. Recommend follow-up ultrasound every 2 years. This recommendation follows ACR consensus guidelines: White Paper of the ACR Incidental Findings Committee II on Vascular Findings. J Am Coll Radiol 2013; 10:789-794. Electronically Signed   By: Kennith Center M.D.   On: 10/01/2022 07:30    EKG: Independently reviewed.  Paced rhythm at 61 bpm, appearance of left bundle branch block.  Nonspecific T wave changes.  Similar to previous.  Assessment/Plan Principal Problem:   Acute cholecystitis Active Problems:   Sinus node dysfunction ([name])   Mobitz type 2 second degree AV block   Aneurysm of infrarenal abdominal aorta ([name])   Chronic kidney disease, stage 3b ([name])   Schizophrenia ([name])   Essential hypertension   Pacemaker: Medtronic Azure XT DR MRI [id number] PPM -  BUNDLE OF HIS pacing  GERD (gastroesophageal reflux disease)   Hypertensive heart and chronic kidney disease  with heart failure and stage 1 through stage 4 chronic kidney disease, or chronic kidney disease ([name])   Asthma-COPD overlap syndrome   Gastric ulcer, unspecified as acute or chronic, without hemorrhage or perforation   Hyperlipidemia   Paroxysmal atrial flutter ([name])   Coronary artery disease involving native coronary artery of native heart without angina pectoris   Acute cholecystitis > Patient presenting with abdominal pain that has been intermittent for the past week. > Imaging included CTA without evidence of dissection and aneurysm appearing relatively stable though is increased from previous. > CT did demonstrate changes consistent with cholecystitis.  Follow-up ultrasound confirmed wall thickening and possible cholecystitis with positive sonographic Murphy's. > Pain has improved in the ED and has received fluids and Zofran.  General surgery consulted as well. - Monitor on telemetry overnight - Appreciate general surgery recommendations - Diet sips with meds for now - Supportive care - Add on pain control if needed - Gentle IV fluids considering history of CHF  Abdominal aortic aneurysm > Known infrarenal abdominal aortic aneurysm.  Today was checked and found to be 3.9 cm from previous measurement of 3.4 in 2022. > CT angiogram showed no evidence of dissection.  Nor intramural hematoma.  Sagittal imaging did reveal a penetrating ulcer or ulcerated plaque along the undersurface of the transverse aorta measuring 11 x 8 mm. - Continue to monitor - Work on blood pressure control  CT abnormalities > Patient has had progressive left renal cyst.  Most likely simple cyst with debris. > However, radiology recommending consideration of follow-up CT with and without contrast in the future to exclude hypoenhancing neoplasm.  Chronic pancreatitis > Known history of this.  Not on enzyme supplementation.  Was redemonstrated on CT and could be contributing to his pain as well. - Continue with  gentle fluids and bowel rest as above - Supportive care  Hemoccult positive > FOBT was positive in the ED.  Patient reports some bright red blood per rectum.  No external hemorrhoids noted could be internal hemorrhoids.  No significant change in hemoglobin. - Trend CBC - Will continue with aspirin and Plavix, but no chemo VTE prophylaxis  Chronic combined systolic and diastolic CHF > Echo in January showed EF 40-45%, G1 DD.  This was around the time of NSTEMI so function may have improved. - Not currently on diuretics - Continue home metoprolol, Isordil  CAD > NSTEMI in January status post stent placement with resolution of obstructive lesion. - Continue home aspirin, Plavix - Continue metoprolol, Isordil - Continue home Zetia  Hypertension - Continue home clonidine, hydralazine, metoprolol, Isordil  Hyperlipidemia - Continue home Zetia   Atrial flutter History of second-degree AV block type II Sinus node dysfunction > Status post pacemaker.  Currently paced rhythm in the ED. - Continue to monitor  GERD PUD - Continue home PPI and as needed Carafate  CKD 3B > Creatinine stable in the ED - Trend renal function and electrolytes  Schizophrenia > Stable for years. - Continue home bupropion and Invega  Asthma-COPD overlap syndrome - Continue home Singulair, Breztri, as needed albuterol  DVT prophylaxis: SCDs Code Status:   Full Family Communication:  Updated at bedside  Disposition Plan:   Patient is from:  Home  Anticipated DC to:  Home  Anticipated DC date:  1 to 4 days  Anticipated DC barriers: None  Consults called:  General surgery consulted in the ED, will see  Admission status:  Observation, telemetry  Severity of Illness: The appropriate patient status for this patient is OBSERVATION. Observation status is judged to be reasonable and necessary in order to provide the required intensity of service to ensure the patient's safety. The patient's presenting  symptoms, physical exam findings, and initial radiographic and laboratory data in the context of their medical condition is felt to place them at decreased risk for further clinical deterioration. Furthermore, it is anticipated that the patient will be medically stable for discharge from the hospital within 2 midnights of admission.    Synetta Fail MD [facility]  How to contact the [facility] Attending or Consulting provider 7A - 7P or covering provider during after hours 7P -7A, for this patient?   Check the care team in [facility] and look for a) attending/consulting [facility] provider listed and b) the [facility] team listed Log into [email or web address] and use [place]'s universal password to access. If you do not have the password, please contact the hospital operator. Locate the [facility] provider you are looking for under [facility] and page to a number that you can be directly reached. If you still have difficulty reaching the provider, please page the [facility] (Director on Call) for the Hospitalists listed on amion for assistance.  10/01/2022, 12:38 PM

## 2022-10-01 NOTE — ED Triage Notes (Signed)
Patient has multiple complaints , c/o abd. Pain onset 3-4 days ago  states he has an abd. Aneurysm was suppose to have ct of his abd. Last week however didn't have to up front money to do so. States his has  a cyst on his kidney and is on the renal transplant list . C/o n/v/d. States the abd. Pain comes and goes.

## 2022-10-02 ENCOUNTER — Observation Stay (HOSPITAL_COMMUNITY): Payer: 59

## 2022-10-02 DIAGNOSIS — E1122 Type 2 diabetes mellitus with diabetic chronic kidney disease: Secondary | ICD-10-CM | POA: Diagnosis present

## 2022-10-02 DIAGNOSIS — J4489 Other specified chronic obstructive pulmonary disease: Secondary | ICD-10-CM | POA: Diagnosis present

## 2022-10-02 DIAGNOSIS — I712 Thoracic aortic aneurysm, without rupture, unspecified: Secondary | ICD-10-CM | POA: Diagnosis present

## 2022-10-02 DIAGNOSIS — K810 Acute cholecystitis: Secondary | ICD-10-CM

## 2022-10-02 DIAGNOSIS — F32A Depression, unspecified: Secondary | ICD-10-CM | POA: Diagnosis present

## 2022-10-02 DIAGNOSIS — I495 Sick sinus syndrome: Secondary | ICD-10-CM

## 2022-10-02 DIAGNOSIS — I2510 Atherosclerotic heart disease of native coronary artery without angina pectoris: Secondary | ICD-10-CM

## 2022-10-02 DIAGNOSIS — F209 Schizophrenia, unspecified: Secondary | ICD-10-CM | POA: Diagnosis present

## 2022-10-02 DIAGNOSIS — I4892 Unspecified atrial flutter: Secondary | ICD-10-CM

## 2022-10-02 DIAGNOSIS — I441 Atrioventricular block, second degree: Secondary | ICD-10-CM | POA: Diagnosis present

## 2022-10-02 DIAGNOSIS — N1832 Chronic kidney disease, stage 3b: Secondary | ICD-10-CM | POA: Diagnosis present

## 2022-10-02 DIAGNOSIS — K861 Other chronic pancreatitis: Secondary | ICD-10-CM | POA: Diagnosis present

## 2022-10-02 DIAGNOSIS — I5042 Chronic combined systolic (congestive) and diastolic (congestive) heart failure: Secondary | ICD-10-CM | POA: Diagnosis present

## 2022-10-02 DIAGNOSIS — E1140 Type 2 diabetes mellitus with diabetic neuropathy, unspecified: Secondary | ICD-10-CM | POA: Diagnosis present

## 2022-10-02 DIAGNOSIS — Z8616 Personal history of COVID-19: Secondary | ICD-10-CM | POA: Diagnosis not present

## 2022-10-02 DIAGNOSIS — I130 Hypertensive heart and chronic kidney disease with heart failure and stage 1 through stage 4 chronic kidney disease, or unspecified chronic kidney disease: Secondary | ICD-10-CM | POA: Diagnosis present

## 2022-10-02 DIAGNOSIS — E871 Hypo-osmolality and hyponatremia: Secondary | ICD-10-CM | POA: Diagnosis present

## 2022-10-02 DIAGNOSIS — Z87891 Personal history of nicotine dependence: Secondary | ICD-10-CM | POA: Diagnosis not present

## 2022-10-02 DIAGNOSIS — I10 Essential (primary) hypertension: Secondary | ICD-10-CM | POA: Diagnosis not present

## 2022-10-02 DIAGNOSIS — J4520 Mild intermittent asthma, uncomplicated: Secondary | ICD-10-CM | POA: Diagnosis present

## 2022-10-02 DIAGNOSIS — Z1152 Encounter for screening for COVID-19: Secondary | ICD-10-CM | POA: Diagnosis not present

## 2022-10-02 DIAGNOSIS — I255 Ischemic cardiomyopathy: Secondary | ICD-10-CM | POA: Diagnosis present

## 2022-10-02 DIAGNOSIS — K8000 Calculus of gallbladder with acute cholecystitis without obstruction: Secondary | ICD-10-CM | POA: Diagnosis present

## 2022-10-02 DIAGNOSIS — J439 Emphysema, unspecified: Secondary | ICD-10-CM | POA: Diagnosis present

## 2022-10-02 DIAGNOSIS — I480 Paroxysmal atrial fibrillation: Secondary | ICD-10-CM | POA: Diagnosis present

## 2022-10-02 DIAGNOSIS — E782 Mixed hyperlipidemia: Secondary | ICD-10-CM | POA: Diagnosis present

## 2022-10-02 DIAGNOSIS — Z79899 Other long term (current) drug therapy: Secondary | ICD-10-CM | POA: Diagnosis not present

## 2022-10-02 LAB — CBC
HCT: 39.9 % (ref 39.0–52.0)
Hemoglobin: 12.9 g/dL — ABNORMAL LOW (ref 13.0–17.0)
MCH: 28.6 pg (ref 26.0–34.0)
MCHC: 32.3 g/dL (ref 30.0–36.0)
MCV: 88.5 fL (ref 80.0–100.0)
Platelets: 186 10*3/uL (ref 150–400)
RBC: 4.51 MIL/uL (ref 4.22–5.81)
RDW: 12.5 % (ref 11.5–15.5)
WBC: 8.3 10*3/uL (ref 4.0–10.5)
nRBC: 0 % (ref 0.0–0.2)

## 2022-10-02 LAB — COMPREHENSIVE METABOLIC PANEL
ALT: 15 U/L (ref 0–44)
AST: 20 U/L (ref 15–41)
Albumin: 3.3 g/dL — ABNORMAL LOW (ref 3.5–5.0)
Alkaline Phosphatase: 64 U/L (ref 38–126)
Anion gap: 7 (ref 5–15)
BUN: 21 mg/dL (ref 8–23)
CO2: 19 mmol/L — ABNORMAL LOW (ref 22–32)
Calcium: 8.1 mg/dL — ABNORMAL LOW (ref 8.9–10.3)
Chloride: 107 mmol/L (ref 98–111)
Creatinine, Ser: 1.77 mg/dL — ABNORMAL HIGH (ref 0.61–1.24)
GFR, Estimated: 43 mL/min — ABNORMAL LOW (ref >60)
Glucose, Bld: 103 mg/dL — ABNORMAL HIGH (ref 70–99)
Potassium: 5.1 mmol/L (ref 3.5–5.1)
Sodium: 133 mmol/L — ABNORMAL LOW (ref 135–145)
Total Bilirubin: 0.6 mg/dL (ref 0.3–1.2)
Total Protein: 6.1 g/dL — ABNORMAL LOW (ref 6.5–8.1)

## 2022-10-02 LAB — ECHOCARDIOGRAM COMPLETE
AR max vel: 2.25 cm2
AV Area VTI: 2.46 cm2
AV Area mean vel: 2.17 cm2
AV Mean grad: 2 mmHg
AV Peak grad: 3.7 mmHg
Ao pk vel: 0.96 m/s
Area-P 1/2: 2.68 cm2
Height: 73 in
S' Lateral: 2.6 cm
Weight: 2971.2 oz

## 2022-10-02 MED ORDER — SODIUM CHLORIDE 0.9 % IV SOLN
3.0000 g | Freq: Three times a day (TID) | INTRAVENOUS | Status: DC
  Administered 2022-10-02 – 2022-10-05 (×9): 3 g via INTRAVENOUS
  Filled 2022-10-02 (×9): qty 8

## 2022-10-02 NOTE — Plan of Care (Signed)

## 2022-10-02 NOTE — Progress Notes (Signed)
Subjective/Chief Complaint: No complaints. Feels better   Objective: Vital signs in last 24 hours: Temp:  [97.8 F (36.6 C)-98.1 F (36.7 C)] 97.8 F (36.6 C) (05/27 0803) Pulse Rate:  [56-72] 61 (05/27 0903) Resp:  [13-19] 16 (05/27 0448) BP: (131-201)/(85-120) 131/93 (05/27 0803) SpO2:  [94 %-100 %] 98 % (05/27 0812) Weight:  [84.2 kg] 84.2 kg (05/27 0550) Last BM Date : 09/27/22  Intake/Output from previous day: 05/26 0701 - 05/27 0700 In: -  Out: 900 [Urine:900] Intake/Output this shift: No intake/output data recorded.  General appearance: alert and cooperative Resp: clear to auscultation bilaterally Cardio: regular rate and rhythm GI: soft, nontender  Lab Results:  Recent Labs    10/01/22 0420 10/02/22 0243  WBC 8.2 8.3  HGB 13.4 12.9*  HCT 42.5 39.9  PLT 210 186   BMET Recent Labs    10/01/22 0420 10/02/22 0243  NA 133* 133*  K 4.6 5.1  CL 100 107  CO2 25 19*  GLUCOSE 104* 103*  BUN 25* 21  CREATININE 1.94* 1.77*  CALCIUM 8.7* 8.1*   PT/INR No results for input(s): "LABPROT", "INR" in the last 72 hours. ABG No results for input(s): "PHART", "HCO3" in the last 72 hours.  Invalid input(s): "PCO2", "PO2"  Studies/Results: US Abdomen Limited RUQ (LIVER/GB)  Result Date: 10/01/2022 CLINICAL DATA:  Gallstones. EXAM: ULTRASOUND ABDOMEN LIMITED RIGHT UPPER QUADRANT COMPARISON:  CT from earlier today FINDINGS: Gallbladder: 5 mm stone identified within the gallbladder. The gallbladder appears partially decompressed with wall thickening measuring up to 6.6 mm. Positive sonographic Murphy's sign was reported by the sonographer. Common bile duct: Diameter: 3.1 Liver: No focal lesion identified. Within normal limits in parenchymal echogenicity. Portal vein is patent on color Doppler imaging with normal direction of blood flow towards the liver. Other: Diminished exam detail due to overlying bowel gas. IMPRESSION: Gallstone with gallbladder wall thickening  and positive sonographic Murphy's sign. Cannot exclude acute cholecystitis. Electronically Signed   By: Signa Kell M.D.   On: 10/01/2022 10:18   CT Angio Chest/Abd/Pel for Dissection W and/or W/WO  Result Date: 10/01/2022 CLINICAL DATA:  Acute aortic syndrome. EXAM: CT ANGIOGRAPHY CHEST, ABDOMEN AND PELVIS TECHNIQUE: Non-contrast CT of the chest was initially obtained. Multidetector CT imaging through the chest, abdomen and pelvis was performed using the standard protocol during bolus administration of intravenous contrast. Multiplanar reconstructed images and MIPs were obtained and reviewed to evaluate the vascular anatomy. RADIATION DOSE REDUCTION: This exam was performed according to the departmental dose-optimization program which includes automated exposure control, adjustment of the mA and/or kV according to patient size and/or use of iterative reconstruction technique. CONTRAST:  OMNIPAQUE IOHEXOL 350 MG/ML SOLN COMPARISON:  Abdomen pelvis CT 10/21/2020 FINDINGS: CTA CHEST FINDINGS Cardiovascular: The heart size is normal. No substantial pericardial effusion. Coronary artery calcification is evident. Coronary stent device evident. Mild atherosclerotic calcification is noted in the wall of the thoracic aorta. Left-sided permanent pacemaker evident. Precontrast imaging shows no hyperdense crescent in the wall of the thoracic aorta to suggest the presence of an acute intramural hematoma. No thoracic aortic aneurysm. Imaging after IV contrast administration shows no dissection of the thoracic aorta. Sagittal imaging reveals a penetrating ulcer or ulcerated plaque along the under surface of the transverse aorta measuring on the order of 11 x 8 mm. This is also seen on axial image 43/6. Mediastinum/Nodes: No mediastinal lymphadenopathy. No evidence for gross hilar lymphadenopathy although assessment is limited by the lack of intravenous contrast on the  current study. The esophagus has normal imaging  features. There is no axillary lymphadenopathy. Lungs/Pleura: Centrilobular and paraseptal emphysema evident. No suspicious pulmonary nodule or mass. No focal airspace consolidation. No pleural effusion. Musculoskeletal: No worrisome lytic or sclerotic osseous abnormality. Review of the MIP images confirms the above findings. CTA ABDOMEN AND PELVIS FINDINGS VASCULAR Aorta: Infrarenal abdominal aorta measures 3.9 x 3.7 cm in maximum orthogonal diameter. Atherosclerotic changes evident with prominent mural thrombus in the aneurysmal segment. No dissection, vasculitis or significant stenosis. Celiac: Patent without evidence of aneurysm, dissection, vasculitis or significant stenosis. SMA: Patent without evidence of aneurysm, dissection, vasculitis or significant stenosis. Renals: Both renal arteries are patent without evidence of aneurysm, dissection, vasculitis, fibromuscular dysplasia or significant stenosis. IMA: Chronic occlusion Inflow: Patent without evidence of aneurysm, dissection, vasculitis or significant stenosis. Veins: No obvious venous abnormality within the limitations of this arterial phase study. Review of the MIP images confirms the above findings. NON-VASCULAR Hepatobiliary: No suspicious focal abnormality within the liver parenchyma. Gallbladder is decompressed which accentuates wall thickness gallbladder wall is ill-defined raising the question of pericholecystic edema. 6 mm gallstone evident. No intrahepatic or extrahepatic biliary dilation. Pancreas: Diffuse parenchymal calcification consistent with chronic pancreatitis. No main duct dilatation. Spleen: No splenomegaly. No focal mass lesion. Adrenals/Urinary Tract: No adrenal nodule or mass. Large central sinus cysts noted left kidney. There is dilatation of an upper pole calyx, similar to CT scan from 09/25/2014, potentially due to mass-effect from the large central sinus cyst. Overlying cortical thinning right kidney with levin mm stone upper  interpolar region and 10 mm stone in the lower interpolar region. No associated right hydroureter. Component of right UPJ obstruction cannot be excluded. 5.5 cm exophytic low-density lesion upper pole left kidney has increased from 4.7 cm on the 2022 exam. 15 mm subcapsular lesion upper pole left kidney is similar to prior. Both of these lesions have attenuation slightly higher than would be expected for simple cyst. 8 mm nonobstructing stone identified lower pole left kidney. Left ureter unremarkable. The urinary bladder appears normal for the degree of distention. Stomach/Bowel: Stomach is unremarkable. No gastric wall thickening. No evidence of outlet obstruction. Duodenum is normally positioned as is the ligament of Treitz. No small bowel wall thickening. No small bowel dilatation. The terminal ileum is normal. The appendix is not well visualized, but there is no edema or inflammation in the region of the cecum. No gross colonic mass. No colonic wall thickening. Lymphatic: There is no gastrohepatic or hepatoduodenal ligament lymphadenopathy. No retroperitoneal or mesenteric lymphadenopathy. No pelvic sidewall lymphadenopathy. Reproductive: The prostate gland and seminal vesicles are unremarkable. Other: No intraperitoneal free fluid. Musculoskeletal: No worrisome lytic or sclerotic osseous abnormality. Review of the MIP images confirms the above findings. IMPRESSION: 1. No evidence for acute intramural hematoma or dissection in the thoracoabdominal aorta. 2. 11 x 8 mm penetrating ulcer or ulcerated plaque along the under surface of the transverse aorta. 3. 3.9 x 3.7 cm infrarenal abdominal aortic aneurysm. 4. Gallbladder is decompressed which accentuates wall thickness. Gallbladder wall is ill-defined raising the question of pericholecystic edema. There is a 6 mm gallstone evident. Right upper quadrant ultrasound may prove helpful to further evaluate. 5. Large central sinus cyst left kidney with dilatation of an  upper pole calyx, similar to CT scan from 09/25/2014, potentially due to mass-effect from the large central sinus cyst. Component of right UPJ obstruction cannot be excluded. 6. Bilateral nonobstructing nephrolithiasis. 7. 5.5 cm exophytic low-density lesion upper pole left kidney has increased  from 4.7 cm on the 2022 exam. Another smaller upper pole left renal lesion is also mildly progressed. Both of these lesions have attenuation slightly higher than would be expected for simple cysts. While likely cysts complicated by proteinaceous debris or hemorrhage. Given the presence of permanent pacemaker, follow-up CT abdomen with and without contrast may be warranted to exclude hypoenhancing neoplasm. 8. Chronic pancreatitis. 9.  Emphysema (ICD10-J43.9). Electronically Signed   By: Kennith Center M.D.   On: 10/01/2022 08:51   US Aorta  Result Date: 10/01/2022 CLINICAL DATA:  Abdominal pain. History of abdominal aortic aneurysm. EXAM: ULTRASOUND OF ABDOMINAL AORTA TECHNIQUE: Ultrasound examination of the abdominal aorta and proximal common iliac arteries was performed to evaluate for aneurysm. Additional color and Doppler images of the distal aorta were obtained to document patency. COMPARISON:  CT abdomen/pelvis 10/21/2020 FINDINGS: Abdominal aortic measurements as follows: Proximal:  2.7 x 2.9 cm Mid:  2.1 x 2.0 cm Distal:  3.5 x 3.9 cm Patent: Yes, peak systolic velocity is 63.6 cm/s Right common iliac artery: 1.0 x 1.1 cm Left common iliac artery: 0.7 x 1.1 cm IMPRESSION: 3.9 cm abdominal aortic aneurysm. Infrarenal abdominal aorta was 3.4 cm maximum diameter when remeasured on the study from 10/21/2020 compatible with mild interval progression. Ultrasound can be insensitive for acute aortic aneurysm leak and dissection. Recommend follow-up ultrasound every 2 years. This recommendation follows ACR consensus guidelines: White Paper of the ACR Incidental Findings Committee II on Vascular Findings. J Am Coll Radiol  2013; 10:789-794. Electronically Signed   By: Kennith Center M.D.   On: 10/01/2022 07:30    Anti-infectives: Anti-infectives (From admission, onward)    None       Assessment/Plan: s/p * No surgery found * cholecystitis High risk for surgery per Cards. Can not come of plavix right now Not amenable to perc drain Luckily he is responding well to abx. Will continue abx for now and monitor  LOS: 0 days    Peter Marsh 10/02/2022

## 2022-10-02 NOTE — Progress Notes (Addendum)
Cardiology Progress Note  Patient ID: Peter Marsh MRN: [id number] DOB: November 21, 1960 Date of Encounter: 10/02/2022  Primary Cardiologist: Reatha Harps, MD  Subjective   Chief Complaint: Abdominal pain  HPI: Reports abdominal pain is better.  Likely plan for cholecystostomy drain.   ROS:  All other ROS reviewed and negative. Pertinent positives noted in the HPI.     Inpatient Medications  Scheduled Meds:  aspirin  81 mg Oral Daily   buPROPion  150 mg Oral BID   cloNIDine  0.2 mg Oral TID   clopidogrel  75 mg Oral Daily   ezetimibe  10 mg Oral Daily   fluticasone furoate-vilanterol  1 puff Inhalation Daily   hydrALAZINE  100 mg Oral TID   isosorbide dinitrate  30 mg Oral TID   metoprolol succinate  100 mg Oral BID   montelukast  10 mg Oral QHS   paliperidone  6 mg Oral QHS   pantoprazole  40 mg Oral BID   sodium chloride flush  3 mL Intravenous Q12H   umeclidinium bromide  1 puff Inhalation Daily   Continuous Infusions:  PRN Meds: acetaminophen **OR** acetaminophen, albuterol, alum & mag hydroxide-simeth, labetalol, ondansetron (ZOFRAN) IV, polyethylene glycol, sucralfate   Vital Signs   Vitals:   10/02/22 0448 10/02/22 0550 10/02/22 0803 10/02/22 0812  BP: 139/86  (!) 131/93   Pulse: 60  60   Resp: 16     Temp: 97.9 F (36.6 C)  97.8 F (36.6 C)   TempSrc: Oral  Oral   SpO2: 99%  99% 98%  Weight:  84.2 kg    Height:        Intake/Output Summary (Last 24 hours) at 10/02/2022 0831 Last data filed at 10/02/2022 0700 Gross per 24 hour  Intake --  Output 900 ml  Net -900 ml      10/02/2022    5:50 AM 10/01/2022    4:13 AM 09/05/2022    8:53 AM  Last 3 Weights  Weight (lbs) 185 lb 11.2 oz 185 lb 194 lb  Weight (kg) 84.233 kg 83.915 kg 87.998 kg      Telemetry  Overnight telemetry shows V paced 60, which I personally reviewed.   Physical Exam   Vitals:   10/02/22 0448 10/02/22 0550 10/02/22 0803 10/02/22 0812  BP: 139/86  (!) 131/93   Pulse: 60   60   Resp: 16     Temp: 97.9 F (36.6 C)  97.8 F (36.6 C)   TempSrc: Oral  Oral   SpO2: 99%  99% 98%  Weight:  84.2 kg    Height:        Intake/Output Summary (Last 24 hours) at 10/02/2022 0831 Last data filed at 10/02/2022 0700 Gross per 24 hour  Intake --  Output 900 ml  Net -900 ml       10/02/2022    5:50 AM 10/01/2022    4:13 AM 09/05/2022    8:53 AM  Last 3 Weights  Weight (lbs) 185 lb 11.2 oz 185 lb 194 lb  Weight (kg) 84.233 kg 83.915 kg 87.998 kg    Body mass index is 24.5 kg/m.  General: Well nourished, well developed, in no acute distress Head: Atraumatic, normal size  Eyes: PEERLA, EOMI  Neck: Supple, no JVD Endocrine: No thryomegaly Cardiac: Normal S1, S2; RRR; no murmurs, rubs, or gallops Lungs: Clear to auscultation bilaterally, no wheezing, rhonchi or rales  Abd: Soft, nontender, no hepatomegaly  Ext: No edema, pulses 2+ Musculoskeletal:  No deformities, BUE and BLE strength normal and equal Skin: Warm and dry, no rashes   Neuro: Alert and oriented to person, place, time, and situation, CNII-XII grossly intact, no focal deficits  Psych: Normal mood and affect   Labs  High Sensitivity Troponin:  No results for input(s): "TROPONINIHS" in the last 720 hours.   Cardiac EnzymesNo results for input(s): "TROPONINI" in the last 168 hours. No results for input(s): "TROPIPOC" in the last 168 hours.  Chemistry Recent Labs  Lab 10/01/22 0420 10/02/22 0243  NA 133* 133*  K 4.6 5.1  CL 100 107  CO2 25 19*  GLUCOSE 104* 103*  BUN 25* 21  CREATININE 1.94* 1.77*  CALCIUM 8.7* 8.1*  PROT 6.9 6.1*  ALBUMIN 3.7 3.3*  AST 23 20  ALT 17 15  ALKPHOS 66 64  BILITOT 0.8 0.6  GFRNONAA 38* 43*  ANIONGAP 8 7    Hematology Recent Labs  Lab 10/01/22 0420 10/02/22 0243  WBC 8.2 8.3  RBC 4.78 4.51  HGB 13.4 12.9*  HCT 42.5 39.9  MCV 88.9 88.5  MCH 28.0 28.6  MCHC 31.5 32.3  RDW 12.5 12.5  PLT 210 186   BNPNo results for input(s): "BNP", "PROBNP" in the last  168 hours.  DDimer No results for input(s): "DDIMER" in the last 168 hours.   Radiology  US Abdomen Limited RUQ (LIVER/GB)  Result Date: 10/01/2022 CLINICAL DATA:  Gallstones. EXAM: ULTRASOUND ABDOMEN LIMITED RIGHT UPPER QUADRANT COMPARISON:  CT from earlier today FINDINGS: Gallbladder: 5 mm stone identified within the gallbladder. The gallbladder appears partially decompressed with wall thickening measuring up to 6.6 mm. Positive sonographic Murphy's sign was reported by the sonographer. Common bile duct: Diameter: 3.1 Liver: No focal lesion identified. Within normal limits in parenchymal echogenicity. Portal vein is patent on color Doppler imaging with normal direction of blood flow towards the liver. Other: Diminished exam detail due to overlying bowel gas. IMPRESSION: Gallstone with gallbladder wall thickening and positive sonographic Murphy's sign. Cannot exclude acute cholecystitis. Electronically Signed   By: Signa Kell M.D.   On: 10/01/2022 10:18   CT Angio Chest/Abd/Pel for Dissection W and/or W/WO  Result Date: 10/01/2022 CLINICAL DATA:  Acute aortic syndrome. EXAM: CT ANGIOGRAPHY CHEST, ABDOMEN AND PELVIS TECHNIQUE: Non-contrast CT of the chest was initially obtained. Multidetector CT imaging through the chest, abdomen and pelvis was performed using the standard protocol during bolus administration of intravenous contrast. Multiplanar reconstructed images and MIPs were obtained and reviewed to evaluate the vascular anatomy. RADIATION DOSE REDUCTION: This exam was performed according to the departmental dose-optimization program which includes automated exposure control, adjustment of the mA and/or kV according to patient size and/or use of iterative reconstruction technique. CONTRAST:  OMNIPAQUE IOHEXOL 350 MG/ML SOLN COMPARISON:  Abdomen pelvis CT 10/21/2020 FINDINGS: CTA CHEST FINDINGS Cardiovascular: The heart size is normal. No substantial pericardial effusion. Coronary artery  calcification is evident. Coronary stent device evident. Mild atherosclerotic calcification is noted in the wall of the thoracic aorta. Left-sided permanent pacemaker evident. Precontrast imaging shows no hyperdense crescent in the wall of the thoracic aorta to suggest the presence of an acute intramural hematoma. No thoracic aortic aneurysm. Imaging after IV contrast administration shows no dissection of the thoracic aorta. Sagittal imaging reveals a penetrating ulcer or ulcerated plaque along the under surface of the transverse aorta measuring on the order of 11 x 8 mm. This is also seen on axial image 43/6. Mediastinum/Nodes: No mediastinal lymphadenopathy. No evidence for gross hilar  lymphadenopathy although assessment is limited by the lack of intravenous contrast on the current study. The esophagus has normal imaging features. There is no axillary lymphadenopathy. Lungs/Pleura: Centrilobular and paraseptal emphysema evident. No suspicious pulmonary nodule or mass. No focal airspace consolidation. No pleural effusion. Musculoskeletal: No worrisome lytic or sclerotic osseous abnormality. Review of the MIP images confirms the above findings. CTA ABDOMEN AND PELVIS FINDINGS VASCULAR Aorta: Infrarenal abdominal aorta measures 3.9 x 3.7 cm in maximum orthogonal diameter. Atherosclerotic changes evident with prominent mural thrombus in the aneurysmal segment. No dissection, vasculitis or significant stenosis. Celiac: Patent without evidence of aneurysm, dissection, vasculitis or significant stenosis. SMA: Patent without evidence of aneurysm, dissection, vasculitis or significant stenosis. Renals: Both renal arteries are patent without evidence of aneurysm, dissection, vasculitis, fibromuscular dysplasia or significant stenosis. IMA: Chronic occlusion Inflow: Patent without evidence of aneurysm, dissection, vasculitis or significant stenosis. Veins: No obvious venous abnormality within the limitations of this arterial  phase study. Review of the MIP images confirms the above findings. NON-VASCULAR Hepatobiliary: No suspicious focal abnormality within the liver parenchyma. Gallbladder is decompressed which accentuates wall thickness gallbladder wall is ill-defined raising the question of pericholecystic edema. 6 mm gallstone evident. No intrahepatic or extrahepatic biliary dilation. Pancreas: Diffuse parenchymal calcification consistent with chronic pancreatitis. No main duct dilatation. Spleen: No splenomegaly. No focal mass lesion. Adrenals/Urinary Tract: No adrenal nodule or mass. Large central sinus cysts noted left kidney. There is dilatation of an upper pole calyx, similar to CT scan from 09/25/2014, potentially due to mass-effect from the large central sinus cyst. Overlying cortical thinning right kidney with levin mm stone upper interpolar region and 10 mm stone in the lower interpolar region. No associated right hydroureter. Component of right UPJ obstruction cannot be excluded. 5.5 cm exophytic low-density lesion upper pole left kidney has increased from 4.7 cm on the 2022 exam. 15 mm subcapsular lesion upper pole left kidney is similar to prior. Both of these lesions have attenuation slightly higher than would be expected for simple cyst. 8 mm nonobstructing stone identified lower pole left kidney. Left ureter unremarkable. The urinary bladder appears normal for the degree of distention. Stomach/Bowel: Stomach is unremarkable. No gastric wall thickening. No evidence of outlet obstruction. Duodenum is normally positioned as is the ligament of Treitz. No small bowel wall thickening. No small bowel dilatation. The terminal ileum is normal. The appendix is not well visualized, but there is no edema or inflammation in the region of the cecum. No gross colonic mass. No colonic wall thickening. Lymphatic: There is no gastrohepatic or hepatoduodenal ligament lymphadenopathy. No retroperitoneal or mesenteric lymphadenopathy. No  pelvic sidewall lymphadenopathy. Reproductive: The prostate gland and seminal vesicles are unremarkable. Other: No intraperitoneal free fluid. Musculoskeletal: No worrisome lytic or sclerotic osseous abnormality. Review of the MIP images confirms the above findings. IMPRESSION: 1. No evidence for acute intramural hematoma or dissection in the thoracoabdominal aorta. 2. 11 x 8 mm penetrating ulcer or ulcerated plaque along the under surface of the transverse aorta. 3. 3.9 x 3.7 cm infrarenal abdominal aortic aneurysm. 4. Gallbladder is decompressed which accentuates wall thickness. Gallbladder wall is ill-defined raising the question of pericholecystic edema. There is a 6 mm gallstone evident. Right upper quadrant ultrasound may prove helpful to further evaluate. 5. Large central sinus cyst left kidney with dilatation of an upper pole calyx, similar to CT scan from 09/25/2014, potentially due to mass-effect from the large central sinus cyst. Component of right UPJ obstruction cannot be excluded. 6. Bilateral nonobstructing nephrolithiasis.  7. 5.5 cm exophytic low-density lesion upper pole left kidney has increased from 4.7 cm on the 2022 exam. Another smaller upper pole left renal lesion is also mildly progressed. Both of these lesions have attenuation slightly higher than would be expected for simple cysts. While likely cysts complicated by proteinaceous debris or hemorrhage. Given the presence of permanent pacemaker, follow-up CT abdomen with and without contrast may be warranted to exclude hypoenhancing neoplasm. 8. Chronic pancreatitis. 9.  Emphysema (ICD10-J43.9). Electronically Signed   By: Kennith Center M.D.   On: 10/01/2022 08:51   US Aorta  Result Date: 10/01/2022 CLINICAL DATA:  Abdominal pain. History of abdominal aortic aneurysm. EXAM: ULTRASOUND OF ABDOMINAL AORTA TECHNIQUE: Ultrasound examination of the abdominal aorta and proximal common iliac arteries was performed to evaluate for aneurysm.  Additional color and Doppler images of the distal aorta were obtained to document patency. COMPARISON:  CT abdomen/pelvis 10/21/2020 FINDINGS: Abdominal aortic measurements as follows: Proximal:  2.7 x 2.9 cm Mid:  2.1 x 2.0 cm Distal:  3.5 x 3.9 cm Patent: Yes, peak systolic velocity is 63.6 cm/s Right common iliac artery: 1.0 x 1.1 cm Left common iliac artery: 0.7 x 1.1 cm IMPRESSION: 3.9 cm abdominal aortic aneurysm. Infrarenal abdominal aorta was 3.4 cm maximum diameter when remeasured on the study from 10/21/2020 compatible with mild interval progression. Ultrasound can be insensitive for acute aortic aneurysm leak and dissection. Recommend follow-up ultrasound every 2 years. This recommendation follows ACR consensus guidelines: White Paper of the ACR Incidental Findings Committee II on Vascular Findings. J Am Coll Radiol 2013; 10:789-794. Electronically Signed   By: Kennith Center M.D.   On: 10/01/2022 07:30    Cardiac Studies  TTE 05/26/2022  1. Left ventricular ejection fraction, by estimation, is 40 to 45%. The  left ventricle has mildly decreased function. The left ventricle  demonstrates regional wall motion abnormalities. Abnormal (paradoxical)  septal motion, consistent with left bundle  branch block. Apical hypokinesis.      There is severe asymmetric left ventricular hypertrophy of the  basal-septal segment. Left ventricular diastolic parameters are consistent  with Grade I diastolic dysfunction (impaired relaxation).   2. Right ventricular systolic function is normal. The right ventricular  size is normal.   3. The mitral valve is normal in structure. Trivial mitral valve  regurgitation. No evidence of mitral stenosis.   4. The aortic valve is tricuspid. Aortic valve regurgitation is not  visualized. No aortic stenosis is present.   LHC 05/26/2022    Prox RCA lesion is 25% stenosed.   Prox LAD lesion is 99% stenosed.  A drug-eluting stent was successfully placed using a SYNERGY XD  [id number] postdilated to greater than 4 m in diameter and optimized with intravascular ultrasound..   Post intervention, there is a 0% residual stenosis.   LV end diastolic pressure is normal.   There is no aortic valve stenosis.   Successful PCI of the severe lesion in the proximal LAD.  Continue dual antiplatelet therapy for at least 12 months.  Continue aggressive secondary prevention.  Will decrease his dose of isosorbide from 4 times a day down to 3 times a day.  This can gradually be weaned off based on his symptoms.  Watch overnight.  Hydrate aggressively due to renal insufficiency.  Specific emphasis was placed on minimizing contrast.  Only 50 cc of contrast was used for the procedure.  Plan for discharge tomorrow.  Patient Profile  Peter Marsh is a 62 y.o. male with CAD,  systolic heart failure, CKD stage IV, AAA, hypertension, sick sinus syndrome status post pacemaker implant, atrial flutter (not on anticoagulation due to prior GI bleed), schizophrenia admitted on 10/01/2022 for acute cholecystitis.  Cardiology consulted for recommendations regarding DAPT and his need for procedures.  Assessment & Plan   # Acute cholecystitis # Recent non-STEMI with PCI to proximal LAD -He is within 4 months of PCI of the proximal LAD in the setting of an acute coronary syndrome.  Evaluated by cardiology yesterday.  -He would be high risk to come off Plavix in the setting of proximal LAD PCI in the setting of an acute coronary syndrome < 6 months, however if needed this could be done. -If possible cardiology would recommend percutaneous options to treat his cholecystitis.  This seems to be an option per surgery.  He would also be able to continue on aspirin and Plavix. -Again, if only option is surgery, we would accept his risk and hold plavix.   # Chronic systolic heart failure, 40-45% # Ischemic cardiomyopathy -Euvolemic on examination. -Continue metoprolol succinate 100 mg twice daily.  Continue  hydralazine 100 mg 3 times daily, Isordil dinitrate 30 mg 3 times daily. -No ACE/ARB/ARNI/MRA given CKD stage IV. -Recheck echo while here.  Suspect LV dysfunction likely has resolved.  No signs of heart failure.    # AAA with penetrating aortic ulcer -med management per vascular. No surgery indicated.   # Paroxysmal Afib/flutter -no recurrence -no AC due to prior GI bleeding   # CKD IV -stable  # HTN -as above  # SSS s/p ppm -functioning normally.   For questions or updates, please contact [facility] Please consult [email or web address] for contact info under     Signed, Gerri Spore T. Flora Lipps, MD, [facility]  [facility]  10/02/2022 8:31 AM

## 2022-10-02 NOTE — Progress Notes (Signed)
Pharmacy Antibiotic Note  Peter Marsh is a 62 y.o. male admitted on 10/01/2022 with  cholecystitis .  Pharmacy has been consulted for Unasyn dosing.  Patient admitted with nausea and abdominal pain. CT and ultrasound of abdomen are indicative of cholecystitis. Patient remains afebrile without leukocytosis, has history of CKD - Scr today is 1.77 (BL ~2-2.2; CrCl 48 mL/min). General Surgery has been consulted for possible surgical intervention.   Plan: Start Unasyn 3 grams IV q8 hours Monitor clinical status, renal function, and culture data Follow-up possible surgical intervention and LOT  Height: 6\' 1"  (185.4 cm) Weight: 84.2 kg (185 lb 11.2 oz) IBW/kg (Calculated) : 79.9  Temp (24hrs), Avg:97.9 F (36.6 C), Min:97.8 F (36.6 C), Max:98.1 F (36.7 C)  Recent Labs  Lab 10/01/22 0420 10/02/22 0243  WBC 8.2 8.3  CREATININE 1.94* 1.77*    Estimated Creatinine Clearance: 48.3 mL/min (A) (by C-G formula based on SCr of 1.77 mg/dL (H)).    Allergies  Allergen Reactions   Methylpyrrolidone Hives    froze the intestine   Niacin Itching, Nausea And Vomiting and Other (See Comments)    Flushing, itching, tingling    Nsaids Other (See Comments)    Other Reaction(s): CKD 4   Ace Inhibitors     Other reaction(s): CKD 4   Norvasc [Amlodipine Besylate] Other (See Comments)    Swollen Feet   Other Other (See Comments)   Oxybutynin Chloride [Oxybutynin Chloride Er] Other (See Comments)    froze the intestine   Solifenacin Succinate Other (See Comments)   Statins     Reports severe reaction but cannot remember exactly what it was    Vesicare [Solifenacin Succinate] Other (See Comments)    Froze the intestine    Ciprofloxacin Rash and Other (See Comments)    Felt flushed    Oxybutynin Rash and Other (See Comments)    bowel obst   Solifenacin Rash   Antimicrobials this admission: Unasyn 5/27 >>  Dose adjustments this admission:   Microbiology results: N/A  Thank you for  involving pharmacy in this patient's care.   Rockwell Alexandria, PharmD PGY1 Pharmacy Resident 10/02/2022 10:08 AM

## 2022-10-02 NOTE — Consult Note (Addendum)
ASSESSMENT & PLAN   PENETRATING AORTIC ULCER: This patient has a penetrating aortic ulcer on the inferior aspect of the transverse thoracic aorta.  The width of the neck of the ulcer is 11 mm.  The ulcer is 8 mm in depth.  This is an incidental finding and is asymptomatic.  Is in a difficult location as it is fairly close to the proximity of the takeoff of the left subclavian artery.  I think he just needs close follow-up for now.  I will arrange for a follow-up CT angio in 4 to 6 weeks with an office visit at that time.  Given the this penetrating aortic ulcer he he does need to be sure that his blood pressure is under good control.  ABDOMINAL AORTIC ANEURYSM: This patient was seen in August 2022 by Dr. Randie Heinz with a small aneurysm.  At that time it was 3.2 cm in maximum diameter.  The aneurysm is now 3.9 cm in maximum diameter.  He will need a follow-up ultrasound of his aorta in 1 year.  I will arrange that visit.   REASON FOR CONSULT:    Penetrating aortic ulcer (incidental finding).  The consult is requested by Dr. Sheliah Hatch.  HPI:   Peter Marsh is a 62 y.o. male who was admitted yesterday with abdominal pain.  The patient does have a history of a small abdominal aortic aneurysm and therefore underwent a CT angiogram.  The CT angiogram shows no significant change in the size of his aneurysm.  An incidental finding was a penetrating aortic ulcer at the inferior aspect of the transverse thoracic aorta.  I reviewed the films and discussed the case with Dr. Sheliah Hatch.   The patient does have a complicated medical history.  He has a history of congestive heart failure, atrial flutter, sinus node dysfunction, coronary artery disease, COPD, schizophrenia, hepatitis C, chronic kidney disease, and hypertension.  On my history he denies any significant chest pain or chest pressure.  Past Medical History:  Diagnosis Date   Allergy    Anxiety    Asthma    uses inhalers    Bilateral carotid  bruits    Cardiac conduction disorder 2018   s/p MDT PPM   Cataract    Chest pain 06/24/2018   Chronic kidney disease    bladder interstial cystitis    Chronic kidney disease (CKD), stage IV (severe) ([name])    followed by Dr. Marisue Humble at [facility]   COPD (chronic obstructive pulmonary disease) ([name])    Coronary artery disease    COVID-19 virus infection 10/21/2020   Depression    Diabetes mellitus without complication ([name])    Discord with neighbors, lodgers and landlord 11/26/2021   Encounter for care of pacemaker 02/13/2019   GERD (gastroesophageal reflux disease)    Hematemesis 09/16/2016   History of stomach ulcers 2001   Hypertension    LBBB (left bundle branch block)    Lower extremity edema    Mild intermittent asthma without complication    Mixed hyperlipidemia    Mobitz type 2 second degree AV block 04/06/2019   Neuropathy    Pacemaker: Medtronic Azure XT DR MRI [id number] PPM -  BUNDLE OF HIS pacing  02/26/2017   Scheduled Remote pacemaker check  11/12/2018:  There were 24 Fast AV episodes:  EGMs show SVTs. Episodes lasted < 2 minutes. Health trends do not demonstrate significant abnormality. Battery longevity is 9.4 - 10.3 years. RA pacing is 47.1 %, RV pacing is 40.4 %.  Clinic check 11/07/17.    Paroxysmal atrial flutter ([name])    PONV (postoperative nausea and vomiting)    Prostatitis    Recurrent upper respiratory infection (URI)    Schizophrenia ([name])    Sinus node dysfunction ([name]) 02/13/2019   Urticaria     Family History  Problem Relation Age of Onset   High blood pressure Mother    Alzheimer's disease Father    Heart attack Brother    Colon cancer Neg Hx    Esophageal cancer Neg Hx    Rectal cancer Neg Hx    Stomach cancer Neg Hx     SOCIAL HISTORY: Social History   Tobacco Use   Smoking status: Former    Packs/day: 1.00    Years: 30.00    Additional pack years: 0.00    Total pack years: 30.00    Types: Cigarettes    Quit date: 05/08/2008     Years since quitting: 14.4   Smokeless tobacco: Never  Substance Use Topics   Alcohol use: No    Allergies  Allergen Reactions   Methylpyrrolidone Hives    froze the intestine   Niacin Itching, Nausea And Vomiting and Other (See Comments)    Flushing, itching, tingling    Ace Inhibitors     Other reaction(s): CKD 4   Norvasc [Amlodipine Besylate] Other (See Comments)    Swollen Feet   Other Other (See Comments)   Oxybutynin Chloride [Oxybutynin Chloride Er] Other (See Comments)    froze the intestine   Statins     Reports severe reaction but cannot remember exactly what it was    Vesicare [Solifenacin Succinate] Other (See Comments)    Froze the intestine    Ciprofloxacin Rash and Other (See Comments)    Felt flushed    Oxybutynin Rash and Other (See Comments)    bowel obst   Solifenacin Rash    Current Facility-Administered Medications  Medication Dose Route Frequency Provider Last Rate Last Admin   acetaminophen (TYLENOL) tablet 650 mg  650 mg Oral Q6H PRN Synetta Fail, MD       Or   acetaminophen (TYLENOL) suppository 650 mg  650 mg Rectal Q6H PRN Synetta Fail, MD       albuterol (PROVENTIL) (2.5 MG/3ML) 0.083% nebulizer solution 3 mL  3 mL Inhalation Q6H PRN Synetta Fail, MD       alum & mag hydroxide-simeth (MAALOX/MYLANTA) 200-200-20 MG/5ML suspension 30 mL  30 mL Oral Q4H PRN Synetta Fail, MD   30 mL at 10/01/22 2138   aspirin chewable tablet 81 mg  81 mg Oral Daily Synetta Fail, MD       buPROPion [facility] SR) 12 hr tablet 150 mg  150 mg Oral BID Synetta Fail, MD   150 mg at 10/01/22 2139   cloNIDine (CATAPRES) tablet 0.2 mg  0.2 mg Oral TID Synetta Fail, MD   0.2 mg at 10/01/22 2139   clopidogrel (PLAVIX) tablet 75 mg  75 mg Oral Daily Synetta Fail, MD       ezetimibe (ZETIA) tablet 10 mg  10 mg Oral Daily Synetta Fail, MD       fluticasone furoate-vilanterol (BREO ELLIPTA) 100-25 MCG/ACT 1 puff  1  puff Inhalation Daily Synetta Fail, MD       hydrALAZINE (APRESOLINE) tablet 100 mg  100 mg Oral TID Synetta Fail, MD   100 mg at 10/01/22 2138   isosorbide dinitrate (ISORDIL)  tablet 30 mg  30 mg Oral TID Synetta Fail, MD   30 mg at 10/01/22 2149   labetalol (NORMODYNE) injection 5 mg  5 mg Intravenous Q2H PRN Synetta Fail, MD   5 mg at 10/01/22 1700   metoprolol succinate (TOPROL-XL) 24 hr tablet 100 mg  100 mg Oral BID Synetta Fail, MD   100 mg at 10/01/22 2138   montelukast (SINGULAIR) tablet 10 mg  10 mg Oral QHS Synetta Fail, MD   10 mg at 10/01/22 2138   ondansetron [facility]) injection 4 mg  4 mg Intravenous Q6H PRN Carollee Herter, DO   4 mg at 10/01/22 2136   paliperidone (INVEGA) 24 hr tablet 6 mg  6 mg Oral QHS Synetta Fail, MD   6 mg at 10/01/22 2253   pantoprazole (PROTONIX) EC tablet 40 mg  40 mg Oral BID Synetta Fail, MD   40 mg at 10/01/22 2139   polyethylene glycol (MIRALAX / GLYCOLAX) packet 17 g  17 g Oral Daily PRN Synetta Fail, MD       sodium chloride flush (NS) 0.9 % injection 3 mL  3 mL Intravenous Q12H Synetta Fail, MD   3 mL at 10/01/22 2137   sucralfate (CARAFATE) 1 GM/10ML suspension 1 g  1 g Oral Daily PRN Synetta Fail, MD       umeclidinium bromide (INCRUSE ELLIPTA) 62.5 MCG/ACT 1 puff  1 puff Inhalation Daily Synetta Fail, MD       Facility-Administered Medications Ordered in Other Encounters  Medication Dose Route Frequency Provider Last Rate Last Admin   acetaminophen (OFIRMEV) IVPB    PRN Illene Silver, CRNA   1,000 mg at 04/19/11 0910   glycopyrrolate (ROBINUL) injection    PRN Illene Silver, CRNA   0.8 mg at 04/19/11 1037    REVIEW OF SYSTEMS:  [X]  denotes positive finding, [ ]  denotes negative finding Cardiac  Comments:  Chest pain or chest pressure:    Shortness of breath upon exertion: x   Short of breath when lying flat:    Irregular heart rhythm: x       Vascular     Pain in calf, thigh, or hip brought on by ambulation:    Pain in feet at night that wakes you up from your sleep:     Blood clot in your veins:    Leg swelling:  x       Pulmonary    Oxygen at home:    Productive cough:     Wheezing:         Neurologic    Sudden weakness in arms or legs:     Sudden numbness in arms or legs:     Sudden onset of difficulty speaking or slurred speech:    Temporary loss of vision in one eye:     Problems with dizziness:         Gastrointestinal    Blood in stool:     Vomited blood:         Genitourinary    Burning when urinating:     Blood in urine:        Psychiatric    Major depression:         Hematologic    Bleeding problems:    Problems with blood clotting too easily:        Skin    Rashes or ulcers:  Constitutional    Fever or chills:    -  PHYSICAL EXAM:   Vitals:   10/01/22 2137 10/02/22 0007 10/02/22 0448 10/02/22 0550  BP: (!) 146/94 (!) 150/98 139/86   Pulse: 62 61 60   Resp:  17 16   Temp:   97.9 F (36.6 C)   TempSrc:   Oral   SpO2:  97% 99%   Weight:    84.2 kg  Height:       Body mass index is 24.5 kg/m. GENERAL: The patient is a well-nourished male, in no acute distress. The vital signs are documented above. CARDIAC: There is a regular rate and rhythm.  VASCULAR: I do not detect carotid bruits. He has palpable femoral and dorsalis pedis pulses. PULMONARY: There is good air exchange bilaterally without wheezing or rales. ABDOMEN: He has mild abdominal tenderness. MUSCULOSKELETAL: There are no major deformities. NEUROLOGIC: No focal weakness or paresthesias are detected. SKIN: There are no ulcers or rashes noted. PSYCHIATRIC: The patient has a normal affect.  DATA:    LABS: His creatinine today is 1.77.  GFR is 43.  CT ANGIO CHEST ABDOMEN PELVIS: His infrarenal abdominal aortic aneurysm is stable in size.  The maximum diameter is 3.9 cm.  An incidental finding is an 11 mm x 8 mm penetrating ulcer  or ulcerated plaque along the inferior aspect of the transverse thoracic aorta.  There were findings on CT scan concerning for possible acute calculus cholecystitis.  Waverly [facility]

## 2022-10-02 NOTE — Progress Notes (Signed)
PROGRESS NOTE    Peter Marsh  WNU:[id number] DOB: [birth year] DOA: 10/01/2022 PCP: Tracey Harries, MD  61/M w systolic CHF, EF 03-47%, atrial flutter, second-degree AV block type II, sinus node dysfunction, status post pacemaker, CAD, chronic pancreatitis, asthma-COPD overlap, schizophrenia, SBO, hypertension, hyperlipidemia, PUD, GERD, hepatitis C, CKD 3B, AAA presented with abdominal pain.  He had an NSTEMI in January, with resultant placement of drug-eluting stent to LAD. -Admitted with nausea and abdominal pain, workup in the ED noted acute cholecystitis, creatinine 1.9, CT in addition to cholecystitis also noted thoracic aortic aneurysm with penetrating aortic ulcer -Seen by general surgery and cardiology in consultation   Subjective: Feels fair, no events overnight, some abdominal discomfort  Assessment and Plan:  Acute cholecystitis -Confounded by recent NSTEMI with DES to LAD in January 2024 on aspirin and Plavix -General surgery and cardiology consulting -Per cards would recommend against stopping antiplatelet therapy, anticipate likely end up with cholecystostomy tube, await surgical input today -Add IV Unasyn, supportive care   Aortic aneurysm with ulcer -Known thoracic aortic aneurysm, CT with enlargement, 3.9 cm compared to 3.4 in 2022, in addition has a penetrating ulcer -Appreciate vascular surgery input, recommended follow-up with CT angio in 4 to 6 weeks -Blood pressure controlled, continue aspirin and Plavix   Left renal cyst -Defer further workup to PCP   Chronic pancreatitis -Noted on imaging as well, supportive care   Hemoccult positive > FOBT was positive in the ED.  -Hemoglobin stable, continue aspirin and Plavix, add PPI  Chronic combined systolic and diastolic CHF -Echo in January showed EF 40-45%, G1 DD. -Clinically appears euvolemic -Metoprolol and Isordil   CAD - NSTEMI in January status post DES to LAD -Continue aspirin, Plavix, metoprolol,  Zetia and Isordil   Hypertension - Continue home clonidine, hydralazine, metoprolol, Isordil   Hyperlipidemia - Continue home Zetia   Atrial flutter History of second-degree AV block type II Sinus node dysfunction > Status post pacemaker.  Sinus rhythm at this time   GERD PUD - Continue home PPI and as needed Carafate   CKD 3B -Stable   Schizophrenia > Stable for years. - Continue home bupropion and Invega   Asthma-COPD overlap syndrome - Continue home Singulair, Breztri, as needed albuterol   DVT prophylaxis:      SCDs Code Status:              Full Family Communication: No family at bedside Disposition Plan: Home pending above workup  Consultants: General surgery, vascular surgery, cardiology   Procedures:   Antimicrobials:    Objective: Vitals:   10/02/22 0550 10/02/22 0803 10/02/22 0812 10/02/22 0903  BP:  (!) 131/93    Pulse:  60  61  Resp:      Temp:  97.8 F (36.6 C)    TempSrc:  Oral    SpO2:  99% 98%   Weight: 84.2 kg     Height:        Intake/Output Summary (Last 24 hours) at 10/02/2022 0948 Last data filed at 10/02/2022 0700 Gross per 24 hour  Intake --  Output 900 ml  Net -900 ml   Filed Weights   10/01/22 0413 10/02/22 0550  Weight: 83.9 kg 84.2 kg    Examination:  General exam: Appears calm and comfortable  Respiratory system: Clear to auscultation Cardiovascular system: S1 & S2 heard, RRR.  Abd: Soft, mild right-sided tenderness, no rigidity or rebound, bowel sounds present Central nervous system: Alert and oriented. No focal neurological deficits. Extremities:  no edema Skin: No rashes Psychiatry:  Mood & affect appropriate.     Data Reviewed:   CBC: Recent Labs  Lab 10/01/22 0420 10/02/22 0243  WBC 8.2 8.3  HGB 13.4 12.9*  HCT 42.5 39.9  MCV 88.9 88.5  PLT 210 186   Basic Metabolic Panel: Recent Labs  Lab 10/01/22 0420 10/02/22 0243  NA 133* 133*  K 4.6 5.1  CL 100 107  CO2 25 19*  GLUCOSE 104* 103*   BUN 25* 21  CREATININE 1.94* 1.77*  CALCIUM 8.7* 8.1*   GFR: Estimated Creatinine Clearance: 48.3 mL/min (A) (by C-G formula based on SCr of 1.77 mg/dL (H)). Liver Function Tests: Recent Labs  Lab 10/01/22 0420 10/02/22 0243  AST 23 20  ALT 17 15  ALKPHOS 66 64  BILITOT 0.8 0.6  PROT 6.9 6.1*  ALBUMIN 3.7 3.3*   Recent Labs  Lab 10/01/22 0420  LIPASE 32   No results for input(s): "AMMONIA" in the last 168 hours. Coagulation Profile: No results for input(s): "INR", "PROTIME" in the last 168 hours. Cardiac Enzymes: No results for input(s): "CKTOTAL", "CKMB", "CKMBINDEX", "TROPONINI" in the last 168 hours. BNP (last 3 results) No results for input(s): "PROBNP" in the last 8760 hours. HbA1C: No results for input(s): "HGBA1C" in the last 72 hours. CBG: No results for input(s): "GLUCAP" in the last 168 hours. Lipid Profile: No results for input(s): "CHOL", "HDL", "LDLCALC", "TRIG", "CHOLHDL", "LDLDIRECT" in the last 72 hours. Thyroid Function Tests: No results for input(s): "TSH", "T4TOTAL", "FREET4", "T3FREE", "THYROIDAB" in the last 72 hours. Anemia Panel: No results for input(s): "VITAMINB12", "FOLATE", "FERRITIN", "TIBC", "IRON", "RETICCTPCT" in the last 72 hours. Urine analysis:    Component Value Date/Time   COLORURINE YELLOW 10/01/2022 0420   APPEARANCEUR CLEAR 10/01/2022 0420   LABSPEC 1.009 10/01/2022 0420   PHURINE 6.0 10/01/2022 0420   GLUCOSEU NEGATIVE 10/01/2022 0420   HGBUR NEGATIVE 10/01/2022 0420   BILIRUBINUR NEGATIVE 10/01/2022 0420   BILIRUBINUR neg 09/20/2020 1024   KETONESUR NEGATIVE 10/01/2022 0420   PROTEINUR NEGATIVE 10/01/2022 0420   UROBILINOGEN 0.2 09/20/2020 1024   UROBILINOGEN 0.2 04/17/2011 1030   NITRITE NEGATIVE 10/01/2022 0420   LEUKOCYTESUR NEGATIVE 10/01/2022 0420   Sepsis Labs: @LABRCNTIP (procalcitonin:4,lacticidven:4)  )No results found for this or any previous visit (from the past 240 hour(s)).   Radiology Studies: US  Abdomen Limited RUQ (LIVER/GB)  Result Date: 10/01/2022 CLINICAL DATA:  Gallstones. EXAM: ULTRASOUND ABDOMEN LIMITED RIGHT UPPER QUADRANT COMPARISON:  CT from earlier today FINDINGS: Gallbladder: 5 mm stone identified within the gallbladder. The gallbladder appears partially decompressed with wall thickening measuring up to 6.6 mm. Positive sonographic Murphy's sign was reported by the sonographer. Common bile duct: Diameter: 3.1 Liver: No focal lesion identified. Within normal limits in parenchymal echogenicity. Portal vein is patent on color Doppler imaging with normal direction of blood flow towards the liver. Other: Diminished exam detail due to overlying bowel gas. IMPRESSION: Gallstone with gallbladder wall thickening and positive sonographic Murphy's sign. Cannot exclude acute cholecystitis. Electronically Signed   By: Signa Kell M.D.   On: 10/01/2022 10:18   CT Angio Chest/Abd/Pel for Dissection W and/or W/WO  Result Date: 10/01/2022 CLINICAL DATA:  Acute aortic syndrome. EXAM: CT ANGIOGRAPHY CHEST, ABDOMEN AND PELVIS TECHNIQUE: Non-contrast CT of the chest was initially obtained. Multidetector CT imaging through the chest, abdomen and pelvis was performed using the standard protocol during bolus administration of intravenous contrast. Multiplanar reconstructed images and MIPs were obtained and reviewed to evaluate the  vascular anatomy. RADIATION DOSE REDUCTION: This exam was performed according to the departmental dose-optimization program which includes automated exposure control, adjustment of the mA and/or kV according to patient size and/or use of iterative reconstruction technique. CONTRAST:  OMNIPAQUE IOHEXOL 350 MG/ML SOLN COMPARISON:  Abdomen pelvis CT 10/21/2020 FINDINGS: CTA CHEST FINDINGS Cardiovascular: The heart size is normal. No substantial pericardial effusion. Coronary artery calcification is evident. Coronary stent device evident. Mild atherosclerotic calcification is noted  in the wall of the thoracic aorta. Left-sided permanent pacemaker evident. Precontrast imaging shows no hyperdense crescent in the wall of the thoracic aorta to suggest the presence of an acute intramural hematoma. No thoracic aortic aneurysm. Imaging after IV contrast administration shows no dissection of the thoracic aorta. Sagittal imaging reveals a penetrating ulcer or ulcerated plaque along the under surface of the transverse aorta measuring on the order of 11 x 8 mm. This is also seen on axial image 43/6. Mediastinum/Nodes: No mediastinal lymphadenopathy. No evidence for gross hilar lymphadenopathy although assessment is limited by the lack of intravenous contrast on the current study. The esophagus has normal imaging features. There is no axillary lymphadenopathy. Lungs/Pleura: Centrilobular and paraseptal emphysema evident. No suspicious pulmonary nodule or mass. No focal airspace consolidation. No pleural effusion. Musculoskeletal: No worrisome lytic or sclerotic osseous abnormality. Review of the MIP images confirms the above findings. CTA ABDOMEN AND PELVIS FINDINGS VASCULAR Aorta: Infrarenal abdominal aorta measures 3.9 x 3.7 cm in maximum orthogonal diameter. Atherosclerotic changes evident with prominent mural thrombus in the aneurysmal segment. No dissection, vasculitis or significant stenosis. Celiac: Patent without evidence of aneurysm, dissection, vasculitis or significant stenosis. SMA: Patent without evidence of aneurysm, dissection, vasculitis or significant stenosis. Renals: Both renal arteries are patent without evidence of aneurysm, dissection, vasculitis, fibromuscular dysplasia or significant stenosis. IMA: Chronic occlusion Inflow: Patent without evidence of aneurysm, dissection, vasculitis or significant stenosis. Veins: No obvious venous abnormality within the limitations of this arterial phase study. Review of the MIP images confirms the above findings. NON-VASCULAR Hepatobiliary: No  suspicious focal abnormality within the liver parenchyma. Gallbladder is decompressed which accentuates wall thickness gallbladder wall is ill-defined raising the question of pericholecystic edema. 6 mm gallstone evident. No intrahepatic or extrahepatic biliary dilation. Pancreas: Diffuse parenchymal calcification consistent with chronic pancreatitis. No main duct dilatation. Spleen: No splenomegaly. No focal mass lesion. Adrenals/Urinary Tract: No adrenal nodule or mass. Large central sinus cysts noted left kidney. There is dilatation of an upper pole calyx, similar to CT scan from 09/25/2014, potentially due to mass-effect from the large central sinus cyst. Overlying cortical thinning right kidney with levin mm stone upper interpolar region and 10 mm stone in the lower interpolar region. No associated right hydroureter. Component of right UPJ obstruction cannot be excluded. 5.5 cm exophytic low-density lesion upper pole left kidney has increased from 4.7 cm on the 2022 exam. 15 mm subcapsular lesion upper pole left kidney is similar to prior. Both of these lesions have attenuation slightly higher than would be expected for simple cyst. 8 mm nonobstructing stone identified lower pole left kidney. Left ureter unremarkable. The urinary bladder appears normal for the degree of distention. Stomach/Bowel: Stomach is unremarkable. No gastric wall thickening. No evidence of outlet obstruction. Duodenum is normally positioned as is the ligament of Treitz. No small bowel wall thickening. No small bowel dilatation. The terminal ileum is normal. The appendix is not well visualized, but there is no edema or inflammation in the region of the cecum. No gross colonic mass. No  colonic wall thickening. Lymphatic: There is no gastrohepatic or hepatoduodenal ligament lymphadenopathy. No retroperitoneal or mesenteric lymphadenopathy. No pelvic sidewall lymphadenopathy. Reproductive: The prostate gland and seminal vesicles are  unremarkable. Other: No intraperitoneal free fluid. Musculoskeletal: No worrisome lytic or sclerotic osseous abnormality. Review of the MIP images confirms the above findings. IMPRESSION: 1. No evidence for acute intramural hematoma or dissection in the thoracoabdominal aorta. 2. 11 x 8 mm penetrating ulcer or ulcerated plaque along the under surface of the transverse aorta. 3. 3.9 x 3.7 cm infrarenal abdominal aortic aneurysm. 4. Gallbladder is decompressed which accentuates wall thickness. Gallbladder wall is ill-defined raising the question of pericholecystic edema. There is a 6 mm gallstone evident. Right upper quadrant ultrasound may prove helpful to further evaluate. 5. Large central sinus cyst left kidney with dilatation of an upper pole calyx, similar to CT scan from 09/25/2014, potentially due to mass-effect from the large central sinus cyst. Component of right UPJ obstruction cannot be excluded. 6. Bilateral nonobstructing nephrolithiasis. 7. 5.5 cm exophytic low-density lesion upper pole left kidney has increased from 4.7 cm on the 2022 exam. Another smaller upper pole left renal lesion is also mildly progressed. Both of these lesions have attenuation slightly higher than would be expected for simple cysts. While likely cysts complicated by proteinaceous debris or hemorrhage. Given the presence of permanent pacemaker, follow-up CT abdomen with and without contrast may be warranted to exclude hypoenhancing neoplasm. 8. Chronic pancreatitis. 9.  Emphysema (ICD10-J43.9). Electronically Signed   By: Kennith Center M.D.   On: 10/01/2022 08:51   US Aorta  Result Date: 10/01/2022 CLINICAL DATA:  Abdominal pain. History of abdominal aortic aneurysm. EXAM: ULTRASOUND OF ABDOMINAL AORTA TECHNIQUE: Ultrasound examination of the abdominal aorta and proximal common iliac arteries was performed to evaluate for aneurysm. Additional color and Doppler images of the distal aorta were obtained to document patency.  COMPARISON:  CT abdomen/pelvis 10/21/2020 FINDINGS: Abdominal aortic measurements as follows: Proximal:  2.7 x 2.9 cm Mid:  2.1 x 2.0 cm Distal:  3.5 x 3.9 cm Patent: Yes, peak systolic velocity is 63.6 cm/s Right common iliac artery: 1.0 x 1.1 cm Left common iliac artery: 0.7 x 1.1 cm IMPRESSION: 3.9 cm abdominal aortic aneurysm. Infrarenal abdominal aorta was 3.4 cm maximum diameter when remeasured on the study from 10/21/2020 compatible with mild interval progression. Ultrasound can be insensitive for acute aortic aneurysm leak and dissection. Recommend follow-up ultrasound every 2 years. This recommendation follows ACR consensus guidelines: White Paper of the ACR Incidental Findings Committee II on Vascular Findings. J Am Coll Radiol 2013; 10:789-794. Electronically Signed   By: Kennith Center M.D.   On: 10/01/2022 07:30     Scheduled Meds:  aspirin  81 mg Oral Daily   buPROPion  150 mg Oral BID   cloNIDine  0.2 mg Oral TID   clopidogrel  75 mg Oral Daily   ezetimibe  10 mg Oral Daily   fluticasone furoate-vilanterol  1 puff Inhalation Daily   hydrALAZINE  100 mg Oral TID   isosorbide dinitrate  30 mg Oral TID   metoprolol succinate  100 mg Oral BID   montelukast  10 mg Oral QHS   paliperidone  6 mg Oral QHS   pantoprazole  40 mg Oral BID   sodium chloride flush  3 mL Intravenous Q12H   umeclidinium bromide  1 puff Inhalation Daily   Continuous Infusions:   LOS: 0 days    Time spent:    Zannie Cove, MD  [facility]   10/02/2022, 9:48 AM

## 2022-10-02 NOTE — Plan of Care (Signed)
Request to IR for possible percutaneous cholecystostomy as patient is not currently a surgical candidate due to need to remain on DAPT given LAD stent 05/2022.  Patient history and imaging reviewed by Dr. Fredia Sorrow who notes that the gallbladder is completely decompressed on [place], quite small on CTA chest and colon overlies gallbladder so unfortunately there is no window for percutaneous catheter placement.  Recommend repeat RUQ [place] and possibly HIDA tomorrow/early next week.  Above relayed to general surgery today via secure chat.  Please call with questions or concerns.  Lynnette Caffey, PA-C

## 2022-10-03 ENCOUNTER — Inpatient Hospital Stay (HOSPITAL_COMMUNITY): Payer: 59

## 2022-10-03 ENCOUNTER — Encounter: Payer: 59 | Admitting: Urology

## 2022-10-03 DIAGNOSIS — I4892 Unspecified atrial flutter: Secondary | ICD-10-CM | POA: Diagnosis not present

## 2022-10-03 DIAGNOSIS — K810 Acute cholecystitis: Secondary | ICD-10-CM | POA: Diagnosis not present

## 2022-10-03 DIAGNOSIS — I2510 Atherosclerotic heart disease of native coronary artery without angina pectoris: Secondary | ICD-10-CM | POA: Diagnosis not present

## 2022-10-03 DIAGNOSIS — I495 Sick sinus syndrome: Secondary | ICD-10-CM | POA: Diagnosis not present

## 2022-10-03 LAB — OCCULT BLOOD, POC DEVICE: Fecal Occult Bld: POSITIVE — AB

## 2022-10-03 MED ORDER — AMLODIPINE BESYLATE 5 MG PO TABS
5.0000 mg | ORAL_TABLET | Freq: Every day | ORAL | Status: DC
  Administered 2022-10-03 – 2022-10-05 (×3): 5 mg via ORAL
  Filled 2022-10-03 (×3): qty 1

## 2022-10-03 NOTE — Progress Notes (Addendum)
Rounding Note    Patient Name: Peter Marsh Date of Encounter: 10/03/2022  [facility] Cardiologist: Reatha Harps, MD   Subjective   Patient without acute complaints this morning. No chest pain, shortness of breath, lower extremity edema.  Inpatient Medications    Scheduled Meds:  aspirin  81 mg Oral Daily   buPROPion  150 mg Oral BID   cloNIDine  0.2 mg Oral TID   clopidogrel  75 mg Oral Daily   ezetimibe  10 mg Oral Daily   fluticasone furoate-vilanterol  1 puff Inhalation Daily   hydrALAZINE  100 mg Oral TID   isosorbide dinitrate  30 mg Oral TID   metoprolol succinate  100 mg Oral BID   montelukast  10 mg Oral QHS   paliperidone  6 mg Oral QHS   pantoprazole  40 mg Oral BID   sodium chloride flush  3 mL Intravenous Q12H   umeclidinium bromide  1 puff Inhalation Daily   Continuous Infusions:  ampicillin-sulbactam (UNASYN) IV 3 g (10/03/22 0407)   PRN Meds: acetaminophen **OR** acetaminophen, albuterol, alum & mag hydroxide-simeth, ondansetron (ZOFRAN) IV, polyethylene glycol, sucralfate   Vital Signs    Vitals:   10/02/22 1608 10/02/22 1923 10/03/22 0535 10/03/22 0804  BP: 133/89 (!) 154/88 (!) 151/99   Pulse: 65 62 87   Resp: 18 16 15    Temp: 98.1 F (36.7 C) 98.1 F (36.7 C) 98 F (36.7 C)   TempSrc: Oral Oral Oral   SpO2: 97% 97% 96% 94%  Weight:   82.7 kg   Height:        Intake/Output Summary (Last 24 hours) at 10/03/2022 0924 Last data filed at 10/03/2022 0700 Gross per 24 hour  Intake 340.06 ml  Output 1050 ml  Net -709.94 ml      10/03/2022    5:35 AM 10/02/2022    5:50 AM 10/01/2022    4:13 AM  Last 3 Weights  Weight (lbs) 182 lb 6.4 oz 185 lb 11.2 oz 185 lb  Weight (kg) 82.736 kg 84.233 kg 83.915 kg      Telemetry    Not currently on telemetry - Personally Reviewed  ECG    No new ECG tracing - Personally Reviewed  Physical Exam   GEN: No acute distress.   Neck: No JVD Cardiac: RRR, no murmurs, rubs, or  gallops.  Respiratory: Clear to auscultation bilaterally. GI: Soft, nontender, non-distended  MS: No edema; No deformity. Neuro:  Nonfocal  Psych: Normal affect   Labs    High Sensitivity Troponin:  No results for input(s): "TROPONINIHS" in the last 720 hours.   Chemistry Recent Labs  Lab 10/01/22 0420 10/02/22 0243  NA 133* 133*  K 4.6 5.1  CL 100 107  CO2 25 19*  GLUCOSE 104* 103*  BUN 25* 21  CREATININE 1.94* 1.77*  CALCIUM 8.7* 8.1*  PROT 6.9 6.1*  ALBUMIN 3.7 3.3*  AST 23 20  ALT 17 15  ALKPHOS 66 64  BILITOT 0.8 0.6  GFRNONAA 38* 43*  ANIONGAP 8 7    Lipids No results for input(s): "CHOL", "TRIG", "HDL", "LABVLDL", "LDLCALC", "CHOLHDL" in the last 168 hours.  Hematology Recent Labs  Lab 10/01/22 0420 10/02/22 0243  WBC 8.2 8.3  RBC 4.78 4.51  HGB 13.4 12.9*  HCT 42.5 39.9  MCV 88.9 88.5  MCH 28.0 28.6  MCHC 31.5 32.3  RDW 12.5 12.5  PLT 210 186   Thyroid No results for input(s): "TSH", "  FREET4" in the last 168 hours.  BNPNo results for input(s): "BNP", "PROBNP" in the last 168 hours.  DDimer No results for input(s): "DDIMER" in the last 168 hours.   Radiology    ECHOCARDIOGRAM COMPLETE  Result Date: 10/02/2022    ECHOCARDIOGRAM REPORT   Patient Name:   OSBY MICHAELSON Date of Exam: 10/02/2022 Medical Rec #:  [id number]       Height:       73.0 in Accession #:    [id number]      Weight:       185.7 lb Date of Birth:  [birth year]       BSA:          2.085 m Patient Age:    62 years        BP:           131/93 mmHg Patient Gender: M               HR:           69 bpm. Exam Location:  Inpatient Procedure: 2D Echo, Cardiac Doppler and Color Doppler Indications:    CAD Native Vessel I25.10  History:        Patient has prior history of Echocardiogram examinations, most                 recent 05/26/2022. CAD, Pacemaker, CKD 3 and COPD; Risk                 Factors:Former Smoker, Dyslipidemia and Hypertension.  Sonographer:    Dondra Prader RVT RCS Referring Phys: [phone]  Tally Mattox IMPRESSIONS  1. Left ventricular ejection fraction, by estimation, is 55 to 60%. Left ventricular ejection fraction by 3D volume is 57 %. The left ventricle has normal function. The left ventricle has no regional wall motion abnormalities. There is mild asymmetric left ventricular hypertrophy of the basal-septal segment. Left ventricular diastolic parameters are consistent with Grade I diastolic dysfunction (impaired relaxation).  2. Right ventricular systolic function is normal. The right ventricular size is normal.  3. The mitral valve is abnormal. Mild mitral valve regurgitation.  4. The aortic valve is tricuspid. Aortic valve regurgitation is not visualized. Comparison(s): Changes from prior study are noted. 05/26/2022: LVEF 40-45%, pardoxical septal motion, apical hypokinesis. FINDINGS  Left Ventricle: Left ventricular ejection fraction, by estimation, is 55 to 60%. Left ventricular ejection fraction by 3D volume is 57 %. The left ventricle has normal function. The left ventricle has no regional wall motion abnormalities. The left ventricular internal cavity size was normal in size. There is mild asymmetric left ventricular hypertrophy of the basal-septal segment. Abnormal (paradoxical) septal motion consistent with post-operative status. Left ventricular diastolic parameters are consistent with Grade I diastolic dysfunction (impaired relaxation). Indeterminate filling pressures. Right Ventricle: The right ventricular size is normal. No increase in right ventricular wall thickness. Right ventricular systolic function is normal. Left Atrium: Left atrial size was normal in size. Right Atrium: Right atrial size was normal in size. Pericardium: There is no evidence of pericardial effusion. Mitral Valve: The mitral valve is abnormal. There is mild thickening of the anterior mitral valve leaflet(s). Mild mitral valve regurgitation. Tricuspid Valve: The tricuspid valve is grossly normal. Tricuspid valve  regurgitation is mild. Aortic Valve: The aortic valve is tricuspid. Aortic valve regurgitation is not visualized. Aortic valve mean gradient measures 2.0 mmHg. Aortic valve peak gradient measures 3.7 mmHg. Aortic valve area, by VTI measures 2.46 cm. Pulmonic Valve: The pulmonic valve was  not well visualized. Pulmonic valve regurgitation is not visualized. Aorta: The aortic root and ascending aorta are structurally normal, with no evidence of dilitation. Venous: The inferior vena cava was not well visualized. IAS/Shunts: No atrial level shunt detected by color flow Doppler.  LEFT VENTRICLE PLAX 2D LVIDd:         4.00 cm         Diastology LVIDs:         2.60 cm         LV e' medial:    5.96 cm/s LV PW:         1.00 cm         LV E/e' medial:  9.1 LV IVS:        1.30 cm         LV e' lateral:   8.94 cm/s LVOT diam:     1.90 cm         LV E/e' lateral: 6.1 LV SV:         48 LV SV Index:   23 LVOT Area:     2.84 cm        3D Volume EF                                LV 3D EF:    Left                                             ventricul                                             ar                                             ejection                                             fraction                                             by 3D                                             volume is                                             57 %.                                 3D Volume EF:  3D EF:        57 %                                LV EDV:       119 ml                                LV ESV:       52 ml                                LV SV:        68 ml RIGHT VENTRICLE RV S prime:     11.20 cm/s TAPSE (M-mode): 2.1 cm LEFT ATRIUM             Index        RIGHT ATRIUM           Index LA diam:        3.30 cm 1.58 cm/m   RA Area:     15.50 cm LA Vol (A2C):   42.0 ml 20.15 ml/m  RA Volume:   36.30 ml  17.41 ml/m LA Vol (A4C):   46.2 ml 22.16 ml/m LA Biplane Vol: 46.4 ml 22.26 ml/m   AORTIC VALVE                    PULMONIC VALVE AV Area (Vmax):    2.25 cm     PV Vmax:          0.87 m/s AV Area (Vmean):   2.17 cm     PV Peak grad:     3.1 mmHg AV Area (VTI):     2.46 cm     PR End Diast Vel: 4.24 msec AV Vmax:           96.40 cm/s AV Vmean:          65.000 cm/s AV VTI:            0.196 m AV Peak Grad:      3.7 mmHg AV Mean Grad:      2.0 mmHg LVOT Vmax:         76.40 cm/s LVOT Vmean:        49.700 cm/s LVOT VTI:          0.170 m LVOT/AV VTI ratio: 0.87  AORTA Ao Root diam: 3.50 cm Ao Asc diam:  3.20 cm MITRAL VALVE               TRICUSPID VALVE MV Area (PHT): 2.68 cm    TR Peak grad:   32.9 mmHg MV Decel Time: 283 msec    TR Vmax:        287.00 cm/s MV E velocity: 54.50 cm/s MV A velocity: 62.90 cm/s  SHUNTS MV E/A ratio:  0.87        Systemic VTI:  0.17 m                            Systemic Diam: 1.90 cm Zoila Shutter MD Electronically signed by Zoila Shutter MD Signature Date/Time: 10/02/2022/2:19:49 PM    Final    US Abdomen Limited RUQ (LIVER/GB)  Result Date: 10/01/2022 CLINICAL DATA:  Gallstones. EXAM: ULTRASOUND ABDOMEN LIMITED RIGHT UPPER QUADRANT COMPARISON:  CT from earlier today FINDINGS: Gallbladder: 5 mm stone identified within the gallbladder. The gallbladder appears partially decompressed with wall thickening measuring up to 6.6 mm. Positive sonographic Murphy's sign was reported by the sonographer. Common bile duct: Diameter: 3.1 Liver: No focal lesion identified. Within normal limits in parenchymal echogenicity. Portal vein is patent on color Doppler imaging with normal direction of blood flow towards the liver. Other: Diminished exam detail due to overlying bowel gas. IMPRESSION: Gallstone with gallbladder wall thickening and positive sonographic Murphy's sign. Cannot exclude acute cholecystitis. Electronically Signed   By: Signa Kell M.D.   On: 10/01/2022 10:18    Cardiac Studies   TTE 05/26/2022  1. Left ventricular ejection fraction, by estimation, is 40 to  45%. The  left ventricle has mildly decreased function. The left ventricle  demonstrates regional wall motion abnormalities. Abnormal (paradoxical)  septal motion, consistent with left bundle  branch block. Apical hypokinesis.      There is severe asymmetric left ventricular hypertrophy of the  basal-septal segment. Left ventricular diastolic parameters are consistent  with Grade I diastolic dysfunction (impaired relaxation).   2. Right ventricular systolic function is normal. The right ventricular  size is normal.   3. The mitral valve is normal in structure. Trivial mitral valve  regurgitation. No evidence of mitral stenosis.   4. The aortic valve is tricuspid. Aortic valve regurgitation is not  visualized. No aortic stenosis is present.    LHC 05/26/2022    Prox RCA lesion is 25% stenosed.   Prox LAD lesion is 99% stenosed.  A drug-eluting stent was successfully placed using a SYNERGY XD [id number] postdilated to greater than 4 m in diameter and optimized with intravascular ultrasound..   Post intervention, there is a 0% residual stenosis.   LV end diastolic pressure is normal.   There is no aortic valve stenosis.   Successful PCI of the severe lesion in the proximal LAD.  Continue dual antiplatelet therapy for at least 12 months.  Continue aggressive secondary prevention.  Will decrease his dose of isosorbide from 4 times a day down to 3 times a day.  This can gradually be weaned off based on his symptoms.  Watch overnight.  Hydrate aggressively due to renal insufficiency.  Specific emphasis was placed on minimizing contrast.  Only 50 cc of contrast was used for the procedure.  Plan for discharge tomorrow.  Patient Profile     62 y.o. male with history of CAD, systolic CHF, CKD IV, AAA, hypertension, sick sinus syndrome s/p PPM, atrial flutter (not on OAC due to hx GI bleed), schizophrenia. Patient admitted with acute cholecystitis.   Assessment & Plan    CAD with recent PCI to  proximal LAD Acute cholecystitis  Patient with DES to prox LAD 05/26/22, on ASA/Plavix. Now admitted with acute cholecystitis and cardiology asked for input on holding Plavix. Given location of stent, would be risky to hold Plavix prior to completing minimum 6 months DAPT. Surgery has seen patient and feel that situation is not amenable to percutaneous drain management.   Given response to ABX, would favor allowing patient to complete 6 months of DAPT and consider surgery in July when Plavix can more safely be held.   Paroxysmal atrial fibrillation SSS s/p PPM  Telemetry this admission has not shown evidence of afib. Has not been on OAC due to hx GI bleeding. Device with proper function per ECG earlier this admission. Not currently on telemetry.   Hypertension  Patient remains hypertensive despite  Clonidine 0.2mg  TID, Isordil 30mg  TID, Metoprolol Succinate 100mg  BID, Hydralazine 100mg  TID.  BP management challenging due to CKD.  Will add amlodipine 5mg . Otherwise continue above regimen.   Chronic HFmrEF, LVEF now preserved 55-60%  LVEF previously 40-45%, now 55-60% per TTE this admission.   GDMT limited by CKD IV. Continue Clonidine 0.2mg  TID, Isordil 30mg  TID, Metoprolol Succinate 100mg  BID, Hydralazine 100mg  TID.   Per primary team: CKD IV      For questions or updates, please contact [facility] Please consult [email or web address] for contact info under        Signed, Perlie Gold, PA-C  10/03/2022, 9:24 AM    I have seen and examined the patient along with Perlie Gold, PA-C .  I have reviewed the chart, notes and new data.  I agree with PA/NP's note.  Key new complaints: Feels remarkably improved, no abdominal pain. Key examination changes: No evidence of heart failure, no meaningful arrhythmia. Key new findings / data: Normal WBC and liver function tests.  Improving renal parameters.  PLAN: He has declined percutaneous cholecystostomy tube, but is improving on  antibiotics.  Receiving an repeat abdominal ultrasound at at this time.  Hopefully we can temporize with conventional measures, antibiotics until 6 months have passed from his drug-eluting stent to the proximal LAD (that is until the second half of July).  Thurmon Fair, MD, [facility] [phone] 10/03/2022, 10:42 AM

## 2022-10-03 NOTE — Progress Notes (Signed)
Subjective/Chief Complaint: Denies any abdominal pain or nausea   Objective: Vital signs in last 24 hours: Temp:  [98 F (36.7 C)-98.1 F (36.7 C)] 98 F (36.7 C) (05/28 0535) Pulse Rate:  [62-87] 87 (05/28 0535) Resp:  [15-18] 15 (05/28 0535) BP: (133-196)/(88-106) 151/99 (05/28 0535) SpO2:  [94 %-97 %] 94 % (05/28 0804) Weight:  [82.7 kg] 82.7 kg (05/28 0535) Last BM Date : 09/27/22  Intake/Output from previous day: 05/27 0701 - 05/28 0700 In: 340.1 [P.O.:240; IV Piggyback:100.1] Out: 1050 [Urine:1050] Intake/Output this shift: No intake/output data recorded.  General appearance: alert and cooperative Resp: clear to auscultation bilaterally Cardio: regular rate and rhythm GI: soft, nontender  Lab Results:  Recent Labs    10/01/22 0420 10/02/22 0243  WBC 8.2 8.3  HGB 13.4 12.9*  HCT 42.5 39.9  PLT 210 186    BMET Recent Labs    10/01/22 0420 10/02/22 0243  NA 133* 133*  K 4.6 5.1  CL 100 107  CO2 25 19*  GLUCOSE 104* 103*  BUN 25* 21  CREATININE 1.94* 1.77*  CALCIUM 8.7* 8.1*    PT/INR No results for input(s): "LABPROT", "INR" in the last 72 hours. ABG No results for input(s): "PHART", "HCO3" in the last 72 hours.  Invalid input(s): "PCO2", "PO2"  Studies/Results: ECHOCARDIOGRAM COMPLETE  Result Date: 10/02/2022    ECHOCARDIOGRAM REPORT   Patient Name:   Peter Marsh Date of Exam: 10/02/2022 Medical Rec #:  [id number]       Height:       73.0 in Accession #:    [id number]      Weight:       185.7 lb Date of Birth:  1960/06/06       BSA:          2.085 m Patient Age:    62 years        BP:           131/93 mmHg Patient Gender: M               HR:           69 bpm. Exam Location:  Inpatient Procedure: 2D Echo, Cardiac Doppler and Color Doppler Indications:    CAD Native Vessel I25.10  History:        Patient has prior history of Echocardiogram examinations, most                 recent 05/26/2022. CAD, Pacemaker, CKD 3 and COPD; Risk                  Factors:Former Smoker, Dyslipidemia and Hypertension.  Sonographer:    Dondra Prader RVT RCS Referring Phys: [phone] MIHAI CROITORU IMPRESSIONS  1. Left ventricular ejection fraction, by estimation, is 55 to 60%. Left ventricular ejection fraction by 3D volume is 57 %. The left ventricle has normal function. The left ventricle has no regional wall motion abnormalities. There is mild asymmetric left ventricular hypertrophy of the basal-septal segment. Left ventricular diastolic parameters are consistent with Grade I diastolic dysfunction (impaired relaxation).  2. Right ventricular systolic function is normal. The right ventricular size is normal.  3. The mitral valve is abnormal. Mild mitral valve regurgitation.  4. The aortic valve is tricuspid. Aortic valve regurgitation is not visualized. Comparison(s): Changes from prior study are noted. 05/26/2022: LVEF 40-45%, pardoxical septal motion, apical hypokinesis. FINDINGS  Left Ventricle: Left ventricular ejection fraction, by estimation, is 55 to 60%. Left ventricular ejection fraction  by 3D volume is 57 %. The left ventricle has normal function. The left ventricle has no regional wall motion abnormalities. The left ventricular internal cavity size was normal in size. There is mild asymmetric left ventricular hypertrophy of the basal-septal segment. Abnormal (paradoxical) septal motion consistent with post-operative status. Left ventricular diastolic parameters are consistent with Grade I diastolic dysfunction (impaired relaxation). Indeterminate filling pressures. Right Ventricle: The right ventricular size is normal. No increase in right ventricular wall thickness. Right ventricular systolic function is normal. Left Atrium: Left atrial size was normal in size. Right Atrium: Right atrial size was normal in size. Pericardium: There is no evidence of pericardial effusion. Mitral Valve: The mitral valve is abnormal. There is mild thickening of the anterior mitral valve  leaflet(s). Mild mitral valve regurgitation. Tricuspid Valve: The tricuspid valve is grossly normal. Tricuspid valve regurgitation is mild. Aortic Valve: The aortic valve is tricuspid. Aortic valve regurgitation is not visualized. Aortic valve mean gradient measures 2.0 mmHg. Aortic valve peak gradient measures 3.7 mmHg. Aortic valve area, by VTI measures 2.46 cm. Pulmonic Valve: The pulmonic valve was not well visualized. Pulmonic valve regurgitation is not visualized. Aorta: The aortic root and ascending aorta are structurally normal, with no evidence of dilitation. Venous: The inferior vena cava was not well visualized. IAS/Shunts: No atrial level shunt detected by color flow Doppler.  LEFT VENTRICLE PLAX 2D LVIDd:         4.00 cm         Diastology LVIDs:         2.60 cm         LV e' medial:    5.96 cm/s LV PW:         1.00 cm         LV E/e' medial:  9.1 LV IVS:        1.30 cm         LV e' lateral:   8.94 cm/s LVOT diam:     1.90 cm         LV E/e' lateral: 6.1 LV SV:         48 LV SV Index:   23 LVOT Area:     2.84 cm        3D Volume EF                                LV 3D EF:    Left                                             ventricul                                             ar                                             ejection  fraction                                             by 3D                                             volume is                                             57 %.                                 3D Volume EF:                                3D EF:        57 %                                LV EDV:       119 ml                                LV ESV:       52 ml                                LV SV:        68 ml RIGHT VENTRICLE RV S prime:     11.20 cm/s TAPSE (M-mode): 2.1 cm LEFT ATRIUM             Index        RIGHT ATRIUM           Index LA diam:        3.30 cm 1.58 cm/m   RA Area:     15.50 cm LA Vol (A2C):   42.0 ml 20.15  ml/m  RA Volume:   36.30 ml  17.41 ml/m LA Vol (A4C):   46.2 ml 22.16 ml/m LA Biplane Vol: 46.4 ml 22.26 ml/m  AORTIC VALVE                    PULMONIC VALVE AV Area (Vmax):    2.25 cm     PV Vmax:          0.87 m/s AV Area (Vmean):   2.17 cm     PV Peak grad:     3.1 mmHg AV Area (VTI):     2.46 cm     PR End Diast Vel: 4.24 msec AV Vmax:           96.40 cm/s AV Vmean:          65.000 cm/s AV VTI:            0.196 m AV Peak Grad:      3.7 mmHg AV Mean Grad:      2.0 mmHg LVOT  Vmax:         76.40 cm/s LVOT Vmean:        49.700 cm/s LVOT VTI:          0.170 m LVOT/AV VTI ratio: 0.87  AORTA Ao Root diam: 3.50 cm Ao Asc diam:  3.20 cm MITRAL VALVE               TRICUSPID VALVE MV Area (PHT): 2.68 cm    TR Peak grad:   32.9 mmHg MV Decel Time: 283 msec    TR Vmax:        287.00 cm/s MV E velocity: 54.50 cm/s MV A velocity: 62.90 cm/s  SHUNTS MV E/A ratio:  0.87        Systemic VTI:  0.17 m                            Systemic Diam: 1.90 cm Zoila Shutter MD Electronically signed by Zoila Shutter MD Signature Date/Time: 10/02/2022/2:19:49 PM    Final    US Abdomen Limited RUQ (LIVER/GB)  Result Date: 10/01/2022 CLINICAL DATA:  Gallstones. EXAM: ULTRASOUND ABDOMEN LIMITED RIGHT UPPER QUADRANT COMPARISON:  CT from earlier today FINDINGS: Gallbladder: 5 mm stone identified within the gallbladder. The gallbladder appears partially decompressed with wall thickening measuring up to 6.6 mm. Positive sonographic Murphy's sign was reported by the sonographer. Common bile duct: Diameter: 3.1 Liver: No focal lesion identified. Within normal limits in parenchymal echogenicity. Portal vein is patent on color Doppler imaging with normal direction of blood flow towards the liver. Other: Diminished exam detail due to overlying bowel gas. IMPRESSION: Gallstone with gallbladder wall thickening and positive sonographic Murphy's sign. Cannot exclude acute cholecystitis. Electronically Signed   By: Signa Kell M.D.   On:  10/01/2022 10:18    Anti-infectives: Anti-infectives (From admission, onward)    Start     Dose/Rate Route Frequency Ordered Stop   10/02/22 1130  Ampicillin-Sulbactam (UNASYN) 3 g in sodium chloride 0.9 % 100 mL IVPB        3 g 200 mL/hr over 30 Minutes Intravenous Every 8 hours 10/02/22 1038         Assessment/Plan: s/p * No surgery found * cholecystitis High risk for surgery per Cards. Can not come of plavix right now Not amenable to perc drain Luckily he is responding well to abx. Will continue abx for now and monitor.  From surgery standpoint okay to slowly advance diet   LOS: 1 day    Cliff Damiani A Eudell Mcphee  Mdm- moderate 10/03/2022

## 2022-10-03 NOTE — Plan of Care (Signed)
  Problem: Education: Goal: Knowledge of General Education information will improve Description: Including pain rating scale, medication(s)/side effects and non-pharmacologic comfort measures 10/03/2022 1112 by Herma Carson, RN Outcome: Progressing 10/03/2022 1112 by Herma Carson, RN Outcome: Progressing   Problem: Health Behavior/Discharge Planning: Goal: Ability to manage health-related needs will improve 10/03/2022 1112 by Herma Carson, RN Outcome: Progressing 10/03/2022 1112 by Herma Carson, RN Outcome: Progressing   Problem: Clinical Measurements: Goal: Ability to maintain clinical measurements within normal limits will improve 10/03/2022 1112 by Herma Carson, RN Outcome: Progressing 10/03/2022 1112 by Herma Carson, RN Outcome: Progressing Goal: Will remain free from infection 10/03/2022 1112 by Herma Carson, RN Outcome: Progressing 10/03/2022 1112 by Herma Carson, RN Outcome: Progressing Goal: Diagnostic test results will improve 10/03/2022 1112 by Herma Carson, RN Outcome: Progressing 10/03/2022 1112 by Herma Carson, RN Outcome: Progressing Goal: Respiratory complications will improve 10/03/2022 1112 by Herma Carson, RN Outcome: Progressing 10/03/2022 1112 by Herma Carson, RN Outcome: Progressing Goal: Cardiovascular complication will be avoided 10/03/2022 1112 by Herma Carson, RN Outcome: Progressing 10/03/2022 1112 by Herma Carson, RN Outcome: Progressing   Problem: Activity: Goal: Risk for activity intolerance will decrease 10/03/2022 1112 by Herma Carson, RN Outcome: Progressing 10/03/2022 1112 by Herma Carson, RN Outcome: Progressing   Problem: Nutrition: Goal: Adequate nutrition will be maintained 10/03/2022 1112 by Herma Carson, RN Outcome: Progressing 10/03/2022 1112 by Herma Carson, RN Outcome: Progressing   Problem: Coping: Goal: Level of anxiety will decrease 10/03/2022 1112 by Herma Carson, RN Outcome:  Progressing 10/03/2022 1112 by Herma Carson, RN Outcome: Progressing   Problem: Elimination: Goal: Will not experience complications related to bowel motility 10/03/2022 1112 by Herma Carson, RN Outcome: Progressing 10/03/2022 1112 by Herma Carson, RN Outcome: Progressing Goal: Will not experience complications related to urinary retention 10/03/2022 1112 by Herma Carson, RN Outcome: Progressing 10/03/2022 1112 by Herma Carson, RN Outcome: Progressing   Problem: Pain Managment: Goal: General experience of comfort will improve 10/03/2022 1112 by Herma Carson, RN Outcome: Progressing 10/03/2022 1112 by Herma Carson, RN Outcome: Progressing   Problem: Safety: Goal: Ability to remain free from injury will improve 10/03/2022 1112 by Herma Carson, RN Outcome: Progressing 10/03/2022 1112 by Herma Carson, RN Outcome: Progressing   Problem: Skin Integrity: Goal: Risk for impaired skin integrity will decrease 10/03/2022 1112 by Herma Carson, RN Outcome: Progressing 10/03/2022 1112 by Herma Carson, RN Outcome: Progressing

## 2022-10-03 NOTE — Progress Notes (Signed)
PROGRESS NOTE    Peter Marsh  ZOX:[id number] DOB: Aug 22, 1960 DOA: 10/01/2022 PCP: Tracey Harries, MD  61/M w systolic CHF, EF 81-19%, atrial flutter, second-degree AV block type II, sinus node dysfunction, status post pacemaker, CAD, chronic pancreatitis, asthma-COPD overlap, schizophrenia, SBO, hypertension, hyperlipidemia, PUD, GERD, hepatitis C, CKD 3B, AAA presented with abdominal pain.  He had an NSTEMI in January, with resultant placement of drug-eluting stent to LAD. -Admitted with nausea and abdominal pain, workup in the ED noted acute cholecystitis, creatinine 1.9, CT in addition to cholecystitis also noted thoracic aortic aneurysm with penetrating aortic ulcer -Seen by general surgery and cardiology in consultation, unable to stop Plavix, PERC cholecystectomy recommended -IR consulted 5/27, felt gallbladder was decompressed and Colon overlaying gallbladder with no window for [facility] catheter placement -Slowly improving on antibiotics, supportive care   Subjective: Feels okay no events overnight, denies abdominal pain today, tolerating clears  Assessment and Plan:  Acute cholecystitis -Confounded by recent NSTEMI with DES to LAD in January 2024 on aspirin and Plavix -General surgery and cardiology consulting -Cardiology recommends against stopping antiplatelet therapy, surgery and IR consulting, IR felt gallbladder was decompressed and with colon overlying the gallbladder there was no safe window for percutaneous catheter placement -Continue IV Unasyn day 2, supportive care, advance to full liquids   Aortic aneurysm with ulcer -Known thoracic aortic aneurysm, CT with enlargement, 3.9 cm compared to 3.4 in 2022, in addition has a penetrating ulcer -Appreciate vascular surgery input, recommended follow-up with CT angio in 4 to 6 weeks -Blood pressure more stable, continue aspirin and Plavix   Left renal cyst -Defer further workup to PCP   Chronic pancreatitis -Noted on imaging  as well, supportive care   Hemoccult positive > FOBT was positive in the ED.  -Hemoglobin stable, continue aspirin and Plavix, add PPI  Chronic combined systolic and diastolic CHF -Echo in January showed EF 40-45%, G1 DD. -Clinically appears euvolemic -Metoprolol and Isordil   CAD - NSTEMI in January status post DES to LAD -Continue aspirin, Plavix, metoprolol, Zetia and Isordil   Hypertension - Continue home clonidine, hydralazine, metoprolol, Isordil   Hyperlipidemia - Continue home Zetia   Atrial flutter History of second-degree AV block type II Sinus node dysfunction > Status post pacemaker.  Sinus rhythm at this time   GERD PUD - Continue home PPI and as needed Carafate   CKD 3B -Stable   Schizophrenia -Stable - Continue home bupropion and Invega   Asthma-COPD overlap syndrome - Continue home Singulair, Breztri, as needed albuterol   DVT prophylaxis:      SCDs Code Status:              Full Family Communication: No family at bedside Disposition Plan: Home pending above workup  Consultants: General surgery, vascular surgery, cardiology   Procedures:   Antimicrobials:    Objective: Vitals:   10/02/22 1608 10/02/22 1923 10/03/22 0535 10/03/22 0804  BP: 133/89 (!) 154/88 (!) 151/99   Pulse: 65 62 87   Resp: 18 16 15    Temp: 98.1 F (36.7 C) 98.1 F (36.7 C) 98 F (36.7 C)   TempSrc: Oral Oral Oral   SpO2: 97% 97% 96% 94%  Weight:   82.7 kg   Height:        Intake/Output Summary (Last 24 hours) at 10/03/2022 1107 Last data filed at 10/03/2022 0700 Gross per 24 hour  Intake 340.06 ml  Output 1050 ml  Net -709.94 ml   Filed Weights   10/01/22  0413 10/02/22 0550 10/03/22 0535  Weight: 83.9 kg 84.2 kg 82.7 kg    Examination:  General exam: Pleasant elderly male sitting up in bed, AAOx3, no distress HEENT: No JVD CVS: S1-S2, regular rhythm Lungs: Decreased breath sounds at the bases Abdomen: Soft, nontender, bowel sounds  present Extremities: No edema Psych: Flat affect   Data Reviewed:   CBC: Recent Labs  Lab 10/01/22 0420 10/02/22 0243  WBC 8.2 8.3  HGB 13.4 12.9*  HCT 42.5 39.9  MCV 88.9 88.5  PLT 210 186   Basic Metabolic Panel: Recent Labs  Lab 10/01/22 0420 10/02/22 0243  NA 133* 133*  K 4.6 5.1  CL 100 107  CO2 25 19*  GLUCOSE 104* 103*  BUN 25* 21  CREATININE 1.94* 1.77*  CALCIUM 8.7* 8.1*   GFR: Estimated Creatinine Clearance: 48.3 mL/min (A) (by C-G formula based on SCr of 1.77 mg/dL (H)). Liver Function Tests: Recent Labs  Lab 10/01/22 0420 10/02/22 0243  AST 23 20  ALT 17 15  ALKPHOS 66 64  BILITOT 0.8 0.6  PROT 6.9 6.1*  ALBUMIN 3.7 3.3*   Recent Labs  Lab 10/01/22 0420  LIPASE 32   No results for input(s): "AMMONIA" in the last 168 hours. Coagulation Profile: No results for input(s): "INR", "PROTIME" in the last 168 hours. Cardiac Enzymes: No results for input(s): "CKTOTAL", "CKMB", "CKMBINDEX", "TROPONINI" in the last 168 hours. BNP (last 3 results) No results for input(s): "PROBNP" in the last 8760 hours. HbA1C: No results for input(s): "HGBA1C" in the last 72 hours. CBG: No results for input(s): "GLUCAP" in the last 168 hours. Lipid Profile: No results for input(s): "CHOL", "HDL", "LDLCALC", "TRIG", "CHOLHDL", "LDLDIRECT" in the last 72 hours. Thyroid Function Tests: No results for input(s): "TSH", "T4TOTAL", "FREET4", "T3FREE", "THYROIDAB" in the last 72 hours. Anemia Panel: No results for input(s): "VITAMINB12", "FOLATE", "FERRITIN", "TIBC", "IRON", "RETICCTPCT" in the last 72 hours. Urine analysis:    Component Value Date/Time   COLORURINE YELLOW 10/01/2022 0420   APPEARANCEUR CLEAR 10/01/2022 0420   LABSPEC 1.009 10/01/2022 0420   PHURINE 6.0 10/01/2022 0420   GLUCOSEU NEGATIVE 10/01/2022 0420   HGBUR NEGATIVE 10/01/2022 0420   BILIRUBINUR NEGATIVE 10/01/2022 0420   BILIRUBINUR neg 09/20/2020 1024   KETONESUR NEGATIVE 10/01/2022 0420    PROTEINUR NEGATIVE 10/01/2022 0420   UROBILINOGEN 0.2 09/20/2020 1024   UROBILINOGEN 0.2 04/17/2011 1030   NITRITE NEGATIVE 10/01/2022 0420   LEUKOCYTESUR NEGATIVE 10/01/2022 0420   Sepsis Labs: @LABRCNTIP (procalcitonin:4,lacticidven:4)  )No results found for this or any previous visit (from the past 240 hour(s)).   Radiology Studies: US Abdomen Limited RUQ (LIVER/GB)  Result Date: 10/03/2022 CLINICAL DATA:  Calculus cholecystitis EXAM: ULTRASOUND ABDOMEN LIMITED RIGHT UPPER QUADRANT COMPARISON:  10/01/2022 FINDINGS: Gallbladder: Gallstones: Present Sludge: None Gallbladder Wall: Thickened measuring up to 5 mm. Pericholecystic fluid: None Sonographic Murphy's Sign: Negative per technologist Non-mobile, echogenic structure measuring 5 mm, appears adherent to the gallbladder wall most likely represents a polyp or tumefactive sludge. Common bile duct: Diameter: 3 mm Liver: Visualization of the liver is limited due to shadowing from adjacent air containing bowel loops. Parenchymal echogenicity: Within normal limits Contours: Normal Lesions: None Portal vein: Patent.  Hepatopetal flow Other: Incidental note of simple right renal cysts measuring 6.8 cm. IMPRESSION: Cholelithiasis with gallbladder wall thickening remains suspicious for cholecystitis. Electronically Signed   By: Acquanetta Belling M.D.   On: 10/03/2022 10:58   ECHOCARDIOGRAM COMPLETE  Result Date: 10/02/2022    ECHOCARDIOGRAM REPORT  Patient Name:   Peter Marsh Date of Exam: 10/02/2022 Medical Rec #:  [id number]       Height:       73.0 in Accession #:    [id number]      Weight:       185.7 lb Date of Birth:  April 19, 1961       BSA:          2.085 m Patient Age:    61 years        BP:           131/93 mmHg Patient Gender: M               HR:           69 bpm. Exam Location:  Inpatient Procedure: 2D Echo, Cardiac Doppler and Color Doppler Indications:    CAD Native Vessel I25.10  History:        Patient has prior history of Echocardiogram  examinations, most                 recent 05/26/2022. CAD, Pacemaker, CKD 3 and COPD; Risk                 Factors:Former Smoker, Dyslipidemia and Hypertension.  Sonographer:    Dondra Prader RVT RCS Referring Phys: [phone] MIHAI CROITORU IMPRESSIONS  1. Left ventricular ejection fraction, by estimation, is 55 to 60%. Left ventricular ejection fraction by 3D volume is 57 %. The left ventricle has normal function. The left ventricle has no regional wall motion abnormalities. There is mild asymmetric left ventricular hypertrophy of the basal-septal segment. Left ventricular diastolic parameters are consistent with Grade I diastolic dysfunction (impaired relaxation).  2. Right ventricular systolic function is normal. The right ventricular size is normal.  3. The mitral valve is abnormal. Mild mitral valve regurgitation.  4. The aortic valve is tricuspid. Aortic valve regurgitation is not visualized. Comparison(s): Changes from prior study are noted. 05/26/2022: LVEF 40-45%, pardoxical septal motion, apical hypokinesis. FINDINGS  Left Ventricle: Left ventricular ejection fraction, by estimation, is 55 to 60%. Left ventricular ejection fraction by 3D volume is 57 %. The left ventricle has normal function. The left ventricle has no regional wall motion abnormalities. The left ventricular internal cavity size was normal in size. There is mild asymmetric left ventricular hypertrophy of the basal-septal segment. Abnormal (paradoxical) septal motion consistent with post-operative status. Left ventricular diastolic parameters are consistent with Grade I diastolic dysfunction (impaired relaxation). Indeterminate filling pressures. Right Ventricle: The right ventricular size is normal. No increase in right ventricular wall thickness. Right ventricular systolic function is normal. Left Atrium: Left atrial size was normal in size. Right Atrium: Right atrial size was normal in size. Pericardium: There is no evidence of pericardial  effusion. Mitral Valve: The mitral valve is abnormal. There is mild thickening of the anterior mitral valve leaflet(s). Mild mitral valve regurgitation. Tricuspid Valve: The tricuspid valve is grossly normal. Tricuspid valve regurgitation is mild. Aortic Valve: The aortic valve is tricuspid. Aortic valve regurgitation is not visualized. Aortic valve mean gradient measures 2.0 mmHg. Aortic valve peak gradient measures 3.7 mmHg. Aortic valve area, by VTI measures 2.46 cm. Pulmonic Valve: The pulmonic valve was not well visualized. Pulmonic valve regurgitation is not visualized. Aorta: The aortic root and ascending aorta are structurally normal, with no evidence of dilitation. Venous: The inferior vena cava was not well visualized. IAS/Shunts: No atrial level shunt detected by color flow Doppler.  LEFT VENTRICLE PLAX 2D LVIDd:  4.00 cm         Diastology LVIDs:         2.60 cm         LV e' medial:    5.96 cm/s LV PW:         1.00 cm         LV E/e' medial:  9.1 LV IVS:        1.30 cm         LV e' lateral:   8.94 cm/s LVOT diam:     1.90 cm         LV E/e' lateral: 6.1 LV SV:         48 LV SV Index:   23 LVOT Area:     2.84 cm        3D Volume EF                                LV 3D EF:    Left                                             ventricul                                             ar                                             ejection                                             fraction                                             by 3D                                             volume is                                             57 %.                                 3D Volume EF:                                3D EF:        57 %  LV EDV:       119 ml                                LV ESV:       52 ml                                LV SV:        68 ml RIGHT VENTRICLE RV S prime:     11.20 cm/s TAPSE (M-mode): 2.1 cm LEFT ATRIUM             Index        RIGHT ATRIUM            Index LA diam:        3.30 cm 1.58 cm/m   RA Area:     15.50 cm LA Vol (A2C):   42.0 ml 20.15 ml/m  RA Volume:   36.30 ml  17.41 ml/m LA Vol (A4C):   46.2 ml 22.16 ml/m LA Biplane Vol: 46.4 ml 22.26 ml/m  AORTIC VALVE                    PULMONIC VALVE AV Area (Vmax):    2.25 cm     PV Vmax:          0.87 m/s AV Area (Vmean):   2.17 cm     PV Peak grad:     3.1 mmHg AV Area (VTI):     2.46 cm     PR End Diast Vel: 4.24 msec AV Vmax:           96.40 cm/s AV Vmean:          65.000 cm/s AV VTI:            0.196 m AV Peak Grad:      3.7 mmHg AV Mean Grad:      2.0 mmHg LVOT Vmax:         76.40 cm/s LVOT Vmean:        49.700 cm/s LVOT VTI:          0.170 m LVOT/AV VTI ratio: 0.87  AORTA Ao Root diam: 3.50 cm Ao Asc diam:  3.20 cm MITRAL VALVE               TRICUSPID VALVE MV Area (PHT): 2.68 cm    TR Peak grad:   32.9 mmHg MV Decel Time: 283 msec    TR Vmax:        287.00 cm/s MV E velocity: 54.50 cm/s MV A velocity: 62.90 cm/s  SHUNTS MV E/A ratio:  0.87        Systemic VTI:  0.17 m                            Systemic Diam: 1.90 cm Zoila Shutter MD Electronically signed by Zoila Shutter MD Signature Date/Time: 10/02/2022/2:19:49 PM    Final      Scheduled Meds:  amLODipine  5 mg Oral Daily   aspirin  81 mg Oral Daily   buPROPion  150 mg Oral BID   cloNIDine  0.2 mg Oral TID   clopidogrel  75 mg Oral Daily   ezetimibe  10 mg Oral Daily   fluticasone furoate-vilanterol  1 puff Inhalation Daily   hydrALAZINE  100 mg Oral TID   isosorbide dinitrate  30 mg Oral TID   metoprolol succinate  100 mg Oral BID   montelukast  10 mg Oral QHS   paliperidone  6 mg Oral QHS   pantoprazole  40 mg Oral BID   sodium chloride flush  3 mL Intravenous Q12H   umeclidinium bromide  1 puff Inhalation Daily   Continuous Infusions:  ampicillin-sulbactam (UNASYN) IV 3 g (10/03/22 0407)     LOS: 1 day    Time spent:    Zannie Cove, MD [facility]   10/03/2022, 11:07 AM

## 2022-10-04 DIAGNOSIS — I10 Essential (primary) hypertension: Secondary | ICD-10-CM | POA: Diagnosis not present

## 2022-10-04 DIAGNOSIS — K810 Acute cholecystitis: Secondary | ICD-10-CM | POA: Diagnosis not present

## 2022-10-04 DIAGNOSIS — N1832 Chronic kidney disease, stage 3b: Secondary | ICD-10-CM | POA: Diagnosis not present

## 2022-10-04 DIAGNOSIS — I441 Atrioventricular block, second degree: Secondary | ICD-10-CM | POA: Diagnosis not present

## 2022-10-04 LAB — COMPREHENSIVE METABOLIC PANEL
ALT: 12 U/L (ref 0–44)
AST: 16 U/L (ref 15–41)
Albumin: 3.3 g/dL — ABNORMAL LOW (ref 3.5–5.0)
Alkaline Phosphatase: 58 U/L (ref 38–126)
Anion gap: 7 (ref 5–15)
BUN: 22 mg/dL (ref 8–23)
CO2: 23 mmol/L (ref 22–32)
Calcium: 8.8 mg/dL — ABNORMAL LOW (ref 8.9–10.3)
Chloride: 102 mmol/L (ref 98–111)
Creatinine, Ser: 2.01 mg/dL — ABNORMAL HIGH (ref 0.61–1.24)
GFR, Estimated: 37 mL/min — ABNORMAL LOW (ref >60)
Glucose, Bld: 100 mg/dL — ABNORMAL HIGH (ref 70–99)
Potassium: 4.1 mmol/L (ref 3.5–5.1)
Sodium: 132 mmol/L — ABNORMAL LOW (ref 135–145)
Total Bilirubin: 0.9 mg/dL (ref 0.3–1.2)
Total Protein: 6.3 g/dL — ABNORMAL LOW (ref 6.5–8.1)

## 2022-10-04 LAB — CBC
HCT: 39 % (ref 39.0–52.0)
Hemoglobin: 12.8 g/dL — ABNORMAL LOW (ref 13.0–17.0)
MCH: 28.4 pg (ref 26.0–34.0)
MCHC: 32.8 g/dL (ref 30.0–36.0)
MCV: 86.7 fL (ref 80.0–100.0)
Platelets: 191 10*3/uL (ref 150–400)
RBC: 4.5 MIL/uL (ref 4.22–5.81)
RDW: 12.6 % (ref 11.5–15.5)
WBC: 7.6 10*3/uL (ref 4.0–10.5)
nRBC: 0 % (ref 0.0–0.2)

## 2022-10-04 NOTE — Progress Notes (Signed)
Progress Note   Patient: Peter Marsh ZOX:[id number] DOB: 11-22-1960 DOA: 10/01/2022     2 DOS: the patient was seen and examined on 10/04/2022   Brief hospital course: Peter Marsh was hospitalized with the working diagnosis of acute cholecystitis.   62 yo male with the past medical history of heart failure, atrial fibrillation, coronary artery disease, CKD, heart block sp pacemaker implantation, abdominal aortic aneurysm. hypertension, and schizophrenia who presented with abdominal pain. Reported one week of intermittent epigastric/ umbilical pain. On the day of hospitalization pain woke him up from his sleep at 3 am, prompting him to come to the ED. On his initial physical examination his blood pressure was 201/115, HR 65, RR 13 and 02 saturation 100%, lungs with no wheezing or rales, heart with S1 and S2 present and rhythmic, abdomen tender to palpation with no rebound or guarding, no lower extremity edema.  CT chest abdomen and pelvis with no evidence of acute intramural hematoma or dissection in the thoracoabdominal aorta.  11 x 8 mm penetrating ulcer or ulcerated plaque along the under surface of the transverse aorta. 3,9 x 3,7 cm infrarenal abdominal aortic aneurysm.  Gallbladder wall ill defined, raising the question of pericholecystic edema. 6 mm gallstone evident.  Large central sinus cyst left kidney.  Bilateral non obstructing nephrolithiasis.  5.5 exophytic low density lesion upper pole left kidney has increased from 4,7 cm compared to 2022. Follow up CT with an without contrast recommended to exclude hypo- enhancing neoplasm.  Chronic pancreatitis.  Emphysema.    IR consulted 5/27, felt gallbladder was decompressed and Colon overlaying gallbladder with no window for [facility] catheter placement Slowly improving on antibiotics, supportive care  05/29 advance diet to soft.   Assessment and Plan: * Acute cholecystitis Patient not able to stop antiplatelet therapy in the setting of  recent DES placement to LAD in January 2024.  IR not able to insert percutaneous cholecystostomy drain due to poor windows.   Wbc 7.6, he has been afebrile, no nausea or vomiting, no significant abdominal pain.   Plan to continue conservative medical therapy with IV antibiotic therapy, Unasyn IV. Pain control with acetaminophen.  Advance diet slowly, he was able to tolerate full liquids yesterday, will do soft diet today.  Follow up with surgery recommendations.   Essential hypertension Continue blood pressure control with amlodipine, isosorbide, hydralazine and metoprolol.   Mobitz type 2 second degree AV block Patient SP pacemaker implantation.   Chronic kidney disease, stage 3b ([name]) Hyponatremia,   Renal function with serum cr at 2.0 with K at 4,1 and serum bicarbonate at 23.  Na 132.   Plan to continue blood pressure control. Hold on diuretic therapy for now.  Continue to advance diet.  Follow up renal function and electrolytes in am.   Aneurysm of infrarenal abdominal aorta ([name]) Continue close blood pressure control Follow up as outpatient.   GERD (gastroesophageal reflux disease) Continue antiacid therapy with pantoprazole and sucralfate.   Paroxysmal atrial flutter ([name]) Patient on a atrial paced rhythm,  Continue metoprolol for rate control.  Currently off anticoagulation.   Asthma-COPD overlap syndrome No signs of exacerbation, continue bronchodilator therapy.   Coronary artery disease involving native coronary artery of native heart without angina pectoris Sp recent LAD stent (DES), continue dual antiplatelet therapy with aspirin and clopidogrel for at least 6 months.   Hyperlipidemia Continue with ezetimibe.   Chronic diastolic CHF (congestive heart failure) ([name]) Echocardiogram with preserved LV systolic function EF 55 to 60%, mild  asymmetric hypertrophy at the basal segment. RV with preserved systolic function, no significant valvular disease.    Continue blood pressure control. Hold on diuretic therapy.   Schizophrenia ([name]) Depression,   Continue with bupropion, and paliperidone.         Subjective: patient is feeling well, no chest pain, no dyspnea, no abdominal pain, no nausea or vomiting, tolerating well liquid diet.   Physical Exam: Vitals:   10/03/22 1216 10/03/22 2023 10/04/22 0520 10/04/22 0742  BP: (!) 152/92 138/87 (!) 141/90 (!) 152/88  Pulse: 63 65 67 64  Resp: 16 16 16 18   Temp: 98.2 F (36.8 C) 97.9 F (36.6 C) 97.6 F (36.4 C) 97.6 F (36.4 C)  TempSrc: Oral Oral Oral Oral  SpO2:  97% 95%   Weight:   82.5 kg   Height:       Neurology awake and alert ENT with mild pallor Cardiovascular with S1 and S2 present and regular with no gallops, rubs or murmurs Respiratory with no rales or wheezing no rhonchi Abdomen with mild distention, with no tenderness or rebound, no guarding. No lower extremity edema.  Data Reviewed:    Family Communication: no family at the bedside   Disposition: Status is: Inpatient Remains inpatient appropriate because: IV antibiotics, non surgical treatment to acute cholecystitis.   Planned Discharge Destination: Home    Author: Coralie Keens, MD 10/04/2022 9:44 AM  For on call review [email or web address].

## 2022-10-04 NOTE — Evaluation (Signed)
Physical Therapy Evaluation Patient Details Name: Peter Marsh MRN: [id number] DOB: December 10, 1960 Today's Date: 10/04/2022  History of Present Illness  61/M w systolic CHF, EF 40-98%, atrial flutter, second-degree AV block type II, sinus node dysfunction, status post pacemaker, CAD, chronic pancreatitis, asthma-COPD overlap, schizophrenia, SBO, hypertension, hyperlipidemia, PUD, GERD, hepatitis C, CKD. Pt had NSTEMI in January, with resultant placement of drug-eluting stent to LAD. Pt admitted 5/26 with acute cholecystitis. CT also noted thoracic aortic aneurysm and penetrating aortic ulcer.  Clinical Impression  Pt presents with admitting diagnosis above. Pt was able to ambulate in hallway with no AD and navigate stairs independently. Pt presents at or near baseline mobility. Pt has no further acute PT needs and will be signing off. Re consult PT if mobility status changes.        Recommendations for follow up therapy are one component of a multi-disciplinary discharge planning process, led by the attending physician.  Recommendations may be updated based on patient status, additional functional criteria and insurance authorization.  Follow Up Recommendations       Assistance Recommended at Discharge PRN  Patient can return home with the following  Assist for transportation    Equipment Recommendations None recommended by PT  Recommendations for Other Services       Functional Status Assessment Patient has had a recent decline in their functional status and demonstrates the ability to make significant improvements in function in a reasonable and predictable amount of time.     Precautions / Restrictions Precautions Precautions: Fall;ICD/Pacemaker Restrictions Weight Bearing Restrictions: No      Mobility  Bed Mobility Overal bed mobility: Independent                  Transfers Overall transfer level: Independent                 General transfer comment: no  equipment utilized, no loss of balance noted    Ambulation/Gait Ambulation/Gait assistance: Independent Gait Distance (Feet): 600 Feet Assistive device: None Gait Pattern/deviations: WFL(Within Functional Limits)   Gait velocity interpretation: >4.37 ft/sec, indicative of normal walking speed   General Gait Details: no LOB noted.  Stairs Stairs: Yes Stairs assistance: Independent Stair Management: One rail Left, Alternating pattern, Forwards Number of Stairs: 10 General stair comments: no LOB noted  Wheelchair Mobility    Modified Rankin (Stroke Patients Only)       Balance Overall balance assessment: Independent                                           Pertinent Vitals/Pain Pain Assessment Pain Assessment: No/denies pain    Home Living Family/patient expects to be discharged to:: Private residence Living Arrangements: Spouse/significant other Available Help at Discharge: Family;Available 24 hours/day Type of Home: Apartment Home Access: Level entry       Home Layout: One level Home Equipment: Grab bars - tub/shower;Shower seat;Grab bars - Curator (2 wheels)      Prior Function Prior Level of Function : Independent/Modified Independent             Mobility Comments: independent without AD ADLs Comments: independent, still driving pt reports he was doing all the cooking and cleaning at home     Hand Dominance   Dominant Hand: Right    Extremity/Trunk Assessment   Upper Extremity Assessment Upper Extremity Assessment: Overall WFL for tasks assessed  Lower Extremity Assessment Lower Extremity Assessment: Overall WFL for tasks assessed    Cervical / Trunk Assessment Cervical / Trunk Assessment: Normal  Communication   Communication: No difficulties  Cognition Arousal/Alertness: Awake/alert Behavior During Therapy: WFL for tasks assessed/performed Overall Cognitive Status: Within Functional Limits for tasks  assessed                                          General Comments General comments (skin integrity, edema, etc.): VSS on RA    Exercises     Assessment/Plan    PT Assessment Patient does not need any further PT services  PT Problem List         PT Treatment Interventions      PT Goals (Current goals can be found in the Care Plan section)       Frequency       Co-evaluation               AM-PAC PT "6 Clicks" Mobility  Outcome Measure Help needed turning from your back to your side while in a flat bed without using bedrails?: None Help needed moving from lying on your back to sitting on the side of a flat bed without using bedrails?: None Help needed moving to and from a bed to a chair (including a wheelchair)?: None Help needed standing up from a chair using your arms (e.g., wheelchair or bedside chair)?: None Help needed to walk in hospital room?: None Help needed climbing 3-5 steps with a railing? : None 6 Click Score: 24    End of Session   Activity Tolerance: Patient tolerated treatment well Patient left: in bed;with call bell/phone within reach Nurse Communication: Mobility status PT Visit Diagnosis: Other abnormalities of gait and mobility (R26.89)    Time: 1352-1400 PT Time Calculation (min) (ACUTE ONLY): 8 min   Charges:   PT Evaluation $PT Eval Low Complexity: 1 Low          Basilia Stuckert B, PT, DPT Acute Rehab Services [phone]   Gladys Damme 10/04/2022, 3:01 PM

## 2022-10-04 NOTE — Assessment & Plan Note (Addendum)
Continue blood pressure control with isosorbide, hydralazine and metoprolol.  Amlodipine was added for better blood pressure control.  Follow up as outpatient.

## 2022-10-04 NOTE — Assessment & Plan Note (Signed)
Continue antiacid therapy with pantoprazole and sucralfate.

## 2022-10-04 NOTE — Progress Notes (Signed)
Progress Note     Subjective: No abdominal pain, nausea or vomiting. Tolerating diet and having bowel function.   Objective: Vital signs in last 24 hours: Temp:  [97.6 F (36.4 C)-98.2 F (36.8 C)] 97.6 F (36.4 C) (05/29 0742) Pulse Rate:  [63-67] 64 (05/29 0742) Resp:  [16-18] 18 (05/29 0742) BP: (138-152)/(87-92) 152/88 (05/29 0742) SpO2:  [95 %-97 %] 95 % (05/29 0520) Weight:  [82.5 kg] 82.5 kg (05/29 0520) Last BM Date : 09/27/22  Intake/Output from previous day: 05/28 0701 - 05/29 0700 In: 723.1 [P.O.:240; IV Piggyback:483.1] Out: 350 [Urine:350] Intake/Output this shift: Total I/O In: -  Out: 600 [Urine:600]  PE: General: pleasant, WD, elderly male who is laying in bed in NAD HEENT: sclera anicteric  Heart: regular, rate, and rhythm.   Lungs: Respiratory effort nonlabored Abd: soft, NT, ND, +BS, no masses, hernias, or organomegaly Psych: A&Ox3 with an appropriate affect.    Lab Results:  Recent Labs    10/02/22 0243 10/04/22 0124  WBC 8.3 7.6  HGB 12.9* 12.8*  HCT 39.9 39.0  PLT 186 191   BMET Recent Labs    10/02/22 0243 10/04/22 0124  NA 133* 132*  K 5.1 4.1  CL 107 102  CO2 19* 23  GLUCOSE 103* 100*  BUN 21 22  CREATININE 1.77* 2.01*  CALCIUM 8.1* 8.8*   PT/INR No results for input(s): "LABPROT", "INR" in the last 72 hours. CMP     Component Value Date/Time   NA 132 (L) 10/04/2022 0124   NA 136 11/02/2020 1356   K 4.1 10/04/2022 0124   CL 102 10/04/2022 0124   CO2 23 10/04/2022 0124   GLUCOSE 100 (H) 10/04/2022 0124   BUN 22 10/04/2022 0124   BUN 18 11/02/2020 1356   CREATININE 2.01 (H) 10/04/2022 0124   CALCIUM 8.8 (L) 10/04/2022 0124   PROT 6.3 (L) 10/04/2022 0124   PROT 6.1 11/02/2020 1356   ALBUMIN 3.3 (L) 10/04/2022 0124   ALBUMIN 3.5 (L) 11/02/2020 1356   AST 16 10/04/2022 0124   ALT 12 10/04/2022 0124   ALKPHOS 58 10/04/2022 0124   BILITOT 0.9 10/04/2022 0124   BILITOT 0.3 11/02/2020 1356   GFRNONAA 37 (L)  10/04/2022 0124   GFRAA 40 (L) 12/08/2019 0832   Lipase     Component Value Date/Time   LIPASE 32 10/01/2022 0420       Studies/Results: US Abdomen Limited RUQ (LIVER/GB)  Result Date: 10/03/2022 CLINICAL DATA:  Calculus cholecystitis EXAM: ULTRASOUND ABDOMEN LIMITED RIGHT UPPER QUADRANT COMPARISON:  10/01/2022 FINDINGS: Gallbladder: Gallstones: Present Sludge: None Gallbladder Wall: Thickened measuring up to 5 mm. Pericholecystic fluid: None Sonographic Murphy's Sign: Negative per technologist Non-mobile, echogenic structure measuring 5 mm, appears adherent to the gallbladder wall most likely represents a polyp or tumefactive sludge. Common bile duct: Diameter: 3 mm Liver: Visualization of the liver is limited due to shadowing from adjacent air containing bowel loops. Parenchymal echogenicity: Within normal limits Contours: Normal Lesions: None Portal vein: Patent.  Hepatopetal flow Other: Incidental note of simple right renal cysts measuring 6.8 cm. IMPRESSION: Cholelithiasis with gallbladder wall thickening remains suspicious for cholecystitis. Electronically Signed   By: Acquanetta Belling M.D.   On: 10/03/2022 10:58   ECHOCARDIOGRAM COMPLETE  Result Date: 10/02/2022    ECHOCARDIOGRAM REPORT   Patient Name:   Peter Marsh Date of Exam: 10/02/2022 Medical Rec #:  [id number]       Height:       73.0 in Accession #:  [id number]      Weight:       185.7 lb Date of Birth:  [birth year]       BSA:          2.085 m Patient Age:    61 years        BP:           131/93 mmHg Patient Gender: M               HR:           69 bpm. Exam Location:  Inpatient Procedure: 2D Echo, Cardiac Doppler and Color Doppler Indications:    CAD Native Vessel I25.10  History:        Patient has prior history of Echocardiogram examinations, most                 recent 05/26/2022. CAD, Pacemaker, CKD 3 and COPD; Risk                 Factors:Former Smoker, Dyslipidemia and Hypertension.  Sonographer:    Dondra Prader RVT RCS  Referring Phys: [phone] MIHAI CROITORU IMPRESSIONS  1. Left ventricular ejection fraction, by estimation, is 55 to 60%. Left ventricular ejection fraction by 3D volume is 57 %. The left ventricle has normal function. The left ventricle has no regional wall motion abnormalities. There is mild asymmetric left ventricular hypertrophy of the basal-septal segment. Left ventricular diastolic parameters are consistent with Grade I diastolic dysfunction (impaired relaxation).  2. Right ventricular systolic function is normal. The right ventricular size is normal.  3. The mitral valve is abnormal. Mild mitral valve regurgitation.  4. The aortic valve is tricuspid. Aortic valve regurgitation is not visualized. Comparison(s): Changes from prior study are noted. 05/26/2022: LVEF 40-45%, pardoxical septal motion, apical hypokinesis. FINDINGS  Left Ventricle: Left ventricular ejection fraction, by estimation, is 55 to 60%. Left ventricular ejection fraction by 3D volume is 57 %. The left ventricle has normal function. The left ventricle has no regional wall motion abnormalities. The left ventricular internal cavity size was normal in size. There is mild asymmetric left ventricular hypertrophy of the basal-septal segment. Abnormal (paradoxical) septal motion consistent with post-operative status. Left ventricular diastolic parameters are consistent with Grade I diastolic dysfunction (impaired relaxation). Indeterminate filling pressures. Right Ventricle: The right ventricular size is normal. No increase in right ventricular wall thickness. Right ventricular systolic function is normal. Left Atrium: Left atrial size was normal in size. Right Atrium: Right atrial size was normal in size. Pericardium: There is no evidence of pericardial effusion. Mitral Valve: The mitral valve is abnormal. There is mild thickening of the anterior mitral valve leaflet(s). Mild mitral valve regurgitation. Tricuspid Valve: The tricuspid valve is grossly  normal. Tricuspid valve regurgitation is mild. Aortic Valve: The aortic valve is tricuspid. Aortic valve regurgitation is not visualized. Aortic valve mean gradient measures 2.0 mmHg. Aortic valve peak gradient measures 3.7 mmHg. Aortic valve area, by VTI measures 2.46 cm. Pulmonic Valve: The pulmonic valve was not well visualized. Pulmonic valve regurgitation is not visualized. Aorta: The aortic root and ascending aorta are structurally normal, with no evidence of dilitation. Venous: The inferior vena cava was not well visualized. IAS/Shunts: No atrial level shunt detected by color flow Doppler.  LEFT VENTRICLE PLAX 2D LVIDd:         4.00 cm         Diastology LVIDs:         2.60 cm  LV e' medial:    5.96 cm/s LV PW:         1.00 cm         LV E/e' medial:  9.1 LV IVS:        1.30 cm         LV e' lateral:   8.94 cm/s LVOT diam:     1.90 cm         LV E/e' lateral: 6.1 LV SV:         48 LV SV Index:   23 LVOT Area:     2.84 cm        3D Volume EF                                LV 3D EF:    Left                                             ventricul                                             ar                                             ejection                                             fraction                                             by 3D                                             volume is                                             57 %.                                 3D Volume EF:                                3D EF:        57 %                                LV EDV:       119 ml  LV ESV:       52 ml                                LV SV:        68 ml RIGHT VENTRICLE RV S prime:     11.20 cm/s TAPSE (M-mode): 2.1 cm LEFT ATRIUM             Index        RIGHT ATRIUM           Index LA diam:        3.30 cm 1.58 cm/m   RA Area:     15.50 cm LA Vol (A2C):   42.0 ml 20.15 ml/m  RA Volume:   36.30 ml  17.41 ml/m LA Vol (A4C):   46.2 ml 22.16 ml/m LA Biplane Vol:  46.4 ml 22.26 ml/m  AORTIC VALVE                    PULMONIC VALVE AV Area (Vmax):    2.25 cm     PV Vmax:          0.87 m/s AV Area (Vmean):   2.17 cm     PV Peak grad:     3.1 mmHg AV Area (VTI):     2.46 cm     PR End Diast Vel: 4.24 msec AV Vmax:           96.40 cm/s AV Vmean:          65.000 cm/s AV VTI:            0.196 m AV Peak Grad:      3.7 mmHg AV Mean Grad:      2.0 mmHg LVOT Vmax:         76.40 cm/s LVOT Vmean:        49.700 cm/s LVOT VTI:          0.170 m LVOT/AV VTI ratio: 0.87  AORTA Ao Root diam: 3.50 cm Ao Asc diam:  3.20 cm MITRAL VALVE               TRICUSPID VALVE MV Area (PHT): 2.68 cm    TR Peak grad:   32.9 mmHg MV Decel Time: 283 msec    TR Vmax:        287.00 cm/s MV E velocity: 54.50 cm/s MV A velocity: 62.90 cm/s  SHUNTS MV E/A ratio:  0.87        Systemic VTI:  0.17 m                            Systemic Diam: 1.90 cm Zoila Shutter MD Electronically signed by Zoila Shutter MD Signature Date/Time: 10/02/2022/2:19:49 PM    Final     Anti-infectives: Anti-infectives (From admission, onward)    Start     Dose/Rate Route Frequency Ordered Stop   10/02/22 1130  Ampicillin-Sulbactam (UNASYN) 3 g in sodium chloride 0.9 % 100 mL IVPB        3 g 200 mL/hr over 30 Minutes Intravenous Every 8 hours 10/02/22 1038          Assessment/Plan  Acute cholecystitis  - too high risk for surgery in setting of recent stents and not being able to come off plavix  - gallbladder is small and contracted and not amenable to perc drainage - has  responded well to antibiotics and tolerating a diet - can see someone in the office to discuss elective cholecystectomy in the future once cleared from cardiology standpoint - no other recommendations from a surgery standpoint at this time, ok to DC on PO abx   FEN: low fat diet  VTE: plavix ID: [email or web address]  LOS: 2 days   I reviewed Consultant cardiology notes, hospitalist notes, last 24 h vitals and pain scores, last 48 h intake and output, and last  24 h labs and trends.    Juliet Rude, [facility] 10/04/2022, 10:43 AM Please see Amion for pager number during day hours 7:00am-4:30pm

## 2022-10-04 NOTE — Assessment & Plan Note (Addendum)
Patient not able to stop antiplatelet therapy in the setting of recent DES placement to LAD in January 2024.  IR not able to insert percutaneous cholecystostomy drain due to poor windows.   Surgery was consulted and it was recommended to treat patient medically with supportive medical care and IV antibiotic therapy.   Patient was placed on Unasyn IV with good toleration. No leukocytosis, fever or further abdominal pain, no nausea or vomiting.  His diet was advanced slowly with good toleration.   Patient will continue with oral antibiotic therapy to complete 10 days and have outpatient follow up with surgery. Possible cholecystectomy in 6 months, when antiplatelet therapy can safely place on hold for surgical intervention.

## 2022-10-04 NOTE — Plan of Care (Signed)

## 2022-10-04 NOTE — TOC Initial Note (Signed)
Transition of Care [facility]) - Initial/Assessment Note    Patient Details  Name: Peter Marsh MRN: [id number] Date of Birth: 11/22/1960  [facility] CM/SW Contact:    Gala Lewandowsky, RN Phone Number: 10/04/2022, 4:29 PM  Clinical Narrative: Patient presented for abdominal pain. PTA patient was independent from home with spouse. Patient reports he has DME rolling walker in the home. No home needs identified at the time of the visit. Case Manager will continue to follow for transition of care needs as the patient progresses.                  Expected Discharge Plan: Home/Self Care Barriers to Discharge: No Barriers Identified   Patient Goals and CMS Choice Patient states their goals for this hospitalization and ongoing recovery are:: to return home   Choice offered to / list presented to : NA     Expected Discharge Plan and Services In-house Referral: NA Discharge Planning Services: CM Consult Post Acute Care Choice: NA Living arrangements for the past 2 months: Apartment                   DME Agency: NA       HH Arranged: NA   Prior Living Arrangements/Services Living arrangements for the past 2 months: Apartment Lives with:: Spouse Patient language and need for interpreter reviewed:: Yes Do you feel safe going back to the place where you live?: Yes      Need for Family Participation in Patient Care: Yes (Comment) Care giver support system in place?: Yes (comment) Current home services: DME (rolling walker) Criminal Activity/Legal Involvement Pertinent to Current Situation/Hospitalization: No - Comment as needed  Activities of Daily Living Home Assistive Devices/Equipment: None ADL Screening (condition at time of admission) Patient's cognitive ability adequate to safely complete daily activities?: Yes Is the patient deaf or have difficulty hearing?: No Does the patient have difficulty seeing, even when wearing glasses/contacts?: No Does the  patient have difficulty concentrating, remembering, or making decisions?: No Patient able to express need for assistance with ADLs?: Yes Does the patient have difficulty dressing or bathing?: No Independently performs ADLs?: Yes (appropriate for developmental age) Does the patient have difficulty walking or climbing stairs?: No Weakness of Legs: None Weakness of Arms/Hands: None  Permission Sought/Granted Permission sought to share information with : Family Supports, Case Manager    Emotional Assessment Appearance:: Appears stated age Attitude/Demeanor/Rapport: Engaged Affect (typically observed): Appropriate Orientation: : Oriented to Self, Oriented to Place, Oriented to  Time, Oriented to Situation Alcohol / Substance Use: Not Applicable Psych Involvement: No (comment)  Admission diagnosis:  Acute cholecystitis [K81.0] Cholecystitis [K81.9] Infrarenal abdominal aortic aneurysm (AAA) without rupture ([name]) [I71.43] Patient Active Problem List   Diagnosis Date Noted   Acute cholecystitis 10/01/2022   Chronic diastolic CHF (congestive heart failure) ([name]) 10/01/2022   Coronary artery disease involving native coronary artery of native heart without angina pectoris 06/27/2022   Myalgia due to statin 06/27/2022   NSTEMI (non-ST elevated myocardial infarction) ([name]) 05/26/2022   Hypertensive urgency 05/26/2022   Paroxysmal atrial flutter ([name]) 05/26/2022   Seasonal and perennial allergic rhinitis 12/09/2020   SBO (small bowel obstruction) ([name]) 10/22/2020   Ileus ([name]) 10/21/2020   Aneurysm of infrarenal abdominal aorta ([name]) 10/21/2020   Allergic rhinitis due to pollen 09/22/2020   Asthma-COPD overlap syndrome 09/22/2020   Chronic pancreatitis ([name]) 09/22/2020   Gastric ulcer, unspecified as acute or chronic, without hemorrhage or perforation 09/22/2020   Hyperglycemia  09/22/2020   Left bundle branch block 09/22/2020   Schizoaffective disorder, bipolar type ([name]) 09/22/2020    Testicular hypofunction 09/22/2020   Barrett's esophagus 09/22/2020   Psychotic disorder ([name]) 09/22/2020   Hepatitis C antibody positive in blood 09/22/2020   Hypertensive heart and chronic kidney disease with heart failure and stage 1 through stage 4 chronic kidney disease, or chronic kidney disease ([name]) 09/20/2020   Gait abnormality 10/22/2019   Vitamin B12 deficiency 10/22/2019   Idiopathic peripheral neuropathy 10/22/2019   Paresthesia 09/08/2019   Mobitz type 2 second degree AV block 04/06/2019   Sinus node dysfunction ([name]) 02/13/2019   GERD (gastroesophageal reflux disease) 11/19/2018   Essential hypertension 06/24/2018   Wide QRS ventricular tachycardia ([name]) 02/26/2017   Pacemaker: Medtronic Azure XT DR MRI [id number] PPM -  BUNDLE OF HIS pacing  02/26/2017   Chronic kidney disease, stage 3b ([name]) 09/16/2016   Schizophrenia ([name]) 09/16/2016   Herniated lumbar intervertebral disc 04/20/2011   Hyperlipidemia 04/20/2009   Gastroparesis 11/24/2008   History of tobacco abuse 08/14/2008   Low back pain 11/15/2007   PCP:  Tracey Harries, MD Pharmacy:   [facility] - [place], [place] - [address] [place] [address] Phone: [phone] Fax: [phone]  [id number] - [place], [place] - [address] AT [facility] [address] [facility] [address] [place] [address] Phone: [phone] Fax: [phone]  Social Determinants of Health (SDOH) Social History: SDOH Screenings   Food Insecurity: No Food Insecurity (10/01/2022)  Housing: Low Risk  (10/01/2022)  Transportation Needs: No Transportation Needs (10/01/2022)  Utilities: Not At Risk (10/01/2022)  Depression (PHQ2-9): Low Risk  (11/04/2020)  Tobacco Use: Medium Risk (10/01/2022)   Readmission Risk Interventions    10/04/2022    4:28 PM  Readmission Risk Prevention Plan  Transportation Screening Complete  Home Care Screening Complete   Medication Review (RN CM) Complete

## 2022-10-04 NOTE — Assessment & Plan Note (Signed)
No signs of exacerbation, continue bronchodilator therapy.  

## 2022-10-04 NOTE — Assessment & Plan Note (Addendum)
Echocardiogram with preserved LV systolic function EF 55 to 60%, mild asymmetric hypertrophy at the basal segment. RV with preserved systolic function, no significant valvular disease.   Continue blood pressure control. Hold on diuretic therapy.

## 2022-10-04 NOTE — Assessment & Plan Note (Signed)
Sp recent LAD stent (DES), continue dual antiplatelet therapy with aspirin and clopidogrel for at least 6 months.

## 2022-10-04 NOTE — Progress Notes (Addendum)
Rounding Note    Patient Name: Peter Marsh Date of Encounter: 10/04/2022  [facility] Cardiologist: Reatha Harps, MD   Subjective   Patient continues to recovery well. He reports no symptoms this morning and denies chest pain, shortness of breath, abdominal pain, nausea.  Inpatient Medications    Scheduled Meds:  amLODipine  5 mg Oral Daily   aspirin  81 mg Oral Daily   buPROPion  150 mg Oral BID   cloNIDine  0.2 mg Oral TID   clopidogrel  75 mg Oral Daily   ezetimibe  10 mg Oral Daily   fluticasone furoate-vilanterol  1 puff Inhalation Daily   hydrALAZINE  100 mg Oral TID   isosorbide dinitrate  30 mg Oral TID   metoprolol succinate  100 mg Oral BID   montelukast  10 mg Oral QHS   paliperidone  6 mg Oral QHS   pantoprazole  40 mg Oral BID   sodium chloride flush  3 mL Intravenous Q12H   umeclidinium bromide  1 puff Inhalation Daily   Continuous Infusions:  ampicillin-sulbactam (UNASYN) IV 200 mL/hr at 10/04/22 0605   PRN Meds: acetaminophen **OR** acetaminophen, albuterol, alum & mag hydroxide-simeth, ondansetron (ZOFRAN) IV, polyethylene glycol, sucralfate   Vital Signs    Vitals:   10/03/22 1216 10/03/22 2023 10/04/22 0520 10/04/22 0742  BP: (!) 152/92 138/87 (!) 141/90 (!) 152/88  Pulse: 63 65 67 64  Resp: 16 16 16 18   Temp: 98.2 F (36.8 C) 97.9 F (36.6 C) 97.6 F (36.4 C) 97.6 F (36.4 C)  TempSrc: Oral Oral Oral Oral  SpO2:  97% 95%   Weight:   82.5 kg   Height:        Intake/Output Summary (Last 24 hours) at 10/04/2022 0802 Last data filed at 10/04/2022 1610 Gross per 24 hour  Intake 723.14 ml  Output 350 ml  Net 373.14 ml      10/04/2022    5:20 AM 10/03/2022    5:35 AM 10/02/2022    5:50 AM  Last 3 Weights  Weight (lbs) 181 lb 14.4 oz 182 lb 6.4 oz 185 lb 11.2 oz  Weight (kg) 82.509 kg 82.736 kg 84.233 kg      Telemetry    Not on telemetry - Personally Reviewed  ECG    No new tracing  Physical Exam   GEN: No  acute distress.   Neck: No JVD Cardiac: RRR, no murmurs, rubs, or gallops.  Respiratory: Clear to auscultation bilaterally. GI: Soft, nontender, non-distended  MS: No edema; No deformity. Neuro:  Nonfocal  Psych: Normal affect   Labs    High Sensitivity Troponin:  No results for input(s): "TROPONINIHS" in the last 720 hours.   Chemistry Recent Labs  Lab 10/01/22 0420 10/02/22 0243 10/04/22 0124  NA 133* 133* 132*  K 4.6 5.1 4.1  CL 100 107 102  CO2 25 19* 23  GLUCOSE 104* 103* 100*  BUN 25* 21 22  CREATININE 1.94* 1.77* 2.01*  CALCIUM 8.7* 8.1* 8.8*  PROT 6.9 6.1* 6.3*  ALBUMIN 3.7 3.3* 3.3*  AST 23 20 16   ALT 17 15 12   ALKPHOS 66 64 58  BILITOT 0.8 0.6 0.9  GFRNONAA 38* 43* 37*  ANIONGAP 8 7 7     Lipids No results for input(s): "CHOL", "TRIG", "HDL", "LABVLDL", "LDLCALC", "CHOLHDL" in the last 168 hours.  Hematology Recent Labs  Lab 10/01/22 0420 10/02/22 0243 10/04/22 0124  WBC 8.2 8.3 7.6  RBC 4.78  4.51 4.50  HGB 13.4 12.9* 12.8*  HCT 42.5 39.9 39.0  MCV 88.9 88.5 86.7  MCH 28.0 28.6 28.4  MCHC 31.5 32.3 32.8  RDW 12.5 12.5 12.6  PLT 210 186 191   Thyroid No results for input(s): "TSH", "FREET4" in the last 168 hours.  BNPNo results for input(s): "BNP", "PROBNP" in the last 168 hours.  DDimer No results for input(s): "DDIMER" in the last 168 hours.   Radiology    US Abdomen Limited RUQ (LIVER/GB)  Result Date: 10/03/2022 CLINICAL DATA:  Calculus cholecystitis EXAM: ULTRASOUND ABDOMEN LIMITED RIGHT UPPER QUADRANT COMPARISON:  10/01/2022 FINDINGS: Gallbladder: Gallstones: Present Sludge: None Gallbladder Wall: Thickened measuring up to 5 mm. Pericholecystic fluid: None Sonographic Murphy's Sign: Negative per technologist Non-mobile, echogenic structure measuring 5 mm, appears adherent to the gallbladder wall most likely represents a polyp or tumefactive sludge. Common bile duct: Diameter: 3 mm Liver: Visualization of the liver is limited due to shadowing  from adjacent air containing bowel loops. Parenchymal echogenicity: Within normal limits Contours: Normal Lesions: None Portal vein: Patent.  Hepatopetal flow Other: Incidental note of simple right renal cysts measuring 6.8 cm. IMPRESSION: Cholelithiasis with gallbladder wall thickening remains suspicious for cholecystitis. Electronically Signed   By: Acquanetta Belling M.D.   On: 10/03/2022 10:58   ECHOCARDIOGRAM COMPLETE  Result Date: 10/02/2022    ECHOCARDIOGRAM REPORT   Patient Name:   Peter Marsh Date of Exam: 10/02/2022 Medical Rec #:  [id number]       Height:       73.0 in Accession #:    [id number]      Weight:       185.7 lb Date of Birth:  05-10-1960       BSA:          2.085 m Patient Age:    61 years        BP:           131/93 mmHg Patient Gender: M               HR:           69 bpm. Exam Location:  Inpatient Procedure: 2D Echo, Cardiac Doppler and Color Doppler Indications:    CAD Native Vessel I25.10  History:        Patient has prior history of Echocardiogram examinations, most                 recent 05/26/2022. CAD, Pacemaker, CKD 3 and COPD; Risk                 Factors:Former Smoker, Dyslipidemia and Hypertension.  Sonographer:    Dondra Prader RVT RCS Referring Phys: [phone] Rodman Recupero IMPRESSIONS  1. Left ventricular ejection fraction, by estimation, is 55 to 60%. Left ventricular ejection fraction by 3D volume is 57 %. The left ventricle has normal function. The left ventricle has no regional wall motion abnormalities. There is mild asymmetric left ventricular hypertrophy of the basal-septal segment. Left ventricular diastolic parameters are consistent with Grade I diastolic dysfunction (impaired relaxation).  2. Right ventricular systolic function is normal. The right ventricular size is normal.  3. The mitral valve is abnormal. Mild mitral valve regurgitation.  4. The aortic valve is tricuspid. Aortic valve regurgitation is not visualized. Comparison(s): Changes from prior study are noted.  05/26/2022: LVEF 40-45%, pardoxical septal motion, apical hypokinesis. FINDINGS  Left Ventricle: Left ventricular ejection fraction, by estimation, is 55 to 60%. Left ventricular ejection fraction by 3D  volume is 57 %. The left ventricle has normal function. The left ventricle has no regional wall motion abnormalities. The left ventricular internal cavity size was normal in size. There is mild asymmetric left ventricular hypertrophy of the basal-septal segment. Abnormal (paradoxical) septal motion consistent with post-operative status. Left ventricular diastolic parameters are consistent with Grade I diastolic dysfunction (impaired relaxation). Indeterminate filling pressures. Right Ventricle: The right ventricular size is normal. No increase in right ventricular wall thickness. Right ventricular systolic function is normal. Left Atrium: Left atrial size was normal in size. Right Atrium: Right atrial size was normal in size. Pericardium: There is no evidence of pericardial effusion. Mitral Valve: The mitral valve is abnormal. There is mild thickening of the anterior mitral valve leaflet(s). Mild mitral valve regurgitation. Tricuspid Valve: The tricuspid valve is grossly normal. Tricuspid valve regurgitation is mild. Aortic Valve: The aortic valve is tricuspid. Aortic valve regurgitation is not visualized. Aortic valve mean gradient measures 2.0 mmHg. Aortic valve peak gradient measures 3.7 mmHg. Aortic valve area, by VTI measures 2.46 cm. Pulmonic Valve: The pulmonic valve was not well visualized. Pulmonic valve regurgitation is not visualized. Aorta: The aortic root and ascending aorta are structurally normal, with no evidence of dilitation. Venous: The inferior vena cava was not well visualized. IAS/Shunts: No atrial level shunt detected by color flow Doppler.  LEFT VENTRICLE PLAX 2D LVIDd:         4.00 cm         Diastology LVIDs:         2.60 cm         LV e' medial:    5.96 cm/s LV PW:         1.00 cm          LV E/e' medial:  9.1 LV IVS:        1.30 cm         LV e' lateral:   8.94 cm/s LVOT diam:     1.90 cm         LV E/e' lateral: 6.1 LV SV:         48 LV SV Index:   23 LVOT Area:     2.84 cm        3D Volume EF                                LV 3D EF:    Left                                             ventricul                                             ar                                             ejection  fraction                                             by 3D                                             volume is                                             57 %.                                 3D Volume EF:                                3D EF:        57 %                                LV EDV:       119 ml                                LV ESV:       52 ml                                LV SV:        68 ml RIGHT VENTRICLE RV S prime:     11.20 cm/s TAPSE (M-mode): 2.1 cm LEFT ATRIUM             Index        RIGHT ATRIUM           Index LA diam:        3.30 cm 1.58 cm/m   RA Area:     15.50 cm LA Vol (A2C):   42.0 ml 20.15 ml/m  RA Volume:   36.30 ml  17.41 ml/m LA Vol (A4C):   46.2 ml 22.16 ml/m LA Biplane Vol: 46.4 ml 22.26 ml/m  AORTIC VALVE                    PULMONIC VALVE AV Area (Vmax):    2.25 cm     PV Vmax:          0.87 m/s AV Area (Vmean):   2.17 cm     PV Peak grad:     3.1 mmHg AV Area (VTI):     2.46 cm     PR End Diast Vel: 4.24 msec AV Vmax:           96.40 cm/s AV Vmean:          65.000 cm/s AV VTI:            0.196 m AV Peak Grad:      3.7 mmHg AV Mean Grad:      2.0 mmHg LVOT Vmax:  76.40 cm/s LVOT Vmean:        49.700 cm/s LVOT VTI:          0.170 m LVOT/AV VTI ratio: 0.87  AORTA Ao Root diam: 3.50 cm Ao Asc diam:  3.20 cm MITRAL VALVE               TRICUSPID VALVE MV Area (PHT): 2.68 cm    TR Peak grad:   32.9 mmHg MV Decel Time: 283 msec    TR Vmax:        287.00 cm/s MV E velocity: 54.50 cm/s MV A velocity: 62.90 cm/s   SHUNTS MV E/A ratio:  0.87        Systemic VTI:  0.17 m                            Systemic Diam: 1.90 cm Zoila Shutter MD Electronically signed by Zoila Shutter MD Signature Date/Time: 10/02/2022/2:19:49 PM    Final     Cardiac Studies   TTE 05/26/2022  1. Left ventricular ejection fraction, by estimation, is 40 to 45%. The  left ventricle has mildly decreased function. The left ventricle  demonstrates regional wall motion abnormalities. Abnormal (paradoxical)  septal motion, consistent with left bundle  branch block. Apical hypokinesis.      There is severe asymmetric left ventricular hypertrophy of the  basal-septal segment. Left ventricular diastolic parameters are consistent  with Grade I diastolic dysfunction (impaired relaxation).   2. Right ventricular systolic function is normal. The right ventricular  size is normal.   3. The mitral valve is normal in structure. Trivial mitral valve  regurgitation. No evidence of mitral stenosis.   4. The aortic valve is tricuspid. Aortic valve regurgitation is not  visualized. No aortic stenosis is present.    LHC 05/26/2022    Prox RCA lesion is 25% stenosed.   Prox LAD lesion is 99% stenosed.  A drug-eluting stent was successfully placed using a SYNERGY XD [id number] postdilated to greater than 4 m in diameter and optimized with intravascular ultrasound..   Post intervention, there is a 0% residual stenosis.   LV end diastolic pressure is normal.   There is no aortic valve stenosis.   Successful PCI of the severe lesion in the proximal LAD.  Continue dual antiplatelet therapy for at least 12 months.  Continue aggressive secondary prevention.   Patient Profile     62 y.o. male with history of CAD, systolic CHF, CKD IV, AAA, hypertension, sick sinus syndrome s/p PPM, atrial flutter (not on OAC due to hx GI bleed), schizophrenia. Patient admitted with acute cholecystitis.   Assessment & Plan    CAD with recent PCI to proximal LAD Acute  cholecystitis   Patient with DES to prox LAD 05/26/22, on ASA/Plavix. Now admitted with acute cholecystitis and cardiology asked for input on holding Plavix. Given location of stent, would be risky to hold Plavix prior to completing minimum 6 months DAPT. Surgery has seen patient and feel that situation is not amenable to percutaneous drain management/no window with decompressed gallbladder.   Patient continues to respond well to ABX. As such, would favor allowing patient to complete 6 months of DAPT and consider surgery in second half of July when Plavix can more safely be held.    Paroxysmal atrial fibrillation SSS s/p PPM   Telemetry this admission has not shown evidence of afib. Has not been on OAC due to hx GI bleeding. Device  with proper function per ECG earlier this admission. Not currently on telemetry.    Hypertension   Patient remains hypertensive despite Clonidine 0.2mg  TID, Isordil 30mg  TID, Metoprolol Succinate 100mg  BID, Hydralazine 100mg  TID.   BP management challenging due to CKD.  Amlodipine 5mg  added yesterday with mild response. Given hx pedal edema with amlodipine in the past, would prefer to cap dose at 5mg . If patient remains hypertensive today, would consider switch from Metoprolol to Carvedilol. Otherwise continue above regimen.    Chronic HFmrEF, LVEF now preserved 55-60%   LVEF previously 40-45%, now 55-60% per TTE this admission.    GDMT limited by CKD IV. Continue Clonidine 0.2mg  TID, Isordil 30mg  TID, Metoprolol Succinate 100mg  BID, Hydralazine 100mg  TID.     Per primary team: CKD IV     For questions or updates, please contact [facility] Please consult [email or web address] for contact info under        Signed, Perlie Gold, PA-C  10/04/2022, 8:02 AM    I have seen and examined the patient along with Perlie Gold, PA-C.  I have reviewed the chart, notes and new data.  I agree with PA/NP's note.  Key new complaints: no CV complaints Key  examination changes: clinically euvolemic. BP a little high Key new findings / data: stable moderate renal dysfunction  PLAN: [facility] will sign off.   Medication Recommendations:   Amlodipine 5 mg daily (new) Aspirin 81 mg daily Clonidine 0.2 mg 3 times daily Clopidogrel 75 mg daily Ezetimibe 10 mg daily Hydralazine 100 mg 3 times daily (new dose Isosorbide dinitrate 30 mg 3 times daily Metoprolol succinate 100 mg twice daily Other recommendations (labs, testing, etc): Will to temporarily discontinue dual antiplatelet therapy for 5-7 days to undergo elective cholecystectomy after 11/24/2022 Follow up as an outpatient: Has an appointment with Edd Fabian, NP on 11/15/2022, preoperative surgical evaluation can be updated at that time.   Thurmon Fair, MD, [facility] [phone] 10/04/2022, 9:42 AM

## 2022-10-04 NOTE — Assessment & Plan Note (Signed)
Patient SP pacemaker implantation.

## 2022-10-04 NOTE — Assessment & Plan Note (Signed)
Continue with ezetimibe  

## 2022-10-04 NOTE — Hospital Course (Addendum)
Peter Marsh was hospitalized with the working diagnosis of acute cholecystitis.   62 yo male with the past medical history of heart failure, atrial fibrillation, coronary artery disease, CKD, heart block sp pacemaker implantation, abdominal aortic aneurysm. hypertension, and schizophrenia who presented with abdominal pain. Reported one week of intermittent epigastric/ umbilical pain. On the day of hospitalization pain woke him up from his sleep at 3 am, prompting him to come to the ED. On his initial physical examination his blood pressure was 201/115, HR 65, RR 13 and 02 saturation 100%, lungs with no wheezing or rales, heart with S1 and S2 present and rhythmic, abdomen tender to palpation with no rebound or guarding, no lower extremity edema.  Na 133, K 4,6 Cl 100 bicarbonate 25 glucose 104 bun 25 cr 1,94  Wbc 8,2 hgb 13,4 plt 210  Urine analysis SG 1,009, protein negative, leukocytes negative, hgb negative  Fecal occult blood positive   CT chest abdomen and pelvis with no evidence of acute intramural hematoma or dissection in the thoracoabdominal aorta.  11 x 8 mm penetrating ulcer or ulcerated plaque along the under surface of the transverse aorta. 3,9 x 3,7 cm infrarenal abdominal aortic aneurysm.  Gallbladder wall ill defined, raising the question of pericholecystic edema. 6 mm gallstone evident.  Large central sinus cyst left kidney.  Bilateral non obstructing nephrolithiasis.  5.5 exophytic low density lesion upper pole left kidney has increased from 4,7 cm compared to 2022. Follow up CT with an without contrast recommended to exclude hypo- enhancing neoplasm.  Chronic pancreatitis.  Emphysema.    EKG 61 bom, normal axis, left bundle branch block, atrial pacing and ventricular sensing with no significant ST segment or T wave changes.   Abdominal US with cholelithiasis with gallbladder wall thickening, suspicious for cholecystitis.   IR consulted 5/27, felt gallbladder was decompressed  and colon overlaying gallbladder with no window for [facility] catheter placement Slowly improving on antibiotics, supportive care  05/29 advance diet to soft.  05/30 patient is tolerating well soft diet, plan to continue with oral antibiotic therapy for a total of 10 days and have outpatient follow up. Eventual cholecystectomy as outpatient.

## 2022-10-04 NOTE — Assessment & Plan Note (Signed)
Depression,   Continue with bupropion, and paliperidone.

## 2022-10-04 NOTE — Evaluation (Signed)
Occupational Therapy Evaluation Patient Details Name: Peter Marsh MRN: [id number] DOB: 08-29-1960 Today's Date: 10/04/2022   History of Present Illness 61/M w systolic CHF, EF 40-98%, atrial flutter, second-degree AV block type II, sinus node dysfunction, status post pacemaker, CAD, chronic pancreatitis, asthma-COPD overlap, schizophrenia, SBO, hypertension, hyperlipidemia, PUD, GERD, hepatitis C, CKD. Pt had NSTEMI in January, with resultant placement of drug-eluting stent to LAD. Pt admitted 5/26 with acute cholecystitis. CT also noted thoracic aortic aneurysm and penetrating aortic ulcer.   Clinical Impression   Pt reports PTA, he was independent with ADL/IADL and functional mobility. Pt reports feeling at his baseline. He demonstrated ability to complete ADL/IADL at independent level. Pt ambulated independently. Patient evaluated by Occupational Therapy with no further acute OT needs identified. All education has been completed and the patient has no further questions. See below for any follow-up Occupational Therapy or equipment needs. OT to sign off. Thank you for referral.        Recommendations for follow up therapy are one component of a multi-disciplinary discharge planning process, led by the attending physician.  Recommendations may be updated based on patient status, additional functional criteria and insurance authorization.   Assistance Recommended at Discharge None  Patient can return home with the following      Functional Status Assessment  Patient has not had a recent decline in their functional status  Equipment Recommendations       Recommendations for Other Services       Precautions / Restrictions Precautions Precautions: Fall;ICD/Pacemaker Restrictions Weight Bearing Restrictions: No      Mobility Bed Mobility Overal bed mobility: Independent                  Transfers Overall transfer level: Independent                 General  transfer comment: no equipment utilized, no loss of balance noted      Balance Overall balance assessment: Independent                                         ADL either performed or assessed with clinical judgement   ADL Overall ADL's : Independent                                             Vision         Perception     Praxis      Pertinent Vitals/Pain Pain Assessment Pain Assessment: No/denies pain     Hand Dominance Right   Extremity/Trunk Assessment Upper Extremity Assessment Upper Extremity Assessment: Overall WFL for tasks assessed   Lower Extremity Assessment Lower Extremity Assessment: Overall WFL for tasks assessed   Cervical / Trunk Assessment Cervical / Trunk Assessment: Normal   Communication Communication Communication: No difficulties   Cognition Arousal/Alertness: Awake/alert Behavior During Therapy: WFL for tasks assessed/performed Overall Cognitive Status: Within Functional Limits for tasks assessed                                       General Comments  vss    Exercises     Shoulder Instructions      Home Living Family/patient  expects to be discharged to:: Private residence Living Arrangements: Spouse/significant other Available Help at Discharge: Family;Available 24 hours/day Type of Home: Apartment Home Access: Level entry     Home Layout: One level     Bathroom Shower/Tub: Chief Strategy Officer: Standard Bathroom Accessibility: Yes How Accessible: Accessible via walker Home Equipment: Grab bars - tub/shower;Shower seat;Grab bars - Curator (2 wheels)          Prior Functioning/Environment Prior Level of Function : Independent/Modified Independent             Mobility Comments: independent without AD ADLs Comments: independent, still driving pt reports he was doing all the cooking and cleaning at home        OT Problem List:         OT Treatment/Interventions:      OT Goals(Current goals can be found in the care plan section) Acute Rehab OT Goals Patient Stated Goal: to go home tomorrow OT Goal Formulation: With patient Time For Goal Achievement: 10/18/22 Potential to Achieve Goals: Good  OT Frequency:      Co-evaluation              AM-PAC OT "6 Clicks" Daily Activity     Outcome Measure Help from another person eating meals?: None Help from another person taking care of personal grooming?: None Help from another person toileting, which includes using toliet, bedpan, or urinal?: None Help from another person bathing (including washing, rinsing, drying)?: None Help from another person to put on and taking off regular upper body clothing?: None Help from another person to put on and taking off regular lower body clothing?: None 6 Click Score: 24   End of Session Nurse Communication: Mobility status  Activity Tolerance: Patient tolerated treatment well Patient left: in chair;with call bell/phone within reach;with family/visitor present  OT Visit Diagnosis: Other abnormalities of gait and mobility (R26.89)                Time: 1610-9604 OT Time Calculation (min): 14 min Charges:  OT General Charges $OT Visit: 1 Visit OT Evaluation $OT Eval Low Complexity: 1 Low  Kier Smead OTR/L Acute Rehabilitation Services Office: [phone]   Rebeca Alert 10/04/2022, 1:38 PM

## 2022-10-04 NOTE — Assessment & Plan Note (Signed)
Continue close blood pressure control Follow up as outpatient.

## 2022-10-04 NOTE — Assessment & Plan Note (Signed)
Patient on a atrial paced rhythm,  Continue metoprolol for rate control.  Currently off anticoagulation.

## 2022-10-04 NOTE — Assessment & Plan Note (Addendum)
Hyponatremia, base serum cr at 2,0   Renal function has remained stable, a the time of his discharge his serum cr is 2,14, with K at 4,5 and serum bicarbonate at 21. His po intake is improving.  Follow up renal function as outpatient.

## 2022-10-05 ENCOUNTER — Other Ambulatory Visit (HOSPITAL_COMMUNITY): Payer: Self-pay

## 2022-10-05 DIAGNOSIS — I441 Atrioventricular block, second degree: Secondary | ICD-10-CM | POA: Diagnosis not present

## 2022-10-05 DIAGNOSIS — N1832 Chronic kidney disease, stage 3b: Secondary | ICD-10-CM | POA: Diagnosis not present

## 2022-10-05 DIAGNOSIS — I10 Essential (primary) hypertension: Secondary | ICD-10-CM | POA: Diagnosis not present

## 2022-10-05 DIAGNOSIS — K810 Acute cholecystitis: Secondary | ICD-10-CM | POA: Diagnosis not present

## 2022-10-05 LAB — BASIC METABOLIC PANEL
Anion gap: 10 (ref 5–15)
BUN: 28 mg/dL — ABNORMAL HIGH (ref 8–23)
CO2: 21 mmol/L — ABNORMAL LOW (ref 22–32)
Calcium: 8.8 mg/dL — ABNORMAL LOW (ref 8.9–10.3)
Chloride: 103 mmol/L (ref 98–111)
Creatinine, Ser: 2.14 mg/dL — ABNORMAL HIGH (ref 0.61–1.24)
GFR, Estimated: 34 mL/min — ABNORMAL LOW (ref >60)
Glucose, Bld: 121 mg/dL — ABNORMAL HIGH (ref 70–99)
Potassium: 4.5 mmol/L (ref 3.5–5.1)
Sodium: 134 mmol/L — ABNORMAL LOW (ref 135–145)

## 2022-10-05 LAB — CBC
HCT: 38.6 % — ABNORMAL LOW (ref 39.0–52.0)
Hemoglobin: 12.9 g/dL — ABNORMAL LOW (ref 13.0–17.0)
MCH: 29.1 pg (ref 26.0–34.0)
MCHC: 33.4 g/dL (ref 30.0–36.0)
MCV: 86.9 fL (ref 80.0–100.0)
Platelets: 188 10*3/uL (ref 150–400)
RBC: 4.44 MIL/uL (ref 4.22–5.81)
RDW: 12.7 % (ref 11.5–15.5)
WBC: 6.6 10*3/uL (ref 4.0–10.5)
nRBC: 0 % (ref 0.0–0.2)

## 2022-10-05 MED ORDER — AMOXICILLIN-POT CLAVULANATE 875-125 MG PO TABS
1.0000 | ORAL_TABLET | Freq: Two times a day (BID) | ORAL | 0 refills | Status: AC
  Filled 2022-10-05: qty 14, 7d supply, fill #0

## 2022-10-05 MED ORDER — AMLODIPINE BESYLATE 5 MG PO TABS
5.0000 mg | ORAL_TABLET | Freq: Every day | ORAL | 0 refills | Status: DC
  Filled 2022-10-05: qty 30, 30d supply, fill #0

## 2022-10-05 MED ORDER — AMOXICILLIN-POT CLAVULANATE 875-125 MG PO TABS
1.0000 | ORAL_TABLET | Freq: Two times a day (BID) | ORAL | Status: DC
  Administered 2022-10-05: 1 via ORAL
  Filled 2022-10-05: qty 1

## 2022-10-05 NOTE — Care Management Important Message (Signed)
Important Message  Patient Details  Name: Peter Marsh MRN: [id number] Date of Birth: August 17, 1960   Medicare Important Message Given:  Yes     Sherilyn Banker 10/05/2022, 12:51 PM

## 2022-10-05 NOTE — Discharge Summary (Signed)
Physician Discharge Summary   Patient: Peter Marsh MRN: [id number] DOB: Sep 06, 1960  Admit date:     10/01/2022  Discharge date: 10/05/22  Discharge Physician: York Ram Deloria Brassfield   PCP: Tracey Harries, MD   Recommendations at discharge:    Patient will continue antibiotic therapy with Augmentin for 7 more days to complete a total of 10 days.  After 11/24/22 can discontinue temporarily dual antiplatelet therapy for 5 to 7 days for elective cholecystectomy.  Amlodipine added for better blood pressure control. Follow up with Dr Everlene Other in 7 to 10 days. Follow up renal function and electrolytes in 7 days.  Follow up with Cardiology 11/15/22.  Follow up with surgical team after 11/15/22   Discharge Diagnoses: Principal Problem:   Acute cholecystitis Active Problems:   Essential hypertension   Chronic kidney disease, stage 3b ([name])   Mobitz type 2 second degree AV block   GERD (gastroesophageal reflux disease)   Aneurysm of infrarenal abdominal aorta ([name])   Asthma-COPD overlap syndrome   Paroxysmal atrial flutter ([name])   Hyperlipidemia   Coronary artery disease involving native coronary artery of native heart without angina pectoris   Chronic diastolic CHF (congestive heart failure) ([name])   Schizophrenia ([name])  Resolved Problems:   * No resolved hospital problems. [facility] Course: [name] was hospitalized with the working diagnosis of acute cholecystitis.   62 yo male with the past medical history of heart failure, atrial fibrillation, coronary artery disease, CKD, heart block sp pacemaker implantation, abdominal aortic aneurysm. hypertension, and schizophrenia who presented with abdominal pain. Reported one week of intermittent epigastric/ umbilical pain. On the day of hospitalization pain woke him up from his sleep at 3 am, prompting him to come to the ED. On his initial physical examination his blood pressure was 201/115, HR 65, RR 13 and 02 saturation 100%, lungs  with no wheezing or rales, heart with S1 and S2 present and rhythmic, abdomen tender to palpation with no rebound or guarding, no lower extremity edema.  Na 133, K 4,6 Cl 100 bicarbonate 25 glucose 104 bun 25 cr 1,94  Wbc 8,2 hgb 13,4 plt 210  Urine analysis SG 1,009, protein negative, leukocytes negative, hgb negative  Fecal occult blood positive   CT chest abdomen and pelvis with no evidence of acute intramural hematoma or dissection in the thoracoabdominal aorta.  11 x 8 mm penetrating ulcer or ulcerated plaque along the under surface of the transverse aorta. 3,9 x 3,7 cm infrarenal abdominal aortic aneurysm.  Gallbladder wall ill defined, raising the question of pericholecystic edema. 6 mm gallstone evident.  Large central sinus cyst left kidney.  Bilateral non obstructing nephrolithiasis.  5.5 exophytic low density lesion upper pole left kidney has increased from 4,7 cm compared to 2022. Follow up CT with an without contrast recommended to exclude hypo- enhancing neoplasm.  Chronic pancreatitis.  Emphysema.    EKG 61 bom, normal axis, left bundle branch block, atrial pacing and ventricular sensing with no significant ST segment or T wave changes.   Abdominal US with cholelithiasis with gallbladder wall thickening, suspicious for cholecystitis.   IR consulted 5/27, felt gallbladder was decompressed and colon overlaying gallbladder with no window for [facility] catheter placement Slowly improving on antibiotics, supportive care  05/29 advance diet to soft.  05/30 patient is tolerating well soft diet, plan to continue with oral antibiotic therapy for a total of 10 days and have outpatient follow up. Eventual cholecystectomy as outpatient.   Assessment and Plan: *  Acute cholecystitis Patient not able to stop antiplatelet therapy in the setting of recent DES placement to LAD in January 2024.  IR not able to insert percutaneous cholecystostomy drain due to poor windows.   Surgery was  consulted and it was recommended to treat patient medically with supportive medical care and IV antibiotic therapy.   Patient was placed on Unasyn IV with good toleration. No leukocytosis, fever or further abdominal pain, no nausea or vomiting.  His diet was advanced slowly with good toleration.   Patient will continue with oral antibiotic therapy to complete 10 days and have outpatient follow up with surgery. Possible cholecystectomy in 6 months, when antiplatelet therapy can safely place on hold for surgical intervention.   Essential hypertension Continue blood pressure control with isosorbide, hydralazine and metoprolol.  Amlodipine was added for better blood pressure control.  Follow up as outpatient.   Mobitz type 2 second degree AV block Patient SP pacemaker implantation.   Chronic kidney disease, stage 3b ([name]) Hyponatremia, base serum cr at 2,0   Renal function has remained stable, a the time of his discharge his serum cr is 2,14, with K at 4,5 and serum bicarbonate at 21. His po intake is improving.  Follow up renal function as outpatient.   Aneurysm of infrarenal abdominal aorta ([name]) Continue close blood pressure control Follow up as outpatient.   GERD (gastroesophageal reflux disease) Continue antiacid therapy with pantoprazole and sucralfate.   Paroxysmal atrial flutter ([name]) Patient on a atrial paced rhythm,  Continue metoprolol for rate control.  Currently off anticoagulation.   Asthma-COPD overlap syndrome No signs of exacerbation, continue bronchodilator therapy.   Coronary artery disease involving native coronary artery of native heart without angina pectoris Sp recent LAD stent (DES), continue dual antiplatelet therapy with aspirin and clopidogrel for at least 6 months, post stent implantation.   Hyperlipidemia Continue with ezetimibe.   Chronic diastolic CHF (congestive heart failure) ([name]) Echocardiogram with preserved LV systolic function EF 55 to  60%, mild asymmetric hypertrophy at the basal segment. RV with preserved systolic function, no significant valvular disease.   Continue blood pressure control.  Patient not on diuretic therapy.  Follow up as outpatient.   Schizophrenia ([name]) Depression,   Continue with bupropion, and paliperidone.          Consultants: cardiology, interventional radiology and general surgery  Procedures performed: none   Disposition: Home Diet recommendation:  Discharge Diet Orders (From admission, onward)     Start     Ordered   10/05/22 0000  Diet - low sodium heart healthy        10/05/22 0855           Cardiac diet DISCHARGE MEDICATION: Allergies as of 10/05/2022       Reactions   Methylpyrrolidone Hives   froze the intestine   Niacin Itching, Nausea And Vomiting, Other (See Comments)   Flushing, itching, tingling    Nsaids Other (See Comments)   Other Reaction(s): CKD 4   Ace Inhibitors    Other reaction(s): CKD 4   Norvasc [amlodipine Besylate] Other (See Comments)   Swollen Feet   Other Other (See Comments)   Oxybutynin Chloride [oxybutynin Chloride Er] Other (See Comments)   froze the intestine   Solifenacin Succinate Other (See Comments)   Statins    Reports severe reaction but cannot remember exactly what it was    Vesicare [solifenacin Succinate] Other (See Comments)   Froze the intestine   Ciprofloxacin Rash, Other (See Comments)  Felt flushed   Oxybutynin Rash, Other (See Comments)   bowel obst   Solifenacin Rash        Medication List     STOP taking these medications    albuterol 108 (90 Base) MCG/ACT inhaler Commonly known as: VENTOLIN HFA   cetirizine 10 MG tablet Commonly known as: ZYRTEC   docusate sodium 100 MG capsule Commonly known as: COLACE   fluticasone 50 MCG/ACT nasal spray Commonly known as: FLONASE   ondansetron 4 MG disintegrating tablet Commonly known as: Zofran ODT       TAKE these medications    amLODipine 5 MG  tablet Commonly known as: NORVASC Take 1 tablet (5 mg total) by mouth daily.   amoxicillin-clavulanate 875-125 MG tablet Commonly known as: AUGMENTIN Take 1 tablet by mouth every 12 (twelve) hours for 7 days.   aspirin 81 MG chewable tablet Chew 1 tablet (81 mg total) by mouth daily.   Breztri Aerosphere 160-9-4.8 MCG/ACT Aero Generic drug: Budeson-Glycopyrrol-Formoterol Inhale 2 puffs into the lungs in the morning and at bedtime.   buPROPion 150 MG 12 hr tablet Commonly known as: WELLBUTRIN SR Take 150 mg by mouth 2 (two) times daily.   cloNIDine 0.2 MG tablet Commonly known as: CATAPRES Take 1 tablet (0.2 mg total) by mouth 3 (three) times daily.   clopidogrel 75 MG tablet Commonly known as: PLAVIX Take 1 tablet (75 mg total) by mouth daily. What changed: when to take this   ezetimibe 10 MG tablet Commonly known as: ZETIA Take 1 tablet (10 mg total) by mouth daily.   hydrALAZINE 100 MG tablet Commonly known as: APRESOLINE Take 1 tablet (100 mg total) by mouth 3 (three) times daily.   isosorbide dinitrate 30 MG tablet Commonly known as: ISORDIL TAKE 1 TABLET(30 MG) BY MOUTH THREE TIMES DAILY What changed: See the new instructions.   metoprolol succinate 100 MG 24 hr tablet Commonly known as: TOPROL-XL Take 1 tablet (100 mg total) by mouth 2 (two) times daily. Take with or immediately following a meal.   montelukast 10 MG tablet Commonly known as: SINGULAIR Take 1 tablet (10 mg total) by mouth at bedtime.   nitroGLYCERIN 0.4 MG SL tablet Commonly known as: NITROSTAT Place 0.4 mg under the tongue every 5 (five) minutes as needed for chest pain.   paliperidone 6 MG 24 hr tablet Commonly known as: INVEGA Take 6 mg by mouth at bedtime.   pantoprazole 40 MG tablet Commonly known as: PROTONIX TAKE 1 TABLET(40 MG) BY MOUTH TWICE DAILY What changed: See the new instructions.   Spacer/Aero-Holding [employer] Use as directed with inhaler.   sucralfate 1  GM/10ML suspension Commonly known as: Carafate Take 10 mLs (1 g total) by mouth 4 (four) times daily -  with meals and at bedtime. What changed:  when to take this reasons to take this        Follow-up Information     Surgery, [facility]. Schedule an appointment as soon as possible for a visit in 1 month(s).   Specialty: General Surgery Why: to discuss removal of gallbladder Contact information: [address] [place] [address] [phone]                Discharge Exam: Filed Weights   10/03/22 0535 10/04/22 0520 10/05/22 0602  Weight: 82.7 kg 82.5 kg 83.4 kg   BP (!) 151/91 (BP Location: Left Arm)   Pulse 60   Temp 97.6 F (36.4 C) (Oral)  Resp 18   Ht 6\' 1"  (1.854 m)   Wt 83.4 kg   SpO2 98%   BMI 24.26 kg/m   Patient with no chest pain or dyspnea, no abdominal pain, no nausea or vomiting. He has been tolerating well soft diet.   Neurology awake and alert ENT with mild pallor Cardiovascular with S1 and S2 present and regular with no gallops, rubs or murmurs No JVD No lower extremity edema Respiratory with no rales or wheezing, no rhonchi Abdomen with no distention, non tender or distended, no rebound or guarding.   Condition at discharge: stable  The results of significant diagnostics from this hospitalization (including imaging, microbiology, ancillary and laboratory) are listed below for reference.   Imaging Studies: US Abdomen Limited RUQ (LIVER/GB)  Result Date: 10/03/2022 CLINICAL DATA:  Calculus cholecystitis EXAM: ULTRASOUND ABDOMEN LIMITED RIGHT UPPER QUADRANT COMPARISON:  10/01/2022 FINDINGS: Gallbladder: Gallstones: Present Sludge: None Gallbladder Wall: Thickened measuring up to 5 mm. Pericholecystic fluid: None Sonographic Murphy's Sign: Negative per technologist Non-mobile, echogenic structure measuring 5 mm, appears adherent to the gallbladder wall most likely represents a polyp or tumefactive sludge. Common bile duct:  Diameter: 3 mm Liver: Visualization of the liver is limited due to shadowing from adjacent air containing bowel loops. Parenchymal echogenicity: Within normal limits Contours: Normal Lesions: None Portal vein: Patent.  Hepatopetal flow Other: Incidental note of simple right renal cysts measuring 6.8 cm. IMPRESSION: Cholelithiasis with gallbladder wall thickening remains suspicious for cholecystitis. Electronically Signed   By: Acquanetta Belling M.D.   On: 10/03/2022 10:58   ECHOCARDIOGRAM COMPLETE  Result Date: 10/02/2022    ECHOCARDIOGRAM REPORT   Patient Name:   MARIUS DANEHY Date of Exam: 10/02/2022 Medical Rec #:  [id number]       Height:       73.0 in Accession #:    [id number]      Weight:       185.7 lb Date of Birth:  08/09/1960       BSA:          2.085 m Patient Age:    61 years        BP:           131/93 mmHg Patient Gender: M               HR:           69 bpm. Exam Location:  Inpatient Procedure: 2D Echo, Cardiac Doppler and Color Doppler Indications:    CAD Native Vessel I25.10  History:        Patient has prior history of Echocardiogram examinations, most                 recent 05/26/2022. CAD, Pacemaker, CKD 3 and COPD; Risk                 Factors:Former Smoker, Dyslipidemia and Hypertension.  Sonographer:    Dondra Prader RVT RCS Referring Phys: [id number] MIHAI CROITORU IMPRESSIONS  1. Left ventricular ejection fraction, by estimation, is 55 to 60%. Left ventricular ejection fraction by 3D volume is 57 %. The left ventricle has normal function. The left ventricle has no regional wall motion abnormalities. There is mild asymmetric left ventricular hypertrophy of the basal-septal segment. Left ventricular diastolic parameters are consistent with Grade I diastolic dysfunction (impaired relaxation).  2. Right ventricular systolic function is normal. The right ventricular size is normal.  3. The mitral valve is abnormal. Mild mitral valve regurgitation.  4. The aortic  valve is tricuspid. Aortic valve  regurgitation is not visualized. Comparison(s): Changes from prior study are noted. 05/26/2022: LVEF 40-45%, pardoxical septal motion, apical hypokinesis. FINDINGS  Left Ventricle: Left ventricular ejection fraction, by estimation, is 55 to 60%. Left ventricular ejection fraction by 3D volume is 57 %. The left ventricle has normal function. The left ventricle has no regional wall motion abnormalities. The left ventricular internal cavity size was normal in size. There is mild asymmetric left ventricular hypertrophy of the basal-septal segment. Abnormal (paradoxical) septal motion consistent with post-operative status. Left ventricular diastolic parameters are consistent with Grade I diastolic dysfunction (impaired relaxation). Indeterminate filling pressures. Right Ventricle: The right ventricular size is normal. No increase in right ventricular wall thickness. Right ventricular systolic function is normal. Left Atrium: Left atrial size was normal in size. Right Atrium: Right atrial size was normal in size. Pericardium: There is no evidence of pericardial effusion. Mitral Valve: The mitral valve is abnormal. There is mild thickening of the anterior mitral valve leaflet(s). Mild mitral valve regurgitation. Tricuspid Valve: The tricuspid valve is grossly normal. Tricuspid valve regurgitation is mild. Aortic Valve: The aortic valve is tricuspid. Aortic valve regurgitation is not visualized. Aortic valve mean gradient measures 2.0 mmHg. Aortic valve peak gradient measures 3.7 mmHg. Aortic valve area, by VTI measures 2.46 cm. Pulmonic Valve: The pulmonic valve was not well visualized. Pulmonic valve regurgitation is not visualized. Aorta: The aortic root and ascending aorta are structurally normal, with no evidence of dilitation. Venous: The inferior vena cava was not well visualized. IAS/Shunts: No atrial level shunt detected by color flow Doppler.  LEFT VENTRICLE PLAX 2D LVIDd:         4.00 cm         Diastology LVIDs:          2.60 cm         LV e' medial:    5.96 cm/s LV PW:         1.00 cm         LV E/e' medial:  9.1 LV IVS:        1.30 cm         LV e' lateral:   8.94 cm/s LVOT diam:     1.90 cm         LV E/e' lateral: 6.1 LV SV:         48 LV SV Index:   23 LVOT Area:     2.84 cm        3D Volume EF                                LV 3D EF:    Left                                             ventricul                                             ar  ejection                                             fraction                                             by 3D                                             volume is                                             57 %.                                 3D Volume EF:                                3D EF:        57 %                                LV EDV:       119 ml                                LV ESV:       52 ml                                LV SV:        68 ml RIGHT VENTRICLE RV S prime:     11.20 cm/s TAPSE (M-mode): 2.1 cm LEFT ATRIUM             Index        RIGHT ATRIUM           Index LA diam:        3.30 cm 1.58 cm/m   RA Area:     15.50 cm LA Vol (A2C):   42.0 ml 20.15 ml/m  RA Volume:   36.30 ml  17.41 ml/m LA Vol (A4C):   46.2 ml 22.16 ml/m LA Biplane Vol: 46.4 ml 22.26 ml/m  AORTIC VALVE                    PULMONIC VALVE AV Area (Vmax):    2.25 cm     PV Vmax:          0.87 m/s AV Area (Vmean):   2.17 cm     PV Peak grad:     3.1 mmHg AV Area (VTI):     2.46 cm     PR End Diast Vel: 4.24 msec AV Vmax:           96.40 cm/s AV Vmean:  65.000 cm/s AV VTI:            0.196 m AV Peak Grad:      3.7 mmHg AV Mean Grad:      2.0 mmHg LVOT Vmax:         76.40 cm/s LVOT Vmean:        49.700 cm/s LVOT VTI:          0.170 m LVOT/AV VTI ratio: 0.87  AORTA Ao Root diam: 3.50 cm Ao Asc diam:  3.20 cm MITRAL VALVE               TRICUSPID VALVE MV Area (PHT): 2.68 cm    TR Peak grad:   32.9 mmHg MV Decel Time: 283 msec    TR  Vmax:        287.00 cm/s MV E velocity: 54.50 cm/s MV A velocity: 62.90 cm/s  SHUNTS MV E/A ratio:  0.87        Systemic VTI:  0.17 m                            Systemic Diam: 1.90 cm Zoila Shutter MD Electronically signed by Zoila Shutter MD Signature Date/Time: 10/02/2022/2:19:49 PM    Final    US Abdomen Limited RUQ (LIVER/GB)  Result Date: 10/01/2022 CLINICAL DATA:  Gallstones. EXAM: ULTRASOUND ABDOMEN LIMITED RIGHT UPPER QUADRANT COMPARISON:  CT from earlier today FINDINGS: Gallbladder: 5 mm stone identified within the gallbladder. The gallbladder appears partially decompressed with wall thickening measuring up to 6.6 mm. Positive sonographic Murphy's sign was reported by the sonographer. Common bile duct: Diameter: 3.1 Liver: No focal lesion identified. Within normal limits in parenchymal echogenicity. Portal vein is patent on color Doppler imaging with normal direction of blood flow towards the liver. Other: Diminished exam detail due to overlying bowel gas. IMPRESSION: Gallstone with gallbladder wall thickening and positive sonographic Murphy's sign. Cannot exclude acute cholecystitis. Electronically Signed   By: Signa Kell M.D.   On: 10/01/2022 10:18   CT Angio Chest/Abd/Pel for Dissection W and/or W/WO  Result Date: 10/01/2022 CLINICAL DATA:  Acute aortic syndrome. EXAM: CT ANGIOGRAPHY CHEST, ABDOMEN AND PELVIS TECHNIQUE: Non-contrast CT of the chest was initially obtained. Multidetector CT imaging through the chest, abdomen and pelvis was performed using the standard protocol during bolus administration of intravenous contrast. Multiplanar reconstructed images and MIPs were obtained and reviewed to evaluate the vascular anatomy. RADIATION DOSE REDUCTION: This exam was performed according to the departmental dose-optimization program which includes automated exposure control, adjustment of the mA and/or kV according to patient size and/or use of iterative reconstruction technique. CONTRAST:   OMNIPAQUE IOHEXOL 350 MG/ML SOLN COMPARISON:  Abdomen pelvis CT 10/21/2020 FINDINGS: CTA CHEST FINDINGS Cardiovascular: The heart size is normal. No substantial pericardial effusion. Coronary artery calcification is evident. Coronary stent device evident. Mild atherosclerotic calcification is noted in the wall of the thoracic aorta. Left-sided permanent pacemaker evident. Precontrast imaging shows no hyperdense crescent in the wall of the thoracic aorta to suggest the presence of an acute intramural hematoma. No thoracic aortic aneurysm. Imaging after IV contrast administration shows no dissection of the thoracic aorta. Sagittal imaging reveals a penetrating ulcer or ulcerated plaque along the under surface of the transverse aorta measuring on the order of 11 x 8 mm. This is also seen on axial image 43/6. Mediastinum/Nodes: No mediastinal lymphadenopathy. No evidence for gross hilar lymphadenopathy although assessment is limited by the lack of  intravenous contrast on the current study. The esophagus has normal imaging features. There is no axillary lymphadenopathy. Lungs/Pleura: Centrilobular and paraseptal emphysema evident. No suspicious pulmonary nodule or mass. No focal airspace consolidation. No pleural effusion. Musculoskeletal: No worrisome lytic or sclerotic osseous abnormality. Review of the MIP images confirms the above findings. CTA ABDOMEN AND PELVIS FINDINGS VASCULAR Aorta: Infrarenal abdominal aorta measures 3.9 x 3.7 cm in maximum orthogonal diameter. Atherosclerotic changes evident with prominent mural thrombus in the aneurysmal segment. No dissection, vasculitis or significant stenosis. Celiac: Patent without evidence of aneurysm, dissection, vasculitis or significant stenosis. SMA: Patent without evidence of aneurysm, dissection, vasculitis or significant stenosis. Renals: Both renal arteries are patent without evidence of aneurysm, dissection, vasculitis, fibromuscular dysplasia or  significant stenosis. IMA: Chronic occlusion Inflow: Patent without evidence of aneurysm, dissection, vasculitis or significant stenosis. Veins: No obvious venous abnormality within the limitations of this arterial phase study. Review of the MIP images confirms the above findings. NON-VASCULAR Hepatobiliary: No suspicious focal abnormality within the liver parenchyma. Gallbladder is decompressed which accentuates wall thickness gallbladder wall is ill-defined raising the question of pericholecystic edema. 6 mm gallstone evident. No intrahepatic or extrahepatic biliary dilation. Pancreas: Diffuse parenchymal calcification consistent with chronic pancreatitis. No main duct dilatation. Spleen: No splenomegaly. No focal mass lesion. Adrenals/Urinary Tract: No adrenal nodule or mass. Large central sinus cysts noted left kidney. There is dilatation of an upper pole calyx, similar to CT scan from 09/25/2014, potentially due to mass-effect from the large central sinus cyst. Overlying cortical thinning right kidney with levin mm stone upper interpolar region and 10 mm stone in the lower interpolar region. No associated right hydroureter. Component of right UPJ obstruction cannot be excluded. 5.5 cm exophytic low-density lesion upper pole left kidney has increased from 4.7 cm on the 2022 exam. 15 mm subcapsular lesion upper pole left kidney is similar to prior. Both of these lesions have attenuation slightly higher than would be expected for simple cyst. 8 mm nonobstructing stone identified lower pole left kidney. Left ureter unremarkable. The urinary bladder appears normal for the degree of distention. Stomach/Bowel: Stomach is unremarkable. No gastric wall thickening. No evidence of outlet obstruction. Duodenum is normally positioned as is the ligament of Treitz. No small bowel wall thickening. No small bowel dilatation. The terminal ileum is normal. The appendix is not well visualized, but there is no edema or inflammation  in the region of the cecum. No gross colonic mass. No colonic wall thickening. Lymphatic: There is no gastrohepatic or hepatoduodenal ligament lymphadenopathy. No retroperitoneal or mesenteric lymphadenopathy. No pelvic sidewall lymphadenopathy. Reproductive: The prostate gland and seminal vesicles are unremarkable. Other: No intraperitoneal free fluid. Musculoskeletal: No worrisome lytic or sclerotic osseous abnormality. Review of the MIP images confirms the above findings. IMPRESSION: 1. No evidence for acute intramural hematoma or dissection in the thoracoabdominal aorta. 2. 11 x 8 mm penetrating ulcer or ulcerated plaque along the under surface of the transverse aorta. 3. 3.9 x 3.7 cm infrarenal abdominal aortic aneurysm. 4. Gallbladder is decompressed which accentuates wall thickness. Gallbladder wall is ill-defined raising the question of pericholecystic edema. There is a 6 mm gallstone evident. Right upper quadrant ultrasound may prove helpful to further evaluate. 5. Large central sinus cyst left kidney with dilatation of an upper pole calyx, similar to CT scan from 09/25/2014, potentially due to mass-effect from the large central sinus cyst. Component of right UPJ obstruction cannot be excluded. 6. Bilateral nonobstructing nephrolithiasis. 7. 5.5 cm exophytic low-density lesion upper pole left  kidney has increased from 4.7 cm on the 2022 exam. Another smaller upper pole left renal lesion is also mildly progressed. Both of these lesions have attenuation slightly higher than would be expected for simple cysts. While likely cysts complicated by proteinaceous debris or hemorrhage. Given the presence of permanent pacemaker, follow-up CT abdomen with and without contrast may be warranted to exclude hypoenhancing neoplasm. 8. Chronic pancreatitis. 9.  Emphysema (ICD10-J43.9). Electronically Signed   By: Kennith Center M.D.   On: 10/01/2022 08:51   US Aorta  Result Date: 10/01/2022 CLINICAL DATA:  Abdominal pain.  History of abdominal aortic aneurysm. EXAM: ULTRASOUND OF ABDOMINAL AORTA TECHNIQUE: Ultrasound examination of the abdominal aorta and proximal common iliac arteries was performed to evaluate for aneurysm. Additional color and Doppler images of the distal aorta were obtained to document patency. COMPARISON:  CT abdomen/pelvis 10/21/2020 FINDINGS: Abdominal aortic measurements as follows: Proximal:  2.7 x 2.9 cm Mid:  2.1 x 2.0 cm Distal:  3.5 x 3.9 cm Patent: Yes, peak systolic velocity is 63.6 cm/s Right common iliac artery: 1.0 x 1.1 cm Left common iliac artery: 0.7 x 1.1 cm IMPRESSION: 3.9 cm abdominal aortic aneurysm. Infrarenal abdominal aorta was 3.4 cm maximum diameter when remeasured on the study from 10/21/2020 compatible with mild interval progression. Ultrasound can be insensitive for acute aortic aneurysm leak and dissection. Recommend follow-up ultrasound every 2 years. This recommendation follows ACR consensus guidelines: White Paper of the ACR Incidental Findings Committee II on Vascular Findings. J Am Coll Radiol 2013; 10:789-794. Electronically Signed   By: Kennith Center M.D.   On: 10/01/2022 07:30   CUP PACEART REMOTE DEVICE CHECK  Result Date: 09/25/2022 Scheduled remote reviewed. Normal device function.  Next remote 91 days. [place], CVRS   Microbiology: Results for orders placed or performed during the hospital encounter of 09/06/21  Resp Panel by RT-PCR (Flu A&B, Covid) Nasopharyngeal Swab     Status: None   Collection Time: 09/06/21  2:00 AM   Specimen: Nasopharyngeal Swab; Nasopharyngeal(NP) swabs in vial transport medium  Result Value Ref Range Status   SARS Coronavirus 2 by RT PCR NEGATIVE NEGATIVE Final    Comment: (NOTE) SARS-CoV-2 target nucleic acids are NOT DETECTED.  The SARS-CoV-2 RNA is generally detectable in upper respiratory specimens during the acute phase of infection. The lowest concentration of SARS-CoV-2 viral copies this assay can detect is 138 copies/mL. A  negative result does not preclude SARS-Cov-2 infection and should not be used as the sole basis for treatment or other patient management decisions. A negative result may occur with  improper specimen collection/handling, submission of specimen other than nasopharyngeal swab, presence of viral mutation(s) within the areas targeted by this assay, and inadequate number of viral copies(<138 copies/mL). A negative result must be combined with clinical observations, patient history, and epidemiological information. The expected result is Negative.  Fact Sheet for Patients:  BloggerCourse.com  Fact Sheet for Healthcare Providers:  SeriousBroker.it  This test is no t yet approved or cleared by the [place] FDA and  has been authorized for detection and/or diagnosis of SARS-CoV-2 by FDA under an Emergency Use Authorization (EUA). This EUA will remain  in effect (meaning this test can be used) for the duration of the COVID-19 declaration under Section 564(b)(1) of the Act, 21 U.S.C.section 360bbb-3(b)(1), unless the authorization is terminated  or revoked sooner.       Influenza A by PCR NEGATIVE NEGATIVE Final   Influenza B by PCR NEGATIVE NEGATIVE Final  Comment: (NOTE) The Xpert Xpress SARS-CoV-2/FLU/RSV plus assay is intended as an aid in the diagnosis of influenza from Nasopharyngeal swab specimens and should not be used as a sole basis for treatment. Nasal washings and aspirates are unacceptable for Xpert Xpress SARS-CoV-2/FLU/RSV testing.  Fact Sheet for Patients: BloggerCourse.com  Fact Sheet for Healthcare Providers: SeriousBroker.it  This test is not yet approved or cleared by the [place] FDA and has been authorized for detection and/or diagnosis of SARS-CoV-2 by FDA under an Emergency Use Authorization (EUA). This EUA will remain in effect (meaning this test can  be used) for the duration of the COVID-19 declaration under Section 564(b)(1) of the Act, 21 U.S.C. section 360bbb-3(b)(1), unless the authorization is terminated or revoked.  Performed at [facility], [address] [place], [place] [address]     Labs: CBC: Recent Labs  Lab 10/01/22 0420 10/02/22 0243 10/04/22 0124 10/05/22 0324  WBC 8.2 8.3 7.6 6.6  HGB 13.4 12.9* 12.8* 12.9*  HCT 42.5 39.9 39.0 38.6*  MCV 88.9 88.5 86.7 86.9  PLT 210 186 191 188   Basic Metabolic Panel: Recent Labs  Lab 10/01/22 0420 10/02/22 0243 10/04/22 0124 10/05/22 0324  NA 133* 133* 132* 134*  K 4.6 5.1 4.1 4.5  CL 100 107 102 103  CO2 25 19* 23 21*  GLUCOSE 104* 103* 100* 121*  BUN 25* 21 22 28*  CREATININE 1.94* 1.77* 2.01* 2.14*  CALCIUM 8.7* 8.1* 8.8* 8.8*   Liver Function Tests: Recent Labs  Lab 10/01/22 0420 10/02/22 0243 10/04/22 0124  AST 23 20 16   ALT 17 15 12   ALKPHOS 66 64 58  BILITOT 0.8 0.6 0.9  PROT 6.9 6.1* 6.3*  ALBUMIN 3.7 3.3* 3.3*   CBG: No results for input(s): "GLUCAP" in the last 168 hours.  Discharge time spent: greater than 30 minutes.  Signed: Coralie Keens, MD [facility] 10/05/2022

## 2022-10-05 NOTE — Progress Notes (Signed)
Patient's BP prior to morning medications being given was 199/104. One hour post medication administration, BP was 179/100. Dr. Ella Jubilee was notified and stated that it was still appropriate for patient to be discharged. Patient discharged to home with wife after picking up medications from [facility] pharmacy.

## 2022-10-09 ENCOUNTER — Telehealth: Payer: Self-pay | Admitting: Vascular Surgery

## 2022-10-09 NOTE — Telephone Encounter (Signed)
Appt has been scheduled.

## 2022-10-09 NOTE — Telephone Encounter (Signed)
-----   Message from Chuck Hint, MD sent at 10/02/2022  7:27 AM EDT ----- Regarding: charge and f/u visit Level 4 consult.  This is the gentleman that I called in that would need a follow-up CT angio of the chest in 4 to 6 weeks because of a penetrating aortic ulcer.  Of note, he saw Dr. Randie Heinz in August 2022 with a small aneurysm.  He was unable to pay his co-pay.  When the office was going to arrange for a follow-up visit he tells me, "since I did not pay my co-pay I could not be seen.".  Can we please be sure that he has a follow-up duplex of his abdominal aortic aneurysm in 1 year in addition to plan for a CT angio of his chest in 4 to 6 weeks.  Thank you.  CD

## 2022-10-12 ENCOUNTER — Telehealth: Payer: Self-pay | Admitting: Vascular Surgery

## 2022-10-12 ENCOUNTER — Other Ambulatory Visit: Payer: Self-pay

## 2022-10-12 DIAGNOSIS — I719 Aortic aneurysm of unspecified site, without rupture: Secondary | ICD-10-CM

## 2022-10-12 NOTE — Telephone Encounter (Signed)
LVM to fill out CT imaging sheet

## 2022-10-20 NOTE — Progress Notes (Signed)
Remote pacemaker transmission.   

## 2022-10-26 ENCOUNTER — Ambulatory Visit (HOSPITAL_COMMUNITY)
Admission: RE | Admit: 2022-10-26 | Discharge: 2022-10-26 | Disposition: A | Discharge status: Home / Self Care | Payer: 59 | Source: Ambulatory Visit | Attending: Vascular Surgery | Admitting: Vascular Surgery

## 2022-10-26 DIAGNOSIS — I719 Aortic aneurysm of unspecified site, without rupture: Secondary | ICD-10-CM | POA: Diagnosis present

## 2022-10-26 MED ORDER — IOHEXOL 350 MG/ML SOLN
60.0000 mL | Freq: Once | INTRAVENOUS | Status: AC | PRN
  Administered 2022-10-26: 60 mL via INTRAVENOUS

## 2022-11-02 ENCOUNTER — Encounter: Payer: Self-pay | Admitting: Vascular Surgery

## 2022-11-02 ENCOUNTER — Ambulatory Visit (INDEPENDENT_AMBULATORY_CARE_PROVIDER_SITE_OTHER): Payer: 59 | Admitting: Vascular Surgery

## 2022-11-02 VITALS — BP 131/90 | HR 73 | Temp 97.9°F | Resp 20 | Ht 73.0 in | Wt 193.0 lb

## 2022-11-02 DIAGNOSIS — I719 Aortic aneurysm of unspecified site, without rupture: Secondary | ICD-10-CM | POA: Diagnosis not present

## 2022-11-02 DIAGNOSIS — I7143 Infrarenal abdominal aortic aneurysm, without rupture: Secondary | ICD-10-CM | POA: Diagnosis not present

## 2022-11-02 NOTE — Progress Notes (Signed)
REASON FOR VISIT:   Follow-up of penetrating aortic ulcer  MEDICAL ISSUES:   PENETRATING AORTIC ULCER: His 1 month follow-up CT angio of the chest shows no change in the penetrating aortic ulcer along the inferior aspect of the transverse thoracic aorta.  This was an incidental finding back in May and was asymptomatic.  The ulcer is located fairly close to the proximity of the takeoff of the left subclavian artery.  His blood pressure has been under good control and I have reinforced with him the importance of keeping his blood pressure under good control.  He is not a smoker.  I have ordered a follow-up CT angiogram of the chest in 1 year.  He understands that I will be retiring so he will be seen by Dr. Randie Heinz who had previously seen him with a small abdominal aortic aneurysm.  ABDOMINAL AORTIC ANEURYSM: His CT angio on 10/02/2022 showed that his aneurysm was 3.9 cm in maximal diameter.  He understands we would not consider elective repair of an aneurysm unless it were 5.5 cm in maximum diameter.  He was previously seen by Dr. Randie Heinz.  When he comes in in 1 year with his follow-up CT of the chest, I have him get an ultrasound of his aortic aneurysm so that we can keep an eye on that.  He is not a smoker.  His blood pressure is under good control.   HPI:   Peter Marsh is a pleasant 62 y.o. male who I saw in consultation on 10/02/2022 with a penetrating aortic ulcer on the inferior aspect of the transverse thoracic aorta.  This was an incidental finding was asymptomatic.  I recommended a follow-up CT angio in 4 to 6 weeks.  In addition the patient was being followed by Dr. Pascal Lux with a small aneurysm.  He was seen in August 2022 and at that time it was 3.2 cm in maximum diameter.  On his most recent consult it was 3.9 cm in maximum diameter.  Since I saw him in the hospital, he denies any chest pain, back pain, or abdominal pain.  He is not a smoker.  His blood pressure has been under good  control.  Past Medical History:  Diagnosis Date   Allergy    Anxiety    Asthma    uses inhalers    Bilateral carotid bruits    Cardiac conduction disorder 2018   s/p MDT PPM   Cataract    Chest pain 06/24/2018   Chronic kidney disease    bladder interstial cystitis    Chronic kidney disease (CKD), stage IV (severe) ([name])    followed by Dr. Marisue Humble at [facility]   COPD (chronic obstructive pulmonary disease) ([name])    Coronary artery disease    COVID-19 virus infection 10/21/2020   Depression    Diabetes mellitus without complication ([name])    Discord with neighbors, lodgers and landlord 11/26/2021   Encounter for care of pacemaker 02/13/2019   GERD (gastroesophageal reflux disease)    Hematemesis 09/16/2016   History of stomach ulcers 2001   Hypertension    LBBB (left bundle branch block)    Lower extremity edema    Mild intermittent asthma without complication    Mixed hyperlipidemia    Mobitz type 2 second degree AV block 04/06/2019   Neuropathy    Pacemaker: Medtronic Azure XT DR MRI [id number] PPM -  BUNDLE OF HIS pacing  02/26/2017   Scheduled Remote pacemaker check  11/12/2018:  There were 24 Fast AV episodes:  EGMs show SVTs. Episodes lasted < 2 minutes. Health trends do not demonstrate significant abnormality. Battery longevity is 9.4 - 10.3 years. RA pacing is 47.1 %, RV pacing is 40.4 %.  Clinic check 11/07/17.    Paroxysmal atrial flutter ([name])    PONV (postoperative nausea and vomiting)    Prostatitis    Recurrent upper respiratory infection (URI)    Schizophrenia ([name])    Sinus node dysfunction ([name]) 02/13/2019   Urticaria     Family History  Problem Relation Age of Onset   High blood pressure Mother    Alzheimer's disease Father    Heart attack Brother    Colon cancer Neg Hx    Esophageal cancer Neg Hx    Rectal cancer Neg Hx    Stomach cancer Neg Hx     SOCIAL HISTORY: Social History   Tobacco Use   Smoking status: Former    Packs/day: 1.00     Years: 30.00    Additional pack years: 0.00    Total pack years: 30.00    Types: Cigarettes    Quit date: 05/08/2008    Years since quitting: 14.4   Smokeless tobacco: Never  Substance Use Topics   Alcohol use: No    Allergies  Allergen Reactions   Methylpyrrolidone Hives    froze the intestine   Niacin Itching, Nausea And Vomiting and Other (See Comments)    Flushing, itching, tingling    Nsaids Other (See Comments)    Other Reaction(s): CKD 4   Ace Inhibitors     Other reaction(s): CKD 4   Norvasc [Amlodipine Besylate] Other (See Comments)    Swollen Feet   Other Other (See Comments)   Oxybutynin Chloride [Oxybutynin Chloride Er] Other (See Comments)    froze the intestine   Solifenacin Succinate Other (See Comments)   Statins     Reports severe reaction but cannot remember exactly what it was    Vesicare [Solifenacin Succinate] Other (See Comments)    Froze the intestine    Ciprofloxacin Rash and Other (See Comments)    Felt flushed    Oxybutynin Rash and Other (See Comments)    bowel obst   Solifenacin Rash    Current Outpatient Medications  Medication Sig Dispense Refill   amLODipine (NORVASC) 5 MG tablet Take 1 tablet (5 mg total) by mouth daily. 30 tablet 0   aspirin 81 MG chewable tablet Chew 1 tablet (81 mg total) by mouth daily. 30 tablet 3   Budeson-Glycopyrrol-Formoterol (BREZTRI AEROSPHERE) 160-9-4.8 MCG/ACT AERO Inhale 2 puffs into the lungs in the morning and at bedtime. 10.7 g 5   buPROPion (WELLBUTRIN SR) 150 MG 12 hr tablet Take 150 mg by mouth 2 (two) times daily.     cloNIDine (CATAPRES) 0.2 MG tablet Take 1 tablet (0.2 mg total) by mouth 3 (three) times daily. 270 tablet 3   clopidogrel (PLAVIX) 75 MG tablet Take 1 tablet (75 mg total) by mouth daily. (Patient taking differently: Take 75 mg by mouth 2 (two) times daily.) 90 tablet 3   ezetimibe (ZETIA) 10 MG tablet Take 1 tablet (10 mg total) by mouth daily. 90 tablet 3   hydrALAZINE (APRESOLINE)  100 MG tablet Take 1 tablet (100 mg total) by mouth 3 (three) times daily. 270 tablet 1   isosorbide dinitrate (ISORDIL) 30 MG tablet TAKE 1 TABLET(30 MG) BY MOUTH THREE TIMES DAILY (Patient taking differently: Take 30 mg by mouth 3 (three) times  daily.) 270 tablet 0   metoprolol succinate (TOPROL-XL) 100 MG 24 hr tablet Take 1 tablet (100 mg total) by mouth 2 (two) times daily. Take with or immediately following a meal. 60 tablet 2   montelukast (SINGULAIR) 10 MG tablet Take 1 tablet (10 mg total) by mouth at bedtime. 90 tablet 1   nitroGLYCERIN (NITROSTAT) 0.4 MG SL tablet Place 0.4 mg under the tongue every 5 (five) minutes as needed for chest pain.     paliperidone (INVEGA) 6 MG 24 hr tablet Take 6 mg by mouth at bedtime.     pantoprazole (PROTONIX) 40 MG tablet TAKE 1 TABLET(40 MG) BY MOUTH TWICE DAILY (Patient taking differently: Take 40 mg by mouth 2 (two) times daily.) 60 tablet 5   Spacer/Aero-Holding [facility] Use as directed with inhaler. 1 each 0   sucralfate (CARAFATE) 1 GM/10ML suspension Take 10 mLs (1 g total) by mouth 4 (four) times daily -  with meals and at bedtime. (Patient taking differently: Take 1 g by mouth daily as needed (for ulcers).) 3600 mL 3   No current facility-administered medications for this visit.   Facility-Administered Medications Ordered in Other Visits  Medication Dose Route Frequency Provider Last Rate Last Admin   acetaminophen (OFIRMEV) IVPB    PRN Illene Silver, CRNA   1,000 mg at 04/19/11 0910   glycopyrrolate (ROBINUL) injection    PRN Illene Silver, CRNA   0.8 mg at 04/19/11 1037    REVIEW OF SYSTEMS:  [X]  denotes positive finding, [ ]  denotes negative finding Cardiac  Comments:  Chest pain or chest pressure:    Shortness of breath upon exertion:    Short of breath when lying flat:    Irregular heart rhythm:        Vascular    Pain in calf, thigh, or hip brought on by ambulation:    Pain in feet at night that wakes you up from your  sleep:     Blood clot in your veins:    Leg swelling:         Pulmonary    Oxygen at home:    Productive cough:     Wheezing:         Neurologic    Sudden weakness in arms or legs:     Sudden numbness in arms or legs:     Sudden onset of difficulty speaking or slurred speech:    Temporary loss of vision in one eye:     Problems with dizziness:         Gastrointestinal    Blood in stool:     Vomited blood:         Genitourinary    Burning when urinating:     Blood in urine:        Psychiatric    Major depression:         Hematologic    Bleeding problems:    Problems with blood clotting too easily:        Skin    Rashes or ulcers:        Constitutional    Fever or chills:     PHYSICAL EXAM:   Vitals:   11/02/22 1400  BP: (!) 131/90  Pulse: 73  Resp: 20  Temp: 97.9 F (36.6 C)  SpO2: 96%  Weight: 193 lb (87.5 kg)  Height: 6\' 1"  (1.854 m)    GENERAL: The patient is a well-nourished male, in no acute distress. The vital signs  are documented above. CARDIAC: There is a regular rate and rhythm.  VASCULAR: I do not detect carotid bruits. He has palpable femoral, popliteal, dorsalis pedis pulses by laterally. PULMONARY: There is good air exchange bilaterally without wheezing or rales. ABDOMEN: Soft and non-tender with normal pitched bowel sounds.  MUSCULOSKELETAL: There are no major deformities or cyanosis. NEUROLOGIC: No focal weakness or paresthesias are detected. SKIN: There are no ulcers or rashes noted. PSYCHIATRIC: The patient has a normal affect.  DATA:    CT ANGIO CHEST: His CT angio of the chest shows no change in the penetrating ulcer along the inferior aspect of the transverse thoracic aorta.  There is no inflammation or significant hematoma.Waverly Ferrari [facility] [phone]

## 2022-11-14 NOTE — Progress Notes (Unsigned)
Office Visit    Patient Name: Peter Marsh Date of Encounter: 11/14/2022  Primary Care Provider:  Tracey Harries, MD Primary Cardiologist:  Reatha Harps, MD  Chief Complaint  62 year old male with a past medical history of CAD, hyperlipidemia, ischemic cardiomyopathy, L BBB, HTN, CKD stage IV, paroxysmal atrial fibrillation/flutter, PPM (2018), penetrating aortic ulcer and AAA.    In 05/2022 he underwent cardiac catheterization with PCI and DES to his proximal LAD.  Echo at that time indicated an EF of 40 to 45%, G1 DD.  He has allergy to statin medications, by the lipid clinic.  He previously tried to do self injections with Repatha, was unable to take the medication.  Initially felt to be a good candidate for inclisiran however his insurance would require him to pay $3600 out-of-pocket.   Was last seen in office on 09/05/2022, was doing well at that time.  He was admitted at the end of 09/2022 for abdominal pain, heart care was consulted regarding holding DAPT in the setting of cholecystitis for possible surgery.  As patient had PCI to his proximal LAD in January of this year it was recommended that DAPT not be held, he was initially planned to have [facility] catheter placement but there was no window with decompressed gallbladder. Was started on antibiotics and supportive care.  He was discharged on 10/05/2022, with plans to follow-up with general surgery for eventual cholecystectomy as an outpatient. Per Dr. Erin Hearing note, he would favor allowing patient to complete 6 months of DAPT and consider surgery in the second half of July.   In his workup during admission it was also noted that he has a penetrating aortic ulcer and stable abdominal aortic aneurysm, both stable, now followed by vascular surgery.   He was seen on 11/02/22 by Dr. Edilia Bo with vascular surgery. He had a CT angio of the chest that showed no changed in the penetrating aortic ulcer along the inferior aspect of the transverse  thoracic aorta. He was asymptomatic. He will have follow up CT angio of the chest in one year and will have repeat ultrasound of his aortic aneurysm at that time.   CAD:  S/p DES to proximal LAD  on 05/26/22, current only ASA/Plavix.  Angina?  HLD: Followed by lipid clinic.  Last lipid panel?  Ischemic cardiomyopathy:  Echo in 05/2022 indicated an EF of 40 to 45%, G1 DD. Repeat TTE during admission in May indicated EF now 55-60%. GDMT limited by CKD IV. Continue Clonidine, Isordil, Metoprolol succinate, Hydralazine.   Essential HTN: Blood pressure today: Blood pressure at home:   CKD Stage IV Last creatinine 2.14 and eGFR 34 on 10/05/22.  Followed by?   Paroxysmal atrial fibrillation: EKG today:  Not on OAC as he has history of GI bleeding.   LBB, sinus node dysfunction: Underwent PPM 02/2017. Follows with EP.   Abdominal aortic aneurysm/Penetrating aortic ulcer: On CT angio of the chest on 10/01/22 noted to have  penetrating aortic ulcer along the inferior aspect of the transverse thoracic aorta. He was asymptomatic. His abdominal aortic aneurysm measured at 3.9x3.7cm. He had follow up CT angio of the chest with Dr. Edilia Bo on 6/20 that showed both areas as stable. Vascular plans to follow up in one year with repeat CT angio of the chest and repeat ultrasound of his aortic aneurysm at that time.   Cholecystitis:  Presented to the ED on 10/01/22 for abdominal pain found to have cholecystitis. As he had DES to proximal LAD  in January it was recommend to not hold Plavix for surgery given location of stenting. Was unalbe to have PERC drain due to poor window. He is to see surgery to consider outpatient cholecystectomy. Per Dr. Erin Hearing notes while he was inpatient he recommended allowing completion of 6 months of DAPT then consider surgery in second half of July.  Disposition:   Past Medical History    Past Medical History:  Diagnosis Date   Allergy    Anxiety    Asthma    uses  inhalers    Bilateral carotid bruits    Cardiac conduction disorder 2018   s/p MDT PPM   Cataract    Chest pain 06/24/2018   Chronic kidney disease    bladder interstial cystitis    Chronic kidney disease (CKD), stage IV (severe) ([name])    followed by Dr. Marisue Humble at [facility]   COPD (chronic obstructive pulmonary disease) ([name])    Coronary artery disease    COVID-19 virus infection 10/21/2020   Depression    Diabetes mellitus without complication ([name])    Discord with neighbors, lodgers and landlord 11/26/2021   Encounter for care of pacemaker 02/13/2019   GERD (gastroesophageal reflux disease)    Hematemesis 09/16/2016   History of stomach ulcers 2001   Hypertension    LBBB (left bundle branch block)    Lower extremity edema    Mild intermittent asthma without complication    Mixed hyperlipidemia    Mobitz type 2 second degree AV block 04/06/2019   Neuropathy    Pacemaker: Medtronic Azure XT DR MRI [id number] PPM -  BUNDLE OF HIS pacing  02/26/2017   Scheduled Remote pacemaker check  11/12/2018:  There were 24 Fast AV episodes:  EGMs show SVTs. Episodes lasted < 2 minutes. Health trends do not demonstrate significant abnormality. Battery longevity is 9.4 - 10.3 years. RA pacing is 47.1 %, RV pacing is 40.4 %.  Clinic check 11/07/17.    Paroxysmal atrial flutter ([name])    PONV (postoperative nausea and vomiting)    Prostatitis    Recurrent upper respiratory infection (URI)    Schizophrenia ([name])    Sinus node dysfunction ([name]) 02/13/2019   Urticaria    Past Surgical History:  Procedure Laterality Date   ADENOIDECTOMY     APPENDECTOMY     COLONOSCOPY     CORONARY STENT INTERVENTION N/A 05/26/2022   Procedure: CORONARY STENT INTERVENTION;  Surgeon: Corky Crafts, MD;  Location: [facility] INVASIVE CV LAB;  Service: Cardiovascular;  Laterality: N/A;   CORONARY ULTRASOUND/IVUS N/A 05/26/2022   Procedure: Intravascular Ultrasound/IVUS;  Surgeon: Corky Crafts, MD;   Location: [facility] INVASIVE CV LAB;  Service: Cardiovascular;  Laterality: N/A;   ELECTROPHYSIOLOGY STUDY N/A 02/26/2017   Procedure: ELECTROPHYSIOLOGY STUDY;  Surgeon: Marinus Maw, MD;  Location: [facility] INVASIVE CV LAB;  Service: Cardiovascular;  Laterality: N/A;   ESOPHAGOGASTRODUODENOSCOPY (EGD) WITH PROPOFOL N/A 09/18/2016   Procedure: ESOPHAGOGASTRODUODENOSCOPY (EGD) WITH PROPOFOL;  Surgeon: Kerin Salen, MD;  Location: [facility] ENDOSCOPY;  Service: Gastroenterology;  Laterality: N/A;   LEFT HEART CATH AND CORONARY ANGIOGRAPHY N/A 05/26/2022   Procedure: LEFT HEART CATH AND CORONARY ANGIOGRAPHY;  Surgeon: Corky Crafts, MD;  Location: [facility] INVASIVE CV LAB;  Service: Cardiovascular;  Laterality: N/A;   LUMBAR LAMINECTOMY/DECOMPRESSION MICRODISCECTOMY  04/19/2011   Procedure: LUMBAR LAMINECTOMY/DECOMPRESSION MICRODISCECTOMY;  Surgeon: Jacki Cones;  Location: [facility] ORS;  Service: Orthopedics;  Laterality: Left;  Hemi LAminectomy/Microdiscectomy Lumbar four  - Lumbar five  on the Left (  X-Ray)   PACEMAKER IMPLANT N/A 02/26/2017   Procedure: PACEMAKER IMPLANT;  Surgeon: Marinus Maw, MD;  Location: [facility] INVASIVE CV LAB;  Service: Cardiovascular;  Laterality: N/A;   TONSILLECTOMY     UPPER GASTROINTESTINAL ENDOSCOPY      Allergies  Allergies  Allergen Reactions   Methylpyrrolidone Hives    froze the intestine   Niacin Itching, Nausea And Vomiting and Other (See Comments)    Flushing, itching, tingling    Nsaids Other (See Comments)    Other Reaction(s): CKD 4   Ace Inhibitors     Other reaction(s): CKD 4   Norvasc [Amlodipine Besylate] Other (See Comments)    Swollen Feet   Other Other (See Comments)   Oxybutynin Chloride [Oxybutynin Chloride Er] Other (See Comments)    froze the intestine   Solifenacin Succinate Other (See Comments)   Statins     Reports severe reaction but cannot remember exactly what it was    Vesicare [Solifenacin Succinate] Other (See Comments)    Froze the  intestine    Ciprofloxacin Rash and Other (See Comments)    Felt flushed    Oxybutynin Rash and Other (See Comments)    bowel obst   Solifenacin Rash     Labs/Other Studies Reviewed    The following studies were reviewed today: *** Cardiac Studies & Procedures   CARDIAC CATHETERIZATION  CARDIAC CATHETERIZATION 05/26/2022  Narrative   Prox RCA lesion is 25% stenosed.   Prox LAD lesion is 99% stenosed.  A drug-eluting stent was successfully placed using a SYNERGY XD 3.50X24 postdilated to greater than 4 m in diameter and optimized with intravascular ultrasound..   Post intervention, there is a 0% residual stenosis.   LV end diastolic pressure is normal.   There is no aortic valve stenosis.  Successful PCI of the severe lesion in the proximal LAD.  Continue dual antiplatelet therapy for at least 12 months.  Continue aggressive secondary prevention.  Will decrease his dose of isosorbide from 4 times a day down to 3 times a day.  This can gradually be weaned off based on his symptoms.  Watch overnight.  Hydrate aggressively due to renal insufficiency.  Specific emphasis was placed on minimizing contrast.  Only 50 cc of contrast was used for the procedure.  Plan for discharge tomorrow.  Findings Coronary Findings Diagnostic  Dominance: Right  Left Anterior Descending Collaterals Dist LAD filled by collaterals from 2nd Diag.  Prox LAD lesion is 99% stenosed.  Right Coronary Artery Prox RCA lesion is 25% stenosed.  Intervention  Prox LAD lesion Stent CATH LAUNCHER 6FR EBU 3.5 SH guide catheter was inserted. Lesion crossed with guidewire using a WIRE RUNTHROUGH .[id number]. Pre-stent angioplasty was performed using a BALLN EMERGE MR 2.0X15. A drug-eluting stent was successfully placed using a SYNERGY XD 3.50X24. Stent strut is well apposed. Post-stent angioplasty was performed using a BALLN [place] EMERGE MR 4.0X8. Initially, 3.75 [place] balloon was used for postdilatation.  And 1 ectatic  segment of the LAD just after the lesion, there appeared to be some malapposition of the stent.  A 4.0  balloon was then used to dilate in that area. Post-Intervention Lesion Assessment The intervention was successful. Pre-interventional TIMI flow is 2. Post-intervention TIMI flow is 3. No complications occurred at this lesion. Ultrasound (IVUS) was performed on the lesion post PCI using a CATH OPTICROSS HD. Stent well expanded. There is a 0% residual stenosis post intervention.   STRESS TESTS  MYOCARDIAL PERFUSION IMAGING 06/23/2016  ECHOCARDIOGRAM  ECHOCARDIOGRAM COMPLETE 10/02/2022  Narrative ECHOCARDIOGRAM REPORT    Patient Name:   LUISMANUEL CORMAN Date of Exam: 10/02/2022 Medical Rec #:  [id number]       Height:       73.0 in Accession #:    [id number]      Weight:       185.7 lb Date of Birth:  10/29/1960       BSA:          2.085 m Patient Age:    61 years        BP:           131/93 mmHg Patient Gender: M               HR:           69 bpm. Exam Location:  Inpatient  Procedure: 2D Echo, Cardiac Doppler and Color Doppler  Indications:    CAD Native Vessel I25.10  History:        Patient has prior history of Echocardiogram examinations, most recent 05/26/2022. CAD, Pacemaker, CKD 3 and COPD; Risk Factors:Former Smoker, Dyslipidemia and Hypertension.  Sonographer:    Dondra Prader RVT RCS Referring Phys: [phone] MIHAI CROITORU  IMPRESSIONS   1. Left ventricular ejection fraction, by estimation, is 55 to 60%. Left ventricular ejection fraction by 3D volume is 57 %. The left ventricle has normal function. The left ventricle has no regional wall motion abnormalities. There is mild asymmetric left ventricular hypertrophy of the basal-septal segment. Left ventricular diastolic parameters are consistent with Grade I diastolic dysfunction (impaired relaxation). 2. Right ventricular systolic function is normal. The right ventricular size is normal. 3. The mitral valve is abnormal.  Mild mitral valve regurgitation. 4. The aortic valve is tricuspid. Aortic valve regurgitation is not visualized.  Comparison(s): Changes from prior study are noted. 05/26/2022: LVEF 40-45%, pardoxical septal motion, apical hypokinesis.  FINDINGS Left Ventricle: Left ventricular ejection fraction, by estimation, is 55 to 60%. Left ventricular ejection fraction by 3D volume is 57 %. The left ventricle has normal function. The left ventricle has no regional wall motion abnormalities. The left ventricular internal cavity size was normal in size. There is mild asymmetric left ventricular hypertrophy of the basal-septal segment. Abnormal (paradoxical) septal motion consistent with post-operative status. Left ventricular diastolic parameters are consistent with Grade I diastolic dysfunction (impaired relaxation). Indeterminate filling pressures.  Right Ventricle: The right ventricular size is normal. No increase in right ventricular wall thickness. Right ventricular systolic function is normal.  Left Atrium: Left atrial size was normal in size.  Right Atrium: Right atrial size was normal in size.  Pericardium: There is no evidence of pericardial effusion.  Mitral Valve: The mitral valve is abnormal. There is mild thickening of the anterior mitral valve leaflet(s). Mild mitral valve regurgitation.  Tricuspid Valve: The tricuspid valve is grossly normal. Tricuspid valve regurgitation is mild.  Aortic Valve: The aortic valve is tricuspid. Aortic valve regurgitation is not visualized. Aortic valve mean gradient measures 2.0 mmHg. Aortic valve peak gradient measures 3.7 mmHg. Aortic valve area, by VTI measures 2.46 cm.  Pulmonic Valve: The pulmonic valve was not well visualized. Pulmonic valve regurgitation is not visualized.  Aorta: The aortic root and ascending aorta are structurally normal, with no evidence of dilitation.  Venous: The inferior vena cava was not well visualized.  IAS/Shunts: No  atrial level shunt detected by color flow Doppler.   LEFT VENTRICLE PLAX 2D LVIDd:  4.00 cm         Diastology LVIDs:         2.60 cm         LV e' medial:    5.96 cm/s LV PW:         1.00 cm         LV E/e' medial:  9.1 LV IVS:        1.30 cm         LV e' lateral:   8.94 cm/s LVOT diam:     1.90 cm         LV E/e' lateral: 6.1 LV SV:         48 LV SV Index:   23 LVOT Area:     2.84 cm        3D Volume EF LV 3D EF:    Left ventricul ar ejection fraction by 3D volume is 57 %.  3D Volume EF: 3D EF:        57 % LV EDV:       119 ml LV ESV:       52 ml LV SV:        68 ml  RIGHT VENTRICLE RV S prime:     11.20 cm/s TAPSE (M-mode): 2.1 cm  LEFT ATRIUM             Index        RIGHT ATRIUM           Index LA diam:        3.30 cm 1.58 cm/m   RA Area:     15.50 cm LA Vol (A2C):   42.0 ml 20.15 ml/m  RA Volume:   36.30 ml  17.41 ml/m LA Vol (A4C):   46.2 ml 22.16 ml/m LA Biplane Vol: 46.4 ml 22.26 ml/m AORTIC VALVE                    PULMONIC VALVE AV Area (Vmax):    2.25 cm     PV Vmax:          0.87 m/s AV Area (Vmean):   2.17 cm     PV Peak grad:     3.1 mmHg AV Area (VTI):     2.46 cm     PR End Diast Vel: 4.24 msec AV Vmax:           96.40 cm/s AV Vmean:          65.000 cm/s AV VTI:            0.196 m AV Peak Grad:      3.7 mmHg AV Mean Grad:      2.0 mmHg LVOT Vmax:         76.40 cm/s LVOT Vmean:        49.700 cm/s LVOT VTI:          0.170 m LVOT/AV VTI ratio: 0.87  AORTA Ao Root diam: 3.50 cm Ao Asc diam:  3.20 cm  MITRAL VALVE               TRICUSPID VALVE MV Area (PHT): 2.68 cm    TR Peak grad:   32.9 mmHg MV Decel Time: 283 msec    TR Vmax:        287.00 cm/s MV E velocity: 54.50 cm/s MV A velocity: 62.90 cm/s  SHUNTS MV E/A ratio:  0.87        Systemic VTI:  0.17  m Systemic Diam: 1.90 cm  Zoila Shutter MD Electronically signed by Zoila Shutter MD Signature Date/Time: 10/02/2022/2:19:49 PM    Final    MONITORS  CARDIAC EVENT  MONITOR 06/23/2016          Recent Labs: 05/26/2022: B Natriuretic Peptide 62.9 10/04/2022: ALT 12 10/05/2022: BUN 28; Creatinine, Ser 2.14; Hemoglobin 12.9; Platelets 188; Potassium 4.5; Sodium 134  Recent Lipid Panel    Component Value Date/Time   CHOL 149 09/20/2020 0943   TRIG 119 09/20/2020 0943   HDL 26 (L) 09/20/2020 0943   CHOLHDL 5.7 (H) 09/20/2020 0943   CHOLHDL 6.8 11/20/2018 0432   VLDL 19 11/20/2018 0432   LDLCALC 101 (H) 09/20/2020 0943    History of Present Illness    ***  Home Medications    Current Outpatient Medications  Medication Sig Dispense Refill   amLODipine (NORVASC) 5 MG tablet Take 1 tablet (5 mg total) by mouth daily. 30 tablet 0   aspirin 81 MG chewable tablet Chew 1 tablet (81 mg total) by mouth daily. 30 tablet 3   Budeson-Glycopyrrol-Formoterol (BREZTRI AEROSPHERE) 160-9-4.8 MCG/ACT AERO Inhale 2 puffs into the lungs in the morning and at bedtime. 10.7 g 5   buPROPion (WELLBUTRIN SR) 150 MG 12 hr tablet Take 150 mg by mouth 2 (two) times daily.     cloNIDine (CATAPRES) 0.2 MG tablet Take 1 tablet (0.2 mg total) by mouth 3 (three) times daily. 270 tablet 3   clopidogrel (PLAVIX) 75 MG tablet Take 1 tablet (75 mg total) by mouth daily. (Patient taking differently: Take 75 mg by mouth 2 (two) times daily.) 90 tablet 3   ezetimibe (ZETIA) 10 MG tablet Take 1 tablet (10 mg total) by mouth daily. 90 tablet 3   hydrALAZINE (APRESOLINE) 100 MG tablet Take 1 tablet (100 mg total) by mouth 3 (three) times daily. 270 tablet 1   isosorbide dinitrate (ISORDIL) 30 MG tablet TAKE 1 TABLET(30 MG) BY MOUTH THREE TIMES DAILY (Patient taking differently: Take 30 mg by mouth 3 (three) times daily.) 270 tablet 0   metoprolol succinate (TOPROL-XL) 100 MG 24 hr tablet Take 1 tablet (100 mg total) by mouth 2 (two) times daily. Take with or immediately following a meal. 60 tablet 2   montelukast (SINGULAIR) 10 MG tablet Take 1 tablet (10 mg total) by mouth at bedtime. 90  tablet 1   nitroGLYCERIN (NITROSTAT) 0.4 MG SL tablet Place 0.4 mg under the tongue every 5 (five) minutes as needed for chest pain.     paliperidone (INVEGA) 6 MG 24 hr tablet Take 6 mg by mouth at bedtime.     pantoprazole (PROTONIX) 40 MG tablet TAKE 1 TABLET(40 MG) BY MOUTH TWICE DAILY (Patient taking differently: Take 40 mg by mouth 2 (two) times daily.) 60 tablet 5   Spacer/Aero-Holding [facility] Use as directed with inhaler. 1 each 0   sucralfate (CARAFATE) 1 GM/10ML suspension Take 10 mLs (1 g total) by mouth 4 (four) times daily -  with meals and at bedtime. (Patient taking differently: Take 1 g by mouth daily as needed (for ulcers).) 3600 mL 3   No current facility-administered medications for this visit.   Facility-Administered Medications Ordered in Other Visits  Medication Dose Route Frequency Provider Last Rate Last Admin   acetaminophen (OFIRMEV) IVPB    PRN Illene Silver, CRNA   1,000 mg at 04/19/11 0910   glycopyrrolate (ROBINUL) injection    PRN Illene Silver, CRNA   0.8 mg at  04/19/11 1037     Review of Systems    ***.  All other systems reviewed and are otherwise negative except as noted above. {The patient has an active order for outpatient cardiac rehabilitation.   Please indicate if the patient is ready to start. Do NOT delete this.  It will auto delete.  Refresh note, then sign.              Click here to document readiness and see contraindications.  :1}  Cardiac Rehabilitation Eligibility Assessment      Physical Exam    VS:  There were no vitals taken for this visit. , BMI There is no height or weight on file to calculate BMI.     GEN: Well nourished, well developed, in no acute distress. HEENT: normal. Neck: Supple, no JVD, carotid bruits, or masses. Cardiac: RRR, no murmurs, rubs, or gallops. No clubbing, cyanosis, edema.  Radials/DP/PT 2+ and equal bilaterally.  Respiratory:  Respirations regular and unlabored, clear to auscultation  bilaterally. GI: Soft, nontender, nondistended, BS + x 4. MS: no deformity or atrophy. Skin: warm and dry, no rash. Neuro:  Strength and sensation are intact. Psych: Normal affect.  Accessory Clinical Findings    ECG personally reviewed by me today -    - no acute changes.   Lab Results  Component Value Date   WBC 6.6 10/05/2022   HGB 12.9 (L) 10/05/2022   HCT 38.6 (L) 10/05/2022   MCV 86.9 10/05/2022   PLT 188 10/05/2022   Lab Results  Component Value Date   CREATININE 2.14 (H) 10/05/2022   BUN 28 (H) 10/05/2022   NA 134 (L) 10/05/2022   K 4.5 10/05/2022   CL 103 10/05/2022   CO2 21 (L) 10/05/2022   Lab Results  Component Value Date   ALT 12 10/04/2022   AST 16 10/04/2022   ALKPHOS 58 10/04/2022   BILITOT 0.9 10/04/2022   Lab Results  Component Value Date   CHOL 149 09/20/2020   HDL 26 (L) 09/20/2020   LDLCALC 101 (H) 09/20/2020   TRIG 119 09/20/2020   CHOLHDL 5.7 (H) 09/20/2020    Lab Results  Component Value Date   HGBA1C 5.5 09/08/2019    Assessment & Plan    1.  ***  No BP recorded.  {Refresh Note OR Click here to enter BP  :1}***   Rip Harbour, NP 11/14/2022, 2:20 PM

## 2022-11-15 ENCOUNTER — Other Ambulatory Visit: Payer: Self-pay

## 2022-11-15 ENCOUNTER — Encounter: Payer: Self-pay | Admitting: General Practice

## 2022-11-15 ENCOUNTER — Ambulatory Visit: Payer: 59 | Attending: General Practice | Admitting: Cardiology

## 2022-11-15 VITALS — BP 116/76 | HR 73 | Ht 73.0 in | Wt 189.6 lb

## 2022-11-15 DIAGNOSIS — I480 Paroxysmal atrial fibrillation: Secondary | ICD-10-CM

## 2022-11-15 DIAGNOSIS — I2510 Atherosclerotic heart disease of native coronary artery without angina pectoris: Secondary | ICD-10-CM

## 2022-11-15 DIAGNOSIS — I214 Non-ST elevation (NSTEMI) myocardial infarction: Secondary | ICD-10-CM

## 2022-11-15 DIAGNOSIS — Z950 Presence of cardiac pacemaker: Secondary | ICD-10-CM

## 2022-11-15 DIAGNOSIS — E785 Hyperlipidemia, unspecified: Secondary | ICD-10-CM | POA: Diagnosis not present

## 2022-11-15 DIAGNOSIS — I10 Essential (primary) hypertension: Secondary | ICD-10-CM | POA: Diagnosis not present

## 2022-11-15 DIAGNOSIS — I714 Abdominal aortic aneurysm, without rupture, unspecified: Secondary | ICD-10-CM

## 2022-11-15 DIAGNOSIS — I255 Ischemic cardiomyopathy: Secondary | ICD-10-CM

## 2022-11-15 DIAGNOSIS — I447 Left bundle-branch block, unspecified: Secondary | ICD-10-CM

## 2022-11-15 DIAGNOSIS — N184 Chronic kidney disease, stage 4 (severe): Secondary | ICD-10-CM

## 2022-11-15 NOTE — Patient Instructions (Signed)
Medication Instructions:  The current medical regimen is effective;  continue present plan and medications as directed. Please refer to the Current Medication list given to you today.  *If you need a refill on your cardiac medications before your next appointment, please call your pharmacy*  Lab Work: NONE If you have labs (blood work) drawn today and your tests are completely normal, you will receive your results only by:  MyChart Message (if you have MyChart) OR  A paper copy in the mail If you have any lab test that is abnormal or we need to change your treatment, we will call you to review the results.  Other Instructions CALL [facility] [place] TO DISCUSS GALLBLADDER  Follow-Up: At [facility], you and your health needs are our priority.  As part of our continuing mission to provide you with exceptional heart care, we have created designated Provider Care Teams.  These Care Teams include your primary Cardiologist (physician) and Advanced Practice Providers (APPs -  Physician Assistants and Nurse Practitioners) who all work together to provide you with the care you need, when you need it.  Your next appointment:   4-6 month(s)  Provider:   Reatha Harps, MD

## 2022-11-16 ENCOUNTER — Emergency Department (HOSPITAL_COMMUNITY): Payer: 59

## 2022-11-16 ENCOUNTER — Emergency Department (HOSPITAL_COMMUNITY)
Admission: EM | Admit: 2022-11-16 | Discharge: 2022-11-16 | Disposition: A | Discharge status: Home / Self Care | Payer: 59 | Attending: Emergency Medicine | Admitting: Emergency Medicine

## 2022-11-16 ENCOUNTER — Other Ambulatory Visit: Payer: Self-pay

## 2022-11-16 DIAGNOSIS — I5032 Chronic diastolic (congestive) heart failure: Secondary | ICD-10-CM | POA: Insufficient documentation

## 2022-11-16 DIAGNOSIS — J449 Chronic obstructive pulmonary disease, unspecified: Secondary | ICD-10-CM | POA: Insufficient documentation

## 2022-11-16 DIAGNOSIS — Z79899 Other long term (current) drug therapy: Secondary | ICD-10-CM | POA: Insufficient documentation

## 2022-11-16 DIAGNOSIS — Z8616 Personal history of COVID-19: Secondary | ICD-10-CM | POA: Insufficient documentation

## 2022-11-16 DIAGNOSIS — J4520 Mild intermittent asthma, uncomplicated: Secondary | ICD-10-CM | POA: Insufficient documentation

## 2022-11-16 DIAGNOSIS — Z87891 Personal history of nicotine dependence: Secondary | ICD-10-CM | POA: Diagnosis not present

## 2022-11-16 DIAGNOSIS — E1122 Type 2 diabetes mellitus with diabetic chronic kidney disease: Secondary | ICD-10-CM | POA: Insufficient documentation

## 2022-11-16 DIAGNOSIS — J45909 Unspecified asthma, uncomplicated: Secondary | ICD-10-CM | POA: Diagnosis not present

## 2022-11-16 DIAGNOSIS — Z7982 Long term (current) use of aspirin: Secondary | ICD-10-CM | POA: Insufficient documentation

## 2022-11-16 DIAGNOSIS — I130 Hypertensive heart and chronic kidney disease with heart failure and stage 1 through stage 4 chronic kidney disease, or unspecified chronic kidney disease: Secondary | ICD-10-CM | POA: Diagnosis not present

## 2022-11-16 DIAGNOSIS — Z950 Presence of cardiac pacemaker: Secondary | ICD-10-CM | POA: Insufficient documentation

## 2022-11-16 DIAGNOSIS — Z7902 Long term (current) use of antithrombotics/antiplatelets: Secondary | ICD-10-CM | POA: Diagnosis not present

## 2022-11-16 DIAGNOSIS — K8050 Calculus of bile duct without cholangitis or cholecystitis without obstruction: Secondary | ICD-10-CM

## 2022-11-16 DIAGNOSIS — K8020 Calculus of gallbladder without cholecystitis without obstruction: Secondary | ICD-10-CM | POA: Diagnosis not present

## 2022-11-16 DIAGNOSIS — N184 Chronic kidney disease, stage 4 (severe): Secondary | ICD-10-CM | POA: Insufficient documentation

## 2022-11-16 DIAGNOSIS — R1011 Right upper quadrant pain: Secondary | ICD-10-CM | POA: Diagnosis present

## 2022-11-16 LAB — COMPREHENSIVE METABOLIC PANEL
ALT: 14 U/L (ref 0–44)
AST: 17 U/L (ref 15–41)
Albumin: 3.7 g/dL (ref 3.5–5.0)
Alkaline Phosphatase: 67 U/L (ref 38–126)
Anion gap: 9 (ref 5–15)
BUN: 31 mg/dL — ABNORMAL HIGH (ref 8–23)
CO2: 23 mmol/L (ref 22–32)
Calcium: 9 mg/dL (ref 8.9–10.3)
Chloride: 107 mmol/L (ref 98–111)
Creatinine, Ser: 2.4 mg/dL — ABNORMAL HIGH (ref 0.61–1.24)
GFR, Estimated: 30 mL/min — ABNORMAL LOW (ref >60)
Glucose, Bld: 61 mg/dL — ABNORMAL LOW (ref 70–99)
Potassium: 4 mmol/L (ref 3.5–5.1)
Sodium: 139 mmol/L (ref 135–145)
Total Bilirubin: 0.4 mg/dL (ref 0.3–1.2)
Total Protein: 6.7 g/dL (ref 6.5–8.1)

## 2022-11-16 LAB — CBC WITH DIFFERENTIAL/PLATELET
Abs Immature Granulocytes: 0.08 10*3/uL — ABNORMAL HIGH (ref 0.00–0.07)
Basophils Absolute: 0.1 10*3/uL (ref 0.0–0.1)
Basophils Relative: 0 %
Eosinophils Absolute: 0.6 10*3/uL — ABNORMAL HIGH (ref 0.0–0.5)
Eosinophils Relative: 5 %
HCT: 43.2 % (ref 39.0–52.0)
Hemoglobin: 14.2 g/dL (ref 13.0–17.0)
Immature Granulocytes: 1 %
Lymphocytes Relative: 30 %
Lymphs Abs: 3.6 10*3/uL (ref 0.7–4.0)
MCH: 28.5 pg (ref 26.0–34.0)
MCHC: 32.9 g/dL (ref 30.0–36.0)
MCV: 86.6 fL (ref 80.0–100.0)
Monocytes Absolute: 1.5 10*3/uL — ABNORMAL HIGH (ref 0.1–1.0)
Monocytes Relative: 13 %
Neutro Abs: 6 10*3/uL (ref 1.7–7.7)
Neutrophils Relative %: 51 %
Platelets: 240 10*3/uL (ref 150–400)
RBC: 4.99 MIL/uL (ref 4.22–5.81)
RDW: 12.5 % (ref 11.5–15.5)
WBC: 11.9 10*3/uL — ABNORMAL HIGH (ref 4.0–10.5)
nRBC: 0 % (ref 0.0–0.2)

## 2022-11-16 LAB — URINALYSIS, ROUTINE W REFLEX MICROSCOPIC
Bacteria, UA: NONE SEEN
Bilirubin Urine: NEGATIVE
Glucose, UA: NEGATIVE mg/dL
Hgb urine dipstick: NEGATIVE
Ketones, ur: NEGATIVE mg/dL
Leukocytes,Ua: NEGATIVE
Nitrite: NEGATIVE
Protein, ur: 30 mg/dL — AB
Specific Gravity, Urine: 1.018 (ref 1.005–1.030)
pH: 5 (ref 5.0–8.0)

## 2022-11-16 LAB — CBG MONITORING, ED: Glucose-Capillary: 92 mg/dL (ref 70–99)

## 2022-11-16 LAB — LIPASE, BLOOD: Lipase: 33 U/L (ref 11–51)

## 2022-11-16 MED ORDER — SODIUM CHLORIDE 0.9 % IV BOLUS
1000.0000 mL | Freq: Once | INTRAVENOUS | Status: AC
  Administered 2022-11-16: 1000 mL via INTRAVENOUS

## 2022-11-16 MED ORDER — MORPHINE SULFATE (PF) 4 MG/ML IV SOLN
4.0000 mg | Freq: Once | INTRAVENOUS | Status: AC
  Administered 2022-11-16: 4 mg via INTRAVENOUS
  Filled 2022-11-16: qty 1

## 2022-11-16 MED ORDER — ONDANSETRON HCL 4 MG PO TABS: 4.0000 mg | ORAL_TABLET | Freq: Three times a day (TID) | ORAL | 0 refills | Status: DC | PRN

## 2022-11-16 MED ORDER — ONDANSETRON HCL 4 MG/2ML IJ SOLN
4.0000 mg | Freq: Once | INTRAMUSCULAR | Status: AC
  Administered 2022-11-16: 4 mg via INTRAVENOUS
  Filled 2022-11-16: qty 2

## 2022-11-16 MED ORDER — OXYCODONE HCL 5 MG PO TABS: 5.0000 mg | ORAL_TABLET | ORAL | 0 refills | Status: DC | PRN

## 2022-11-16 NOTE — ED Notes (Signed)
Taken for U/S at this time.

## 2022-11-16 NOTE — ED Triage Notes (Signed)
Pt via EMS for diffuse abdominal pain onset this am. States had an MI in January and had been taking Eliquis for past year. Has gallbladder inflammation and gallstone. Reports was told he needed to wait to get gallbladder removed. Had GI consult for tmrw. Denies n/v, reporting diarrhea.

## 2022-11-16 NOTE — ED Provider Notes (Signed)
EMERGENCY DEPARTMENT AT [facility] Provider Note  CSN: [id number] Arrival date & time: 11/16/22 2014  Chief Complaint(s) Abdominal Pain  HPI Peter Marsh is a 62 y.o. male history of schizophrenia, CKD, COPD, diabetes, coronary artery disease, permanent pacemaker presenting with right upper quadrant pain.  Patient reports abdominal pain started today, associated with some nausea.  Does seem to get worse with trying to eat.  He also reports some diarrhea.  No hematochezia, hematemesis.  No vomiting.  Reports chills at home. No fevers.  He reports no urinary symptoms.  His symptoms were overall similar to when he was hospitalized for cholecystitis.  At that time was unable to have a cholecystectomy due to his DAPT  Past Medical History Past Medical History:  Diagnosis Date   Allergy    Anxiety    Asthma    uses inhalers    Bilateral carotid bruits    Cardiac conduction disorder 2018   s/p MDT PPM   Cataract    Chest pain 06/24/2018   Chronic kidney disease    bladder interstial cystitis    Chronic kidney disease (CKD), stage IV (severe) ([name])    followed by Dr. Marisue Humble at [facility]   COPD (chronic obstructive pulmonary disease) ([name])    Coronary artery disease    COVID-19 virus infection 10/21/2020   Depression    Diabetes mellitus without complication ([name])    Discord with neighbors, lodgers and landlord 11/26/2021   Encounter for care of pacemaker 02/13/2019   GERD (gastroesophageal reflux disease)    Hematemesis 09/16/2016   History of stomach ulcers 2001   Hypertension    LBBB (left bundle branch block)    Lower extremity edema    Mild intermittent asthma without complication    Mixed hyperlipidemia    Mobitz type 2 second degree AV block 04/06/2019   Neuropathy    Pacemaker: Medtronic Azure XT DR MRI [id number] PPM -  BUNDLE OF HIS pacing  02/26/2017   Scheduled Remote pacemaker check  11/12/2018:  There were 24 Fast AV episodes:  EGMs show  SVTs. Episodes lasted < 2 minutes. Health trends do not demonstrate significant abnormality. Battery longevity is 9.4 - 10.3 years. RA pacing is 47.1 %, RV pacing is 40.4 %.  Clinic check 11/07/17.    Paroxysmal atrial flutter ([name])    PONV (postoperative nausea and vomiting)    Prostatitis    Recurrent upper respiratory infection (URI)    Schizophrenia ([name])    Sinus node dysfunction ([name]) 02/13/2019   Urticaria    Patient Active Problem List   Diagnosis Date Noted   Acute cholecystitis 10/01/2022   Chronic diastolic CHF (congestive heart failure) ([name]) 10/01/2022   Coronary artery disease involving native coronary artery of native heart without angina pectoris 06/27/2022   Myalgia due to statin 06/27/2022   NSTEMI (non-ST elevated myocardial infarction) ([name]) 05/26/2022   Hypertensive urgency 05/26/2022   Paroxysmal atrial flutter ([name]) 05/26/2022   Seasonal and perennial allergic rhinitis 12/09/2020   SBO (small bowel obstruction) ([name]) 10/22/2020   Ileus ([name]) 10/21/2020   Aneurysm of infrarenal abdominal aorta ([name]) 10/21/2020   Allergic rhinitis due to pollen 09/22/2020   Asthma-COPD overlap syndrome 09/22/2020   Chronic pancreatitis ([name]) 09/22/2020   Gastric ulcer, unspecified as acute or chronic, without hemorrhage or perforation 09/22/2020   Hyperglycemia 09/22/2020   Left bundle branch block 09/22/2020   Schizoaffective disorder, bipolar type ([name]) 09/22/2020   Testicular hypofunction 09/22/2020   Barrett's esophagus  09/22/2020   Psychotic disorder ([name]) 09/22/2020   Hepatitis C antibody positive in blood 09/22/2020   Hypertensive heart and chronic kidney disease with heart failure and stage 1 through stage 4 chronic kidney disease, or chronic kidney disease ([name]) 09/20/2020   Gait abnormality 10/22/2019   Vitamin B12 deficiency 10/22/2019   Idiopathic peripheral neuropathy 10/22/2019   Paresthesia 09/08/2019   Mobitz type 2 second degree AV block 04/06/2019   Sinus  node dysfunction ([name]) 02/13/2019   GERD (gastroesophageal reflux disease) 11/19/2018   Essential hypertension 06/24/2018   Wide QRS ventricular tachycardia ([name]) 02/26/2017   Pacemaker: Medtronic Azure XT DR MRI [id number] PPM -  BUNDLE OF HIS pacing  02/26/2017   Chronic kidney disease, stage 3b ([name]) 09/16/2016   Schizophrenia ([name]) 09/16/2016   Herniated lumbar intervertebral disc 04/20/2011   Hyperlipidemia 04/20/2009   Gastroparesis 11/24/2008   History of tobacco abuse 08/14/2008   Low back pain 11/15/2007   Home Medication(s) Prior to Admission medications   Medication Sig Start Date End Date Taking? Authorizing Provider  amLODipine (NORVASC) 5 MG tablet Take 1 tablet (5 mg total) by mouth daily. 10/05/22 11/04/22  Arrien, York Ram, MD  aspirin 81 MG chewable tablet Chew 1 tablet (81 mg total) by mouth daily. 05/28/22   Osvaldo Shipper, MD  Budeson-Glycopyrrol-Formoterol (BREZTRI AEROSPHERE) 160-9-4.8 MCG/ACT AERO Inhale 2 puffs into the lungs in the morning and at bedtime. 12/09/20   Hetty Blend, FNP  buPROPion (WELLBUTRIN SR) 150 MG 12 hr tablet Take 150 mg by mouth 2 (two) times daily.    [provider]  cloNIDine (CATAPRES) 0.2 MG tablet Take 1 tablet (0.2 mg total) by mouth 3 (three) times daily. 09/19/22   O'NealRonnald Ramp, MD  clopidogrel (PLAVIX) 75 MG tablet Take 1 tablet (75 mg total) by mouth daily. Patient taking differently: Take 75 mg by mouth 2 (two) times daily. 08/21/22 08/21/23  O'NealRonnald Ramp, MD  ezetimibe (ZETIA) 10 MG tablet Take 1 tablet (10 mg total) by mouth daily. 07/28/22   O'NealRonnald Ramp, MD  hydrALAZINE (APRESOLINE) 100 MG tablet Take 1 tablet (100 mg total) by mouth 3 (three) times daily. 09/19/22   O'NealRonnald Ramp, MD  isosorbide dinitrate (ISORDIL) 30 MG tablet TAKE 1 TABLET(30 MG) BY MOUTH THREE TIMES DAILY Patient taking differently: Take 30 mg by mouth 3 (three) times daily. 09/19/22   Ronney Asters, NP  metoprolol  succinate (TOPROL-XL) 100 MG 24 hr tablet Take 1 tablet (100 mg total) by mouth 2 (two) times daily. Take with or immediately following a meal. 05/27/22   Osvaldo Shipper, MD  montelukast (SINGULAIR) 10 MG tablet Take 1 tablet (10 mg total) by mouth at bedtime. 12/09/20 05/27/23  Hetty Blend, FNP  nitroGLYCERIN (NITROSTAT) 0.4 MG SL tablet Place 0.4 mg under the tongue every 5 (five) minutes as needed for chest pain. Patient not taking: Reported on 11/15/2022    [provider]  ondansetron (ZOFRAN) 4 MG tablet Take 1 tablet (4 mg total) by mouth every 8 (eight) hours as needed for nausea or vomiting. 11/16/22   Lonell Grandchild, MD  oxyCODONE (ROXICODONE) 5 MG immediate release tablet Take 1 tablet (5 mg total) by mouth every 4 (four) hours as needed for severe pain. 11/16/22   Lonell Grandchild, MD  paliperidone (INVEGA) 6 MG 24 hr tablet Take 6 mg by mouth at bedtime.    [provider]  pantoprazole (PROTONIX) 40 MG tablet TAKE 1 TABLET(40 MG)  BY MOUTH TWICE DAILY Patient taking differently: Take 40 mg by mouth 2 (two) times daily. 07/06/21   Tressia Danas, MD  Spacer/Aero-Holding Rudean Curt Use as directed with inhaler. 07/12/21   Nehemiah Settle, FNP  sucralfate (CARAFATE) 1 GM/10ML suspension Take 10 mLs (1 g total) by mouth 4 (four) times daily -  with meals and at bedtime. Patient taking differently: Take 1 g by mouth daily as needed (for ulcers). 10/25/20   Leroy Sea, MD                                                                                                                                    Past Surgical History Past Surgical History:  Procedure Laterality Date   ADENOIDECTOMY     APPENDECTOMY     COLONOSCOPY     CORONARY STENT INTERVENTION N/A 05/26/2022   Procedure: CORONARY STENT INTERVENTION;  Surgeon: Corky Crafts, MD;  Location: [facility] INVASIVE CV LAB;  Service: Cardiovascular;  Laterality: N/A;   CORONARY ULTRASOUND/IVUS N/A 05/26/2022    Procedure: Intravascular Ultrasound/IVUS;  Surgeon: Corky Crafts, MD;  Location: [facility] INVASIVE CV LAB;  Service: Cardiovascular;  Laterality: N/A;   ELECTROPHYSIOLOGY STUDY N/A 02/26/2017   Procedure: ELECTROPHYSIOLOGY STUDY;  Surgeon: Marinus Maw, MD;  Location: [facility] INVASIVE CV LAB;  Service: Cardiovascular;  Laterality: N/A;   ESOPHAGOGASTRODUODENOSCOPY (EGD) WITH PROPOFOL N/A 09/18/2016   Procedure: ESOPHAGOGASTRODUODENOSCOPY (EGD) WITH PROPOFOL;  Surgeon: Kerin Salen, MD;  Location: [facility] ENDOSCOPY;  Service: Gastroenterology;  Laterality: N/A;   LEFT HEART CATH AND CORONARY ANGIOGRAPHY N/A 05/26/2022   Procedure: LEFT HEART CATH AND CORONARY ANGIOGRAPHY;  Surgeon: Corky Crafts, MD;  Location: [facility] INVASIVE CV LAB;  Service: Cardiovascular;  Laterality: N/A;   LUMBAR LAMINECTOMY/DECOMPRESSION MICRODISCECTOMY  04/19/2011   Procedure: LUMBAR LAMINECTOMY/DECOMPRESSION MICRODISCECTOMY;  Surgeon: Jacki Cones;  Location: [facility] ORS;  Service: Orthopedics;  Laterality: Left;  Hemi LAminectomy/Microdiscectomy Lumbar four  - Lumbar five  on the Left (X-Ray)   PACEMAKER IMPLANT N/A 02/26/2017   Procedure: PACEMAKER IMPLANT;  Surgeon: Marinus Maw, MD;  Location: [facility] INVASIVE CV LAB;  Service: Cardiovascular;  Laterality: N/A;   TONSILLECTOMY     UPPER GASTROINTESTINAL ENDOSCOPY     Family History Family History  Problem Relation Age of Onset   High blood pressure Mother    Alzheimer's disease Father    Heart attack Brother    Colon cancer Neg Hx    Esophageal cancer Neg Hx    Rectal cancer Neg Hx    Stomach cancer Neg Hx     Social History Social History   Tobacco Use   Smoking status: Former    Current packs/day: 0.00    Average packs/day: 1 pack/day for 30.0 years (30.0 ttl pk-yrs)    Types: Cigarettes    Start date: 05/08/1978    Quit date: 05/08/2008    Years since quitting: 14.5   Smokeless tobacco:  Never  Vaping Use   Vaping status: Never Used  Substance Use Topics    Alcohol use: No   Drug use: No   Allergies Methylpyrrolidone, Niacin, Nsaids, Ace inhibitors, Norvasc [amlodipine besylate], Other, Oxybutynin chloride [oxybutynin chloride er], Solifenacin succinate, Statins, Vesicare [solifenacin succinate], Ciprofloxacin, Oxybutynin, and Solifenacin  Review of Systems Review of Systems  All other systems reviewed and are negative.   Physical Exam Vital Signs  I have reviewed the triage vital signs BP (!) 162/92 (BP Location: Right Arm)   Pulse 65   Temp 98.2 F (36.8 C) (Oral)   Ht 6\' 1"  (1.854 m)   Wt 86.2 kg   SpO2 98%   BMI 25.07 kg/m  Physical Exam Vitals and nursing note reviewed.  Constitutional:      General: He is not in acute distress.    Appearance: Normal appearance.  HENT:     Mouth/Throat:     Mouth: Mucous membranes are moist.  Eyes:     Conjunctiva/sclera: Conjunctivae normal.  Cardiovascular:     Rate and Rhythm: Normal rate and regular rhythm.  Pulmonary:     Effort: Pulmonary effort is normal. No respiratory distress.     Breath sounds: Normal breath sounds.  Abdominal:     General: Abdomen is flat.     Palpations: Abdomen is soft.     Tenderness: There is abdominal tenderness in the right upper quadrant.  Musculoskeletal:     Right lower leg: No edema.     Left lower leg: No edema.  Skin:    General: Skin is warm and dry.     Capillary Refill: Capillary refill takes less than 2 seconds.  Neurological:     Mental Status: He is alert and oriented to person, place, and time. Mental status is at baseline.  Psychiatric:        Mood and Affect: Mood normal.        Behavior: Behavior normal.     ED Results and Treatments Labs (all labs ordered are listed, but only abnormal results are displayed) Labs Reviewed  COMPREHENSIVE METABOLIC PANEL - Abnormal; Notable for the following components:      Result Value   Glucose, Bld 61 (*)    BUN 31 (*)    Creatinine, Ser 2.40 (*)    GFR, Estimated 30 (*)    All  other components within normal limits  CBC WITH DIFFERENTIAL/PLATELET - Abnormal; Notable for the following components:   WBC 11.9 (*)    Monocytes Absolute 1.5 (*)    Eosinophils Absolute 0.6 (*)    Abs Immature Granulocytes 0.08 (*)    All other components within normal limits  URINALYSIS, ROUTINE W REFLEX MICROSCOPIC - Abnormal; Notable for the following components:   Protein, ur 30 (*)    All other components within normal limits  LIPASE, BLOOD  CBG MONITORING, ED  Radiology US Abdomen Limited RUQ (LIVER/GB)  Addendum Date: 11/16/2022   ADDENDUM REPORT: 11/16/2022 23:18 ADDENDUM: Please note the echogenic focus in the gallbladder likely represents a stone as it appears somewhat calcified on CT. Electronically Signed   By: Elgie Collard M.D.   On: 11/16/2022 23:18   Result Date: 11/16/2022 CLINICAL DATA:  Right upper quadrant abdominal pain. EXAM: ULTRASOUND ABDOMEN LIMITED RIGHT UPPER QUADRANT COMPARISON:  Ultrasound dated 10/03/2022. FINDINGS: Evaluation is very limited due to significant overlying bowel gas. Gallbladder: There is a 9 mm nonmobile and nonshadowing echogenic focus in the gallbladder which may represent an adherent sludge versus possibly a polyp. No shadowing stone. The gallbladder wall is mildly thickened measuring 5 mm, likely related to underdistention. No pericholecystic fluid. Negative sonographic Murphy's sign. Common bile duct: Diameter: 4 mm Liver: The liver is poorly visualized due to overlying bowel gas. The visualized liver is unremarkable. Portal vein is patent on color Doppler imaging with normal direction of blood flow towards the liver. Other: Partially and poorly visualized right renal cyst, better seen on the prior CT and ultrasound. IMPRESSION: Adherent sludge versus a 9 mm polyp in the gallbladder. Attention on follow-up imaging  recommended. No shadowing stone or sonographic findings of acute cholecystitis. Electronically Signed: By: Elgie Collard M.D. On: 11/16/2022 21:44   CT ABDOMEN WO CONTRAST  Result Date: 11/16/2022 CLINICAL DATA:  Right upper quadrant abdominal pain. EXAM: CT ABDOMEN WITHOUT CONTRAST TECHNIQUE: Multidetector CT imaging of the abdomen was performed following the standard protocol without IV contrast. RADIATION DOSE REDUCTION: This exam was performed according to the departmental dose-optimization program which includes automated exposure control, adjustment of the mA and/or kV according to patient size and/or use of iterative reconstruction technique. COMPARISON:  Abdominal ultrasound dated 11/16/2022 and CT abdomen pelvis dated 10/01/2022. FINDINGS: Evaluation of this exam is limited in the absence of intravenous contrast. Lower chest: The visualized lung bases are clear. Cardiac pacemaker wires noted. No intra-abdominal free air or free fluid. Hepatobiliary: The liver is grossly unremarkable. No biliary dilatation. There is a gallstone as seen on the prior CT. No pericholecystic fluid or evidence of acute cholecystitis by CT. Pancreas: Small scattered pancreatic calcifications sequela of chronic pancreatitis. No active inflammatory changes. No dilatation of the main pancreatic duct. Spleen: Normal in size without focal abnormality. Adrenals/Urinary Tract: The adrenal glands are unremarkable. Similar appearance of bilateral renal cysts better evaluated on the prior contrast enhanced CT. Several subcentimeter renal hypodense lesions are not characterized on this CT. Nonobstructing bilateral renal calculi measure up to 12 mm in the upper pole of the right kidney. Large right renal parapelvic cyst versus less likely hydronephrosis. There is no hydronephrosis or obstructing stone on the left. The visualized ureters and urinary bladder appear unremarkable. Stomach/Bowel: There is interposition of the distal stomach  between the transverse colon and anterior abdominal wall. There is narrowing of the distal stomach as it passes over the transverse colon. There is dilatation of the stomach proximal to this area. A degree of luminal narrowing due to compression of distal stomach by the colon is not excluded. There is no small bowel obstruction or active inflammation. The appendix is not visualized with certainty. No inflammatory changes identified in the right lower quadrant. Vascular/Lymphatic: Moderate aortoiliac atherosclerotic disease. There is a 4 cm fusiform infrarenal abdominal aortic aneurysm. The IVC is unremarkable. No portal venous gas. There is no adenopathy. Reproductive: The prostate and seminal vesicles are grossly unremarkable. No pelvic mass. Other: None Musculoskeletal: Degenerative changes  of the spine. No acute osseous pathology. IMPRESSION: 1. Interposition of the distal stomach between the transverse colon and anterior abdominal wall. There is narrowing of the distal stomach as it passes over the transverse colon. A degree of luminal narrowing due to compression of the distal stomach by the colon is not excluded. 2. Cholelithiasis. 3. Nonobstructing bilateral renal calculi. Similar appearance of large right renal parapelvic cyst versus less likely hydronephrosis. 4. A 4 cm fusiform infrarenal abdominal aortic aneurysm. Recommend follow-up every 12 months and vascular consultation. This recommendation follows ACR consensus guidelines: White Paper of the ACR Incidental Findings Committee II on Vascular Findings. J Am Coll Radiol 2013; 86:578-469. 5.  Aortic Atherosclerosis (ICD10-I70.0). Electronically Signed   By: Elgie Collard M.D.   On: 11/16/2022 23:16    Pertinent labs & imaging results that were available during my care of the patient were reviewed by me and considered in my medical decision making (see MDM for details).  Medications Ordered in ED Medications  morphine (PF) 4 MG/ML injection 4 mg  (4 mg Intravenous Given 11/16/22 2103)  ondansetron (ZOFRAN) injection 4 mg (4 mg Intravenous Given 11/16/22 2103)  sodium chloride 0.9 % bolus 1,000 mL (0 mLs Intravenous Stopped 11/16/22 2207)  ondansetron (ZOFRAN) injection 4 mg (4 mg Intravenous Given 11/16/22 2336)                                                                                                                                     Procedures Procedures  (including critical care time)  Medical Decision Making / ED Course   MDM:  62 year old male presenting to the emergency department with abdominal pain  On exam, patient has right upper quadrant tenderness.  Given clinical history, some concern for cholecystitis.  Will obtain right upper quadrant ultrasound as well as laboratory testing.  No tenderness to the remainder of his abdomen to suggest alternative cause such as obstruction, perforation, volvulus, appendicitis.  If his ultrasound is unrevealing we will likely pursue further imaging including CT scan.  He denies any specific urinary symptoms but given age will also check urinalysis.  Clinical Course as of 11/16/22 2341  Thu Nov 16, 2022  2338 Discussed with Dr. Sheliah Hatch, recommends outpatient follow up since he has an appointment tomorrow. Patient is agreeable and feels better. Prescribed medication. [place] Controlled Substance Reporting System database was reviewed. and patient was instructed, not to drive, operate heavy machinery, perform activities at heights, swimming or participation in water activities or provide baby-sitting services while on Pain, Sleep and Anxiety Medications; until their outpatient Physician has advised to do so again. Also recommended to not to take more than prescribed Pain, Sleep and Anxiety Medications.   Will discharge patient to home. All questions answered. Patient comfortable with plan of discharge. Return precautions discussed with patient and specified on the after visit  summary.  [WS]    Clinical Course User Index [WS]  Lonell Grandchild, MD     Additional history obtained: -Additional history obtained from family -External records from outside source obtained and reviewed including: Chart review including previous notes, labs, imaging, consultation notes including d/c from recent hospitalization   Lab Tests: -I ordered, reviewed, and interpreted labs.   The pertinent results include:   Labs Reviewed  COMPREHENSIVE METABOLIC PANEL - Abnormal; Notable for the following components:      Result Value   Glucose, Bld 61 (*)    BUN 31 (*)    Creatinine, Ser 2.40 (*)    GFR, Estimated 30 (*)    All other components within normal limits  CBC WITH DIFFERENTIAL/PLATELET - Abnormal; Notable for the following components:   WBC 11.9 (*)    Monocytes Absolute 1.5 (*)    Eosinophils Absolute 0.6 (*)    Abs Immature Granulocytes 0.08 (*)    All other components within normal limits  URINALYSIS, ROUTINE W REFLEX MICROSCOPIC - Abnormal; Notable for the following components:   Protein, ur 30 (*)    All other components within normal limits  LIPASE, BLOOD  CBG MONITORING, ED    Notable for low glucose on labs which not seen on POCT glucose. Mild leukocytosis   Imaging Studies ordered: I ordered imaging studies including RUQ [place] and CT scan On my interpretation imaging demonstrates biliary colic. Vague luminal narrowing of stomach but patient is tolerating PO I independently visualized and interpreted imaging. I agree with the radiologist interpretation   Medicines ordered and prescription drug management: Meds ordered this encounter  Medications   morphine (PF) 4 MG/ML injection 4 mg   ondansetron (ZOFRAN) injection 4 mg   sodium chloride 0.9 % bolus 1,000 mL   ondansetron (ZOFRAN) injection 4 mg   DISCONTD: oxyCODONE (ROXICODONE) 5 MG immediate release tablet    Sig: Take 1 tablet (5 mg total) by mouth every 4 (four) hours as needed for severe  pain.    Dispense:  10 tablet    Refill:  0   DISCONTD: ondansetron (ZOFRAN) 4 MG tablet    Sig: Take 1 tablet (4 mg total) by mouth every 8 (eight) hours as needed for nausea or vomiting.    Dispense:  20 tablet    Refill:  0   oxyCODONE (ROXICODONE) 5 MG immediate release tablet    Sig: Take 1 tablet (5 mg total) by mouth every 4 (four) hours as needed for severe pain.    Dispense:  10 tablet    Refill:  0   ondansetron (ZOFRAN) 4 MG tablet    Sig: Take 1 tablet (4 mg total) by mouth every 8 (eight) hours as needed for nausea or vomiting.    Dispense:  20 tablet    Refill:  0    -I have reviewed the patients home medicines and have made adjustments as needed   Consultations Obtained: I requested consultation with the general surgeon,  and discussed lab and imaging findings as well as pertinent plan - they recommend: outpatient follow up    Cardiac Monitoring: The patient was maintained on a cardiac monitor.  I personally viewed and interpreted the cardiac monitored which showed an underlying rhythm of: nSR  Social Determinants of Health:  Diagnosis or treatment significantly limited by social determinants of health: obesity   Reevaluation: After the interventions noted above, I reevaluated the patient and found that their symptoms have improved  Co morbidities that complicate the patient evaluation  Past Medical History:  Diagnosis Date  Allergy    Anxiety    Asthma    uses inhalers    Bilateral carotid bruits    Cardiac conduction disorder 2018   s/p MDT PPM   Cataract    Chest pain 06/24/2018   Chronic kidney disease    bladder interstial cystitis    Chronic kidney disease (CKD), stage IV (severe) ([name])    followed by Dr. Marisue Humble at [facility]   COPD (chronic obstructive pulmonary disease) ([name])    Coronary artery disease    COVID-19 virus infection 10/21/2020   Depression    Diabetes mellitus without complication ([name])    Discord with neighbors,  lodgers and landlord 11/26/2021   Encounter for care of pacemaker 02/13/2019   GERD (gastroesophageal reflux disease)    Hematemesis 09/16/2016   History of stomach ulcers 2001   Hypertension    LBBB (left bundle branch block)    Lower extremity edema    Mild intermittent asthma without complication    Mixed hyperlipidemia    Mobitz type 2 second degree AV block 04/06/2019   Neuropathy    Pacemaker: Medtronic Azure XT DR MRI [id number] PPM -  BUNDLE OF HIS pacing  02/26/2017   Scheduled Remote pacemaker check  11/12/2018:  There were 24 Fast AV episodes:  EGMs show SVTs. Episodes lasted < 2 minutes. Health trends do not demonstrate significant abnormality. Battery longevity is 9.4 - 10.3 years. RA pacing is 47.1 %, RV pacing is 40.4 %.  Clinic check 11/07/17.    Paroxysmal atrial flutter ([name])    PONV (postoperative nausea and vomiting)    Prostatitis    Recurrent upper respiratory infection (URI)    Schizophrenia ([name])    Sinus node dysfunction ([name]) 02/13/2019   Urticaria       Dispostion: Disposition decision including need for hospitalization was considered, and patient discharged from emergency department.    Final Clinical Impression(s) / ED Diagnoses Final diagnoses:  Biliary colic     This chart was dictated using voice recognition software.  Despite best efforts to proofread,  errors can occur which can change the documentation meaning.    Lonell Grandchild, MD 11/16/22 [phone]

## 2022-11-16 NOTE — Discharge Instructions (Addendum)
We evaluated you for your abdominal pain.  Your symptoms are most likely due to your gallstones.  We did not see any signs of an infection on your testing today.  Your lab test did show some dehydration so be sure to drink a lot of fluids.  We did give you fluids in the emergency department.  We discussed your symptoms with the on-call surgeon today recommends that you continue to follow-up with your outpatient visit tomorrow.  Please take at 1000 mg of Tylenol every 6 hours as needed for pain.  For pain unrelieved by Tylenol you can take oxycodone.  Do not mix oxycodone with alcohol or drive while taking this medicine.  I have also prescribed you medicine called Zofran which you can take as needed for nausea.  You can take this medicine every 8 hours as needed for nausea and vomiting.  If you have any worsening symptoms or develop new symptoms such as fevers or chills, severe worsening pain, unable to eat due to pain or vomiting, uncontrolled vomiting, vomiting blood or blood in your stool, please return to the emergency department for reassessment.

## 2022-11-17 ENCOUNTER — Encounter: Payer: Self-pay | Admitting: Internal Medicine

## 2022-11-17 ENCOUNTER — Telehealth: Payer: Self-pay | Admitting: *Deleted

## 2022-11-17 NOTE — Telephone Encounter (Signed)
   Pre-operative Risk Assessment    Patient Name: Peter Marsh  DOB: 12-Apr-1961 MRN: [id number]      Request for Surgical Clearance    Procedure:   Lap Chole Surgery  Date of Surgery:  Clearance TBD                                 Surgeon:  Dr. [name] Surgeon's Group or Practice Name:  [facility] Phone number:  [phone] Fax number:  [phone]   Type of Clearance Requested:   - Medical  - Pharmacy:  Hold Aspirin and Clopidogrel (Plavix) Not Indicated   Type of Anesthesia:  General    Additional requests/questions:  Pt last appointment was 11/15/2022 with Reather Littler, NP and this was sent to device clinic for a Medtronic Device.   Signed, Emmit Pomfret   11/17/2022, 2:22 PM

## 2022-11-17 NOTE — Progress Notes (Signed)
PERIOPERATIVE PRESCRIPTION FOR IMPLANTED CARDIAC DEVICE PROGRAMMING   Patient Information:  Patient: Peter Marsh  MRN: [id number]  Date of Birth: 03-22-1961      Planned Procedure:  Lap Chole Surgery   Surgeon:  Dr. [name]  Date of Procedure:  TBD    Device Information:   Clinic EP Physician:   Dr. Lewayne Bunting Device Type:  Pacemaker Manufacturer and Phone #:  Medtronic: [phone] Pacemaker Dependent?:  Yes Date of Last Device Check:  09/25/2022        Normal Device Function?:  Yes     Electrophysiologist's Recommendations:   Have magnet available. Provide continuous ECG monitoring when magnet is used or reprogramming is to be performed.  Procedure may interfere with device function.  Magnet should be placed over device during procedure.  Per Device Clinic Standing Orders, Lenor Coffin  11/17/2022 3:48 PM

## 2022-11-18 ENCOUNTER — Other Ambulatory Visit: Payer: Self-pay

## 2022-11-18 DIAGNOSIS — I7143 Infrarenal abdominal aortic aneurysm, without rupture: Secondary | ICD-10-CM

## 2022-11-20 NOTE — Telephone Encounter (Signed)
     Primary Cardiologist: Reatha Harps, MD  Chart reviewed as part of pre-operative protocol coverage. Given past medical history and time since last visit, based on ACC/AHA guidelines, Peter Marsh would be at acceptable risk for the planned procedure without further cardiovascular testing.   His RCRI is class IV risk, percent risk of major cardiac event.  His aspirin Plavix may be held for 5-7 days prior to his procedure.  Please resume as soon as hemostasis is achieved.  He may begin to hold his dual antiplatelet therapy after 11/24/2022.  I will route this recommendation to the requesting party via Epic fax function and remove from pre-op pool.  Please call with questions.  Thomasene Ripple. Jackelin Correia NP-C     11/20/2022, 7:11 AM [facility] [address] Office [phone] Fax [phone]

## 2022-11-22 ENCOUNTER — Telehealth: Payer: Self-pay | Admitting: Cardiovascular Disease

## 2022-11-22 NOTE — Telephone Encounter (Signed)
Wife is calling to confirm how many days patient is to hold plavix for procedure.

## 2022-11-22 NOTE — Telephone Encounter (Signed)
Spoke with pt wife, aware he will stop plavix and asa on 11/24/22 and will hold for 5-7 days. They will ask the person doing the procedure when they can restart.

## 2022-11-24 NOTE — Patient Instructions (Signed)
SURGICAL WAITING ROOM VISITATION  Patients having surgery or a procedure may have no more than 2 support people in the waiting area - these visitors may rotate.    Children under the age of 54 must have an adult with them who is not the patient.  Due to an increase in RSV and influenza rates and associated hospitalizations, children ages 52 and under may not visit patients in [facility] hospitals.  If the patient needs to stay at the hospital during part of their recovery, the visitor guidelines for inpatient rooms apply. Pre-op nurse will coordinate an appropriate time for 1 support person to accompany patient in pre-op.  This support person may not rotate.    Please refer to the [facility] website for the visitor guidelines for Inpatients (after your surgery is over and you are in a regular room).       Your procedure is scheduled on: 11-30-22    Report to [facility] Main Entrance    Report to admitting at      1045  AM   Call this number if you have problems the morning of surgery [phone]   Do not eat food :After Midnight.   After Midnight you may have the following liquids until _10:00_____ AM DAY OF SURGERY  Water Non-Citrus Juices (without pulp, NO RED-Apple, White grape, White cranberry) Black Coffee (NO MILK/CREAM OR CREAMERS, sugar ok)  Clear Tea (NO MILK/CREAM OR CREAMERS, sugar ok) regular and decaf                             Plain Jell-O (NO RED)                                           Fruit ices (not with fruit pulp, NO RED)                                     Popsicles (NO RED)                                                               Sports drinks like Gatorade (NO RED)                      The day of surgery:  Drink ONE (1) Pre-Surgery Clear Ensure or G2 at AM the morning of surgery. Drink in one sitting. Do not sip.  This drink was given to you during your hospital  pre-op appointment visit. Nothing else to drink after completing the   Pre-Surgery Clear Ensure or G2.          If you have questions, please contact your surgeon's office.   FOLLOW ANY ADDITIONAL PRE OP INSTRUCTIONS YOU RECEIVED FROM YOUR SURGEON'S OFFICE!!!     Oral Hygiene is also important to reduce your risk of infection.                                    Remember -  BRUSH YOUR TEETH THE MORNING OF SURGERY WITH YOUR REGULAR TOOTHPASTE  DENTURES WILL BE REMOVED PRIOR TO SURGERY PLEASE DO NOT APPLY "Poly grip" OR ADHESIVES!!!   Do NOT smoke after Midnight   Take these medicines the morning of surgery with A SIP OF WATER: pantoprazole, oxycodone if needed, Isordil, hydralazine, Clonodine, Zetia, Wellbutrin, inhalers and bring rescue inhaler with you, amlodipine  DO NOT TAKE ANY ORAL DIABETIC MEDICATIONS DAY OF YOUR SURGERY  Bring CPAP mask and tubing day of surgery.                              You may not have any metal on your body including hair pins, jewelry, and body piercing             Do not wear lotions, powders, perfumes/cologne, or deodorant               Men may shave face and neck.   Do not bring valuables to the hospital. [facility] IS NOT             RESPONSIBLE   FOR VALUABLES.   Contacts, glasses, dentures or bridgework may not be worn into surgery.   Bring small overnight bag day of surgery.   DO NOT BRING YOUR HOME MEDICATIONS TO THE HOSPITAL. PHARMACY WILL DISPENSE MEDICATIONS LISTED ON YOUR MEDICATION LIST TO YOU DURING YOUR ADMISSION IN THE HOSPITAL!    Patients discharged on the day of surgery will not be allowed to drive home.  Someone NEEDS to stay with you for the first 24 hours after anesthesia.   Special Instructions: Bring a copy of your healthcare power of attorney and living will documents the day of surgery if you haven't scanned them before.              Please read over the following fact sheets you were given: IF YOU HAVE QUESTIONS ABOUT YOUR PRE-OP INSTRUCTIONS PLEASE CALL [phone]    If you test  positive for Covid or have been in contact with anyone that has tested positive in the last 10 days please notify you surgeon.    [facility] - Preparing for Surgery Before surgery, you can play an important role.  Because skin is not sterile, your skin needs to be as free of germs as possible.  You can reduce the number of germs on your skin by washing with CHG (chlorahexidine gluconate) soap before surgery.  CHG is an antiseptic cleaner which kills germs and bonds with the skin to continue killing germs even after washing. Please DO NOT use if you have an allergy to CHG or antibacterial soaps.  If your skin becomes reddened/irritated stop using the CHG and inform your nurse when you arrive at Short Stay. Do not shave (including legs and underarms) for at least 48 hours prior to the first CHG shower.  You may shave your face/neck. Please follow these instructions carefully:  1.  Shower with CHG Soap the night before surgery and the  morning of Surgery.  2.  If you choose to wash your hair, wash your hair first as usual with your  normal  shampoo.  3.  After you shampoo, rinse your hair and body thoroughly to remove the  shampoo.                           4.  Use CHG as you would  any other liquid soap.  You can apply chg directly  to the skin and wash                       Gently with a scrungie or clean washcloth.  5.  Apply the CHG Soap to your body ONLY FROM THE NECK DOWN.   Do not use on face/ open                           Wound or open sores. Avoid contact with eyes, ears mouth and genitals (private parts).                       Wash face,  Genitals (private parts) with your normal soap.             6.  Wash thoroughly, paying special attention to the area where your surgery  will be performed.  7.  Thoroughly rinse your body with warm water from the neck down.  8.  DO NOT shower/wash with your normal soap after using and rinsing off  the CHG Soap.                9.  Pat yourself dry with a  clean towel.            10.  Wear clean pajamas.            11.  Place clean sheets on your bed the night of your first shower and do not  sleep with pets. Day of Surgery : Do not apply any lotions/deodorants the morning of surgery.  Please wear clean clothes to the hospital/surgery center.  FAILURE TO FOLLOW THESE INSTRUCTIONS MAY RESULT IN THE CANCELLATION OF YOUR SURGERY PATIENT SIGNATURE_________________________________  NURSE SIGNATURE__________________________________  ________________________________________________________________________

## 2022-11-24 NOTE — Progress Notes (Addendum)
Please place orders in epic for preop 

## 2022-11-24 NOTE — Progress Notes (Addendum)
PCP - Tracey Harries, MD LOV 10-09-22 Cardiologist - Lennie Odor, MD Clearance Edd Fabian PA-C 11-20-22 epic  PPM/ICD - Medtronic Device Orders - on chart  Rep Notified - no Last device check 09-25-22  Chest x-ray -  EKG - 11-15-22 Stress Test -  ECHO - 10-02-22 Cardiac Cath - 05-26-22 CBC/DIFF, CMP 11-16-22 epic  Sleep Study -  CPAP -   Fasting Blood Sugar -  Checks Blood Sugar _____ times a day  Blood Thinner Instructions:Plavix last dose 11-24-22 Aspirin Instructions:Last dose 11-24-22   ERAS Protcol - PRE-SURGERY N/A  NO orders in epic at preop  COVID vaccine -yes  Activity--Able to climb a flight of stairs and complete ADL's without CP or SOB Anesthesia review: CAD, HTN, Pacemaker, 2nd degree Heart block, DM, CKD ,schizophrenia, COPD  Patient denies shortness of breath, fever, cough and chest pain at PAT appointment   All instructions explained to the patient, with a verbal understanding of the material. Patient agrees to go over the instructions while at home for a better understanding. Patient also instructed to self quarantine after being tested for COVID-19. The opportunity to ask questions was provided.

## 2022-11-27 ENCOUNTER — Other Ambulatory Visit: Payer: Self-pay

## 2022-11-27 ENCOUNTER — Encounter (HOSPITAL_COMMUNITY): Payer: Self-pay

## 2022-11-27 ENCOUNTER — Encounter (HOSPITAL_COMMUNITY)
Admission: RE | Admit: 2022-11-27 | Discharge: 2022-11-27 | Disposition: A | Discharge status: Home / Self Care | Payer: 59 | Source: Ambulatory Visit | Attending: General Surgery | Admitting: General Surgery

## 2022-11-27 VITALS — BP 113/79 | HR 80 | Temp 98.0°F | Ht 73.0 in | Wt 187.0 lb

## 2022-11-27 DIAGNOSIS — Z01812 Encounter for preprocedural laboratory examination: Secondary | ICD-10-CM | POA: Diagnosis not present

## 2022-11-27 DIAGNOSIS — I447 Left bundle-branch block, unspecified: Secondary | ICD-10-CM | POA: Insufficient documentation

## 2022-11-27 DIAGNOSIS — I2510 Atherosclerotic heart disease of native coronary artery without angina pectoris: Secondary | ICD-10-CM | POA: Insufficient documentation

## 2022-11-27 DIAGNOSIS — J4489 Other specified chronic obstructive pulmonary disease: Secondary | ICD-10-CM | POA: Insufficient documentation

## 2022-11-27 DIAGNOSIS — E1122 Type 2 diabetes mellitus with diabetic chronic kidney disease: Secondary | ICD-10-CM | POA: Insufficient documentation

## 2022-11-27 DIAGNOSIS — I252 Old myocardial infarction: Secondary | ICD-10-CM | POA: Diagnosis not present

## 2022-11-27 DIAGNOSIS — Z8616 Personal history of COVID-19: Secondary | ICD-10-CM | POA: Insufficient documentation

## 2022-11-27 DIAGNOSIS — K219 Gastro-esophageal reflux disease without esophagitis: Secondary | ICD-10-CM | POA: Insufficient documentation

## 2022-11-27 DIAGNOSIS — N184 Chronic kidney disease, stage 4 (severe): Secondary | ICD-10-CM | POA: Insufficient documentation

## 2022-11-27 DIAGNOSIS — I1310 Hypertensive heart and chronic kidney disease without heart failure, with stage 1 through stage 4 chronic kidney disease, or unspecified chronic kidney disease: Secondary | ICD-10-CM | POA: Insufficient documentation

## 2022-11-27 DIAGNOSIS — E119 Type 2 diabetes mellitus without complications: Secondary | ICD-10-CM

## 2022-11-27 HISTORY — DX: Personal history of urinary calculi: Z87.442

## 2022-11-27 HISTORY — DX: Anemia, unspecified: D64.9

## 2022-11-27 HISTORY — DX: Acute myocardial infarction, unspecified: I21.9

## 2022-11-27 LAB — HEMOGLOBIN A1C
Hgb A1c MFr Bld: 5.6 % (ref 4.8–5.6)
Mean Plasma Glucose: 114.02 mg/dL

## 2022-11-27 LAB — GLUCOSE, CAPILLARY: Glucose-Capillary: 101 mg/dL — ABNORMAL HIGH (ref 70–99)

## 2022-11-28 ENCOUNTER — Telehealth: Payer: Self-pay | Admitting: Cardiovascular Disease

## 2022-11-28 DIAGNOSIS — I10 Essential (primary) hypertension: Secondary | ICD-10-CM

## 2022-11-28 MED ORDER — HYDRALAZINE HCL 100 MG PO TABS: 100.0000 mg | ORAL_TABLET | Freq: Three times a day (TID) | ORAL | 3 refills | Status: AC

## 2022-11-28 MED ORDER — AMLODIPINE BESYLATE 5 MG PO TABS: 5.0000 mg | ORAL_TABLET | Freq: Every day | ORAL | 3 refills | Status: AC

## 2022-11-28 NOTE — Telephone Encounter (Signed)
*  STAT* If patient is at the pharmacy, call can be transferred to refill team.   1. Which medications need to be refilled? (please list name of each medication and dose if known) hydrALAZINE (APRESOLINE) 100 MG tablet amLODipine (NORVASC) 5 MG tablet   2. Which pharmacy/location (including street and city if local pharmacy) is medication to be sent to? [facility] [phone] - [place], [place] - [address]  3. Do they need a 30 day or 90 day supply?  90 day supply

## 2022-11-28 NOTE — Anesthesia Preprocedure Evaluation (Signed)
Anesthesia Evaluation  Patient identified by MRN, date of birth, ID band Patient awake    Reviewed: Allergy & Precautions, NPO status , Patient's Chart, lab work & pertinent test results, reviewed documented beta blocker date and time   History of Anesthesia Complications (+) PONV and history of anesthetic complications  Airway Mallampati: III  TM Distance: >3 FB Neck ROM: Full    Dental  (+) Edentulous Upper, Edentulous Lower   Pulmonary asthma , COPD,  COPD inhaler, former smoker   Pulmonary exam normal        Cardiovascular hypertension, Pt. on medications and Pt. on home beta blockers + CAD and + Past MI  Normal cardiovascular exam+ dysrhythmias Atrial Fibrillation + pacemaker    '24 TTE - EF 55 to 60%. There is mild asymmetric left ventricular hypertrophy of the basal-septal segment. Grade I diastolic dysfunction (impaired relaxation).  Mild mitral valve regurgitation.     Neuro/Psych  PSYCHIATRIC DISORDERS Anxiety Depression Bipolar Disorder    Schizoaffective d/o negative neurological ROS     GI/Hepatic PUD,GERD  Medicated and Controlled,,(+) Hepatitis -, C  Endo/Other  diabetes, Type 2    Renal/GU CRFRenal disease     Musculoskeletal negative musculoskeletal ROS (+)    Abdominal   Peds  Hematology  On plavix    Anesthesia Other Findings   Reproductive/Obstetrics                             Anesthesia Physical Anesthesia Plan  ASA: 3  Anesthesia Plan: General   Post-op Pain Management: Tylenol PO (pre-op)* and Precedex   Induction: Intravenous  PONV Risk Score and Plan: 3 and Treatment may vary due to age or medical condition, Ondansetron, Midazolam and Propofol infusion  Airway Management Planned: Oral ETT  Additional Equipment: None  Intra-op Plan:   Post-operative Plan: Extubation in OR  Informed Consent: I have reviewed the patients History and Physical,  chart, labs and discussed the procedure including the risks, benefits and alternatives for the proposed anesthesia with the patient or authorized representative who has indicated his/her understanding and acceptance.     Dental advisory given  Plan Discussed with: CRNA and Anesthesiologist  Anesthesia Plan Comments: (See PAT note. Magnet for PPM per EP recommendations )        Anesthesia Quick Evaluation

## 2022-11-28 NOTE — Progress Notes (Signed)
Choose an anesthesia record to view details        DISCUSSION: Peter Marsh is a 62 yo male who presents to PAT prior to lap chole. PMH significant for former smoking (quit 2010), recent MI (Jan 2024), CAD s/p PCI to LAD, HTN, LBBB, Mobitz type 2 heart block s/p PPM (2018), paroxysmal A fib/flutter(not anticoagulated), AAA, asthma/COPD, CKD stage IV, DM, GERD, PUD, schizophrenia.  Prior anesthesia complications include PONV  Patient was diagnosed with acute cholecystitis in May 2024. Was hospitalized and treated non-operatively with IV abx since he was unable to stop DAPT due to recent MI with PCI.   Patient followed up with Cardiology on 09/05/22. Noted to be doing well from a cardiac standpoint however reported multiple personal stressors. Cardiac risk assessment/clearance done on 11/20/22:  "Chart reviewed as part of pre-operative protocol coverage. Given past medical history and time since last visit, based on ACC/AHA guidelines, Peter Marsh would be at acceptable risk for the planned procedure without further cardiovascular testing.  His RCRI is class IV risk, percent risk of major cardiac event.  His aspirin Plavix may be held for 5-7 days prior to his procedure.  Please resume as soon as hemostasis is achieved.  He may begin to hold his dual antiplatelet therapy after 11/24/2022."  Pacemaker device orders are in progress note dated 11/17/22.  Patient has known AAA and penetrating aortic ulcer. CTA chest on 10/26/22 showed penetrating ulcer was stable and repeat imaging ordered for 1 year. CTA abdomen on 10/01/22 showed 3.9 x 3.7 cm aortic aneurysm.  Follows with vascular surgery.  Repeat abdominal ultrasound plan for 8/24. Advised to maintain good blood pressure control.   VS: BP 113/79   Pulse 80   Temp 36.7 C (Oral)   Ht 6\' 1"  (1.854 m)   Wt 84.8 kg   SpO2 98%   BMI 24.67 kg/m   PROVIDERS: Peter Harries, MD   LABS: Labs reviewed: Acceptable for surgery. Pt with known CKD at  baseline. (all labs ordered are listed, but only abnormal results are displayed)  Labs Reviewed  GLUCOSE, CAPILLARY - Abnormal; Notable for the following components:      Result Value   Glucose-Capillary 101 (*)    All other components within normal limits  HEMOGLOBIN A1C     IMAGES:  CT Abdomen wo contrast 11/16/22: IMPRESSION: 1. Interposition of the distal stomach between the transverse colon and anterior abdominal wall. There is narrowing of the distal stomach as it passes over the transverse colon. A degree of luminal narrowing due to compression of the distal stomach by the colon is not excluded. 2. Cholelithiasis. 3. Nonobstructing bilateral renal calculi. Similar appearance of large right renal parapelvic cyst versus less likely hydronephrosis. 4. A 4 cm fusiform infrarenal abdominal aortic aneurysm. Recommend follow-up every 12 months and vascular consultation. This recommendation follows ACR consensus guidelines: White Paper of the ACR Incidental Findings Committee II on Vascular Findings. J Am Coll Radiol 2013; 65:784-696. 5.  Aortic Atherosclerosis (ICD10-I70.0).   EKG: 11/15/22  Atrial pace rhythm with prolonged AV conduction, rate 73 LBBB   CV:  Echo 10/02/22:  IMPRESSIONS     1. Left ventricular ejection fraction, by estimation, is 55 to 60%. Left  ventricular ejection fraction by 3D volume is 57 %. The left ventricle has  normal function. The left ventricle has no regional wall motion  abnormalities. There is mild asymmetric  left ventricular hypertrophy of the basal-septal segment. Left ventricular  diastolic parameters are consistent with  Grade I diastolic dysfunction  (impaired relaxation).   2. Right ventricular systolic function is normal. The right ventricular  size is normal.   3. The mitral valve is abnormal. Mild mitral valve regurgitation.   4. The aortic valve is tricuspid. Aortic valve regurgitation is not  visualized.    Comparison(s): Changes from prior study are noted. 05/26/2022: LVEF 40-45%,  pardoxical septal motion, apical hypokinesis.   Left heart cath 05/26/2022:    Prox RCA lesion is 25% stenosed.   Prox LAD lesion is 99% stenosed.  A drug-eluting stent was successfully placed using a SYNERGY XD [id number] postdilated to greater than 4 m in diameter and optimized with intravascular ultrasound..   Post intervention, there is a 0% residual stenosis.   LV end diastolic pressure is normal.   There is no aortic valve stenosis.   Successful PCI of the severe lesion in the proximal LAD.  Continue dual antiplatelet therapy for at least 12 months.  Continue aggressive secondary prevention.  Will decrease his dose of isosorbide from 4 times a day down to 3 times a day.  This can gradually be weaned off based on his symptoms.  Watch overnight.  Hydrate aggressively due to renal insufficiency.  Specific emphasis was placed on minimizing contrast.  Only 50 cc of contrast was used for the procedure.  Plan for discharge tomorrow.  Past Medical History:  Diagnosis Date   Allergy    Anemia    as a child   Anxiety    Asthma    uses inhalers    Bilateral carotid bruits    Cardiac conduction disorder 2018   s/p MDT PPM   Cataract    Chest pain 06/24/2018   Chronic kidney disease    bladder interstial cystitis    Chronic kidney disease (CKD), stage IV (severe) ([name])    followed by Dr. Marisue Marsh at [facility]   COPD (chronic obstructive pulmonary disease) ([name])    Coronary artery disease    COVID-19 virus infection 10/21/2020   Depression    Diabetes mellitus without complication ([name])    Discord with neighbors, lodgers and landlord 11/26/2021   Encounter for care of pacemaker 02/13/2019   GERD (gastroesophageal reflux disease)    Hematemesis 09/16/2016   History of kidney stones    History of stomach ulcers 2001   Hypertension    LBBB (left bundle branch block)    Lower extremity edema    Mild  intermittent asthma without complication    Mixed hyperlipidemia    Mobitz type 2 second degree AV block 04/06/2019   Myocardial infarction ([name])    05-25-2022  stent x1   Neuropathy    Pacemaker: Medtronic Azure XT DR MRI [id number] PPM -  BUNDLE OF HIS pacing  02/26/2017   Scheduled Remote pacemaker check  11/12/2018:  There were 24 Fast AV episodes:  EGMs show SVTs. Episodes lasted < 2 minutes. Health trends do not demonstrate significant abnormality. Battery longevity is 9.4 - 10.3 years. RA pacing is 47.1 %, RV pacing is 40.4 %.  Clinic check 11/07/17.    Paroxysmal atrial flutter ([name])    PONV (postoperative nausea and vomiting)    Prostatitis    Recurrent upper respiratory infection (URI)    Schizophrenia ([name])    Sinus node dysfunction ([name]) 02/13/2019   Urticaria     Past Surgical History:  Procedure Laterality Date   ADENOIDECTOMY     APPENDECTOMY     COLONOSCOPY     CORONARY STENT  INTERVENTION N/A 05/26/2022   Procedure: CORONARY STENT INTERVENTION;  Surgeon: Corky Crafts, MD;  Location[facility] INVASIVE CV LAB;  Service: Cardiovascular;  Laterality: N/A;   CORONARY ULTRASOUND/IVUS N/A 05/26/2022   Procedure: Intravascular Ultrasound/IVUS;  Surgeon: Corky Crafts, MD;  Location: [facility] INVASIVE CV LAB;  Service: Cardiovascular;  Laterality: N/A;   ELECTROPHYSIOLOGY STUDY N/A 02/26/2017   Procedure: ELECTROPHYSIOLOGY STUDY;  Surgeon: Marinus Maw, MD;  Location: [facility] INVASIVE CV LAB;  Service: Cardiovascular;  Laterality: N/A;   ESOPHAGOGASTRODUODENOSCOPY (EGD) WITH PROPOFOL N/A 09/18/2016   Procedure: ESOPHAGOGASTRODUODENOSCOPY (EGD) WITH PROPOFOL;  Surgeon: Kerin Salen, MD;  Location: [facility] ENDOSCOPY;  Service: Gastroenterology;  Laterality: N/A;   LEFT HEART CATH AND CORONARY ANGIOGRAPHY N/A 05/26/2022   Procedure: LEFT HEART CATH AND CORONARY ANGIOGRAPHY;  Surgeon: Corky Crafts, MD;  Location: [facility] INVASIVE CV LAB;  Service: Cardiovascular;  Laterality: N/A;   LUMBAR  LAMINECTOMY/DECOMPRESSION MICRODISCECTOMY  04/19/2011   Procedure: LUMBAR LAMINECTOMY/DECOMPRESSION MICRODISCECTOMY;  Surgeon: Jacki Cones;  Location: [facility] ORS;  Service: Orthopedics;  Laterality: Left;  Hemi LAminectomy/Microdiscectomy Lumbar four  - Lumbar five  on the Left (X-Ray)   PACEMAKER IMPLANT N/A 02/26/2017   Procedure: PACEMAKER IMPLANT;  Surgeon: Marinus Maw, MD;  Location: [facility] INVASIVE CV LAB;  Service: Cardiovascular;  Laterality: N/A;   TONSILLECTOMY     UPPER GASTROINTESTINAL ENDOSCOPY      MEDICATIONS:  albuterol (VENTOLIN HFA) 108 (90 Base) MCG/ACT inhaler   amLODipine (NORVASC) 5 MG tablet   aspirin 81 MG chewable tablet   Budeson-Glycopyrrol-Formoterol (BREZTRI AEROSPHERE) 160-9-4.8 MCG/ACT AERO   buPROPion (WELLBUTRIN SR) 150 MG 12 hr tablet   cloNIDine (CATAPRES) 0.2 MG tablet   clopidogrel (PLAVIX) 75 MG tablet   ezetimibe (ZETIA) 10 MG tablet   hydrALAZINE (APRESOLINE) 100 MG tablet   isosorbide dinitrate (ISORDIL) 30 MG tablet   metoprolol succinate (TOPROL-XL) 100 MG 24 hr tablet   montelukast (SINGULAIR) 10 MG tablet   nitroGLYCERIN (NITROSTAT) 0.4 MG SL tablet   ondansetron (ZOFRAN) 4 MG tablet   oxyCODONE (ROXICODONE) 5 MG immediate release tablet   paliperidone (INVEGA) 6 MG 24 hr tablet   pantoprazole (PROTONIX) 40 MG tablet   Spacer/Aero-Holding Chambers DEVI   sucralfate (CARAFATE) 1 GM/10ML suspension   Vitamin D, Ergocalciferol, (DRISDOL) 1.25 MG (50000 UNIT) CAPS capsule   No current facility-administered medications for this encounter.    acetaminophen (OFIRMEV) IVPB   glycopyrrolate (ROBINUL) injection   Marcille Blanco [facility] Surgical Short Stay/Anesthesiology [facility] Phone [phone] 11/28/2022 10:52 AM

## 2022-11-28 NOTE — Telephone Encounter (Signed)
Pt's medications were sent to pt's pharmacy as requested. Confirmation received.  

## 2022-11-29 ENCOUNTER — Telehealth: Payer: Self-pay

## 2022-11-29 NOTE — Telephone Encounter (Signed)
Patient walked in clinic upset about his medications. I walked out to help him and patient was yelling and very aggressive. I did ask him to refrain from and to not jump in my face and put his hands in my face.   Amlodipine and hydralazine was called in yesterday but was sent to the wrong pharmacy. [facility] has sent amlodipine and hydralazine to [facility]. I checked all cardiac medications and he has refills on all his medications. He still left office upset.

## 2022-11-30 ENCOUNTER — Other Ambulatory Visit: Payer: Self-pay

## 2022-11-30 ENCOUNTER — Encounter (HOSPITAL_COMMUNITY)
Admission: RE | Disposition: A | Discharge status: Home / Self Care | Payer: Self-pay | Source: Home / Self Care | Attending: General Surgery

## 2022-11-30 ENCOUNTER — Encounter (HOSPITAL_COMMUNITY): Payer: Self-pay | Admitting: General Surgery

## 2022-11-30 ENCOUNTER — Ambulatory Visit (HOSPITAL_COMMUNITY): Payer: Self-pay | Admitting: Medical

## 2022-11-30 ENCOUNTER — Telehealth: Payer: Self-pay

## 2022-11-30 ENCOUNTER — Ambulatory Visit (HOSPITAL_BASED_OUTPATIENT_CLINIC_OR_DEPARTMENT_OTHER): Payer: 59 | Admitting: Anesthesiology

## 2022-11-30 ENCOUNTER — Ambulatory Visit (HOSPITAL_COMMUNITY)
Admission: RE | Admit: 2022-11-30 | Discharge: 2022-11-30 | Disposition: A | Discharge status: Home / Self Care | Payer: 59 | Attending: General Surgery | Admitting: General Surgery

## 2022-11-30 ENCOUNTER — Other Ambulatory Visit (HOSPITAL_COMMUNITY): Payer: Self-pay

## 2022-11-30 DIAGNOSIS — Z950 Presence of cardiac pacemaker: Secondary | ICD-10-CM | POA: Insufficient documentation

## 2022-11-30 DIAGNOSIS — F250 Schizoaffective disorder, bipolar type: Secondary | ICD-10-CM | POA: Diagnosis not present

## 2022-11-30 DIAGNOSIS — K811 Chronic cholecystitis: Secondary | ICD-10-CM | POA: Insufficient documentation

## 2022-11-30 DIAGNOSIS — Z87891 Personal history of nicotine dependence: Secondary | ICD-10-CM

## 2022-11-30 DIAGNOSIS — N1832 Chronic kidney disease, stage 3b: Secondary | ICD-10-CM

## 2022-11-30 DIAGNOSIS — I447 Left bundle-branch block, unspecified: Secondary | ICD-10-CM | POA: Diagnosis not present

## 2022-11-30 DIAGNOSIS — K219 Gastro-esophageal reflux disease without esophagitis: Secondary | ICD-10-CM | POA: Insufficient documentation

## 2022-11-30 DIAGNOSIS — E1122 Type 2 diabetes mellitus with diabetic chronic kidney disease: Secondary | ICD-10-CM | POA: Diagnosis not present

## 2022-11-30 DIAGNOSIS — I700 Atherosclerosis of aorta: Secondary | ICD-10-CM | POA: Diagnosis not present

## 2022-11-30 DIAGNOSIS — Z955 Presence of coronary angioplasty implant and graft: Secondary | ICD-10-CM | POA: Insufficient documentation

## 2022-11-30 DIAGNOSIS — I4892 Unspecified atrial flutter: Secondary | ICD-10-CM | POA: Diagnosis not present

## 2022-11-30 DIAGNOSIS — I130 Hypertensive heart and chronic kidney disease with heart failure and stage 1 through stage 4 chronic kidney disease, or unspecified chronic kidney disease: Secondary | ICD-10-CM | POA: Insufficient documentation

## 2022-11-30 DIAGNOSIS — J449 Chronic obstructive pulmonary disease, unspecified: Secondary | ICD-10-CM | POA: Insufficient documentation

## 2022-11-30 DIAGNOSIS — F419 Anxiety disorder, unspecified: Secondary | ICD-10-CM | POA: Diagnosis not present

## 2022-11-30 DIAGNOSIS — I2510 Atherosclerotic heart disease of native coronary artery without angina pectoris: Secondary | ICD-10-CM | POA: Insufficient documentation

## 2022-11-30 DIAGNOSIS — I5032 Chronic diastolic (congestive) heart failure: Secondary | ICD-10-CM

## 2022-11-30 DIAGNOSIS — I340 Nonrheumatic mitral (valve) insufficiency: Secondary | ICD-10-CM | POA: Diagnosis not present

## 2022-11-30 DIAGNOSIS — N184 Chronic kidney disease, stage 4 (severe): Secondary | ICD-10-CM | POA: Insufficient documentation

## 2022-11-30 DIAGNOSIS — I252 Old myocardial infarction: Secondary | ICD-10-CM | POA: Diagnosis not present

## 2022-11-30 DIAGNOSIS — Z79899 Other long term (current) drug therapy: Secondary | ICD-10-CM | POA: Diagnosis not present

## 2022-11-30 DIAGNOSIS — K8000 Calculus of gallbladder with acute cholecystitis without obstruction: Secondary | ICD-10-CM | POA: Diagnosis not present

## 2022-11-30 DIAGNOSIS — I7143 Infrarenal abdominal aortic aneurysm, without rupture: Secondary | ICD-10-CM | POA: Diagnosis not present

## 2022-11-30 DIAGNOSIS — Z7902 Long term (current) use of antithrombotics/antiplatelets: Secondary | ICD-10-CM | POA: Diagnosis not present

## 2022-11-30 DIAGNOSIS — I4891 Unspecified atrial fibrillation: Secondary | ICD-10-CM | POA: Diagnosis not present

## 2022-11-30 DIAGNOSIS — B1920 Unspecified viral hepatitis C without hepatic coma: Secondary | ICD-10-CM | POA: Insufficient documentation

## 2022-11-30 DIAGNOSIS — E782 Mixed hyperlipidemia: Secondary | ICD-10-CM | POA: Diagnosis not present

## 2022-11-30 DIAGNOSIS — I441 Atrioventricular block, second degree: Secondary | ICD-10-CM | POA: Insufficient documentation

## 2022-11-30 DIAGNOSIS — E119 Type 2 diabetes mellitus without complications: Secondary | ICD-10-CM

## 2022-11-30 HISTORY — PX: CHOLECYSTECTOMY: SHX55

## 2022-11-30 LAB — GLUCOSE, CAPILLARY: Glucose-Capillary: 137 mg/dL — ABNORMAL HIGH (ref 70–99)

## 2022-11-30 SURGERY — LAPAROSCOPIC CHOLECYSTECTOMY WITH INTRAOPERATIVE CHOLANGIOGRAM
Anesthesia: General

## 2022-11-30 MED ORDER — LABETALOL HCL 5 MG/ML IV SOLN
5.0000 mg | INTRAVENOUS | Status: DC | PRN
  Administered 2022-11-30: 5 mg via INTRAVENOUS

## 2022-11-30 MED ORDER — ONDANSETRON HCL 4 MG/2ML IJ SOLN
INTRAMUSCULAR | Status: AC
  Filled 2022-11-30: qty 2

## 2022-11-30 MED ORDER — OXYCODONE HCL 5 MG PO TABS: 5.0000 mg | ORAL_TABLET | Freq: Once | ORAL | Status: AC | PRN

## 2022-11-30 MED ORDER — LABETALOL HCL 5 MG/ML IV SOLN
INTRAVENOUS | Status: DC | PRN
  Administered 2022-11-30 (×3): 5 mg via INTRAVENOUS

## 2022-11-30 MED ORDER — FENTANYL CITRATE (PF) 100 MCG/2ML IJ SOLN
INTRAMUSCULAR | Status: AC
  Filled 2022-11-30: qty 2

## 2022-11-30 MED ORDER — SODIUM CHLORIDE 0.9 % IV SOLN
2.0000 g | INTRAVENOUS | Status: AC
  Administered 2022-11-30: 2 g via INTRAVENOUS

## 2022-11-30 MED ORDER — CHLORHEXIDINE GLUCONATE CLOTH 2 % EX PADS: 6.0000 | MEDICATED_PAD | Freq: Once | CUTANEOUS | Status: DC

## 2022-11-30 MED ORDER — OXYCODONE HCL 5 MG PO TABS
ORAL_TABLET | ORAL | Status: AC
  Administered 2022-11-30: 5 mg via ORAL
  Filled 2022-11-30: qty 1

## 2022-11-30 MED ORDER — ACETAMINOPHEN 500 MG PO TABS
1000.0000 mg | ORAL_TABLET | Freq: Once | ORAL | Status: AC
  Administered 2022-11-30: 1000 mg via ORAL
  Filled 2022-11-30: qty 2

## 2022-11-30 MED ORDER — LABETALOL HCL 5 MG/ML IV SOLN
INTRAVENOUS | Status: AC
  Filled 2022-11-30: qty 4

## 2022-11-30 MED ORDER — LACTATED RINGERS IR SOLN
Status: DC | PRN
  Administered 2022-11-30: 1000 mL

## 2022-11-30 MED ORDER — MIDAZOLAM HCL 5 MG/5ML IJ SOLN
INTRAMUSCULAR | Status: DC | PRN
  Administered 2022-11-30: 2 mg via INTRAVENOUS

## 2022-11-30 MED ORDER — CHLORHEXIDINE GLUCONATE 0.12 % MT SOLN
15.0000 mL | Freq: Once | OROMUCOSAL | Status: AC
  Administered 2022-11-30: 15 mL via OROMUCOSAL

## 2022-11-30 MED ORDER — FENTANYL CITRATE PF 50 MCG/ML IJ SOSY
PREFILLED_SYRINGE | INTRAMUSCULAR | Status: AC
  Administered 2022-11-30: 50 ug via INTRAVENOUS
  Filled 2022-11-30: qty 2

## 2022-11-30 MED ORDER — FENTANYL CITRATE PF 50 MCG/ML IJ SOSY
25.0000 ug | PREFILLED_SYRINGE | INTRAMUSCULAR | Status: DC | PRN
  Administered 2022-11-30 (×2): 50 ug via INTRAVENOUS

## 2022-11-30 MED ORDER — SODIUM CHLORIDE 0.9 % IV SOLN
INTRAVENOUS | Status: AC
  Filled 2022-11-30: qty 2

## 2022-11-30 MED ORDER — LIDOCAINE HCL (PF) 2 % IJ SOLN
INTRAMUSCULAR | Status: AC
  Filled 2022-11-30: qty 5

## 2022-11-30 MED ORDER — INSULIN ASPART 100 UNIT/ML IJ SOLN: 0.0000 [IU] | INTRAMUSCULAR | Status: DC | PRN

## 2022-11-30 MED ORDER — 0.9 % SODIUM CHLORIDE (POUR BTL) OPTIME
TOPICAL | Status: DC | PRN
  Administered 2022-11-30: 1000 mL

## 2022-11-30 MED ORDER — MIDAZOLAM HCL 2 MG/2ML IJ SOLN
INTRAMUSCULAR | Status: AC
  Filled 2022-11-30: qty 2

## 2022-11-30 MED ORDER — SUGAMMADEX SODIUM 200 MG/2ML IV SOLN
INTRAVENOUS | Status: DC | PRN
  Administered 2022-11-30: 200 mg via INTRAVENOUS

## 2022-11-30 MED ORDER — BUPIVACAINE-EPINEPHRINE 0.25% -1:200000 IJ SOLN
INTRAMUSCULAR | Status: AC
  Filled 2022-11-30: qty 1

## 2022-11-30 MED ORDER — ROCURONIUM BROMIDE 10 MG/ML (PF) SYRINGE
PREFILLED_SYRINGE | INTRAVENOUS | Status: DC | PRN
  Administered 2022-11-30: 50 mg via INTRAVENOUS

## 2022-11-30 MED ORDER — ONDANSETRON HCL 4 MG/2ML IJ SOLN
4.0000 mg | Freq: Once | INTRAMUSCULAR | Status: AC | PRN
  Administered 2022-11-30: 4 mg via INTRAVENOUS

## 2022-11-30 MED ORDER — PROPOFOL 10 MG/ML IV BOLUS
INTRAVENOUS | Status: DC | PRN
  Administered 2022-11-30: 100 mg via INTRAVENOUS
  Administered 2022-11-30 (×2): 50 mg via INTRAVENOUS

## 2022-11-30 MED ORDER — LACTATED RINGERS IV SOLN: INTRAVENOUS | Status: DC

## 2022-11-30 MED ORDER — LIDOCAINE 2% (20 MG/ML) 5 ML SYRINGE
INTRAMUSCULAR | Status: DC | PRN
  Administered 2022-11-30: 60 mg via INTRAVENOUS

## 2022-11-30 MED ORDER — DEXAMETHASONE SODIUM PHOSPHATE 10 MG/ML IJ SOLN
INTRAMUSCULAR | Status: AC
  Filled 2022-11-30: qty 1

## 2022-11-30 MED ORDER — OXYCODONE HCL 5 MG PO TABS: 5.0000 mg | ORAL_TABLET | Freq: Four times a day (QID) | ORAL | 0 refills | Status: DC | PRN

## 2022-11-30 MED ORDER — SPY AGENT GREEN - (INDOCYANINE FOR INJECTION)
1.2500 mg | Freq: Once | INTRAMUSCULAR | Status: AC
  Administered 2022-11-30: 1.25 mg via INTRAVENOUS
  Filled 2022-11-30: qty 10

## 2022-11-30 MED ORDER — BUPIVACAINE-EPINEPHRINE 0.25% -1:200000 IJ SOLN
INTRAMUSCULAR | Status: DC | PRN
  Administered 2022-11-30: 10 mL

## 2022-11-30 MED ORDER — FENTANYL CITRATE PF 50 MCG/ML IJ SOSY
PREFILLED_SYRINGE | INTRAMUSCULAR | Status: AC
  Filled 2022-11-30: qty 1

## 2022-11-30 MED ORDER — LABETALOL HCL 5 MG/ML IV SOLN
INTRAVENOUS | Status: AC
  Administered 2022-11-30: 5 mg via INTRAVENOUS
  Filled 2022-11-30: qty 4

## 2022-11-30 MED ORDER — ACETAMINOPHEN 500 MG PO TABS: 1000.0000 mg | ORAL_TABLET | Freq: Three times a day (TID) | ORAL | Status: AC

## 2022-11-30 MED ORDER — FENTANYL CITRATE (PF) 100 MCG/2ML IJ SOLN
INTRAMUSCULAR | Status: DC | PRN
  Administered 2022-11-30: 100 ug via INTRAVENOUS
  Administered 2022-11-30 (×2): 50 ug via INTRAVENOUS

## 2022-11-30 MED ORDER — PROPOFOL 10 MG/ML IV BOLUS
INTRAVENOUS | Status: AC
  Filled 2022-11-30: qty 20

## 2022-11-30 MED ORDER — DEXMEDETOMIDINE HCL IN NACL 80 MCG/20ML IV SOLN
INTRAVENOUS | Status: DC | PRN
  Administered 2022-11-30 (×3): 4 ug via INTRAVENOUS

## 2022-11-30 MED ORDER — OXYCODONE HCL 5 MG/5ML PO SOLN: 5.0000 mg | Freq: Once | ORAL | Status: AC | PRN

## 2022-11-30 MED ORDER — ORAL CARE MOUTH RINSE: 15.0000 mL | Freq: Once | OROMUCOSAL | Status: AC

## 2022-11-30 MED ORDER — ROCURONIUM BROMIDE 10 MG/ML (PF) SYRINGE
PREFILLED_SYRINGE | INTRAVENOUS | Status: AC
  Filled 2022-11-30: qty 10

## 2022-11-30 SURGICAL SUPPLY — 57 items
ADH SKN CLS APL DERMABOND .7 (GAUZE/BANDAGES/DRESSINGS)
ADH SKN CLS LQ APL DERMABOND (GAUZE/BANDAGES/DRESSINGS) ×1
APL PRP STRL LF DISP 70% ISPRP (MISCELLANEOUS) ×1
APL SRG 38 LTWT LNG FL B (MISCELLANEOUS)
APPLICATOR ARISTA FLEXITIP XL (MISCELLANEOUS) IMPLANT
APPLIER CLIP 5 13 M/L LIGAMAX5 (MISCELLANEOUS) ×1
APPLIER CLIP ROT 10 11.4 M/L (STAPLE)
APR CLP MED LRG 11.4X10 (STAPLE)
APR CLP MED LRG 5 ANG JAW (MISCELLANEOUS) ×1
BAG COUNTER SPONGE SURGICOUNT (BAG) IMPLANT
BAG SPEC RTRVL 10 TROC 200 (ENDOMECHANICALS) ×2
BAG SPNG CNTER NS LX DISP (BAG)
CABLE HIGH FREQUENCY MONO STRZ (ELECTRODE) ×2 IMPLANT
CHLORAPREP W/TINT 26 (MISCELLANEOUS) ×2 IMPLANT
CLIP APPLIE 5 13 M/L LIGAMAX5 (MISCELLANEOUS) IMPLANT
CLIP APPLIE ROT 10 11.4 M/L (STAPLE) IMPLANT
CLIP LIGATING HEMO O LOK GREEN (MISCELLANEOUS) IMPLANT
COVER MAYO STAND XLG (MISCELLANEOUS) IMPLANT
COVER SURGICAL LIGHT HANDLE (MISCELLANEOUS) ×2 IMPLANT
DERMABOND ADVANCED .7 DNX12 (GAUZE/BANDAGES/DRESSINGS) IMPLANT
DERMABOND ADVANCED .7 DNX6 (GAUZE/BANDAGES/DRESSINGS) IMPLANT
DRAPE C-ARM 42X120 X-RAY (DRAPES) IMPLANT
DRSG TEGADERM 2-3/8X2-3/4 SM (GAUZE/BANDAGES/DRESSINGS) ×6 IMPLANT
DRSG TEGADERM 4X4.75 (GAUZE/BANDAGES/DRESSINGS) ×2 IMPLANT
ELECT REM PT RETURN 15FT ADLT (MISCELLANEOUS) ×2 IMPLANT
GAUZE SPONGE 2X2 8PLY STRL LF (GAUZE/BANDAGES/DRESSINGS) ×2 IMPLANT
GLOVE BIO SURGEON STRL SZ7.5 (GLOVE) ×2 IMPLANT
GLOVE INDICATOR 8.0 STRL GRN (GLOVE) ×2 IMPLANT
GOWN STRL REUS W/ TWL XL LVL3 (GOWN DISPOSABLE) ×2 IMPLANT
GOWN STRL REUS W/TWL XL LVL3 (GOWN DISPOSABLE) ×1
GRASPER SUT TROCAR 14GX15 (MISCELLANEOUS) IMPLANT
HEMOSTAT ARISTA ABSORB 3G PWDR (HEMOSTASIS) IMPLANT
HEMOSTAT SNOW SURGICEL 2X4 (HEMOSTASIS) IMPLANT
IRRIG SUCT STRYKERFLOW 2 WTIP (MISCELLANEOUS) ×1
IRRIGATION SUCT STRKRFLW 2 WTP (MISCELLANEOUS) ×2 IMPLANT
KIT BASIN OR (CUSTOM PROCEDURE TRAY) ×2 IMPLANT
KIT TURNOVER KIT A (KITS) IMPLANT
L-HOOK LAP DISP 36CM (ELECTROSURGICAL)
LHOOK LAP DISP 36CM (ELECTROSURGICAL) IMPLANT
POUCH RETRIEVAL ECOSAC 10 (ENDOMECHANICALS) ×2 IMPLANT
SCISSORS LAP 5X35 DISP (ENDOMECHANICALS) ×2 IMPLANT
SET CHOLANGIOGRAPH MIX (MISCELLANEOUS) IMPLANT
SET TUBE SMOKE EVAC HIGH FLOW (TUBING) ×2 IMPLANT
SLEEVE ADV FIXATION 5X100MM (TROCAR) ×2 IMPLANT
SPIKE FLUID TRANSFER (MISCELLANEOUS) ×2 IMPLANT
STRIP CLOSURE SKIN 1/2X4 (GAUZE/BANDAGES/DRESSINGS) ×2 IMPLANT
SUT MNCRL AB 4-0 PS2 18 (SUTURE) ×2 IMPLANT
SUT VIC AB 0 UR5 27 (SUTURE) IMPLANT
SUT VICRYL 0 TIES 12 18 (SUTURE) IMPLANT
SUT VICRYL 0 UR6 27IN ABS (SUTURE) IMPLANT
TOWEL OR 17X26 10 PK STRL BLUE (TOWEL DISPOSABLE) ×2 IMPLANT
TOWEL OR NON WOVEN STRL DISP B (DISPOSABLE) ×2 IMPLANT
TRAY LAPAROSCOPIC (CUSTOM PROCEDURE TRAY) ×2 IMPLANT
TROCAR ADV FIXATION 12X100MM (TROCAR) IMPLANT
TROCAR ADV FIXATION 5X100MM (TROCAR) ×2 IMPLANT
TROCAR BALLN 12MMX100 BLUNT (TROCAR) IMPLANT
TROCAR XCEL NON-BLD 5MMX100MML (ENDOMECHANICALS) IMPLANT

## 2022-11-30 NOTE — Transfer of Care (Signed)
Immediate Anesthesia Transfer of Care Note  Patient: Peter Marsh  Procedure(s) Performed: LAPAROSCOPIC CHOLECYSTECTOMY WITH ICG DYE, LAPAROSCOPIC LYSIS OF ADHESIONS  Patient Location: PACU  Anesthesia Type:General  Level of Consciousness: awake, alert , and oriented  Airway & Oxygen Therapy: Patient Spontanous Breathing and Patient connected to face mask oxygen  Post-op Assessment: Report given to RN and Post -op Vital signs reviewed and stable  Post vital signs: Reviewed and stable  Last Vitals:  Vitals Value Taken Time  BP 198/118 11/30/22 1402  Temp 36.9 C 11/30/22 1402  Pulse 65 11/30/22 1405  Resp 17 11/30/22 1405  SpO2 100 % 11/30/22 1405  Vitals shown include unfiled device data.  Last Pain:  Vitals:   11/30/22 1402  TempSrc: Axillary  PainSc:          Complications: No notable events documented.

## 2022-11-30 NOTE — Discharge Instructions (Signed)
RESUME PLAVIX ON SATURDAY JULY 27TH  LAPAROSCOPIC SURGERY: POST OP INSTRUCTIONS Always review your discharge instruction sheet given to you by the facility where your surgery was performed. IF YOU HAVE DISABILITY OR FAMILY LEAVE FORMS, YOU MUST BRING THEM TO THE OFFICE FOR PROCESSING.   DO NOT GIVE THEM TO YOUR DOCTOR.  PAIN CONTROL  First take acetaminophen (Tylenol)  to control your pain after surgery.  Follow directions on package.  Taking acetaminophen (Tylenol)  regularly after surgery will help to control your pain and lower the amount of prescription pain medication you may need.  You should not take more than 3,000 mg (3 grams) of acetaminophen (Tylenol) in 24 hours.  You should not take ibuprofen (Advil), aleve, motrin, naprosyn or other NSAIDS if you have a history of stomach ulcers or chronic kidney disease.  A prescription for pain medication may be given to you upon discharge.  Take your pain medication as prescribed, if you still have uncontrolled pain after taking acetaminophen (Tylenol) or ibuprofen (Advil). Use ice packs to help control pain. If you need a refill on your pain medication, please contact your pharmacy.  They will contact our office to request authorization. Prescriptions will not be filled after 5pm or on week-ends.  HOME MEDICATIONS Take your usually prescribed medications unless otherwise directed.  DIET You should follow a light diet the first few days after arrival home.  Be sure to include lots of fluids daily. Avoid fatty, fried foods.   CONSTIPATION It is common to experience some constipation after surgery and if you are taking pain medication.  Increasing fluid intake and taking a stool softener (such as Colace) will usually help or prevent this problem from occurring.  A mild laxative (Milk of Magnesia or Miralax) should be taken according to package instructions if there are no bowel movements after 48 hours.  WOUND/INCISION CARE Most patients will  experience some swelling and bruising in the area of the incisions.  Ice packs will help.  Swelling and bruising can take several days to resolve.  Unless discharge instructions indicate otherwise, follow guidelines below  STERI-STRIPS - you may remove your outer bandages 48 hours after surgery, and you may shower at that time.  You have steri-strips (small skin tapes) in place directly over the incision.  These strips should be left on the skin for 7-10 days.   DERMABOND/SKIN GLUE - you may shower in 24 hours.  The glue will flake off over the next 2-3 weeks. Any sutures or staples will be removed at the office during your follow-up visit.  ACTIVITIES You may resume regular (light) daily activities beginning the next day--such as daily self-care, walking, climbing stairs--gradually increasing activities as tolerated.  You may have sexual intercourse when it is comfortable.  Refrain from any heavy lifting or straining until approved by your doctor. You may drive when you are no longer taking prescription pain medication, you can comfortably wear a seatbelt, and you can safely maneuver your car and apply brakes.  FOLLOW-UP You should see your doctor in the office for a follow-up appointment approximately 2-3 weeks after your surgery.  You should have been given your post-op/follow-up appointment when your surgery was scheduled.  If you did not receive a post-op/follow-up appointment, make sure that you call for this appointment within a day or two after you arrive home to insure a convenient appointment time.  OTHER INSTRUCTIONS   WHEN TO CALL YOUR DOCTOR: Fever over 101.0 Inability to urinate Continued bleeding from  incision. Increased pain, redness, or drainage from the incision. Increasing abdominal pain  The clinic staff is available to answer your questions during regular business hours.  Please don't hesitate to call and ask to speak to one of the nurses for clinical concerns.  If you  have a medical emergency, go to the nearest emergency room or call 911.  A surgeon from [facility] is always on call at the hospital. [address], [address], [place], [place]  [address] ? [address] [address], [place], [place]   [address] [phone] ? [phone] ? FAX [phone] Web site: [email or web address]

## 2022-11-30 NOTE — Telephone Encounter (Signed)
Pharmacy Patient Advocate Encounter   Received notification from CoverMyMeds that prior authorization for REPATHA is required/requested.   Insurance verification completed.   The patient is insured through [facility] .   Per test claim: PA required; PA submitted to [facility] via CoverMyMeds Key/confirmation #/EOC [id number] Status is pending

## 2022-11-30 NOTE — H&P (Signed)
REFERRING PHYSICIAN: Room, Emergency  PROVIDER: Cheston Coury Sherril Cong, MD  MRN: [id number] DOB: [birth year] DATE OF ENCOUNTER: 11/17/2022  Subjective   Chief Complaint: New Consultation (GALLBLADDER)   History of Present Illness: Peter Marsh is a 62 y.o. male who is seen today as an office consultation at the request of Dr. Room for evaluation of New Consultation (GALLBLADDER) .  He comes in today to discuss gallbladder surgery. He was hospitalized back in May with acute cholecystitis. He was not a candidate for surgery at that time due to the need that he needed to remain on his antiplatelet therapy due to a relatively new drug-eluting stent having been placed back in January. He was managed with IV antibiotics. He was not a candidate for cholecystostomy tube due to a contracted gallbladder. His symptoms resolved and he was discharged and finished out on oral antibiotics. He is accompanied by family ember today. He states that he had an episode of severe upper abdominal pain in his right upper quadrant last night that radiated to his back after eating a bowl of cereal which prompted him to go to the emergency room. He had nausea but no vomiting. He was evaluated with an ultrasound, CT and labs. He has not taking any of his blood pressure medication today. No headache or dizziness.  62 yo male with the past medical history of heart failure, atrial fibrillation, coronary artery disease, CKD, heart block sp pacemaker implantation, abdominal aortic aneurysm. hypertension, and schizophrenia who presented with abdominal pain. Reported one week of intermittent epigastric/ umbilical pain. On the day of hospitalization pain woke him up from his sleep at 3 am, prompting him to come to the ED.   Workup in ED showed   CT chest abdomen and pelvis with no evidence of acute intramural hematoma or dissection in the thoracoabdominal aorta.  11 x 8 mm penetrating ulcer or ulcerated plaque along the under surface  of the transverse aorta. 3,9 x 3,7 cm infrarenal abdominal aortic aneurysm.  Gallbladder wall ill defined, raising the question of pericholecystic edema. 6 mm gallstone evident.  Large central sinus cyst left kidney.  Bilateral non obstructing nephrolithiasis.  5.5 exophytic low density lesion upper pole left kidney has increased from 4,7 cm compared to 2022. Follow up CT with an without contrast recommended to exclude hypo- enhancing neoplasm.  Chronic pancreatitis.  Emphysema.   Abdominal US with cholelithiasis with gallbladder wall thickening, suspicious for cholecystitis.   IR consulted 5/27, felt gallbladder was decompressed and colon overlaying gallbladder with no window for [facility] catheter placement Slowly improving on antibiotics, supportive care   Patient not able to stop antiplatelet therapy in the setting of recent DES placement to LAD in January 2024. Per Dr. Erin Hearing note, he would favor allowing patient to complete 6 months of DAPT and consider surgery in the second half of July.  IR not able to insert percutaneous cholecystostomy drain due to poor windows.   Surgery was consulted and it was recommended to treat patient medically with supportive medical care and IV antibiotic therapy.    Review of Systems: A complete review of systems was obtained from the patient. I have reviewed this information and discussed as appropriate with the patient. See HPI as well for other ROS.  ROS   Medical History: Past Medical History:  Diagnosis Date  Anxiety  CHF (congestive heart failure) (CMS/HHS-[name])  Chronic kidney disease  COPD (chronic obstructive pulmonary disease) (CMS/HHS-[name])  GERD (gastroesophageal reflux disease)  History of myocardial infarction  History  of stroke  Hyperlipidemia  Hypertension   Patient Active Problem List  Diagnosis  Chronic kidney disease due to hypertension  Coronary artery disease involving native coronary artery of native heart without angina  pectoris  Infrarenal abdominal aortic aneurysm, without rupture (CMS-[name])   Past Surgical History:  Procedure Laterality Date  PACE MAKER  RUPTURED DISC    Not on File  Current Outpatient Medications on File Prior to Visit  Medication Sig Dispense Refill  amLODIPine (NORVASC) 5 MG tablet Take 5 mg by mouth once daily  aspirin 81 MG chewable tablet Take 81 mg by mouth once daily  budesonide-glycopyrrolate-formoterol (BREZTRI AEROSPHERE) 160-9-4.8 mcg/actuation inhaler INHALE 2 PUFFS BY MOUTH IN THE MORNING AND AT BEDTIME  buPROPion (WELLBUTRIN SR) 150 MG SR tablet  cloNIDine HCL (CATAPRES) 0.2 MG tablet Take 1 tablet by mouth 2 (two) times daily  clopidogreL (PLAVIX) 75 mg tablet Take 75 mg by mouth once daily  ezetimibe (ZETIA) 10 mg tablet Take 1 tablet by mouth once daily  hydrALAZINE (APRESOLINE) 50 MG tablet Take 1 tablet by mouth 3 (three) times daily  isosorbide dinitrate (ISORDIL) 30 MG tablet TAKE 1 TABLET(30 MG) BY MOUTH FOUR TIMES DAILY  metoprolol tartrate (LOPRESSOR) 50 MG tablet Take 1 tablet by mouth 2 (two) times daily  montelukast (SINGULAIR) 10 mg tablet  nitroGLYcerin (NITROSTAT) 0.4 MG SL tablet Place 0.4 mg under the tongue every 5 (five) minutes as needed  oxyCODONE (ROXICODONE) 5 MG immediate release tablet  paliperidone (INVEGA) 6 MG 24 hr tablet  pantoprazole (PROTONIX) 40 MG DR tablet Take 1 tablet by mouth 2 (two) times daily  sucralfate (CARAFATE) 1 gram tablet   No current facility-administered medications on file prior to visit.   Family History  Problem Relation Age of Onset  Obesity Mother  High blood pressure (Hypertension) Mother  Hyperlipidemia (Elevated cholesterol) Mother  Diabetes Mother  Hyperlipidemia (Elevated cholesterol) Father  Coronary Artery Disease (Blocked arteries around heart) Father    Social History   Tobacco Use  Smoking Status Former  Types: Cigarettes  Smokeless Tobacco Never    Social History   Socioeconomic  History  Marital status: Married  Tobacco Use  Smoking status: Former  Types: Cigarettes  Smokeless tobacco: Never  Vaping Use  Vaping status: Never Used  Substance and Sexual Activity  Drug use: Never   Social Determinants of Health   Financial Resource Strain: Low Risk (10/06/2022)  Received from [facility]  Overall Financial Resource Strain (CARDIA)  Difficulty of Paying Living Expenses: Not very hard  Food Insecurity: No Food Insecurity (10/06/2022)  [facility]  Hunger Vital Sign  Worried About Running Out of Food in the Last Year: Never true  Ran Out of Food in the Last Year: Never true  Transportation Needs: No Transportation Needs (10/06/2022)  Received from [facility] - Transportation  Lack of Transportation (Medical): No  Lack of Transportation (Non-Medical): No  Physical Activity: Insufficiently Active (10/06/2022)  Received from [facility]  Exercise Vital Sign  Days of Exercise per Week: 2 days  Minutes of Exercise per Session: 10 min  Stress: No Stress Concern Present (10/06/2022)  Received from [facility] - Occupational Stress Questionnaire  Feeling of Stress : Not at all  Social Connections: Moderately Integrated (10/06/2022)  Received from [facility]  Social Network  How would you rate your social network (family, work, friends)?: Adequate participation with social networks   Objective:   Vitals:  11/17/22 0909 11/17/22 1012  BP: (!) 175/124 (!) 160/90  Pulse: 71  Temp: 36.5 C (97.7 F)  SpO2: 96%  Weight: 88 kg (194 lb)  Height: 185.4 cm (6\' 1" )  PainSc: 4   Body mass index is 25.6 kg/m.  Constitutional: NAD; conversant; no deformities Eyes: Moist conjunctiva; no lid lag; anicteric; PERRL Neck: Trachea midline; no thyromegaly Lungs: Normal respiratory effort; no tactile fremitus CV: RRR; no palpable thrills; no pitting edema GI: Abd soft, nontender, nondistended;  no palpable hepatosplenomegaly MSK: Normal gait; no clubbing/cyanosis Psychiatric: Appropriate affect; alert and oriented x3 Lymphatic: No palpable cervical or axillary lymphadenopathy Skin: No rash, lesions or jaundice  Labs, Imaging and Diagnostic Testing: ED doctor note 10/01/22 & discharge summary 5/26-5/30 CT a/p  Abd u/s  Cardiology office note 11/15/22 Vascular surgery office note 11/02/22  CT 11/16/22 IMPRESSION: 1. Interposition of the distal stomach between the transverse colon and anterior abdominal wall. There is narrowing of the distal stomach as it passes over the transverse colon. A degree of luminal narrowing due to compression of the distal stomach by the colon is not excluded. 2. Cholelithiasis. 3. Nonobstructing bilateral renal calculi. Similar appearance of large right renal parapelvic cyst versus less likely hydronephrosis. 4. A 4 cm fusiform infrarenal abdominal aortic aneurysm. Recommend follow-up every 12 months and vascular consultation. This recommendation follows ACR consensus guidelines: White Paper of the ACR Incidental Findings Committee II on Vascular Findings. J Am Coll Radiol 2013; 16:109-604. 5. Aortic Atherosclerosis (ICD10-I70.0).  U/s 11/17/22 ER note July 12 Assessment and Plan:    Diagnoses and all orders for this visit:  Symptomatic cholelithiasis  Coronary artery disease involving native coronary artery of native heart without angina pectoris  Infrarenal abdominal aortic aneurysm, without rupture (CMS-[name])  Chronic kidney disease due to hypertension  Antiplatelet or antithrombotic long-term use    I believe the patient's symptoms are consistent with gallbladder disease. Even though he had a dilated stomach on imaging last night with a question of an interposition between his distal stomach and the colon may be causing a narrowing his symptoms are the same as to when he presented back in May with a normal-appearing stomach without  any signs of gastric outlet obstruction so I do not think he has some type of gastric outlet obstruction as a source of his right upper quadrant pain.  We discussed gallbladder disease. The patient was given Agricultural engineer. We discussed non-operative and operative management. We discussed the signs & symptoms of acute cholecystitis  I discussed laparoscopic cholecystectomy with possible IOC in detail. The patient was given educational material as well as diagrams detailing the procedure. We discussed the risks and benefits of a laparoscopic cholecystectomy including, but not limited to bleeding, infection, injury to surrounding structures such as the intestine or liver, bile leak, retained gallstones, need to convert to an open procedure, prolonged diarrhea, blood clots such as DVT, common bile duct injury, anesthesia risks, and possible need for additional procedures. We discussed the typical post-operative recovery course. I explained that the likelihood of improvement of their symptoms is good.  Explained to him and his family member that we will still need to get permission from cardiology to get him scheduled for surgery as well as permission for him to come off his antiplatelet therapy temporarily for surgery. Patient was encouraged to take his blood pressure medications when he gets home.  Did discuss that he is at slightly increased risk for complications due to his underlying cardiac disease and history  High level of medical decision making requiring extensive amount of medical data review as well as decision regarding surgery with patient identified risk factors No follow-ups on file.  Emalia Witkop Sherril Cong, MD  General, Minimally Invasive, & Bariatric Surgery

## 2022-11-30 NOTE — Op Note (Addendum)
ZADKIEL DRAGAN [id number] March 15, 1961 11/30/2022  Laparoscopic Cholecystectomy with near infrared fluorescent cholangiography + laparoscopic lysis of adhesions (20 minutes) procedure Note  Indications: This patient presents with symptomatic gallbladder disease and will undergo laparoscopic cholecystectomy.  Pre-operative Diagnosis: symptomatic cholelithiasis  Post-operative Diagnosis: same, probable chronic calculous cholecystitis, omental adhesions to abdominal wall  Surgeon: [name] MD FACS  Assistants: Carman Ching MD FACS (an assistant was requested due to the patient having contracted gallbladder and need to help with tissue retraction and manipulation)  Anesthesia: General endotracheal anesthesia  Procedure Details  The patient was seen again in the Holding Room. The risks, benefits, complications, treatment options, and expected outcomes were discussed with the patient. The possibilities of reaction to medication, pulmonary aspiration, perforation of viscus, bleeding, recurrent infection, finding a normal gallbladder, the need for additional procedures, failure to diagnose a condition, the possible need to convert to an open procedure, and creating a complication requiring transfusion or operation were discussed with the patient. The likelihood of improving the patient's symptoms with return to their baseline status is good.  The patient and/or family concurred with the proposed plan, giving informed consent. The site of surgery properly noted. The patient was taken to Operating Room, identified as [name] and the procedure verified as Laparoscopic Cholecystectomy with ICG dye.  A Time Out was held and the above information confirmed. Antibiotic prophylaxis was administered.    ICG dye was administered preoperatively.    General endotracheal anesthesia was then administered and tolerated well. After the induction, the abdomen was prepped with Chloraprep and draped in the  sterile fashion. The patient was positioned in the supine position.  Local anesthetic agent was injected into the skin near the umbilicus and an incision made. We dissected down to the abdominal fascia with blunt dissection.  The fascia was incised vertically and we entered the peritoneal cavity bluntly.  A pursestring suture of 0-Vicryl was placed around the fascial opening.  The Hasson cannula was inserted and secured with the stay suture.  Pneumoperitoneum was then created with CO2 and tolerated well without any adverse changes in the patient's vital signs.  Laparoscope was inserted and there was omentum adhered in this area but we were able to navigate the laparoscope through the omentum and get into a free area in the upper abdomen.  An 5-mm port was placed in the subxiphoid position.  Two 5-mm ports were placed in the right upper quadrant. All skin incisions were infiltrated with a local anesthetic agent before making the incision and placing the trocars.  The camera was placed in the subxiphoid position so we could look back at the upper midline in the umbilical area.  There is no evidence of injury to surrounding structure.  The patient had omentum stuck to the midline extending below the level of umbilicus.  We positioned the patient in reverse Trendelenburg, tilted slightly to the patient's left.  The patient had a very distended gaseous transverse colon.  The gallbladder was identified, the fundus grasped and retracted cephalad. Adhesions were lysed bluntly and with the electrocautery where indicated, taking care not to injure any adjacent organs or viscus. The infundibulum was grasped and retracted laterally, exposing the peritoneum overlying the triangle of Calot. This was then divided and exposed in a blunt fashion.  There was a fair amount of adipose tissue that had been encasing the infundibulum.  A critical view of the cystic duct and cystic artery was obtained.  The cystic duct was clearly  identified and bluntly dissected circumferentially.  Utilizing the St[employer]ker camera system near infrared fluorescent activity was visualized in the liver, & cystic duct.  There is no other infrared fluorescent activity visualized entering the gallbladder.  This served as a secondary confirmation of our anatomy.  The cystic duct was then ligated with clips and divided. The cystic artery which had been identified & dissected free was ligated with clips and divided as well.   The gallbladder was dissected from the liver bed in retrograde fashion with the electrocautery.  It was some bleeding from the gallbladder fossa along the right sort of side of the gallbladder fossa along the liver.  We increased the electrocautery and worked on achieving hemostasis.  Should be noted that the posterior wall of the gallbladder was entered while mobilizing it off the liver bed with some spillage of bile.  There were 2 gallstones which spilled which were extracted from the abdomen.  I then placed the camera on the right side of the abdomen and then took down the omental adhesions to the upper midline and around the umbilical trocar with EndoShears with and without electrocautery.  The gallbladder was removed and placed in an Ecco sac.  The gallbladder and Ecco sac were then removed through the umbilical port site. The liver bed was irrigated and inspected. Hemostasis was achieved with the electrocautery. Copious irrigation was utilized and was repeatedly aspirated until clear.  We did elect to place a piece of surgical snow in the gallbladder fossa.  The pursestring suture was used to close the umbilical fascia.    We again inspected the right upper quadrant for hemostasis.  The umbilical closure was inspected and there was no air leak and nothing trapped within the closure.  I did place an additional interrupted 0 Vicryl at the umbilical fascia using PMI suture passer with laparoscopic guidance.  I reinspected the omentum and  there is no evidence of bleeding.  Pneumoperitoneum was released as we removed the trocars.  4-0 Monocryl was used to close the skin.   Dermabond was applied. The patient was then extubated and brought to the recovery room in stable condition. Instrument, sponge, and needle counts were correct at closure and at the conclusion of the case.   Findings: OMental adhesions to the upper midline and umbilical area, positive critical view.  Positive snow  Estimated Blood Loss: <75ml         Drains: none         Specimens: Gallbladder           Complications: None; patient tolerated the procedure well.         Disposition: PACU - hemodynamically stable.         Condition: stable  Mary Sella. Andrey Campanile, MD, FACS General, Bariatric, & Minimally Invasive Surgery [facility],  [facility]

## 2022-11-30 NOTE — Interval H&P Note (Signed)
History and Physical Interval Note:  11/30/2022 11:42 AM  Peter Marsh  has presented today for surgery, with the diagnosis of SYMPTOMATIC CHOLELITHIASIS.  The various methods of treatment have been discussed with the patient and family. After consideration of risks, benefits and other options for treatment, the patient has consented to  Procedure(s): LAPAROSCOPIC CHOLECYSTECTOMY WITH ICG DYE (N/A) as a surgical intervention.  The patient's history has been reviewed, patient examined, no change in status, stable for surgery.  I have reviewed the patient's chart and labs.  Questions were answered to the patient's satisfaction.    Stopped plavix last Thursday  [name]

## 2022-11-30 NOTE — Anesthesia Postprocedure Evaluation (Signed)
Anesthesia Post Note  Patient: [facility]  Procedure(s) Performed: LAPAROSCOPIC CHOLECYSTECTOMY WITH ICG DYE, LAPAROSCOPIC LYSIS OF ADHESIONS     Patient location during evaluation: PACU Anesthesia Type: General Level of consciousness: awake and alert Pain management: pain level controlled Vital Signs Assessment: post-procedure vital signs reviewed and stable Respiratory status: spontaneous breathing, nonlabored ventilation, respiratory function stable and patient connected to nasal cannula oxygen Cardiovascular status: blood pressure returned to baseline Postop Assessment: no apparent nausea or vomiting Anesthetic complications: no  No notable events documented.  Last Vitals:  Vitals:   11/30/22 1435 11/30/22 1440  BP: (!) 199/91 (!) 180/96  Pulse: 70 (!) 59  Resp: 12 16  Temp:    SpO2: 98% 100%                  Beryle Lathe

## 2022-11-30 NOTE — Telephone Encounter (Signed)
Pharmacy Patient Advocate Encounter  Received notification from [facility] that Prior Authorization for REPATHA has been APPROVED THRU 05/08/23  Ran test claim, Copay is $0.

## 2022-11-30 NOTE — Anesthesia Procedure Notes (Signed)
Procedure Name: Intubation Date/Time: 11/30/2022 12:28 PM  Performed by: Kleigh Hoelzer D, CRNAPre-anesthesia Checklist: Patient identified, Emergency Drugs available, Suction available and Patient being monitored Patient Re-evaluated:Patient Re-evaluated prior to induction Oxygen Delivery Method: Circle system utilized Preoxygenation: Pre-oxygenation with 100% oxygen Induction Type: IV induction Ventilation: Mask ventilation without difficulty Laryngoscope Size: Mac and 4 Grade View: Grade I Tube type: Oral Tube size: 7.5 mm Number of attempts: 1 Airway Equipment and Method: Stylet and Oral airway Placement Confirmation: ETT inserted through vocal cords under direct vision, positive ETCO2 and breath sounds checked- equal and bilateral Secured at: 22 cm Tube secured with: Tape Dental Injury: Teeth and Oropharynx as per pre-operative assessment

## 2022-12-01 ENCOUNTER — Encounter (HOSPITAL_COMMUNITY): Payer: Self-pay | Admitting: General Surgery

## 2022-12-14 ENCOUNTER — Encounter: Payer: 59 | Admitting: Urology

## 2022-12-20 ENCOUNTER — Encounter: Payer: 59 | Admitting: Urology

## 2022-12-25 ENCOUNTER — Ambulatory Visit: Payer: Self-pay

## 2023-01-05 ENCOUNTER — Other Ambulatory Visit: Payer: Self-pay | Admitting: General Practice

## 2023-01-05 DIAGNOSIS — I10 Essential (primary) hypertension: Secondary | ICD-10-CM

## 2023-01-17 ENCOUNTER — Telehealth (HOSPITAL_BASED_OUTPATIENT_CLINIC_OR_DEPARTMENT_OTHER): Payer: Self-pay | Admitting: *Deleted

## 2023-01-17 NOTE — Telephone Encounter (Signed)
Will update requesting office in regard to procedure will need to be postponed. See notes per Edd Fabian, FNP.   Requesting will need to fax new clearance request when ready to schedule once pt is able to hold blood thinners.

## 2023-01-17 NOTE — Telephone Encounter (Signed)
   Pre-operative Risk Assessment    Patient Name: Peter Marsh  DOB: February 23, 1961 MRN: [id number]      Request for Surgical Clearance    Procedure:   Colonoscopy  Date of Surgery:  Clearance 02/16/23                                 Surgeon:  None Indicated Surgeon's Group or Practice Name:  [facility] Phone number:  [phone] Fax number:  [phone]   Type of Clearance Requested:   - Medical  - Pharmacy:  Hold Aspirin and Clopidogrel (Plavix) 4 days prior   Type of Anesthesia:  Not Indicated   Additional requests/questions:    Signed, Emmit Pomfret   01/17/2023, 9:23 AM

## 2023-01-17 NOTE — Telephone Encounter (Signed)
Preoperative team, patient had PCI with stenting 1/24.  He will not be able to/eligible to hold his dual antiplatelet therapy until 12 months post stenting.  Interruption of his dual antiplatelet therapy could compromise his stent.  Please contact requesting office and let them know that he will not be eligible to undergo procedure until 12 months after his stent was placed.  Thank you for your help.  Thomasene Ripple. Kline Bulthuis NP-C     01/17/2023, 10:16 AM [facility] [address] Office [phone] Fax [phone]

## 2023-01-17 NOTE — Telephone Encounter (Signed)
Will hold on device clearance then until procedure is rescheduled.  Thanks.

## 2023-02-02 ENCOUNTER — Emergency Department (HOSPITAL_COMMUNITY)
Admission: EM | Admit: 2023-02-02 | Discharge: 2023-02-02 | Disposition: A | Discharge status: Home / Self Care | Payer: 59

## 2023-02-02 NOTE — ED Triage Notes (Signed)
The pt decided not to continue with this admission to the ed   he said too many people were waiting  he would  come back another time

## 2023-02-05 ENCOUNTER — Telehealth: Payer: Self-pay | Admitting: Cardiovascular Disease

## 2023-02-05 NOTE — Telephone Encounter (Signed)
Patient would be reached out as soon as possible when I spoke to [employer]

## 2023-02-05 NOTE — Telephone Encounter (Signed)
Patient into office.  Wrote information and asked to be called After 6:15.   Called patient and states "number cannot be completed as dialed" .  Called three times and had other nurse as well.  Called to wife and LM to call our office.

## 2023-02-05 NOTE — Telephone Encounter (Signed)
Patient came in due to medication reaction. Wanted to be seen sooner. Spoke to [employer].Alvino Chapel

## 2023-02-06 NOTE — Telephone Encounter (Signed)
Patient is returning call.  °

## 2023-02-06 NOTE — Telephone Encounter (Signed)
Patient states "allergic reaction " to Plavix.  He states he has dark spots all over , "bruising" and hasn't hit anything. Advised that this is a blood thinner and can cause bruising.  He would like to see the provider to ensure everything is OK.  Appt made for Monday to see PA

## 2023-02-06 NOTE — Telephone Encounter (Signed)
Call to patient. Call goes to VM.  VM is full, unable to LM.

## 2023-02-12 ENCOUNTER — Encounter: Payer: Self-pay | Admitting: Cardiology

## 2023-02-12 ENCOUNTER — Ambulatory Visit: Payer: 59 | Attending: Cardiology | Admitting: Cardiology

## 2023-02-12 VITALS — BP 156/96 | HR 67 | Ht 73.0 in | Wt 197.0 lb

## 2023-02-12 DIAGNOSIS — I10 Essential (primary) hypertension: Secondary | ICD-10-CM

## 2023-02-12 DIAGNOSIS — T466X5D Adverse effect of antihyperlipidemic and antiarteriosclerotic drugs, subsequent encounter: Secondary | ICD-10-CM

## 2023-02-12 DIAGNOSIS — I255 Ischemic cardiomyopathy: Secondary | ICD-10-CM

## 2023-02-12 DIAGNOSIS — I2510 Atherosclerotic heart disease of native coronary artery without angina pectoris: Secondary | ICD-10-CM | POA: Diagnosis not present

## 2023-02-12 DIAGNOSIS — Z950 Presence of cardiac pacemaker: Secondary | ICD-10-CM

## 2023-02-12 DIAGNOSIS — M791 Myalgia, unspecified site: Secondary | ICD-10-CM | POA: Diagnosis not present

## 2023-02-12 DIAGNOSIS — I447 Left bundle-branch block, unspecified: Secondary | ICD-10-CM

## 2023-02-12 DIAGNOSIS — E782 Mixed hyperlipidemia: Secondary | ICD-10-CM | POA: Diagnosis not present

## 2023-02-12 DIAGNOSIS — N184 Chronic kidney disease, stage 4 (severe): Secondary | ICD-10-CM

## 2023-02-12 DIAGNOSIS — I7143 Infrarenal abdominal aortic aneurysm, without rupture: Secondary | ICD-10-CM

## 2023-02-12 NOTE — Progress Notes (Signed)
because he had some chills earlier today. He is afebrile in clinic today. Denies cough, nasal congestion. He does report some body aches. He checks his BP at home and it usually well controlled. BP elevated in clinic today. He is somewhat anxious in the exam room today    Studies Reviewed: .   Cardiac Studies & Procedures   CARDIAC  CATHETERIZATION  CARDIAC CATHETERIZATION 05/26/2022  Narrative   Prox RCA lesion is 25% stenosed.   Prox LAD lesion is 99% stenosed.  A drug-eluting stent was successfully placed using a SYNERGY XD 3.50X24 postdilated to greater than 4 m in diameter and optimized with intravascular ultrasound..   Post intervention, there is a 0% residual stenosis.   LV end diastolic pressure is normal.   There is no aortic valve stenosis.  Successful PCI of the severe lesion in the proximal LAD.  Continue dual antiplatelet therapy for at least 12 months.  Continue aggressive secondary prevention.  Will decrease his dose of isosorbide from 4 times a day down to 3 times a day.  This can gradually be weaned off based on his symptoms.  Watch overnight.  Hydrate aggressively due to renal insufficiency.  Specific emphasis was placed on minimizing contrast.  Only 50 cc of contrast was used for the procedure.  Plan for discharge tomorrow.  Findings Coronary Findings Diagnostic  Dominance: Right  Left Anterior Descending Collaterals Dist LAD filled by collaterals from 2nd Diag.  Prox LAD lesion is 99% stenosed.  Right Coronary Artery Prox RCA lesion is 25% stenosed.  Intervention  Prox LAD lesion Stent CATH LAUNCHER 6FR EBU 3.5 SH guide catheter was inserted. Lesion crossed with guidewire using a WIRE RUNTHROUGH .[id number]. Pre-stent angioplasty was performed using a BALLN EMERGE MR 2.0X15. A drug-eluting stent was successfully placed using a SYNERGY XD 3.50X24. Stent strut is well apposed. Post-stent angioplasty was performed using a BALLN [place] EMERGE MR 4.0X8. Initially, 3.75 [place] balloon was used for postdilatation.  And 1 ectatic segment of the LAD just after the lesion, there appeared to be some malapposition of the stent.  A 4.0 [place] balloon was then used to dilate in that area. Post-Intervention Lesion Assessment The intervention was successful. Pre-interventional TIMI flow is 2. Post-intervention TIMI flow is  3. No complications occurred at this lesion. Ultrasound (IVUS) was performed on the lesion post PCI using a CATH OPTICROSS HD. Stent well expanded. There is a 0% residual stenosis post intervention.   STRESS TESTS  MYOCARDIAL PERFUSION IMAGING 03/13/2016   ECHOCARDIOGRAM  ECHOCARDIOGRAM COMPLETE 10/02/2022  Narrative ECHOCARDIOGRAM REPORT    Patient Name:   Peter Marsh Date of Exam: 10/02/2022 Medical Rec #:  [id number]       Height:       73.0 in Accession #:    [id number]      Weight:       185.7 lb Date of Birth:  18-Jan-1961       BSA:          2.085 m Patient Age:    61 years        BP:           131/93 mmHg Patient Gender: M               HR:           69 bpm. Exam Location:  Inpatient  Procedure: 2D Echo, Cardiac Doppler and Color Doppler  Indications:    CAD Native Vessel I25.10  History:  because he had some chills earlier today. He is afebrile in clinic today. Denies cough, nasal congestion. He does report some body aches. He checks his BP at home and it usually well controlled. BP elevated in clinic today. He is somewhat anxious in the exam room today    Studies Reviewed: .   Cardiac Studies & Procedures   CARDIAC  CATHETERIZATION  CARDIAC CATHETERIZATION 05/26/2022  Narrative   Prox RCA lesion is 25% stenosed.   Prox LAD lesion is 99% stenosed.  A drug-eluting stent was successfully placed using a SYNERGY XD 3.50X24 postdilated to greater than 4 m in diameter and optimized with intravascular ultrasound..   Post intervention, there is a 0% residual stenosis.   LV end diastolic pressure is normal.   There is no aortic valve stenosis.  Successful PCI of the severe lesion in the proximal LAD.  Continue dual antiplatelet therapy for at least 12 months.  Continue aggressive secondary prevention.  Will decrease his dose of isosorbide from 4 times a day down to 3 times a day.  This can gradually be weaned off based on his symptoms.  Watch overnight.  Hydrate aggressively due to renal insufficiency.  Specific emphasis was placed on minimizing contrast.  Only 50 cc of contrast was used for the procedure.  Plan for discharge tomorrow.  Findings Coronary Findings Diagnostic  Dominance: Right  Left Anterior Descending Collaterals Dist LAD filled by collaterals from 2nd Diag.  Prox LAD lesion is 99% stenosed.  Right Coronary Artery Prox RCA lesion is 25% stenosed.  Intervention  Prox LAD lesion Stent CATH LAUNCHER 6FR EBU 3.5 SH guide catheter was inserted. Lesion crossed with guidewire using a WIRE RUNTHROUGH .[id number]. Pre-stent angioplasty was performed using a BALLN EMERGE MR 2.0X15. A drug-eluting stent was successfully placed using a SYNERGY XD 3.50X24. Stent strut is well apposed. Post-stent angioplasty was performed using a BALLN [place] EMERGE MR 4.0X8. Initially, 3.75 [place] balloon was used for postdilatation.  And 1 ectatic segment of the LAD just after the lesion, there appeared to be some malapposition of the stent.  A 4.0 [place] balloon was then used to dilate in that area. Post-Intervention Lesion Assessment The intervention was successful. Pre-interventional TIMI flow is 2. Post-intervention TIMI flow is  3. No complications occurred at this lesion. Ultrasound (IVUS) was performed on the lesion post PCI using a CATH OPTICROSS HD. Stent well expanded. There is a 0% residual stenosis post intervention.   STRESS TESTS  MYOCARDIAL PERFUSION IMAGING 03/13/2016   ECHOCARDIOGRAM  ECHOCARDIOGRAM COMPLETE 10/02/2022  Narrative ECHOCARDIOGRAM REPORT    Patient Name:   Peter Marsh Date of Exam: 10/02/2022 Medical Rec #:  [id number]       Height:       73.0 in Accession #:    [id number]      Weight:       185.7 lb Date of Birth:  18-Jan-1961       BSA:          2.085 m Patient Age:    61 years        BP:           131/93 mmHg Patient Gender: M               HR:           69 bpm. Exam Location:  Inpatient  Procedure: 2D Echo, Cardiac Doppler and Color Doppler  Indications:    CAD Native Vessel I25.10  History:  because he had some chills earlier today. He is afebrile in clinic today. Denies cough, nasal congestion. He does report some body aches. He checks his BP at home and it usually well controlled. BP elevated in clinic today. He is somewhat anxious in the exam room today    Studies Reviewed: .   Cardiac Studies & Procedures   CARDIAC  CATHETERIZATION  CARDIAC CATHETERIZATION 05/26/2022  Narrative   Prox RCA lesion is 25% stenosed.   Prox LAD lesion is 99% stenosed.  A drug-eluting stent was successfully placed using a SYNERGY XD 3.50X24 postdilated to greater than 4 m in diameter and optimized with intravascular ultrasound..   Post intervention, there is a 0% residual stenosis.   LV end diastolic pressure is normal.   There is no aortic valve stenosis.  Successful PCI of the severe lesion in the proximal LAD.  Continue dual antiplatelet therapy for at least 12 months.  Continue aggressive secondary prevention.  Will decrease his dose of isosorbide from 4 times a day down to 3 times a day.  This can gradually be weaned off based on his symptoms.  Watch overnight.  Hydrate aggressively due to renal insufficiency.  Specific emphasis was placed on minimizing contrast.  Only 50 cc of contrast was used for the procedure.  Plan for discharge tomorrow.  Findings Coronary Findings Diagnostic  Dominance: Right  Left Anterior Descending Collaterals Dist LAD filled by collaterals from 2nd Diag.  Prox LAD lesion is 99% stenosed.  Right Coronary Artery Prox RCA lesion is 25% stenosed.  Intervention  Prox LAD lesion Stent CATH LAUNCHER 6FR EBU 3.5 SH guide catheter was inserted. Lesion crossed with guidewire using a WIRE RUNTHROUGH .[id number]. Pre-stent angioplasty was performed using a BALLN EMERGE MR 2.0X15. A drug-eluting stent was successfully placed using a SYNERGY XD 3.50X24. Stent strut is well apposed. Post-stent angioplasty was performed using a BALLN [place] EMERGE MR 4.0X8. Initially, 3.75 [place] balloon was used for postdilatation.  And 1 ectatic segment of the LAD just after the lesion, there appeared to be some malapposition of the stent.  A 4.0 [place] balloon was then used to dilate in that area. Post-Intervention Lesion Assessment The intervention was successful. Pre-interventional TIMI flow is 2. Post-intervention TIMI flow is  3. No complications occurred at this lesion. Ultrasound (IVUS) was performed on the lesion post PCI using a CATH OPTICROSS HD. Stent well expanded. There is a 0% residual stenosis post intervention.   STRESS TESTS  MYOCARDIAL PERFUSION IMAGING 03/13/2016   ECHOCARDIOGRAM  ECHOCARDIOGRAM COMPLETE 10/02/2022  Narrative ECHOCARDIOGRAM REPORT    Patient Name:   Peter Marsh Date of Exam: 10/02/2022 Medical Rec #:  [id number]       Height:       73.0 in Accession #:    [id number]      Weight:       185.7 lb Date of Birth:  18-Jan-1961       BSA:          2.085 m Patient Age:    61 years        BP:           131/93 mmHg Patient Gender: M               HR:           69 bpm. Exam Location:  Inpatient  Procedure: 2D Echo, Cardiac Doppler and Color Doppler  Indications:    CAD Native Vessel I25.10  History:  because he had some chills earlier today. He is afebrile in clinic today. Denies cough, nasal congestion. He does report some body aches. He checks his BP at home and it usually well controlled. BP elevated in clinic today. He is somewhat anxious in the exam room today    Studies Reviewed: .   Cardiac Studies & Procedures   CARDIAC  CATHETERIZATION  CARDIAC CATHETERIZATION 05/26/2022  Narrative   Prox RCA lesion is 25% stenosed.   Prox LAD lesion is 99% stenosed.  A drug-eluting stent was successfully placed using a SYNERGY XD 3.50X24 postdilated to greater than 4 m in diameter and optimized with intravascular ultrasound..   Post intervention, there is a 0% residual stenosis.   LV end diastolic pressure is normal.   There is no aortic valve stenosis.  Successful PCI of the severe lesion in the proximal LAD.  Continue dual antiplatelet therapy for at least 12 months.  Continue aggressive secondary prevention.  Will decrease his dose of isosorbide from 4 times a day down to 3 times a day.  This can gradually be weaned off based on his symptoms.  Watch overnight.  Hydrate aggressively due to renal insufficiency.  Specific emphasis was placed on minimizing contrast.  Only 50 cc of contrast was used for the procedure.  Plan for discharge tomorrow.  Findings Coronary Findings Diagnostic  Dominance: Right  Left Anterior Descending Collaterals Dist LAD filled by collaterals from 2nd Diag.  Prox LAD lesion is 99% stenosed.  Right Coronary Artery Prox RCA lesion is 25% stenosed.  Intervention  Prox LAD lesion Stent CATH LAUNCHER 6FR EBU 3.5 SH guide catheter was inserted. Lesion crossed with guidewire using a WIRE RUNTHROUGH .[id number]. Pre-stent angioplasty was performed using a BALLN EMERGE MR 2.0X15. A drug-eluting stent was successfully placed using a SYNERGY XD 3.50X24. Stent strut is well apposed. Post-stent angioplasty was performed using a BALLN [place] EMERGE MR 4.0X8. Initially, 3.75 [place] balloon was used for postdilatation.  And 1 ectatic segment of the LAD just after the lesion, there appeared to be some malapposition of the stent.  A 4.0 [place] balloon was then used to dilate in that area. Post-Intervention Lesion Assessment The intervention was successful. Pre-interventional TIMI flow is 2. Post-intervention TIMI flow is  3. No complications occurred at this lesion. Ultrasound (IVUS) was performed on the lesion post PCI using a CATH OPTICROSS HD. Stent well expanded. There is a 0% residual stenosis post intervention.   STRESS TESTS  MYOCARDIAL PERFUSION IMAGING 03/13/2016   ECHOCARDIOGRAM  ECHOCARDIOGRAM COMPLETE 10/02/2022  Narrative ECHOCARDIOGRAM REPORT    Patient Name:   Peter Marsh Date of Exam: 10/02/2022 Medical Rec #:  [id number]       Height:       73.0 in Accession #:    [id number]      Weight:       185.7 lb Date of Birth:  18-Jan-1961       BSA:          2.085 m Patient Age:    61 years        BP:           131/93 mmHg Patient Gender: M               HR:           69 bpm. Exam Location:  Inpatient  Procedure: 2D Echo, Cardiac Doppler and Color Doppler  Indications:    CAD Native Vessel I25.10  History:  Cardiology Office Note:  .   Date:  02/12/2023  ID:  [name], DOB [birth year], MRN [id number] PCP: Tracey Harries, MD  [facility] Providers Cardiologist:  Reatha Harps, MD Electrophysiologist:  Lewayne Bunting, MD  History of Present Illness: .   Peter Marsh is a 62 y.o. male with a history of CAD, hyperlipidemia, ischemic cardiomyopathy, LBBB, hypertension, CKD, paroxysmal atrial fibrillation/flutter, history of PPM, penetrating aortic ulcer and AAA.  Patient is followed by Dr. Flora Lipps, Dr. Ladona Ridgel and presents today to discuss Plavix.  Patient was referred to Dr. Ladona Ridgel in 02/2017 for atrial flutter.  Diagnosis was felt to be bundle branch reentrant VT and not atrial flutter, plan was made for EP study with possible catheter ablation versus PPM.  He underwent EP study in 02/2017 that showed no inducible SVT, VT, atrial fibrillation, or atrial flutter.  He underwent successful implantation of Medtronic dual-chamber PPM secondary to unexplained syncope, left bundle branch block with intracardiac EKG evidence of severe conduction system disease.  In 05/2022, patient underwent cardiac catheterization with PCI/DES to his proximal LAD.  Echocardiogram at that time showed EF 40-45%, grade 1 diastolic dysfunction.  Of note, patient has allergy to statin medications and is followed by the lipid clinic.  Has previously tried to do Repatha but was unable to take the medicine.  Initially thought to be a good candidate for inclisiran, however his insurance would required him to pay $3600 out-of-pocket.  He was admitted in 09/2022 for abdominal pain.  Cardiology was consulted regarding holding DAPT in the setting of cholecystitis for possible surgery.  As patient had PCI to his proximal LAD in 05/2022, it was recommended that DAPT not be held.  Initially plans to have a PERC catheter placement, but there was no window with decompressed gallbladder.  Instead, patient was started on antibiotics and  supportive care.  He was discharged on 10/05/2022 with plans to follow-up with general surgery for eventual cholecystectomy.  It was recommended that patient complete at least 6 months of DAPT and consider surgery later this year.  In his workup during that admission, it was noted that patient had a penetrating aortic ulcer and stable AAA.  Patient is now followed by Dr. Durwin Nora with vascular surgery.  Patient was seen by Dr. Durwin Nora on 11/02/2022.  He underwent CTA of the chest that showed no change in the penetrating aortic ulcer along the inferior aspect of the transverse thoracic aorta.  Patient was asymptomatic.  Recommended that he undergo follow-up CTA of the chest in 1 year and will have repeat ultrasound of his aortic aneurysm at that time.  Patient was seen in the clinic on 11/15/2022.  At that time, patient was doing very well and denied further episodes of abdominal pain.  He underwent laparoscopic cholecystectomy on 12/21/2022.  Today, patient presents to the clinic because he is concerned that he is having bruising on plavix. He has been noticing bruises on his arms and legs, and he cannot recall bumping into anything. Bruising has been going on for about 2 weeks. For the past 2 weeks, he has been feeling somewhat fatigued and has had some body aches. Has felt a bit nauseous as well. Denies vomiting. Was having diarrhea, but has been constipated for the past 4 days. When he was having diarrhea, he did notice a bit of blood in his stool. Reports history of hemorrhoids that were partially treated with banding. Denies bleeding otherwise. He is worried that he might be "sick"  because he had some chills earlier today. He is afebrile in clinic today. Denies cough, nasal congestion. He does report some body aches. He checks his BP at home and it usually well controlled. BP elevated in clinic today. He is somewhat anxious in the exam room today    Studies Reviewed: .   Cardiac Studies & Procedures   CARDIAC  CATHETERIZATION  CARDIAC CATHETERIZATION 05/26/2022  Narrative   Prox RCA lesion is 25% stenosed.   Prox LAD lesion is 99% stenosed.  A drug-eluting stent was successfully placed using a SYNERGY XD 3.50X24 postdilated to greater than 4 m in diameter and optimized with intravascular ultrasound..   Post intervention, there is a 0% residual stenosis.   LV end diastolic pressure is normal.   There is no aortic valve stenosis.  Successful PCI of the severe lesion in the proximal LAD.  Continue dual antiplatelet therapy for at least 12 months.  Continue aggressive secondary prevention.  Will decrease his dose of isosorbide from 4 times a day down to 3 times a day.  This can gradually be weaned off based on his symptoms.  Watch overnight.  Hydrate aggressively due to renal insufficiency.  Specific emphasis was placed on minimizing contrast.  Only 50 cc of contrast was used for the procedure.  Plan for discharge tomorrow.  Findings Coronary Findings Diagnostic  Dominance: Right  Left Anterior Descending Collaterals Dist LAD filled by collaterals from 2nd Diag.  Prox LAD lesion is 99% stenosed.  Right Coronary Artery Prox RCA lesion is 25% stenosed.  Intervention  Prox LAD lesion Stent CATH LAUNCHER 6FR EBU 3.5 SH guide catheter was inserted. Lesion crossed with guidewire using a WIRE [place]THR[place] .[id number]. Pre-stent angioplasty was performed using a BALLN EMERGE MR 2.0X15. A drug-eluting stent was successfully placed using a SYNERGY XD 3.50X24. Stent strut is well apposed. Post-stent angioplasty was performed using a BALLN [place] EMERGE MR 4.0X8. Initially, 3.75 [place] balloon was used for postdilatation.  And 1 ectatic segment of the LAD just after the lesion, there appeared to be some malapposition of the stent.  A 4.0 [place] balloon was then used to dilate in that area. Post-Intervention Lesion Assessment The intervention was successful. Pre-interventional TIMI flow is 2. Post-intervention TIMI flow is  3. No complications occurred at this lesion. Ultrasound (IVUS) was performed on the lesion post PCI using a CATH OPTICROSS HD. Stent well expanded. There is a 0% residual stenosis post intervention.   STRESS TESTS  MYOCARDIAL PERFUSION IMAGING 03/13/2016   ECHOCARDIOGRAM  ECHOCARDIOGRAM COMPLETE 10/02/2022  Narrative ECHOCARDIOGRAM REPORT    Patient Name:   Peter Marsh Date of Exam: 10/02/2022 Medical Rec #:  [id number]       Height:       73.0 in Accession #:    [id number]      Weight:       185.7 lb Date of Birth:  18-Jan-1961       BSA:          2.085 m Patient Age:    61 years        BP:           131/93 mmHg Patient Gender: M               HR:           69 bpm. Exam Location:  Inpatient  Procedure: 2D Echo, Cardiac Doppler and Color Doppler  Indications:    CAD Native Vessel I25.10  History:

## 2023-02-12 NOTE — Patient Instructions (Addendum)
Medication Instructions:  No Changes *If you need a refill on your cardiac medications before your next appointment, please call your pharmacy*   Lab Work: Today: CBC, BMP, and BNP If you have labs (blood work) drawn today and your tests are completely normal, you will receive your results only by: MyChart Message (if you have MyChart) OR A paper copy in the mail If you have any lab test that is abnormal or we need to change your treatment, we will call you to review the results.   Testing/Procedures: No Testing   Follow-Up: At [facility], you and your health needs are our priority.  As part of our continuing mission to provide you with exceptional heart care, we have created designated Provider Care Teams.  These Care Teams include your primary Cardiologist (physician) and Advanced Practice Providers (APPs -  Physician Assistants and Nurse Practitioners) who all work together to provide you with the care you need, when you need it.  We recommend signing up for the patient portal called "MyChart".  Sign up information is provided on this After Visit Summary.  MyChart is used to connect with patients for Virtual Visits (Telemedicine).  Patients are able to view lab/test results, encounter notes, upcoming appointments, etc.  Non-urgent messages can be sent to your provider as well.   To learn more about what you can do with MyChart, go to [email or web address].    Your next appointment:   November 15th at 3:40  Provider:   Dr Flora Lipps  Other Instructions Keep blood pressure log for the next 2 weeks, Take blood pressure twice a day sitting down in a chair with arm elevated and resting.

## 2023-02-13 LAB — BASIC METABOLIC PANEL
BUN/Creatinine Ratio: 10 (ref 10–24)
BUN: 18 mg/dL (ref 8–27)
CO2: 23 mmol/L (ref 20–29)
Calcium: 10 mg/dL (ref 8.6–10.2)
Chloride: 101 mmol/L (ref 96–106)
Creatinine, Ser: 1.78 mg/dL — ABNORMAL HIGH (ref 0.76–1.27)
Glucose: 93 mg/dL (ref 70–99)
Potassium: 4.4 mmol/L (ref 3.5–5.2)
Sodium: 139 mmol/L (ref 134–144)
eGFR: 42 mL/min/{1.73_m2} — ABNORMAL LOW (ref >59)

## 2023-02-13 LAB — CBC
Hematocrit: 42.4 % (ref 37.5–51.0)
Hemoglobin: 13.6 g/dL (ref 13.0–17.7)
MCH: 29.2 pg (ref 26.6–33.0)
MCHC: 32.1 g/dL (ref 31.5–35.7)
MCV: 91 fL (ref 79–97)
Platelets: 226 10*3/uL (ref 150–450)
RBC: 4.66 x10E6/uL (ref 4.14–5.80)
RDW: 12.9 % (ref 11.6–15.4)
WBC: 7.2 10*3/uL (ref 3.4–10.8)

## 2023-02-13 LAB — BRAIN NATRIURETIC PEPTIDE: BNP: 32.2 pg/mL (ref 0.0–100.0)

## 2023-03-02 ENCOUNTER — Ambulatory Visit (HOSPITAL_COMMUNITY): Admission: EM | Admit: 2023-03-02 | Discharge: 2023-03-02 | Discharge status: Against Medical Advice | Payer: 59

## 2023-03-02 NOTE — ED Notes (Signed)
Patient left prior to being triaged.   

## 2023-03-02 NOTE — Progress Notes (Signed)
   03/02/23 1023  [facility] Triage Screening (Walk-ins at [facility] only)  How Did You Hear About [place]? Family/Friend (UTA)  What Is the Reason for Your Visit/Call Today? Patient left before being triaged. Unable to assess.  How Long Has This Been Causing You Problems?  (UTA)  Have You Recently Had Any Thoughts About Hurting Yourself?  (UTA)  Are You Planning to Commit Suicide/Harm Yourself At This time?  (UTA)  Have you Recently Had Thoughts About Hurting Someone Else?  (UTA)  Are You Planning To Harm Someone At This Time?  (UTA)  Are you currently experiencing any auditory, visual or other hallucinations?  (UTA)  Have You Used Any Alcohol or Drugs in the Past 24 Hours?  (UTA)  Do you have any current medical co-morbidities that require immediate attention?  (UTA)  Clinician description of patient physical appearance/behavior:  (UTA)  What Do You Feel Would Help You the Most Today?  (UTA)  If access to [facility] was not available, would you have sought care in the Emergency Department?  (UTA)  Determination of Need  (UTA)  Options For Referral  (UTA)

## 2023-03-11 ENCOUNTER — Encounter (HOSPITAL_COMMUNITY): Payer: Self-pay | Admitting: Emergency Medicine

## 2023-03-11 ENCOUNTER — Emergency Department (HOSPITAL_COMMUNITY): Payer: 59

## 2023-03-11 ENCOUNTER — Other Ambulatory Visit: Payer: Self-pay

## 2023-03-11 ENCOUNTER — Emergency Department (HOSPITAL_COMMUNITY)
Admission: EM | Admit: 2023-03-11 | Discharge: 2023-03-12 | Disposition: A | Discharge status: Home / Self Care | Payer: 59 | Attending: Emergency Medicine | Admitting: Emergency Medicine

## 2023-03-11 DIAGNOSIS — R0602 Shortness of breath: Secondary | ICD-10-CM | POA: Insufficient documentation

## 2023-03-11 DIAGNOSIS — R1084 Generalized abdominal pain: Secondary | ICD-10-CM | POA: Insufficient documentation

## 2023-03-11 DIAGNOSIS — N1832 Chronic kidney disease, stage 3b: Secondary | ICD-10-CM | POA: Insufficient documentation

## 2023-03-11 DIAGNOSIS — Z7982 Long term (current) use of aspirin: Secondary | ICD-10-CM | POA: Diagnosis not present

## 2023-03-11 DIAGNOSIS — R0789 Other chest pain: Secondary | ICD-10-CM | POA: Insufficient documentation

## 2023-03-11 DIAGNOSIS — M25512 Pain in left shoulder: Secondary | ICD-10-CM | POA: Diagnosis present

## 2023-03-11 DIAGNOSIS — Z79899 Other long term (current) drug therapy: Secondary | ICD-10-CM | POA: Insufficient documentation

## 2023-03-11 DIAGNOSIS — I2510 Atherosclerotic heart disease of native coronary artery without angina pectoris: Secondary | ICD-10-CM | POA: Diagnosis not present

## 2023-03-11 DIAGNOSIS — R600 Localized edema: Secondary | ICD-10-CM | POA: Diagnosis not present

## 2023-03-11 LAB — CBC
HCT: 41.3 % (ref 39.0–52.0)
Hemoglobin: 13.5 g/dL (ref 13.0–17.0)
MCH: 29 pg (ref 26.0–34.0)
MCHC: 32.7 g/dL (ref 30.0–36.0)
MCV: 88.6 fL (ref 80.0–100.0)
Platelets: 209 10*3/uL (ref 150–400)
RBC: 4.66 MIL/uL (ref 4.22–5.81)
RDW: 12.4 % (ref 11.5–15.5)
WBC: 6.8 10*3/uL (ref 4.0–10.5)
nRBC: 0 % (ref 0.0–0.2)

## 2023-03-11 LAB — BASIC METABOLIC PANEL
Anion gap: 13 (ref 5–15)
BUN: 28 mg/dL — ABNORMAL HIGH (ref 8–23)
CO2: 23 mmol/L (ref 22–32)
Calcium: 9.7 mg/dL (ref 8.9–10.3)
Chloride: 103 mmol/L (ref 98–111)
Creatinine, Ser: 1.9 mg/dL — ABNORMAL HIGH (ref 0.61–1.24)
GFR, Estimated: 39 mL/min — ABNORMAL LOW (ref >60)
Glucose, Bld: 100 mg/dL — ABNORMAL HIGH (ref 70–99)
Potassium: 3.9 mmol/L (ref 3.5–5.1)
Sodium: 139 mmol/L (ref 135–145)

## 2023-03-11 LAB — TROPONIN I (HIGH SENSITIVITY): Troponin I (High Sensitivity): 9 ng/L (ref <18)

## 2023-03-11 NOTE — ED Triage Notes (Signed)
Pt in with L shoulder and L arm pain x 1 week. Pt states he also has intermittent cp, hx of Medtronic pacemaker and cardiac stents. +sob and hypertensive

## 2023-03-12 ENCOUNTER — Emergency Department (HOSPITAL_COMMUNITY): Payer: 59

## 2023-03-12 LAB — URINALYSIS, W/ REFLEX TO CULTURE (INFECTION SUSPECTED)
Bacteria, UA: NONE SEEN
Bilirubin Urine: NEGATIVE
Glucose, UA: NEGATIVE mg/dL
Ketones, ur: NEGATIVE mg/dL
Leukocytes,Ua: NEGATIVE
Nitrite: NEGATIVE
Protein, ur: 100 mg/dL — AB
Specific Gravity, Urine: 1.01 (ref 1.005–1.030)
pH: 6 (ref 5.0–8.0)

## 2023-03-12 LAB — HEPATIC FUNCTION PANEL
ALT: 15 U/L (ref 0–44)
AST: 16 U/L (ref 15–41)
Albumin: 3.6 g/dL (ref 3.5–5.0)
Alkaline Phosphatase: 65 U/L (ref 38–126)
Bilirubin, Direct: <0.1 mg/dL (ref 0.0–0.2)
Total Bilirubin: 0.4 mg/dL (ref <1.2)
Total Protein: 6.5 g/dL (ref 6.5–8.1)

## 2023-03-12 LAB — TROPONIN I (HIGH SENSITIVITY): Troponin I (High Sensitivity): 8 ng/L (ref <18)

## 2023-03-12 LAB — LIPASE, BLOOD: Lipase: 48 U/L (ref 11–51)

## 2023-03-12 LAB — BRAIN NATRIURETIC PEPTIDE: B Natriuretic Peptide: 78.1 pg/mL (ref 0.0–100.0)

## 2023-03-12 MED ORDER — LIDOCAINE 5 % EX PTCH
1.0000 | MEDICATED_PATCH | CUTANEOUS | Status: DC
  Administered 2023-03-12: 1 via TRANSDERMAL
  Filled 2023-03-12: qty 1

## 2023-03-12 MED ORDER — IOHEXOL 350 MG/ML SOLN
75.0000 mL | Freq: Once | INTRAVENOUS | Status: AC | PRN
  Administered 2023-03-12: 75 mL via INTRAVENOUS

## 2023-03-12 MED ORDER — GABAPENTIN 100 MG PO CAPS: 100.0000 mg | ORAL_CAPSULE | Freq: Two times a day (BID) | ORAL | 0 refills | Status: DC | PRN

## 2023-03-12 MED ORDER — LIDOCAINE 5 % EX PTCH: 1.0000 | MEDICATED_PATCH | CUTANEOUS | 0 refills | Status: DC

## 2023-03-12 MED ORDER — OXYCODONE HCL 5 MG PO TABS: 2.5000 mg | ORAL_TABLET | Freq: Three times a day (TID) | ORAL | 0 refills | Status: DC | PRN

## 2023-03-12 MED ORDER — FENTANYL CITRATE PF 50 MCG/ML IJ SOSY
50.0000 ug | PREFILLED_SYRINGE | Freq: Once | INTRAMUSCULAR | Status: AC
  Administered 2023-03-12: 50 ug via INTRAVENOUS
  Filled 2023-03-12: qty 1

## 2023-03-12 NOTE — ED Provider Notes (Signed)
EMERGENCY DEPARTMENT AT [facility] Provider Note   CSN: [id number] Arrival date & time: 03/11/23  2243     History  Chief Complaint  Patient presents with   Shoulder Pain   Chest Pain    Peter Marsh is a 62 y.o. male.  The history is provided by the patient and medical records.  Shoulder Pain Chest Pain Peter Marsh is a 62 y.o. male who presents to the Emergency Department complaining of back pain.  He presents to the emergency department for evaluation of severe and sharp left posterior shoulder pain around his scapula that radiates to the left upper extremity.  Pain has been present for 4 days.  It comes and goes.  Pain is worse when he lays flat.  Is not consistently reproducible with range of motion of his arm.  He has a history of coronary artery disease, cholecystectomy, pacemaker.  No reported injuries.  He did have mild pain when he woke on Monday.  He also reports mild chest pain and shortness of breath.  He has nausea but no vomiting.  He also complains of feeling hot when he urinates.  He also reports bilateral lower extremity edema for 1 week.  He also has abdominal discomfort for 2 weeks.  No fevers.       Home Medications Prior to Admission medications   Medication Sig Start Date End Date Taking? Authorizing Provider  gabapentin (NEURONTIN) 100 MG capsule Take 1 capsule (100 mg total) by mouth 2 (two) times daily as needed. 03/12/23  Yes Tilden Fossa, MD  lidocaine (LIDODERM) 5 % Place 1 patch onto the skin daily. Remove & Discard patch within 12 hours or as directed by MD 03/12/23  Yes Tilden Fossa, MD  oxyCODONE (ROXICODONE) 5 MG immediate release tablet Take 0.5 tablets (2.5 mg total) by mouth every 8 (eight) hours as needed for severe pain (pain score 7-10). 03/12/23  Yes Tilden Fossa, MD  albuterol (VENTOLIN HFA) 108 (90 Base) MCG/ACT inhaler Inhale 1-2 puffs into the lungs every 6 (six) hours as needed for wheezing or shortness  of breath.    [provider]  amLODipine (NORVASC) 5 MG tablet Take 1 tablet (5 mg total) by mouth daily. 11/28/22   O'NealRonnald Ramp, MD  aspirin 81 MG chewable tablet Chew 1 tablet (81 mg total) by mouth daily. 05/28/22   Osvaldo Shipper, MD  Budeson-Glycopyrrol-Formoterol (BREZTRI AEROSPHERE) 160-9-4.8 MCG/ACT AERO Inhale 2 puffs into the lungs in the morning and at bedtime. 12/09/20   Hetty Blend, FNP  buPROPion (WELLBUTRIN SR) 150 MG 12 hr tablet Take 150 mg by mouth 2 (two) times daily.    [provider]  cloNIDine (CATAPRES) 0.2 MG tablet Take 1 tablet (0.2 mg total) by mouth 3 (three) times daily. 09/19/22   O'NealRonnald Ramp, MD  clopidogrel (PLAVIX) 75 MG tablet Take 1 tablet (75 mg total) by mouth daily. 08/21/22 08/21/23  O'NealRonnald Ramp, MD  ezetimibe (ZETIA) 10 MG tablet Take 1 tablet (10 mg total) by mouth daily. 07/28/22   O'NealRonnald Ramp, MD  hydrALAZINE (APRESOLINE) 100 MG tablet Take 1 tablet (100 mg total) by mouth 3 (three) times daily. 11/28/22   O'NealRonnald Ramp, MD  isosorbide dinitrate (ISORDIL) 30 MG tablet TAKE 1 TABLET(30 MG) BY MOUTH THREE TIMES DAILY 01/05/23   Reather Littler D, NP  metoprolol succinate (TOPROL-XL) 100 MG 24 hr tablet Take 1 tablet (100 mg total) by mouth 2 (two) times  daily. Take with or immediately following a meal. Patient taking differently: Take 100 mg by mouth daily. Take with or immediately following a meal. 05/27/22   Osvaldo Shipper, MD  montelukast (SINGULAIR) 10 MG tablet Take 1 tablet (10 mg total) by mouth at bedtime. 12/09/20 05/27/23  Hetty Blend, FNP  nitroGLYCERIN (NITROSTAT) 0.4 MG SL tablet Place 0.4 mg under the tongue every 5 (five) minutes as needed for chest pain.    [provider]  ondansetron (ZOFRAN) 4 MG tablet Take 1 tablet (4 mg total) by mouth every 8 (eight) hours as needed for nausea or vomiting. 11/16/22   Lonell Grandchild, MD  paliperidone (INVEGA) 6 MG 24 hr tablet Take 6 mg by  mouth at bedtime.    [provider]  pantoprazole (PROTONIX) 40 MG tablet TAKE 1 TABLET(40 MG) BY MOUTH TWICE DAILY 07/06/21   Tressia Danas, MD  Spacer/Aero-Holding [facility] Use as directed with inhaler. 07/12/21   Nehemiah Settle, FNP  sucralfate (CARAFATE) 1 GM/10ML suspension Take 10 mLs (1 g total) by mouth 4 (four) times daily -  with meals and at bedtime. Patient taking differently: Take 1 g by mouth daily as needed (for ulcers). 10/25/20   Leroy Sea, MD  Vitamin D, Ergocalciferol, (DRISDOL) 1.25 MG (50000 UNIT) CAPS capsule Take 50,000 Units by mouth every 7 (seven) days. 10/12/22 03/23/23  [provider]      Allergies    Methylpyrrolidone, Niacin, Nsaids, Ace inhibitors, Norvasc [amlodipine besylate], Oxybutynin chloride [oxybutynin chloride er], Statins, Ciprofloxacin, Oxybutynin, and Vesicare [solifenacin succinate]    Review of Systems   Review of Systems  Cardiovascular:  Positive for chest pain.  All other systems reviewed and are negative.   Physical Exam Updated Vital Signs BP (!) 161/90   Pulse 64   Temp 98.3 F (36.8 C) (Oral)   Resp 15   Ht 6\' 1"  (1.854 m)   Wt 89.4 kg   SpO2 100%   BMI 26.00 kg/m  Physical Exam Vitals and nursing note reviewed.  Constitutional:      Appearance: He is well-developed.  HENT:     Head: Normocephalic and atraumatic.  Cardiovascular:     Rate and Rhythm: Normal rate and regular rhythm.     Heart sounds: No murmur heard. Pulmonary:     Effort: Pulmonary effort is normal. No respiratory distress.     Breath sounds: Normal breath sounds.  Chest:     Chest wall: No tenderness.  Abdominal:     Palpations: Abdomen is soft.     Tenderness: There is no guarding or rebound.     Comments: Mild generalized abdominal tenderness  Musculoskeletal:        General: No tenderness.     Comments: 2+ DP pulses.  Pitting edema to bilateral lower extremities.  2+ radial pulses bilaterally.  He is able to fully  range the left upper extremity.  5 out of 5 grip strength in bilateral upper extremities.  Skin:    General: Skin is warm and dry.  Neurological:     Mental Status: He is alert and oriented to person, place, and time.  Psychiatric:        Behavior: Behavior normal.     ED Results / Procedures / Treatments   Labs (all labs ordered are listed, but only abnormal results are displayed) Labs Reviewed  BASIC METABOLIC PANEL - Abnormal; Notable for the following components:      Result Value   Glucose, Bld  100 (*)    BUN 28 (*)    Creatinine, Ser 1.90 (*)    GFR, Estimated 39 (*)    All other components within normal limits  URINALYSIS, W/ REFLEX TO CULTURE (INFECTION SUSPECTED) - Abnormal; Notable for the following components:   Color, Urine STRAW (*)    Hgb urine dipstick SMALL (*)    Protein, ur 100 (*)    All other components within normal limits  CBC  HEPATIC FUNCTION PANEL  LIPASE, BLOOD  BRAIN NATRIURETIC PEPTIDE  TROPONIN I (HIGH SENSITIVITY)  TROPONIN I (HIGH SENSITIVITY)    EKG EKG Interpretation Date/Time:  Monday March 12 2023 00:09:44 EST Ventricular Rate:  62 PR Interval:  194 QRS Duration:  175 QT Interval:  444 QTC Calculation: 451 R Axis:   65  Text Interpretation: ATRIAL PACED RHYTHM Confirmed by Tilden Fossa [phone]) on 03/12/2023 12:14:35 AM  Radiology CT Angio Chest/Abd/Pel for Dissection W and/or Wo Contrast  Result Date: 03/12/2023 CLINICAL DATA:  Acute aortic syndrome, progressive back pain, abdominal aortic aneurysm, coronary artery disease. EXAM: CT ANGIOGRAPHY CHEST, ABDOMEN AND PELVIS TECHNIQUE: Non-contrast CT of the chest was initially obtained. Multidetector CT imaging through the chest, abdomen and pelvis was performed using the standard protocol during bolus administration of intravenous contrast. Multiplanar reconstructed images and MIPs were obtained and reviewed to evaluate the vascular anatomy. RADIATION DOSE REDUCTION: This exam was  performed according to the departmental dose-optimization program which includes automated exposure control, adjustment of the mA and/or kV according to patient size and/or use of iterative reconstruction technique. CONTRAST:  75mL OMNIPAQUE IOHEXOL 350 MG/ML SOLN COMPARISON:  CT chest 10/26/2022, CT abdomen pelvis 11/16/2022 FINDINGS: CTA CHEST FINDINGS Cardiovascular: Moderate mixed atherosclerotic plaque within the aortic arch and proximal descending thoracic aorta. No intramural hematoma or dissection involving the thoracic aorta. The proximal descending thoracic aorta is dilated measuring 3.4 cm in greatest dimension, stable since prior examination. The ascending aorta and distal descending aorta are of normal caliber. Mild mixed atherosclerotic plaque within the aortic arch and proximal descending thoracic aorta. Arch vasculature demonstrates conventional anatomic configuration and is widely patent proximally. Type 2 aortic arch. Coronary artery stenting has been performed. Global cardiac size is within normal limits. No pericardial effusion. Central pulmonary arteries are of normal caliber. Left subclavian dual lead pacemaker is in place with leads within the right atrium and within the septal wall at the base, with the ventricular to the possibly perforating the posterior tricuspid valve leaflet. Mediastinum/Nodes: No enlarged mediastinal, hilar, or axillary lymph nodes. Thyroid gland, trachea, and esophagus demonstrate no significant findings. Lungs/Pleura: Mild emphysema. Bronchial wall thickening is present in keeping with airway inflammation. No pneumothorax or pleural effusion. No confluent pulmonary infiltrate. Musculoskeletal: No acute bone abnormality. No lytic or blastic bone lesion. Review of the MIP images confirms the above findings. CTA ABDOMEN AND PELVIS FINDINGS VASCULAR Aorta: Fusiform aneurysm of the infrarenal abdominal aorta measuring 3.9 x 3.8 cm in greatest dimension, stable since prior  examination. Moderate mural thrombus within the aneurysm sac. No periaortic inflammatory change or fluid collections identified. No aortic dissection. Celiac: Greater than 50% stenosis proximally secondary to compression by the median arcuate ligament. Distally widely patent without aneurysm or dissection. SMA: Patent without evidence of aneurysm, dissection, vasculitis or significant stenosis. Renals: Both renal arteries are patent without evidence of aneurysm, dissection, vasculitis, fibromuscular dysplasia or significant stenosis. IMA: Occluded at the origin with reconstitution via the marginal artery. Inflow: Patent without evidence of aneurysm, dissection, vasculitis or significant  stenosis. Veins: No obvious venous abnormality within the limitations of this arterial phase study. Review of the MIP images confirms the above findings. NON-VASCULAR Hepatobiliary: No focal liver abnormality is seen. Status post cholecystectomy. No biliary dilatation. Pancreas: Calcifications are seen scattered throughout the pancreas in keeping with changes of chronic pancreatitis. No superimposed acute peripancreatic inflammatory changes. The pancreatic duct is not dilated. No peripancreatic fluid collections. Spleen: Normal in size without focal abnormality. Adrenals/Urinary Tract: The adrenal glands are unremarkable. The kidneys are normal in position. Moderate to severe right and mild left renal cortical atrophy. Nonobstructing renal calculi are identified within the kidneys bilaterally measuring up to 10 mm within the interpolar region of the right kidney. There is marked right hydronephrosis with distension of the right renal pelvis and decompression of the right ureter in keeping with a high-grade UPJ obstruction. Simple cortical cyst noted within the upper pole the left kidney for which no follow-up imaging is recommended. Additional smaller simple cortical cyst noted within the upper pole the left kidney. The left kidney  is otherwise unremarkable. The bladder is unremarkable. Stomach/Bowel: Stomach is within normal limits. Appendix is not clearly identified and may be absent. No evidence of bowel wall thickening, distention, or inflammatory changes. Stable wispy mesenteric infiltration, possibly the sequela of remote inflammation or treated disease. Lymphatic: Are no pathologic adenopathy within the abdomen and pelvis. Reproductive: Prostate is unremarkable. Other: No abdominal wall hernia or abnormality. No abdominopelvic ascites. Musculoskeletal: Degenerative changes are seen within the lumbar spine. No acute bone abnormality. Review of the MIP images confirms the above findings. IMPRESSION: 1. No acute intrathoracic or intra-abdominal pathology identified. No thoracic aortic dissection or intramural hematoma. 2. Stable 3.4 cm dilatation of the proximal descending thoracic aorta. Recommend annual imaging followup by CTA or MRA. This recommendation follows 2010 ACCF/AHA/AATS/ACR/ASA/SCA/SCAI/SIR/STS/SVM Guidelines for the Diagnosis and Management of Patients with Thoracic Aortic Disease. Circulation.2010; 121: N829-F621. Aortic aneurysm NOS (ICD10-I71.9) 3. Stable 3.9 cm fusiform aneurysm of the infrarenal abdominal aorta. Recommend follow-up ultrasound every 3 years. (Ref.: J Vasc Surg. 2018; 67:2-77 and J Am Coll Radiol 2013;10(10):789-794.) 4. 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 5. 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 6. Moderate to severe right and mild left renal cortical atrophy. Marked right hydronephrosis with distension of the right renal pelvis and decompression of the right ureter in keeping with a high-grade UPJ obstruction. 7. Bilateral nonobstructing nephrolithiasis. 8. Changes of chronic pancreatitis. No superimposed acute peripancreatic inflammatory changes. Aortic Atherosclerosis (ICD10-I70.0) and  Emphysema (ICD10-J43.9). Electronically Signed   By: Helyn Numbers M.D.   On: 03/12/2023 02:37   DG Chest 2 View  Result Date: 03/11/2023 CLINICAL DATA:  Chest pain EXAM: CHEST - 2 VIEW COMPARISON:  06/01/2022 FINDINGS: Left pacer remains in place, unchanged. Heart and mediastinal contours are within normal limits. No focal opacities or effusions. No acute bony abnormality. IMPRESSION: No active cardiopulmonary disease. Electronically Signed   By: Charlett Nose M.D.   On: 03/11/2023 23:18    Procedures Procedures    Medications Ordered in ED Medications  lidocaine (LIDODERM) 5 % 1 patch (1 patch Transdermal Patch Applied 03/12/23 0355)  fentaNYL (SUBLIMAZE) injection 50 mcg (50 mcg Intravenous Given 03/12/23 0151)  iohexol (OMNIPAQUE) 350 MG/ML injection 75 mL (75 mLs Intravenous Contrast Given 03/12/23 0213)    ED Course/ Medical Decision Making/ A&P  Medical Decision Making Amount and/or Complexity of Data Reviewed Labs: ordered. Radiology: ordered.  Risk Prescription drug management.   Patient here for ration of severe and sharp left posterior chest pain/scapular pain that radiates to his left upper arm.  He is neurologically and vascularly intact on evaluation.  He does appear uncomfortable on initial assessment.  Given his multiple comorbidities a CTA dissection protocol was obtained.  CTA demonstrates stable aneurysm but no evidence of dissection.  Troponins are negative x 2.  EKG with paced rhythm he does have renal insufficiency, stable compared to priors.  CTA does not have a clear source of his symptoms although does note multiple incidental findings.  Discussed these findings with the patient such as aneurysm, IMA stenosis, hydronephrosis.  He is already established with vascular surgery, cardiology and urology.  UA is not consistent with UTI.  These findings appear to be chronic in origin and not contributing to current symptoms.  His pain is  significantly improved after treatment in the emergency department.  Plan to discharge with supportive measures at home with outpatient follow-up and return precautions.        Final Clinical Impression(s) / ED Diagnoses Final diagnoses:  Acute pain of left shoulder  Stage 3b chronic kidney disease ([name])    Rx / DC Orders ED Discharge Orders          Ordered    gabapentin (NEURONTIN) 100 MG capsule  2 times daily PRN        03/12/23 0401    oxyCODONE (ROXICODONE) 5 MG immediate release tablet  Every 8 hours PRN        03/12/23 0401    lidocaine (LIDODERM) 5 %  Every 24 hours        03/12/23 0402              Tilden Fossa, MD 03/12/23 0522

## 2023-03-12 NOTE — Discharge Instructions (Addendum)
You had a CT scan performed in the emergency department today that showed multiple findings that will need to be followed up closely with your vascular doctor, urologist and family doctor.  Please follow-up closely with your family doctor for recheck and further evaluation of your pain.

## 2023-03-12 NOTE — ED Notes (Signed)
Patient transported to CT 

## 2023-03-22 NOTE — Progress Notes (Signed)
Cardiology Office Note:  .   Date:  03/23/2023  ID:  Peter Marsh, DOB 11/01/1960, MRN [id number] PCP: Tracey Harries, MD  [facility] Providers Cardiologist:  Reatha Harps, MD Electrophysiologist:  Lewayne Bunting, MD { History of Present Illness: .   Peter Marsh is a 62 y.o. male with history of CAD, HLD, CKD IIIb, AV block s/p ppm, HTN, pAF who presents for follow-up.    History of Present Illness   Peter Marsh, a 62 year old male with a history of CAD status post PCI to the LAD in January, CKD stage 3B, high-grade AV block status post pacemaker, AAA with ongoing surveillance, and COPD, presents for follow-up. He recently had gallbladder surgery performed by Dr. Andrey Campanile, which he reports went well. However, he has been experiencing severe arm pain due to a disc in his neck pressing down on a nerve. This was initially misdiagnosed as a pinched nerve in his back during an ER visit. The pain was so severe that it rendered his arm "out of service" for about a month. He is scheduled to have a procedure to address this issue.  In addition to the above, Mr. Haggstrom has been dealing with an aneurysm, which is being monitored by Dr. Edilia Bo. He also mentions a past issue with a ruptured disc in his lower back, which he has been managing without surgical intervention. He has been taking Zetia for his cholesterol, which was last checked in June and was found to be good. He denies any chest pain or trouble breathing and reports feeling well, except for the pain from the pinched nerve.          Problem List CAD -NSTEMI 05/2022 -PCI to pLAD 2. HLD -T chol 124, HDL 26, LDL 70, TG 161 3. Systolic HF -EF 40-45% 05/2022 -EF 55-60% 09/2022 4. CKD IIIb 5. HTN 6. AV block s/p ppm 7. Atrial flutter -no AC due to GI bleed  8. AAA -39 mm 03/2023 9. Penetrating ulcer aortic arch -followed by surveillance  10. COPD    ROS: All other ROS reviewed and negative. Pertinent positives noted in  the HPI.     Studies Reviewed: [name]   EKG Interpretation Date/Time:  Friday March 23 2023 15:43:02 EST Ventricular Rate:  79 PR Interval:  206 QRS Duration:  158 QT Interval:  398 QTC Calculation: 456 R Axis:   53  Text Interpretation: Atrial-paced rhythm Left bundle branch block Confirmed by Lennie Odor [phone]) on 03/23/2023 4:15:56 PM    TTE 10/02/2022  1. Left ventricular ejection fraction, by estimation, is 55 to 60%. Left  ventricular ejection fraction by 3D volume is 57 %. The left ventricle has  normal function. The left ventricle has no regional wall motion  abnormalities. There is mild asymmetric  left ventricular hypertrophy of the basal-septal segment. Left ventricular  diastolic parameters are consistent with Grade I diastolic dysfunction  (impaired relaxation).   2. Right ventricular systolic function is normal. The right ventricular  size is normal.   3. The mitral valve is abnormal. Mild mitral valve regurgitation.   4. The aortic valve is tricuspid. Aortic valve regurgitation is not  visualized.   Physical Exam:   VS:  BP 104/64 (BP Location: Left Arm, Patient Position: Sitting, Cuff Size: Large)   Pulse 84   Ht 6\' 1"  (1.854 m)   Wt 206 lb 12.8 oz (93.8 kg)   SpO2 96%   BMI 27.28 kg/m    Wt Readings from Last  3 Encounters:  03/23/23 206 lb 12.8 oz (93.8 kg)  03/12/23 197 lb 1.5 oz (89.4 kg)  02/12/23 197 lb (89.4 kg)    GEN: Well nourished, well developed in no acute distress NECK: No JVD; No carotid bruits CARDIAC: RRR, no murmurs, rubs, gallops RESPIRATORY:  Clear to auscultation without rales, wheezing or rhonchi  ABDOMEN: Soft, non-tender, non-distended EXTREMITIES:  No edema; No deformity  ASSESSMENT AND PLAN: .   Assessment and Plan    Coronary Artery Disease (CAD) Status post PCI to LAD in January 2024 with recovered ejection fraction. No reported chest pain or breathing difficulties. -Continue Aspirin and Plavix until May 26, 2023,  then remain on Aspirin indefinitely.  Hyperlipidemia Intolerant to statins, but LDL controlled with Zetia (most recent LDL 70). -Continue Zetia.  Chronic Kidney Disease (CKD) Stage 3B, stable. -No changes to current regimen.  Hypertension Well controlled on current regimen. -Continue current regimen:Amlodipine 5mg , Isordil 30mg , Metoprolol succinate 100mg , Hydralazine 100mg  TID, Clonidine 0.2mg  TID.  Complete Heart Block Status post pacemaker implant. -Continue follow-up with EP.  Abdominal Aortic Aneurysm (AAA) 3.9cm on recent scan, under surveillance with vascular surgery. -Continue surveillance.  Follow-up in 6 months.     Follow-up: Return in about 6 months (around 09/20/2023).  Time Spent with Patient: I have spent a total of 25 minutes caring for this patient today face to face, ordering and reviewing labs/tests, reviewing prior records/medical history, examining the patient, establishing an assessment and plan, communicating results/findings to the patient/family, and documenting in the medical record.   Signed, Lenna Gilford. Flora Lipps, MD, [facility]  [facility]  [address], [address] [place], [place] [address] [phone]  6:23 PM

## 2023-03-23 ENCOUNTER — Ambulatory Visit: Payer: 59 | Attending: Cardiovascular Disease | Admitting: Cardiovascular Disease

## 2023-03-23 ENCOUNTER — Encounter: Payer: Self-pay | Admitting: Cardiovascular Disease

## 2023-03-23 VITALS — BP 104/64 | HR 84 | Ht 73.0 in | Wt 206.8 lb

## 2023-03-23 DIAGNOSIS — I10 Essential (primary) hypertension: Secondary | ICD-10-CM | POA: Diagnosis not present

## 2023-03-23 DIAGNOSIS — I441 Atrioventricular block, second degree: Secondary | ICD-10-CM

## 2023-03-23 DIAGNOSIS — I255 Ischemic cardiomyopathy: Secondary | ICD-10-CM | POA: Diagnosis not present

## 2023-03-23 DIAGNOSIS — E782 Mixed hyperlipidemia: Secondary | ICD-10-CM

## 2023-03-23 DIAGNOSIS — I2510 Atherosclerotic heart disease of native coronary artery without angina pectoris: Secondary | ICD-10-CM

## 2023-03-23 DIAGNOSIS — Z950 Presence of cardiac pacemaker: Secondary | ICD-10-CM

## 2023-03-23 DIAGNOSIS — I714 Abdominal aortic aneurysm, without rupture, unspecified: Secondary | ICD-10-CM

## 2023-03-23 NOTE — Patient Instructions (Signed)
Medication Instructions:  - ON May 26, 2023 STOP PLAVIX. Remain on Aspirin 81mg  until next appointment.    *If you need a refill on your cardiac medications before your next appointment, please call your pharmacy*   Lab Work: None    If you have labs (blood work) drawn today and your tests are completely normal, you will receive your results only by: MyChart Message (if you have MyChart) OR A paper copy in the mail If you have any lab test that is abnormal or we need to change your treatment, we will call you to review the results.   Testing/Procedures: None    Follow-Up: At [facility], you and your health needs are our priority.  As part of our continuing mission to provide you with exceptional heart care, we have created designated Provider Care Teams.  These Care Teams include your primary Cardiologist (physician) and Advanced Practice Providers (APPs -  Physician Assistants and Nurse Practitioners) who all work together to provide you with the care you need, when you need it.  We recommend signing up for the patient portal called "MyChart".  Sign up information is provided on this After Visit Summary.  MyChart is used to connect with patients for Virtual Visits (Telemedicine).  Patients are able to view lab/test results, encounter notes, upcoming appointments, etc.  Non-urgent messages can be sent to your provider as well.   To learn more about what you can do with MyChart, go to [email or web address].    Your next appointment:   6 month(s)  The format for your next appointment:   In Person  Provider:   Reatha Harps, MD    Other Instructions

## 2023-03-26 ENCOUNTER — Ambulatory Visit (INDEPENDENT_AMBULATORY_CARE_PROVIDER_SITE_OTHER): Payer: 59

## 2023-03-26 DIAGNOSIS — I441 Atrioventricular block, second degree: Secondary | ICD-10-CM

## 2023-03-26 DIAGNOSIS — I255 Ischemic cardiomyopathy: Secondary | ICD-10-CM

## 2023-03-27 LAB — CUP PACEART REMOTE DEVICE CHECK
Battery Remaining Longevity: 89 mo
Battery Voltage: 2.98 V
Brady Statistic AP VP Percent: 0.31 %
Brady Statistic AP VS Percent: 95.34 %
Brady Statistic AS VP Percent: 0 %
Brady Statistic AS VS Percent: 4.35 %
Brady Statistic RA Percent Paced: 95.67 %
Brady Statistic RV Percent Paced: 0.31 %
Date Time Interrogation Session: 20241117221439
Implantable Lead Connection Status: 753985
Implantable Lead Connection Status: 753985
Implantable Lead Implant Date: 20181022
Implantable Lead Implant Date: 20181022
Implantable Lead Location: 753859
Implantable Lead Location: 753860
Implantable Lead Model: 3830
Implantable Lead Model: 5076
Implantable Pulse Generator Implant Date: 20181022
Lead Channel Impedance Value: 304 Ohm
Lead Channel Impedance Value: 323 Ohm
Lead Channel Impedance Value: 475 Ohm
Lead Channel Impedance Value: 494 Ohm
Lead Channel Pacing Threshold Amplitude: 0.625 V
Lead Channel Pacing Threshold Amplitude: 1 V
Lead Channel Pacing Threshold Pulse Width: 0.4 ms
Lead Channel Pacing Threshold Pulse Width: 0.4 ms
Lead Channel Sensing Intrinsic Amplitude: 3.375 mV
Lead Channel Sensing Intrinsic Amplitude: 3.375 mV
Lead Channel Sensing Intrinsic Amplitude: 8.875 mV
Lead Channel Sensing Intrinsic Amplitude: 8.875 mV
Lead Channel Setting Pacing Amplitude: 1.5 V
Lead Channel Setting Pacing Amplitude: 2.5 V
Lead Channel Setting Pacing Pulse Width: 1 ms
Lead Channel Setting Sensing Sensitivity: 1.2 mV
Zone Setting Status: 755011
Zone Setting Status: 755011

## 2023-04-18 NOTE — Progress Notes (Signed)
Remote pacemaker transmission.   

## 2023-06-16 ENCOUNTER — Emergency Department (HOSPITAL_COMMUNITY)
Admission: EM | Admit: 2023-06-16 | Discharge: 2023-06-16 | Disposition: A | Discharge status: Home / Self Care | Payer: 59 | Attending: Emergency Medicine | Admitting: Emergency Medicine

## 2023-06-16 ENCOUNTER — Emergency Department (HOSPITAL_COMMUNITY): Payer: 59

## 2023-06-16 ENCOUNTER — Encounter (HOSPITAL_COMMUNITY): Payer: Self-pay

## 2023-06-16 ENCOUNTER — Other Ambulatory Visit: Payer: Self-pay

## 2023-06-16 DIAGNOSIS — Z7902 Long term (current) use of antithrombotics/antiplatelets: Secondary | ICD-10-CM | POA: Diagnosis not present

## 2023-06-16 DIAGNOSIS — Z20822 Contact with and (suspected) exposure to covid-19: Secondary | ICD-10-CM | POA: Insufficient documentation

## 2023-06-16 DIAGNOSIS — N179 Acute kidney failure, unspecified: Secondary | ICD-10-CM | POA: Diagnosis not present

## 2023-06-16 DIAGNOSIS — E860 Dehydration: Secondary | ICD-10-CM

## 2023-06-16 DIAGNOSIS — R109 Unspecified abdominal pain: Secondary | ICD-10-CM | POA: Diagnosis not present

## 2023-06-16 DIAGNOSIS — Z7982 Long term (current) use of aspirin: Secondary | ICD-10-CM | POA: Insufficient documentation

## 2023-06-16 DIAGNOSIS — R0602 Shortness of breath: Secondary | ICD-10-CM | POA: Diagnosis present

## 2023-06-16 DIAGNOSIS — R197 Diarrhea, unspecified: Secondary | ICD-10-CM

## 2023-06-16 DIAGNOSIS — J069 Acute upper respiratory infection, unspecified: Secondary | ICD-10-CM | POA: Diagnosis not present

## 2023-06-16 DIAGNOSIS — R110 Nausea: Secondary | ICD-10-CM

## 2023-06-16 LAB — RESP PANEL BY RT-PCR (RSV, FLU A&B, COVID)  RVPGX2
Influenza A by PCR: NEGATIVE
Influenza B by PCR: NEGATIVE
Resp Syncytial Virus by PCR: NEGATIVE
SARS Coronavirus 2 by RT PCR: NEGATIVE

## 2023-06-16 LAB — CBC WITH DIFFERENTIAL/PLATELET
Abs Immature Granulocytes: 0.04 10*3/uL (ref 0.00–0.07)
Basophils Absolute: 0 10*3/uL (ref 0.0–0.1)
Basophils Relative: 1 %
Eosinophils Absolute: 0.2 10*3/uL (ref 0.0–0.5)
Eosinophils Relative: 4 %
HCT: 38.9 % — ABNORMAL LOW (ref 39.0–52.0)
Hemoglobin: 12.9 g/dL — ABNORMAL LOW (ref 13.0–17.0)
Immature Granulocytes: 1 %
Lymphocytes Relative: 19 %
Lymphs Abs: 1.2 10*3/uL (ref 0.7–4.0)
MCH: 28.7 pg (ref 26.0–34.0)
MCHC: 33.2 g/dL (ref 30.0–36.0)
MCV: 86.4 fL (ref 80.0–100.0)
Monocytes Absolute: 0.9 10*3/uL (ref 0.1–1.0)
Monocytes Relative: 15 %
Neutro Abs: 3.7 10*3/uL (ref 1.7–7.7)
Neutrophils Relative %: 60 %
Platelets: 191 10*3/uL (ref 150–400)
RBC: 4.5 MIL/uL (ref 4.22–5.81)
RDW: 12.7 % (ref 11.5–15.5)
WBC: 6 10*3/uL (ref 4.0–10.5)
nRBC: 0 % (ref 0.0–0.2)

## 2023-06-16 LAB — COMPREHENSIVE METABOLIC PANEL
ALT: 15 U/L (ref 0–44)
AST: 25 U/L (ref 15–41)
Albumin: 4.2 g/dL (ref 3.5–5.0)
Alkaline Phosphatase: 65 U/L (ref 38–126)
Anion gap: 12 (ref 5–15)
BUN: 28 mg/dL — ABNORMAL HIGH (ref 8–23)
CO2: 21 mmol/L — ABNORMAL LOW (ref 22–32)
Calcium: 9.4 mg/dL (ref 8.9–10.3)
Chloride: 105 mmol/L (ref 98–111)
Creatinine, Ser: 2.57 mg/dL — ABNORMAL HIGH (ref 0.61–1.24)
GFR, Estimated: 27 mL/min — ABNORMAL LOW (ref >60)
Glucose, Bld: 114 mg/dL — ABNORMAL HIGH (ref 70–99)
Potassium: 3.6 mmol/L (ref 3.5–5.1)
Sodium: 138 mmol/L (ref 135–145)
Total Bilirubin: 1.2 mg/dL (ref 0.0–1.2)
Total Protein: 7.1 g/dL (ref 6.5–8.1)

## 2023-06-16 LAB — URINALYSIS, W/ REFLEX TO CULTURE (INFECTION SUSPECTED)
Bacteria, UA: NONE SEEN
Bilirubin Urine: NEGATIVE
Glucose, UA: NEGATIVE mg/dL
Ketones, ur: NEGATIVE mg/dL
Leukocytes,Ua: NEGATIVE
Nitrite: NEGATIVE
Protein, ur: 30 mg/dL — AB
Specific Gravity, Urine: 1.017 (ref 1.005–1.030)
pH: 5 (ref 5.0–8.0)

## 2023-06-16 LAB — CK: Total CK: 186 U/L (ref 49–397)

## 2023-06-16 LAB — TROPONIN I (HIGH SENSITIVITY): Troponin I (High Sensitivity): 10 ng/L (ref <18)

## 2023-06-16 LAB — LIPASE, BLOOD: Lipase: 24 U/L (ref 11–51)

## 2023-06-16 LAB — BRAIN NATRIURETIC PEPTIDE: B Natriuretic Peptide: 36.3 pg/mL (ref 0.0–100.0)

## 2023-06-16 LAB — LACTIC ACID, PLASMA: Lactic Acid, Venous: 1.1 mmol/L (ref 0.5–1.9)

## 2023-06-16 MED ORDER — FENTANYL CITRATE PF 50 MCG/ML IJ SOSY
50.0000 ug | PREFILLED_SYRINGE | Freq: Once | INTRAMUSCULAR | Status: AC
  Administered 2023-06-16: 50 ug via INTRAVENOUS
  Filled 2023-06-16: qty 1

## 2023-06-16 MED ORDER — SODIUM CHLORIDE 0.9 % IV BOLUS
500.0000 mL | Freq: Once | INTRAVENOUS | Status: AC
  Administered 2023-06-16: 500 mL via INTRAVENOUS

## 2023-06-16 MED ORDER — ONDANSETRON 4 MG PO TBDP: 4.0000 mg | ORAL_TABLET | Freq: Three times a day (TID) | ORAL | 0 refills | Status: AC | PRN

## 2023-06-16 MED ORDER — ONDANSETRON HCL 4 MG/2ML IJ SOLN
4.0000 mg | Freq: Once | INTRAMUSCULAR | Status: AC
Start: 2023-06-16 — End: 2023-06-16
  Administered 2023-06-16: 4 mg via INTRAVENOUS
  Filled 2023-06-16: qty 2

## 2023-06-16 NOTE — ED Triage Notes (Addendum)
 Cough, intermittent vomiting, general unwell feeling. Pt has stage 4 kidney disease and states he is urinating dark brown with bilateral flank pain, decreased PO intake.

## 2023-06-16 NOTE — Discharge Instructions (Signed)
 Your history, exam, workup today seem consistent with a viral illness leading to your symptoms of upper respiratory issues, nausea, and diarrhea.  Your workup was overall reassuring but we did find some elevated kidney function called acute kidney injury related to some dehydration.  After some fluid you were feeling better and as you were able to eat and drink here we felt you are safe for discharge home to maintain hydration and follow-up with your primary doctor.  We had a shared decision-making conversation discussing admission but feel that is reasonable to hold on this and have you follow-up.  Please rest and stay hydrated.  Please use the nausea medicine.  If any symptoms change or worsen acutely, please return to the nearest emergency department.

## 2023-06-16 NOTE — ED Notes (Signed)
 Pt tolerated PO challenge

## 2023-06-16 NOTE — ED Provider Notes (Signed)
 [place] [facility] AT [facility] Provider Note   CSN: [id number] Arrival date & time: 06/16/23  1551     History  Chief Complaint  Patient presents with   Flank Pain    SKYLAR PRIEST is a 63 y.o. male.  The history is provided by the patient, the spouse and medical records. No language interpreter was used.  Flank Pain This is a new problem. The current episode started more than 2 days ago. The problem occurs constantly. The problem has been gradually worsening. Associated symptoms include chest pain, abdominal pain and shortness of breath. Pertinent negatives include no headaches. Nothing aggravates the symptoms. Nothing relieves the symptoms. He has tried nothing for the symptoms. The treatment provided no relief.       Home Medications Prior to Admission medications   Medication Sig Start Date End Date Taking? Authorizing Provider  albuterol  (VENTOLIN  HFA) 108 (90 Base) MCG/ACT inhaler Inhale 1-2 puffs into the lungs every 6 (six) hours as needed for wheezing or shortness of breath.    [provider]  amLODipine  (NORVASC ) 5 MG tablet Take 1 tablet (5 mg total) by mouth daily. 11/28/22   O'NealDarryle Ned, MD  aspirin  81 MG chewable tablet Chew 1 tablet (81 mg total) by mouth daily. 05/28/22   Krishnan, Gokul, MD  Budeson-Glycopyrrol-Formoterol  (BREZTRI  AEROSPHERE) 160-9-4.8 MCG/ACT AERO Inhale 2 puffs into the lungs in the morning and at bedtime. 12/09/20   Cari Arlean HERO, FNP  buPROPion  (WELLBUTRIN  SR) 150 MG 12 hr tablet Take 150 mg by mouth 2 (two) times daily.    [provider]  cloNIDine  (CATAPRES ) 0.2 MG tablet Take 1 tablet (0.2 mg total) by mouth 3 (three) times daily. 09/19/22   O'NealDarryle Ned, MD  clopidogrel  (PLAVIX ) 75 MG tablet Take 1 tablet (75 mg total) by mouth daily. 08/21/22 08/21/23  O'NealDarryle Ned, MD  ezetimibe  (ZETIA ) 10 MG tablet Take 1 tablet (10 mg total) by mouth daily. 07/28/22   O'NealDarryle Ned, MD  gabapentin  (NEURONTIN ) 100 MG capsule Take 1 capsule (100 mg total) by mouth 2 (two) times daily as needed. 03/12/23   Griselda Norris, MD  hydrALAZINE  (APRESOLINE ) 100 MG tablet Take 1 tablet (100 mg total) by mouth 3 (three) times daily. 11/28/22   O'NealDarryle Ned, MD  isosorbide  dinitrate (ISORDIL ) 30 MG tablet TAKE 1 TABLET(30 MG) BY MOUTH THREE TIMES DAILY 01/05/23   West, Katlyn D, NP  lidocaine  (LIDODERM ) 5 % Place 1 patch onto the skin daily. Remove & Discard patch within 12 hours or as directed by MD 03/12/23   Griselda Norris, MD  metoprolol  succinate (TOPROL -XL) 100 MG 24 hr tablet Take 1 tablet (100 mg total) by mouth 2 (two) times daily. Take with or immediately following a meal. Patient taking differently: Take 100 mg by mouth daily. Take with or immediately following a meal. 05/27/22   Verdene Purchase, MD  montelukast  (SINGULAIR ) 10 MG tablet Take 1 tablet (10 mg total) by mouth at bedtime. 12/09/20 05/27/23  Cari Arlean HERO, FNP  nitroGLYCERIN  (NITROSTAT ) 0.4 MG SL tablet Place 0.4 mg under the tongue every 5 (five) minutes as needed for chest pain. Patient not taking: Reported on 03/23/2023    [provider]  ondansetron  (ZOFRAN ) 4 MG tablet Take 1 tablet (4 mg total) by mouth every 8 (eight) hours as needed for nausea or vomiting. 11/16/22   Francesca Elsie LITTIE, MD  oxyCODONE  (ROXICODONE ) 5 MG immediate release tablet Take 0.5  tablets (2.5 mg total) by mouth every 8 (eight) hours as needed for severe pain (pain score 7-10). Patient not taking: Reported on 03/23/2023 03/12/23   Griselda Norris, MD  paliperidone  (INVEGA ) 6 MG 24 hr tablet Take 6 mg by mouth at bedtime.    [provider]  pantoprazole  (PROTONIX ) 40 MG tablet TAKE 1 TABLET(40 MG) BY MOUTH TWICE DAILY 07/06/21   Eda Iha, MD  Spacer/Aero-Holding [facility] Use as directed with inhaler. 07/12/21   Cheryl Reusing, FNP  sucralfate  (CARAFATE ) 1 GM/10ML suspension Take 10 mLs (1 g total) by  mouth 4 (four) times daily -  with meals and at bedtime. Patient taking differently: Take 1 g by mouth daily as needed (for ulcers). 10/25/20   Singh, Prashant K, MD      Allergies    Methylpyrrolidone, Niacin , Nsaids, Ace inhibitors, Norvasc  [amlodipine  besylate], Oxybutynin chloride [oxybutynin chloride er], Statins, Ciprofloxacin, Oxybutynin, and Vesicare [solifenacin succinate]    Review of Systems   Review of Systems  Constitutional:  Positive for chills, fatigue and fever.  HENT:  Positive for congestion.   Eyes:  Negative for visual disturbance.  Respiratory:  Positive for cough, chest tightness and shortness of breath. Negative for wheezing.   Cardiovascular:  Positive for chest pain and leg swelling. Negative for palpitations.  Gastrointestinal:  Positive for abdominal pain, constipation, nausea and vomiting. Negative for diarrhea.  Genitourinary:  Positive for dysuria and flank pain.  Musculoskeletal:  Positive for back pain. Negative for neck pain and neck stiffness.  Skin:  Negative for rash and wound.  Neurological:  Negative for light-headedness and headaches.  Psychiatric/Behavioral:  Negative for agitation.   All other systems reviewed and are negative.   Physical Exam Updated Vital Signs BP (!) 159/116 (BP Location: Right Arm)   Pulse 96   Temp 98.2 F (36.8 C) (Oral)   Resp 18   Ht 6' 1 (1.854 m)   Wt 93 kg   SpO2 100%   BMI 27.05 kg/m  Physical Exam Vitals and nursing note reviewed.  Constitutional:      General: He is not in acute distress.    Appearance: He is well-developed. He is not ill-appearing, toxic-appearing or diaphoretic.  HENT:     Head: Normocephalic and atraumatic.     Nose: No congestion or rhinorrhea.     Mouth/Throat:     Mouth: Mucous membranes are dry.     Pharynx: No oropharyngeal exudate or posterior oropharyngeal erythema.  Eyes:     Extraocular Movements: Extraocular movements intact.     Conjunctiva/sclera: Conjunctivae  normal.  Cardiovascular:     Rate and Rhythm: Normal rate and regular rhythm.     Heart sounds: No murmur heard. Pulmonary:     Effort: Pulmonary effort is normal. No respiratory distress.     Breath sounds: No stridor. Rhonchi present. No wheezing or rales.  Chest:     Chest wall: No tenderness.  Abdominal:     General: Abdomen is flat.     Palpations: Abdomen is soft.     Tenderness: There is abdominal tenderness. There is right CVA tenderness and left CVA tenderness. There is no guarding or rebound.  Musculoskeletal:        General: Tenderness present. No swelling.     Cervical back: Neck supple. No tenderness.     Right lower leg: Edema present.     Left lower leg: Edema present.  Skin:    General: Skin is warm and dry.  Capillary Refill: Capillary refill takes less than 2 seconds.     Findings: No erythema.  Neurological:     General: No focal deficit present.     Mental Status: He is alert.     Sensory: No sensory deficit.     Motor: No weakness.  Psychiatric:        Mood and Affect: Mood normal.     ED Results / Procedures / Treatments   Labs (all labs ordered are listed, but only abnormal results are displayed) Labs Reviewed  CBC WITH DIFFERENTIAL/PLATELET - Abnormal; Notable for the following components:      Result Value   Hemoglobin 12.9 (*)    HCT 38.9 (*)    All other components within normal limits  COMPREHENSIVE METABOLIC PANEL - Abnormal; Notable for the following components:   CO2 21 (*)    Glucose, Bld 114 (*)    BUN 28 (*)    Creatinine, Ser 2.57 (*)    GFR, Estimated 27 (*)    All other components within normal limits  URINALYSIS, W/ REFLEX TO CULTURE (INFECTION SUSPECTED) - Abnormal; Notable for the following components:   Hgb urine dipstick SMALL (*)    Protein, ur 30 (*)    All other components within normal limits  RESP PANEL BY RT-PCR (RSV, FLU A&B, COVID)  [id number]  URINE CULTURE  CULTURE, BLOOD (ROUTINE X 2)  CULTURE, BLOOD (ROUTINE X  2)  LACTIC ACID, PLASMA  LIPASE, BLOOD  BRAIN NATRIURETIC PEPTIDE  CK  LACTIC ACID, PLASMA  LACTIC ACID, PLASMA  TROPONIN I (HIGH SENSITIVITY)  TROPONIN I (HIGH SENSITIVITY)    EKG None  Radiology CT Renal Stone Study Result Date: 06/16/2023 CLINICAL DATA:  Abdominal/flank pain, stone suspected Dysuria, history of stones, history of pyelo and UTI, bilateral flank pain abdominal pain with nausea and vomiting and chills. Concern for stone versus [place] versus UTI versus flu. EXAM: CT ABDOMEN AND PELVIS WITHOUT CONTRAST TECHNIQUE: Multidetector CT imaging of the abdomen and pelvis was performed following the standard protocol without IV contrast. RADIATION DOSE REDUCTION: This exam was performed according to the departmental dose-optimization program which includes automated exposure control, adjustment of the mA and/or kV according to patient size and/or use of iterative reconstruction technique. COMPARISON:  11/16/2022 CT abdomen.  10/21/2020 CT abdomen/pelvis. FINDINGS: Lower chest: No significant pulmonary nodules or acute consolidative airspace disease. Pacemaker leads seen terminating in the right atrium and ventricular septum. Hepatobiliary: Normal liver size. Subcentimeter hypodense right liver lesion is too small to characterize and unchanged. No new liver lesions. Cholecystectomy. No biliary ductal dilatation. Pancreas: Diffuse scattered coarse pancreatic parenchymal calcifications and parenchymal atrophy compatible with chronic calcific pancreatitis. No pancreatic mass or duct dilation. No peripancreatic fluid collections or inflammatory changes. Spleen: Normal size. No mass. Adrenals/Urinary Tract: Normal adrenals. Chronic prominent right pelvocaliectasis with abrupt caliber transition at the right ureteropelvic junction compatible with chronic right UPJ stenosis, unchanged. Nonobstructing 9 mm lower, 3 mm interpolar and 13 mm upper right renal stones. Punctate 1 mm stone layering in the right  renal pelvis. No left hydronephrosis. Nonobstructing 2 mm interpolar and 7 mm lower left renal stones. Simple 6.1 cm exophytic lateral upper left renal cyst with additional scattered subcentimeter hypodense bilateral renal cortical lesions that are too small to characterize and not appreciably changed, for which no follow-up imaging is recommended. Chronic asymmetric severe right renal cortical atrophy. Normal caliber ureters. No ureteral stones. Normal bladder. Stomach/Bowel: Normal non-distended stomach. Normal caliber small bowel with no small  bowel wall thickening. Appendix not discretely visualized. Normal large bowel with no diverticulosis, large bowel wall thickening or pericolonic fat stranding. Vascular/Lymphatic: Atherosclerotic abdominal aorta with 3.8 cm infrarenal abdominal aortic aneurysm. No pathologically enlarged lymph nodes in the abdomen or pelvis. Reproductive: Top normal size prostate. Other: No pneumoperitoneum, ascites or focal fluid collection. Musculoskeletal: No aggressive appearing focal osseous lesions. Moderate thoracolumbar spondylosis. IMPRESSION: 1. No acute abnormality. No obstructing ureteral stones. 2. Chronic right UPJ stenosis with chronic asymmetric severe right renal cortical atrophy. 3. Bilateral nonobstructing nephrolithiasis. 4. Chronic calcific pancreatitis. 5. Infrarenal 3.8 cm abdominal aortic aneurysm. Recommend surveillance ultrasound in 3 years. 6.  Aortic Atherosclerosis (ICD10-I70.0). Electronically Signed   By: Selinda DELENA Blue M.D.   On: 06/16/2023 17:49   DG Chest Portable 1 View Result Date: 06/16/2023 CLINICAL DATA:  Chest pain, shortness of breath, cough EXAM: PORTABLE CHEST 1 VIEW COMPARISON:  03/11/2023 FINDINGS: Left pacer remains in place, unchanged. Heart and mediastinal contours are within normal limits. No focal opacities or effusions. No acute bony abnormality. Aortic atherosclerosis. IMPRESSION: No active cardiopulmonary disease. Electronically Signed    By: Franky Crease M.D.   On: 06/16/2023 17:29    Procedures Procedures    Medications Ordered in ED Medications  sodium chloride  0.9 % bolus 500 mL (0 mLs Intravenous Stopped 06/16/23 1909)  ondansetron  (ZOFRAN ) injection 4 mg (4 mg Intravenous Given 06/16/23 1755)  fentaNYL  (SUBLIMAZE ) injection 50 mcg (50 mcg Intravenous Given 06/16/23 1814)    ED Course/ Medical Decision Making/ A&P                                 Medical Decision Making Amount and/or Complexity of Data Reviewed Labs: ordered. Radiology: ordered.  Risk Prescription drug management.    DEVEREAUX GRAYSON is a 63 y.o. male with a complex past medical history including CKD on a renal transplant list, schizoaffective disorder, hypertension, hyperlipidemia, previous gastroparesis, asthma/COPD, CHF with pacemaker, paroxysmal atrial fibrillation, abdominal aortic aneurysm, previous small bowel obstruction, previous cholecystectomy, and history of kidney stones/pyelonephritis who presents with fevers, chills, malaise, fatigue, chest pain, cough, shortness of breath, nausea, vomiting, myalgias, malaise, abdominal pain, flank pain, dysuria, and darkened urine.  Patient says that his symptoms have been going on for the last few days.  He has had subjective fevers and chills and has had a URI symptoms with congestion and cough.  He is sore all over and is having muscle aches.  He reports he is having significant pain in his bilateral flanks going to his abdomen and darkened urine with dysuria.  He is concerned about his kidneys as he has stage IV kidney disease and is on a transplant list.  He says he is also having lower abdominal discomfort and thinks he may have had a history of diverticulitis as well.  He denies any diarrhea.  He reports nausea and vomiting and feels like he is getting dehydrated.  He does report some edema in his legs and hands and sides and he is worried about his kidneys.  Does not report significant headache or neck  pain at this time but is having the productive cough and chest tightness with shortness of breath.  On exam, lungs had some coarseness.  Chest was nontender.  Abdomen was tender in the lower abdomen and flanks.  Bilateral CVA tenderness.  Bowel sounds were appreciated.  Legs slightly edematous but were nontender.  No rash seen.  Dry  mucous membranes in his mouth.  Clinically I am concerned about viral illness such as influenza given the ongoing burden in the community.  Also concerned about possible UTI or pyelonephritis given his symptoms.  With his history of stones he will get imaging to rule out stones.  Will also make sure he does not have diverticulitis with CT.  Will hold on contrast given his CKD and transplant list.  Will get chest x-ray for this productive cough and get cardiac workup given his history.  Will give him a small amount of fluids as he is slightly edematous in his legs but his mouth is dry.  Patient will get CK evaluation given his muscle soreness diffusely.  Anticipate reassessment after workup to determine disposition.  9:04 PM Workup was continued to return reassuring.  BNP not elevated and he is feeling better after some fluids.  Urinalysis does not show UTI or pyelonephritis.  Metabolic panel shows creatinine is slightly elevated at 2.5 likely due to his diarrhea and decreased oral intake.  CBC did not show leukocytosis and showed similar anemia.  Lactic acid not elevated.  Lipase normal.  Troponin not elevated.  CK normal, no rhabdo.  Viral testing negative for COVID/flu/RSV.  CT scan did not show acute surgical problem and we went over all the findings together.  He will follow-up with his PCP.  We offered to get a second troponin but he did not want to wait for this.  We had a shared decision-making conversation about getting a troponin and given his lack of further chest discomfort and otherwise reassuring workup he does not want to wait for troponin.  He was able to eat  and drink without difficulty.  I suspect viral illness leading to some dehydration.  We discussed admitting for fluids for this relative AKI but patient would rather maintain hydration and call his doctor to get seen this week for recheck of his kidney function.  This was felt to be reasonable.  Patient had no other questions or concerns and was discharged in good condition with understanding of return precautions and follow-up instructions.          Final Clinical Impression(s) / ED Diagnoses Final diagnoses:  Nausea  Diarrhea, unspecified type  Upper respiratory tract infection, unspecified type  Dehydration  AKI (acute kidney injury) ([name])    Rx / DC Orders ED Discharge Orders          Ordered    ondansetron  (ZOFRAN -ODT) 4 MG disintegrating tablet  Every 8 hours PRN        06/16/23 2106           Clinical Impression: 1. Nausea   2. Diarrhea, unspecified type   3. Upper respiratory tract infection, unspecified type   4. Dehydration   5. AKI (acute kidney injury) ([name])     Disposition: Discharge  Condition: Good  I have discussed the results, Dx and Tx plan with the pt(& family if present). He/she/they expressed understanding and agree(s) with the plan. Discharge instructions discussed at great length. Strict return precautions discussed and pt &/or family have verbalized understanding of the instructions. No further questions at time of discharge.    New Prescriptions   ONDANSETRON  (ZOFRAN -ODT) 4 MG DISINTEGRATING TABLET    Take 1 tablet (4 mg total) by mouth every 8 (eight) hours as needed for nausea or vomiting.    Follow Up: Pura Lenis, MD [address] [place] [id number] [phone]     [facility] at [facility] [address] [place]  [address] [phone]       Retaj Hilbun, Lonni PARAS, MD 06/16/23 2108

## 2023-06-18 LAB — URINE CULTURE: Culture: <10000 — AB

## 2023-06-20 ENCOUNTER — Emergency Department (HOSPITAL_COMMUNITY)
Admission: EM | Admit: 2023-06-20 | Discharge: 2023-06-21 | Disposition: A | Discharge status: Other Acute Inpatient Hospital | Payer: 59 | Attending: Student | Admitting: Student

## 2023-06-20 ENCOUNTER — Other Ambulatory Visit: Payer: Self-pay

## 2023-06-20 DIAGNOSIS — F310 Bipolar disorder, current episode hypomanic: Secondary | ICD-10-CM

## 2023-06-20 DIAGNOSIS — J45909 Unspecified asthma, uncomplicated: Secondary | ICD-10-CM | POA: Insufficient documentation

## 2023-06-20 DIAGNOSIS — I509 Heart failure, unspecified: Secondary | ICD-10-CM | POA: Diagnosis not present

## 2023-06-20 DIAGNOSIS — R451 Restlessness and agitation: Secondary | ICD-10-CM | POA: Diagnosis present

## 2023-06-20 DIAGNOSIS — Z950 Presence of cardiac pacemaker: Secondary | ICD-10-CM | POA: Insufficient documentation

## 2023-06-20 DIAGNOSIS — Z7982 Long term (current) use of aspirin: Secondary | ICD-10-CM | POA: Insufficient documentation

## 2023-06-20 DIAGNOSIS — Z7951 Long term (current) use of inhaled steroids: Secondary | ICD-10-CM | POA: Insufficient documentation

## 2023-06-20 DIAGNOSIS — N189 Chronic kidney disease, unspecified: Secondary | ICD-10-CM | POA: Insufficient documentation

## 2023-06-20 DIAGNOSIS — F411 Generalized anxiety disorder: Secondary | ICD-10-CM | POA: Diagnosis not present

## 2023-06-20 DIAGNOSIS — Z91148 Patient's other noncompliance with medication regimen for other reason: Secondary | ICD-10-CM | POA: Diagnosis not present

## 2023-06-20 DIAGNOSIS — F319 Bipolar disorder, unspecified: Secondary | ICD-10-CM | POA: Insufficient documentation

## 2023-06-20 DIAGNOSIS — E119 Type 2 diabetes mellitus without complications: Secondary | ICD-10-CM | POA: Diagnosis not present

## 2023-06-20 DIAGNOSIS — E876 Hypokalemia: Secondary | ICD-10-CM | POA: Diagnosis not present

## 2023-06-20 DIAGNOSIS — Z79899 Other long term (current) drug therapy: Secondary | ICD-10-CM | POA: Diagnosis not present

## 2023-06-20 DIAGNOSIS — J449 Chronic obstructive pulmonary disease, unspecified: Secondary | ICD-10-CM | POA: Diagnosis not present

## 2023-06-20 LAB — COMPREHENSIVE METABOLIC PANEL
ALT: 21 U/L (ref 0–44)
AST: 31 U/L (ref 15–41)
Albumin: 3.9 g/dL (ref 3.5–5.0)
Alkaline Phosphatase: 59 U/L (ref 38–126)
Anion gap: 10 (ref 5–15)
BUN: 19 mg/dL (ref 8–23)
CO2: 19 mmol/L — ABNORMAL LOW (ref 22–32)
Calcium: 8.4 mg/dL — ABNORMAL LOW (ref 8.9–10.3)
Chloride: 111 mmol/L (ref 98–111)
Creatinine, Ser: 1.82 mg/dL — ABNORMAL HIGH (ref 0.61–1.24)
GFR, Estimated: 41 mL/min — ABNORMAL LOW (ref >60)
Glucose, Bld: 119 mg/dL — ABNORMAL HIGH (ref 70–99)
Potassium: 2.8 mmol/L — ABNORMAL LOW (ref 3.5–5.1)
Sodium: 140 mmol/L (ref 135–145)
Total Bilirubin: 0.8 mg/dL (ref 0.0–1.2)
Total Protein: 6.6 g/dL (ref 6.5–8.1)

## 2023-06-20 LAB — RAPID URINE DRUG SCREEN, HOSP PERFORMED
Amphetamines: NOT DETECTED
Barbiturates: NOT DETECTED
Benzodiazepines: NOT DETECTED
Cocaine: NOT DETECTED
Opiates: NOT DETECTED
Tetrahydrocannabinol: POSITIVE — AB

## 2023-06-20 LAB — CBC WITH DIFFERENTIAL/PLATELET
Abs Immature Granulocytes: 0.03 10*3/uL (ref 0.00–0.07)
Basophils Absolute: 0 10*3/uL (ref 0.0–0.1)
Basophils Relative: 0 %
Eosinophils Absolute: 0.3 10*3/uL (ref 0.0–0.5)
Eosinophils Relative: 4 %
HCT: 41.6 % (ref 39.0–52.0)
Hemoglobin: 13.3 g/dL (ref 13.0–17.0)
Immature Granulocytes: 0 %
Lymphocytes Relative: 18 %
Lymphs Abs: 1.3 10*3/uL (ref 0.7–4.0)
MCH: 28.5 pg (ref 26.0–34.0)
MCHC: 32 g/dL (ref 30.0–36.0)
MCV: 89.3 fL (ref 80.0–100.0)
Monocytes Absolute: 0.7 10*3/uL (ref 0.1–1.0)
Monocytes Relative: 10 %
Neutro Abs: 4.6 10*3/uL (ref 1.7–7.7)
Neutrophils Relative %: 68 %
Platelets: 205 10*3/uL (ref 150–400)
RBC: 4.66 MIL/uL (ref 4.22–5.81)
RDW: 12.6 % (ref 11.5–15.5)
WBC: 6.9 10*3/uL (ref 4.0–10.5)
nRBC: 0 % (ref 0.0–0.2)

## 2023-06-20 LAB — TSH: TSH: 0.314 u[IU]/mL — ABNORMAL LOW (ref 0.350–4.500)

## 2023-06-20 LAB — ETHANOL: Alcohol, Ethyl (B): <10 mg/dL (ref <10)

## 2023-06-20 MED ORDER — PALIPERIDONE ER 3 MG PO TB24: 3.0000 mg | ORAL_TABLET | Freq: Every day | ORAL | 2 refills | Status: AC

## 2023-06-20 MED ORDER — AMLODIPINE BESYLATE 5 MG PO TABS
5.0000 mg | ORAL_TABLET | Freq: Every day | ORAL | Status: DC
  Administered 2023-06-20 – 2023-06-21 (×2): 5 mg via ORAL
  Filled 2023-06-20 (×2): qty 1

## 2023-06-20 MED ORDER — HYDRALAZINE HCL 25 MG PO TABS
100.0000 mg | ORAL_TABLET | Freq: Three times a day (TID) | ORAL | Status: DC
  Administered 2023-06-20 – 2023-06-21 (×2): 100 mg via ORAL
  Filled 2023-06-20 (×2): qty 4

## 2023-06-20 MED ORDER — ACETAMINOPHEN 500 MG PO TABS
1000.0000 mg | ORAL_TABLET | Freq: Once | ORAL | Status: AC
  Administered 2023-06-20: 1000 mg via ORAL
  Filled 2023-06-20: qty 2

## 2023-06-20 MED ORDER — PALIPERIDONE ER 6 MG PO TB24: 6.0000 mg | ORAL_TABLET | Freq: Every day | ORAL | Status: DC

## 2023-06-20 MED ORDER — LORAZEPAM 1 MG PO TABS
2.0000 mg | ORAL_TABLET | Freq: Once | ORAL | Status: AC
  Administered 2023-06-20: 2 mg via ORAL
  Filled 2023-06-20: qty 2

## 2023-06-20 MED ORDER — HALOPERIDOL LACTATE 5 MG/ML IJ SOLN: 5.0000 mg | Freq: Four times a day (QID) | INTRAMUSCULAR | Status: DC | PRN

## 2023-06-20 MED ORDER — TAMSULOSIN HCL 0.4 MG PO CAPS
0.4000 mg | ORAL_CAPSULE | Freq: Every day | ORAL | Status: DC
  Administered 2023-06-20 – 2023-06-21 (×2): 0.4 mg via ORAL
  Filled 2023-06-20 (×2): qty 1

## 2023-06-20 MED ORDER — ACETAMINOPHEN 325 MG PO TABS
650.0000 mg | ORAL_TABLET | Freq: Four times a day (QID) | ORAL | Status: DC | PRN
  Administered 2023-06-20 – 2023-06-21 (×2): 650 mg via ORAL
  Filled 2023-06-20 (×2): qty 2

## 2023-06-20 MED ORDER — ISOSORBIDE DINITRATE 20 MG PO TABS
30.0000 mg | ORAL_TABLET | Freq: Three times a day (TID) | ORAL | Status: DC
  Administered 2023-06-20 – 2023-06-21 (×2): 30 mg via ORAL
  Filled 2023-06-20 (×3): qty 1

## 2023-06-20 MED ORDER — LORAZEPAM 1 MG PO TABS
1.0000 mg | ORAL_TABLET | Freq: Once | ORAL | Status: DC
  Filled 2023-06-20: qty 1

## 2023-06-20 MED ORDER — BUPROPION HCL ER (SR) 150 MG PO TB12
150.0000 mg | ORAL_TABLET | Freq: Two times a day (BID) | ORAL | Status: DC
  Administered 2023-06-20 – 2023-06-21 (×2): 150 mg via ORAL
  Filled 2023-06-20 (×2): qty 1

## 2023-06-20 MED ORDER — CLONIDINE HCL 0.1 MG PO TABS: 0.1000 mg | ORAL_TABLET | Freq: Once | ORAL | Status: DC

## 2023-06-20 MED ORDER — POTASSIUM CHLORIDE CRYS ER 20 MEQ PO TBCR
40.0000 meq | EXTENDED_RELEASE_TABLET | Freq: Once | ORAL | Status: AC
  Administered 2023-06-20: 40 meq via ORAL
  Filled 2023-06-20: qty 2

## 2023-06-20 MED ORDER — PALIPERIDONE ER 3 MG PO TB24
3.0000 mg | ORAL_TABLET | Freq: Every day | ORAL | Status: DC
  Administered 2023-06-20 – 2023-06-21 (×2): 3 mg via ORAL
  Filled 2023-06-20 (×2): qty 1

## 2023-06-20 MED ORDER — LORAZEPAM 1 MG PO TABS: 1.0000 mg | ORAL_TABLET | Freq: Four times a day (QID) | ORAL | Status: DC | PRN

## 2023-06-20 MED ORDER — CLONIDINE HCL 0.1 MG PO TABS
0.2000 mg | ORAL_TABLET | Freq: Three times a day (TID) | ORAL | Status: DC
  Administered 2023-06-20 – 2023-06-21 (×2): 0.2 mg via ORAL
  Filled 2023-06-20 (×2): qty 2

## 2023-06-20 MED ORDER — METOPROLOL SUCCINATE ER 50 MG PO TB24
100.0000 mg | ORAL_TABLET | Freq: Every day | ORAL | Status: DC
  Administered 2023-06-20 – 2023-06-21 (×2): 100 mg via ORAL
  Filled 2023-06-20 (×2): qty 2

## 2023-06-20 MED ORDER — GABAPENTIN 100 MG PO CAPS
100.0000 mg | ORAL_CAPSULE | Freq: Two times a day (BID) | ORAL | Status: DC
  Administered 2023-06-20 – 2023-06-21 (×2): 100 mg via ORAL
  Filled 2023-06-20 (×2): qty 1

## 2023-06-20 MED ORDER — LORAZEPAM 2 MG/ML IJ SOLN: 2.0000 mg | Freq: Four times a day (QID) | INTRAMUSCULAR | Status: DC | PRN

## 2023-06-20 MED ORDER — MOMETASONE FURO-FORMOTEROL FUM 200-5 MCG/ACT IN AERO
2.0000 | INHALATION_SPRAY | Freq: Two times a day (BID) | RESPIRATORY_TRACT | Status: DC
  Administered 2023-06-20 – 2023-06-21 (×2): 2 via RESPIRATORY_TRACT
  Filled 2023-06-20: qty 8.8

## 2023-06-20 NOTE — ED Provider Notes (Signed)
[place] EMERGENCY DEPARTMENT AT [facility] Provider Note   CSN: [id number] Arrival date & time: 06/20/23  8413     History  Chief Complaint  Patient presents with   Psychiatric Evaluation    Peter Marsh is a 63 y.o. male.  HPI  Patient is a 63 year old male with a past medical history significant for DM2, CKD, depression, asthma, COPD, paroxysmal A-fib, CHF, pacemaker present  Patient presents to the emergency room today with complaints of medication noncompliance.  It seems that he stopped taking his Invega and Wellbutrin sometime in the past week or 2.  Seems over the past few days he has been more anxious, agitated, has been demonstrating some pressured speech.  He denies any auditory visual hallucinations denies any suicidal homicidal thoughts.  Per EMS he was cooperative but very loud.  I discussed with his wife who states that there is some issues with filling his medications however they are now available and she said she will be able to continue to provide his medication to him.      Home Medications Prior to Admission medications   Medication Sig Start Date End Date Taking? Authorizing Provider  paliperidone (INVEGA) 3 MG 24 hr tablet Take 1 tablet (3 mg total) by mouth daily. 06/20/23  Yes Shanaiya Bene, Rodrigo Ran, PA  SYMBICORT 160-4.5 MCG/ACT inhaler Inhale into the lungs. 06/11/23  Yes [provider]  tamsulosin (FLOMAX) 0.4 MG CAPS capsule Take by mouth. 06/15/23  Yes [provider]  albuterol (VENTOLIN HFA) 108 (90 Base) MCG/ACT inhaler Inhale 1-2 puffs into the lungs every 6 (six) hours as needed for wheezing or shortness of breath.    [provider]  amLODipine (NORVASC) 5 MG tablet Take 1 tablet (5 mg total) by mouth daily. 11/28/22   O'NealRonnald Ramp, MD  aspirin 81 MG chewable tablet Chew 1 tablet (81 mg total) by mouth daily. 05/28/22   Osvaldo Shipper, MD  Budeson-Glycopyrrol-Formoterol (BREZTRI AEROSPHERE) 160-9-4.8  MCG/ACT AERO Inhale 2 puffs into the lungs in the morning and at bedtime. 12/09/20   Hetty Blend, FNP  buPROPion (WELLBUTRIN SR) 150 MG 12 hr tablet Take 150 mg by mouth 2 (two) times daily.    [provider]  cloNIDine (CATAPRES) 0.2 MG tablet Take 1 tablet (0.2 mg total) by mouth 3 (three) times daily. 09/19/22   O'NealRonnald Ramp, MD  clopidogrel (PLAVIX) 75 MG tablet Take 1 tablet (75 mg total) by mouth daily. 08/21/22 08/21/23  O'NealRonnald Ramp, MD  ezetimibe (ZETIA) 10 MG tablet Take 1 tablet (10 mg total) by mouth daily. 07/28/22   O'NealRonnald Ramp, MD  gabapentin (NEURONTIN) 100 MG capsule Take 1 capsule (100 mg total) by mouth 2 (two) times daily as needed. 03/12/23   Tilden Fossa, MD  hydrALAZINE (APRESOLINE) 100 MG tablet Take 1 tablet (100 mg total) by mouth 3 (three) times daily. 11/28/22   O'NealRonnald Ramp, MD  isosorbide dinitrate (ISORDIL) 30 MG tablet TAKE 1 TABLET(30 MG) BY MOUTH THREE TIMES DAILY 01/05/23   Reather Littler D, NP  lidocaine (LIDODERM) 5 % Place 1 patch onto the skin daily. Remove & Discard patch within 12 hours or as directed by MD 03/12/23   Tilden Fossa, MD  metoprolol succinate (TOPROL-XL) 100 MG 24 hr tablet Take 1 tablet (100 mg total) by mouth 2 (two) times daily. Take with or immediately following a meal. Patient taking differently: Take 100 mg by mouth daily. Take with or immediately following  a meal. 05/27/22   Osvaldo Shipper, MD  montelukast (SINGULAIR) 10 MG tablet Take 1 tablet (10 mg total) by mouth at bedtime. 12/09/20 05/27/23  Hetty Blend, FNP  nitroGLYCERIN (NITROSTAT) 0.4 MG SL tablet Place 0.4 mg under the tongue every 5 (five) minutes as needed for chest pain. Patient not taking: Reported on 03/23/2023    [provider]  ondansetron (ZOFRAN) 4 MG tablet Take 1 tablet (4 mg total) by mouth every 8 (eight) hours as needed for nausea or vomiting. 11/16/22   Lonell Grandchild, MD  ondansetron (ZOFRAN-ODT) 4 MG  disintegrating tablet Take 1 tablet (4 mg total) by mouth every 8 (eight) hours as needed for nausea or vomiting. 06/16/23   Tegeler, Canary Brim, MD  oxyCODONE (ROXICODONE) 5 MG immediate release tablet Take 0.5 tablets (2.5 mg total) by mouth every 8 (eight) hours as needed for severe pain (pain score 7-10). Patient not taking: Reported on 03/23/2023 03/12/23   Tilden Fossa, MD  pantoprazole (PROTONIX) 40 MG tablet TAKE 1 TABLET(40 MG) BY MOUTH TWICE DAILY 07/06/21   Tressia Danas, MD  Spacer/Aero-Holding [facility] Use as directed with inhaler. 07/12/21   Nehemiah Settle, FNP  sucralfate (CARAFATE) 1 GM/10ML suspension Take 10 mLs (1 g total) by mouth 4 (four) times daily -  with meals and at bedtime. Patient taking differently: Take 1 g by mouth daily as needed (for ulcers). 10/25/20   Leroy Sea, MD      Allergies    Methylpyrrolidone, Niacin, Nsaids, Ace inhibitors, Norvasc [amlodipine besylate], Oxybutynin chloride [oxybutynin chloride er], Statins, Ciprofloxacin, Oxybutynin, and Vesicare [solifenacin succinate]    Review of Systems   Review of Systems  Physical Exam Updated Vital Signs BP (!) 149/88 (BP Location: Right Arm)   Pulse 63   Temp 97.7 F (36.5 C) (Oral)   Resp 16   SpO2 98%  Physical Exam Vitals and nursing note reviewed.  Constitutional:      General: He is not in acute distress.    Comments: Pleasant 63 year old man. Pressured speech.   HENT:     Head: Normocephalic and atraumatic.     Nose: Nose normal.  Eyes:     General: No scleral icterus. Cardiovascular:     Rate and Rhythm: Normal rate and regular rhythm.     Pulses: Normal pulses.     Heart sounds: Normal heart sounds.  Pulmonary:     Effort: Pulmonary effort is normal. No respiratory distress.     Breath sounds: No wheezing.  Abdominal:     Palpations: Abdomen is soft.     Tenderness: There is no abdominal tenderness.  Musculoskeletal:     Cervical back: Normal range of motion.      Right lower leg: No edema.     Left lower leg: No edema.  Skin:    General: Skin is warm and dry.     Capillary Refill: Capillary refill takes less than 2 seconds.  Neurological:     Mental Status: He is alert. Mental status is at baseline.  Psychiatric:        Mood and Affect: Mood normal.     Comments: Pressured speech. Pleasant.      ED Results / Procedures / Treatments   Labs (all labs ordered are listed, but only abnormal results are displayed) Labs Reviewed  COMPREHENSIVE METABOLIC PANEL - Abnormal; Notable for the following components:      Result Value   Potassium 2.8 (*)  CO2 19 (*)    Glucose, Bld 119 (*)    Creatinine, Ser 1.82 (*)    Calcium 8.4 (*)    GFR, Estimated 41 (*)    All other components within normal limits  RAPID URINE DRUG SCREEN, HOSP PERFORMED - Abnormal; Notable for the following components:   Tetrahydrocannabinol POSITIVE (*)    All other components within normal limits  TSH - Abnormal; Notable for the following components:   TSH 0.314 (*)    All other components within normal limits  ETHANOL  CBC WITH DIFFERENTIAL/PLATELET    EKG None  Radiology No results found.  Procedures Procedures    Medications Ordered in ED Medications  paliperidone (INVEGA) 24 hr tablet 3 mg (3 mg Oral Given 06/20/23 0808)  potassium chloride SA (KLOR-CON M) CR tablet 40 mEq (has no administration in time range)  LORazepam (ATIVAN) tablet 2 mg (2 mg Oral Given 06/20/23 0749)  acetaminophen (TYLENOL) tablet 1,000 mg (1,000 mg Oral Given 06/20/23 0750)    ED Course/ Medical Decision Making/ A&P                                 Medical Decision Making Amount and/or Complexity of Data Reviewed Labs: ordered.  Risk OTC drugs. Prescription drug management.   Patient is a 63 year old male with a past medical history significant for DM2, CKD, depression, asthma, COPD, paroxysmal A-fib, CHF, pacemaker present  Patient presents to the emergency room today  with complaints of medication noncompliance.  It seems that he stopped taking his Invega and Wellbutrin sometime in the past week or 2.  Seems over the past few days he has been more anxious, agitated, has been demonstrating some pressured speech.  He denies any auditory visual hallucinations denies any suicidal homicidal thoughts.  Per EMS he was cooperative but very loud.  I discussed with his wife who states that there is some issues with filling his medications however they are now available and she said she will be able to continue to provide his medication to him.  Physical exam unremarkable.  Patient is cooperative and reasonable  Ethanol undetectable, urine drug screen only positive for THC, CMP with hypokalemia of 2.8 will give repletion here, CBC with no leukocytosis or anemia TSH just barely lower than normal, CMP with creatinine actually improved from baseline at 1.8.  Discussed with wife who will pick up patient at approximately 1:30 PM.  He is sleeping after some p.o. Ativan, Tylenol, paliperidone.  He will receive potassium before discharge.  Will discharge with dietary K+ advice and recommend follow up recheck.   Plan is to discharge home with patient's wife.  Patient's wife does note that landlord has been irritated with amount of noises that [place] has been generating at home.  They are working with psychiatry to titrate his medications.  Patient reaffirms that he is not suicidal homicidal or hallucinating.  He agrees with the plan to discharge home.  Wife also agrees with this plan.   Pt is discussing with psychiatry at this time.  Plan is to follow-up on psychiatry recommendations for medication changes, etc.  Patient's wife stated in lobby. Handoff to B. Henderly.   Final Clinical Impression(s) / ED Diagnoses Final diagnoses:  Bipolar affective disorder, current episode hypomanic ([name])  Hypokalemia    Rx / DC Orders ED Discharge Orders          Ordered    paliperidone  (  INVEGA) 3 MG 24 hr tablet  Daily        06/20/23 1003              Solon Augusta [place], [place] 06/20/23 1519    Glendora Score, MD 06/20/23 2049

## 2023-06-20 NOTE — ED Triage Notes (Signed)
Pt BIBA from home, wife called out stating pt has been having an episode for a few days. Hx bipolar and schizophrenia, off meds for unknown amount of time. Has not slept in a few days. Cooperative with EMS, speaking very loudly on arrival. 20ga LH.  154/91 HR 85 has pacemaker 96% RA CBG 207
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
## 2023-06-20 NOTE — Discharge Instructions (Signed)
Potassium was found to be slightly low today.  I have given you the information for dietary sources of potassium.  Have your potassium rechecked with your primary care doctor in the next 1 to 2 weeks.  Please take your prescribed bipolar medications as prescribed I have represcribed your Invega as 3 mg because of your kidney function.  This is a reasonable dose.  Continue taking all your other prescribed medications.  Follow-up with your psychiatrist.

## 2023-06-20 NOTE — ED Provider Notes (Signed)
Patient is a previous provider.  In summation 63 year old here for medication noncompliance.  Apparently stopped taking his Invega and Wellbutrin over the last few weeks.  He has had more anxiety, agitated and some pressured speech according to family.  He was given medications here in the emergency department  Patient was medically cleared.  Psychiatry went to assess patient apparently he was somnolent after medications in the ED.  Disposition per psychiatry Physical Exam  BP (!) 149/88 (BP Location: Right Arm)   Pulse 63   Temp 97.7 F (36.5 C) (Oral)   Resp 16   SpO2 98%   Physical Exam  Procedures  Procedures  ED Course / MDM      Patient is a previous provider.  In summation 63 year old here for medication noncompliance.  Apparently stopped taking his Invega and Wellbutrin over the last few weeks.  He has had more anxiety, agitated and some pressured speech according to family.  He was given medications here in the emergency department  Patient was medically cleared by previous provider  Psychiatry went to assess patient apparently he was somnolent after medications in the ED.  Disposition per psychiatry  Medical Decision Making Amount and/or Complexity of Data Reviewed External Data Reviewed: labs and notes. Labs: ordered. Decision-making details documented in ED Course.  Risk OTC drugs. Prescription drug management.         Jinger Middlesworth A, PA-C 06/20/23 1544    Linwood Dibbles, MD 06/25/23 [phone]
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
## 2023-06-20 NOTE — ED Provider Notes (Signed)
NP attempted to assess face to face- patient is difficult to arouse, RN reported patient had morning Invega and an Ativan for agitation at 0800am- will follow-up when patient is able to participate in assessment. - Recruitment consultant at bedside

## 2023-06-21 ENCOUNTER — Other Ambulatory Visit: Payer: Self-pay

## 2023-06-21 ENCOUNTER — Inpatient Hospital Stay
Admission: AD | Admit: 2023-06-21 | Discharge: 2023-06-28 | DRG: 885 | Disposition: A | Discharge status: Home / Self Care | Payer: 59 | Source: Intra-hospital | Attending: Psychiatry | Admitting: Psychiatry

## 2023-06-21 ENCOUNTER — Encounter: Payer: Self-pay | Admitting: Psychiatry

## 2023-06-21 DIAGNOSIS — Z79899 Other long term (current) drug therapy: Secondary | ICD-10-CM | POA: Diagnosis not present

## 2023-06-21 DIAGNOSIS — E876 Hypokalemia: Secondary | ICD-10-CM | POA: Diagnosis present

## 2023-06-21 DIAGNOSIS — E782 Mixed hyperlipidemia: Secondary | ICD-10-CM | POA: Diagnosis present

## 2023-06-21 DIAGNOSIS — F3111 Bipolar disorder, current episode manic without psychotic features, mild: Secondary | ICD-10-CM | POA: Diagnosis not present

## 2023-06-21 DIAGNOSIS — Z8616 Personal history of COVID-19: Secondary | ICD-10-CM

## 2023-06-21 DIAGNOSIS — I130 Hypertensive heart and chronic kidney disease with heart failure and stage 1 through stage 4 chronic kidney disease, or unspecified chronic kidney disease: Secondary | ICD-10-CM | POA: Diagnosis present

## 2023-06-21 DIAGNOSIS — Z62810 Personal history of physical and sexual abuse in childhood: Secondary | ICD-10-CM

## 2023-06-21 DIAGNOSIS — Z955 Presence of coronary angioplasty implant and graft: Secondary | ICD-10-CM

## 2023-06-21 DIAGNOSIS — F209 Schizophrenia, unspecified: Secondary | ICD-10-CM | POA: Diagnosis present

## 2023-06-21 DIAGNOSIS — E1122 Type 2 diabetes mellitus with diabetic chronic kidney disease: Secondary | ICD-10-CM | POA: Diagnosis present

## 2023-06-21 DIAGNOSIS — F310 Bipolar disorder, current episode hypomanic: Secondary | ICD-10-CM | POA: Diagnosis not present

## 2023-06-21 DIAGNOSIS — F17210 Nicotine dependence, cigarettes, uncomplicated: Secondary | ICD-10-CM | POA: Diagnosis present

## 2023-06-21 DIAGNOSIS — Z91148 Patient's other noncompliance with medication regimen for other reason: Secondary | ICD-10-CM | POA: Diagnosis not present

## 2023-06-21 DIAGNOSIS — I480 Paroxysmal atrial fibrillation: Secondary | ICD-10-CM | POA: Diagnosis present

## 2023-06-21 DIAGNOSIS — Z820 Family history of epilepsy and other diseases of the nervous system: Secondary | ICD-10-CM

## 2023-06-21 DIAGNOSIS — J4520 Mild intermittent asthma, uncomplicated: Secondary | ICD-10-CM | POA: Diagnosis present

## 2023-06-21 DIAGNOSIS — Z7951 Long term (current) use of inhaled steroids: Secondary | ICD-10-CM | POA: Diagnosis not present

## 2023-06-21 DIAGNOSIS — K3184 Gastroparesis: Secondary | ICD-10-CM | POA: Diagnosis present

## 2023-06-21 DIAGNOSIS — I252 Old myocardial infarction: Secondary | ICD-10-CM

## 2023-06-21 DIAGNOSIS — F419 Anxiety disorder, unspecified: Secondary | ICD-10-CM | POA: Diagnosis present

## 2023-06-21 DIAGNOSIS — F515 Nightmare disorder: Secondary | ICD-10-CM | POA: Diagnosis present

## 2023-06-21 DIAGNOSIS — Z818 Family history of other mental and behavioral disorders: Secondary | ICD-10-CM

## 2023-06-21 DIAGNOSIS — E1143 Type 2 diabetes mellitus with diabetic autonomic (poly)neuropathy: Secondary | ICD-10-CM | POA: Diagnosis present

## 2023-06-21 DIAGNOSIS — F319 Bipolar disorder, unspecified: Secondary | ICD-10-CM | POA: Diagnosis present

## 2023-06-21 DIAGNOSIS — N184 Chronic kidney disease, stage 4 (severe): Secondary | ICD-10-CM | POA: Diagnosis present

## 2023-06-21 DIAGNOSIS — Z7982 Long term (current) use of aspirin: Secondary | ICD-10-CM | POA: Diagnosis not present

## 2023-06-21 DIAGNOSIS — Z716 Tobacco abuse counseling: Secondary | ICD-10-CM

## 2023-06-21 DIAGNOSIS — Z8711 Personal history of peptic ulcer disease: Secondary | ICD-10-CM

## 2023-06-21 DIAGNOSIS — Z9151 Personal history of suicidal behavior: Secondary | ICD-10-CM

## 2023-06-21 DIAGNOSIS — Z7902 Long term (current) use of antithrombotics/antiplatelets: Secondary | ICD-10-CM | POA: Diagnosis not present

## 2023-06-21 DIAGNOSIS — I5022 Chronic systolic (congestive) heart failure: Secondary | ICD-10-CM | POA: Diagnosis present

## 2023-06-21 DIAGNOSIS — I2510 Atherosclerotic heart disease of native coronary artery without angina pectoris: Secondary | ICD-10-CM | POA: Diagnosis present

## 2023-06-21 DIAGNOSIS — J4489 Other specified chronic obstructive pulmonary disease: Secondary | ICD-10-CM | POA: Diagnosis present

## 2023-06-21 DIAGNOSIS — Z950 Presence of cardiac pacemaker: Secondary | ICD-10-CM

## 2023-06-21 DIAGNOSIS — Z8249 Family history of ischemic heart disease and other diseases of the circulatory system: Secondary | ICD-10-CM

## 2023-06-21 DIAGNOSIS — Z9049 Acquired absence of other specified parts of digestive tract: Secondary | ICD-10-CM

## 2023-06-21 DIAGNOSIS — Z87442 Personal history of urinary calculi: Secondary | ICD-10-CM

## 2023-06-21 MED ORDER — ISOSORBIDE DINITRATE 30 MG PO TABS
30.0000 mg | ORAL_TABLET | Freq: Three times a day (TID) | ORAL | Status: DC
  Administered 2023-06-21 – 2023-06-28 (×20): 30 mg via ORAL
  Filled 2023-06-21 (×22): qty 1

## 2023-06-21 MED ORDER — HALOPERIDOL LACTATE 5 MG/ML IJ SOLN: 5.0000 mg | Freq: Three times a day (TID) | INTRAMUSCULAR | Status: DC | PRN

## 2023-06-21 MED ORDER — HYDRALAZINE HCL 50 MG PO TABS
100.0000 mg | ORAL_TABLET | Freq: Three times a day (TID) | ORAL | Status: DC
  Administered 2023-06-21 – 2023-06-28 (×20): 100 mg via ORAL
  Filled 2023-06-21 (×23): qty 2

## 2023-06-21 MED ORDER — PALIPERIDONE ER 3 MG PO TB24
3.0000 mg | ORAL_TABLET | Freq: Every day | ORAL | Status: DC
  Administered 2023-06-22: 3 mg via ORAL
  Filled 2023-06-21: qty 1

## 2023-06-21 MED ORDER — GABAPENTIN 100 MG PO CAPS
100.0000 mg | ORAL_CAPSULE | Freq: Two times a day (BID) | ORAL | Status: DC
  Administered 2023-06-21 – 2023-06-28 (×15): 100 mg via ORAL
  Filled 2023-06-21 (×15): qty 1

## 2023-06-21 MED ORDER — METOPROLOL SUCCINATE ER 25 MG PO TB24
100.0000 mg | ORAL_TABLET | Freq: Every day | ORAL | Status: DC
  Administered 2023-06-22 – 2023-06-28 (×7): 100 mg via ORAL
  Filled 2023-06-21 (×7): qty 4

## 2023-06-21 MED ORDER — BUPROPION HCL ER (SR) 150 MG PO TB12
150.0000 mg | ORAL_TABLET | Freq: Two times a day (BID) | ORAL | Status: DC
  Administered 2023-06-21 – 2023-06-28 (×14): 150 mg via ORAL
  Filled 2023-06-21 (×15): qty 1

## 2023-06-21 MED ORDER — DIPHENHYDRAMINE HCL 50 MG/ML IJ SOLN: 50.0000 mg | Freq: Three times a day (TID) | INTRAMUSCULAR | Status: DC | PRN

## 2023-06-21 MED ORDER — MOMETASONE FURO-FORMOTEROL FUM 200-5 MCG/ACT IN AERO
2.0000 | INHALATION_SPRAY | Freq: Two times a day (BID) | RESPIRATORY_TRACT | Status: DC
  Administered 2023-06-21 – 2023-06-28 (×13): 2 via RESPIRATORY_TRACT
  Filled 2023-06-21: qty 8.8

## 2023-06-21 MED ORDER — ACETAMINOPHEN 325 MG PO TABS
650.0000 mg | ORAL_TABLET | Freq: Four times a day (QID) | ORAL | Status: DC | PRN
  Administered 2023-06-21: 650 mg via ORAL
  Filled 2023-06-21: qty 2

## 2023-06-21 MED ORDER — LORAZEPAM 2 MG/ML IJ SOLN: 2.0000 mg | Freq: Three times a day (TID) | INTRAMUSCULAR | Status: DC | PRN

## 2023-06-21 MED ORDER — TAMSULOSIN HCL 0.4 MG PO CAPS
0.4000 mg | ORAL_CAPSULE | Freq: Every day | ORAL | Status: DC
  Administered 2023-06-22: 0.4 mg via ORAL
  Filled 2023-06-21 (×2): qty 1

## 2023-06-21 MED ORDER — ALUM & MAG HYDROXIDE-SIMETH 200-200-20 MG/5ML PO SUSP
30.0000 mL | ORAL | Status: DC | PRN
  Administered 2023-06-26 – 2023-06-27 (×4): 30 mL via ORAL
  Filled 2023-06-21 (×4): qty 30

## 2023-06-21 MED ORDER — CLONIDINE HCL 0.1 MG PO TABS
0.2000 mg | ORAL_TABLET | Freq: Three times a day (TID) | ORAL | Status: DC
  Administered 2023-06-21 – 2023-06-28 (×20): 0.2 mg via ORAL
  Filled 2023-06-21 (×20): qty 2

## 2023-06-21 NOTE — Plan of Care (Signed)
Problem: Education: Goal: Emotional status will improve Outcome: Not Progressing Goal: Mental status will improve Outcome: Not Progressing Goal: Verbalization of understanding the information provided will improve Outcome: Not Progressing

## 2023-06-21 NOTE — Tx Team (Signed)
Initial Treatment Plan 06/21/2023 5:51 PM [name] BMW:[id number]    PATIENT STRESSORS: Health problems   Marital or family conflict   Medication change or noncompliance     PATIENT STRENGTHS: Communication skills  General fund of knowledge  Motivation for treatment/growth    PATIENT IDENTIFIED PROBLEMS: "I didn't take my meds"  "I'm very angry. I'd like to work on my anger."                   DISCHARGE CRITERIA:  Ability to meet basic life and health needs Medical problems require only outpatient monitoring  PRELIMINARY DISCHARGE PLAN: Outpatient therapy  PATIENT/FAMILY INVOLVEMENT: This treatment plan has been presented to and reviewed with the patient, Peter Marsh.  The patient and family have been given the opportunity to ask questions and make suggestions.  Gardiner Barefoot, RN 06/21/2023, 5:51 PM

## 2023-06-21 NOTE — ED Provider Notes (Addendum)
Emergency Medicine Observation Re-evaluation Note  Peter Marsh is a 63 y.o. male, seen on rounds today.  Pt initially presented to the ED for complaints of Psychiatric Evaluation Currently, the patient is resting comfortably.  Physical Exam  BP 129/88   Pulse 78   Temp 97.8 F (36.6 C) (Oral)   Resp 16   SpO2 100%  Physical Exam General: resting comfortably Lungs: resting comfortably, bilateral chest rise Psych: resting comfortably  ED Course / MDM  EKG:EKG Interpretation Date/Time:  Wednesday June 20 2023 21:34:14 EST Ventricular Rate:  94 PR Interval:  174 QRS Duration:  160 QT Interval:  396 QTC Calculation: 495 R Axis:   56  Text Interpretation: ATRIAL PACED RHYTHM Left bundle branch block Abnormal ECG When compared with ECG of 23-Mar-2023 15:43, No significant change was found Confirmed by Dione Booze ([id number]) on 06/21/2023 3:18:39 AM  I have reviewed the labs performed to date as well as medications administered while in observation.  Recent changes in the last 24 hours include plan for inpt psych placement.  Plan  Current plan is for psych placement. Patient was able to be placed at [facility].  Patient is medically stable for transfer.    Melene Plan, DO 06/21/23 1357

## 2023-06-21 NOTE — ED Notes (Signed)
Called Sheriff to transport patient to [facility]

## 2023-06-21 NOTE — ED Notes (Signed)
Called report to Florentina Addison RN at [facility]

## 2023-06-21 NOTE — Progress Notes (Signed)
   06/21/23 2100  Psych Admission Type (Psych Patients Only)  Admission Status Voluntary  Psychosocial Assessment  Patient Complaints Depression;Sadness  Eye Contact Brief  Facial Expression Sad  Affect Depressed;Sad  Speech Soft;Slow  Interaction Assertive  Motor Activity Unsteady  Appearance/Hygiene In scrubs  Behavior Characteristics Cooperative;Calm  Mood Depressed;Sad  Thought Ship broker  Content Blaming others;Paranoia  Delusions None reported or observed  Perception WDL  Hallucination None reported or observed  Judgment Poor  Confusion None  Danger to Self  Current suicidal ideation? Denies  Agreement Not to Harm Self Yes  Description of Agreement verbal  Danger to Others  Danger to Others None reported or observed

## 2023-06-21 NOTE — Progress Notes (Signed)
Patient ID: Peter Marsh, male   DOB: [birth year], 63 y.o.   MRN: [id number]  Patient IVC'd from [facility] for med noncompliance and aggressive behaviors with his wife and ED staff. Patient reports, "I didn't take my meds" and "I'm very angry. I'd like to work on my anger." Patient tearful during admission and stated that he was experiencing conflict within his marriage. Patient also states that this conflict has led to him being homeless. Denies SI/HI/AVH but endorses being "very sad." Past medical history as reported by patient being stage 4 CKD, MI January 2024, gallbladder removal approximately 3-6 months ago, asthma, COPD, and chronic pancreatitis. Reports last BM being 1 week ago. Skin assessment completed and noted to have multiple bruises bilateral arms and edema bilateral lower extremities. Patient oriented to unit rules and procedures. Safety checks initiated. Patient remains safe at this time.
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
## 2023-06-21 NOTE — Group Note (Signed)
Recreation Therapy Group Note   Group Topic:General Recreation  Group Date: 06/21/2023 Start Time: 1530 End Time: 1615 Facilitators: Rosina Lowenstein, LRT, CTRS Location: Courtyard  Group Description: [employer]. LRT and patients played games of basketball, drew with chalk, and played corn hole while outside in the courtyard while getting fresh air and sunlight. Music was being played in the background. LRT and peers conversed about different games they have played before, what they do in their free time and anything else that is on their minds. LRT encouraged pts to drink water after being outside, sweating and getting their heart rate up.  Goal Area(s) Addressed: Patient will build on frustration tolerance skills. Patients will partake in a competitive play game with peers. Patients will gain knowledge of new leisure interest/hobby.    Affect/Mood: N/A   Participation Level: Did not attend    Clinical Observations/Individualized Feedback: Patient did not attend group.   Plan: Continue to engage patient in RT group sessions 2-3x/week.   Rosina Lowenstein, LRT, CTRS 06/21/2023 5:17 PM

## 2023-06-21 NOTE — ED Notes (Signed)
[facility] called pts wife to inform her that pt will be admitted to [facility].  Jacquelynn Cree, [facility]  06/21/23

## 2023-06-22 DIAGNOSIS — E876 Hypokalemia: Secondary | ICD-10-CM | POA: Diagnosis present

## 2023-06-22 LAB — HEMOGLOBIN A1C
Hgb A1c MFr Bld: 5.4 % (ref 4.8–5.6)
Mean Plasma Glucose: 108.28 mg/dL

## 2023-06-22 LAB — CULTURE, BLOOD (ROUTINE X 2): Culture: NO GROWTH

## 2023-06-22 LAB — LIPID PANEL
Cholesterol: 118 mg/dL (ref 0–200)
HDL: 25 mg/dL — ABNORMAL LOW (ref >40)
LDL Cholesterol: 70 mg/dL (ref 0–99)
Total CHOL/HDL Ratio: 4.7 {ratio}
Triglycerides: 116 mg/dL (ref <150)
VLDL: 23 mg/dL (ref 0–40)

## 2023-06-22 MED ORDER — PALIPERIDONE ER 3 MG PO TB24
6.0000 mg | ORAL_TABLET | Freq: Every day | ORAL | Status: DC
  Administered 2023-06-23 – 2023-06-24 (×2): 6 mg via ORAL
  Filled 2023-06-22 (×2): qty 2

## 2023-06-22 MED ORDER — POTASSIUM CHLORIDE 20 MEQ PO PACK
20.0000 meq | PACK | Freq: Two times a day (BID) | ORAL | Status: DC
  Administered 2023-06-22 – 2023-06-27 (×10): 20 meq via ORAL
  Filled 2023-06-22 (×13): qty 1

## 2023-06-22 NOTE — [facility] Suicide Risk Assessment (Signed)
Suicide Risk Assessment  Admission Assessment    [facility] Admission Suicide Risk Assessment   Nursing information obtained from:    Demographic factors:  Male, Caucasian Current Mental Status:  NA Loss Factors:  Loss of significant relationship, Decline in physical health Historical Factors:  NA Risk Reduction Factors:  NA  Total Time spent with patient: 20 minutes Principal Problem: Bipolar disorder ([name]) Diagnosis:  Principal Problem:   Bipolar disorder ([name])  Subjective Data: 63 year old married Caucasian male with reported history of bipolar disorder, previous history of substance abuse, admitted to the inpatient behavioral health unit for aggressive behaviors at home with his wife, per chart review it appears that when he presented to the emergency department he was having symptoms of mania, including but not limited to pressured speech, irritability, agitation, flight of ideas.  Currently patient presents with tangential thought process, pressured speech, however he is redirectable.  He is currently denying depression, anxiety.  Denies SI/HI and AVH.  Alert and oriented to person place and time.  Insight and judgment fair.  Affect is euthymic.  Continued Clinical Symptoms:  Alcohol Use Disorder Identification Test Final Score (AUDIT): 0 The "Alcohol Use Disorders Identification Test", Guidelines for Use in Primary Care, Second Edition.  World Science writer [facility]). Score between 0-7:  no or low risk or alcohol related problems. Score between 8-15:  moderate risk of alcohol related problems. Score between 16-19:  high risk of alcohol related problems. Score 20 or above:  warrants further diagnostic evaluation for alcohol dependence and treatment.   CLINICAL FACTORS:   Alcohol/Substance Abuse/Dependencies   Musculoskeletal: Strength & Muscle Tone: within normal limits Gait & Station: normal Patient leans: N/A  Psychiatric Specialty Exam:  Presentation  General Appearance:   Appropriate for Environment  Eye Contact: Good  Speech: Clear and Coherent  Speech Volume: Increased  Handedness:No data recorded  Mood and Affect  Mood: Euthymic  Affect: Appropriate   Thought Process  Thought Processes: Linear  Descriptions of Associations:Tangential  Orientation:Full (Time, Place and Person)  Thought Content:Logical  History of Schizophrenia/Schizoaffective disorder:No data recorded Duration of Psychotic Symptoms:No data recorded Hallucinations:Hallucinations: None  Ideas of Reference:None  Suicidal Thoughts:Suicidal Thoughts: No  Homicidal Thoughts:Homicidal Thoughts: No   Sensorium  Memory: Immediate Good; Recent Poor; Remote Good  Judgment: Fair  Insight: Fair   Art therapist  Concentration: Fair  Attention Span: Fair  Recall: Good  Fund of Knowledge: Good  Language: Good   Psychomotor Activity  Psychomotor Activity: Psychomotor Activity: Normal   Assets  Assets: Desire for Improvement   Sleep  Sleep:No data recorded   Physical Exam: Physical Exam - see HPI ROS - see HPI  Blood pressure 128/68, pulse 64, temperature 97.9 F (36.6 C), resp. rate 15, height 6\' 1"  (1.854 m), weight 83.2 kg, SpO2 99%. Body mass index is 24.21 kg/m.   COGNITIVE FEATURES THAT CONTRIBUTE TO RISK:  None    SUICIDE RISK:   Minimal: No identifiable suicidal ideation.  Patients presenting with no risk factors but with morbid ruminations; may be classified as minimal risk based on the severity of the depressive symptoms  PLAN OF CARE:  Crisis stabilization, psychiatric evaluation, therapeutic milieu, group participation, medication management for mood stabilization, GAD, and PTSD, and development of an individualized safety plan.  I certify that inpatient services furnished can reasonably be expected to improve the patient's condition.   Tytiana Coles, PA-C 06/22/2023, 8:58 PM

## 2023-06-22 NOTE — Telephone Encounter (Signed)
Per Dr. Flora Lipps, will plan to discuss at next OV.

## 2023-06-22 NOTE — BH IP Treatment Plan (Signed)
Interdisciplinary Treatment and Diagnostic Plan Update  06/22/2023 Time of Session: 09:13 Peter Marsh MRN: [id number]  Principal Diagnosis: Bipolar disorder [facility])  Secondary Diagnoses: Principal Problem:   Bipolar disorder ([name])   Current Medications:  Current Facility-Administered Medications  Medication Dose Route Frequency Provider Last Rate Last Admin   acetaminophen (TYLENOL) tablet 650 mg  650 mg Oral Q6H PRN Penn, Cranston Neighbor, NP   650 mg at 06/21/23 2319   alum & mag hydroxide-simeth (MAALOX/MYLANTA) 200-200-20 MG/5ML suspension 30 mL  30 mL Oral Q4H PRN Penn, Cranston Neighbor, NP       buPROPion (WELLBUTRIN SR) 12 hr tablet 150 mg  150 mg Oral BID Penn, Cranston Neighbor, NP   150 mg at 06/22/23 4098   cloNIDine (CATAPRES) tablet 0.2 mg  0.2 mg Oral TID Mcneil Sober, NP   0.2 mg at 06/22/23 1191   haloperidol lactate (HALDOL) injection 5 mg  5 mg Intramuscular TID PRN Mcneil Sober, NP       And   diphenhydrAMINE (BENADRYL) injection 50 mg  50 mg Intramuscular TID PRN Mcneil Sober, NP       And   LORazepam (ATIVAN) injection 2 mg  2 mg Intramuscular TID PRN Mcneil Sober, NP       gabapentin (NEURONTIN) capsule 100 mg  100 mg Oral BID Penn, Cicely, NP   100 mg at 06/22/23 4782   hydrALAZINE (APRESOLINE) tablet 100 mg  100 mg Oral TID Mcneil Sober, NP   100 mg at 06/22/23 9562   isosorbide dinitrate (ISORDIL) tablet 30 mg  30 mg Oral TID Mcneil Sober, NP   30 mg at 06/22/23 1308   metoprolol succinate (TOPROL-XL) 24 hr tablet 100 mg  100 mg Oral Daily Penn, Cicely, NP   100 mg at 06/22/23 6578   mometasone-formoterol (DULERA) 200-5 MCG/ACT inhaler 2 puff  2 puff Inhalation BID Penn, Cranston Neighbor, NP   2 puff at 06/22/23 0835   paliperidone (INVEGA) 24 hr tablet 3 mg  3 mg Oral Daily Penn, Cicely, NP   3 mg at 06/22/23 4696   tamsulosin (FLOMAX) capsule 0.4 mg  0.4 mg Oral Daily Penn, Cranston Neighbor, NP   0.4 mg at 06/22/23 2952   Facility-Administered Medications Ordered in Other Encounters  Medication Dose Route  Frequency Provider Last Rate Last Admin   acetaminophen (OFIRMEV) IVPB    PRN Illene Silver, CRNA   1,000 mg at 04/19/11 0910   glycopyrrolate (ROBINUL) injection    PRN Illene Silver, CRNA   0.8 mg at 04/19/11 1037   PTA Medications: Medications Prior to Admission  Medication Sig Dispense Refill Last Dose/Taking   albuterol (VENTOLIN HFA) 108 (90 Base) MCG/ACT inhaler Inhale 1-2 puffs into the lungs every 6 (six) hours as needed for wheezing or shortness of breath.      amLODipine (NORVASC) 5 MG tablet Take 1 tablet (5 mg total) by mouth daily. 90 tablet 3    aspirin 81 MG chewable tablet Chew 1 tablet (81 mg total) by mouth daily. 30 tablet 3    buPROPion (WELLBUTRIN SR) 150 MG 12 hr tablet Take 150 mg by mouth 2 (two) times daily.      cloNIDine (CATAPRES) 0.2 MG tablet Take 1 tablet (0.2 mg total) by mouth 3 (three) times daily. 270 tablet 3    clopidogrel (PLAVIX) 75 MG tablet Take 1 tablet (75 mg total) by mouth daily. (Patient not taking: Reported on 06/21/2023) 90 tablet 3    ezetimibe (ZETIA) 10 MG tablet Take 1  tablet (10 mg total) by mouth daily. 90 tablet 3    gabapentin (NEURONTIN) 300 MG capsule Take 300 mg by mouth 3 (three) times daily.      hydrALAZINE (APRESOLINE) 100 MG tablet Take 1 tablet (100 mg total) by mouth 3 (three) times daily. 270 tablet 3    isosorbide dinitrate (ISORDIL) 30 MG tablet TAKE 1 TABLET(30 MG) BY MOUTH THREE TIMES DAILY 270 tablet 3    metoprolol succinate (TOPROL-XL) 100 MG 24 hr tablet Take 1 tablet (100 mg total) by mouth 2 (two) times daily. Take with or immediately following a meal. (Patient taking differently: Take 100 mg by mouth daily. Take with or immediately following a meal.) 60 tablet 2    nitroGLYCERIN (NITROSTAT) 0.4 MG SL tablet Place 0.4 mg under the tongue every 5 (five) minutes as needed for chest pain.      ondansetron (ZOFRAN-ODT) 4 MG disintegrating tablet Take 1 tablet (4 mg total) by mouth every 8 (eight) hours as needed for nausea  or vomiting. 20 tablet 0    paliperidone (INVEGA) 3 MG 24 hr tablet Take 1 tablet (3 mg total) by mouth daily. 30 tablet 2    paliperidone (INVEGA) 6 MG 24 hr tablet Take 6 mg by mouth daily.      pantoprazole (PROTONIX) 40 MG tablet TAKE 1 TABLET(40 MG) BY MOUTH TWICE DAILY 60 tablet 5    sucralfate (CARAFATE) 1 GM/10ML suspension Take 10 mLs (1 g total) by mouth 4 (four) times daily -  with meals and at bedtime. (Patient taking differently: Take 1 g by mouth daily as needed (for ulcers).) 3600 mL 3    SYMBICORT 160-4.5 MCG/ACT inhaler Inhale 2 puffs into the lungs 2 (two) times daily.      tamsulosin (FLOMAX) 0.4 MG CAPS capsule Take 0.4 mg by mouth daily. (Patient not taking: Reported on 06/21/2023)       Patient Stressors: Health problems   Marital or family conflict   Medication change or noncompliance    Patient Strengths: Careers information officer for treatment/growth   Treatment Modalities: Medication Management, Group therapy, Case management,  1 to 1 session with clinician, Psychoeducation, Recreational therapy.   Physician Treatment Plan for Primary Diagnosis: Bipolar disorder ([name]) Long Term Goal(s):     Short Term Goals:    Medication Management: Evaluate patient's response, side effects, and tolerance of medication regimen.  Therapeutic Interventions: 1 to 1 sessions, Unit Group sessions and Medication administration.  Evaluation of Outcomes: Not Met  Physician Treatment Plan for Secondary Diagnosis: Principal Problem:   Bipolar disorder ([name])  Long Term Goal(s):     Short Term Goals:       Medication Management: Evaluate patient's response, side effects, and tolerance of medication regimen.  Therapeutic Interventions: 1 to 1 sessions, Unit Group sessions and Medication administration.  Evaluation of Outcomes: Not Met   RN Treatment Plan for Primary Diagnosis: Bipolar disorder ([name]) Long Term Goal(s): Knowledge of disease  and therapeutic regimen to maintain health will improve  Short Term Goals: Ability to remain free from injury will improve, Ability to verbalize frustration and anger appropriately will improve, Ability to demonstrate self-control, Ability to participate in decision making will improve, Ability to verbalize feelings will improve, Ability to disclose and discuss suicidal ideas, Ability to identify and develop effective coping behaviors will improve, and Compliance with prescribed medications will improve  Medication Management: RN will administer medications as ordered by provider, will assess and evaluate  patient's response and provide education to patient for prescribed medication. RN will report any adverse and/or side effects to prescribing provider.  Therapeutic Interventions: 1 on 1 counseling sessions, Psychoeducation, Medication administration, Evaluate responses to treatment, Monitor vital signs and CBGs as ordered, Perform/monitor CIWA, COWS, AIMS and Fall Risk screenings as ordered, Perform wound care treatments as ordered.  Evaluation of Outcomes: Not Met   LCSW Treatment Plan for Primary Diagnosis: Bipolar disorder [facility]) Long Term Goal(s): Safe transition to appropriate next level of care at discharge, Engage patient in therapeutic group addressing interpersonal concerns.  Short Term Goals: Engage patient in aftercare planning with referrals and resources, Increase social support, Increase ability to appropriately verbalize feelings, Increase emotional regulation, Facilitate acceptance of mental health diagnosis and concerns, Identify triggers associated with mental health/substance abuse issues, and Increase skills for wellness and recovery  Therapeutic Interventions: Assess for all discharge needs, 1 to 1 time with Social worker, Explore available resources and support systems, Assess for adequacy in community support network, Educate family and significant other(s) on suicide  prevention, Complete Psychosocial Assessment, Interpersonal group therapy.  Evaluation of Outcomes: Not Met   Progress in Treatment: Attending groups: No. Participating in groups: No. Taking medication as prescribed: Yes. Toleration medication: Yes. Family/Significant other contact made: No, will contact:  when given permission. Patient understands diagnosis: Yes. Discussing patient identified problems/goals with staff: Yes. Medical problems stabilized or resolved: Yes. Denies suicidal/homicidal ideation: Yes. Issues/concerns per patient self-inventory: No. Other: none.  New problem(s) identified: No, Describe:  none identified.   New Short Term/Long Term Goal(s): elimination of symptoms of psychosis, medication management for mood stabilization; elimination of SI thoughts; development of comprehensive mental wellness/sobriety plan.  Patient Goals:  "To get better, to do things that are constructive, to further my education."  Discharge Plan or Barriers: CSW will assist with development of an appropriate aftercare/discharge plan.   Reason for Continuation of Hospitalization: Anxiety Depression Medication stabilization Suicidal ideation  Estimated Length of Stay: 1-7 days  Last 3 [place] Suicide Severity Risk Score: Flowsheet Row Admission (Current) from 06/21/2023 in [facility] ED from 06/20/2023 in [facility] Emergency Department at [facility] ED from 06/16/2023 in [facility] Emergency Department at [facility]  C-SSRS RISK CATEGORY No Risk No Risk No Risk       Last [facility] 2/9 Scores:    11/04/2020    8:56 AM 03/22/2020    9:25 AM 12/10/2019    2:32 PM  Depression screen PHQ 2/9  Decreased Interest 0 0 0  Down, Depressed, Hopeless 0 0 0  PHQ - 2 Score 0 0 0    Scribe for Treatment Team: Glenis Smoker, LCSW 06/22/2023 10:15 AM

## 2023-06-22 NOTE — H&P (Signed)
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) -- Encouraged patient to participate in unit milieu and in scheduled group therapies -- Short Term Goals: Ability to identify changes in lifestyle to reduce recurrence of condition will improve, Ability to verbalize feelings will improve, Ability to disclose and discuss suicidal ideas, Ability to demonstrate self-control will improve, Ability to identify and develop effective coping behaviors will improve, Ability to maintain clinical measurements within normal limits will improve, Compliance with prescribed medications will improve, and Ability to identify triggers associated with substance abuse/mental health issues will improve -- Long Term Goals: Improvement in symptoms so as ready for discharge   Physician Treatment Plan for  Secondary Diagnosis: Principal Problem:   Bipolar disorder [facility]) Active Problems:   Hypokalemia  Hospitalist consult placed, attempted to page x2/will need replacement, will continue to reach out  Will start Potassium Chloride 20 mEq BID   Long Term Goal(s): Improvement in symptoms so as ready for discharge  Short Term Goals: Compliance with prescribed medications will improve  I certify that inpatient services furnished can reasonably be expected to improve the patient's condition.    Paulene Floor, [place] 2/14/20259:36 PM

## 2023-06-22 NOTE — Plan of Care (Signed)
Problem: Education: Goal: Knowledge of [facility] Education information/materials will improve Outcome: Progressing Goal: Emotional status will improve Outcome: Progressing Goal: Mental status will improve Outcome: Progressing Goal: Verbalization of understanding the information provided will improve Outcome: Progressing

## 2023-06-22 NOTE — Plan of Care (Signed)
[name]

## 2023-06-22 NOTE — Group Note (Signed)
Recreation Therapy Group Note   Group Topic:Leisure Education  Group Date: 06/22/2023 Start Time: 1000 End Time: 1100 Facilitators: Rosina Lowenstein, LRT, CTRS Location:  Craft Room  Group Description: Leisure. Patients were given the option to choose from singing karaoke, coloring mandalas, using oil pastels, journaling, or playing with play-doh. LRT and pts discussed the meaning of leisure, the importance of participating in leisure during their free time/when they're outside of the hospital, as well as how our leisure interests can also serve as coping skills.    Goal Area(s) Addressed:   Patient will identify a current leisure interest.   Patient will learn the definition of "leisure".  Patient will practice making a positive decision.  Patient will have the opportunity to try a new leisure activity.  Patient will communicate with peers and LRT.     Affect/Mood: Appropriate   Participation Level: Active and Engaged   Participation Quality: Independent   Behavior: Appropriate, Calm, and Cooperative   Speech/Thought Process: Coherent   Insight: Good   Judgement: Good   Modes of Intervention: Education, Exploration, Music, Open Conversation, Rapport Building, and Socialization   Patient Response to Interventions:  Attentive, Engaged, Interested , and Receptive   Education Outcome:  Acknowledges education   Clinical Observations/Individualized Feedback: Peter Marsh was active in their participation of session activities and group discussion. Pt identified "cooking and arts and crafts" as things he does in his free time. Pt chose to color with markers while in group. Pt was seen singing along to the songs being played. Pt interacted well with LRT and peers duration of session.    Plan: Continue to engage patient in RT group sessions 2-3x/week.   Rosina Lowenstein, LRT, CTRS 06/22/2023 11:05 AM

## 2023-06-22 NOTE — Progress Notes (Signed)
   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 Denies  Agreement Not to Harm Self Yes  Description of Agreement verbal  Danger to Others  Danger to Others None reported or observed

## 2023-06-23 NOTE — Progress Notes (Signed)
   06/22/23 2100  Psychosocial Assessment  Patient Complaints Depression  Eye Contact Fair  Facial Expression Anxious  Affect Sad  Speech Soft  Interaction Assertive  Motor Activity Unsteady  Appearance/Hygiene In scrubs  Behavior Characteristics Cooperative;Appropriate to situation;Anxious  Mood Pleasant  Thought Process  Coherency WDL  Content Blaming others  Delusions None reported or observed  Perception WDL  Hallucination None reported or observed  Judgment Poor  Confusion None  Danger to Self  Current suicidal ideation? Denies  Agreement Not to Harm Self Yes  Description of Agreement verbal  Danger to Others  Danger to Others None reported or observed   Patient alert and oriented x 4, affect is flat  but brightens upon approach, affect is congruent, thoughts are organized no bizarre behavior noted. 15 minutes safety checks maintained will continue to monitor.

## 2023-06-23 NOTE — Group Note (Signed)
Date:  06/23/2023 Time:  12:22 AM  Group Topic/Focus:  Recovery Goals:   The focus of this group is to identify appropriate goals for recovery and establish a plan to achieve them.    Participation Level:  Did Not Attend  Participation Quality:    Affect:    Cognitive:    Insight:   Engagement in Group:    Modes of Intervention:    Additional Comments:    Peter Marsh 06/23/2023, 12:22 AM

## 2023-06-23 NOTE — Plan of Care (Signed)

## 2023-06-23 NOTE — Progress Notes (Signed)
   06/23/23 1000  Psych Admission Type (Psych Patients Only)  Admission Status Voluntary  Psychosocial Assessment  Patient Complaints Sleep disturbance (patient reported having nightmares)  Eye Contact Fair;Watchful  Facial Expression Other (Comment) (appropriate)  Affect Appropriate to circumstance  Speech Logical/coherent  Interaction Assertive  Motor Activity Slow  Appearance/Hygiene In scrubs  Behavior Characteristics Cooperative;Appropriate to situation  Mood Pleasant (patient states that overall he is feeling "alright".)  Aggressive Behavior  Effect No apparent injury  Thought Process  Coherency WDL  Content WDL  Delusions None reported or observed  Perception WDL  Hallucination None reported or observed  Judgment WDL  Confusion None  Danger to Self  Current suicidal ideation? Denies  Agreement Not to Harm Self Yes  Description of Agreement Verbal  Danger to Others  Danger to Others None reported or observed

## 2023-06-23 NOTE — Progress Notes (Signed)
Patient refused scheduled Flomax, stating that he is allergic and it makes his legs swell. PA was notified.

## 2023-06-23 NOTE — Plan of Care (Signed)
  Problem: Education: Goal: Mental status will improve 06/23/2023 0710 by Trula Ore, RN Outcome: Progressing 06/23/2023 0709 by Trula Ore, RN Outcome: Progressing   Problem: Education: Goal: Emotional status will improve 06/23/2023 0710 by Trula Ore, RN Outcome: Progressing 06/23/2023 0709 by Trula Ore, RN Outcome: Progressing

## 2023-06-23 NOTE — Progress Notes (Signed)
Patient's BP is WNL at this time. So, this writer will re-check at 1400 and reassess if BP medication should be given.   06/23/23 1225  Vital Signs  Pulse Rate 72  BP 123/77  BP Location Right Arm  BP Method Automatic  Patient Position (if appropriate) Sitting

## 2023-06-23 NOTE — Progress Notes (Signed)
[facility] MD Progress Note  06/23/2023 6:44 PM Peter Marsh  MRN:  [id number]  63 year old married Caucasian male with reported history of bipolar disorder, previous history of substance abuse, past medical history including CKD on a renal transplant list, hypertension, hyperlipidemia, previous gastroparesis, asthma/COPD, CHF with pacemaker, paroxysmal atrial fibrillation, abdominal aortic aneurysm, previous small bowel obstruction, previous cholecystectomy, and history of kidney stones/pyelonephritis  admitted to the inpatient behavioral health unit for aggressive behaviors at home with his wife, per chart review it appears that when he presented to the emergency department he was having symptoms of mania, including but not limited to pressured speech, irritability, agitation, flight of ideas.    Subjective: Patient is seen for reassessment, all vitals reviewed in WNL, nurses note reviewed.  Reports that he is doing good, slept well overnight.  He did get up for breakfast this morning, appetite is good.  He denies any current depression, anxiety, paranoia, SI/HI and AVH.  Reports that he thinks he is doing very well here on the unit, and he thinks it has been very helpful.  He denies any medication side effects, med compliant.  No other concerns at this time.  He does appear to be much improved today, pressured speech, flight of ideas has resolved.  Nurse does report that the patient was complaining of having nightmares last night, did not sleep well.  Did follow up with the patient, he states that he did have a nightmare, however no significant decrease in sleep.  Has reported that he does not take her medication Flomax, this was discontinued today.   Principal Problem: Bipolar disorder ([name]) Diagnosis: Principal Problem:   Bipolar disorder ([name]) Active Problems:   Hypokalemia  Total Time spent with patient: 20 minutes  Past Psychiatric History:  Mental health diagnoses: Patient reports diagnosis  of " schizophrenia and bipolar disorder" in 1989 when he was 63 years old, though he reports that at the time he was doing a lot of illicit substances, experiencing hallucinations at the time. History of suicide attempts: Reports history of a suicide attempt in 1989 when he attempted to overdose on cocaine, was also the time he received mental health diagnoses Inpatient psych hospitalization: Denies any prior inpatient hospitalizations for psychiatric illness Past med trials: Reports history of taking Risperdal which worked well for him, Paxil also worked well   Past Medical History:  Past Medical History:  Diagnosis Date   Allergy    Anemia    as a child   Anxiety    Asthma    uses inhalers    Bilateral carotid bruits    Cardiac conduction disorder 2018   s/p MDT PPM   Cataract    Chest pain 06/24/2018   Chronic kidney disease    bladder interstial cystitis    Chronic kidney disease (CKD), stage IV (severe) ([name])    followed by Dr. Marisue Humble at [facility]   COPD (chronic obstructive pulmonary disease) ([name])    Coronary artery disease    COVID-19 virus infection 10/21/2020   Depression    Diabetes mellitus without complication ([name])    Discord with neighbors, lodgers and landlord 11/26/2021   Encounter for care of pacemaker 02/13/2019   GERD (gastroesophageal reflux disease)    Hematemesis 09/16/2016   History of kidney stones    History of stomach ulcers 2001   Hypertension    LBBB (left bundle branch block)    Lower extremity edema    Mild intermittent asthma without complication  Mixed hyperlipidemia    Mobitz type 2 second degree AV block 04/06/2019   Myocardial infarction [facility])    05-25-2022  stent x1   Neuropathy    Pacemaker: Medtronic Azure XT DR MRI [id number] PPM -  BUNDLE OF HIS pacing  02/26/2017   Scheduled Remote pacemaker check  11/12/2018:  There were 24 Fast AV episodes:  EGMs show SVTs. Episodes lasted < 2 minutes. Health trends do not demonstrate  significant abnormality. Battery longevity is 9.4 - 10.3 years. RA pacing is 47.1 %, RV pacing is 40.4 %.  Clinic check 11/07/17.    Paroxysmal atrial flutter ([name])    PONV (postoperative nausea and vomiting)    Prostatitis    Recurrent upper respiratory infection (URI)    Schizophrenia ([name])    Sinus node dysfunction ([name]) 02/13/2019   Urticaria     Past Surgical History:  Procedure Laterality Date   ADENOIDECTOMY     APPENDECTOMY     CHOLECYSTECTOMY N/A 11/30/2022   Procedure: LAPAROSCOPIC CHOLECYSTECTOMY WITH ICG DYE, LAPAROSCOPIC LYSIS OF ADHESIONS;  Surgeon: [name], MD;  Location: [facility] ORS;  Service: General;  Laterality: N/A;   COLONOSCOPY     CORONARY STENT INTERVENTION N/A 05/26/2022   Procedure: CORONARY STENT INTERVENTION;  Surgeon: Corky Crafts, MD;  Location: [facility] INVASIVE CV LAB;  Service: Cardiovascular;  Laterality: N/A;   CORONARY ULTRASOUND/IVUS N/A 05/26/2022   Procedure: Intravascular Ultrasound/IVUS;  Surgeon: Corky Crafts, MD;  Location: [facility] INVASIVE CV LAB;  Service: Cardiovascular;  Laterality: N/A;   ELECTROPHYSIOLOGY STUDY N/A 02/26/2017   Procedure: ELECTROPHYSIOLOGY STUDY;  Surgeon: Marinus Maw, MD;  Location: [facility] INVASIVE CV LAB;  Service: Cardiovascular;  Laterality: N/A;   ESOPHAGOGASTRODUODENOSCOPY (EGD) WITH PROPOFOL N/A 09/18/2016   Procedure: ESOPHAGOGASTRODUODENOSCOPY (EGD) WITH PROPOFOL;  Surgeon: Kerin Salen, MD;  Location: [facility] ENDOSCOPY;  Service: Gastroenterology;  Laterality: N/A;   LEFT HEART CATH AND CORONARY ANGIOGRAPHY N/A 05/26/2022   Procedure: LEFT HEART CATH AND CORONARY ANGIOGRAPHY;  Surgeon: Corky Crafts, MD;  Location: [facility] INVASIVE CV LAB;  Service: Cardiovascular;  Laterality: N/A;   LUMBAR LAMINECTOMY/DECOMPRESSION MICRODISCECTOMY  04/19/2011   Procedure: LUMBAR LAMINECTOMY/DECOMPRESSION MICRODISCECTOMY;  Surgeon: Jacki Cones;  Location: [facility] ORS;  Service: Orthopedics;  Laterality: Left;  Hemi  LAminectomy/Microdiscectomy Lumbar four  - Lumbar five  on the Left (X-Ray)   PACEMAKER IMPLANT N/A 02/26/2017   Procedure: PACEMAKER IMPLANT;  Surgeon: Marinus Maw, MD;  Location: [facility] INVASIVE CV LAB;  Service: Cardiovascular;  Laterality: N/A;   TONSILLECTOMY     UPPER GASTROINTESTINAL ENDOSCOPY     Family History:  Family History  Problem Relation Age of Onset   High blood pressure Mother    Alzheimer's disease Father    Heart attack Brother    Colon cancer Neg Hx    Esophageal cancer Neg Hx    Rectal cancer Neg Hx    Stomach cancer Neg Hx    Family Psychiatric  History: mother - schizophrenia    Social History:  Social History   Substance and Sexual Activity  Alcohol Use No     Social History   Substance and Sexual Activity  Drug Use No    Social History   Socioeconomic History   Marital status: Married    Spouse name: Not on file   Number of children: 1   Years of education: 12   Highest education level: High school graduate  Occupational History   Occupation: Retired  Tobacco Use   Smoking status:  Former    Current packs/day: 0.00    Average packs/day: 1 pack/day for 30.0 years (30.0 ttl pk-yrs)    Types: Cigarettes    Start date: 05/08/1978    Quit date: 05/08/2008    Years since quitting: 15.1   Smokeless tobacco: Never  Vaping Use   Vaping status: Never Used  Substance and Sexual Activity   Alcohol use: No   Drug use: No   Sexual activity: Not Currently  Other Topics Concern   Not on file  Social History Narrative   Lives at home with wife.   Right-handed.   One cup caffeine per day.   Social Drivers of Corporate investment banker Strain: Low Risk  (10/06/2022)   Received from [facility], [facility]   Overall Financial Resource Strain (CARDIA)    Difficulty of Paying Living Expenses: Not very hard  Food Insecurity: No Food Insecurity (06/21/2023)   Hunger Vital Sign    Worried About Running Out of Food in the Last Year: Never true     Ran Out of Food in the Last Year: Never true  Transportation Needs: No Transportation Needs (06/21/2023)   PRAPARE - Administrator, Civil Service (Medical): No    Lack of Transportation (Non-Medical): No  Physical Activity: Insufficiently Active (10/06/2022)   Received from [facility], [facility]   Exercise Vital Sign    Days of Exercise per Week: 2 days    Minutes of Exercise per Session: 10 min  Stress: No Stress Concern Present (10/06/2022)   Received from [facility], [facility] of Occupational Health - Occupational Stress Questionnaire    Feeling of Stress : Not at all  Social Connections: Moderately Integrated (10/06/2022)   Received from [facility], [facility]   Social Network    How would you rate your social network (family, work, friends)?: Adequate participation with social networks   Additional Social History:    History of alcohol / drug use?: Yes (cocaine, crack) Negative Consequences of Use: Legal (B/E, felony charge) Name of Substance 1: Cocaine/crack 1 - Last Use / Amount: years ago                  Sleep: Fair  Appetite:  Good  Current Medications: Current Facility-Administered Medications  Medication Dose Route Frequency Provider Last Rate Last Admin   acetaminophen (TYLENOL) tablet 650 mg  650 mg Oral Q6H PRN Terrance Usery, PA-C   650 mg at 06/21/23 2319   alum & mag hydroxide-simeth (MAALOX/MYLANTA) 200-200-20 MG/5ML suspension 30 mL  30 mL Oral Q4H PRN Kayhan Boardley, PA-C       buPROPion (WELLBUTRIN SR) 12 hr tablet 150 mg  150 mg Oral BID Taos Tapp, PA-C   150 mg at 06/23/23 1743   cloNIDine (CATAPRES) tablet 0.2 mg  0.2 mg Oral TID Yamile Roedl, PA-C   0.2 mg at 06/23/23 1743   haloperidol lactate (HALDOL) injection 5 mg  5 mg Intramuscular TID PRN Juliana Boling, PA-C       And   diphenhydrAMINE (BENADRYL) injection 50 mg  50 mg Intramuscular TID PRN Dymon Summerhill,  Enes Wegener, PA-C       And   LORazepam (ATIVAN) injection 2 mg  2 mg Intramuscular TID PRN Laterria Lasota, PA-C       gabapentin (NEURONTIN) capsule 100 mg  100 mg Oral BID Daryan Buell, PA-C   100 mg at 06/23/23 1743   hydrALAZINE (APRESOLINE) tablet 100 mg  100 mg Oral TID Jannet Calip, PA-C   100 mg at 06/23/23 1743   isosorbide dinitrate (ISORDIL) tablet 30 mg  30 mg Oral TID Tacoya Altizer, PA-C   30 mg at 06/23/23 1743   metoprolol succinate (TOPROL-XL) 24 hr tablet 100 mg  100 mg Oral Daily Cheyeanne Roadcap, PA-C   100 mg at 06/23/23 1003   mometasone-formoterol (DULERA) 200-5 MCG/ACT inhaler 2 puff  2 puff Inhalation BID Fidela Cieslak, PA-C   2 puff at 06/23/23 1004   paliperidone (INVEGA) 24 hr tablet 6 mg  6 mg Oral Daily Odessa Nishi, PA-C   6 mg at 06/23/23 1003   potassium chloride (KLOR-CON) packet 20 mEq  20 mEq Oral BID Cyra Spader, PA-C   20 mEq at 06/23/23 1743   Facility-Administered Medications Ordered in Other Encounters  Medication Dose Route Frequency Provider Last Rate Last Admin   acetaminophen (OFIRMEV) IVPB    PRN Illene Silver, CRNA   1,000 mg at 04/19/11 0910   glycopyrrolate (ROBINUL) injection    PRN Illene Silver, CRNA   0.8 mg at 04/19/11 1037    Lab Results:  Results for orders placed or performed during the hospital encounter of 06/21/23 (from the past 48 hours)  Hemoglobin A1c     Status: None   Collection Time: 06/22/23  5:36 PM  Result Value Ref Range   Hgb A1c MFr Bld 5.4 4.8 - 5.6 %    Comment: (NOTE) Pre diabetes:          5.7%-6.4%  Diabetes:              >6.4%  Glycemic control for   <7.0% adults with diabetes    Mean Plasma Glucose 108.28 mg/dL    Comment: Performed at [facility], [address] [place], [place] [address]  Lipid panel     Status: Abnormal   Collection Time: 06/22/23  5:36 PM  Result Value Ref Range   Cholesterol 118 0 - 200 mg/dL   Triglycerides 086 <578 mg/dL    HDL 25 (L) >46 mg/dL   Total CHOL/HDL Ratio 4.7 RATIO   VLDL 23 0 - 40 mg/dL   LDL Cholesterol 70 0 - 99 mg/dL    Comment:        Total Cholesterol/HDL:CHD Risk Coronary Heart Disease Risk Table                     Men   Women  1/2 Average Risk   3.4   3.3  Average Risk       5.0   4.4  2 X Average Risk   9.6   7.1  3 X Average Risk  23.4   11.0        Use the calculated Patient Ratio above and the CHD Risk Table to determine the patient's CHD Risk.        ATP III CLASSIFICATION (LDL):  <100     mg/dL   Optimal  962-952  mg/dL   Near or Above                    Optimal  130-159  mg/dL   Borderline  841-324  mg/dL   High  >401     mg/dL   Very High Performed at [facility], [address], [place], [place] [address]     Blood Alcohol level:  Lab Results  Component Value Date   [facility] <10 06/20/2023  ETH <10 10/19/2020    Metabolic Disorder Labs: Lab Results  Component Value Date   HGBA1C 5.4 06/22/2023   MPG 108.28 06/22/2023   MPG 114.02 11/27/2022   No results found for: "PROLACTIN" Lab Results  Component Value Date   CHOL 118 06/22/2023   TRIG 116 06/22/2023   HDL 25 (L) 06/22/2023   CHOLHDL 4.7 06/22/2023   VLDL 23 06/22/2023   LDLCALC 70 06/22/2023   LDLCALC 101 (H) 09/20/2020     Musculoskeletal: Strength & Muscle Tone: within normal limits Gait & Station: normal Patient leans: N/A  Psychiatric Specialty Exam:  Presentation  General Appearance:  Appropriate for Environment  Eye Contact: Good  Speech: Normal Rate  Speech Volume: Normal  Handedness:No data recorded  Mood and Affect  Mood: Euthymic  Affect: Appropriate   Thought Process  Thought Processes: Coherent  Descriptions of Associations:Intact  Orientation:Full (Time, Place and Person)  Thought Content:Logical  History of Schizophrenia/Schizoaffective disorder:No data recorded Duration of Psychotic Symptoms:No data  recorded Hallucinations:Hallucinations: None  Ideas of Reference:None  Suicidal Thoughts:Suicidal Thoughts: No  Homicidal Thoughts:Homicidal Thoughts: No   Sensorium  Memory: Immediate Good; Recent Good; Remote Good  Judgment: Fair  Insight: Fair   Art therapist  Concentration: Good  Attention Span: Good  Recall: Good  Fund of Knowledge: Good  Language: Good   Psychomotor Activity  Psychomotor Activity: Psychomotor Activity: Normal   Assets  Assets: Communication Skills   Sleep  Sleep: Sleep: Good    Physical Exam: Physical Exam Vitals and nursing note reviewed.  HENT:     Head: Normocephalic and atraumatic.  Eyes:     Pupils: Pupils are equal, round, and reactive to light.  Pulmonary:     Effort: Pulmonary effort is normal.  Musculoskeletal:        General: Swelling present. Normal range of motion.     Cervical back: Normal range of motion.  Skin:    General: Skin is warm.  Neurological:     General: No focal deficit present.     Mental Status: He is alert and oriented to person, place, and time.  Psychiatric:        Attention and Perception: Attention and perception normal.        Mood and Affect: Mood and affect normal.        Speech: Speech normal.        Behavior: Behavior normal. Behavior is cooperative.        Thought Content: Thought content normal.        Cognition and Memory: Cognition and memory normal.     Comments: Insight and judgement fair, except for substance use     Review of Systems  All other systems reviewed and are negative.  Blood pressure (!) 146/85, pulse 78, temperature (!) 97.2 F (36.2 C), resp. rate 15, height 6\' 1"  (1.854 m), weight 83.2 kg, SpO2 99%. Body mass index is 24.21 kg/m.   Treatment Plan Summary: Patient appears to be doing well today, pressured speech, tangential thought process, flight of ideas appear to have resolved.  No overt symptoms of psychosis, mania.  He is med compliant,  interacts appropriately with others, has been attending groups.  Will continue to monitor closely for further treatment and stabilization.  -- Continue Invega 6 mg daily for mood stabilization  -- Continue Wellbutrin 150 mg BID for depressive symptoms  -- The risks/benefits/side-effects/alternatives to this medication were discussed in detail with the patient and time was given for questions. The patient consents  to medication trial. -- Metabolic profile and EKG monitoring obtained while on an atypical antipsychotic (BMI: Lipid Panel: unremarkable, HbgA1c: 5.4, QTc: has pacemaker) -- Encouraged patient to participate in unit milieu and in scheduled group therapies -- Short Term Goals: Ability to identify changes in lifestyle to reduce recurrence of condition will improve, Ability to verbalize feelings will improve, Ability to disclose and discuss suicidal ideas, Ability to demonstrate self-control will improve, Ability to identify and develop effective coping behaviors will improve, Ability to maintain clinical measurements within normal limits will improve, Compliance with prescribed medications will improve, and Ability to identify triggers associated with substance abuse/mental health issues will improve -- Long Term Goals: Improvement in symptoms so as ready for discharge     Physician Treatment Plan for Secondary Diagnosis: Principal Problem:   Bipolar disorder [facility]) Active Problems:   Hypokalemia   06/22/2023 - Hospitalist consult placed, attempted to page x2/will need replacement, will continue to reach out  06/23/2023 - pt reporting bilateral lower extremity edema, have paged hospitalist again today, awaiting to hear back  Will start Potassium Chloride 20 mEq BID     Long Term Goal(s): Improvement in symptoms so as ready for discharge   Short Term Goals: Compliance with prescribed medications will improve  Discharge Planning - will need housing placement vs inpatient SUD rehab    Paulene Floor, PA-C 06/23/2023, 6:44 PM

## 2023-06-23 NOTE — Progress Notes (Signed)
PA was notified that patient didn't receive his 0800 scheduled medication until 1000. PA stated to this writer to re-check patient's BP at 1200 and if BP is WNL, to still administer 1200 BP meds.

## 2023-06-24 MED ORDER — PRAZOSIN HCL 1 MG PO CAPS
1.0000 mg | ORAL_CAPSULE | Freq: Every day | ORAL | Status: DC
Start: 2023-06-24 — End: 2023-06-24

## 2023-06-24 MED ORDER — PALIPERIDONE ER 3 MG PO TB24
3.0000 mg | ORAL_TABLET | Freq: Once | ORAL | Status: AC
  Administered 2023-06-25: 3 mg via ORAL
  Filled 2023-06-24: qty 1

## 2023-06-24 MED ORDER — DIVALPROEX SODIUM 250 MG PO DR TAB
250.0000 mg | DELAYED_RELEASE_TABLET | Freq: Two times a day (BID) | ORAL | Status: DC
  Administered 2023-06-24 – 2023-06-28 (×8): 250 mg via ORAL
  Filled 2023-06-24 (×8): qty 1

## 2023-06-24 NOTE — Progress Notes (Signed)
   06/24/23 1100  Psych Admission Type (Psych Patients Only)  Admission Status Voluntary  Psychosocial Assessment  Patient Complaints Sleep disturbance (patient endorsed having nightmares again last night)  Eye Contact Fair;Watchful  Facial Expression Other (Comment) (appropriate)  Affect Appropriate to circumstance  Speech Logical/coherent  Interaction Assertive  Motor Activity Slow  Appearance/Hygiene In scrubs  Behavior Characteristics Cooperative;Appropriate to situation  Mood Pleasant (patient states that he is "pretty good")  Aggressive Behavior  Effect No apparent injury  Thought Process  Coherency WDL  Content WDL  Delusions None reported or observed  Perception WDL  Hallucination None reported or observed  Judgment WDL  Confusion None  Danger to Self  Current suicidal ideation? Denies  Agreement Not to Harm Self Yes  Description of Agreement Verbal  Danger to Others  Danger to Others None reported or observed

## 2023-06-24 NOTE — Progress Notes (Signed)
Patient vitals were: BP 125/80 @ 1237 HR 72; this writer held noon BP meds

## 2023-06-24 NOTE — Progress Notes (Signed)
[facility] MD Progress Note  06/24/2023 1:15 PM Peter Marsh  MRN:  [id number]  63 year old married Caucasian male with reported history of bipolar disorder, previous history of substance abuse, past medical history including CKD on a renal transplant list, hypertension, hyperlipidemia, previous gastroparesis, asthma/COPD, CHF with pacemaker, paroxysmal atrial fibrillation, abdominal aortic aneurysm, previous small bowel obstruction, previous cholecystectomy, and history of kidney stones/pyelonephritis  admitted to the inpatient behavioral health unit for aggressive behaviors at home with his wife, per chart review it appears that when he presented to the emergency department he was having symptoms of mania, including but not limited to pressured speech, irritability, agitation, flight of ideas.    Subjective: Patient is seen for reassessment, all vitals reviewed in WNL, nurses note reviewed.  Reports that he had difficulties sleeping once again last night, kept awakening due to nightmares of being chased. States that he thinks [place] may be causing him to have nightmares. He reports that in the past he discontinued taking Invega because of this same reason. He is agreeable with starting Depakote instead. Otherwise reports that he is doing well, feels as though it has been quite beneficial being here on the unit. He is denying any psychiatric or physical sxs. Appetite is good. Denies SI/HI and AVH. Alert and oriented x4. Affect is somewhat blunted today. Insight and judgement fair.  Pt is med compliant, able to communicate his needs. No other concerns. Bilateral lower extremity swelling resolved.    Principal Problem: Bipolar disorder ([name]) Diagnosis: Principal Problem:   Bipolar disorder ([name]) Active Problems:   Hypokalemia  Total Time spent with patient: 20 minutes  Past Psychiatric History:  Mental health diagnoses: Patient reports diagnosis of " schizophrenia and bipolar disorder" in 1989 when he  was 63 years old, though he reports that at the time he was doing a lot of illicit substances, experiencing hallucinations at the time. History of suicide attempts: Reports history of a suicide attempt in 1989 when he attempted to overdose on cocaine, was also the time he received mental health diagnoses Inpatient psych hospitalization: Denies any prior inpatient hospitalizations for psychiatric illness Past med trials: Reports history of taking Risperdal which worked well for him, Paxil also worked well   Past Medical History:  Past Medical History:  Diagnosis Date   Allergy    Anemia    as a child   Anxiety    Asthma    uses inhalers    Bilateral carotid bruits    Cardiac conduction disorder 2018   s/p MDT PPM   Cataract    Chest pain 06/24/2018   Chronic kidney disease    bladder interstial cystitis    Chronic kidney disease (CKD), stage IV (severe) ([name])    followed by Dr. Marisue Humble at [facility]   COPD (chronic obstructive pulmonary disease) ([name])    Coronary artery disease    COVID-19 virus infection 10/21/2020   Depression    Diabetes mellitus without complication ([name])    Discord with neighbors, lodgers and landlord 11/26/2021   Encounter for care of pacemaker 02/13/2019   GERD (gastroesophageal reflux disease)    Hematemesis 09/16/2016   History of kidney stones    History of stomach ulcers 2001   Hypertension    LBBB (left bundle branch block)    Lower extremity edema    Mild intermittent asthma without complication    Mixed hyperlipidemia    Mobitz type 2 second degree AV block 04/06/2019   Myocardial infarction ([name])  05-25-2022  stent x1   Neuropathy    Pacemaker: Medtronic Azure XT DR MRI [id number] PPM -  BUNDLE OF HIS pacing  02/26/2017   Scheduled Remote pacemaker check  11/12/2018:  There were 24 Fast AV episodes:  EGMs show SVTs. Episodes lasted < 2 minutes. Health trends do not demonstrate significant abnormality. Battery longevity is 9.4 - 10.3  years. RA pacing is 47.1 %, RV pacing is 40.4 %.  Clinic check 11/07/17.    Paroxysmal atrial flutter ([name])    PONV (postoperative nausea and vomiting)    Prostatitis    Recurrent upper respiratory infection (URI)    Schizophrenia ([name])    Sinus node dysfunction ([name]) 02/13/2019   Urticaria     Past Surgical History:  Procedure Laterality Date   ADENOIDECTOMY     APPENDECTOMY     CHOLECYSTECTOMY N/A 11/30/2022   Procedure: LAPAROSCOPIC CHOLECYSTECTOMY WITH ICG DYE, LAPAROSCOPIC LYSIS OF ADHESIONS;  Surgeon: [name], MD;  Location: [facility] ORS;  Service: General;  Laterality: N/A;   COLONOSCOPY     CORONARY STENT INTERVENTION N/A 05/26/2022   Procedure: CORONARY STENT INTERVENTION;  Surgeon: Corky Crafts, MD;  Location: [facility] INVASIVE CV LAB;  Service: Cardiovascular;  Laterality: N/A;   CORONARY ULTRASOUND/IVUS N/A 05/26/2022   Procedure: Intravascular Ultrasound/IVUS;  Surgeon: Corky Crafts, MD;  Location: [facility] INVASIVE CV LAB;  Service: Cardiovascular;  Laterality: N/A;   ELECTROPHYSIOLOGY STUDY N/A 02/26/2017   Procedure: ELECTROPHYSIOLOGY STUDY;  Surgeon: Marinus Maw, MD;  Location: [facility] INVASIVE CV LAB;  Service: Cardiovascular;  Laterality: N/A;   ESOPHAGOGASTRODUODENOSCOPY (EGD) WITH PROPOFOL N/A 09/18/2016   Procedure: ESOPHAGOGASTRODUODENOSCOPY (EGD) WITH PROPOFOL;  Surgeon: Kerin Salen, MD;  Location: [facility] ENDOSCOPY;  Service: Gastroenterology;  Laterality: N/A;   LEFT HEART CATH AND CORONARY ANGIOGRAPHY N/A 05/26/2022   Procedure: LEFT HEART CATH AND CORONARY ANGIOGRAPHY;  Surgeon: Corky Crafts, MD;  Location: [facility] INVASIVE CV LAB;  Service: Cardiovascular;  Laterality: N/A;   LUMBAR LAMINECTOMY/DECOMPRESSION MICRODISCECTOMY  04/19/2011   Procedure: LUMBAR LAMINECTOMY/DECOMPRESSION MICRODISCECTOMY;  Surgeon: Jacki Cones;  Location: [facility] ORS;  Service: Orthopedics;  Laterality: Left;  Hemi LAminectomy/Microdiscectomy Lumbar four  - Lumbar five  on the Left (X-Ray)    PACEMAKER IMPLANT N/A 02/26/2017   Procedure: PACEMAKER IMPLANT;  Surgeon: Marinus Maw, MD;  Location: [facility] INVASIVE CV LAB;  Service: Cardiovascular;  Laterality: N/A;   TONSILLECTOMY     UPPER GASTROINTESTINAL ENDOSCOPY     Family History:  Family History  Problem Relation Age of Onset   High blood pressure Mother    Alzheimer's disease Father    Heart attack Brother    Colon cancer Neg Hx    Esophageal cancer Neg Hx    Rectal cancer Neg Hx    Stomach cancer Neg Hx    Family Psychiatric  History: mother - schizophrenia    Social History:  Social History   Substance and Sexual Activity  Alcohol Use No     Social History   Substance and Sexual Activity  Drug Use No    Social History   Socioeconomic History   Marital status: Married    Spouse name: Not on file   Number of children: 1   Years of education: 12   Highest education level: High school graduate  Occupational History   Occupation: Retired  Tobacco Use   Smoking status: Former    Current packs/day: 0.00    Average packs/day: 1 pack/day for 30.0 years (30.0 ttl pk-yrs)  Types: Cigarettes    Start date: 05/08/1978    Quit date: 05/08/2008    Years since quitting: 15.1   Smokeless tobacco: Never  Vaping Use   Vaping status: Never Used  Substance and Sexual Activity   Alcohol use: No   Drug use: No   Sexual activity: Not Currently  Other Topics Concern   Not on file  Social History Narrative   Lives at home with wife.   Right-handed.   One cup caffeine per day.   Social Drivers of Corporate investment banker Strain: Low Risk  (10/06/2022)   Received from [facility]   Overall Financial Resource Strain (CARDIA)    Difficulty of Paying Living Expenses: Not very hard  Food Insecurity: No Food Insecurity (06/21/2023)   Hunger Vital Sign    Worried About Running Out of Food in the Last Year: Never true    Ran Out of Food in the Last Year: Never true  Transportation Needs: No  Transportation Needs (06/21/2023)   PRAPARE - Administrator, Civil Service (Medical): No    Lack of Transportation (Non-Medical): No  Physical Activity: Insufficiently Active (10/06/2022)   Received from [facility]   Exercise Vital Sign    Days of Exercise per Week: 2 days    Minutes of Exercise per Session: 10 min  Stress: No Stress Concern Present (10/06/2022)   Received from [facility], [facility] Occupational [facility] - Occupational Stress Questionnaire    Feeling of Stress : Not at all  Social Connections: Moderately Integrated (10/06/2022)   Received from [facility], [facility]   Social Network    How would you rate your social network (family, work, friends)?: Adequate participation with social networks   Additional Social History:    History of alcohol / drug use?: Yes (cocaine, crack) Negative Consequences of Use: Legal (B/E, felony charge) Name of Substance 1: Cocaine/crack 1 - Last Use / Amount: years ago                  Sleep: Fair  Appetite:  Good  Current Medications: Current Facility-Administered Medications  Medication Dose Route Frequency Provider Last Rate Last Admin   acetaminophen (TYLENOL) tablet 650 mg  650 mg Oral Q6H PRN Tradarius Reinwald, PA-C   650 mg at 06/21/23 2319   alum & mag hydroxide-simeth (MAALOX/MYLANTA) 200-200-20 MG/5ML suspension 30 mL  30 mL Oral Q4H PRN Darril Patriarca, PA-C       buPROPion (WELLBUTRIN SR) 12 hr tablet 150 mg  150 mg Oral BID Aaliah Jorgenson, PA-C   150 mg at 06/24/23 1610   cloNIDine (CATAPRES) tablet 0.2 mg  0.2 mg Oral TID Hamdan Toscano, PA-C   0.2 mg at 06/24/23 9604   haloperidol lactate (HALDOL) injection 5 mg  5 mg Intramuscular TID PRN Tenley Winward, PA-C       And   diphenhydrAMINE (BENADRYL) injection 50 mg  50 mg Intramuscular TID PRN Adelyn Roscher, PA-C       And   LORazepam (ATIVAN) injection 2 mg  2 mg  Intramuscular TID PRN Linna Thebeau, PA-C       gabapentin (NEURONTIN) capsule 100 mg  100 mg Oral BID Najir Roop, PA-C   100 mg at 06/24/23 0855   hydrALAZINE (APRESOLINE) tablet 100 mg  100 mg Oral TID Cheikh Bramble, PA-C   100 mg at 06/24/23 0855   isosorbide dinitrate (ISORDIL) tablet 30 mg  30 mg Oral TID Freja Faro, PA-C   30 mg at 06/24/23 7829   metoprolol succinate (TOPROL-XL) 24 hr tablet 100 mg  100 mg Oral Daily Analeese Andreatta, PA-C   100 mg at 06/24/23 0855   mometasone-formoterol (DULERA) 200-5 MCG/ACT inhaler 2 puff  2 puff Inhalation BID Shams Fill, PA-C   2 puff at 06/24/23 0856   paliperidone (INVEGA) 24 hr tablet 6 mg  6 mg Oral Daily Nicholaus Steinke, PA-C   6 mg at 06/24/23 0855   potassium chloride (KLOR-CON) packet 20 mEq  20 mEq Oral BID Ciaran Begay, PA-C   20 mEq at 06/24/23 5621   prazosin (MINIPRESS) capsule 1 mg  1 mg Oral QHS Juliahna Wiswell, PA-C       Facility-Administered Medications Ordered in Other Encounters  Medication Dose Route Frequency Provider Last Rate Last Admin   acetaminophen (OFIRMEV) IVPB    PRN Illene Silver, CRNA   1,000 mg at 04/19/11 0910   glycopyrrolate (ROBINUL) injection    PRN Illene Silver, CRNA   0.8 mg at 04/19/11 1037    Lab Results:  Results for orders placed or performed during the hospital encounter of 06/21/23 (from the past 48 hours)  Hemoglobin A1c     Status: None   Collection Time: 06/22/23  5:36 PM  Result Value Ref Range   Hgb A1c MFr Bld 5.4 4.8 - 5.6 %    Comment: (NOTE) Pre diabetes:          5.7%-6.4%  Diabetes:              >6.4%  Glycemic control for   <7.0% adults with diabetes    Mean Plasma Glucose 108.28 mg/dL    Comment: Performed at [facility], [address], [place], [place] [address]  Lipid panel     Status: Abnormal   Collection Time: 06/22/23  5:36 PM  Result Value Ref Range   Cholesterol 118 0 - 200 mg/dL   Triglycerides 784  <696 mg/dL   HDL 25 (L) >29 mg/dL   Total CHOL/HDL Ratio 4.7 RATIO   VLDL 23 0 - 40 mg/dL   LDL Cholesterol 70 0 - 99 mg/dL    Comment:        Total Cholesterol/HDL:CHD Risk Coronary Heart Disease Risk Table                     Men   Women  1/2 Average Risk   3.4   3.3  Average Risk       5.0   4.4  2 X Average Risk   9.6   7.1  3 X Average Risk  23.4   11.0        Use the calculated Patient Ratio above and the CHD Risk Table to determine the patient's CHD Risk.        ATP III CLASSIFICATION (LDL):  <100     mg/dL   Optimal  528-413  mg/dL   Near or Above                    Optimal  130-159  mg/dL   Borderline  244-010  mg/dL   High  >272     mg/dL   Very High Performed at [facility], [address], [place], [place] [address]     Blood Alcohol level:  Lab Results  Component Value Date   [facility] <10 06/20/2023   ETH <10  10/19/2020    Metabolic Disorder Labs: Lab Results  Component Value Date   HGBA1C 5.4 06/22/2023   MPG 108.28 06/22/2023   MPG 114.02 11/27/2022   No results found for: "PROLACTIN" Lab Results  Component Value Date   CHOL 118 06/22/2023   TRIG 116 06/22/2023   HDL 25 (L) 06/22/2023   CHOLHDL 4.7 06/22/2023   VLDL 23 06/22/2023   LDLCALC 70 06/22/2023   LDLCALC 101 (H) 09/20/2020     Musculoskeletal: Strength & Muscle Tone: within normal limits Gait & Station: normal Patient leans: N/A  Psychiatric Specialty Exam:  Presentation  General Appearance:  Appropriate for Environment  Eye Contact: Good  Speech: Normal Rate  Speech Volume: Normal  Handedness:No data recorded  Mood and Affect  Mood: Euthymic  Affect: Appropriate   Thought Process  Thought Processes: Coherent  Descriptions of Associations:Intact  Orientation:Full (Time, Place and Person)  Thought Content:Logical  History of Schizophrenia/Schizoaffective disorder:No data recorded Duration of Psychotic Symptoms:No data  recorded Hallucinations:Hallucinations: None  Ideas of Reference:None  Suicidal Thoughts:Suicidal Thoughts: No  Homicidal Thoughts:Homicidal Thoughts: No   Sensorium  Memory: Immediate Good; Recent Good; Remote Good  Judgment: Fair  Insight: Fair   Art therapist  Concentration: Good  Attention Span: Good  Recall: Good  Fund of Knowledge: Good  Language: Good   Psychomotor Activity  Psychomotor Activity: Psychomotor Activity: Normal   Assets  Assets: Communication Skills   Sleep  Sleep: Sleep: Good    Physical Exam: Physical Exam Vitals and nursing note reviewed.  Constitutional:      Appearance: Normal appearance.  HENT:     Head: Normocephalic and atraumatic.  Eyes:     Pupils: Pupils are equal, round, and reactive to light.  Pulmonary:     Effort: Pulmonary effort is normal.  Musculoskeletal:        General: Normal range of motion.     Cervical back: Normal range of motion.  Skin:    General: Skin is warm.  Neurological:     General: No focal deficit present.     Mental Status: He is alert and oriented to person, place, and time.  Psychiatric:        Attention and Perception: Attention and perception normal.        Mood and Affect: Mood and affect normal.        Speech: Speech normal.        Behavior: Behavior normal. Behavior is cooperative.        Thought Content: Thought content normal.        Cognition and Memory: Cognition and memory normal.     Comments: Insight and judgement fair, except for substance use     Review of Systems  All other systems reviewed and are negative.  Blood pressure 125/80, pulse 72, temperature (!) 97.3 F (36.3 C), resp. rate 18, height 6\' 1"  (1.854 m), weight 83.2 kg, SpO2 100%. Body mass index is 24.21 kg/m.   Treatment Plan Summary: Patient continues to do well though he once again complains of nightmares making it difficult to sleep overnight. Denies SI/HI and AVH.  No overt symptoms  of psychosis, mania.  He is med compliant, interacts appropriately with others, has been attending groups.  Will continue to monitor closely for further treatment and stabilization.  06/24/2023 -- D/c Hinda Glatter d/t reported nightmares -- Initiate Depakote 250 mg BID for bipolar disorder, with plan to uptitrate as needed  -- Continue Wellbutrin 150 mg BID for depressive symptoms  06/23/2023 --  Continue Invega 6 mg daily for mood stabilization  -- Continue Wellbutrin 150 mg BID for depressive symptoms  -- The risks/benefits/side-effects/alternatives to this medication were discussed in detail with the patient and time was given for questions. The patient consents to medication trial. -- Metabolic profile and EKG monitoring obtained while on an atypical antipsychotic (BMI: Lipid Panel: unremarkable, HbgA1c: 5.4, QTc: has pacemaker) -- Encouraged patient to participate in unit milieu and in scheduled group therapies -- Short Term Goals: Ability to identify changes in lifestyle to reduce recurrence of condition will improve, Ability to verbalize feelings will improve, Ability to disclose and discuss suicidal ideas, Ability to demonstrate self-control will improve, Ability to identify and develop effective coping behaviors will improve, Ability to maintain clinical measurements within normal limits will improve, Compliance with prescribed medications will improve, and Ability to identify triggers associated with substance abuse/mental health issues will improve -- Long Term Goals: Improvement in symptoms so as ready for discharge     Physician Treatment Plan for Secondary Diagnosis: Principal Problem:   Bipolar disorder [facility]) Active Problems:   Hypokalemia   06/22/2023 - Hospitalist consult placed, attempted to page x2/will need replacement, will continue to reach out  06/23/2023 - pt reporting bilateral lower extremity edema, have paged hospitalist again today, awaiting to hear back  Will continue  Potassium Chloride 20 mEq BID     Long Term Goal(s): Improvement in symptoms so as ready for discharge   Short Term Goals: Compliance with prescribed medications will improve  Discharge Planning - will need housing placement vs inpatient SUD rehab   Syriana Croslin, PA-C 06/24/2023, 1:15 PM

## 2023-06-24 NOTE — Plan of Care (Signed)
  Problem: Education: Goal: Verbalization of understanding the information provided will improve Outcome: Progressing   Problem: Education: Goal: Knowledge of  General Education information/materials will improve Outcome: Progressing

## 2023-06-24 NOTE — [facility] Suicide Risk Assessment (Signed)
[facility] INPATIENT:  Family/Significant Other Suicide Prevention Education  Suicide Prevention Education:  Education Completed; Clyde Zarrella, wife, [phone], has been identified by the patient as the family member/significant other with whom the patient will be residing, and identified as the person(s) who will aid the patient in the event of a mental health crisis (suicidal ideations/suicide attempt).  With written consent from the patient, the family member/significant other has been provided the following suicide prevention education, prior to the and/or following the discharge of the patient.  The suicide prevention education provided includes the following: Suicide risk factors Suicide prevention and interventions National Suicide Hotline telephone number [facility] assessment telephone number [facility] Emergency Assistance 911 [facility] and/or Residential Mobile Crisis Unit telephone number  Request made of family/significant other to: Remove weapons (e.g., guns, rifles, knives), all items previously/currently identified as safety concern.   Remove drugs/medications (over-the-counter, prescriptions, illicit drugs), all items previously/currently identified as a safety concern.  The family member/significant other verbalizes understanding of the suicide prevention education information provided.  The family member/significant other agrees to remove the items of safety concern listed above.  The LCSWA contacted the patient wife to provide SPI. The patient wife stated that she was concerned about him being off his medication because he had became a different person. Stating that he got load and became violent which led to him not being able to return to the same living situation. The wife reported that he had no access to guns or weapons.    Marshell Levan 06/24/2023, 3:51 PM

## 2023-06-24 NOTE — Plan of Care (Signed)

## 2023-06-24 NOTE — Group Note (Signed)
Date:  06/24/2023 Time:  10:43 PM  Group Topic/Focus:  Wrap-Up Group:   The focus of this group is to help patients review their daily goal of treatment and discuss progress on daily workbooks.    Participation Level:  Active  Participation Quality:  Appropriate, Attentive, Sharing, and Supportive  Affect:  Appropriate  Cognitive:  Appropriate  Insight: Appropriate and Good  Engagement in Group:  Engaged  Modes of Intervention:  Discussion  Additional Comments:     Belva Crome 06/24/2023, 10:43 PM

## 2023-06-25 ENCOUNTER — Ambulatory Visit: Payer: Self-pay

## 2023-06-25 DIAGNOSIS — F3111 Bipolar disorder, current episode manic without psychotic features, mild: Secondary | ICD-10-CM

## 2023-06-25 NOTE — Group Note (Signed)
[facility] LCSW Group Therapy Note    Group Date: 06/25/2023 Start Time: 1330 End Time: 1430  Type of Therapy and Topic:  Group Therapy:  Overcoming Obstacles  Participation Level:  [facility] PARTICIPATION LEVEL: Active  Mood:  Description of Group:   In this group patients will be encouraged to explore what they see as obstacles to their own wellness and recovery. They will be guided to discuss their thoughts, feelings, and behaviors related to these obstacles. The group will process together ways to cope with barriers, with attention given to specific choices patients can make. Each patient will be challenged to identify changes they are motivated to make in order to overcome their obstacles. This group will be process-oriented, with patients participating in exploration of their own experiences as well as giving and receiving support and challenge from other group members.  Therapeutic Goals: 1. Patient will identify personal and current obstacles as they relate to admission. 2. Patient will identify barriers that currently interfere with their wellness or overcoming obstacles.  3. Patient will identify feelings, thought process and behaviors related to these barriers. 4. Patient will identify two changes they are willing to make to overcome these obstacles:    Summary of Patient Progress   Patient was present in group. Patient was active and supportive of other group members.  Patient shared how he has struggled with overcoming his medication obstacles.  He reports that he often feels that he is doing better and as a result he does not need to take his medication, however, over time he realizes that he should not have stopped.  Patient was attentive and appropriate throughout.  Patient displayed fair insight.     Therapeutic Modalities:   Cognitive Behavioral Therapy Solution Focused Therapy Motivational Interviewing Relapse Prevention  Therapy   Harden Mo, LCSW

## 2023-06-25 NOTE — Progress Notes (Signed)
   06/25/23 1600  Psych Admission Type (Psych Patients Only)  Admission Status Voluntary  Psychosocial Assessment  Patient Complaints None  Eye Contact Fair;Watchful  Facial Expression Other (Comment) (appropriate)  Affect Appropriate to circumstance  Speech Logical/coherent  Interaction Assertive  Motor Activity Slow  Appearance/Hygiene In scrubs  Behavior Characteristics Cooperative;Appropriate to situation  Mood Pleasant (patient reports that overall he is feeling "pretty good".)  Aggressive Behavior  Effect No apparent injury  Thought Process  Coherency WDL  Content WDL  Delusions None reported or observed  Perception WDL  Hallucination None reported or observed  Judgment WDL  Confusion None  Danger to Self  Current suicidal ideation? Denies  Agreement Not to Harm Self Yes  Description of Agreement Verbal  Danger to Others  Danger to Others None reported or observed

## 2023-06-25 NOTE — Progress Notes (Signed)
 [facility] MD Progress Note  06/25/2023 7:22 PM Peter Marsh  MRN:  [id number] Subjective:   63 year old Caucasian male, reports that overall he is feeling "pretty good."Denies suicidal ideation (SI) and homicidal ideation (HI).Expresses no complaints regarding current mental or physical state. Verbalized agreement not to harm self. Principal Problem: Bipolar disorder ([name]) Diagnosis: Principal Problem:   Bipolar disorder ([name]) Active Problems:   Hypokalemia  Total Time spent with patient: 45 minutes  Past Psychiatric History: see below  Past Medical History:  Past Medical History:  Diagnosis Date   Allergy    Anemia    as a child   Anxiety    Asthma    uses inhalers    Bilateral carotid bruits    Cardiac conduction disorder 2018   s/p MDT PPM   Cataract    Chest pain 06/24/2018   Chronic kidney disease    bladder interstial cystitis    Chronic kidney disease (CKD), stage IV (severe) ([name])    followed by Dr. Marisue Humble at [facility]   COPD (chronic obstructive pulmonary disease) ([name])    Coronary artery disease    COVID-19 virus infection 10/21/2020   Depression    Diabetes mellitus without complication ([name])    Discord with neighbors, lodgers and landlord 11/26/2021   Encounter for care of pacemaker 02/13/2019   GERD (gastroesophageal reflux disease)    Hematemesis 09/16/2016   History of kidney stones    History of stomach ulcers 2001   Hypertension    LBBB (left bundle branch block)    Lower extremity edema    Mild intermittent asthma without complication    Mixed hyperlipidemia    Mobitz type 2 second degree AV block 04/06/2019   Myocardial infarction ([name])    05-25-2022  stent x1   Neuropathy    Pacemaker: Medtronic Azure XT DR MRI [id number] PPM -  BUNDLE OF HIS pacing  02/26/2017   Scheduled Remote pacemaker check  11/12/2018:  There were 24 Fast AV episodes:  EGMs show SVTs. Episodes lasted < 2 minutes. Health trends do not demonstrate significant abnormality.  Battery longevity is 9.4 - 10.3 years. RA pacing is 47.1 %, RV pacing is 40.4 %.  Clinic check 11/07/17.    Paroxysmal atrial flutter ([name])    PONV (postoperative nausea and vomiting)    Prostatitis    Recurrent upper respiratory infection (URI)    Schizophrenia ([name])    Sinus node dysfunction ([name]) 02/13/2019   Urticaria     Past Surgical History:  Procedure Laterality Date   ADENOIDECTOMY     APPENDECTOMY     CHOLECYSTECTOMY N/A 11/30/2022   Procedure: LAPAROSCOPIC CHOLECYSTECTOMY WITH ICG DYE, LAPAROSCOPIC LYSIS OF ADHESIONS;  Surgeon: [name], MD;  Location: [facility];  Service: General;  Laterality: N/A;   COLONOSCOPY     CORONARY STENT INTERVENTION N/A 05/26/2022   Procedure: CORONARY STENT INTERVENTION;  Surgeon: Corky Crafts, MD;  Location: [facility] INVASIVE CV LAB;  Service: Cardiovascular;  Laterality: N/A;   CORONARY ULTRASOUND/IVUS N/A 05/26/2022   Procedure: Intravascular Ultrasound/IVUS;  Surgeon: Corky Crafts, MD;  Location: [facility] INVASIVE CV LAB;  Service: Cardiovascular;  Laterality: N/A;   ELECTROPHYSIOLOGY STUDY N/A 02/26/2017   Procedure: ELECTROPHYSIOLOGY STUDY;  Surgeon: Marinus Maw, MD;  Location: [facility] INVASIVE CV LAB;  Service: Cardiovascular;  Laterality: N/A;   ESOPHAGOGASTRODUODENOSCOPY (EGD) WITH PROPOFOL N/A 09/18/2016   Procedure: ESOPHAGOGASTRODUODENOSCOPY (EGD) WITH PROPOFOL;  Surgeon: Kerin Salen, MD;  Location: [facility] ENDOSCOPY;  Service: Gastroenterology;  Laterality: N/A;  LEFT HEART CATH AND CORONARY ANGIOGRAPHY N/A 05/26/2022   Procedure: LEFT HEART CATH AND CORONARY ANGIOGRAPHY;  Surgeon: Corky Crafts, MD;  Location: [facility] INVASIVE CV LAB;  Service: Cardiovascular;  Laterality: N/A;   LUMBAR LAMINECTOMY/DECOMPRESSION MICRODISCECTOMY  04/19/2011   Procedure: LUMBAR LAMINECTOMY/DECOMPRESSION MICRODISCECTOMY;  Surgeon: Jacki Cones;  Location: [facility];  Service: Orthopedics;  Laterality: Left;  Hemi LAminectomy/Microdiscectomy Lumbar four  -  Lumbar five  on the Left (X-Ray)   PACEMAKER IMPLANT N/A 02/26/2017   Procedure: PACEMAKER IMPLANT;  Surgeon: Marinus Maw, MD;  Location: [facility] INVASIVE CV LAB;  Service: Cardiovascular;  Laterality: N/A;   TONSILLECTOMY     UPPER GASTROINTESTINAL ENDOSCOPY     Family History:  Family History  Problem Relation Age of Onset   High blood pressure Mother    Alzheimer's disease Father    Heart attack Brother    Colon cancer Neg Hx    Esophageal cancer Neg Hx    Rectal cancer Neg Hx    Stomach cancer Neg Hx    Family Psychiatric  History: see above Social History:  Social History   Substance and Sexual Activity  Alcohol Use No     Social History   Substance and Sexual Activity  Drug Use No    Social History   Socioeconomic History   Marital status: Married    Spouse name: Not on file   Number of children: 1   Years of education: 12   Highest education level: High school graduate  Occupational History   Occupation: Retired  Tobacco Use   Smoking status: Former    Current packs/day: 0.00    Average packs/day: 1 pack/day for 30.0 years (30.0 ttl pk-yrs)    Types: Cigarettes    Start date: 05/08/1978    Quit date: 05/08/2008    Years since quitting: 15.1   Smokeless tobacco: Never  Vaping Use   Vaping status: Never Used  Substance and Sexual Activity   Alcohol use: No   Drug use: No   Sexual activity: Not Currently  Other Topics Concern   Not on file  Social History Narrative   Lives at home with wife.   Right-handed.   One cup caffeine per day.   Social Drivers of Corporate investment banker Strain: Low Risk  (10/06/2022)   Received from [facility], [facility]   Overall Financial Resource Strain (CARDIA)    Difficulty of Paying Living Expenses: Not very hard  Food Insecurity: No Food Insecurity (06/21/2023)   Hunger Vital Sign    Worried About Running Out of Food in the Last Year: Never true    Ran Out of Food in the Last Year: Never true   Transportation Needs: No Transportation Needs (06/21/2023)   PRAPARE - Administrator, Civil Service (Medical): No    Lack of Transportation (Non-Medical): No  Physical Activity: Insufficiently Active (10/06/2022)   Received from [facility], [facility]   Exercise Vital Sign    Days of Exercise per Week: 2 days    Minutes of Exercise per Session: 10 min  Stress: No Stress Concern Present (10/06/2022)   Received from [facility] of Occupational Health - Occupational Stress Questionnaire    Feeling of Stress : Not at all  Social Connections: Moderately Integrated (10/06/2022)   Received from [facility], [facility]   Social Network    How would you rate your social network (family, work, friends)?: Adequate  participation with social networks   Additional Social History:    History of alcohol / drug use?: Yes (cocaine, crack) Negative Consequences of Use: Legal (B/E, felony charge) Name of Substance 1: Cocaine/crack 1 - Last Use / Amount: years ago                  Sleep: Good  Appetite:  Good  Current Medications: Current Facility-Administered Medications  Medication Dose Route Frequency Provider Last Rate Last Admin   acetaminophen (TYLENOL) tablet 650 mg  650 mg Oral Q6H PRN Tingling, Stephanie, PA-C   650 mg at 06/21/23 2319   alum & mag hydroxide-simeth (MAALOX/MYLANTA) 200-200-20 MG/5ML suspension 30 mL  30 mL Oral Q4H PRN Tingling, Stephanie, PA-C       buPROPion (WELLBUTRIN SR) 12 hr tablet 150 mg  150 mg Oral BID Tingling, Stephanie, PA-C   150 mg at 06/25/23 1736   cloNIDine (CATAPRES) tablet 0.2 mg  0.2 mg Oral TID Tingling, Stephanie, PA-C   0.2 mg at 06/25/23 1735   haloperidol lactate (HALDOL) injection 5 mg  5 mg Intramuscular TID PRN Tingling, Stephanie, PA-C       And   diphenhydrAMINE (BENADRYL) injection 50 mg  50 mg Intramuscular TID PRN Tingling, Stephanie, PA-C       And   LORazepam (ATIVAN)  injection 2 mg  2 mg Intramuscular TID PRN Tingling, Stephanie, PA-C       divalproex (DEPAKOTE) DR tablet 250 mg  250 mg Oral Q12H Tingling, Stephanie, PA-C   250 mg at 06/25/23 0846   gabapentin (NEURONTIN) capsule 100 mg  100 mg Oral BID Tingling, Stephanie, PA-C   100 mg at 06/25/23 1735   hydrALAZINE (APRESOLINE) tablet 100 mg  100 mg Oral TID Tingling, Stephanie, PA-C   100 mg at 06/25/23 1736   isosorbide dinitrate (ISORDIL) tablet 30 mg  30 mg Oral TID Tingling, Stephanie, PA-C   30 mg at 06/25/23 1736   metoprolol succinate (TOPROL-XL) 24 hr tablet 100 mg  100 mg Oral Daily Tingling, Stephanie, PA-C   100 mg at 06/25/23 0845   mometasone-formoterol (DULERA) 200-5 MCG/ACT inhaler 2 puff  2 puff Inhalation BID Tingling, Stephanie, PA-C   2 puff at 06/25/23 0846   potassium chloride (KLOR-CON) packet 20 mEq  20 mEq Oral BID Tingling, Stephanie, PA-C   20 mEq at 06/25/23 1734   Facility-Administered Medications Ordered in Other Encounters  Medication Dose Route Frequency Provider Last Rate Last Admin   acetaminophen (OFIRMEV) IVPB    PRN Illene Silver, CRNA   1,000 mg at 04/19/11 0910   glycopyrrolate (ROBINUL) injection    PRN Illene Silver, CRNA   0.8 mg at 04/19/11 1037    Lab Results: No results found for this or any previous visit (from the past 48 hours).  Blood Alcohol level:  Lab Results  Component Value Date   ETH <10 06/20/2023   ETH <10 10/19/2020    Metabolic Disorder Labs: Lab Results  Component Value Date   HGBA1C 5.4 06/22/2023   MPG 108.28 06/22/2023   MPG 114.02 11/27/2022   No results found for: "PROLACTIN" Lab Results  Component Value Date   CHOL 118 06/22/2023   TRIG 116 06/22/2023   HDL 25 (L) 06/22/2023   CHOLHDL 4.7 06/22/2023   VLDL 23 06/22/2023   LDLCALC 70 06/22/2023   LDLCALC 101 (H) 09/20/2020    Physical Findings: AIMS:  , ,  ,  ,    CIWA:  COWS:     Musculoskeletal: Strength & Muscle Tone: within normal limits Gait & Station:  normal Patient leans: N/A  Psychiatric Specialty Exam:  Presentation  General Appearance:  Appropriate for Environment; Fairly Groomed  Eye Contact: Minimal  Speech: Clear and Coherent; Normal Rate  Speech Volume: Normal  Handedness:Right   Mood and Affect  Mood: Euthymic  Affect: Appropriate; Congruent   Thought Process  Thought Processes: Coherent  Descriptions of Associations:Intact  Orientation:Full (Time, Place and Person)  Thought Content:WDL  History of Schizophrenia/Schizoaffective disorder:No  Duration of Psychotic Symptoms:N/A  Hallucinations:Hallucinations: None  Ideas of Reference:None  Suicidal Thoughts:Suicidal Thoughts: No  Homicidal Thoughts:Homicidal Thoughts: No   Sensorium  Memory: Immediate Good; Recent Good; Remote Good  Judgment: Good (appropriate insight and decision-making capacity.)  Insight: Good   Executive Functions  Concentration: Good  Attention Span: Fair  Recall: Good  Fund of Knowledge: Good  Language: Good   Psychomotor Activity  Psychomotor Activity: Psychomotor Activity: Normal   Assets  Assets: Financial Resources/Insurance   Sleep  Sleep: Sleep: Good Number of Hours of Sleep: 7    Physical Exam: Physical Exam Vitals and nursing note reviewed.  Constitutional:      Appearance: Normal appearance.  HENT:     Head: Normocephalic and atraumatic.     Nose: Nose normal.  Pulmonary:     Effort: Pulmonary effort is normal.  Musculoskeletal:        General: Normal range of motion.     Cervical back: Normal range of motion.  Neurological:     General: No focal deficit present.     Mental Status: He is alert and oriented to person, place, and time. Mental status is at baseline.  Psychiatric:        Attention and Perception: Attention and perception normal.        Mood and Affect: Mood is anxious. Affect is flat.        Speech: Speech normal.        Behavior: Behavior normal.  Behavior is cooperative.        Thought Content: Thought content normal.        Cognition and Memory: Cognition and memory normal.        Judgment: Judgment normal.    Review of Systems  All other systems reviewed and are negative.  Blood pressure 127/76, pulse 75, temperature 98 F (36.7 C), resp. rate 16, height 6\' 1"  (1.854 m), weight 83.2 kg, SpO2 100%. Body mass index is 24.21 kg/m.   Treatment Plan Summary: Daily contact with patient to assess and evaluate symptoms and progress in treatment and Medication management Depakote (Valproate) 250 mg BID for bipolar disorder, with plan to up-titrate as needed based on response and tolerability. Wellbutrin 150 mg BID for depressive symptoms and energy improvement. Invega (Paliperidone) 6 mg PO daily for mood stabilization. Assess response to medication adjustments before considering discharge. Ensure patient has outpatient follow-up for medication management.  Provide psychoeducation on medication adherence and mood disorder management. Myriam Forehand, NP 06/25/2023, 7:22 PM

## 2023-06-25 NOTE — Group Note (Signed)
Date:  06/25/2023 Time:  10:17 AM  Group Topic/Focus:  Diagnosis Education:   The focus of this group is to discuss the major disorders that patients maybe diagnosed with.  Group discusses the importance of knowing what one's diagnosis is so that one can understand treatment and better advocate for oneself. Healthy Communication:   The focus of this group is to discuss communication, barriers to communication, as well as healthy ways to communicate with others.    Participation Level:  Active  Participation Quality:  Appropriate, Attentive, Sharing, and Supportive  Affect:  Appropriate  Cognitive:  Alert, Appropriate, and Oriented  Insight: Appropriate  Engagement in Group:  Developing/Improving and Engaged  Modes of Intervention:  Activity, Discussion, and Education  Additional Comments:    Rosaura Carpenter 06/25/2023, 10:17 AM

## 2023-06-25 NOTE — Plan of Care (Signed)
  Problem: Education: Goal: Knowledge of [facility] Education information/materials will improve Outcome: Progressing   Problem: Education: Goal: Emotional status will improve Outcome: Progressing   Problem: Education: Goal: Mental status will improve Outcome: Progressing   

## 2023-06-25 NOTE — Group Note (Signed)
Date:  06/25/2023 Time:  10:48 PM  Group Topic/Focus:  Self Care:   The focus of this group is to help patients understand the importance of self-care in order to improve or restore emotional, physical, spiritual, interpersonal, and financial health. Wellness Toolbox:   The focus of this group is to discuss various aspects of wellness, balancing those aspects and exploring ways to increase the ability to experience wellness.  Patients will create a wellness toolbox for use upon discharge. Wrap-Up Group:   The focus of this group is to help patients review their daily goal of treatment and discuss progress on daily workbooks.    Participation Level:  Minimal  Participation Quality:  Appropriate  Affect:  Appropriate  Cognitive:  Alert and Appropriate  Insight: Good  Engagement in Group:  Lacking and Limited  Modes of Intervention:  Discussion and Support  Additional Comments:     Maglione,Tere Mcconaughey E 06/25/2023, 10:48 PM

## 2023-06-25 NOTE — Group Note (Signed)
Recreation Therapy Group Note   Group Topic:Coping Skills  Group Date: 06/25/2023 Start Time: 1000 End Time: 1045 Facilitators: Rosina Lowenstein, LRT, CTRS Location:  Craft Room  Group Description: Mind Map.  Patient was provided a blank template of a diagram with 32 blank boxes in a tiered system, branching from the center (similar to a bubble chart). LRT directed patients to label the middle of the diagram "Coping Skills". LRT and patients then came up with 8 different coping skills as examples. Pt were directed to record their coping skills in the 2nd tier boxes closest to the center.  Patients would then share their coping skills with the group as LRT wrote them out. LRT gave a handout of 99 different coping skills at the end of group.   Goal Area(s) Addressed: Patients will be able to define "coping skills". Patient will identify new coping skills.  Patient will increase communication.   Affect/Mood: N/A   Participation Level: Did not attend    Clinical Observations/Individualized Feedback: Patient did not attend group.   Plan: Continue to engage patient in RT group sessions 2-3x/week.   Rosina Lowenstein, LRT, CTRS 06/25/2023 12:33 PM

## 2023-06-25 NOTE — Plan of Care (Signed)

## 2023-06-25 NOTE — Progress Notes (Signed)
   06/24/23 2000  Psych Admission Type (Psych Patients Only)  Admission Status Voluntary  Psychosocial Assessment  Patient Complaints Depression  Eye Contact Fair  Facial Expression Anxious  Affect Sad  Speech Soft  Interaction Assertive  Motor Activity Unsteady;Slow  Appearance/Hygiene In scrubs  Behavior Characteristics Cooperative;Appropriate to situation;Calm  Mood Pleasant  Thought Process  Coherency WDL  Content Blaming others  Delusions None reported or observed  Perception WDL  Hallucination None reported or observed  Judgment Poor  Confusion None  Danger to Self  Current suicidal ideation? Denies  Agreement Not to Harm Self Yes  Description of Agreement verbal  Danger to Others  Danger to Others None reported or observed   No distress noted, patient alert and oriented x 4, affect is congruent, thoughts organized, denies SI/HI/AVH, 15 minutes safety checks maintained.

## 2023-06-26 DIAGNOSIS — F3111 Bipolar disorder, current episode manic without psychotic features, mild: Secondary | ICD-10-CM | POA: Diagnosis not present

## 2023-06-26 NOTE — Group Note (Signed)
Date:  06/26/2023 Time:  10:30 PM  Group Topic/Focus:  Wrap-Up Group:   The focus of this group is to help patients review their daily goal of treatment and discuss progress on daily workbooks.    Participation Level:  Did Not Attend   Maglione,Edna Grover E 06/26/2023, 10:30 PM

## 2023-06-26 NOTE — Progress Notes (Signed)
Patient presents with sad, flat affect. Denies SI, HI, AVH. Medication compliant. Appropriate with staff and peers.Isolative to self. No concerns voiced. \ Encouragement and support provided. Safety checks maintained. Medications given as prescribed. Pt receptive and remains safe on unit with q 15 min checks.

## 2023-06-26 NOTE — Group Note (Signed)
[facility] LCSW Group Therapy Note   Group Date: 06/26/2023 Start Time: 1300 End Time: 1400   Type of Therapy/Topic:  Group Therapy:  Emotion Regulation  Participation Level:  Did Not Attend   Mood:  Description of Group:    The purpose of this group is to assist patients in learning to regulate negative emotions and experience positive emotions. Patients will be guided to discuss ways in which they have been vulnerable to their negative emotions. These vulnerabilities will be juxtaposed with experiences of positive emotions or situations, and patients challenged to use positive emotions to combat negative ones. Special emphasis will be placed on coping with negative emotions in conflict situations, and patients will process healthy conflict resolution skills.  Therapeutic Goals: Patient will identify two positive emotions or experiences to reflect on in order to balance out negative emotions:  Patient will label two or more emotions that they find the most difficult to experience:  Patient will be able to demonstrate positive conflict resolution skills through discussion or role plays:   Summary of Patient Progress: Patient did not attend group.     Therapeutic Modalities:   Cognitive Behavioral Therapy Feelings Identification Dialectical Behavioral Therapy   Lowry Ram, LCSW

## 2023-06-26 NOTE — Plan of Care (Signed)
Patient stated that he is feeling better and slept good. Patient denies SI,HI and AVH. Patient takes naps between meals. Appetite good.Appropriate with staff & peers. Support and encouragement given.

## 2023-06-26 NOTE — Group Note (Signed)
Date:  06/26/2023 Time:  10:03 AM  Group Topic/Focus:  Dimensions of Wellness:   The focus of this group is to introduce the topic of wellness and discuss the role each dimension of wellness plays in total health. Self Care:   The focus of this group is to help patients understand the importance of self-care in order to improve or restore emotional, physical, spiritual, interpersonal, and financial health.    Participation Level:  Active  Participation Quality:  Appropriate, Attentive, Sharing, and Supportive  Affect:  Appropriate  Cognitive:  Alert, Appropriate, and Oriented  Insight: Appropriate and Improving  Engagement in Group:  Developing/Improving and Engaged  Modes of Intervention:  Activity, Discussion, Education, and Socialization  Additional Comments:    Peter Marsh 06/26/2023, 10:03 AM

## 2023-06-26 NOTE — Group Note (Signed)
Recreation Therapy Group Note   Group Topic:Goal Setting  Group Date: 06/26/2023 Start Time: 1000 End Time: 1100 Facilitators: Rosina Lowenstein, LRT, CTRS Location:  Craft Room  Group Description: Product/process development scientist. Patients were given many different magazines, a glue stick, markers, and a piece of cardstock paper. LRT and pts discussed the importance of having goals in life. LRT and pts discussed the difference between short-term and long-term goals, as well as what a SMART goal is. LRT encouraged pts to create a vision board, with images they picked and then cut out with safety scissors from the magazine, for themselves, that capture their short and long-term goals. LRT encouraged pts to show and explain their vision board to the group.   Goal Area(s) Addressed:  Patient will gain knowledge of short vs. long term goals.  Patient will identify goals for themselves. Patient will practice setting SMART goals. Patient will verbalize their goals to LRT and peers.   Affect/Mood: N/A   Participation Level: Did not attend    Clinical Observations/Individualized Feedback: Patient did not attend group.   Plan: Continue to engage patient in RT group sessions 2-3x/week.   Rosina Lowenstein, LRT, CTRS 06/26/2023 12:09 PM

## 2023-06-26 NOTE — Group Note (Signed)
Date:  06/26/2023 Time:  3:21 PM  Group Topic/Focus:  Activity Group: The focus of the group is to promote activity for the patients and to encourage them to go outside to the courtyard to get some fresh air and some exercise.    Participation Level:  Did Not Attend   Peter Marsh 06/26/2023, 3:21 PM

## 2023-06-26 NOTE — Plan of Care (Signed)
   Problem: Education: Goal: Emotional status will improve Outcome: Progressing Goal: Mental status will improve Outcome: Progressing   Problem: Activity: Goal: Interest or engagement in activities will improve Outcome: Progressing

## 2023-06-27 DIAGNOSIS — F3111 Bipolar disorder, current episode manic without psychotic features, mild: Secondary | ICD-10-CM | POA: Diagnosis not present

## 2023-06-27 NOTE — Progress Notes (Signed)
   06/27/23 0100  Psych Admission Type (Psych Patients Only)  Admission Status Voluntary  Psychosocial Assessment  Patient Complaints None  Eye Contact Fair  Facial Expression Other (Comment)  Affect Appropriate to circumstance  Speech Logical/coherent  Interaction Assertive  Motor Activity Slow  Appearance/Hygiene In scrubs  Behavior Characteristics Cooperative;Appropriate to situation  Mood Pleasant  Aggressive Behavior  Effect No apparent injury  Thought Process  Coherency WDL  Content WDL  Delusions None reported or observed  Perception WDL  Hallucination None reported or observed  Judgment Impaired  Confusion None  Danger to Self  Current suicidal ideation? Denies  Agreement Not to Harm Self Yes  Description of Agreement verbal

## 2023-06-27 NOTE — Group Note (Signed)
[facility] LCSW Group Therapy Note   Group Date: 06/27/2023 Start Time: 1300 End Time: 1320  Type of Therapy and Topic:  Group Therapy:  Feelings around Relapse and Recovery  Participation Level:  Did Not Attend    Description of Group:    Patients in this group will discuss emotions they experience before and after a relapse. They will process how experiencing these feelings, or avoidance of experiencing them, relates to having a relapse. Facilitator will guide patients to explore emotions they have related to recovery. Patients will be encouraged to process which emotions are more powerful. They will be guided to discuss the emotional reaction significant others in their lives may have to patients' relapse or recovery. Patients will be assisted in exploring ways to respond to the emotions of others without this contributing to a relapse.  Therapeutic Goals: Patient will identify two or more emotions that lead to relapse for them:  Patient will identify two emotions that result when they relapse:  Patient will identify two emotions related to recovery:  Patient will demonstrate ability to communicate their needs through discussion and/or role plays.   Summary of Patient Progress: X   Therapeutic Modalities:   Cognitive Behavioral Therapy Solution-Focused Therapy Assertiveness Training Relapse Prevention Therapy   Glenis Smoker, LCSW

## 2023-06-27 NOTE — Plan of Care (Signed)
Patient rated his depression and anxiety 0/10. Patient stated that Renato Gails in his church will arrange a place for him to live upon discharge. Patient stayed in bed bed most of the shift. Denies SI,HI and AVH. Compliant with medications. Appetite and energy level good. Support and encouragement given.

## 2023-06-27 NOTE — [facility] Counselor (Signed)
CSW met wit the patient to discuss discharge planning.    Patient began to attempt to cry, though no tears were evidenced.  Patient stated "ma'am I am going to die if I go to the street".    CSW asked if patient can go to family or has funding for a hotel.   Pt reports that he makes $1000 a month, but doesn't think he can afford a hotel.  He reports that his family is deceased.    CSW pointed out that pt reported there he received a large lump sum settlement per patient's reports in treatment team.  Patient stated that his wife has all of of his belongings and paperwork and he can not access it.  CSW pointed out that per initial report patient's landlord reported that he can return to get his belongings, though patient will not be allowed to stay in the home.  CSW informed patient that CSW can assist patient in getting to his home to get his belongings, however, pt would need to arrange his transportation from there to where he will be staying.   CSW provided patient with shelter list as well.   Penni Homans, MSW, LCSW 06/27/2023 1:53 PM

## 2023-06-27 NOTE — Plan of Care (Signed)
   Problem: Education: Goal: Knowledge of [place] [facility] Education information/materials will improve Outcome: Progressing Goal: Emotional status will improve Outcome: Progressing

## 2023-06-27 NOTE — Group Note (Signed)
Recreation Therapy Group Note   Group Topic:Other  Group Date: 06/27/2023 Start Time: 1000 End Time: 1050 Facilitators: Clinton Gallant, CTRS Location:  Craft Room  Activity Description/Intervention: Therapeutic Drumming. Patients with peers and staff were given the opportunity to engage in a leader facilitated [facility] Group Empowerment Drumming Circle with staff from the [employer], in partnership with [employer]. Teaching laboratory technician and trained [employer], Theodoro Doing leading with LRT observing and documenting intervention and pt response. This evidenced-based practice targets 7 areas of health and wellbeing in the human experience including: stress-reduction, exercise, self-expression, camaraderie/support, nurturing, spirituality, and music-making (leisure).    Goal Area(s) Addresses:  Patient will engage in pro-social way in music group.  Patient will follow directions of drum leader on the first prompt. Patient will demonstrate no behavioral issues during group.  Patient will identify if a reduction in stress level occurs as a result of participation in therapeutic drum circle.    Affect/Mood: N/A   Participation Level: Did not attend    Clinical Observations/Individualized Feedback: Patient did not attend group.   Plan: Continue to engage patient in RT group sessions 2-3x/week.   Rosina Lowenstein, LRT, CTRS 06/27/2023 11:54 AM

## 2023-06-27 NOTE — Progress Notes (Addendum)
[facility] MD Progress Note  06/26/2023 6:06 PM Peter Marsh  MRN:  [id number] Subjective:   63 year old Caucasian male,reports feeling better and states he slept well. Denies suicidal ideation (SI), homicidal ideation (HI), and auditory/visual hallucinations (AVH).Reports taking naps between meals. Principal Problem: Bipolar disorder ([name]) Diagnosis: Principal Problem:   Bipolar disorder ([name]) Active Problems:   Schizophrenia ([name])   Hypokalemia  Total Time spent with patient: 45 minutes  Past Psychiatric History: see below  Past Medical History:  Past Medical History:  Diagnosis Date   Allergy    Anemia    as a child   Anxiety    Asthma    uses inhalers    Bilateral carotid bruits    Cardiac conduction disorder 2018   s/p MDT PPM   Cataract    Chest pain 06/24/2018   Chronic kidney disease    bladder interstial cystitis    Chronic kidney disease (CKD), stage IV (severe) ([name])    followed by Dr. Marisue Humble at [facility]   COPD (chronic obstructive pulmonary disease) ([name])    Coronary artery disease    COVID-19 virus infection 10/21/2020   Depression    Diabetes mellitus without complication ([name])    Discord with neighbors, lodgers and landlord 11/26/2021   Encounter for care of pacemaker 02/13/2019   GERD (gastroesophageal reflux disease)    Hematemesis 09/16/2016   History of kidney stones    History of stomach ulcers 2001   Hypertension    LBBB (left bundle branch block)    Lower extremity edema    Mild intermittent asthma without complication    Mixed hyperlipidemia    Mobitz type 2 second degree AV block 04/06/2019   Myocardial infarction ([name])    05-25-2022  stent x1   Neuropathy    Pacemaker: Medtronic Azure XT DR MRI [id number] PPM -  BUNDLE OF HIS pacing  02/26/2017   Scheduled Remote pacemaker check  11/12/2018:  There were 24 Fast AV episodes:  EGMs show SVTs. Episodes lasted < 2 minutes. Health trends do not demonstrate significant abnormality. Battery  longevity is 9.4 - 10.3 years. RA pacing is 47.1 %, RV pacing is 40.4 %.  Clinic check 11/07/17.    Paroxysmal atrial flutter ([name])    PONV (postoperative nausea and vomiting)    Prostatitis    Recurrent upper respiratory infection (URI)    Schizophrenia ([name])    Sinus node dysfunction ([name]) 02/13/2019   Urticaria     Past Surgical History:  Procedure Laterality Date   ADENOIDECTOMY     APPENDECTOMY     CHOLECYSTECTOMY N/A 11/30/2022   Procedure: LAPAROSCOPIC CHOLECYSTECTOMY WITH ICG DYE, LAPAROSCOPIC LYSIS OF ADHESIONS;  Surgeon: [name], MD;  Location: [facility];  Service: General;  Laterality: N/A;   COLONOSCOPY     CORONARY STENT INTERVENTION N/A 05/26/2022   Procedure: CORONARY STENT INTERVENTION;  Surgeon: Corky Crafts, MD;  Location: [facility] INVASIVE CV LAB;  Service: Cardiovascular;  Laterality: N/A;   CORONARY ULTRASOUND/IVUS N/A 05/26/2022   Procedure: Intravascular Ultrasound/IVUS;  Surgeon: Corky Crafts, MD;  Location: [facility] INVASIVE CV LAB;  Service: Cardiovascular;  Laterality: N/A;   ELECTROPHYSIOLOGY STUDY N/A 02/26/2017   Procedure: ELECTROPHYSIOLOGY STUDY;  Surgeon: Marinus Maw, MD;  Location: [facility] INVASIVE CV LAB;  Service: Cardiovascular;  Laterality: N/A;   ESOPHAGOGASTRODUODENOSCOPY (EGD) WITH PROPOFOL N/A 09/18/2016   Procedure: ESOPHAGOGASTRODUODENOSCOPY (EGD) WITH PROPOFOL;  Surgeon: Kerin Salen, MD;  Location: [facility] ENDOSCOPY;  Service: Gastroenterology;  Laterality: N/A;   LEFT  HEART CATH AND CORONARY ANGIOGRAPHY N/A 05/26/2022   Procedure: LEFT HEART CATH AND CORONARY ANGIOGRAPHY;  Surgeon: Corky Crafts, MD;  Location: [facility] INVASIVE CV LAB;  Service: Cardiovascular;  Laterality: N/A;   LUMBAR LAMINECTOMY/DECOMPRESSION MICRODISCECTOMY  04/19/2011   Procedure: LUMBAR LAMINECTOMY/DECOMPRESSION MICRODISCECTOMY;  Surgeon: Jacki Cones;  Location: [facility] ORS;  Service: Orthopedics;  Laterality: Left;  Hemi LAminectomy/Microdiscectomy Lumbar four  - Lumbar five   on the Left (X-Ray)   PACEMAKER IMPLANT N/A 02/26/2017   Procedure: PACEMAKER IMPLANT;  Surgeon: Marinus Maw, MD;  Location: [facility] INVASIVE CV LAB;  Service: Cardiovascular;  Laterality: N/A;   TONSILLECTOMY     UPPER GASTROINTESTINAL ENDOSCOPY     Family History:  Family History  Problem Relation Age of Onset   High blood pressure Mother    Alzheimer's disease Father    Heart attack Brother    Colon cancer Neg Hx    Esophageal cancer Neg Hx    Rectal cancer Neg Hx    Stomach cancer Neg Hx    Family Psychiatric  History: see above Social History:  Social History   Substance and Sexual Activity  Alcohol Use No     Social History   Substance and Sexual Activity  Drug Use No    Social History   Socioeconomic History   Marital status: Married    Spouse name: Not on file   Number of children: 1   Years of education: 12   Highest education level: High school graduate  Occupational History   Occupation: Retired  Tobacco Use   Smoking status: Former    Current packs/day: 0.00    Average packs/day: 1 pack/day for 30.0 years (30.0 ttl pk-yrs)    Types: Cigarettes    Start date: 05/08/1978    Quit date: 05/08/2008    Years since quitting: 15.1   Smokeless tobacco: Never  Vaping Use   Vaping status: Never Used  Substance and Sexual Activity   Alcohol use: No   Drug use: No   Sexual activity: Not Currently  Other Topics Concern   Not on file  Social History Narrative   Lives at home with wife.   Right-handed.   One cup caffeine per day.   Social Drivers of Corporate investment banker Strain: Low Risk  (10/06/2022)   Received from [facility]   Overall Financial Resource Strain (CARDIA)    Difficulty of Paying Living Expenses: Not very hard  Food Insecurity: No Food Insecurity (06/21/2023)   Hunger Vital Sign    Worried About Running Out of Food in the Last Year: Never true    Ran Out of Food in the Last Year: Never true  Transportation Needs: No  Transportation Needs (06/21/2023)   PRAPARE - Administrator, Civil Service (Medical): No    Lack of Transportation (Non-Medical): No  Physical Activity: Insufficiently Active (10/06/2022)   Received from [facility]   Exercise Vital Sign    Days of Exercise per Week: 2 days    Minutes of Exercise per Session: 10 min  Stress: No Stress Concern Present (10/06/2022)   Received from [facility] - Occupational Stress Questionnaire    Feeling of Stress : Not at all  Social Connections: Moderately Integrated (10/06/2022)   Received from [facility], [facility]   Social Network    How would you rate your social network (family, work, friends)?: Adequate participation  with social networks   Additional Social History:    History of alcohol / drug use?: Yes (cocaine, crack) Negative Consequences of Use: Legal (B/E, felony charge) Name of Substance 1: Cocaine/crack 1 - Last Use / Amount: years ago                  Sleep: Good  Appetite:  Good  Current Medications: Current Facility-Administered Medications  Medication Dose Route Frequency Provider Last Rate Last Admin   acetaminophen (TYLENOL) tablet 650 mg  650 mg Oral Q6H PRN Tingling, Stephanie, PA-C   650 mg at 06/21/23 2319   alum & mag hydroxide-simeth (MAALOX/MYLANTA) 200-200-20 MG/5ML suspension 30 mL  30 mL Oral Q4H PRN Tingling, Stephanie, PA-C   30 mL at 06/26/23 2033   buPROPion (WELLBUTRIN SR) 12 hr tablet 150 mg  150 mg Oral BID Tingling, Stephanie, PA-C   150 mg at 06/27/23 0900   cloNIDine (CATAPRES) tablet 0.2 mg  0.2 mg Oral TID Tingling, Stephanie, PA-C   0.2 mg at 06/27/23 0900   haloperidol lactate (HALDOL) injection 5 mg  5 mg Intramuscular TID PRN Tingling, Stephanie, PA-C       And   diphenhydrAMINE (BENADRYL) injection 50 mg  50 mg Intramuscular TID PRN Tingling, Stephanie, PA-C       And   LORazepam (ATIVAN) injection  2 mg  2 mg Intramuscular TID PRN Tingling, Stephanie, PA-C       divalproex (DEPAKOTE) DR tablet 250 mg  250 mg Oral Q12H Tingling, Stephanie, PA-C   250 mg at 06/27/23 0900   gabapentin (NEURONTIN) capsule 100 mg  100 mg Oral BID Tingling, Stephanie, PA-C   100 mg at 06/27/23 0900   hydrALAZINE (APRESOLINE) tablet 100 mg  100 mg Oral TID Tingling, Stephanie, PA-C   100 mg at 06/27/23 0900   isosorbide dinitrate (ISORDIL) tablet 30 mg  30 mg Oral TID Tingling, Stephanie, PA-C   30 mg at 06/27/23 0900   metoprolol succinate (TOPROL-XL) 24 hr tablet 100 mg  100 mg Oral Daily Tingling, Stephanie, PA-C   100 mg at 06/27/23 0900   mometasone-formoterol (DULERA) 200-5 MCG/ACT inhaler 2 puff  2 puff Inhalation BID Tingling, Stephanie, PA-C   2 puff at 06/26/23 2128   potassium chloride (KLOR-CON) packet 20 mEq  20 mEq Oral BID Tingling, Stephanie, PA-C   20 mEq at 06/27/23 0900   Facility-Administered Medications Ordered in Other Encounters  Medication Dose Route Frequency Provider Last Rate Last Admin   acetaminophen (OFIRMEV) IVPB    PRN Illene Silver, CRNA   1,000 mg at 04/19/11 0910   glycopyrrolate (ROBINUL) injection    PRN Illene Silver, CRNA   0.8 mg at 04/19/11 1037    Lab Results: No results found for this or any previous visit (from the past 48 hours).  Blood Alcohol level:  Lab Results  Component Value Date   ETH <10 06/20/2023   ETH <10 10/19/2020    Metabolic Disorder Labs: Lab Results  Component Value Date   HGBA1C 5.4 06/22/2023   MPG 108.28 06/22/2023   MPG 114.02 11/27/2022   No results found for: "PROLACTIN" Lab Results  Component Value Date   CHOL 118 06/22/2023   TRIG 116 06/22/2023   HDL 25 (L) 06/22/2023   CHOLHDL 4.7 06/22/2023   VLDL 23 06/22/2023   LDLCALC 70 06/22/2023   LDLCALC 101 (H) 09/20/2020    Physical Findings: AIMS:  , ,  ,  ,    CIWA:  COWS:     Musculoskeletal: Strength & Muscle Tone: within normal limits Gait & Station:  normal Patient leans: N/A  Psychiatric Specialty Exam:  Presentation  General Appearance:  Appropriate for Environment; Neat (Cooperative, appropriate with staff and peers, but not attending group activities.)  Eye Contact: Good  Speech: Clear and Coherent; Normal Rate  Speech Volume: Normal  Handedness: Right   Mood and Affect  Mood: Anxious (Stable)  Affect: Flat   Thought Process  Thought Processes: Coherent; Goal Directed  Descriptions of Associations:Intact  Orientation:Full (Time, Place and Person)  Thought Content:Logical; WDL  History of Schizophrenia/Schizoaffective disorder:No  Duration of Psychotic Symptoms:Greater than six months  Hallucinations:Hallucinations: None  Ideas of Reference:None  Suicidal Thoughts:Suicidal Thoughts: No  Homicidal Thoughts:Homicidal Thoughts: No   Sensorium  Memory: Immediate Fair; Recent Fair; Remote Fair  Judgment: Fair  Insight: Fair   Art therapist  Concentration: Fair  Attention Span: Fair  Recall: Fair  Fund of Knowledge: Good  Language: Good   Psychomotor Activity  Psychomotor Activity:Psychomotor Activity: Normal   Assets  Assets: Communication Skills; Housing; Financial Resources/Insurance   Sleep  Sleep:Sleep: Good Number of Hours of Sleep: 7    Physical Exam: Physical Exam Vitals and nursing note reviewed.  Constitutional:      Appearance: Normal appearance.  HENT:     Head: Normocephalic.     Nose: Nose normal.  Pulmonary:     Effort: Pulmonary effort is normal.  Musculoskeletal:        General: Normal range of motion.     Cervical back: Normal range of motion.  Neurological:     General: No focal deficit present.     Mental Status: He is alert and oriented to person, place, and time. Mental status is at baseline.  Psychiatric:        Attention and Perception: Attention and perception normal.        Mood and Affect: Mood is anxious. Affect is  flat.        Speech: Speech normal.        Behavior: Behavior is withdrawn. Behavior is cooperative.        Thought Content: Thought content normal.        Cognition and Memory: Cognition and memory normal.        Judgment: Judgment is impulsive.    Review of Systems  Psychiatric/Behavioral:  The patient is nervous/anxious.   All other systems reviewed and are negative.  Blood pressure 132/82, pulse 68, temperature (!) 97.3 F (36.3 C), resp. rate 20, height 6\' 1"  (1.854 m), weight 83.2 kg, SpO2 97%. Body mass index is 24.21 kg/m.   Treatment Plan Summary: Daily contact with patient to assess and evaluate symptoms and progress in treatment and Medication management Depakote (Valproate) 250 mg BID for bipolar disorder, with plan to up-titrate as needed based on response and tolerability. Wellbutrin 150 mg BID for depressive symptoms and energy improvement. Invega (Paliperidone) 6 mg PO daily for mood stabilization. Assess response to medication adjustments before considering discharge. Ensure patient has outpatient follow-up for medication management.  Provide psychoeducation on medication adherence and mood disorder management Myriam Forehand, NP 2/18/205, 6:10 PM

## 2023-06-27 NOTE — Progress Notes (Signed)
[facility] MD Progress Note  06/27/2023 7:46 PM Peter Marsh  MRN:  [id number] Subjective: 63yo Caucasian male patient reports depression and anxiety at 0/10, indicating no current distress. The patient stated that his Renato Gails is arranging housing for him upon discharge. He denies suicidal ideation (SI), homicidal ideation (HI), and auditory/visual hallucinations (AV Principal Problem: Bipolar disorder ([name]) Diagnosis: Principal Problem:   Bipolar disorder ([name]) Active Problems:   Schizophrenia ([name])   Hypokalemia  Total Time spent with patient: 1 hour See below Past Psychiatric History: see below  Past Medical History:  Past Medical History:  Diagnosis Date   Allergy    Anemia    as a child   Anxiety    Asthma    uses inhalers    Bilateral carotid bruits    Cardiac conduction disorder 2018   s/p MDT PPM   Cataract    Chest pain 06/24/2018   Chronic kidney disease    bladder interstial cystitis    Chronic kidney disease (CKD), stage IV (severe) ([name])    followed by Dr. Marisue Humble at [facility]   COPD (chronic obstructive pulmonary disease) ([name])    Coronary artery disease    COVID-19 virus infection 10/21/2020   Depression    Diabetes mellitus without complication ([name])    Discord with neighbors, lodgers and landlord 11/26/2021   Encounter for care of pacemaker 02/13/2019   GERD (gastroesophageal reflux disease)    Hematemesis 09/16/2016   History of kidney stones    History of stomach ulcers 2001   Hypertension    LBBB (left bundle branch block)    Lower extremity edema    Mild intermittent asthma without complication    Mixed hyperlipidemia    Mobitz type 2 second degree AV block 04/06/2019   Myocardial infarction ([name])    05-25-2022  stent x1   Neuropathy    Pacemaker: Medtronic Azure XT DR MRI [id number] PPM -  BUNDLE OF HIS pacing  02/26/2017   Scheduled Remote pacemaker check  11/12/2018:  There were 24 Fast AV episodes:  EGMs show SVTs. Episodes lasted < 2  minutes. Health trends do not demonstrate significant abnormality. Battery longevity is 9.4 - 10.3 years. RA pacing is 47.1 %, RV pacing is 40.4 %.  Clinic check 11/07/17.    Paroxysmal atrial flutter ([name])    PONV (postoperative nausea and vomiting)    Prostatitis    Recurrent upper respiratory infection (URI)    Schizophrenia ([name])    Sinus node dysfunction ([name]) 02/13/2019   Urticaria     Past Surgical History:  Procedure Laterality Date   ADENOIDECTOMY     APPENDECTOMY     CHOLECYSTECTOMY N/A 11/30/2022   Procedure: LAPAROSCOPIC CHOLECYSTECTOMY WITH ICG DYE, LAPAROSCOPIC LYSIS OF ADHESIONS;  Surgeon: [name], MD;  Location: [facility] ORS;  Service: General;  Laterality: N/A;   COLONOSCOPY     CORONARY STENT INTERVENTION N/A 05/26/2022   Procedure: CORONARY STENT INTERVENTION;  Surgeon: Corky Crafts, MD;  Location: [facility] INVASIVE CV LAB;  Service: Cardiovascular;  Laterality: N/A;   CORONARY ULTRASOUND/IVUS N/A 05/26/2022   Procedure: Intravascular Ultrasound/IVUS;  Surgeon: Corky Crafts, MD;  Location: [facility] INVASIVE CV LAB;  Service: Cardiovascular;  Laterality: N/A;   ELECTROPHYSIOLOGY STUDY N/A 02/26/2017   Procedure: ELECTROPHYSIOLOGY STUDY;  Surgeon: Marinus Maw, MD;  Location: [facility] INVASIVE CV LAB;  Service: Cardiovascular;  Laterality: N/A;   ESOPHAGOGASTRODUODENOSCOPY (EGD) WITH PROPOFOL N/A 09/18/2016   Procedure: ESOPHAGOGASTRODUODENOSCOPY (EGD) WITH PROPOFOL;  Surgeon: Kerin Salen, MD;  Location: [facility] ENDOSCOPY;  Service: Gastroenterology;  Laterality: N/A;   LEFT HEART CATH AND CORONARY ANGIOGRAPHY N/A 05/26/2022   Procedure: LEFT HEART CATH AND CORONARY ANGIOGRAPHY;  Surgeon: Corky Crafts, MD;  Location: [facility] INVASIVE CV LAB;  Service: Cardiovascular;  Laterality: N/A;   LUMBAR LAMINECTOMY/DECOMPRESSION MICRODISCECTOMY  04/19/2011   Procedure: LUMBAR LAMINECTOMY/DECOMPRESSION MICRODISCECTOMY;  Surgeon: Jacki Cones;  Location: [facility] ORS;  Service: Orthopedics;   Laterality: Left;  Hemi LAminectomy/Microdiscectomy Lumbar four  - Lumbar five  on the Left (X-Ray)   PACEMAKER IMPLANT N/A 02/26/2017   Procedure: PACEMAKER IMPLANT;  Surgeon: Marinus Maw, MD;  Location: [facility] INVASIVE CV LAB;  Service: Cardiovascular;  Laterality: N/A;   TONSILLECTOMY     UPPER GASTROINTESTINAL ENDOSCOPY     Family History:  Family History  Problem Relation Age of Onset   High blood pressure Mother    Alzheimer's disease Father    Heart attack Brother    Colon cancer Neg Hx    Esophageal cancer Neg Hx    Rectal cancer Neg Hx    Stomach cancer Neg Hx    Family Psychiatric  History: see above Social History:  Social History   Substance and Sexual Activity  Alcohol Use No     Social History   Substance and Sexual Activity  Drug Use No    Social History   Socioeconomic History   Marital status: Married    Spouse name: Not on file   Number of children: 1   Years of education: 12   Highest education level: High school graduate  Occupational History   Occupation: Retired  Tobacco Use   Smoking status: Former    Current packs/day: 0.00    Average packs/day: 1 pack/day for 30.0 years (30.0 ttl pk-yrs)    Types: Cigarettes    Start date: 05/08/1978    Quit date: 05/08/2008    Years since quitting: 15.1   Smokeless tobacco: Never  Vaping Use   Vaping status: Never Used  Substance and Sexual Activity   Alcohol use: No   Drug use: No   Sexual activity: Not Currently  Other Topics Concern   Not on file  Social History Narrative   Lives at home with wife.   Right-handed.   One cup caffeine per day.   Social Drivers of Corporate investment banker Strain: Low Risk  (10/06/2022)   Received from [facility], [facility]   Overall Financial Resource Strain (CARDIA)    Difficulty of Paying Living Expenses: Not very hard  Food Insecurity: No Food Insecurity (06/21/2023)   Hunger Vital Sign    Worried About Running Out of Food in the Last Year: Never  true    Ran Out of Food in the Last Year: Never true  Transportation Needs: No Transportation Needs (06/21/2023)   PRAPARE - Administrator, Civil Service (Medical): No    Lack of Transportation (Non-Medical): No  Physical Activity: Insufficiently Active (10/06/2022)   Received from [facility], [facility]   Exercise Vital Sign    Days of Exercise per Week: 2 days    Minutes of Exercise per Session: 10 min  Stress: No Stress Concern Present (10/06/2022)   Received from [facility], [facility] of Occupational Health - Occupational Stress Questionnaire    Feeling of Stress : Not at all  Social Connections: Moderately Integrated (10/06/2022)   Received from [facility], [facility]   Social Network  How would you rate your social network (family, work, friends)?: Adequate participation with social networks   Additional Social History:    History of alcohol / drug use?: Yes (cocaine, crack) Negative Consequences of Use: Legal (B/E, felony charge) Name of Substance 1: Cocaine/crack 1 - Last Use / Amount: years ago                  Sleep: Good  Appetite:  Good  Current Medications: Current Facility-Administered Medications  Medication Dose Route Frequency Provider Last Rate Last Admin   acetaminophen (TYLENOL) tablet 650 mg  650 mg Oral Q6H PRN Tingling, Stephanie, PA-C   650 mg at 06/21/23 2319   alum & mag hydroxide-simeth (MAALOX/MYLANTA) 200-200-20 MG/5ML suspension 30 mL  30 mL Oral Q4H PRN Tingling, Stephanie, PA-C   30 mL at 06/27/23 1622   buPROPion (WELLBUTRIN SR) 12 hr tablet 150 mg  150 mg Oral BID Tingling, Stephanie, PA-C   150 mg at 06/27/23 1756   cloNIDine (CATAPRES) tablet 0.2 mg  0.2 mg Oral TID Tingling, Stephanie, PA-C   0.2 mg at 06/27/23 1755   haloperidol lactate (HALDOL) injection 5 mg  5 mg Intramuscular TID PRN Tingling, Stephanie, PA-C       And   diphenhydrAMINE (BENADRYL) injection 50 mg  50 mg  Intramuscular TID PRN Tingling, Stephanie, PA-C       And   LORazepam (ATIVAN) injection 2 mg  2 mg Intramuscular TID PRN Tingling, Stephanie, PA-C       divalproex (DEPAKOTE) DR tablet 250 mg  250 mg Oral Q12H Tingling, Stephanie, PA-C   250 mg at 06/27/23 0900   gabapentin (NEURONTIN) capsule 100 mg  100 mg Oral BID Tingling, Stephanie, PA-C   100 mg at 06/27/23 1755   hydrALAZINE (APRESOLINE) tablet 100 mg  100 mg Oral TID Tingling, Stephanie, PA-C   100 mg at 06/27/23 1756   isosorbide dinitrate (ISORDIL) tablet 30 mg  30 mg Oral TID Tingling, Stephanie, PA-C   30 mg at 06/27/23 1756   metoprolol succinate (TOPROL-XL) 24 hr tablet 100 mg  100 mg Oral Daily Tingling, Stephanie, PA-C   100 mg at 06/27/23 0900   mometasone-formoterol (DULERA) 200-5 MCG/ACT inhaler 2 puff  2 puff Inhalation BID Tingling, Stephanie, PA-C   2 puff at 06/27/23 0826   potassium chloride (KLOR-CON) packet 20 mEq  20 mEq Oral BID Tingling, Stephanie, PA-C   20 mEq at 06/27/23 0900   Facility-Administered Medications Ordered in Other Encounters  Medication Dose Route Frequency Provider Last Rate Last Admin   acetaminophen (OFIRMEV) IVPB    PRN Illene Silver, CRNA   1,000 mg at 04/19/11 0910   glycopyrrolate (ROBINUL) injection    PRN Illene Silver, CRNA   0.8 mg at 04/19/11 1037    Lab Results: No results found for this or any previous visit (from the past 48 hours).  Blood Alcohol level:  Lab Results  Component Value Date   ETH <10 06/20/2023   ETH <10 10/19/2020    Metabolic Disorder Labs: Lab Results  Component Value Date   HGBA1C 5.4 06/22/2023   MPG 108.28 06/22/2023   MPG 114.02 11/27/2022   No results found for: "PROLACTIN" Lab Results  Component Value Date   CHOL 118 06/22/2023   TRIG 116 06/22/2023   HDL 25 (L) 06/22/2023   CHOLHDL 4.7 06/22/2023   VLDL 23 06/22/2023   LDLCALC 70 06/22/2023   LDLCALC 101 (H) 09/20/2020    Physical Findings:  AIMS:  , ,  ,  ,    CIWA:    COWS:      Musculoskeletal: Strength & Muscle Tone: within normal limits Gait & Station: normal Patient leans: N/A  Psychiatric Specialty Exam:  Presentation  General Appearance:  Appropriate for Environment; Neat (Cooperative, appropriate with staff and peers, but not attending group activities.)  Eye Contact: Good  Speech: Clear and Coherent; Normal Rate  Speech Volume: Normal  Handedness: Right   Mood and Affect  Mood: Anxious (Stable)  Affect: Flat   Thought Process  Thought Processes: Coherent; Goal Directed  Descriptions of Associations:Intact  Orientation:Full (Time, Place and Person)  Thought Content:Logical; WDL  History of Schizophrenia/Schizoaffective disorder:No  Duration of Psychotic Symptoms:Greater than six months  Hallucinations:Hallucinations: None  Ideas of Reference:None  Suicidal Thoughts:Suicidal Thoughts: No  Homicidal Thoughts:Homicidal Thoughts: No   Sensorium  Memory: Immediate Fair; Recent Fair; Remote Fair  Judgment: Fair  Insight: Fair   Art therapist  Concentration: Fair  Attention Span: Fair  Recall: Fair  Fund of Knowledge: Good  Language: Good   Psychomotor Activity  Psychomotor Activity: Psychomotor Activity: Normal   Assets  Assets: Communication Skills; Housing; Financial Resources/Insurance   Sleep  Sleep: Sleep: Good Number of Hours of Sleep: 7    Physical Exam: Physical Exam Vitals and nursing note reviewed.  HENT:     Head: Normocephalic and atraumatic.  Pulmonary:     Effort: Pulmonary effort is normal.  Musculoskeletal:        General: Normal range of motion.     Cervical back: Normal range of motion.  Neurological:     General: No focal deficit present.     Mental Status: He is alert. Mental status is at baseline.  Psychiatric:        Attention and Perception: Attention and perception normal.        Mood and Affect: Mood is anxious. Affect is flat.         Speech: Speech normal.        Behavior: Behavior normal. Behavior is cooperative.        Thought Content: Thought content normal.        Cognition and Memory: Cognition and memory normal.        Judgment: Judgment normal.    Review of Systems  Psychiatric/Behavioral:  The patient is nervous/anxious.   All other systems reviewed and are negative.  Blood pressure 132/82, pulse 68, temperature (!) 97.3 F (36.3 C), resp. rate 20, height 6\' 1"  (1.854 m), weight 83.2 kg, SpO2 97%. Body mass index is 24.21 kg/m.   Treatment Plan Summary: Daily contact with patient to assess and evaluate symptoms and progress in treatment and Medication management Depakote (Valproate) 250 mg BID for bipolar disorder, with plan to up-titrate as needed based on response and tolerability. Wellbutrin 150 mg BID for depressive symptoms and energy improvement. Invega (Paliperidone) 6 mg PO daily for mood stabilization. Assess response to medication adjustments before considering discharge. Ensure patient has outpatient follow-up for medication management.  Provide psychoeducation on medication adherence and mood disorder management Myriam Forehand, NP 06/27/2023, 7:46 PM

## 2023-06-27 NOTE — BH IP Treatment Plan (Signed)
Interdisciplinary Treatment and Diagnostic Plan Update  06/27/2023 Time of Session: 08:30 Peter Marsh MRN: [id number]  Principal Diagnosis: Bipolar disorder [facility])  Secondary Diagnoses: Principal Problem:   Bipolar disorder ([name]) Active Problems:   Schizophrenia ([name])   Hypokalemia   Current Medications:  Current Facility-Administered Medications  Medication Dose Route Frequency Provider Last Rate Last Admin   acetaminophen (TYLENOL) tablet 650 mg  650 mg Oral Q6H PRN Tingling, Stephanie, PA-C   650 mg at 06/21/23 2319   alum & mag hydroxide-simeth (MAALOX/MYLANTA) 200-200-20 MG/5ML suspension 30 mL  30 mL Oral Q4H PRN Tingling, Stephanie, PA-C   30 mL at 06/26/23 2033   buPROPion (WELLBUTRIN SR) 12 hr tablet 150 mg  150 mg Oral BID Tingling, Stephanie, PA-C   150 mg at 06/27/23 0900   cloNIDine (CATAPRES) tablet 0.2 mg  0.2 mg Oral TID Tingling, Stephanie, PA-C   0.2 mg at 06/27/23 0900   haloperidol lactate (HALDOL) injection 5 mg  5 mg Intramuscular TID PRN Tingling, Stephanie, PA-C       And   diphenhydrAMINE (BENADRYL) injection 50 mg  50 mg Intramuscular TID PRN Tingling, Stephanie, PA-C       And   LORazepam (ATIVAN) injection 2 mg  2 mg Intramuscular TID PRN Tingling, Stephanie, PA-C       divalproex (DEPAKOTE) DR tablet 250 mg  250 mg Oral Q12H Tingling, Stephanie, PA-C   250 mg at 06/27/23 0900   gabapentin (NEURONTIN) capsule 100 mg  100 mg Oral BID Tingling, Stephanie, PA-C   100 mg at 06/27/23 0900   hydrALAZINE (APRESOLINE) tablet 100 mg  100 mg Oral TID Tingling, Stephanie, PA-C   100 mg at 06/27/23 0900   isosorbide dinitrate (ISORDIL) tablet 30 mg  30 mg Oral TID Tingling, Stephanie, PA-C   30 mg at 06/27/23 0900   metoprolol succinate (TOPROL-XL) 24 hr tablet 100 mg  100 mg Oral Daily Tingling, Stephanie, PA-C   100 mg at 06/27/23 0900   mometasone-formoterol (DULERA) 200-5 MCG/ACT inhaler 2 puff  2 puff Inhalation BID Tingling, Stephanie, PA-C   2 puff at 06/26/23  2128   potassium chloride (KLOR-CON) packet 20 mEq  20 mEq Oral BID Tingling, Stephanie, PA-C   20 mEq at 06/27/23 0900   Facility-Administered Medications Ordered in Other Encounters  Medication Dose Route Frequency Provider Last Rate Last Admin   acetaminophen (OFIRMEV) IVPB    PRN Illene Silver, CRNA   1,000 mg at 04/19/11 0910   glycopyrrolate (ROBINUL) injection    PRN Illene Silver, CRNA   0.8 mg at 04/19/11 1037   PTA Medications: Medications Prior to Admission  Medication Sig Dispense Refill Last Dose/Taking   albuterol (VENTOLIN HFA) 108 (90 Base) MCG/ACT inhaler Inhale 1-2 puffs into the lungs every 6 (six) hours as needed for wheezing or shortness of breath.      amLODipine (NORVASC) 5 MG tablet Take 1 tablet (5 mg total) by mouth daily. 90 tablet 3    aspirin 81 MG chewable tablet Chew 1 tablet (81 mg total) by mouth daily. 30 tablet 3    buPROPion (WELLBUTRIN SR) 150 MG 12 hr tablet Take 150 mg by mouth 2 (two) times daily.      cloNIDine (CATAPRES) 0.2 MG tablet Take 1 tablet (0.2 mg total) by mouth 3 (three) times daily. 270 tablet 3    clopidogrel (PLAVIX) 75 MG tablet Take 1 tablet (75 mg total) by mouth daily. (Patient not taking: Reported on 06/21/2023) 90  tablet 3    ezetimibe (ZETIA) 10 MG tablet Take 1 tablet (10 mg total) by mouth daily. 90 tablet 3    gabapentin (NEURONTIN) 300 MG capsule Take 300 mg by mouth 3 (three) times daily.      hydrALAZINE (APRESOLINE) 100 MG tablet Take 1 tablet (100 mg total) by mouth 3 (three) times daily. 270 tablet 3    isosorbide dinitrate (ISORDIL) 30 MG tablet TAKE 1 TABLET(30 MG) BY MOUTH THREE TIMES DAILY 270 tablet 3    metoprolol succinate (TOPROL-XL) 100 MG 24 hr tablet Take 1 tablet (100 mg total) by mouth 2 (two) times daily. Take with or immediately following a meal. (Patient taking differently: Take 100 mg by mouth daily. Take with or immediately following a meal.) 60 tablet 2    nitroGLYCERIN (NITROSTAT) 0.4 MG SL tablet Place  0.4 mg under the tongue every 5 (five) minutes as needed for chest pain.      ondansetron (ZOFRAN-ODT) 4 MG disintegrating tablet Take 1 tablet (4 mg total) by mouth every 8 (eight) hours as needed for nausea or vomiting. 20 tablet 0    paliperidone (INVEGA) 3 MG 24 hr tablet Take 1 tablet (3 mg total) by mouth daily. 30 tablet 2    paliperidone (INVEGA) 6 MG 24 hr tablet Take 6 mg by mouth daily.      pantoprazole (PROTONIX) 40 MG tablet TAKE 1 TABLET(40 MG) BY MOUTH TWICE DAILY 60 tablet 5    sucralfate (CARAFATE) 1 GM/10ML suspension Take 10 mLs (1 g total) by mouth 4 (four) times daily -  with meals and at bedtime. (Patient taking differently: Take 1 g by mouth daily as needed (for ulcers).) 3600 mL 3    SYMBICORT 160-4.5 MCG/ACT inhaler Inhale 2 puffs into the lungs 2 (two) times daily.      tamsulosin (FLOMAX) 0.4 MG CAPS capsule Take 0.4 mg by mouth daily. (Patient not taking: Reported on 06/21/2023)       Patient Stressors: Health problems   Marital or family conflict   Medication change or noncompliance    Patient Strengths: Careers information officer for treatment/growth   Treatment Modalities: Medication Management, Group therapy, Case management,  1 to 1 session with clinician, Psychoeducation, Recreational therapy.   Physician Treatment Plan for Primary Diagnosis: Bipolar disorder ([name]) Long Term Goal(s): Improvement in symptoms so as ready for discharge   Short Term Goals: Compliance with prescribed medications will improve  Medication Management: Evaluate patient's response, side effects, and tolerance of medication regimen.  Therapeutic Interventions: 1 to 1 sessions, Unit Group sessions and Medication administration.  Evaluation of Outcomes: Progressing  Physician Treatment Plan for Secondary Diagnosis: Principal Problem:   Bipolar disorder ([name]) Active Problems:   Schizophrenia ([name])   Hypokalemia  Long Term Goal(s): Improvement  in symptoms so as ready for discharge   Short Term Goals: Compliance with prescribed medications will improve     Medication Management: Evaluate patient's response, side effects, and tolerance of medication regimen.  Therapeutic Interventions: 1 to 1 sessions, Unit Group sessions and Medication administration.  Evaluation of Outcomes: Progressing   RN Treatment Plan for Primary Diagnosis: Bipolar disorder ([name]) Long Term Goal(s): Knowledge of disease and therapeutic regimen to maintain health will improve  Short Term Goals: Ability to remain free from injury will improve, Ability to verbalize frustration and anger appropriately will improve, Ability to demonstrate self-control, Ability to participate in decision making will improve, Ability to verbalize feelings will improve,  Ability to disclose and discuss suicidal ideas, Ability to identify and develop effective coping behaviors will improve, and Compliance with prescribed medications will improve  Medication Management: RN will administer medications as ordered by provider, will assess and evaluate patient's response and provide education to patient for prescribed medication. RN will report any adverse and/or side effects to prescribing provider.  Therapeutic Interventions: 1 on 1 counseling sessions, Psychoeducation, Medication administration, Evaluate responses to treatment, Monitor vital signs and CBGs as ordered, Perform/monitor CIWA, COWS, AIMS and Fall Risk screenings as ordered, Perform wound care treatments as ordered.  Evaluation of Outcomes: Progressing   LCSW Treatment Plan for Primary Diagnosis: Bipolar disorder ([name]) Long Term Goal(s): Safe transition to appropriate next level of care at discharge, Engage patient in therapeutic group addressing interpersonal concerns.  Short Term Goals: Engage patient in aftercare planning with referrals and resources, Increase social support, Increase ability to appropriately verbalize  feelings, Increase emotional regulation, Facilitate acceptance of mental health diagnosis and concerns, Identify triggers associated with mental health/substance abuse issues, and Increase skills for wellness and recovery  Therapeutic Interventions: Assess for all discharge needs, 1 to 1 time with Social worker, Explore available resources and support systems, Assess for adequacy in community support network, Educate family and significant other(s) on suicide prevention, Complete Psychosocial Assessment, Interpersonal group therapy.  Evaluation of Outcomes: Progressing   Progress in Treatment: Attending groups: Yes. and No. Participating in groups: Yes. Taking medication as prescribed: Yes. Toleration medication: Yes. Family/Significant other contact made: Yes, individual(s) contacted:  wife, Peter Marsh.  Patient understands diagnosis: Yes. Discussing patient identified problems/goals with staff: Yes. Medical problems stabilized or resolved: Yes. Denies suicidal/homicidal ideation: Yes. Issues/concerns per patient self-inventory: No. Other: none.  New problem(s) identified: No, Describe:  none identified. Update 06/27/23: No changes at this time.   New Short Term/Long Term Goal(s): elimination of symptoms of psychosis, medication management for mood stabilization; elimination of SI thoughts; development of comprehensive mental wellness/sobriety plan. Update 06/27/23: No changes at this time.   Patient Goals:  "To get better, to do things that are constructive, to further my education." Update 06/27/23: No changes at this time.   Discharge Plan or Barriers: CSW will assist with development of an appropriate aftercare/discharge plan. Update 06/27/23: No changes at this time.   Reason for Continuation of Hospitalization: Anxiety Depression Medication stabilization Suicidal ideation   Estimated Length of Stay: 1-7 days Update 06/27/23: No changes at this time.  Last 3 [place] Suicide  Severity Risk Score: Flowsheet Row Admission (Current) from 06/21/2023 in [facility]PATIENT BE[facility]ICINE ED from 06/20/2023 in [facility] Emergency Department at [facility] ED from 06/16/2023 in [facility] Emergency Department at [facility]  C-SSRS RISK CATEGORY No Risk No Risk No Risk       Last [facility] 2/9 Scores:    11/04/2020    8:56 AM 03/22/2020    9:25 AM 12/10/2019    2:32 PM  Depression screen PHQ 2/9  Decreased Interest 0 0 0  Down, Depressed, Hopeless 0 0 0  PHQ - 2 Score 0 0 0    Scribe for Treatment Team: Glenis Smoker, LCSW 06/27/2023 10:41 AM

## 2023-06-27 NOTE — Progress Notes (Signed)
   06/27/23 1400  Spiritual Encounters  Type of Visit Initial  Care provided to: Patient  Conversation partners present during encounter Nurse  Referral source Nurse (RN/NT/LPN)  Reason for visit Routine spiritual support  OnCall Visit Yes  Spiritual Framework  Presenting Themes Goals in life/care;Significant life change;Coping tools  Interventions  Spiritual Care Interventions Made Established relationship of care and support;Compassionate presence;Reflective listening;Narrative/life review;Prayer;Encouragement  Intervention Outcomes  Outcomes Connection to spiritual care;Awareness around self/spiritual resourses;Autonomy/agency;Reduced anxiety  Spiritual Care Plan  Spiritual Care Issues Still Outstanding No further spiritual care needs at this time (see row info)

## 2023-06-28 DIAGNOSIS — F3111 Bipolar disorder, current episode manic without psychotic features, mild: Secondary | ICD-10-CM | POA: Diagnosis not present

## 2023-06-28 MED ORDER — ISOSORBIDE DINITRATE 30 MG PO TABS: 30.0000 mg | ORAL_TABLET | Freq: Three times a day (TID) | ORAL | 0 refills | Status: AC

## 2023-06-28 MED ORDER — MOMETASONE FURO-FORMOTEROL FUM 200-5 MCG/ACT IN AERO: 2.0000 | INHALATION_SPRAY | Freq: Two times a day (BID) | RESPIRATORY_TRACT | 0 refills | Status: AC

## 2023-06-28 MED ORDER — POTASSIUM CHLORIDE 20 MEQ PO PACK: 20.0000 meq | PACK | Freq: Two times a day (BID) | ORAL | 0 refills | Status: DC

## 2023-06-28 MED ORDER — GABAPENTIN 100 MG PO CAPS: 100.0000 mg | ORAL_CAPSULE | Freq: Two times a day (BID) | ORAL | 0 refills | Status: AC

## 2023-06-28 MED ORDER — METOPROLOL SUCCINATE ER 100 MG PO TB24: 100.0000 mg | ORAL_TABLET | Freq: Every day | ORAL | 0 refills | Status: AC

## 2023-06-28 MED ORDER — BUPROPION HCL ER (SR) 150 MG PO TB12
150.0000 mg | ORAL_TABLET | Freq: Once | ORAL | Status: AC
  Administered 2023-06-28: 150 mg via ORAL
  Filled 2023-06-28: qty 1

## 2023-06-28 MED ORDER — DIVALPROEX SODIUM 250 MG PO DR TAB: 250.0000 mg | DELAYED_RELEASE_TABLET | Freq: Two times a day (BID) | ORAL | 0 refills | Status: AC

## 2023-06-28 MED ORDER — BUPROPION HCL ER (SR) 150 MG PO TB12: 300.0000 mg | ORAL_TABLET | Freq: Every day | ORAL | 0 refills | Status: AC

## 2023-06-28 NOTE — BHH Suicide Risk Assessment (Signed)
[facility] Discharge Suicide Risk Assessment   Principal Problem: Bipolar disorder [facility]) Discharge Diagnoses: Principal Problem:   Bipolar disorder ([name]) Active Problems:   Schizophrenia ([name])   Hypokalemia   Total Time spent with patient: 2 hours  Musculoskeletal: Strength & Muscle Tone: within normal limits Gait & Station: normal Patient leans: N/A  Psychiatric Specialty Exam  Presentation  General Appearance:  Neat; Appropriate for Environment (Cooperative, calm, no psychomotor agitation or retardation)  Eye Contact: Good  Speech: Clear and Coherent; Normal Rate  Speech Volume: Normal  Handedness: Right   Mood and Affect  Mood: Euthymic  Duration of Depression Symptoms: No data recorded Affect: Appropriate; Flat; Congruent   Thought Process  Thought Processes: Coherent  Descriptions of Associations:Intact  Orientation:Full (Time, Place and Person) (and situation)  Thought Content:Logical; WDL  History of Schizophrenia/Schizoaffective disorder:Yes  Duration of Psychotic Symptoms:Greater than six months  Hallucinations:Hallucinations: None  Ideas of Reference:None  Suicidal Thoughts:Suicidal Thoughts: No  Homicidal Thoughts:Homicidal Thoughts: No   Sensorium  Memory: Recent Good; Remote Good  Judgment: Fair  Insight: Fair   Art therapist  Concentration: Fair  Attention Span: Fair  Recall: Good  Fund of Knowledge: Good  Language: Good   Psychomotor Activity  Psychomotor Activity: Psychomotor Activity: Normal   Assets  Assets: Communication Skills; Financial Resources/Insurance; Social Support   Sleep  Sleep: Sleep: Good Number of Hours of Sleep: 7   Physical Exam: Physical Exam Vitals and nursing note reviewed.  Constitutional:      Appearance: Normal appearance.  HENT:     Head: Normocephalic and atraumatic.     Nose: Nose normal.  Pulmonary:     Effort: Pulmonary effort is normal.   Musculoskeletal:        General: Normal range of motion.     Cervical back: Normal range of motion.  Neurological:     General: No focal deficit present.     Mental Status: He is alert and oriented to person, place, and time. Mental status is at baseline.  Psychiatric:        Attention and Perception: Attention and perception normal.        Mood and Affect: Mood and affect normal.        Speech: Speech normal.        Behavior: Behavior normal. Behavior is cooperative.        Thought Content: Thought content normal.        Cognition and Memory: Cognition and memory normal.        Judgment: Judgment is impulsive.    Review of Systems  All other systems reviewed and are negative.  Blood pressure 132/83, pulse 67, temperature (!) 97.5 F (36.4 C), resp. rate 20, height 6\' 1"  (1.854 m), weight 83.2 kg, SpO2 98%. Body mass index is 24.21 kg/m.  Mental Status Per Nursing Assessment::   On Admission:  NA  Demographic Factors:  Male, Age 65 or older, Divorced or widowed, Caucasian, Low socioeconomic status, Living alone, and Unemployed  Loss Factors: Decline in physical health and Financial problems/change in socioeconomic status  Historical Factors: Family history of mental illness or substance abuse and Impulsivity  Risk Reduction Factors:   Sense of responsibility to family, Living with another person, especially a relative, and Positive social support  Continued Clinical Symptoms:  Bipolar Disorder:   Depressive phase Depression:   Comorbid alcohol abuse/dependence Impulsivity Insomnia Alcohol/Substance Abuse/Dependencies Schizophrenia:   Paranoid or undifferentiated type More than one psychiatric diagnosis Medical Diagnoses and Treatments/Surgeries  Cognitive Features  That Contribute To Risk:  None    Suicide Risk:  Minimal: No identifiable suicidal ideation.  Patients presenting with no risk factors but with morbid ruminations; may be classified as minimal risk  based on the severity of the depressive symptoms   Follow-up Information     [facility] Follow up.   Why: Virtual appointment is 07/04/23 at 8:30 AM via phone and will receive a call at [phone]. Contact information: [address]  [address] [place] [address] [phone]                 Plan Of Care/Follow-up recommendations:  Activity:  as tolerated Diet:  heart healthy Invega 6 mg daily - Mood stabilization for bipolar disorder. Wellbutrin SR 300 mg qD - Treatment of depressive symptoms. Divalproex (Depakote) 250 mg Q12H - Mood stabilization and seizure prevention. Gabapentin 100 mg BID - Neuropathic pain relief and mood stabilization. Clonidine 0.2 mg TID - Management of hypertension and anxiety. Metoprolol Succinate 100 mg daily - Blood pressure and heart rate control. Hydralazine 100 mg TID - Blood pressure management. Isosorbide Dinitrate 30 mg TID - Prevention of angina (chest pain). Mometasone-Formoterol (Dulera) Inhaler 2 puffs BID - Management of asthma/COPD. Potassium Chloride 20 mEq BID - Treatment of hypokalemia. [employer] [facility]: [phone], [phone]: [address], [place], [place] [address] [facility] Referral Pending: Wife is in the process of securing placement. National Suicide Prevention Lifeline: [address] Crisis Unit [place]): [phone] [facility] [place] Helpline: 671-058-1415 Crisis Text Line: Text HOME to 838-801-4938 Follow-up with primary care provider (PCP) to manage medical comorbidities. Myriam Forehand, NP 06/28/2023, 4:04 PM

## 2023-06-28 NOTE — Progress Notes (Addendum)
  [facility] Adult Case Management Discharge Plan :  Will you be returning to the same living situation after discharge:  No. Patient to go to a hotel.  At discharge, do you have transportation home?: Yes,  Patient to be provided transportation by his wife.  Do you have the ability to pay for your medications: Yes, [employer] MEDICARE / Suan Halter DUAL COMPLETE   Release of information consent forms completed and in the chart;  Patient's signature needed at discharge.  Patient to Follow up at:  [facility]    [phone]  [address] [place] [address]     Next Steps:   Follow up Instructions: Virtual appointment is 07/04/23 at 8:30 AM via phone and will receive a call at [phone].   Next level of care provider has access to [facility] Link:no  Safety Planning and Suicide Prevention discussed: Yes, Education Completed; Jasmine Maceachern, wife, [phone], has been identified by the patient as the family member/significant other with whom the patient will be residing, and identified as the person(s) who will aid the patient in the event of a mental health crisis (suicidal ideations/suicide attempt).  With written consent from the patient, the family member/significant other has been provided the following suicide prevention education, prior to the and/or following the discharge of the patient.      Has patient been referred to the Quitline?: Patient does not use tobacco/nicotine products  Patient has been referred for addiction treatment: No known substance use disorder.  Lowry Ram, LCSW 06/28/2023, 12:24 PM

## 2023-06-28 NOTE — BHH Counselor (Signed)
CSW spoke with the patient who reported that he would be picked up by his wife who would then take him to get his belongings from the home and then meet with the [employer] who was letting the patient stay with him, the church or a hotel.  Pt reports "we've been going there 20 something years he said he wouldn't see me homeless".   However, pt approached another CSW and reported that his wife was unable to get him and he needed to be dropped off at a [employer]    Patient then approached a third CSW and provided a different story.   CSW contacted patient's wife for clarification. Wife reports that she is unable to pick the patient up.  She reports that she is unprepared as she thought discharge would be on 06/29/2023.  She reports that the [employer] is not letting pt stay at his home or at the church but provided wife with a list of options where patient could stay.  She reports that she wants to meet patient at their PCP, however, CSW explained that hospital can not provide transportation to the doctor office.  She then reported that she is looking into getting the patient into an [employer].  CSW asked that wife call this CSW back within 2 hours to clarify plans.  Wife agreed.  Wife reported that she will call the landlord to confirm that patient can get his belongings.   Penni Homans, MSW, LCSW 06/28/2023 1:32 PM

## 2023-06-28 NOTE — Plan of Care (Signed)
   Problem: Education: Goal: Knowledge of [facility] Education information/materials will improve Outcome: Adequate for Discharge Goal: Emotional status will improve Outcome: Adequate for Discharge Goal: Mental status will improve Outcome: Adequate for Discharge Goal: Verbalization of understanding the information provided will improve Outcome: Adequate for Discharge   Problem: Activity: Goal: Interest or engagement in activities will improve Outcome: Adequate for Discharge Goal: Sleeping patterns will improve Outcome: Adequate for Discharge   Problem: Coping: Goal: Ability to verbalize frustrations and anger appropriately will improve Outcome: Adequate for Discharge Goal: Ability to demonstrate self-control will improve Outcome: Adequate for Discharge   Problem: Health Behavior/Discharge Planning: Goal: Identification of resources available to assist in meeting health care needs will improve Outcome: Adequate for Discharge Goal: Compliance with treatment plan for underlying cause of condition will improve Outcome: Adequate for Discharge   Problem: Physical Regulation: Goal: Ability to maintain clinical measurements within normal limits will improve Outcome: Adequate for Discharge   Problem: Safety: Goal: Periods of time without injury will increase Outcome: Adequate for Discharge   Problem: Education: Goal: Utilization of techniques to improve thought processes will improve Outcome: Adequate for Discharge Goal: Knowledge of the prescribed therapeutic regimen will improve Outcome: Adequate for Discharge   Problem: Activity: Goal: Interest or engagement in leisure activities will improve Outcome: Adequate for Discharge Goal: Imbalance in normal sleep/wake cycle will improve Outcome: Adequate for Discharge   Problem: Coping: Goal: Coping ability will improve Outcome: Adequate for Discharge Goal: Will verbalize feelings Outcome: Adequate for Discharge    Problem: Health Behavior/Discharge Planning: Goal: Ability to make decisions will improve Outcome: Adequate for Discharge Goal: Compliance with therapeutic regimen will improve Outcome: Adequate for Discharge   Problem: Role Relationship: Goal: Will demonstrate positive changes in social behaviors and relationships Outcome: Adequate for Discharge   Problem: Safety: Goal: Ability to disclose and discuss suicidal ideas will improve Outcome: Adequate for Discharge Goal: Ability to identify and utilize support systems that promote safety will improve Outcome: Adequate for Discharge   Problem: Self-Concept: Goal: Will verbalize positive feelings about self Outcome: Adequate for Discharge Goal: Level of anxiety will decrease Outcome: Adequate for Discharge

## 2023-06-28 NOTE — Progress Notes (Signed)
   06/28/23 1330  Spiritual Encounters  Type of Visit Initial  Care provided to: Patient  Conversation partners present during encounter Nurse  Referral source Nurse (RN/NT/LPN)  Reason for visit Routine spiritual support  OnCall Visit No   Chaplain visited patient at the suggestion of the staff.  Patient was concerned about housing options and Chaplain shared concerns with staff.  Chaplain offered reflective listening, compassionate presence, prayer and advocacy.  Rev. Rana M. Earlene Plater, MDiv Chaplain Resident [facility]
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
## 2023-06-28 NOTE — Group Note (Signed)
LCSW Group Therapy Note  Group Date: 06/28/2023 Start Time: 1330 End Time: 1445   Type of Therapy and Topic:  Group Therapy: Anger Cues and Responses  Participation Level:  Active   Description of Group:   In this group, patients learned how to recognize the physical, cognitive, emotional, and behavioral responses they have to anger-provoking situations.  They identified a recent time they became angry and how they reacted.  They analyzed how their reaction was possibly beneficial and how it was possibly unhelpful.  The group discussed a variety of healthier coping skills that could help with such a situation in the future.  Focus was placed on how helpful it is to recognize the underlying emotions to our anger, because working on those can lead to a more permanent solution as well as our ability to focus on the important rather than the urgent.  Therapeutic Goals: Patients will remember their last incident of anger and how they felt emotionally and physically, what their thoughts were at the time, and how they behaved. Patients will identify how their behavior at that time worked for them, as well as how it worked against them. Patients will explore possible new behaviors to use in future anger situations. Patients will learn that anger itself is normal and cannot be eliminated, and that healthier reactions can assist with resolving conflict rather than worsening situations.  Summary of Patient Progress:   Patient was active during the group. He shared that he has some experience in anger management group. He discussed hoe he and his wife separate when he becomes upset.  He demonstrated fair insight into the subject matter, was respectful of peers, and participated throughout the entire session.  Therapeutic Modalities:   Cognitive Behavioral Therapy    Harden Mo, LCSW 06/28/2023  3:09 PM

## 2023-06-28 NOTE — Plan of Care (Signed)
   Problem: Education: Goal: Knowledge of [place] [facility] Education information/materials will improve Outcome: Progressing Goal: Emotional status will improve Outcome: Progressing

## 2023-06-28 NOTE — Group Note (Signed)
Date:  06/28/2023 Time:  4:55 AM  Group Topic/Focus:  Building Self Esteem:   The Focus of this group is helping patients become aware of the effects of self-esteem on their lives, the things they and others do that enhance or undermine their self-esteem, seeing the relationship between their level of self-esteem and the choices they make and learning ways to enhance self-esteem.    Participation Level:  Did Not Attend  Participation Quality:   none  Affect:   none  Cognitive:   none  Insight: None  Engagement in Group:   none  Modes of Intervention:   none  Additional Comments:  none   Meko Masterson 06/28/2023, 4:55 AM

## 2023-06-28 NOTE — Progress Notes (Signed)
Patient ID: Peter Marsh, male   DOB: 04-14-1961, 63 y.o.   MRN: [id number]  Discharge Note:  Patient denies SI/HI/AVH at this time. Discharge instructions, AVS, prescriptions, and transition record gone over with patient. Patient declined on completing his Suicide Safety Plan. Patient agrees to comply with medication management, follow-up visit, and outpatient therapy. Patient belongings returned to patient. Patient questions and concerns addressed and answered. Patient ambulatory off unit. Patient discharged to home with his Wife.

## 2023-06-28 NOTE — Progress Notes (Signed)
   06/28/23 0445  Psych Admission Type (Psych Patients Only)  Admission Status Voluntary  Psychosocial Assessment  Patient Complaints None  Eye Contact Fair  Facial Expression Other (Comment)  Affect Appropriate to circumstance  Speech Logical/coherent  Interaction Assertive  Motor Activity Slow  Appearance/Hygiene In scrubs  Behavior Characteristics Cooperative;Appropriate to situation  Aggressive Behavior  Effect No apparent injury  Thought Process  Coherency WDL  Content WDL  Delusions None reported or observed  Perception WDL  Hallucination None reported or observed  Judgment Impaired  Confusion None  Danger to Self  Current suicidal ideation? Denies  Agreement Not to Harm Self Yes  Description of Agreement verbal  Danger to Others  Danger to Others None reported or observed

## 2023-06-29 NOTE — Care Management Important Message (Signed)
Important Message  Patient Details  Name: Peter Marsh MRN: [id number] Date of Birth: 20-May-1960   Important Message Given:  Yes - Medicare IM  CSW notes that note is entered late, however, was reviewed with the patient on 06/28/2023 at 1:00PM.   Harden Mo, LCSW 06/29/2023, 9:47 AM

## 2023-06-30 ENCOUNTER — Emergency Department (HOSPITAL_COMMUNITY): Payer: 59

## 2023-06-30 ENCOUNTER — Other Ambulatory Visit: Payer: Self-pay

## 2023-06-30 ENCOUNTER — Encounter (HOSPITAL_COMMUNITY): Payer: Self-pay

## 2023-06-30 ENCOUNTER — Observation Stay (HOSPITAL_COMMUNITY): Payer: 59

## 2023-06-30 ENCOUNTER — Inpatient Hospital Stay (HOSPITAL_COMMUNITY)
Admission: EM | Admit: 2023-06-30 | Discharge: 2023-07-02 | DRG: 389 | Disposition: A | Discharge status: Home / Self Care | Payer: 59 | Attending: Internal Medicine | Admitting: Internal Medicine

## 2023-06-30 DIAGNOSIS — N189 Chronic kidney disease, unspecified: Secondary | ICD-10-CM | POA: Diagnosis present

## 2023-06-30 DIAGNOSIS — E875 Hyperkalemia: Secondary | ICD-10-CM | POA: Diagnosis present

## 2023-06-30 DIAGNOSIS — K219 Gastro-esophageal reflux disease without esophagitis: Secondary | ICD-10-CM | POA: Diagnosis present

## 2023-06-30 DIAGNOSIS — F209 Schizophrenia, unspecified: Secondary | ICD-10-CM | POA: Diagnosis present

## 2023-06-30 DIAGNOSIS — Z87891 Personal history of nicotine dependence: Secondary | ICD-10-CM

## 2023-06-30 DIAGNOSIS — I10 Essential (primary) hypertension: Secondary | ICD-10-CM | POA: Diagnosis present

## 2023-06-30 DIAGNOSIS — Z79899 Other long term (current) drug therapy: Secondary | ICD-10-CM

## 2023-06-30 DIAGNOSIS — I252 Old myocardial infarction: Secondary | ICD-10-CM

## 2023-06-30 DIAGNOSIS — Z8249 Family history of ischemic heart disease and other diseases of the circulatory system: Secondary | ICD-10-CM

## 2023-06-30 DIAGNOSIS — J4520 Mild intermittent asthma, uncomplicated: Secondary | ICD-10-CM | POA: Diagnosis present

## 2023-06-30 DIAGNOSIS — I129 Hypertensive chronic kidney disease with stage 1 through stage 4 chronic kidney disease, or unspecified chronic kidney disease: Secondary | ICD-10-CM | POA: Diagnosis present

## 2023-06-30 DIAGNOSIS — Z820 Family history of epilepsy and other diseases of the nervous system: Secondary | ICD-10-CM

## 2023-06-30 DIAGNOSIS — K56609 Unspecified intestinal obstruction, unspecified as to partial versus complete obstruction: Principal | ICD-10-CM | POA: Diagnosis present

## 2023-06-30 DIAGNOSIS — N179 Acute kidney failure, unspecified: Secondary | ICD-10-CM | POA: Diagnosis present

## 2023-06-30 DIAGNOSIS — K861 Other chronic pancreatitis: Secondary | ICD-10-CM | POA: Diagnosis present

## 2023-06-30 DIAGNOSIS — Z950 Presence of cardiac pacemaker: Secondary | ICD-10-CM | POA: Diagnosis present

## 2023-06-30 DIAGNOSIS — Z881 Allergy status to other antibiotic agents status: Secondary | ICD-10-CM

## 2023-06-30 DIAGNOSIS — N184 Chronic kidney disease, stage 4 (severe): Secondary | ICD-10-CM | POA: Diagnosis present

## 2023-06-30 DIAGNOSIS — I480 Paroxysmal atrial fibrillation: Secondary | ICD-10-CM | POA: Diagnosis present

## 2023-06-30 DIAGNOSIS — R1114 Bilious vomiting: Secondary | ICD-10-CM | POA: Diagnosis not present

## 2023-06-30 DIAGNOSIS — J4489 Other specified chronic obstructive pulmonary disease: Secondary | ICD-10-CM | POA: Diagnosis present

## 2023-06-30 DIAGNOSIS — I447 Left bundle-branch block, unspecified: Secondary | ICD-10-CM | POA: Diagnosis present

## 2023-06-30 DIAGNOSIS — Z888 Allergy status to other drugs, medicaments and biological substances status: Secondary | ICD-10-CM

## 2023-06-30 DIAGNOSIS — N200 Calculus of kidney: Secondary | ICD-10-CM | POA: Diagnosis present

## 2023-06-30 DIAGNOSIS — Z7982 Long term (current) use of aspirin: Secondary | ICD-10-CM

## 2023-06-30 DIAGNOSIS — I441 Atrioventricular block, second degree: Secondary | ICD-10-CM | POA: Diagnosis present

## 2023-06-30 DIAGNOSIS — Z8616 Personal history of COVID-19: Secondary | ICD-10-CM

## 2023-06-30 DIAGNOSIS — E782 Mixed hyperlipidemia: Secondary | ICD-10-CM | POA: Diagnosis present

## 2023-06-30 DIAGNOSIS — Z7951 Long term (current) use of inhaled steroids: Secondary | ICD-10-CM

## 2023-06-30 DIAGNOSIS — Z87442 Personal history of urinary calculi: Secondary | ICD-10-CM

## 2023-06-30 DIAGNOSIS — I714 Abdominal aortic aneurysm, without rupture, unspecified: Secondary | ICD-10-CM | POA: Diagnosis present

## 2023-06-30 DIAGNOSIS — I4892 Unspecified atrial flutter: Secondary | ICD-10-CM | POA: Diagnosis present

## 2023-06-30 DIAGNOSIS — Z8711 Personal history of peptic ulcer disease: Secondary | ICD-10-CM

## 2023-06-30 DIAGNOSIS — R112 Nausea with vomiting, unspecified: Principal | ICD-10-CM | POA: Diagnosis present

## 2023-06-30 DIAGNOSIS — E1140 Type 2 diabetes mellitus with diabetic neuropathy, unspecified: Secondary | ICD-10-CM | POA: Diagnosis present

## 2023-06-30 DIAGNOSIS — E1122 Type 2 diabetes mellitus with diabetic chronic kidney disease: Secondary | ICD-10-CM | POA: Diagnosis present

## 2023-06-30 DIAGNOSIS — F419 Anxiety disorder, unspecified: Secondary | ICD-10-CM | POA: Diagnosis present

## 2023-06-30 DIAGNOSIS — I2510 Atherosclerotic heart disease of native coronary artery without angina pectoris: Secondary | ICD-10-CM | POA: Diagnosis present

## 2023-06-30 DIAGNOSIS — F32A Depression, unspecified: Secondary | ICD-10-CM | POA: Diagnosis present

## 2023-06-30 DIAGNOSIS — Z886 Allergy status to analgesic agent status: Secondary | ICD-10-CM

## 2023-06-30 DIAGNOSIS — Z9049 Acquired absence of other specified parts of digestive tract: Secondary | ICD-10-CM

## 2023-06-30 DIAGNOSIS — Z9861 Coronary angioplasty status: Secondary | ICD-10-CM

## 2023-06-30 DIAGNOSIS — I7143 Infrarenal abdominal aortic aneurysm, without rupture: Secondary | ICD-10-CM | POA: Diagnosis present

## 2023-06-30 LAB — URINALYSIS, ROUTINE W REFLEX MICROSCOPIC
Bacteria, UA: NONE SEEN
Bilirubin Urine: NEGATIVE
Glucose, UA: NEGATIVE mg/dL
Ketones, ur: NEGATIVE mg/dL
Leukocytes,Ua: NEGATIVE
Nitrite: NEGATIVE
Protein, ur: NEGATIVE mg/dL
Specific Gravity, Urine: 1.015 (ref 1.005–1.030)
pH: 5 (ref 5.0–8.0)

## 2023-06-30 LAB — CBC
HCT: 45.1 % (ref 39.0–52.0)
Hemoglobin: 14.9 g/dL (ref 13.0–17.0)
MCH: 28.7 pg (ref 26.0–34.0)
MCHC: 33 g/dL (ref 30.0–36.0)
MCV: 86.9 fL (ref 80.0–100.0)
Platelets: 252 10*3/uL (ref 150–400)
RBC: 5.19 MIL/uL (ref 4.22–5.81)
RDW: 12.9 % (ref 11.5–15.5)
WBC: 10 10*3/uL (ref 4.0–10.5)
nRBC: 0 % (ref 0.0–0.2)

## 2023-06-30 LAB — BASIC METABOLIC PANEL
Anion gap: 9 (ref 5–15)
BUN: 67 mg/dL — ABNORMAL HIGH (ref 8–23)
CO2: 21 mmol/L — ABNORMAL LOW (ref 22–32)
Calcium: 9 mg/dL (ref 8.9–10.3)
Chloride: 105 mmol/L (ref 98–111)
Creatinine, Ser: 2.52 mg/dL — ABNORMAL HIGH (ref 0.61–1.24)
GFR, Estimated: 28 mL/min — ABNORMAL LOW (ref >60)
Glucose, Bld: 100 mg/dL — ABNORMAL HIGH (ref 70–99)
Potassium: 5.8 mmol/L — ABNORMAL HIGH (ref 3.5–5.1)
Sodium: 135 mmol/L (ref 135–145)

## 2023-06-30 LAB — LIPASE, BLOOD: Lipase: 38 U/L (ref 11–51)

## 2023-06-30 LAB — COMPREHENSIVE METABOLIC PANEL
ALT: 30 U/L (ref 0–44)
AST: 36 U/L (ref 15–41)
Albumin: 3.9 g/dL (ref 3.5–5.0)
Alkaline Phosphatase: 75 U/L (ref 38–126)
Anion gap: 16 — ABNORMAL HIGH (ref 5–15)
BUN: 74 mg/dL — ABNORMAL HIGH (ref 8–23)
CO2: 21 mmol/L — ABNORMAL LOW (ref 22–32)
Calcium: 9.8 mg/dL (ref 8.9–10.3)
Chloride: 99 mmol/L (ref 98–111)
Creatinine, Ser: 2.84 mg/dL — ABNORMAL HIGH (ref 0.61–1.24)
GFR, Estimated: 24 mL/min — ABNORMAL LOW (ref >60)
Glucose, Bld: 116 mg/dL — ABNORMAL HIGH (ref 70–99)
Potassium: 5.3 mmol/L — ABNORMAL HIGH (ref 3.5–5.1)
Sodium: 136 mmol/L (ref 135–145)
Total Bilirubin: 1 mg/dL (ref 0.0–1.2)
Total Protein: 7.5 g/dL (ref 6.5–8.1)

## 2023-06-30 LAB — CBG MONITORING, ED: Glucose-Capillary: 109 mg/dL — ABNORMAL HIGH (ref 70–99)

## 2023-06-30 LAB — LACTIC ACID, PLASMA
Lactic Acid, Venous: 1 mmol/L (ref 0.5–1.9)
Lactic Acid, Venous: 1.2 mmol/L (ref 0.5–1.9)

## 2023-06-30 LAB — HIV ANTIBODY (ROUTINE TESTING W REFLEX): HIV Screen 4th Generation wRfx: NONREACTIVE

## 2023-06-30 MED ORDER — HYDRALAZINE HCL 20 MG/ML IJ SOLN
10.0000 mg | Freq: Four times a day (QID) | INTRAMUSCULAR | Status: AC
Start: 2023-06-30 — End: 2023-07-01
  Administered 2023-06-30 (×2): 10 mg via INTRAVENOUS
  Filled 2023-06-30 (×3): qty 1

## 2023-06-30 MED ORDER — ALUM & MAG HYDROXIDE-SIMETH 200-200-20 MG/5ML PO SUSP
30.0000 mL | Freq: Once | ORAL | Status: AC
  Administered 2023-06-30: 30 mL via ORAL
  Filled 2023-06-30: qty 30

## 2023-06-30 MED ORDER — NICOTINE 21 MG/24HR TD PT24
21.0000 mg | MEDICATED_PATCH | Freq: Every day | TRANSDERMAL | Status: DC
  Filled 2023-06-30 (×3): qty 1

## 2023-06-30 MED ORDER — PANTOPRAZOLE SODIUM 40 MG IV SOLR
40.0000 mg | Freq: Two times a day (BID) | INTRAVENOUS | Status: DC
  Administered 2023-06-30 – 2023-07-01 (×4): 40 mg via INTRAVENOUS
  Filled 2023-06-30 (×4): qty 10

## 2023-06-30 MED ORDER — SODIUM CHLORIDE 0.9 % IV SOLN: INTRAVENOUS | Status: AC

## 2023-06-30 MED ORDER — ACETAMINOPHEN 325 MG PO TABS: 650.0000 mg | ORAL_TABLET | Freq: Four times a day (QID) | ORAL | Status: DC | PRN

## 2023-06-30 MED ORDER — ACETAMINOPHEN 650 MG RE SUPP: 650.0000 mg | Freq: Four times a day (QID) | RECTAL | Status: DC | PRN

## 2023-06-30 MED ORDER — VALPROATE SODIUM 100 MG/ML IV SOLN
250.0000 mg | Freq: Two times a day (BID) | INTRAVENOUS | Status: DC
  Administered 2023-06-30 – 2023-07-01 (×4): 250 mg via INTRAVENOUS
  Filled 2023-06-30 (×10): qty 2.5

## 2023-06-30 MED ORDER — SODIUM CHLORIDE 0.9 % IV SOLN
8.0000 mg | Freq: Four times a day (QID) | INTRAVENOUS | Status: AC
  Administered 2023-06-30: 8 mg via INTRAVENOUS
  Filled 2023-06-30 (×5): qty 4

## 2023-06-30 MED ORDER — DIATRIZOATE MEGLUMINE & SODIUM 66-10 % PO SOLN
90.0000 mL | Freq: Once | ORAL | Status: AC
  Administered 2023-06-30: 90 mL via NASOGASTRIC
  Filled 2023-06-30: qty 90

## 2023-06-30 MED ORDER — ONDANSETRON HCL 4 MG/2ML IJ SOLN
4.0000 mg | Freq: Once | INTRAMUSCULAR | Status: AC | PRN
  Administered 2023-06-30: 4 mg via INTRAVENOUS
  Filled 2023-06-30: qty 2

## 2023-06-30 MED ORDER — ALBUTEROL SULFATE (2.5 MG/3ML) 0.083% IN NEBU: 2.5000 mg | INHALATION_SOLUTION | Freq: Four times a day (QID) | RESPIRATORY_TRACT | Status: DC | PRN

## 2023-06-30 MED ORDER — MORPHINE SULFATE (PF) 2 MG/ML IV SOLN: 2.0000 mg | INTRAVENOUS | Status: DC | PRN

## 2023-06-30 MED ORDER — SODIUM CHLORIDE 0.9% FLUSH
3.0000 mL | Freq: Two times a day (BID) | INTRAVENOUS | Status: DC
  Administered 2023-07-01 – 2023-07-02 (×3): 3 mL via INTRAVENOUS

## 2023-06-30 MED ORDER — SODIUM CHLORIDE 0.9 % IV BOLUS
1000.0000 mL | Freq: Once | INTRAVENOUS | Status: AC
  Administered 2023-06-30: 1000 mL via INTRAVENOUS

## 2023-06-30 MED ORDER — HEPARIN SODIUM (PORCINE) 5000 UNIT/ML IJ SOLN
5000.0000 [IU] | Freq: Three times a day (TID) | INTRAMUSCULAR | Status: DC
  Administered 2023-06-30 – 2023-07-02 (×6): 5000 [IU] via SUBCUTANEOUS
  Filled 2023-06-30 (×6): qty 1

## 2023-06-30 MED ORDER — METOPROLOL SUCCINATE ER 25 MG PO TB24
100.0000 mg | ORAL_TABLET | Freq: Every day | ORAL | Status: DC
Start: 2023-06-30 — End: 2023-06-30

## 2023-06-30 MED ORDER — CLONIDINE HCL 0.2 MG PO TABS: 0.2000 mg | ORAL_TABLET | Freq: Three times a day (TID) | ORAL | Status: DC

## 2023-06-30 MED ORDER — AMLODIPINE BESYLATE 5 MG PO TABS
5.0000 mg | ORAL_TABLET | Freq: Every day | ORAL | Status: DC
Start: 2023-06-30 — End: 2023-06-30

## 2023-06-30 NOTE — Assessment & Plan Note (Signed)
 Cont IVF hydration and we will recheck level and manage.

## 2023-06-30 NOTE — ED Provider Triage Note (Signed)
 Emergency Medicine Provider Triage Evaluation Note  Peter Marsh , a 63 y.o. male  was evaluated in triage.  Pt complains of N/V/D.  Review of Systems  Positive: As above Negative: CP SOB  Physical Exam  BP 136/79 (BP Location: Right Arm)   Pulse 78   Temp 98.3 F (36.8 C) (Oral)   Resp 18   Ht 6\' 1"  (1.854 m)   Wt 86.2 kg   SpO2 98%   BMI 25.07 kg/m  Gen:   Awake, no distress   Resp:  Normal effort  MSK:   Moves extremities without difficulty  Other:  Abdomen soft nondistended, nontender  Medical Decision Making  Medically screening exam initiated at 4:25 AM.  Appropriate orders placed.  [name] was informed that the remainder of the evaluation will be completed by another provider, this initial triage assessment does not replace that evaluation, and the importance of remaining in the ED until their evaluation is complete.  This chart was dictated using voice recognition software, Dragon. Despite the best efforts of this provider to proofread and correct errors, errors may still occur which can change documentation meaning.    Keyana Guevara R, PA-C 07/01/23 0000

## 2023-06-30 NOTE — ED Notes (Signed)
 This RN went to place 18 french NG; patient refused at this time.  PA Roxan Hockey notified and aware.

## 2023-06-30 NOTE — ED Notes (Signed)
 This RN and Chloe, RN both tried twice to get NG tube placed.  Unsuccessful twice each.  PA Roxan Hockey notified and aware.

## 2023-06-30 NOTE — ED Notes (Signed)
 COVID swab sent to lab.

## 2023-06-30 NOTE — ED Notes (Signed)
 Pt is now actively vomiting. Secure chat sent to MD regarding same.
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
## 2023-06-30 NOTE — Hospital Course (Signed)
 Patient coming in with nausea vomiting Potassium 5.3 bicarb 21 creatinine 2.84 anion gap 16 normal LFTs normal CBC Negative respiratory panel Negative urinalysis History of fecal occult positive stools last 1 in May 2024 Difficulty placing NG tube. Requested ED MD to contact general surgery for consult and will admit patient to [facility] service.

## 2023-06-30 NOTE — ED Notes (Signed)
 Immediately after taking sips of contrast PO, pt vomited 900 mL brown liquid. MD made aware via secure chat.

## 2023-06-30 NOTE — H&P (Signed)
 History and Physical    Patient: Peter Marsh YQM:[id number] DOB: [birth year] DOA: 06/30/2023 DOS: the patient was seen and examined on 06/30/2023 PCP: Estevan Oaks, NP  Patient coming from: Home Chief complaint: Chief Complaint  Patient presents with   Vomiting   HPI:  Peter Marsh is a 63 y.o. male with past medical history  of CKD stage IIIb, essential hypertension, CAD status post PCI to the LAD in January, high-grade AV block status post pacemaker  , history of gastric ulcer, paroxysmal atrial flutter, infrarenal abdominal aortic aneurysm presenting with 5 days of nausea vomiting decreased p.o. intake unable to keep anything down no reports of fever bleeding presyncopal symptoms chest pain palpitations.  Patient also reports constipation along with his abdominal pain.  He has a history of schizophrenia and is released from behavioral health 2 days ago from [facility].  ED and RN attempted NG tube placement unsuccessful.  General surgery made aware.  >>ED Course: In emergency room no distress alert awake afebrile blood pressure elevated as below. Vitals:   06/30/23 0500 06/30/23 0600 06/30/23 0738 06/30/23 1102  BP: (!) 141/80 (!) 144/83 (!) 164/90   Pulse: 64 71 65   Temp:   98 F (36.7 C) 98.1 F (36.7 C)  Resp: 18 18 17    Height:      Weight:      SpO2: 99% 97% 100%   TempSrc:   Oral Oral  BMI (Calculated):      ED evaluation  so far shows: Metabolic panel shows potassium of 5.3 anion gap gap of 16, bicarb of 21, BUN of 74, creatinine of 2.84, normal LFTs. Respiratory panel is negative for flu RSV and COVID. CBC is within normal limits. Ct abd shows acute sbo.   In the emergency room  pt has received the following treatment thus far: Medications  diatrizoate meglumine-sodium (GASTROGRAFIN) 66-10 % solution 90 mL (has no administration in time range)  heparin injection 5,000 Units (has no administration in time range)  sodium chloride flush (NS) 0.9 %  injection 3 mL (has no administration in time range)  0.9 %  sodium chloride infusion (has no administration in time range)  acetaminophen (TYLENOL) tablet 650 mg (has no administration in time range)    Or  acetaminophen (TYLENOL) suppository 650 mg (has no administration in time range)  morphine (PF) 2 MG/ML injection 2 mg (has no administration in time range)  pantoprazole (PROTONIX) injection 40 mg (has no administration in time range)  ondansetron (ZOFRAN) 8 mg in sodium chloride 0.9 % 50 mL IVPB (has no administration in time range)  nicotine (NICODERM CQ - dosed in mg/24 hours) patch 21 mg (has no administration in time range)  ondansetron (ZOFRAN) injection 4 mg (4 mg Intravenous Given 06/30/23 0330)  sodium chloride 0.9 % bolus 1,000 mL (0 mLs Intravenous Stopped 06/30/23 0630)  alum & mag hydroxide-simeth (MAALOX/MYLANTA) 200-200-20 MG/5ML suspension 30 mL (30 mLs Oral Given 06/30/23 0631)   Review of Systems  Gastrointestinal:  Positive for abdominal pain, nausea and vomiting.  All other systems reviewed and are negative.  Past Medical History:  Diagnosis Date   Allergy    Anemia    as a child   Anxiety    Asthma    uses inhalers    Bilateral carotid bruits    Cardiac conduction disorder 2018   s/p MDT PPM   Cataract    Chest pain 06/24/2018   Chronic kidney disease    bladder  interstial cystitis    Chronic kidney disease (CKD), stage IV (severe) ([name])    followed by Dr. Marisue Humble at [facility]   COPD (chronic obstructive pulmonary disease) ([name])    Coronary artery disease    COVID-19 virus infection 10/21/2020   Depression    Diabetes mellitus without complication ([name])    Discord with neighbors, lodgers and landlord 11/26/2021   Encounter for care of pacemaker 02/13/2019   GERD (gastroesophageal reflux disease)    Hematemesis 09/16/2016   History of kidney stones    History of stomach ulcers 2001   Hypertension    LBBB (left bundle branch block)    Lower  extremity edema    Mild intermittent asthma without complication    Mixed hyperlipidemia    Mobitz type 2 second degree AV block 04/06/2019   Myocardial infarction ([name])    05-25-2022  stent x1   Neuropathy    Pacemaker: Medtronic Azure XT DR MRI [id number] PPM -  BUNDLE OF HIS pacing  02/26/2017   Scheduled Remote pacemaker check  11/12/2018:  There were 24 Fast AV episodes:  EGMs show SVTs. Episodes lasted < 2 minutes. Health trends do not demonstrate significant abnormality. Battery longevity is 9.4 - 10.3 years. RA pacing is 47.1 %, RV pacing is 40.4 %.  Clinic check 11/07/17.    Paroxysmal atrial flutter ([name])    PONV (postoperative nausea and vomiting)    Prostatitis    Recurrent upper respiratory infection (URI)    Schizophrenia ([name])    Sinus node dysfunction ([name]) 02/13/2019   Urticaria    Past Surgical History:  Procedure Laterality Date   ADENOIDECTOMY     APPENDECTOMY     CHOLECYSTECTOMY N/A 11/30/2022   Procedure: LAPAROSCOPIC CHOLECYSTECTOMY WITH ICG DYE, LAPAROSCOPIC LYSIS OF ADHESIONS;  Surgeon: [name], MD;  Location: [facility];  Service: General;  Laterality: N/A;   COLONOSCOPY     CORONARY STENT INTERVENTION N/A 05/26/2022   Procedure: CORONARY STENT INTERVENTION;  Surgeon: Corky Crafts, MD;  Location: [facility] INVASIVE CV LAB;  Service: Cardiovascular;  Laterality: N/A;   CORONARY ULTRASOUND/IVUS N/A 05/26/2022   Procedure: Intravascular Ultrasound/IVUS;  Surgeon: Corky Crafts, MD;  Location: [facility] INVASIVE CV LAB;  Service: Cardiovascular;  Laterality: N/A;   ELECTROPHYSIOLOGY STUDY N/A 02/26/2017   Procedure: ELECTROPHYSIOLOGY STUDY;  Surgeon: Marinus Maw, MD;  Location: [facility] INVASIVE CV LAB;  Service: Cardiovascular;  Laterality: N/A;   ESOPHAGOGASTRODUODENOSCOPY (EGD) WITH PROPOFOL N/A 09/18/2016   Procedure: ESOPHAGOGASTRODUODENOSCOPY (EGD) WITH PROPOFOL;  Surgeon: Kerin Salen, MD;  Location: [facility] ENDOSCOPY;  Service: Gastroenterology;  Laterality: N/A;   LEFT  HEART CATH AND CORONARY ANGIOGRAPHY N/A 05/26/2022   Procedure: LEFT HEART CATH AND CORONARY ANGIOGRAPHY;  Surgeon: Corky Crafts, MD;  Location: [facility] INVASIVE CV LAB;  Service: Cardiovascular;  Laterality: N/A;   LUMBAR LAMINECTOMY/DECOMPRESSION MICRODISCECTOMY  04/19/2011   Procedure: LUMBAR LAMINECTOMY/DECOMPRESSION MICRODISCECTOMY;  Surgeon: Jacki Cones;  Location: [facility];  Service: Orthopedics;  Laterality: Left;  Hemi LAminectomy/Microdiscectomy Lumbar four  - Lumbar five  on the Left (X-Ray)   PACEMAKER IMPLANT N/A 02/26/2017   Procedure: PACEMAKER IMPLANT;  Surgeon: Marinus Maw, MD;  Location: [facility] INVASIVE CV LAB;  Service: Cardiovascular;  Laterality: N/A;   TONSILLECTOMY     UPPER GASTROINTESTINAL ENDOSCOPY      reports that he quit smoking about 15 years ago. His smoking use included cigarettes. He started smoking about 45 years ago. He has a 30 pack-year smoking history. He has never  used smokeless tobacco. He reports that he does not drink alcohol and does not use drugs.  Allergies  Allergen Reactions   Methylpyrrolidone Hives    froze the intestine   Niacin Itching, Nausea And Vomiting and Other (See Comments)    Flushing, itching, tingling    Nsaids Other (See Comments)    Other Reaction(s): CKD 4   Ace Inhibitors     Other reaction(s): CKD 4   Norvasc [Amlodipine Besylate] Other (See Comments)    Swollen Feet   Oxybutynin Chloride [Oxybutynin Chloride Er] Other (See Comments)    froze the intestine   Statins     Reports severe reaction but cannot remember exactly what it was    Ciprofloxacin Rash and Other (See Comments)    Felt flushed    Oxybutynin Rash and Other (See Comments)    bowel obst   Vesicare [Solifenacin Succinate] Rash and Other (See Comments)    Froze the intestine     Family History  Problem Relation Age of Onset   High blood pressure Mother    Alzheimer's disease Father    Heart attack Brother    Colon cancer Neg Hx    Esophageal  cancer Neg Hx    Rectal cancer Neg Hx    Stomach cancer Neg Hx     Prior to Admission medications   Medication Sig Start Date End Date Taking? Authorizing Provider  albuterol (VENTOLIN HFA) 108 (90 Base) MCG/ACT inhaler Inhale 1-2 puffs into the lungs every 6 (six) hours as needed for wheezing or shortness of breath.    [provider]  amLODipine (NORVASC) 5 MG tablet Take 1 tablet (5 mg total) by mouth daily. 11/28/22   O'NealRonnald Ramp, MD  aspirin 81 MG chewable tablet Chew 1 tablet (81 mg total) by mouth daily. 05/28/22   Osvaldo Shipper, MD  buPROPion [facility] SR) 150 MG 12 hr tablet Take 2 tablets (300 mg total) by mouth daily. 06/28/23 07/28/23  Myriam Forehand, NP  cloNIDine (CATAPRES) 0.2 MG tablet Take 1 tablet (0.2 mg total) by mouth 3 (three) times daily. 09/19/22   Sande Rives, MD  divalproex (DEPAKOTE) 250 MG DR tablet Take 1 tablet (250 mg total) by mouth every 12 (twelve) hours. 06/28/23 07/28/23  Myriam Forehand, NP  ezetimibe (ZETIA) 10 MG tablet Take 1 tablet (10 mg total) by mouth daily. 07/28/22   O'NealRonnald Ramp, MD  gabapentin (NEURONTIN) 100 MG capsule Take 1 capsule (100 mg total) by mouth 2 (two) times daily. 06/28/23 07/28/23  Myriam Forehand, NP  hydrALAZINE (APRESOLINE) 100 MG tablet Take 1 tablet (100 mg total) by mouth 3 (three) times daily. 11/28/22   O'NealRonnald Ramp, MD  isosorbide dinitrate (ISORDIL) 30 MG tablet Take 1 tablet (30 mg total) by mouth 3 (three) times daily. 06/28/23 07/28/23  Myriam Forehand, NP  metoprolol succinate (TOPROL-XL) 100 MG 24 hr tablet Take 1 tablet (100 mg total) by mouth daily. 06/29/23 07/29/23  Myriam Forehand, NP  mometasone-formoterol (DULERA) 200-5 MCG/ACT AERO Inhale 2 puffs into the lungs 2 (two) times daily. 06/28/23 07/28/23  Myriam Forehand, NP  nitroGLYCERIN (NITROSTAT) 0.4 MG SL tablet Place 0.4 mg under the tongue every 5 (five) minutes as needed for chest pain.    [provider]  ondansetron  (ZOFRAN-ODT) 4 MG disintegrating tablet Take 1 tablet (4 mg total) by mouth every 8 (eight) hours as needed for nausea or vomiting. 06/16/23   Tegeler, Canary Brim,  MD  paliperidone (INVEGA) 3 MG 24 hr tablet Take 1 tablet (3 mg total) by mouth daily. 06/20/23   Gailen Shelter, PA  pantoprazole (PROTONIX) 40 MG tablet TAKE 1 TABLET(40 MG) BY MOUTH TWICE DAILY 07/06/21   Tressia Danas, MD  potassium chloride (KLOR-CON) 20 MEQ packet Take 20 mEq by mouth 2 (two) times daily. 06/28/23 07/28/23  Myriam Forehand, NP  sucralfate (CARAFATE) 1 GM/10ML suspension Take 10 mLs (1 g total) by mouth 4 (four) times daily -  with meals and at bedtime. Patient taking differently: Take 1 g by mouth daily as needed (for ulcers). 10/25/20   Leroy Sea, MD   Vitals:   06/30/23 0500 06/30/23 0600 06/30/23 0738 06/30/23 1102  BP: (!) 141/80 (!) 144/83 (!) 164/90   Pulse: 64 71 65   Resp: 18 18 17    Temp:   98 F (36.7 C) 98.1 F (36.7 C)  TempSrc:   Oral Oral  SpO2: 99% 97% 100%   Weight:      Height:       Physical Exam Vitals and nursing note reviewed.  Constitutional:      General: He is not in acute distress. HENT:     Head: Normocephalic and atraumatic.     Right Ear: Hearing normal.     Left Ear: Hearing normal.     Nose: Nose normal. No nasal deformity.     Mouth/Throat:     Lips: Pink.     Tongue: No lesions.     Pharynx: Oropharynx is clear.  Eyes:     General: Lids are normal.     Extraocular Movements: Extraocular movements intact.  Cardiovascular:     Rate and Rhythm: Normal rate and regular rhythm.     Heart sounds: Normal heart sounds.  Pulmonary:     Effort: Pulmonary effort is normal.     Breath sounds: Normal breath sounds.  Abdominal:     General: Abdomen is protuberant. Bowel sounds are normal. There is distension.     Palpations: Abdomen is soft. There is no mass.     Tenderness: There is generalized abdominal tenderness. There is no guarding.  Musculoskeletal:      Right lower leg: No edema.     Left lower leg: No edema.  Skin:    General: Skin is warm.  Neurological:     General: No focal deficit present.     Mental Status: He is alert and oriented to person, place, and time.     Cranial Nerves: Cranial nerves 2-12 are intact.  Psychiatric:        Attention and Perception: Attention normal.        Mood and Affect: Mood normal.        Speech: Speech normal.        Behavior: Behavior normal. Behavior is cooperative.      Labs on Admission: I have personally reviewed following labs and imaging studies Results for orders placed or performed during the hospital encounter of 06/30/23 (from the past 24 hours)  Lipase, blood     Status: None   Collection Time: 06/30/23  3:19 AM  Result Value Ref Range   Lipase 38 11 - 51 U/L  Comprehensive metabolic panel     Status: Abnormal   Collection Time: 06/30/23  3:19 AM  Result Value Ref Range   Sodium 136 135 - 145 mmol/L   Potassium 5.3 (H) 3.5 - 5.1 mmol/L   Chloride 99 98 - 111  mmol/L   CO2 21 (L) 22 - 32 mmol/L   Glucose, Bld 116 (H) 70 - 99 mg/dL   BUN 74 (H) 8 - 23 mg/dL   Creatinine, Ser 1.61 (H) 0.61 - 1.24 mg/dL   Calcium 9.8 8.9 - 09.6 mg/dL   Total Protein 7.5 6.5 - 8.1 g/dL   Albumin 3.9 3.5 - 5.0 g/dL   AST 36 15 - 41 U/L   ALT 30 0 - 44 U/L   Alkaline Phosphatase 75 38 - 126 U/L   Total Bilirubin 1.0 0.0 - 1.2 mg/dL   GFR, Estimated 24 (L) >60 mL/min   Anion gap 16 (H) 5 - 15  CBC     Status: None   Collection Time: 06/30/23  3:19 AM  Result Value Ref Range   WBC 10.0 4.0 - 10.5 K/uL   RBC 5.19 4.22 - 5.81 MIL/uL   Hemoglobin 14.9 13.0 - 17.0 g/dL   HCT 04.5 40.9 - 81.1 %   MCV 86.9 80.0 - 100.0 fL   MCH 28.7 26.0 - 34.0 pg   MCHC 33.0 30.0 - 36.0 g/dL   RDW 91.4 78.2 - 95.6 %   Platelets 252 150 - 400 K/uL   nRBC 0.0 0.0 - 0.2 %  POC CBG, ED     Status: Abnormal   Collection Time: 06/30/23  4:30 AM  Result Value Ref Range   Glucose-Capillary 109 (H) 70 - 99 mg/dL   Urinalysis, Routine w reflex microscopic -Urine, Clean Catch     Status: Abnormal   Collection Time: 06/30/23  7:52 AM  Result Value Ref Range   Color, Urine YELLOW YELLOW   APPearance CLEAR CLEAR   Specific Gravity, Urine 1.015 1.005 - 1.030   pH 5.0 5.0 - 8.0   Glucose, UA NEGATIVE NEGATIVE mg/dL   Hgb urine dipstick SMALL (A) NEGATIVE   Bilirubin Urine NEGATIVE NEGATIVE   Ketones, ur NEGATIVE NEGATIVE mg/dL   Protein, ur NEGATIVE NEGATIVE mg/dL   Nitrite NEGATIVE NEGATIVE   Leukocytes,Ua NEGATIVE NEGATIVE   RBC / HPF 0-5 0 - 5 RBC/hpf   WBC, UA 0-5 0 - 5 WBC/hpf   Bacteria, UA NONE SEEN NONE SEEN   Squamous Epithelial / HPF 0-5 0 - 5 /HPF   Recent Results (from the past 720 hours)  Urine Culture     Status: Abnormal   Collection Time: 06/16/23  5:00 PM   Specimen: Urine, Clean Catch  Result Value Ref Range Status   Specimen Description   Final    URINE, CLEAN CATCH Performed at [facility], [address] [place], [place] [address]    Special Requests   Final    NONE Performed at [facility], [address], [place], [place] [address]    Culture (A)  Final    <10,000 COLONIES/mL INSIGNIFICANT GROWTH Performed at [facility], [address], [place], [place] [address]    Report Status 06/18/2023 FINAL  Final  Blood culture (routine x 2)     Status: None   Collection Time: 06/16/23  5:46 PM   Specimen: BLOOD  Result Value Ref Range Status   Specimen Description   Final    BLOOD LEFT ANTECUBITAL Performed at [facility], [address] [place], [place] [address]    Special Requests   Final    BOTTLES DRAWN AEROBIC AND ANAEROBIC Blood Culture results may not be optimal due to an inadequate volume of blood received in  culture bottles Performed at [facility], [address] [place], [place] [address]    Culture   Final    NO GROWTH 5 DAYS Performed at [facility],  [address], [place], [place] [address]    Report Status 06/22/2023 FINAL  Final  Resp panel by RT-PCR (RSV, Flu A&B, Covid) Anterior Nasal Swab     Status: None   Collection Time: 06/16/23  6:07 PM   Specimen: Anterior Nasal Swab  Result Value Ref Range Status   SARS Coronavirus 2 by RT PCR NEGATIVE NEGATIVE Final    Comment: (NOTE) SARS-CoV-2 target nucleic acids are NOT DETECTED.  The SARS-CoV-2 RNA is generally detectable in upper respiratory specimens during the acute phase of infection. The lowest concentration of SARS-CoV-2 viral copies this assay can detect is 138 copies/mL. A negative result does not preclude SARS-Cov-2 infection and should not be used as the sole basis for treatment or other patient management decisions. A negative result may occur with  improper specimen collection/handling, submission of specimen other than nasopharyngeal swab, presence of viral mutation(s) within the areas targeted by this assay, and inadequate number of viral copies(<138 copies/mL). A negative result must be combined with clinical observations, patient history, and epidemiological information. The expected result is Negative.  Fact Sheet for Patients:  BloggerCourse.com  Fact Sheet for Healthcare Providers:  SeriousBroker.it  This test is no t yet approved or cleared by the [place] FDA and  has been authorized for detection and/or diagnosis of SARS-CoV-2 by FDA under an Emergency Use Authorization (EUA). This EUA will remain  in effect (meaning this test can be used) for the duration of the COVID-19 declaration under Section 564(b)(1) of the Act, 21 U.S.C.section 360bbb-3(b)(1), unless the authorization is terminated  or revoked sooner.       Influenza A by PCR NEGATIVE NEGATIVE Final   Influenza B by PCR NEGATIVE NEGATIVE Final    Comment: (NOTE) The Xpert Xpress SARS-CoV-2/FLU/RSV plus assay is intended as an aid in the  diagnosis of influenza from Nasopharyngeal swab specimens and should not be used as a sole basis for treatment. Nasal washings and aspirates are unacceptable for Xpert Xpress SARS-CoV-2/FLU/RSV testing.  Fact Sheet for Patients: BloggerCourse.com  Fact Sheet for Healthcare Providers: SeriousBroker.it  This test is not yet approved or cleared by the [place] FDA and has been authorized for detection and/or diagnosis of SARS-CoV-2 by FDA under an Emergency Use Authorization (EUA). This EUA will remain in effect (meaning this test can be used) for the duration of the COVID-19 declaration under Section 564(b)(1) of the Act, 21 U.S.C. section 360bbb-3(b)(1), unless the authorization is terminated or revoked.     Resp Syncytial Virus by PCR NEGATIVE NEGATIVE Final    Comment: (NOTE) Fact Sheet for Patients: BloggerCourse.com  Fact Sheet for Healthcare Providers: SeriousBroker.it  This test is not yet approved or cleared by the [place] FDA and has been authorized for detection and/or diagnosis of SARS-CoV-2 by FDA under an Emergency Use Authorization (EUA). This EUA will remain in effect (meaning this test can be used) for the duration of the COVID-19 declaration under Section 564(b)(1) of the Act, 21 U.S.C. section 360bbb-3(b)(1), unless the authorization is terminated or revoked.  Performed at [facility], [address] [place], [place] [address]    CBC:    Latest Ref Rng & Units 06/30/2023    3:19 AM 06/20/2023    7:46 AM 06/16/2023    5:46  PM  CBC  WBC 4.0 - 10.5 K/uL 10.0  6.9  6.0   Hemoglobin 13.0 - 17.0 g/dL 16.1  09.6  04.5   Hematocrit 39.0 - 52.0 % 45.1  41.6  38.9   Platelets 150 - 400 K/uL 252  205  191    Basic Metabolic Panel: Recent Labs  Lab 06/30/23 0319  NA 136  K 5.3*  CL 99  CO2 21*  GLUCOSE 116*  BUN 74*  CREATININE  2.84*  CALCIUM 9.8   Creatinine: Lab Results  Component Value Date   CREATININE 2.84 (H) 06/30/2023   CREATININE 1.82 (H) 06/20/2023   CREATININE 2.57 (H) 06/16/2023   Liver Function Tests:    Latest Ref Rng & Units 06/30/2023    3:19 AM 06/20/2023    7:46 AM 06/16/2023    5:46 PM  Hepatic Function  Total Protein 6.5 - 8.1 g/dL 7.5  6.6  7.1   Albumin 3.5 - 5.0 g/dL 3.9  3.9  4.2   AST 15 - 41 U/L 36  31  25   ALT 0 - 44 U/L 30  21  15    Alk Phosphatase 38 - 126 U/L 75  59  65   Total Bilirubin 0.0 - 1.2 mg/dL 1.0  0.8  1.2    Coagulation Profile: No results for input(s): "INR", "PROTIME" in the last 168 hours. Cardiac Enzymes: No results for input(s): "CKTOTAL", "CKMB", "CKMBINDEX", "TROPONINI" in the last 168 hours. BNP (last 3 results) No results for input(s): "PROBNP" in the last 8760 hours. HbA1C: No results for input(s): "HGBA1C" in the last 72 hours. Lipid Profile: No results for input(s): "CHOL", "HDL", "LDLCALC", "TRIG", "CHOLHDL", "LDLDIRECT" in the last 72 hours.  Radiological Exams on Admission: CT ABDOMEN PELVIS WO CONTRAST Result Date: 06/30/2023 CLINICAL DATA:  63 year old male with abdominal pain and vomiting. Chronic nephrolithiasis, right UPJ stenosis. EXAM: CT ABDOMEN AND PELVIS WITHOUT CONTRAST TECHNIQUE: Multidetector CT imaging of the abdomen and pelvis was performed following the standard protocol without IV contrast. RADIATION DOSE REDUCTION: This exam was performed according to the departmental dose-optimization program which includes automated exposure control, adjustment of the mA and/or kV according to patient size and/or use of iterative reconstruction technique. COMPARISON:  CT Abdomen and Pelvis 06/16/2023 and earlier. FINDINGS: Lower chest: Normal heart size. No pericardial effusion. Lung bases are stable, essentially clear. Hepatobiliary: Chronic cholecystectomy.  Negative noncontrast liver. Pancreas: Stable chronic calcified pancreatitis sequelae.  Spleen: Negative. Adrenals/Urinary Tract: Normal adrenal glands. Stable kidneys. Bilateral nephrolithiasis. Chronic right UPJ stenosis. Chronic exophytic simple fluid density renal cysts (no follow-up imaging recommended). Both ureters are decompressed. Unremarkable bladder. Stomach/Bowel: Large bowel redundancy, retained gas and stool. The sigmoid colon is located subjacent to the right hemidiaphragm along with the hepatic flexure. Volume of retained stool is moderate and increased from earlier this month. No large bowel inflammation. Diminutive or absent appendix. Nondilated terminal ileum. Fluid distended stomach and duodenum, but tapered ligament of Treitz. Fluid containing proximal small bowel loops are at the upper limits of normal to mildly dilated, with a stacked appearance of the loops in the anterior abdomen. There is a relatively abrupt transition point along the right anterior abdominal wall above the umbilicus on series 3, image 28, but no obstructing etiology or inflammation in the region. No free air or free fluid. Vascular/Lymphatic: Chronic infrarenal abdominal aortic aneurysm is stable measuring 38-39 mm diameter (series 3, image 48). Underlying Calcified aortic atherosclerosis. Vascular patency is not evaluated in the absence of  IV contrast. No lymphadenopathy. Reproductive: Negative. Other: No pelvis free fluid. Musculoskeletal: Chronically advanced lower lumbar spine degeneration. Hyperostosis in the thoracic spine with interbody ankylosis. No acute osseous abnormality identified. IMPRESSION: 1. Appearance compatible with Acute Small Bowel Obstruction, with a relatively abrupt distal small bowel transition point in the anterior right abdomen on series 3, image 28. No obstructing etiology identified. No free air or free fluid. 2. Increased retained stool in redundant large bowel since earlier this month. 3. Otherwise stable CT appearance of the CT Abdomen and Pelvis including: - Abdominal Aortic  Aneurysm measuring 3.9 cm. Recommend surveillance ultrasound in 3 years. Reference: Journal of Vascular Surgery 67.1 (2018): 2-77. J Am Coll Radiol [phone]. Aortic aneurysm NOS (ICD10-I71.9). Aortic Atherosclerosis (ICD10-I70.0). - Bilateral nephrolithiasis. Chronic right UPJ stenosis. - Chronic calcific pancreatitis. Electronically Signed   By: Odessa Fleming M.D.   On: 06/30/2023 08:12    Data Reviewed: Relevant notes from primary care and specialist visits, past discharge summaries as available in EHR, including Care Everywhere. Prior diagnostic testing as pertinent to current admission diagnoses, Updated medications and problem lists for reconciliation ED course, including vitals, labs, imaging, treatment and response to treatment,Triage notes, nursing and pharmacy notes and ED provider's notes Notable results as noted in HPI.Discussed case with EDMD/ ED APP/ or Specialty MD on call and as needed.  Assessment and Plan:  >>Nausea/ vomiting/ abdominal pain/ SBO: -2/2 SBO. -IV PPI/ NPO / ngt per gen surg.  -appreciate general surgery consult.   >>Htn: Vitals:   06/30/23 0314 06/30/23 0500 06/30/23 0600 06/30/23 0738  BP: 136/79 (!) 141/80 (!) 144/83 (!) 164/90  P.o. meds held currently and patient scheduled on IV hydralazine every 6 hours.  >>Asthma/ COPD: Stable on exam. Prn albuterol.   >>AKI on CKD: Gentle IVF hydration.  NPO.  Renally dose meds and avoid contrast.    >>PAF/ Medtronic pacemaker: Interrogate as needed. Pt is currently sinus.   >>Coronary Artery Disease (CAD) Status post PCI to LAD in January 2024 with recovered ejection fraction.  No reported chest pain or breathing difficulties. Aspirin currently held.   DVT prophylaxis:  Heparin  Consults:  General surgery:kelly osborne.  Advance Care Planning:    Code Status: Full Code   Family Communication:  None  Disposition Plan:  Home  Severity of Illness: The appropriate patient status for this  patient is OBSERVATION. Observation status is judged to be reasonable and necessary in order to provide the required intensity of service to ensure the patient's safety. The patient's presenting symptoms, physical exam findings, and initial radiographic and laboratory data in the context of their medical condition is felt to place them at decreased risk for further clinical deterioration. Furthermore, it is anticipated that the patient will be medically stable for discharge from the hospital within 2 midnights of admission.   Author: Gertha Calkin, MD 06/30/2023 11:34 AM  For on call review [email or web address].   Unresulted Labs (From admission, onward)     Start     Ordered   07/01/23 0500  Comprehensive metabolic panel  Tomorrow morning,   R        06/30/23 1022   07/01/23 0500  CBC  Tomorrow morning,   R        06/30/23 1022   06/30/23 1135  Basic metabolic panel  ONCE - STAT,   R        06/30/23 1134   06/30/23 1122  Lactic acid, plasma  (Lactic Acid)  STAT Now then every  3 hours,   R      06/30/23 1121   06/30/23 1020  HIV Antibody (routine testing w rflx)  (HIV Antibody (Routine testing w reflex) panel)  Once,   R        06/30/23 1022            Orders Placed This Encounter  Procedures   CT ABDOMEN PELVIS WO CONTRAST   DG Abd Portable 1V-Small Bowel Protocol-Position Verification   DG Abd Portable 1V-Small Bowel Obstruction Protocol-initial, 8 hr delay   Lipase, blood   Comprehensive metabolic panel   CBC   Urinalysis, Routine w reflex microscopic -Urine, Clean Catch   HIV Antibody (routine testing w rflx)   Comprehensive metabolic panel   CBC   Lactic acid, plasma   Basic metabolic panel   Diet NPO time specified Except for: Ice Chips   Saline Lock IV, Maintain IV access (when placed in a treatment room)   Notify physician (specific to NG/OG)   NGT - Nasogastric Tube; Insert tube, Maintain tube in place   Flush NG/OG   Order port ABD x-ray ([id number]) to confirm tube  placement prior to usage.   Refer to Sidebar: Small Bowel Obstruction Protocol   Gastric tube   Elevate head of bed   Ensure patient not vomiting prior to administration of Gastrografin. Notify MD if patient is actively vomiting   Clamp NG tube for 1 hour after administration of Gastrografin then resume NG tube to low wall intermittent suction   Maintain IV access   Vital signs   Notify physician (specify)   Mobility Protocol: No Restrictions RN to initiate protocols based on patient's level of care   Refer to Sidebar Report Refer to ICU, Med-Surg, Progressive, and Step-Down Mobility Protocol Sidebars   Initiate Adult Central Line Maintenance and Catheter Protocol for patients with central line (CVC, PICC, Port, Hemodialysis, Trialysis)   Daily weights   Intake and Output   Do not place and if present remove PureWick   Initiate Oral Care Protocol   Initiate Carrier Fluid Protocol   RN may order General Admission PRN Orders utilizing "General Admission PRN medications" (through manage orders) for the following patient needs: allergy symptoms (Claritin), cold sores (Carmex), cough (Robitussin DM), eye irritation (Liquifilm Tears), hemorrhoids (Tucks), indigestion (Maalox), Peter skin irritation (Hydrocortisone Cream), muscle pain Romeo Apple Gay), nose irritation (saline nasal spray) and sore throat (Chloraseptic spray).   Cardiac Monitoring - Continuous Indefinite   Measure blood pressure   Full code   Consult to general surgery   Consult to hospitalist   Pulse oximetry check with vital signs   Oxygen therapy Mode or (Route): Nasal cannula; Liters Per Minute: 2; Keep O2 saturation between: greater than 92 %   POC CBG, ED   EKG 12-Lead   Place in observation (patient's expected length of stay will be less than 2 midnights)   Aspiration precautions   Fall precautions

## 2023-06-30 NOTE — ED Triage Notes (Signed)
 Pt coming from home via EMS for vomiting x 5 days, poor PO intake, unable to keep anything down. Pt awake and alert, NAD noted Denies fever. Hx of AAA, pacemaker, MI. Seen at [place] 1 week ago for same. CBG 187.

## 2023-06-30 NOTE — ED Triage Notes (Signed)
 Pt also reports generalized abdominal pain, distention.

## 2023-06-30 NOTE — Consult Note (Addendum)
 Reason for Consult:SBO Referring Physician: Arwin Bisceglia is an 63 y.o. male.  HPI: 63 year old male with past medical history of coronary artery disease, pacemaker in place, schizophrenia, and other issues as listed below is known to our practice status post laparoscopic cholecystectomy and laparoscopic lysis of adhesions by Dr. Andrey Campanile 7/24 presents to the emergency department with a 2-week history of nausea and vomiting.  This is associated with crampy abdominal pain.  He reports 2 previous episodes where his "ileum froze up" due to a medication reaction.  He is no longer on that medication but does also describe another small bowel obstruction managed nonoperatively in the past.  White blood cell count is 10,000.  CT scan of the abdomen and pelvis shows small bowel obstruction as well as increased stool burden.  We are consulted for surgical management.  Of note, nursing initially tried to place a small caliber nasogastric tube and were unsuccessful.  He has refused placement of a larger NG tube.  Past Medical History:  Diagnosis Date   Allergy    Anemia    as a child   Anxiety    Asthma    uses inhalers    Bilateral carotid bruits    Cardiac conduction disorder 2018   s/p MDT PPM   Cataract    Chest pain 06/24/2018   Chronic kidney disease    bladder interstial cystitis    Chronic kidney disease (CKD), stage IV (severe) ([name])    followed by Dr. Marisue Humble at [facility]   COPD (chronic obstructive pulmonary disease) ([name])    Coronary artery disease    COVID-19 virus infection 10/21/2020   Depression    Diabetes mellitus without complication ([name])    Discord with neighbors, lodgers and landlord 11/26/2021   Encounter for care of pacemaker 02/13/2019   GERD (gastroesophageal reflux disease)    Hematemesis 09/16/2016   History of kidney stones    History of stomach ulcers 2001   Hypertension    LBBB (left bundle branch block)    Lower extremity edema    Mild  intermittent asthma without complication    Mixed hyperlipidemia    Mobitz type 2 second degree AV block 04/06/2019   Myocardial infarction ([name])    05-25-2022  stent x1   Neuropathy    Pacemaker: Medtronic Azure XT DR MRI [id number] PPM -  BUNDLE OF HIS pacing  02/26/2017   Scheduled Remote pacemaker check  11/12/2018:  There were 24 Fast AV episodes:  EGMs show SVTs. Episodes lasted < 2 minutes. Health trends do not demonstrate significant abnormality. Battery longevity is 9.4 - 10.3 years. RA pacing is 47.1 %, RV pacing is 40.4 %.  Clinic check 11/07/17.    Paroxysmal atrial flutter ([name])    PONV (postoperative nausea and vomiting)    Prostatitis    Recurrent upper respiratory infection (URI)    Schizophrenia ([name])    Sinus node dysfunction ([name]) 02/13/2019   Urticaria     Past Surgical History:  Procedure Laterality Date   ADENOIDECTOMY     APPENDECTOMY     CHOLECYSTECTOMY N/A 11/30/2022   Procedure: LAPAROSCOPIC CHOLECYSTECTOMY WITH ICG DYE, LAPAROSCOPIC LYSIS OF ADHESIONS;  Surgeon: [name], MD;  Location: [facility] ORS;  Service: General;  Laterality: N/A;   COLONOSCOPY     CORONARY STENT INTERVENTION N/A 05/26/2022   Procedure: CORONARY STENT INTERVENTION;  Surgeon: Corky Crafts, MD;  Location: [facility] INVASIVE CV LAB;  Service: Cardiovascular;  Laterality: N/A;  CORONARY ULTRASOUND/IVUS N/A 05/26/2022   Procedure: Intravascular Ultrasound/IVUS;  Surgeon: Corky Crafts, MD;  Location: [facility] INVASIVE CV LAB;  Service: Cardiovascular;  Laterality: N/A;   ELECTROPHYSIOLOGY STUDY N/A 02/26/2017   Procedure: ELECTROPHYSIOLOGY STUDY;  Surgeon: Marinus Maw, MD;  Location: [facility] INVASIVE CV LAB;  Service: Cardiovascular;  Laterality: N/A;   ESOPHAGOGASTRODUODENOSCOPY (EGD) WITH PROPOFOL N/A 09/18/2016   Procedure: ESOPHAGOGASTRODUODENOSCOPY (EGD) WITH PROPOFOL;  Surgeon: Kerin Salen, MD;  Location: [facility] ENDOSCOPY;  Service: Gastroenterology;  Laterality: N/A;   LEFT HEART CATH AND CORONARY  ANGIOGRAPHY N/A 05/26/2022   Procedure: LEFT HEART CATH AND CORONARY ANGIOGRAPHY;  Surgeon: Corky Crafts, MD;  Location: [facility] INVASIVE CV LAB;  Service: Cardiovascular;  Laterality: N/A;   LUMBAR LAMINECTOMY/DECOMPRESSION MICRODISCECTOMY  04/19/2011   Procedure: LUMBAR LAMINECTOMY/DECOMPRESSION MICRODISCECTOMY;  Surgeon: Jacki Cones;  Location: [facility] ORS;  Service: Orthopedics;  Laterality: Left;  Hemi LAminectomy/Microdiscectomy Lumbar four  - Lumbar five  on the Left (X-Ray)   PACEMAKER IMPLANT N/A 02/26/2017   Procedure: PACEMAKER IMPLANT;  Surgeon: Marinus Maw, MD;  Location: [facility] INVASIVE CV LAB;  Service: Cardiovascular;  Laterality: N/A;   TONSILLECTOMY     UPPER GASTROINTESTINAL ENDOSCOPY      Family History  Problem Relation Age of Onset   High blood pressure Mother    Alzheimer's disease Father    Heart attack Brother    Colon cancer Neg Hx    Esophageal cancer Neg Hx    Rectal cancer Neg Hx    Stomach cancer Neg Hx     Social History:  reports that he quit smoking about 15 years ago. His smoking use included cigarettes. He started smoking about 45 years ago. He has a 30 pack-year smoking history. He has never used smokeless tobacco. He reports that he does not drink alcohol and does not use drugs.  Allergies:  Allergies  Allergen Reactions   Methylpyrrolidone Hives    froze the intestine   Niacin Itching, Nausea And Vomiting and Other (See Comments)    Flushing, itching, tingling    Nsaids Other (See Comments)    Other Reaction(s): CKD 4   Ace Inhibitors     Other reaction(s): CKD 4   Norvasc [Amlodipine Besylate] Other (See Comments)    Swollen Feet   Oxybutynin Chloride [Oxybutynin Chloride Er] Other (See Comments)    froze the intestine   Statins     Reports severe reaction but cannot remember exactly what it was    Ciprofloxacin Rash and Other (See Comments)    Felt flushed    Oxybutynin Rash and Other (See Comments)    bowel obst   Vesicare  [Solifenacin Succinate] Rash and Other (See Comments)    Froze the intestine     Medications: I have reviewed the patient's current medications.  Results for orders placed or performed during the hospital encounter of 06/30/23 (from the past 48 hours)  Lipase, blood     Status: None   Collection Time: 06/30/23  3:19 AM  Result Value Ref Range   Lipase 38 11 - 51 U/L    Comment: Performed at [facility], [address], [place], [place] [address]  Comprehensive metabolic panel     Status: Abnormal   Collection Time: 06/30/23  3:19 AM  Result Value Ref Range   Sodium 136 135 - 145 mmol/L   Potassium 5.3 (H) 3.5 - 5.1 mmol/L   Chloride 99 98 - 111 mmol/L   CO2 21 (L) 22 -  32 mmol/L   Glucose, Bld 116 (H) 70 - 99 mg/dL    Comment: Glucose reference range applies only to samples taken after fasting for at least 8 hours.   BUN 74 (H) 8 - 23 mg/dL   Creatinine, Ser 9.14 (H) 0.61 - 1.24 mg/dL   Calcium 9.8 8.9 - 78.2 mg/dL   Total Protein 7.5 6.5 - 8.1 g/dL   Albumin 3.9 3.5 - 5.0 g/dL   AST 36 15 - 41 U/L   ALT 30 0 - 44 U/L   Alkaline Phosphatase 75 38 - 126 U/L   Total Bilirubin 1.0 0.0 - 1.2 mg/dL   GFR, Estimated 24 (L) >60 mL/min    Comment: (NOTE) Calculated using the CKD-EPI Creatinine Equation (2021)    Anion gap 16 (H) 5 - 15    Comment: Performed at [facility], [address], [place], [place] [address]  CBC     Status: None   Collection Time: 06/30/23  3:19 AM  Result Value Ref Range   WBC 10.0 4.0 - 10.5 K/uL   RBC 5.19 4.22 - 5.81 MIL/uL   Hemoglobin 14.9 13.0 - 17.0 g/dL   HCT 30.8 65.7 - 84.6 %   MCV 86.9 80.0 - 100.0 fL   MCH 28.7 26.0 - 34.0 pg   MCHC 33.0 30.0 - 36.0 g/dL   RDW 96.2 95.2 - 84.1 %   Platelets 252 150 - 400 K/uL   nRBC 0.0 0.0 - 0.2 %    Comment: Performed at [facility], [address], [place], [place] [address]  POC CBG, ED     Status: Abnormal   Collection Time: 06/30/23  4:30 AM  Result Value Ref Range    Glucose-Capillary 109 (H) 70 - 99 mg/dL    Comment: Glucose reference range applies only to samples taken after fasting for at least 8 hours.  Urinalysis, Routine w reflex microscopic -Urine, Clean Catch     Status: Abnormal   Collection Time: 06/30/23  7:52 AM  Result Value Ref Range   Color, Urine YELLOW YELLOW   APPearance CLEAR CLEAR   Specific Gravity, Urine 1.015 1.005 - 1.030   pH 5.0 5.0 - 8.0   Glucose, UA NEGATIVE NEGATIVE mg/dL   Hgb urine dipstick SMALL (A) NEGATIVE   Bilirubin Urine NEGATIVE NEGATIVE   Ketones, ur NEGATIVE NEGATIVE mg/dL   Protein, ur NEGATIVE NEGATIVE mg/dL   Nitrite NEGATIVE NEGATIVE   Leukocytes,Ua NEGATIVE NEGATIVE   RBC / HPF 0-5 0 - 5 RBC/hpf   WBC, UA 0-5 0 - 5 WBC/hpf   Bacteria, UA NONE SEEN NONE SEEN   Squamous Epithelial / HPF 0-5 0 - 5 /HPF    Comment: Performed at [facility], [address] [place], [place] [address]    CT ABDOMEN PELVIS WO CONTRAST Result Date: 06/30/2023 CLINICAL DATA:  63 year old male with abdominal pain and vomiting. Chronic nephrolithiasis, right UPJ stenosis. EXAM: CT ABDOMEN AND PELVIS WITHOUT CONTRAST TECHNIQUE: Multidetector CT imaging of the abdomen and pelvis was performed following the standard protocol without IV contrast. RADIATION DOSE REDUCTION: This exam was performed according to the departmental dose-optimization program which includes automated exposure control, adjustment of the mA and/or kV according to patient size and/or use of iterative reconstruction technique. COMPARISON:  CT Abdomen and Pelvis 06/16/2023 and earlier. FINDINGS: Lower chest: Normal heart size. No pericardial effusion. Lung bases are stable, essentially clear. Hepatobiliary: Chronic cholecystectomy.  Negative noncontrast liver. Pancreas: Stable chronic  calcified pancreatitis sequelae. Spleen: Negative. Adrenals/Urinary Tract: Normal adrenal glands. Stable kidneys. Bilateral nephrolithiasis. Chronic right UPJ stenosis. Chronic  exophytic simple fluid density renal cysts (no follow-up imaging recommended). Both ureters are decompressed. Unremarkable bladder. Stomach/Bowel: Large bowel redundancy, retained gas and stool. The sigmoid colon is located subjacent to the right hemidiaphragm along with the hepatic flexure. Volume of retained stool is moderate and increased from earlier this month. No large bowel inflammation. Diminutive or absent appendix. Nondilated terminal ileum. Fluid distended stomach and duodenum, but tapered ligament of Treitz. Fluid containing proximal small bowel loops are at the upper limits of normal to mildly dilated, with a stacked appearance of the loops in the anterior abdomen. There is a relatively abrupt transition point along the right anterior abdominal wall above the umbilicus on series 3, image 28, but no obstructing etiology or inflammation in the region. No free air or free fluid. Vascular/Lymphatic: Chronic infrarenal abdominal aortic aneurysm is stable measuring 38-39 mm diameter (series 3, image 48). Underlying Calcified aortic atherosclerosis. Vascular patency is not evaluated in the absence of IV contrast. No lymphadenopathy. Reproductive: Negative. Other: No pelvis free fluid. Musculoskeletal: Chronically advanced lower lumbar spine degeneration. Hyperostosis in the thoracic spine with interbody ankylosis. No acute osseous abnormality identified. IMPRESSION: 1. Appearance compatible with Acute Small Bowel Obstruction, with a relatively abrupt distal small bowel transition point in the anterior right abdomen on series 3, image 28. No obstructing etiology identified. No free air or free fluid. 2. Increased retained stool in redundant large bowel since earlier this month. 3. Otherwise stable CT appearance of the CT Abdomen and Pelvis including: - Abdominal Aortic Aneurysm measuring 3.9 cm. Recommend surveillance ultrasound in 3 years. Reference: Journal of Vascular Surgery 67.1 (2018): 2-77. J Am Coll  Radiol [phone]. Aortic aneurysm NOS (ICD10-I71.9). Aortic Atherosclerosis (ICD10-I70.0). - Bilateral nephrolithiasis. Chronic right UPJ stenosis. - Chronic calcific pancreatitis. Electronically Signed   By: Odessa Fleming M.D.   On: 06/30/2023 08:12    Review of Systems  Constitutional:  Positive for appetite change.  HENT: Negative.    Eyes: Negative.   Respiratory:  Negative for chest tightness.   Cardiovascular:  Positive for chest pain.  Gastrointestinal:  Positive for abdominal pain, nausea and vomiting.  Endocrine: Negative.   Genitourinary:  Positive for urgency.  Musculoskeletal: Negative.   Skin: Negative.   Allergic/Immunologic: Negative.   Neurological: Negative.   Hematological: Negative.   Psychiatric/Behavioral: Negative.     Blood pressure (!) 164/90, pulse 65, temperature 98.1 F (36.7 C), temperature source Oral, resp. rate 17, height 6\' 1"  (1.854 m), weight 86.2 kg, SpO2 100%. Physical Exam HENT:     Head: Normocephalic.     Mouth/Throat:     Mouth: Mucous membranes are dry.  Cardiovascular:     Comments: Paced rhythm Pulmonary:     Effort: Pulmonary effort is normal.     Breath sounds: Normal breath sounds. No wheezing.  Abdominal:     General: There is distension.     Tenderness: There is abdominal tenderness. There is no guarding or rebound.     Comments: Some abdominal distention with very mild tenderness.  No peritonitis.  Surgical scars well-healed.  Skin:    General: Skin is warm.  Neurological:     Mental Status: He is alert and oriented to person, place, and time.  Psychiatric:        Mood and Affect: Mood normal.     Assessment/Plan: Small bowel obstruction -agree with medical admission.  Recommend IV  fluids, nasogastric tube decompression and small bowel obstruction protocol.  Patient is currently refusing nasogastric tube placement.  We will try the protocol with oral contrast.  He does not need emergent surgery but we will follow closely  with you.  Liz Malady 06/30/2023, 11:48 AM

## 2023-06-30 NOTE — Progress Notes (Signed)
 Patient has arrived to the unit via stretcher from ED. A/O x 4. No pain noted at this time. Oriented patient to the room and staff. Education provided regarding safety precaution and patient verbalize understating.

## 2023-07-01 ENCOUNTER — Observation Stay (HOSPITAL_COMMUNITY): Payer: 59

## 2023-07-01 DIAGNOSIS — N179 Acute kidney failure, unspecified: Secondary | ICD-10-CM | POA: Diagnosis present

## 2023-07-01 DIAGNOSIS — I129 Hypertensive chronic kidney disease with stage 1 through stage 4 chronic kidney disease, or unspecified chronic kidney disease: Secondary | ICD-10-CM | POA: Diagnosis present

## 2023-07-01 DIAGNOSIS — K56609 Unspecified intestinal obstruction, unspecified as to partial versus complete obstruction: Secondary | ICD-10-CM | POA: Diagnosis present

## 2023-07-01 DIAGNOSIS — Z7951 Long term (current) use of inhaled steroids: Secondary | ICD-10-CM | POA: Diagnosis not present

## 2023-07-01 DIAGNOSIS — Z87891 Personal history of nicotine dependence: Secondary | ICD-10-CM | POA: Diagnosis not present

## 2023-07-01 DIAGNOSIS — R112 Nausea with vomiting, unspecified: Secondary | ICD-10-CM | POA: Diagnosis not present

## 2023-07-01 DIAGNOSIS — E1140 Type 2 diabetes mellitus with diabetic neuropathy, unspecified: Secondary | ICD-10-CM | POA: Diagnosis present

## 2023-07-01 DIAGNOSIS — I714 Abdominal aortic aneurysm, without rupture, unspecified: Secondary | ICD-10-CM | POA: Diagnosis present

## 2023-07-01 DIAGNOSIS — Z881 Allergy status to other antibiotic agents status: Secondary | ICD-10-CM | POA: Diagnosis not present

## 2023-07-01 DIAGNOSIS — J4520 Mild intermittent asthma, uncomplicated: Secondary | ICD-10-CM | POA: Diagnosis present

## 2023-07-01 DIAGNOSIS — N184 Chronic kidney disease, stage 4 (severe): Secondary | ICD-10-CM | POA: Diagnosis present

## 2023-07-01 DIAGNOSIS — Z79899 Other long term (current) drug therapy: Secondary | ICD-10-CM | POA: Diagnosis not present

## 2023-07-01 DIAGNOSIS — F209 Schizophrenia, unspecified: Secondary | ICD-10-CM | POA: Diagnosis present

## 2023-07-01 DIAGNOSIS — Z8249 Family history of ischemic heart disease and other diseases of the circulatory system: Secondary | ICD-10-CM | POA: Diagnosis not present

## 2023-07-01 DIAGNOSIS — I2510 Atherosclerotic heart disease of native coronary artery without angina pectoris: Secondary | ICD-10-CM | POA: Diagnosis present

## 2023-07-01 DIAGNOSIS — K861 Other chronic pancreatitis: Secondary | ICD-10-CM | POA: Diagnosis present

## 2023-07-01 DIAGNOSIS — I441 Atrioventricular block, second degree: Secondary | ICD-10-CM | POA: Diagnosis present

## 2023-07-01 DIAGNOSIS — Z8616 Personal history of COVID-19: Secondary | ICD-10-CM | POA: Diagnosis not present

## 2023-07-01 DIAGNOSIS — Z888 Allergy status to other drugs, medicaments and biological substances status: Secondary | ICD-10-CM | POA: Diagnosis not present

## 2023-07-01 DIAGNOSIS — F32A Depression, unspecified: Secondary | ICD-10-CM | POA: Diagnosis present

## 2023-07-01 DIAGNOSIS — J4489 Other specified chronic obstructive pulmonary disease: Secondary | ICD-10-CM | POA: Diagnosis present

## 2023-07-01 DIAGNOSIS — E1122 Type 2 diabetes mellitus with diabetic chronic kidney disease: Secondary | ICD-10-CM | POA: Diagnosis present

## 2023-07-01 DIAGNOSIS — E875 Hyperkalemia: Secondary | ICD-10-CM | POA: Diagnosis present

## 2023-07-01 DIAGNOSIS — I480 Paroxysmal atrial fibrillation: Secondary | ICD-10-CM | POA: Diagnosis present

## 2023-07-01 DIAGNOSIS — E782 Mixed hyperlipidemia: Secondary | ICD-10-CM | POA: Diagnosis present

## 2023-07-01 LAB — CBC
HCT: 41.8 % (ref 39.0–52.0)
Hemoglobin: 13.8 g/dL (ref 13.0–17.0)
MCH: 28.9 pg (ref 26.0–34.0)
MCHC: 33 g/dL (ref 30.0–36.0)
MCV: 87.4 fL (ref 80.0–100.0)
Platelets: 198 10*3/uL (ref 150–400)
RBC: 4.78 MIL/uL (ref 4.22–5.81)
RDW: 13.1 % (ref 11.5–15.5)
WBC: 7.9 10*3/uL (ref 4.0–10.5)
nRBC: 0 % (ref 0.0–0.2)

## 2023-07-01 LAB — COMPREHENSIVE METABOLIC PANEL
ALT: 25 U/L (ref 0–44)
AST: 23 U/L (ref 15–41)
Albumin: 3.5 g/dL (ref 3.5–5.0)
Alkaline Phosphatase: 63 U/L (ref 38–126)
Anion gap: 9 (ref 5–15)
BUN: 54 mg/dL — ABNORMAL HIGH (ref 8–23)
CO2: 20 mmol/L — ABNORMAL LOW (ref 22–32)
Calcium: 9 mg/dL (ref 8.9–10.3)
Chloride: 108 mmol/L (ref 98–111)
Creatinine, Ser: 2.52 mg/dL — ABNORMAL HIGH (ref 0.61–1.24)
GFR, Estimated: 28 mL/min — ABNORMAL LOW (ref >60)
Glucose, Bld: 86 mg/dL (ref 70–99)
Potassium: 5.5 mmol/L — ABNORMAL HIGH (ref 3.5–5.1)
Sodium: 137 mmol/L (ref 135–145)
Total Bilirubin: 1 mg/dL (ref 0.0–1.2)
Total Protein: 6.5 g/dL (ref 6.5–8.1)

## 2023-07-01 LAB — GLUCOSE, CAPILLARY
Glucose-Capillary: 88 mg/dL (ref 70–99)
Glucose-Capillary: 78 mg/dL (ref 70–99)
Glucose-Capillary: 97 mg/dL (ref 70–99)
Glucose-Capillary: 100 mg/dL — ABNORMAL HIGH (ref 70–99)

## 2023-07-01 MED ORDER — ISOSORBIDE DINITRATE 30 MG PO TABS
30.0000 mg | ORAL_TABLET | Freq: Three times a day (TID) | ORAL | Status: DC
  Administered 2023-07-01 – 2023-07-02 (×4): 30 mg via ORAL
  Filled 2023-07-01 (×6): qty 1

## 2023-07-01 MED ORDER — POLYETHYLENE GLYCOL 3350 17 G PO PACK
17.0000 g | PACK | Freq: Every day | ORAL | Status: DC
  Administered 2023-07-01: 17 g via ORAL
  Filled 2023-07-01 (×2): qty 1

## 2023-07-01 MED ORDER — METOPROLOL SUCCINATE ER 50 MG PO TB24
100.0000 mg | ORAL_TABLET | Freq: Every day | ORAL | Status: DC
  Administered 2023-07-01 – 2023-07-02 (×2): 100 mg via ORAL
  Filled 2023-07-01 (×2): qty 2

## 2023-07-01 MED ORDER — INSULIN ASPART 100 UNIT/ML IJ SOLN: 0.0000 [IU] | INTRAMUSCULAR | Status: DC

## 2023-07-01 MED ORDER — INSULIN ASPART 100 UNIT/ML IJ SOLN
0.0000 [IU] | Freq: Every day | INTRAMUSCULAR | Status: DC
Start: 2023-07-01 — End: 2023-07-02

## 2023-07-01 MED ORDER — SODIUM ZIRCONIUM CYCLOSILICATE 10 G PO PACK
10.0000 g | PACK | Freq: Once | ORAL | Status: AC
  Administered 2023-07-01: 10 g via ORAL
  Filled 2023-07-01: qty 1

## 2023-07-01 MED ORDER — INSULIN ASPART 100 UNIT/ML IJ SOLN: 0.0000 [IU] | Freq: Three times a day (TID) | INTRAMUSCULAR | Status: DC

## 2023-07-01 MED ORDER — CLONIDINE HCL 0.1 MG PO TABS
0.2000 mg | ORAL_TABLET | Freq: Three times a day (TID) | ORAL | Status: DC
  Administered 2023-07-01 – 2023-07-02 (×3): 0.2 mg via ORAL
  Filled 2023-07-01 (×3): qty 2

## 2023-07-01 NOTE — Care Management Obs Status (Signed)
 MEDICARE OBSERVATION STATUS NOTIFICATION   Patient Details  Name: Peter Marsh MRN: [id number] Date of Birth: [birth year]   Medicare Observation Status Notification Given:  Yes    Isaias Cowman, RN 07/01/2023, 9:11 AM

## 2023-07-01 NOTE — Progress Notes (Signed)
 Subjective: Patient says he feels great this morning.  Says he is passing flatus and had multiple BMs, however, only one is documented which is known from earlier last night.  ROS: See above, otherwise other systems negative  Objective: Vital signs in last 24 hours: Temp:  [97.4 F (36.3 C)-98.7 F (37.1 C)] 98.5 F (36.9 C) (02/23 0521) Pulse Rate:  [64-70] 68 (02/23 0521) Resp:  [15-19] 18 (02/23 0521) BP: (122-176)/(77-114) 176/82 (02/23 0521) SpO2:  [98 %-100 %] 99 % (02/23 0521) Weight:  [80.3 kg] 80.3 kg (02/23 0500) Last BM Date : 06/30/23  Intake/Output from previous day: 02/22 0701 - 02/23 0700 In: 100 [IV Piggyback:100] Out: 1500 [Urine:600; Emesis/NG output:900] Intake/Output this shift: No intake/output data recorded.  PE: Gen: NAD Abd: soft, NT, ND, +BS  Lab Results:  Recent Labs    06/30/23 0319 07/01/23 0735  WBC 10.0 7.9  HGB 14.9 13.8  HCT 45.1 41.8  PLT 252 198   BMET Recent Labs    06/30/23 0319 06/30/23 1213  NA 136 135  K 5.3* 5.8*  CL 99 105  CO2 21* 21*  GLUCOSE 116* 100*  BUN 74* 67*  CREATININE 2.84* 2.52*  CALCIUM 9.8 9.0   PT/INR No results for input(s): "LABPROT", "INR" in the last 72 hours. CMP     Component Value Date/Time   NA 135 06/30/2023 1213   NA 139 02/12/2023 1603   K 5.8 (H) 06/30/2023 1213   CL 105 06/30/2023 1213   CO2 21 (L) 06/30/2023 1213   GLUCOSE 100 (H) 06/30/2023 1213   BUN 67 (H) 06/30/2023 1213   BUN 18 02/12/2023 1603   CREATININE 2.52 (H) 06/30/2023 1213   CALCIUM 9.0 06/30/2023 1213   PROT 7.5 06/30/2023 0319   PROT 6.1 11/02/2020 1356   ALBUMIN 3.9 06/30/2023 0319   ALBUMIN 3.5 (L) 11/02/2020 1356   AST 36 06/30/2023 0319   ALT 30 06/30/2023 0319   ALKPHOS 75 06/30/2023 0319   BILITOT 1.0 06/30/2023 0319   BILITOT 0.3 11/02/2020 1356   GFRNONAA 28 (L) 06/30/2023 1213   GFRAA 40 (L) 12/08/2019 0832   Lipase     Component Value Date/Time   LIPASE 38 06/30/2023 0319        Studies/Results: DG Abd Portable 1V-Small Bowel Obstruction Protocol-initial, 8 hr delay Result Date: 06/30/2023 CLINICAL DATA:  Small-bowel obstruction EXAM: PORTABLE ABDOMEN - 1 VIEW COMPARISON:  06/30/2023 FINDINGS: Two supine frontal views of the abdomen and pelvis are obtained. Gaseous distention of the small bowel again identified, measuring up to 3.3 cm, consistent with small-bowel obstruction. Moderate stool throughout the colon. There is dilute contrast seen within the stomach. No contrast evident within the colon at the time of imaging. No masses or abnormal calcifications. Lung bases are clear. IMPRESSION: 1. Continued gaseous distention of the small bowel compatible with obstruction. Electronically Signed   By: Sharlet Salina M.D.   On: 06/30/2023 21:26   CT ABDOMEN PELVIS WO CONTRAST Result Date: 06/30/2023 CLINICAL DATA:  63 year old male with abdominal pain and vomiting. Chronic nephrolithiasis, right UPJ stenosis. EXAM: CT ABDOMEN AND PELVIS WITHOUT CONTRAST TECHNIQUE: Multidetector CT imaging of the abdomen and pelvis was performed following the standard protocol without IV contrast. RADIATION DOSE REDUCTION: This exam was performed according to the departmental dose-optimization program which includes automated exposure control, adjustment of the mA and/or kV according to patient size and/or use of iterative reconstruction technique. COMPARISON:  CT Abdomen and Pelvis 06/16/2023  and earlier. FINDINGS: Lower chest: Normal heart size. No pericardial effusion. Lung bases are stable, essentially clear. Hepatobiliary: Chronic cholecystectomy.  Negative noncontrast liver. Pancreas: Stable chronic calcified pancreatitis sequelae. Spleen: Negative. Adrenals/Urinary Tract: Normal adrenal glands. Stable kidneys. Bilateral nephrolithiasis. Chronic right UPJ stenosis. Chronic exophytic simple fluid density renal cysts (no follow-up imaging recommended). Both ureters are decompressed.  Unremarkable bladder. Stomach/Bowel: Large bowel redundancy, retained gas and stool. The sigmoid colon is located subjacent to the right hemidiaphragm along with the hepatic flexure. Volume of retained stool is moderate and increased from earlier this month. No large bowel inflammation. Diminutive or absent appendix. Nondilated terminal ileum. Fluid distended stomach and duodenum, but tapered ligament of Treitz. Fluid containing proximal small bowel loops are at the upper limits of normal to mildly dilated, with a stacked appearance of the loops in the anterior abdomen. There is a relatively abrupt transition point along the right anterior abdominal wall above the umbilicus on series 3, image 28, but no obstructing etiology or inflammation in the region. No free air or free fluid. Vascular/Lymphatic: Chronic infrarenal abdominal aortic aneurysm is stable measuring 38-39 mm diameter (series 3, image 48). Underlying Calcified aortic atherosclerosis. Vascular patency is not evaluated in the absence of IV contrast. No lymphadenopathy. Reproductive: Negative. Other: No pelvis free fluid. Musculoskeletal: Chronically advanced lower lumbar spine degeneration. Hyperostosis in the thoracic spine with interbody ankylosis. No acute osseous abnormality identified. IMPRESSION: 1. Appearance compatible with Acute Small Bowel Obstruction, with a relatively abrupt distal small bowel transition point in the anterior right abdomen on series 3, image 28. No obstructing etiology identified. No free air or free fluid. 2. Increased retained stool in redundant large bowel since earlier this month. 3. Otherwise stable CT appearance of the CT Abdomen and Pelvis including: - Abdominal Aortic Aneurysm measuring 3.9 cm. Recommend surveillance ultrasound in 3 years. Reference: Journal of Vascular Surgery 67.1 (2018): 2-77. J Am Coll Radiol [phone]. Aortic aneurysm NOS (ICD10-I71.9). Aortic Atherosclerosis (ICD10-I70.0). - Bilateral  nephrolithiasis. Chronic right UPJ stenosis. - Chronic calcific pancreatitis. Electronically Signed   By: Odessa Fleming M.D.   On: 06/30/2023 08:12    Anti-infectives: Anti-infectives (From admission, onward)    None        Assessment/Plan SBO -patient does seem very soft this morning -8-hr delayed film reviewed from last night.  Still with some dilated bowel, but also with stool and air in colon. -given patient's history, and possible lack of documentation of bowel function, will order a new film this morning to follow up on obstructive findings. -if this looks better will start CLD, if this shows persistent SBO type pattern, will continue NPO -mobilize  FEN - NPO for now, awaiting new plain film VTE - heparin ID - none needed  Schizophrenia COPD CKD DM HTN H/o MI Pacemaker   I reviewed hospitalist notes, last 24 h vitals and pain scores, last 48 h intake and output, last 24 h labs and trends, and last 24 h imaging results.   LOS: 0 days    Letha Cape , [facility] 07/01/2023, 8:19 AM Please see Amion for pager number during day hours 7:00am-4:30pm or 7:00am -11:30am on weekends

## 2023-07-01 NOTE — Plan of Care (Signed)

## 2023-07-01 NOTE — Progress Notes (Signed)
 PROGRESS NOTE  Peter Marsh  DOB: 12-14-1960  PCP: Peter Oaks, NP IRJ:[id number]  DOA: 06/30/2023  LOS: 0 days  Hospital Day: 2  Brief narrative: Peter Marsh is a 62 y.o. male with PMH significant for DM2, HTN, HLD, CAD/MI/stent, paroxysmal a flutter,, Mobitz type II AV block s/p PPM, CKD, infrarenal AAA, COPD, h/o bleeding GI ulcers, neuropathy, schizophrenia, anxiety/depression. He was admitted to behavioral health at [facility] for a week and was discharged 2 days ago on 2/20. Patient underwent last PCI to LAD in January. 2/22, patient presented to the ED with complaint of 5 days of nausea, vomiting, abdominal pain, poor oral intake, unable to hold down any intake or hydration.  In the ED, patient was afebrile, hemodynamically stable Initial labs with CBC unremarkable, BMP with potassium 5.3, BUN/creatinine 74/2.84 Subsequent labs with potassium further up at 5.8. Lactic acid level normal Urinalysis unremarkable CT abdomen pelvis showed -Acute SBO with an abrupt transition in the anterior right abdomen appearance.  No free air or free fluid -Increased retained stool in redundant large bowel since earlier this month. -3.9 cm AAA -Bilateral nephrolithiasis, chronic right UPJ stenosis -Chronic calcific pancreatitis  General Surgery was consulted NG tube inserted Admitted to [facility] for conservative management of SBO  Subjective: Patient was seen and examined this morning.  Pleasant middle-aged Male.  Lying on Bed.  Not in Distress. This morning, passing flatus, had multiple BMs Chart reviewed. Remains afebrile, Blood pressure uptrending this morning.  190/85, breathing on room air Most recent labs this morning with CBC unremarkable, BMP with potassium improving at 5.5  Assessment and plan: SBO Presented with nausea, vomiting for 5 days. Imaging as above with SBO as well as large amount of retained stool Started on conservative management with NG tube suction, bowel  rest Patient reports multiple bowel movements overnight. X-ray abdomen was done this morning.  Per general surgery, nondilated bowel loops.  Clear liquid diet ordered. Encourage ambulation Currently on IV hydration with NS at 75 mL/h  Hyperkalemia Potassium level consistently elevated. PTA on potassium supplement.  Currently on hold. Give Lokelma 1 dose today Recent Labs  Lab 06/30/23 0319 06/30/23 1213 07/01/23 0735  K 5.3* 5.8* 5.5*   AKI on CKD 3B Baseline creatinine seems to be less than 2.5. Present with creatinine elevated 2.84.  Gradually improving.  Continue to monitor Recent Labs    10/04/22 0124 10/05/22 0324 11/16/22 2100 02/12/23 1603 03/11/23 2302 06/16/23 1746 06/20/23 0746 06/30/23 0319 06/30/23 1213 07/01/23 0735  BUN 22 28* 31* 18 28* 28* 19 74* 67* 54*  CREATININE 2.01* 2.14* 2.40* 1.78* 1.90* 2.57* 1.82* 2.84* 2.52* 2.52*   Hypertension PTA meds- Toprol 100 mg daily, clonidine 0.2 mg 3 times daily, amlodipine 5 mg daily, hydralazine 100 mg 3 times daily, Isordil 30 mg 3 times daily Blood pressure in 190s this morning Resume Toprol, clonidine and Imdur.  Keep amlodipine and hydralazine on hold for now.  CAD s/p PCI to LAD 05/2022 Infrarenal AAA HLD PTA meds- aspirin 81 mg daily, Zetia 10 mg daily, Resume when SBO resolves  Paroxysmal A-fib Mobitz type II AV block s/p PPM On metoprolol.  Not on anticoagulation presumably because of history of GI bleeding  Type 2 diabetes mellitus A1c 5.4 on 06/22/2023 Does not seem to be on any home meds Start SSI/Accu-Cheks Recent Labs  Lab 06/30/23 0430 07/01/23 0955 07/01/23 1138  GLUCAP 109* 88 78   H/o bleeding GI ulcers Hemoglobin stable Continue PPI Carafate as needed.  Recent Labs    03/11/23 2302 06/16/23 1746 06/20/23 0746 06/30/23 0319 07/01/23 0735  HGB 13.5 12.9* 13.3 14.9 13.8  MCV 88.6 86.4 89.3 86.9 87.4   Neuropathy schizophrenia, anxiety/depression PTA meds- Depakote 250 mg  twice daily, paliperidone 3 mg daily, bupropion 300 mg daily, gabapentin 100 mg twice daily Resume when SBO resolves  COPD Bronchodilators   Mobility: Encourage ambulation  Goals of care   Code Status: Full Code     DVT prophylaxis:  heparin injection 5,000 Units Start: 06/30/23 1400   Antimicrobials: None Fluid: NS at 75 mL/h  Consultants: General surgery Family Communication: None at bedside  Status: Observation Level of care:  Telemetry Medical   Patient is from: Home Needs to continue in-hospital care: Resolving bowel obstruction Anticipated d/c to: Hopefully home in 1 to 2 days      Diet:  Diet Order             Diet clear liquid Fluid consistency: Thin  Diet effective now                   Scheduled Meds:  cloNIDine  0.2 mg Oral TID   heparin  5,000 Units Subcutaneous Q8H   insulin aspart  0-5 Units Subcutaneous QHS   insulin aspart  0-9 Units Subcutaneous TID WC   isosorbide dinitrate  30 mg Oral TID   metoprolol succinate  100 mg Oral Daily   nicotine  21 mg Transdermal Daily   pantoprazole (PROTONIX) IV  40 mg Intravenous Q12H   polyethylene glycol  17 g Oral Daily   sodium chloride flush  3 mL Intravenous Q12H    PRN meds: acetaminophen **OR** acetaminophen, albuterol, morphine injection   Infusions:   sodium chloride 75 mL/hr at 07/01/23 1005   valproate sodium 250 mg (07/01/23 1202)    Antimicrobials: Anti-infectives (From admission, onward)    None       Objective: Vitals:   07/01/23 1200 07/01/23 1307  BP: (!) 154/92 (!) 154/109  Pulse: 77 78  Resp: 20 16  Temp: 98 F (36.7 C) 98.6 F (37 C)  SpO2: 99% 99%    Intake/Output Summary (Last 24 hours) at 07/01/2023 1420 Last data filed at 07/01/2023 0959 Gross per 24 hour  Intake 50 ml  Output 900 ml  Net -850 ml   Filed Weights   06/30/23 0314 07/01/23 0500  Weight: 86.2 kg 80.3 kg   Weight change: -5.897 kg Body mass index is 23.35 kg/m.   Physical  Exam: General exam: Pleasant, middle-aged Caucasian male. Skin: No rashes, lesions or ulcers. HEENT: Atraumatic, normocephalic, no obvious bleeding Lungs: Clear to auscultation bilaterally,  CVS: S1, S2, no murmur,   GI/Abd: Soft, nontender, nondistended, bowel sound present,   CNS: Alert, awake, oriented x 3 Psychiatry: Mood appropriate,  Extremities: No pedal edema, no calf tenderness,   Data Review: I have personally reviewed the laboratory data and studies available.  F/u labs ordered Unresulted Labs (From admission, onward)    None       Total time spent in review of labs and imaging, patient evaluation, formulation of plan, documentation and communication with family: 55 minutes  Signed, Lorin Glass, MD [facility] 07/01/2023

## 2023-07-01 NOTE — Plan of Care (Signed)
  Problem: Education: Goal: Knowledge of General Education information will improve Description: Including pain rating scale, medication(s)/side effects and non-pharmacologic comfort measures Outcome: Progressing   Problem: Clinical Measurements: Goal: Will remain free from infection Outcome: Progressing   Problem: Activity: Goal: Risk for activity intolerance will decrease Outcome: Progressing   Problem: Coping: Goal: Level of anxiety will decrease Outcome: Progressing   Problem: Elimination: Goal: Will not experience complications related to bowel motility Outcome: Progressing Goal: Will not experience complications related to urinary retention Outcome: Progressing   Problem: Safety: Goal: Ability to remain free from injury will improve Outcome: Progressing   Problem: Skin Integrity: Goal: Risk for impaired skin integrity will decrease Outcome: Progressing   Problem: Skin Integrity: Goal: Risk for impaired skin integrity will decrease Outcome: Progressing

## 2023-07-02 DIAGNOSIS — R112 Nausea with vomiting, unspecified: Secondary | ICD-10-CM | POA: Diagnosis not present

## 2023-07-02 LAB — BASIC METABOLIC PANEL
Anion gap: 5 (ref 5–15)
BUN: 33 mg/dL — ABNORMAL HIGH (ref 8–23)
CO2: 23 mmol/L (ref 22–32)
Calcium: 8.7 mg/dL — ABNORMAL LOW (ref 8.9–10.3)
Chloride: 106 mmol/L (ref 98–111)
Creatinine, Ser: 2.27 mg/dL — ABNORMAL HIGH (ref 0.61–1.24)
GFR, Estimated: 31 mL/min — ABNORMAL LOW (ref >60)
Glucose, Bld: 95 mg/dL (ref 70–99)
Potassium: 5.1 mmol/L (ref 3.5–5.1)
Sodium: 134 mmol/L — ABNORMAL LOW (ref 135–145)

## 2023-07-02 LAB — GLUCOSE, CAPILLARY
Glucose-Capillary: 102 mg/dL — ABNORMAL HIGH (ref 70–99)
Glucose-Capillary: 106 mg/dL — ABNORMAL HIGH (ref 70–99)
Glucose-Capillary: 76 mg/dL (ref 70–99)
Glucose-Capillary: 99 mg/dL (ref 70–99)

## 2023-07-02 MED ORDER — DIVALPROEX SODIUM 250 MG PO DR TAB
250.0000 mg | DELAYED_RELEASE_TABLET | Freq: Two times a day (BID) | ORAL | Status: DC
  Administered 2023-07-02: 250 mg via ORAL
  Filled 2023-07-02 (×2): qty 1

## 2023-07-02 MED ORDER — PANTOPRAZOLE SODIUM 40 MG PO TBEC
40.0000 mg | DELAYED_RELEASE_TABLET | Freq: Two times a day (BID) | ORAL | Status: DC
  Administered 2023-07-02: 40 mg via ORAL
  Filled 2023-07-02: qty 1

## 2023-07-02 NOTE — Plan of Care (Signed)

## 2023-07-02 NOTE — Discharge Summary (Signed)
 Physician Discharge Summary  ALTO GANDOLFO GEX:[id number] DOB: 06/18/1960 DOA: 06/30/2023  PCP: Estevan Oaks, NP  Admit date: 06/30/2023 Discharge date: 07/02/2023  Admitted From: Home Discharge disposition: Home  Recommendations at discharge:  Start potassium pills.   Brief narrative: Peter Marsh is a 63 y.o. male with PMH significant for DM2, HTN, HLD, CAD/MI/stent, paroxysmal a flutter,, Mobitz type II AV block s/p PPM, CKD, infrarenal AAA, COPD, h/o bleeding GI ulcers, neuropathy, schizophrenia, anxiety/depression. He was admitted to behavioral health at [facility] for a week and was discharged 2 days ago on 2/20. Patient underwent last PCI to LAD in January. 2/22, patient presented to the ED with complaint of 5 days of nausea, vomiting, abdominal pain, poor oral intake, unable to hold down any intake or hydration.  In the ED, patient was afebrile, hemodynamically stable Initial labs with CBC unremarkable, BMP with potassium 5.3, BUN/creatinine 74/2.84 Subsequent labs with potassium further up at 5.8. Lactic acid level normal Urinalysis unremarkable CT abdomen pelvis showed -Acute SBO with an abrupt transition in the anterior right abdomen appearance.  No free air or free fluid -Increased retained stool in redundant large bowel since earlier this month. -3.9 cm AAA -Bilateral nephrolithiasis, chronic right UPJ stenosis -Chronic calcific pancreatitis  General Surgery was consulted NG tube inserted Admitted to [facility] for conservative management of SBO  Subjective: Patient was seen and examined this morning.  Pleasant middle-aged Male.  Lying on Bed.  Not in Distress. Wife at bedside. Bowel obstruction resolved. Stable for discharge to home today.  Hospital course: SBO Presented with nausea, vomiting for 5 days. Imaging as above with SBO as well as large amount of retained stool Started on conservative management with NG tube suction, bowel rest Improved.   Tolerated liquid diet.  Advance to soft this morning which he tolerated. Adequately hydrated Stable for discharge per general surgery  Hyperkalemia Potassium level consistently elevated. PTA on potassium supplement.  Currently on hold. Give Lokelma 1 dose yesterday.  Post discharge, I would suggest not to resume potassium pills. Recent Labs  Lab 06/30/23 0319 06/30/23 1213 07/01/23 0735 07/02/23 0926  K 5.3* 5.8* 5.5* 5.1   AKI on CKD 3B Baseline creatinine seems to be less than 2.5. Present with creatinine elevated 2.84.  Gradually improved with IV fluid. Recent Labs    10/05/22 0324 11/16/22 2100 02/12/23 1603 03/11/23 2302 06/16/23 1746 06/20/23 0746 06/30/23 0319 06/30/23 1213 07/01/23 0735 07/02/23 0926  BUN 28* 31* 18 28* 28* 19 74* 67* 54* 33*  CREATININE 2.14* 2.40* 1.78* 1.90* 2.57* 1.82* 2.84* 2.52* 2.52* 2.27*   Hypertension PTA meds- Toprol 100 mg daily, clonidine 0.2 mg 3 times daily, amlodipine 5 mg daily, hydralazine 100 mg 3 times daily, Isordil 30 mg 3 times daily Blood pressure medicines gradually resumed.  Continue same as before  CAD s/p PCI to LAD 05/2022 Infrarenal AAA HLD PTA meds- aspirin 81 mg daily, Zetia 10 mg daily, Resume when SBO resolves  Paroxysmal A-fib Mobitz type II AV block s/p PPM On metoprolol.  Not on anticoagulation presumably because of history of GI bleeding  Type 2 diabetes mellitus A1c 5.4 on 06/22/2023 Not on home meds.  H/o bleeding GI ulcers Hemoglobin stable Continue PPI Carafate as needed. Recent Labs    03/11/23 2302 06/16/23 1746 06/20/23 0746 06/30/23 0319 07/01/23 0735  HGB 13.5 12.9* 13.3 14.9 13.8  MCV 88.6 86.4 89.3 86.9 87.4   Neuropathy schizophrenia, anxiety/depression PTA meds- Depakote 250 mg twice daily, paliperidone 3  mg daily, bupropion 300 mg daily, gabapentin 100 mg twice daily Resume postdischarge.  COPD Bronchodilators   Goals of care   Code Status: Full Code   Consultants:  General surgery Family Communication: Wife at bedside    Diet:  Diet Order             DIET SOFT Room service appropriate? Yes; Fluid consistency: Thin  Diet effective now           Diet general                   Nutritional status:  Body mass index is 23.75 kg/m.       Wounds:  -    Discharge Exam:   Vitals:   07/01/23 1950 07/02/23 0419 07/02/23 0500 07/02/23 0827  BP: (!) 151/63 103/72  110/73  Pulse: 73 64  62  Resp: 18 18  18   Temp: 97.9 F (36.6 C) 98.1 F (36.7 C)  97.7 F (36.5 C)  TempSrc: Oral Oral  Oral  SpO2: 99% 100%  100%  Weight:   81.6 kg   Height:        Body mass index is 23.75 kg/m.  General exam: Pleasant, middle-aged Caucasian male. Skin: No rashes, lesions or ulcers. HEENT: Atraumatic, normocephalic, no obvious bleeding Lungs: Clear to auscultation bilaterally,  CVS: S1, S2, no murmur,   GI/Abd: Soft, nontender, nondistended, bowel sound present,   CNS: Alert, awake, oriented x 3 Psychiatry: Mood appropriate,  Extremities: No pedal edema, no calf tenderness,   Follow ups:    Follow-up Information     Estevan Oaks, NP Follow up.   Specialty: Nurse Practitioner Contact information: [address] [place] [address] [phone]                 Discharge Instructions:   Discharge Instructions     Call MD for:  difficulty breathing, headache or visual disturbances   Complete by: As directed    Call MD for:  extreme fatigue   Complete by: As directed    Call MD for:  hives   Complete by: As directed    Call MD for:  persistant dizziness or light-headedness   Complete by: As directed    Call MD for:  persistant nausea and vomiting   Complete by: As directed    Call MD for:  severe uncontrolled pain   Complete by: As directed    Call MD for:  temperature >100.4   Complete by: As directed    Diet general   Complete by: As directed    Discharge instructions   Complete by: As directed     Recommendations at discharge:   Start potassium pills.  General discharge instructions: Follow with Primary MD Estevan Oaks, NP in 7 days  Please request your PCP  to go over your hospital tests, procedures, radiology results at the follow up. Please get your medicines reviewed and adjusted.  Your PCP may decide to repeat certain labs or tests as needed. Do not drive, operate heavy machinery, perform activities at heights, swimming or participation in water activities or provide baby sitting services if your were admitted for syncope or siezures until you have seen by Primary MD or a Neurologist and advised to do so again. [place] Controlled Substance Reporting System database was reviewed. Do not drive, operate heavy machinery, perform activities at heights, swim, participate in water activities or provide baby-sitting services while on medications for pain,  sleep and mood until your outpatient physician has reevaluated you and advised to do so again.  You are strongly recommended to comply with the dose, frequency and duration of prescribed medications. Activity: As tolerated with Full fall precautions use walker/cane & assistance as needed Avoid using any recreational substances like cigarette, tobacco, alcohol, or non-prescribed drug. If you experience worsening of your admission symptoms, develop shortness of breath, life threatening emergency, suicidal or homicidal thoughts you must seek medical attention immediately by calling 911 or calling your MD immediately  if symptoms less severe. You must read complete instructions/literature along with all the possible adverse reactions/side effects for all the medicines you take and that have been prescribed to you. Take any new medicine only after you have completely understood and accepted all the possible adverse reactions/side effects.  Wear Seat belts while driving. You were cared for by a hospitalist during your hospital stay. If you  have any questions about your discharge medications or the care you received while you were in the hospital after you are discharged, you can call the unit and ask to speak with the hospitalist or the covering physician. Once you are discharged, your primary care physician will handle any further medical issues. Please note that NO REFILLS for any discharge medications will be authorized once you are discharged, as it is imperative that you return to your primary care physician (or establish a relationship with a primary care physician if you do not have one).   Increase activity slowly   Complete by: As directed        Discharge Medications:   Allergies as of 07/02/2023       Reactions   Methylpyrrolidone Hives   froze the intestine   Niacin Itching, Nausea And Vomiting, Other (See Comments)   Flushing, itching, tingling    Nsaids Other (See Comments)   Other Reaction(s): CKD 4   Ace Inhibitors    Other reaction(s): CKD 4   Norvasc [amlodipine Besylate] Other (See Comments)   Swollen Feet   Oxybutynin Chloride [oxybutynin Chloride Er] Other (See Comments)   froze the intestine   Statins    Reports severe reaction but cannot remember exactly what it was    Ciprofloxacin Rash, Other (See Comments)   Felt flushed   Oxybutynin Rash, Other (See Comments)   bowel obst   Vesicare [solifenacin Succinate] Rash, Other (See Comments)   Froze the intestine        Medication List     STOP taking these medications    potassium chloride 20 MEQ packet Commonly known as: KLOR-CON       TAKE these medications    albuterol 108 (90 Base) MCG/ACT inhaler Commonly known as: VENTOLIN HFA Inhale 1-2 puffs into the lungs every 6 (six) hours as needed for wheezing or shortness of breath.   amLODipine 5 MG tablet Commonly known as: NORVASC Take 1 tablet (5 mg total) by mouth daily.   aspirin 81 MG chewable tablet Chew 1 tablet (81 mg total) by mouth daily.   buPROPion 150 MG 12 hr  tablet Commonly known as: WELLBUTRIN SR Take 2 tablets (300 mg total) by mouth daily.   cloNIDine 0.2 MG tablet Commonly known as: CATAPRES Take 1 tablet (0.2 mg total) by mouth 3 (three) times daily.   divalproex 250 MG DR tablet Commonly known as: DEPAKOTE Take 1 tablet (250 mg total) by mouth every 12 (twelve) hours.   ezetimibe 10 MG tablet Commonly known as: ZETIA Take 1  tablet (10 mg total) by mouth daily.   gabapentin 100 MG capsule Commonly known as: Neurontin Take 1 capsule (100 mg total) by mouth 2 (two) times daily.   hydrALAZINE 100 MG tablet Commonly known as: APRESOLINE Take 1 tablet (100 mg total) by mouth 3 (three) times daily.   isosorbide dinitrate 30 MG tablet Commonly known as: ISORDIL Take 1 tablet (30 mg total) by mouth 3 (three) times daily.   metoprolol succinate 100 MG 24 hr tablet Commonly known as: TOPROL-XL Take 1 tablet (100 mg total) by mouth daily.   mometasone-formoterol 200-5 MCG/ACT Aero Commonly known as: DULERA Inhale 2 puffs into the lungs 2 (two) times daily.   nitroGLYCERIN 0.4 MG SL tablet Commonly known as: NITROSTAT Place 0.4 mg under the tongue every 5 (five) minutes as needed for chest pain.   ondansetron 4 MG disintegrating tablet Commonly known as: ZOFRAN-ODT Take 1 tablet (4 mg total) by mouth every 8 (eight) hours as needed for nausea or vomiting.   paliperidone 3 MG 24 hr tablet Commonly known as: Invega Take 1 tablet (3 mg total) by mouth daily.   pantoprazole 40 MG tablet Commonly known as: PROTONIX TAKE 1 TABLET(40 MG) BY MOUTH TWICE DAILY   sucralfate 1 GM/10ML suspension Commonly known as: Carafate Take 10 mLs (1 g total) by mouth 4 (four) times daily -  with meals and at bedtime. What changed:  when to take this reasons to take this         The results of significant diagnostics from this hospitalization (including imaging, microbiology, ancillary and laboratory) are listed below for reference.     Procedures and Diagnostic Studies:   DG Abd Portable 1V Result Date: 07/01/2023 CLINICAL DATA:  Small bowel obstruction. EXAM: PORTABLE ABDOMEN - 1 VIEW COMPARISON:  June 30, 2023. FINDINGS: No abnormal bowel dilatation is noted. Moderate amount of stool seen throughout the colon. IMPRESSION: No abnormal bowel dilatation. Electronically Signed   By: Lupita Raider M.D.   On: 07/01/2023 10:27   DG Abd Portable 1V-Small Bowel Obstruction Protocol-initial, 8 hr delay Result Date: 06/30/2023 CLINICAL DATA:  Small-bowel obstruction EXAM: PORTABLE ABDOMEN - 1 VIEW COMPARISON:  06/30/2023 FINDINGS: Two supine frontal views of the abdomen and pelvis are obtained. Gaseous distention of the small bowel again identified, measuring up to 3.3 cm, consistent with small-bowel obstruction. Moderate stool throughout the colon. There is dilute contrast seen within the stomach. No contrast evident within the colon at the time of imaging. No masses or abnormal calcifications. Lung bases are clear. IMPRESSION: 1. Continued gaseous distention of the small bowel compatible with obstruction. Electronically Signed   By: Sharlet Salina M.D.   On: 06/30/2023 21:26   CT ABDOMEN PELVIS WO CONTRAST Result Date: 06/30/2023 CLINICAL DATA:  63 year old male with abdominal pain and vomiting. Chronic nephrolithiasis, right UPJ stenosis. EXAM: CT ABDOMEN AND PELVIS WITHOUT CONTRAST TECHNIQUE: Multidetector CT imaging of the abdomen and pelvis was performed following the standard protocol without IV contrast. RADIATION DOSE REDUCTION: This exam was performed according to the departmental dose-optimization program which includes automated exposure control, adjustment of the mA and/or kV according to patient size and/or use of iterative reconstruction technique. COMPARISON:  CT Abdomen and Pelvis 06/16/2023 and earlier. FINDINGS: Lower chest: Normal heart size. No pericardial effusion. Lung bases are stable, essentially clear.  Hepatobiliary: Chronic cholecystectomy.  Negative noncontrast liver. Pancreas: Stable chronic calcified pancreatitis sequelae. Spleen: Negative. Adrenals/Urinary Tract: Normal adrenal glands. Stable kidneys. Bilateral nephrolithiasis. Chronic right UPJ stenosis.  Chronic exophytic simple fluid density renal cysts (no follow-up imaging recommended). Both ureters are decompressed. Unremarkable bladder. Stomach/Bowel: Large bowel redundancy, retained gas and stool. The sigmoid colon is located subjacent to the right hemidiaphragm along with the hepatic flexure. Volume of retained stool is moderate and increased from earlier this month. No large bowel inflammation. Diminutive or absent appendix. Nondilated terminal ileum. Fluid distended stomach and duodenum, but tapered ligament of Treitz. Fluid containing proximal small bowel loops are at the upper limits of normal to mildly dilated, with a stacked appearance of the loops in the anterior abdomen. There is a relatively abrupt transition point along the right anterior abdominal wall above the umbilicus on series 3, image 28, but no obstructing etiology or inflammation in the region. No free air or free fluid. Vascular/Lymphatic: Chronic infrarenal abdominal aortic aneurysm is stable measuring 38-39 mm diameter (series 3, image 48). Underlying Calcified aortic atherosclerosis. Vascular patency is not evaluated in the absence of IV contrast. No lymphadenopathy. Reproductive: Negative. Other: No pelvis free fluid. Musculoskeletal: Chronically advanced lower lumbar spine degeneration. Hyperostosis in the thoracic spine with interbody ankylosis. No acute osseous abnormality identified. IMPRESSION: 1. Appearance compatible with Acute Small Bowel Obstruction, with a relatively abrupt distal small bowel transition point in the anterior right abdomen on series 3, image 28. No obstructing etiology identified. No free air or free fluid. 2. Increased retained stool in redundant  large bowel since earlier this month. 3. Otherwise stable CT appearance of the CT Abdomen and Pelvis including: - Abdominal Aortic Aneurysm measuring 3.9 cm. Recommend surveillance ultrasound in 3 years. Reference: Journal of Vascular Surgery 67.1 (2018): 2-77. J Am Coll Radiol [phone]. Aortic aneurysm NOS (ICD10-I71.9). Aortic Atherosclerosis (ICD10-I70.0). - Bilateral nephrolithiasis. Chronic right UPJ stenosis. - Chronic calcific pancreatitis. Electronically Signed   By: Odessa Fleming M.D.   On: 06/30/2023 08:12     Labs:   Basic Metabolic Panel: Recent Labs  Lab 06/30/23 0319 06/30/23 1213 07/01/23 0735 07/02/23 0926  NA 136 135 137 134*  K 5.3* 5.8* 5.5* 5.1  CL 99 105 108 106  CO2 21* 21* 20* 23  GLUCOSE 116* 100* 86 95  BUN 74* 67* 54* 33*  CREATININE 2.84* 2.52* 2.52* 2.27*  CALCIUM 9.8 9.0 9.0 8.7*   GFR Estimated Creatinine Clearance: 37.2 mL/min (A) (by C-G formula based on SCr of 2.27 mg/dL (H)). Liver Function Tests: Recent Labs  Lab 06/30/23 0319 07/01/23 0735  AST 36 23  ALT 30 25  ALKPHOS 75 63  BILITOT 1.0 1.0  PROT 7.5 6.5  ALBUMIN 3.9 3.5   Recent Labs  Lab 06/30/23 0319  LIPASE 38   No results for input(s): "AMMONIA" in the last 168 hours. Coagulation profile No results for input(s): "INR", "PROTIME" in the last 168 hours.  CBC: Recent Labs  Lab 06/30/23 0319 07/01/23 0735  WBC 10.0 7.9  HGB 14.9 13.8  HCT 45.1 41.8  MCV 86.9 87.4  PLT 252 198   Cardiac Enzymes: No results for input(s): "CKTOTAL", "CKMB", "CKMBINDEX", "TROPONINI" in the last 168 hours. BNP: Invalid input(s): "POCBNP" CBG: Recent Labs  Lab 07/01/23 1741 07/01/23 2209 07/02/23 0011 07/02/23 0420 07/02/23 0824  GLUCAP 97 100* 76 102* 99   D-Dimer No results for input(s): "DDIMER" in the last 72 hours. Hgb A1c No results for input(s): "HGBA1C" in the last 72 hours. Lipid Profile No results for input(s): "CHOL", "HDL", "LDLCALC", "TRIG", "CHOLHDL",  "LDLDIRECT" in the last 72 hours. Thyroid function studies No results for input(s): "  TSH", "T4TOTAL", "T3FREE", "THYROIDAB" in the last 72 hours.  Invalid input(s): "FREET3" Anemia work up No results for input(s): "VITAMINB12", "FOLATE", "FERRITIN", "TIBC", "IRON", "RETICCTPCT" in the last 72 hours. Microbiology No results found for this or any previous visit (from the past 240 hours).  Time coordinating discharge: 45 minutes  Signed: Quention Mcneill  [facility] 07/02/2023, 11:07 AM

## 2023-07-02 NOTE — Progress Notes (Signed)
 Subjective: He has had several bowel movements last night and already this morning that are soft formed. Denies abdominal pain, n/v, distension with clear liquids  Wife at bedside  Objective: Vital signs in last 24 hours: Temp:  [97.2 F (36.2 C)-98.6 F (37 C)] 98.1 F (36.7 C) (02/24 0419) Pulse Rate:  [64-100] 64 (02/24 0419) Resp:  [16-20] 18 (02/24 0419) BP: (103-190)/(63-109) 103/72 (02/24 0419) SpO2:  [99 %-100 %] 100 % (02/24 0419) Weight:  [81.6 kg] 81.6 kg (02/24 0500) Last BM Date : 06/30/23  Intake/Output from previous day: 02/23 0701 - 02/24 0700 In: 786 [P.O.:786] Out: 1200 [Urine:1200] Intake/Output this shift: No intake/output data recorded.  PE: Gen: NAD Abd: soft, NT, ND, +BS  Lab Results:  Recent Labs    06/30/23 0319 07/01/23 0735  WBC 10.0 7.9  HGB 14.9 13.8  HCT 45.1 41.8  PLT 252 198   BMET Recent Labs    06/30/23 1213 07/01/23 0735  NA 135 137  K 5.8* 5.5*  CL 105 108  CO2 21* 20*  GLUCOSE 100* 86  BUN 67* 54*  CREATININE 2.52* 2.52*  CALCIUM 9.0 9.0   PT/INR No results for input(s): "LABPROT", "INR" in the last 72 hours. CMP     Component Value Date/Time   NA 137 07/01/2023 0735   NA 139 02/12/2023 1603   K 5.5 (H) 07/01/2023 0735   CL 108 07/01/2023 0735   CO2 20 (L) 07/01/2023 0735   GLUCOSE 86 07/01/2023 0735   BUN 54 (H) 07/01/2023 0735   BUN 18 02/12/2023 1603   CREATININE 2.52 (H) 07/01/2023 0735   CALCIUM 9.0 07/01/2023 0735   PROT 6.5 07/01/2023 0735   PROT 6.1 11/02/2020 1356   ALBUMIN 3.5 07/01/2023 0735   ALBUMIN 3.5 (L) 11/02/2020 1356   AST 23 07/01/2023 0735   ALT 25 07/01/2023 0735   ALKPHOS 63 07/01/2023 0735   BILITOT 1.0 07/01/2023 0735   BILITOT 0.3 11/02/2020 1356   GFRNONAA 28 (L) 07/01/2023 0735   GFRAA 40 (L) 12/08/2019 0832   Lipase     Component Value Date/Time   LIPASE 38 06/30/2023 0319       Studies/Results: DG Abd Portable 1V Result Date: 07/01/2023 CLINICAL DATA:   Small bowel obstruction. EXAM: PORTABLE ABDOMEN - 1 VIEW COMPARISON:  June 30, 2023. FINDINGS: No abnormal bowel dilatation is noted. Moderate amount of stool seen throughout the colon. IMPRESSION: No abnormal bowel dilatation. Electronically Signed   By: Lupita Raider M.D.   On: 07/01/2023 10:27   DG Abd Portable 1V-Small Bowel Obstruction Protocol-initial, 8 hr delay Result Date: 06/30/2023 CLINICAL DATA:  Small-bowel obstruction EXAM: PORTABLE ABDOMEN - 1 VIEW COMPARISON:  06/30/2023 FINDINGS: Two supine frontal views of the abdomen and pelvis are obtained. Gaseous distention of the small bowel again identified, measuring up to 3.3 cm, consistent with small-bowel obstruction. Moderate stool throughout the colon. There is dilute contrast seen within the stomach. No contrast evident within the colon at the time of imaging. No masses or abnormal calcifications. Lung bases are clear. IMPRESSION: 1. Continued gaseous distention of the small bowel compatible with obstruction. Electronically Signed   By: Sharlet Salina M.D.   On: 06/30/2023 21:26    Anti-infectives: Anti-infectives (From admission, onward)    None        Assessment/Plan SBO -abd exam benign and soft -xray yesterday with stool in colon and no abnormal SB dilatation - having bowel movements and tolerating  clears. Advance to soft/low fiber. If tolerates without n/v/abdominal pain/distension stable for dc from surgical standpoint -mobilize  FEN - soft VTE - heparin subq ID - none needed  Schizophrenia COPD CKD DM HTN H/o MI Pacemaker   I reviewed hospitalist notes, last 24 h vitals and pain scores, last 48 h intake and output, last 24 h labs and trends, and last 24 h imaging results.   LOS: 1 day    Eric Form , [facility] 07/02/2023, 8:00 AM Please see Amion for pager number during day hours 7:00am-4:30pm or 7:00am -11:30am on weekends

## 2023-07-11 ENCOUNTER — Ambulatory Visit (HOSPITAL_COMMUNITY): Admission: EM | Admit: 2023-07-11 | Discharge: 2023-07-11 | Disposition: A | Discharge status: Home / Self Care

## 2023-07-11 NOTE — ED Notes (Signed)
 Patient left AMA.

## 2023-09-24 ENCOUNTER — Ambulatory Visit: Payer: Self-pay

## 2023-09-26 ENCOUNTER — Telehealth: Payer: Self-pay

## 2023-09-26 NOTE — Telephone Encounter (Signed)
 Forwarding to Social Work team for any further recommendations due to patient current/ future living situation.  Unsure of how this would be handled as [place] is [place] of [place].

## 2023-09-26 NOTE — Telephone Encounter (Signed)
 [place], [place]  Sent: Wed Sep 26, 2023  3:51 PM  To: Annemarie Barry, RN  Cc: P [facility] Device         Message  Hi, thanks for looping social work in. Fredrik Jensen is out of clinic until Monday but my understanding is that, unfortunately, we aren't able to really provide assistance across state lines (other than parts of [place]  in the [facility]). This is in part because I cannot vouch and do not have any connections with any of the non-profits or social service organizations in that area, or and awareness of the services they may or may not be able to offer the pt. Housing is very limited in this area of [place], and unfortunately we cannot guarantee housing if he moves back, but if he should move back to this service area and not have anywhere to stay we can try and connect him with local nonprofit partners.    Recommend that if he has not done so that he contact his local Department of Health and Human Services/Social Services and they may be able to further connect him with local non-profits/housing services. Some counties may also have a Pensions consultant program they can connect him with as well should he not have housing.    Sorry this isn't too much help.    Verlena Glenn

## 2023-09-26 NOTE — Telephone Encounter (Signed)
 I called and spoke with the patient to advise him of social works response as stated below.  The patient advised voices understanding of these recommendations.  He states he has permanently moved to [place]  and is receiving a SS check, but is unable to find housing. His wife left him and all his belongings, he thinks, are in storage somewhere in [place], but he has not access to these.   The patient confirms he has follow up with a PCP in [place]  on 10/03/23 and will have them help him establish care with cardiology/ EP & nephrology.   I advised the patient once he established with an EP in [place] , they can assume responsibilty for his device.  In the interim, he is requesting a new transmitter be shipped to him at his brother's address:  [address] [place], [place] [address]  I advised the patient I will forward to [facility] to see if she can assist with ordering him a new monitor and will call back if needed.   The patient voices understanding and is agreeable. He was very appreciative of the call back.

## 2023-09-26 NOTE — Telephone Encounter (Signed)
 The pt states his monitor is in storage in [place].  The pt states he is in [place]  with his brother but he has 5 days to move out and he has no where to go. He is about to be homeless and he is scared. He states he has a lot of health issues and do not know what to do. He do gets a Radio producer but just cannot find a place to stay. I let him know the nurse will give him a call once she speak with the social worker to see what kind of help we can get for him if any.  His phone number is [phone].

## 2023-09-27 NOTE — Telephone Encounter (Signed)
 Monitor ordered. He should receive it in 7-14 business days.

## 2023-10-08 ENCOUNTER — Ambulatory Visit (INDEPENDENT_AMBULATORY_CARE_PROVIDER_SITE_OTHER): Payer: Self-pay

## 2023-10-08 DIAGNOSIS — I441 Atrioventricular block, second degree: Secondary | ICD-10-CM

## 2023-10-08 LAB — CUP PACEART REMOTE DEVICE CHECK
Battery Remaining Longevity: 81 mo
Battery Voltage: 2.98 V
Brady Statistic AP VP Percent: 0.24 %
Brady Statistic AP VS Percent: 82.3 %
Brady Statistic AS VP Percent: 0.01 %
Brady Statistic AS VS Percent: 17.45 %
Brady Statistic RA Percent Paced: 82.59 %
Brady Statistic RV Percent Paced: 0.25 %
Date Time Interrogation Session: 20250531200009
Implantable Lead Connection Status: 753985
Implantable Lead Connection Status: 753985
Implantable Lead Implant Date: 20181022
Implantable Lead Implant Date: 20181022
Implantable Lead Location: 753859
Implantable Lead Location: 753860
Implantable Lead Model: 3830
Implantable Lead Model: 5076
Implantable Pulse Generator Implant Date: 20181022
Lead Channel Impedance Value: 304 Ohm
Lead Channel Impedance Value: 323 Ohm
Lead Channel Impedance Value: 456 Ohm
Lead Channel Impedance Value: 475 Ohm
Lead Channel Pacing Threshold Amplitude: 0.625 V
Lead Channel Pacing Threshold Amplitude: 1 V
Lead Channel Pacing Threshold Pulse Width: 0.4 ms
Lead Channel Pacing Threshold Pulse Width: 0.4 ms
Lead Channel Sensing Intrinsic Amplitude: 2.875 mV
Lead Channel Sensing Intrinsic Amplitude: 2.875 mV
Lead Channel Sensing Intrinsic Amplitude: 9 mV
Lead Channel Sensing Intrinsic Amplitude: 9 mV
Lead Channel Setting Pacing Amplitude: 1.5 V
Lead Channel Setting Pacing Amplitude: 2.5 V
Lead Channel Setting Pacing Pulse Width: 1 ms
Lead Channel Setting Sensing Sensitivity: 1.2 mV
Zone Setting Status: 755011
Zone Setting Status: 755011

## 2023-10-11 ENCOUNTER — Ambulatory Visit: Payer: Self-pay | Admitting: Internal Medicine

## 2023-11-26 NOTE — Progress Notes (Signed)
 Remote pacemaker transmission.

## 2023-12-24 ENCOUNTER — Ambulatory Visit: Payer: Self-pay

## 2023-12-31 ENCOUNTER — Telehealth: Payer: Self-pay | Admitting: Internal Medicine

## 2023-12-31 NOTE — Telephone Encounter (Signed)
 Can send information to:  Fax: [phone] Phone: [phone]

## 2023-12-31 NOTE — Telephone Encounter (Signed)
 Lauren from [facility] is calling to request pt's pacemaker information and the last check readings for procedure tomorrow. Please advise.

## 2023-12-31 NOTE — Telephone Encounter (Signed)
 PT has recall ands needs apt.

## 2023-12-31 NOTE — Telephone Encounter (Signed)
 Spoke to patient  (2 patient identifiers provided)  to inquire further about an upcoming procedure and the facility requesting records. Patient stated he was having a procedure tomorrow  01/01/24 and it was fine to send most recent remote transmission.   Rozelle KIDD., RN was witness of conversation via phone.

## 2024-01-02 NOTE — Telephone Encounter (Signed)
 Spoke w/ patient to schedule from recall for yearly appt w/ Dr. Waddell - pt states he lives in [place]  now and has a cardiologist for PPM.

## 2024-01-02 NOTE — Telephone Encounter (Signed)
 Spoke to Forest at [facility] who will send a message to their device clinic about needing to accept patient in [employer]. Writer has not released patient as of yet d/t unable to speak to anyone. States someone will call  back when available.  Routing to CMA for follow up when call if returned.

## 2024-01-04 NOTE — Telephone Encounter (Signed)
 Spoke with jill again she didn't know how to request pt in [facility] and there are more than 1 [facility] clinics and dont want to release pt to the wrong one. Kate will call back to follow up

## 2024-01-07 ENCOUNTER — Ambulatory Visit (INDEPENDENT_AMBULATORY_CARE_PROVIDER_SITE_OTHER): Payer: Self-pay

## 2024-01-07 DIAGNOSIS — I447 Left bundle-branch block, unspecified: Secondary | ICD-10-CM | POA: Diagnosis not present

## 2024-01-09 LAB — CUP PACEART REMOTE DEVICE CHECK
Battery Remaining Longevity: 77 mo
Battery Voltage: 2.97 V
Brady Statistic AP VP Percent: 0.23 %
Brady Statistic AP VS Percent: 94.09 %
Brady Statistic AS VP Percent: 0 %
Brady Statistic AS VS Percent: 5.68 %
Brady Statistic RA Percent Paced: 94.33 %
Brady Statistic RV Percent Paced: 0.23 %
Date Time Interrogation Session: 20250831231443
Implantable Lead Connection Status: 753985
Implantable Lead Connection Status: 753985
Implantable Lead Implant Date: 20181022
Implantable Lead Implant Date: 20181022
Implantable Lead Location: 753859
Implantable Lead Location: 753860
Implantable Lead Model: 3830
Implantable Lead Model: 5076
Implantable Pulse Generator Implant Date: 20181022
Lead Channel Impedance Value: 285 Ohm
Lead Channel Impedance Value: 304 Ohm
Lead Channel Impedance Value: 418 Ohm
Lead Channel Impedance Value: 456 Ohm
Lead Channel Pacing Threshold Amplitude: 0.75 V
Lead Channel Pacing Threshold Amplitude: 1 V
Lead Channel Pacing Threshold Pulse Width: 0.4 ms
Lead Channel Pacing Threshold Pulse Width: 0.4 ms
Lead Channel Sensing Intrinsic Amplitude: 10.375 mV
Lead Channel Sensing Intrinsic Amplitude: 10.375 mV
Lead Channel Sensing Intrinsic Amplitude: 4.5 mV
Lead Channel Sensing Intrinsic Amplitude: 4.5 mV
Lead Channel Setting Pacing Amplitude: 1.5 V
Lead Channel Setting Pacing Amplitude: 2.5 V
Lead Channel Setting Pacing Pulse Width: 1 ms
Lead Channel Setting Sensing Sensitivity: 1.2 mV
Zone Setting Status: 755011
Zone Setting Status: 755011

## 2024-01-11 ENCOUNTER — Ambulatory Visit: Payer: Self-pay | Admitting: Internal Medicine

## 2024-01-14 NOTE — Progress Notes (Signed)
 Remote PPM Transmission

## 2024-01-29 IMAGING — CR DG CHEST 2V
2 series · 2 of 2 positions shown · non-contrast
Comparison: 10/19/2020

CLINICAL DATA: Chest pain

EXAM:
CHEST - 2 VIEW

[chest pa]
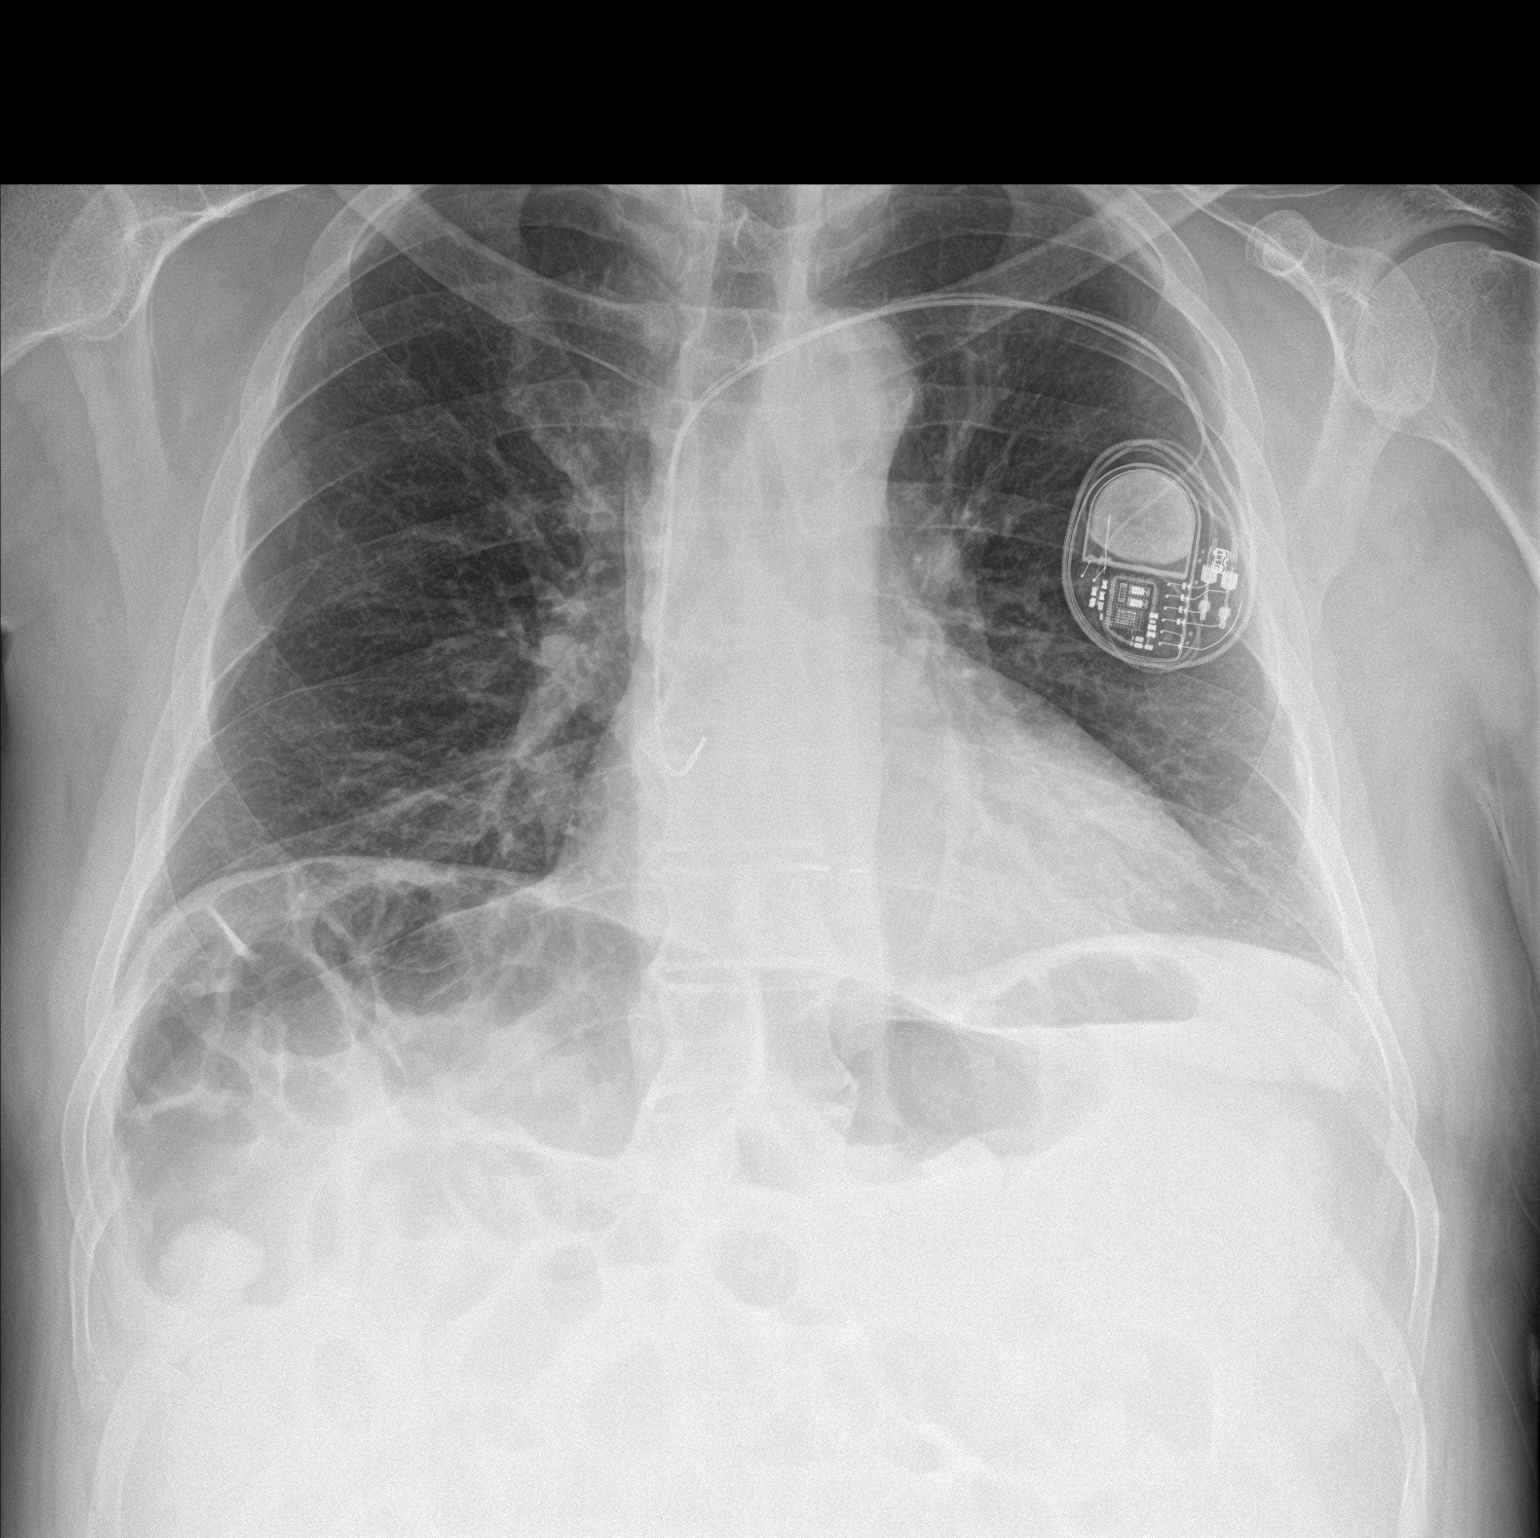

[chest lat]
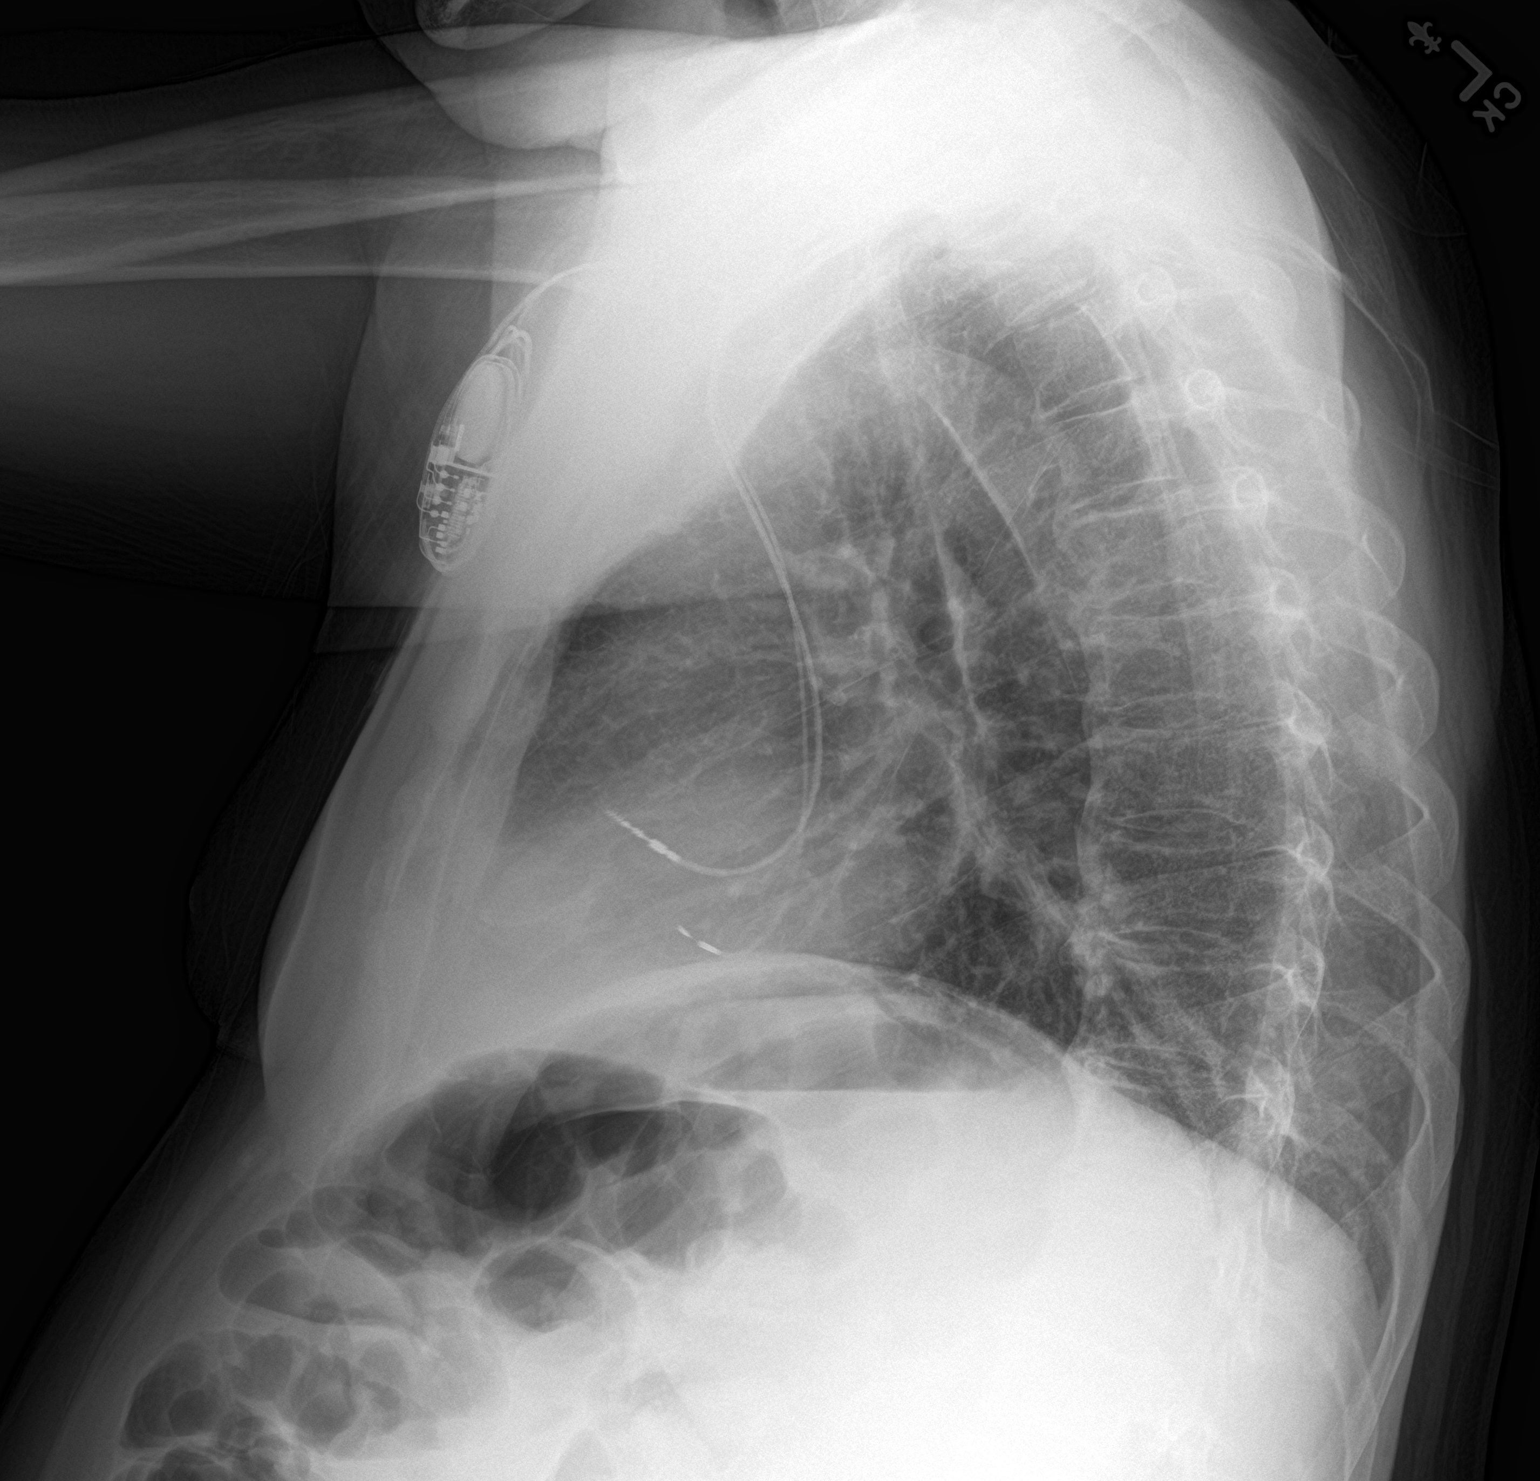

[2 of 2 positions shown; findings below may reference images not displayed]

FINDINGS: Cardiac shadow is within normal limits. Pacing device is again seen.
Lungs are well aerated bilaterally. No focal infiltrate or effusion
is seen. No bony abnormality is noted.
IMPRESSION: No active cardiopulmonary disease.

## 2024-03-24 ENCOUNTER — Ambulatory Visit: Payer: Self-pay

## 2024-04-07 ENCOUNTER — Ambulatory Visit: Payer: Self-pay

## 2024-04-07 DIAGNOSIS — I255 Ischemic cardiomyopathy: Secondary | ICD-10-CM | POA: Diagnosis not present

## 2024-04-08 LAB — CUP PACEART REMOTE DEVICE CHECK
Battery Remaining Longevity: 74 mo
Battery Voltage: 2.97 V
Brady Statistic AP VP Percent: 0.25 %
Brady Statistic AP VS Percent: 92.56 %
Brady Statistic AS VP Percent: 0 %
Brady Statistic AS VS Percent: 7.19 %
Brady Statistic RA Percent Paced: 92.83 %
Brady Statistic RV Percent Paced: 0.26 %
Date Time Interrogation Session: 20251201020620
Implantable Lead Connection Status: 753985
Implantable Lead Connection Status: 753985
Implantable Lead Implant Date: 20181022
Implantable Lead Implant Date: 20181022
Implantable Lead Location: 753859
Implantable Lead Location: 753860
Implantable Lead Model: 3830
Implantable Lead Model: 5076
Implantable Pulse Generator Implant Date: 20181022
Lead Channel Impedance Value: 285 Ohm
Lead Channel Impedance Value: 304 Ohm
Lead Channel Impedance Value: 418 Ohm
Lead Channel Impedance Value: 456 Ohm
Lead Channel Pacing Threshold Amplitude: 0.625 V
Lead Channel Pacing Threshold Amplitude: 1 V
Lead Channel Pacing Threshold Pulse Width: 0.4 ms
Lead Channel Pacing Threshold Pulse Width: 0.4 ms
Lead Channel Sensing Intrinsic Amplitude: 10.125 mV
Lead Channel Sensing Intrinsic Amplitude: 10.125 mV
Lead Channel Sensing Intrinsic Amplitude: 2.75 mV
Lead Channel Sensing Intrinsic Amplitude: 2.75 mV
Lead Channel Setting Pacing Amplitude: 1.5 V
Lead Channel Setting Pacing Amplitude: 2.5 V
Lead Channel Setting Pacing Pulse Width: 1 ms
Lead Channel Setting Sensing Sensitivity: 1.2 mV
Zone Setting Status: 755011
Zone Setting Status: 755011

## 2024-04-10 ENCOUNTER — Ambulatory Visit: Payer: Self-pay | Admitting: Internal Medicine

## 2024-04-10 NOTE — Progress Notes (Signed)
 Remote PPM Transmission

## 2024-06-23 ENCOUNTER — Ambulatory Visit: Payer: Self-pay

## 2024-07-07 ENCOUNTER — Ambulatory Visit: Payer: Self-pay

## 2024-09-22 ENCOUNTER — Ambulatory Visit: Payer: Self-pay

## 2024-10-06 ENCOUNTER — Ambulatory Visit: Payer: Self-pay

## 2024-12-22 ENCOUNTER — Ambulatory Visit: Payer: Self-pay

## 2025-01-05 ENCOUNTER — Ambulatory Visit: Payer: Self-pay

## 2025-03-23 ENCOUNTER — Ambulatory Visit: Payer: Self-pay

## 2025-04-06 ENCOUNTER — Ambulatory Visit: Payer: Self-pay

## 2025-06-22 ENCOUNTER — Ambulatory Visit: Payer: Self-pay

## 2025-07-06 ENCOUNTER — Ambulatory Visit: Payer: Self-pay

## 2025-09-21 ENCOUNTER — Ambulatory Visit: Payer: Self-pay
